# Patient Record
Sex: Female | Born: 1975 | Race: Black or African American | Hispanic: No | Marital: Single | Smoking: Former smoker
Health system: Southern US, Community
[De-identification: ages and names within clinical notes are randomized; demographics above are authoritative.]

## PROBLEM LIST (undated history)

## (undated) DIAGNOSIS — F319 Bipolar disorder, unspecified: Secondary | ICD-10-CM

## (undated) DIAGNOSIS — F419 Anxiety disorder, unspecified: Secondary | ICD-10-CM

## (undated) DIAGNOSIS — Z5189 Encounter for other specified aftercare: Secondary | ICD-10-CM

## (undated) DIAGNOSIS — IMO0001 Reserved for inherently not codable concepts without codable children: Secondary | ICD-10-CM

## (undated) DIAGNOSIS — Z8709 Personal history of other diseases of the respiratory system: Secondary | ICD-10-CM

## (undated) DIAGNOSIS — R7303 Prediabetes: Secondary | ICD-10-CM

## (undated) DIAGNOSIS — G43909 Migraine, unspecified, not intractable, without status migrainosus: Secondary | ICD-10-CM

## (undated) DIAGNOSIS — I10 Essential (primary) hypertension: Secondary | ICD-10-CM

## (undated) DIAGNOSIS — F32A Depression, unspecified: Secondary | ICD-10-CM

## (undated) DIAGNOSIS — D649 Anemia, unspecified: Secondary | ICD-10-CM

## (undated) DIAGNOSIS — J45909 Unspecified asthma, uncomplicated: Secondary | ICD-10-CM

## (undated) DIAGNOSIS — K219 Gastro-esophageal reflux disease without esophagitis: Secondary | ICD-10-CM

## (undated) DIAGNOSIS — R600 Localized edema: Secondary | ICD-10-CM

## (undated) DIAGNOSIS — N200 Calculus of kidney: Secondary | ICD-10-CM

## (undated) DIAGNOSIS — F329 Major depressive disorder, single episode, unspecified: Secondary | ICD-10-CM

## (undated) DIAGNOSIS — R079 Chest pain, unspecified: Secondary | ICD-10-CM

## (undated) DIAGNOSIS — F209 Schizophrenia, unspecified: Secondary | ICD-10-CM

## (undated) DIAGNOSIS — R0602 Shortness of breath: Secondary | ICD-10-CM

## (undated) HISTORY — DX: Prediabetes: R73.03

## (undated) HISTORY — PX: ENDOMETRIAL ABLATION: SHX621

## (undated) HISTORY — DX: Shortness of breath: R06.02

## (undated) HISTORY — DX: Unspecified asthma, uncomplicated: J45.909

## (undated) HISTORY — PX: FRACTURE SURGERY: SHX138

## (undated) HISTORY — DX: Anemia, unspecified: D64.9

## (undated) HISTORY — DX: Localized edema: R60.0

## (undated) HISTORY — PX: OTHER SURGICAL HISTORY: SHX169

## (undated) HISTORY — DX: Schizophrenia, unspecified: F20.9

## (undated) HISTORY — DX: Chest pain, unspecified: R07.9

---
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
## 1997-07-21 ENCOUNTER — Ambulatory Visit (HOSPITAL_COMMUNITY): Admission: RE | Admit: 1997-07-21 | Discharge: 1997-07-21 | Payer: Self-pay | Admitting: Obstetrics

## 1997-10-06 ENCOUNTER — Ambulatory Visit (HOSPITAL_COMMUNITY): Admission: RE | Admit: 1997-10-06 | Discharge: 1997-10-06 | Payer: Self-pay | Admitting: Obstetrics

## 1997-11-25 ENCOUNTER — Inpatient Hospital Stay (HOSPITAL_COMMUNITY): Admission: AD | Admit: 1997-11-25 | Discharge: 1997-11-25 | Payer: Self-pay | Admitting: Obstetrics

## 1997-12-16 ENCOUNTER — Inpatient Hospital Stay (HOSPITAL_COMMUNITY): Admission: AD | Admit: 1997-12-16 | Discharge: 1997-12-16 | Payer: Self-pay | Admitting: Obstetrics

## 1997-12-28 ENCOUNTER — Ambulatory Visit (HOSPITAL_COMMUNITY): Admission: RE | Admit: 1997-12-28 | Discharge: 1997-12-28 | Payer: Self-pay | Admitting: Obstetrics

## 1998-01-11 ENCOUNTER — Inpatient Hospital Stay (HOSPITAL_COMMUNITY): Admission: AD | Admit: 1998-01-11 | Discharge: 1998-01-11 | Payer: Self-pay | Admitting: Obstetrics

## 1998-02-10 ENCOUNTER — Inpatient Hospital Stay (HOSPITAL_COMMUNITY): Admission: AD | Admit: 1998-02-10 | Discharge: 1998-02-10 | Payer: Self-pay | Admitting: Obstetrics

## 1998-03-10 ENCOUNTER — Inpatient Hospital Stay (HOSPITAL_COMMUNITY): Admission: AD | Admit: 1998-03-10 | Discharge: 1998-03-10 | Payer: Self-pay | Admitting: Obstetrics

## 1998-03-18 ENCOUNTER — Inpatient Hospital Stay (HOSPITAL_COMMUNITY): Admission: AD | Admit: 1998-03-18 | Discharge: 1998-03-18 | Payer: Self-pay | Admitting: Obstetrics

## 1998-03-20 ENCOUNTER — Inpatient Hospital Stay (HOSPITAL_COMMUNITY): Admission: AD | Admit: 1998-03-20 | Discharge: 1998-03-22 | Payer: Self-pay | Admitting: Obstetrics

## 1998-09-01 ENCOUNTER — Inpatient Hospital Stay (HOSPITAL_COMMUNITY): Admission: AD | Admit: 1998-09-01 | Discharge: 1998-09-01 | Payer: Self-pay | Admitting: Obstetrics

## 1998-09-02 ENCOUNTER — Inpatient Hospital Stay (HOSPITAL_COMMUNITY): Admission: AD | Admit: 1998-09-02 | Discharge: 1998-09-02 | Payer: Self-pay | Admitting: Obstetrics

## 1998-11-12 ENCOUNTER — Inpatient Hospital Stay (HOSPITAL_COMMUNITY): Admission: EM | Admit: 1998-11-12 | Discharge: 1998-11-12 | Payer: Self-pay | Admitting: Obstetrics

## 1998-11-27 ENCOUNTER — Emergency Department (HOSPITAL_COMMUNITY): Admission: EM | Admit: 1998-11-27 | Discharge: 1998-11-27 | Payer: Self-pay | Admitting: Emergency Medicine

## 1998-12-06 ENCOUNTER — Inpatient Hospital Stay (HOSPITAL_COMMUNITY): Admission: AD | Admit: 1998-12-06 | Discharge: 1998-12-06 | Payer: Self-pay | Admitting: Obstetrics

## 1999-02-03 ENCOUNTER — Encounter: Payer: Self-pay | Admitting: Emergency Medicine

## 1999-02-03 ENCOUNTER — Emergency Department (HOSPITAL_COMMUNITY): Admission: EM | Admit: 1999-02-03 | Discharge: 1999-02-03 | Payer: Self-pay | Admitting: Emergency Medicine

## 1999-03-12 ENCOUNTER — Inpatient Hospital Stay (HOSPITAL_COMMUNITY): Admission: AD | Admit: 1999-03-12 | Discharge: 1999-03-12 | Payer: Self-pay | Admitting: *Deleted

## 2000-10-09 ENCOUNTER — Inpatient Hospital Stay (HOSPITAL_COMMUNITY): Admission: AD | Admit: 2000-10-09 | Discharge: 2000-10-09 | Payer: Self-pay | Admitting: *Deleted

## 2000-10-25 ENCOUNTER — Emergency Department (HOSPITAL_COMMUNITY): Admission: EM | Admit: 2000-10-25 | Discharge: 2000-10-25 | Payer: Self-pay | Admitting: Emergency Medicine

## 2001-01-12 ENCOUNTER — Emergency Department (HOSPITAL_COMMUNITY): Admission: EM | Admit: 2001-01-12 | Discharge: 2001-01-12 | Payer: Self-pay | Admitting: Emergency Medicine

## 2001-04-04 ENCOUNTER — Other Ambulatory Visit: Admission: RE | Admit: 2001-04-04 | Discharge: 2001-04-04 | Payer: Self-pay | Admitting: Obstetrics

## 2001-04-04 ENCOUNTER — Encounter: Admission: RE | Admit: 2001-04-04 | Discharge: 2001-04-04 | Payer: Self-pay | Admitting: Obstetrics

## 2001-04-11 ENCOUNTER — Ambulatory Visit (HOSPITAL_COMMUNITY): Admission: RE | Admit: 2001-04-11 | Discharge: 2001-04-11 | Payer: Self-pay

## 2001-05-07 ENCOUNTER — Emergency Department (HOSPITAL_COMMUNITY): Admission: EM | Admit: 2001-05-07 | Discharge: 2001-05-07 | Payer: Self-pay | Admitting: Emergency Medicine

## 2001-05-07 ENCOUNTER — Encounter: Payer: Self-pay | Admitting: Emergency Medicine

## 2001-06-20 ENCOUNTER — Emergency Department (HOSPITAL_COMMUNITY): Admission: EM | Admit: 2001-06-20 | Discharge: 2001-06-21 | Payer: Self-pay | Admitting: Emergency Medicine

## 2001-06-21 ENCOUNTER — Encounter: Payer: Self-pay | Admitting: Emergency Medicine

## 2001-07-12 ENCOUNTER — Encounter: Admission: RE | Admit: 2001-07-12 | Discharge: 2001-07-12 | Payer: Self-pay | Admitting: Family Medicine

## 2001-07-22 ENCOUNTER — Encounter: Payer: Self-pay | Admitting: Emergency Medicine

## 2001-07-22 ENCOUNTER — Emergency Department (HOSPITAL_COMMUNITY): Admission: EM | Admit: 2001-07-22 | Discharge: 2001-07-22 | Payer: Self-pay | Admitting: Emergency Medicine

## 2001-08-07 ENCOUNTER — Encounter: Admission: RE | Admit: 2001-08-07 | Discharge: 2001-08-07 | Payer: Self-pay | Admitting: Family Medicine

## 2001-08-27 ENCOUNTER — Encounter: Admission: RE | Admit: 2001-08-27 | Discharge: 2001-09-30 | Payer: Self-pay | Admitting: Orthopedic Surgery

## 2001-10-08 ENCOUNTER — Inpatient Hospital Stay (HOSPITAL_COMMUNITY): Admission: AD | Admit: 2001-10-08 | Discharge: 2001-10-08 | Payer: Self-pay | Admitting: *Deleted

## 2001-10-24 ENCOUNTER — Emergency Department (HOSPITAL_COMMUNITY): Admission: EM | Admit: 2001-10-24 | Discharge: 2001-10-24 | Payer: Self-pay | Admitting: Emergency Medicine

## 2001-10-24 ENCOUNTER — Encounter: Admission: RE | Admit: 2001-10-24 | Discharge: 2001-10-24 | Payer: Self-pay | Admitting: Family Medicine

## 2001-10-31 ENCOUNTER — Encounter: Admission: RE | Admit: 2001-10-31 | Discharge: 2001-10-31 | Payer: Self-pay | Admitting: Family Medicine

## 2001-10-31 ENCOUNTER — Other Ambulatory Visit: Admission: RE | Admit: 2001-10-31 | Discharge: 2001-10-31 | Payer: Self-pay | Admitting: Family Medicine

## 2001-11-01 ENCOUNTER — Inpatient Hospital Stay: Admission: AD | Admit: 2001-11-01 | Discharge: 2001-11-01 | Payer: Self-pay | Admitting: *Deleted

## 2001-11-05 ENCOUNTER — Ambulatory Visit (HOSPITAL_COMMUNITY): Admission: RE | Admit: 2001-11-05 | Discharge: 2001-11-05 | Payer: Self-pay | Admitting: Internal Medicine

## 2001-11-20 ENCOUNTER — Encounter: Admission: RE | Admit: 2001-11-20 | Discharge: 2001-11-20 | Payer: Self-pay | Admitting: Family Medicine

## 2001-11-24 ENCOUNTER — Inpatient Hospital Stay (HOSPITAL_COMMUNITY): Admission: AD | Admit: 2001-11-24 | Discharge: 2001-11-24 | Payer: Self-pay | Admitting: Obstetrics and Gynecology

## 2001-12-02 ENCOUNTER — Inpatient Hospital Stay (HOSPITAL_COMMUNITY): Admission: AD | Admit: 2001-12-02 | Discharge: 2001-12-02 | Payer: Self-pay | Admitting: *Deleted

## 2001-12-04 ENCOUNTER — Encounter: Admission: RE | Admit: 2001-12-04 | Discharge: 2001-12-04 | Payer: Self-pay | Admitting: Family Medicine

## 2001-12-24 ENCOUNTER — Inpatient Hospital Stay (HOSPITAL_COMMUNITY): Admission: AD | Admit: 2001-12-24 | Discharge: 2001-12-24 | Payer: Self-pay | Admitting: *Deleted

## 2002-01-09 ENCOUNTER — Encounter: Admission: RE | Admit: 2002-01-09 | Discharge: 2002-01-09 | Payer: Self-pay | Admitting: Family Medicine

## 2002-01-09 ENCOUNTER — Ambulatory Visit (HOSPITAL_COMMUNITY): Admission: RE | Admit: 2002-01-09 | Discharge: 2002-01-09 | Payer: Self-pay | Admitting: Family Medicine

## 2002-02-11 ENCOUNTER — Encounter: Admission: RE | Admit: 2002-02-11 | Discharge: 2002-02-11 | Payer: Self-pay | Admitting: Family Medicine

## 2002-03-09 ENCOUNTER — Inpatient Hospital Stay (HOSPITAL_COMMUNITY): Admission: AD | Admit: 2002-03-09 | Discharge: 2002-03-09 | Payer: Self-pay | Admitting: Obstetrics and Gynecology

## 2002-03-18 ENCOUNTER — Encounter: Admission: RE | Admit: 2002-03-18 | Discharge: 2002-03-18 | Payer: Self-pay | Admitting: Sports Medicine

## 2002-04-04 ENCOUNTER — Encounter: Admission: RE | Admit: 2002-04-04 | Discharge: 2002-04-04 | Payer: Self-pay | Admitting: Family Medicine

## 2002-04-08 ENCOUNTER — Inpatient Hospital Stay (HOSPITAL_COMMUNITY): Admission: AD | Admit: 2002-04-08 | Discharge: 2002-04-08 | Payer: Self-pay | Admitting: *Deleted

## 2002-04-08 ENCOUNTER — Ambulatory Visit (HOSPITAL_COMMUNITY): Admission: RE | Admit: 2002-04-08 | Discharge: 2002-04-08 | Payer: Self-pay | Admitting: Emergency Medicine

## 2002-04-16 ENCOUNTER — Encounter: Admission: RE | Admit: 2002-04-16 | Discharge: 2002-04-16 | Payer: Self-pay | Admitting: Family Medicine

## 2002-04-28 ENCOUNTER — Encounter: Admission: RE | Admit: 2002-04-28 | Discharge: 2002-04-28 | Payer: Self-pay | Admitting: Family Medicine

## 2002-05-13 ENCOUNTER — Encounter: Admission: RE | Admit: 2002-05-13 | Discharge: 2002-05-13 | Payer: Self-pay | Admitting: Family Medicine

## 2002-05-13 ENCOUNTER — Encounter: Payer: Self-pay | Admitting: *Deleted

## 2002-05-13 ENCOUNTER — Inpatient Hospital Stay (HOSPITAL_COMMUNITY): Admission: AD | Admit: 2002-05-13 | Discharge: 2002-05-17 | Payer: Self-pay | Admitting: *Deleted

## 2002-05-23 ENCOUNTER — Encounter: Admission: RE | Admit: 2002-05-23 | Discharge: 2002-05-23 | Payer: Self-pay | Admitting: Family Medicine

## 2002-05-30 ENCOUNTER — Encounter: Admission: RE | Admit: 2002-05-30 | Discharge: 2002-05-30 | Payer: Self-pay | Admitting: Family Medicine

## 2002-06-03 ENCOUNTER — Encounter: Admission: RE | Admit: 2002-06-03 | Discharge: 2002-06-03 | Payer: Self-pay | Admitting: Sports Medicine

## 2002-06-07 ENCOUNTER — Inpatient Hospital Stay (HOSPITAL_COMMUNITY): Admission: AD | Admit: 2002-06-07 | Discharge: 2002-06-07 | Payer: Self-pay | Admitting: Family Medicine

## 2002-06-09 ENCOUNTER — Inpatient Hospital Stay (HOSPITAL_COMMUNITY): Admission: AD | Admit: 2002-06-09 | Discharge: 2002-06-09 | Payer: Self-pay | Admitting: *Deleted

## 2002-06-11 ENCOUNTER — Inpatient Hospital Stay (HOSPITAL_COMMUNITY): Admission: AD | Admit: 2002-06-11 | Discharge: 2002-06-11 | Payer: Self-pay | Admitting: Obstetrics and Gynecology

## 2002-06-11 ENCOUNTER — Encounter: Payer: Self-pay | Admitting: Obstetrics and Gynecology

## 2002-06-12 ENCOUNTER — Inpatient Hospital Stay (HOSPITAL_COMMUNITY): Admission: AD | Admit: 2002-06-12 | Discharge: 2002-06-14 | Payer: Self-pay | Admitting: Obstetrics and Gynecology

## 2002-07-21 ENCOUNTER — Encounter: Admission: RE | Admit: 2002-07-21 | Discharge: 2002-07-21 | Payer: Self-pay | Admitting: Family Medicine

## 2002-08-04 ENCOUNTER — Encounter: Admission: RE | Admit: 2002-08-04 | Discharge: 2002-08-04 | Payer: Self-pay | Admitting: *Deleted

## 2002-08-07 ENCOUNTER — Ambulatory Visit (HOSPITAL_COMMUNITY): Admission: RE | Admit: 2002-08-07 | Discharge: 2002-08-07 | Payer: Self-pay | Admitting: Sports Medicine

## 2002-08-07 ENCOUNTER — Encounter: Payer: Self-pay | Admitting: Sports Medicine

## 2002-08-11 ENCOUNTER — Encounter: Admission: RE | Admit: 2002-08-11 | Discharge: 2002-08-11 | Payer: Self-pay | Admitting: Family Medicine

## 2002-09-05 ENCOUNTER — Encounter: Admission: RE | Admit: 2002-09-05 | Discharge: 2002-09-05 | Payer: Self-pay | Admitting: Family Medicine

## 2002-09-17 ENCOUNTER — Emergency Department (HOSPITAL_COMMUNITY): Admission: AD | Admit: 2002-09-17 | Discharge: 2002-09-17 | Payer: Self-pay | Admitting: Emergency Medicine

## 2002-12-02 ENCOUNTER — Encounter: Admission: RE | Admit: 2002-12-02 | Discharge: 2002-12-02 | Payer: Self-pay | Admitting: Family Medicine

## 2002-12-05 ENCOUNTER — Ambulatory Visit (HOSPITAL_COMMUNITY): Admission: RE | Admit: 2002-12-05 | Discharge: 2002-12-05 | Payer: Self-pay | Admitting: *Deleted

## 2002-12-05 ENCOUNTER — Encounter: Payer: Self-pay | Admitting: *Deleted

## 2002-12-17 ENCOUNTER — Encounter: Admission: RE | Admit: 2002-12-17 | Discharge: 2002-12-17 | Payer: Self-pay | Admitting: Family Medicine

## 2002-12-25 ENCOUNTER — Encounter: Payer: Self-pay | Admitting: Internal Medicine

## 2002-12-25 ENCOUNTER — Ambulatory Visit (HOSPITAL_COMMUNITY): Admission: RE | Admit: 2002-12-25 | Discharge: 2002-12-25 | Payer: Self-pay | Admitting: Internal Medicine

## 2002-12-30 ENCOUNTER — Encounter: Admission: RE | Admit: 2002-12-30 | Discharge: 2002-12-30 | Payer: Self-pay | Admitting: Family Medicine

## 2002-12-31 ENCOUNTER — Encounter: Admission: RE | Admit: 2002-12-31 | Discharge: 2002-12-31 | Payer: Self-pay | Admitting: Family Medicine

## 2003-02-01 ENCOUNTER — Emergency Department (HOSPITAL_COMMUNITY): Admission: EM | Admit: 2003-02-01 | Discharge: 2003-02-01 | Payer: Self-pay | Admitting: Emergency Medicine

## 2003-06-17 ENCOUNTER — Emergency Department (HOSPITAL_COMMUNITY): Admission: AD | Admit: 2003-06-17 | Discharge: 2003-06-17 | Payer: Self-pay | Admitting: Family Medicine

## 2003-09-06 ENCOUNTER — Emergency Department (HOSPITAL_COMMUNITY): Admission: EM | Admit: 2003-09-06 | Discharge: 2003-09-06 | Payer: Self-pay | Admitting: Emergency Medicine

## 2003-09-07 ENCOUNTER — Ambulatory Visit (HOSPITAL_COMMUNITY): Admission: RE | Admit: 2003-09-07 | Discharge: 2003-09-07 | Payer: Self-pay | Admitting: Otolaryngology

## 2003-10-16 ENCOUNTER — Other Ambulatory Visit: Admission: RE | Admit: 2003-10-16 | Discharge: 2003-10-16 | Payer: Self-pay | Admitting: Family Medicine

## 2003-10-16 ENCOUNTER — Encounter: Admission: RE | Admit: 2003-10-16 | Discharge: 2003-10-16 | Payer: Self-pay | Admitting: Family Medicine

## 2003-11-16 ENCOUNTER — Encounter: Admission: RE | Admit: 2003-11-16 | Discharge: 2003-11-16 | Payer: Self-pay | Admitting: Family Medicine

## 2003-12-22 ENCOUNTER — Encounter: Admission: RE | Admit: 2003-12-22 | Discharge: 2003-12-22 | Payer: Self-pay | Admitting: Family Medicine

## 2003-12-24 ENCOUNTER — Encounter: Admission: RE | Admit: 2003-12-24 | Discharge: 2003-12-24 | Payer: Self-pay | Admitting: Family Medicine

## 2004-01-13 ENCOUNTER — Encounter: Admission: RE | Admit: 2004-01-13 | Discharge: 2004-01-13 | Payer: Self-pay | Admitting: Family Medicine

## 2004-04-02 ENCOUNTER — Emergency Department (HOSPITAL_COMMUNITY): Admission: EM | Admit: 2004-04-02 | Discharge: 2004-04-02 | Payer: Self-pay | Admitting: Emergency Medicine

## 2004-04-04 ENCOUNTER — Emergency Department (HOSPITAL_COMMUNITY): Admission: EM | Admit: 2004-04-04 | Discharge: 2004-04-05 | Payer: Self-pay | Admitting: Emergency Medicine

## 2004-05-13 ENCOUNTER — Ambulatory Visit: Payer: Self-pay | Admitting: Family Medicine

## 2004-05-15 ENCOUNTER — Emergency Department (HOSPITAL_COMMUNITY): Admission: EM | Admit: 2004-05-15 | Discharge: 2004-05-15 | Payer: Self-pay | Admitting: Emergency Medicine

## 2004-05-24 ENCOUNTER — Inpatient Hospital Stay (HOSPITAL_COMMUNITY): Admission: AD | Admit: 2004-05-24 | Discharge: 2004-05-24 | Payer: Self-pay | Admitting: *Deleted

## 2004-06-07 ENCOUNTER — Emergency Department (HOSPITAL_COMMUNITY): Admission: EM | Admit: 2004-06-07 | Discharge: 2004-06-07 | Payer: Self-pay | Admitting: Emergency Medicine

## 2004-06-08 ENCOUNTER — Ambulatory Visit: Payer: Self-pay | Admitting: Sports Medicine

## 2004-06-12 HISTORY — PX: TUBAL LIGATION: SHX77

## 2004-06-26 ENCOUNTER — Emergency Department (HOSPITAL_COMMUNITY): Admission: EM | Admit: 2004-06-26 | Discharge: 2004-06-26 | Payer: Self-pay | Admitting: Family Medicine

## 2004-06-29 ENCOUNTER — Ambulatory Visit: Payer: Self-pay | Admitting: Family Medicine

## 2004-07-14 ENCOUNTER — Ambulatory Visit: Payer: Self-pay | Admitting: Family Medicine

## 2004-07-26 ENCOUNTER — Ambulatory Visit: Payer: Self-pay | Admitting: Sports Medicine

## 2004-07-27 ENCOUNTER — Ambulatory Visit (HOSPITAL_COMMUNITY): Admission: RE | Admit: 2004-07-27 | Discharge: 2004-07-27 | Payer: Self-pay | Admitting: Family Medicine

## 2004-08-09 ENCOUNTER — Ambulatory Visit: Payer: Self-pay | Admitting: Family Medicine

## 2004-08-15 ENCOUNTER — Encounter: Admission: RE | Admit: 2004-08-15 | Discharge: 2004-11-13 | Payer: Self-pay | Admitting: Unknown Physician Specialty

## 2004-09-14 ENCOUNTER — Ambulatory Visit: Payer: Self-pay | Admitting: Specialist

## 2004-09-14 ENCOUNTER — Inpatient Hospital Stay (HOSPITAL_COMMUNITY): Admission: RE | Admit: 2004-09-14 | Discharge: 2004-09-20 | Payer: Self-pay | Admitting: Psychiatry

## 2004-09-14 ENCOUNTER — Ambulatory Visit: Payer: Self-pay | Admitting: Psychiatry

## 2004-09-17 ENCOUNTER — Encounter: Payer: Self-pay | Admitting: *Deleted

## 2004-10-06 ENCOUNTER — Ambulatory Visit: Payer: Self-pay | Admitting: Family Medicine

## 2004-10-13 ENCOUNTER — Ambulatory Visit: Payer: Self-pay | Admitting: Family Medicine

## 2004-10-17 ENCOUNTER — Ambulatory Visit (HOSPITAL_COMMUNITY): Admission: RE | Admit: 2004-10-17 | Discharge: 2004-10-17 | Payer: Self-pay | Admitting: *Deleted

## 2004-10-20 ENCOUNTER — Emergency Department (HOSPITAL_COMMUNITY): Admission: EM | Admit: 2004-10-20 | Discharge: 2004-10-20 | Payer: Self-pay | Admitting: Family Medicine

## 2004-10-27 ENCOUNTER — Inpatient Hospital Stay (HOSPITAL_COMMUNITY): Admission: AD | Admit: 2004-10-27 | Discharge: 2004-10-27 | Payer: Self-pay | Admitting: Obstetrics & Gynecology

## 2004-11-03 ENCOUNTER — Ambulatory Visit: Payer: Self-pay | Admitting: *Deleted

## 2004-11-23 ENCOUNTER — Ambulatory Visit: Payer: Self-pay | Admitting: *Deleted

## 2004-12-21 ENCOUNTER — Ambulatory Visit: Payer: Self-pay | Admitting: *Deleted

## 2004-12-22 ENCOUNTER — Ambulatory Visit (HOSPITAL_COMMUNITY): Admission: RE | Admit: 2004-12-22 | Discharge: 2004-12-22 | Payer: Self-pay | Admitting: *Deleted

## 2004-12-28 ENCOUNTER — Ambulatory Visit: Payer: Self-pay | Admitting: Obstetrics & Gynecology

## 2005-01-04 ENCOUNTER — Ambulatory Visit (HOSPITAL_COMMUNITY): Admission: RE | Admit: 2005-01-04 | Discharge: 2005-01-04 | Payer: Self-pay | Admitting: Obstetrics & Gynecology

## 2005-01-08 ENCOUNTER — Ambulatory Visit: Payer: Self-pay | Admitting: Certified Nurse Midwife

## 2005-01-08 ENCOUNTER — Ambulatory Visit: Payer: Self-pay | Admitting: *Deleted

## 2005-01-08 ENCOUNTER — Inpatient Hospital Stay (HOSPITAL_COMMUNITY): Admission: AD | Admit: 2005-01-08 | Discharge: 2005-01-08 | Payer: Self-pay | Admitting: Obstetrics and Gynecology

## 2005-01-18 ENCOUNTER — Ambulatory Visit: Payer: Self-pay | Admitting: Obstetrics & Gynecology

## 2005-01-25 ENCOUNTER — Ambulatory Visit: Payer: Self-pay | Admitting: Obstetrics & Gynecology

## 2005-01-31 ENCOUNTER — Ambulatory Visit (HOSPITAL_COMMUNITY): Admission: RE | Admit: 2005-01-31 | Discharge: 2005-01-31 | Payer: Self-pay | Admitting: Obstetrics and Gynecology

## 2005-02-01 ENCOUNTER — Ambulatory Visit: Payer: Self-pay | Admitting: Obstetrics & Gynecology

## 2005-02-08 ENCOUNTER — Ambulatory Visit: Payer: Self-pay | Admitting: *Deleted

## 2005-02-15 ENCOUNTER — Ambulatory Visit: Payer: Self-pay | Admitting: *Deleted

## 2005-02-22 ENCOUNTER — Ambulatory Visit: Payer: Self-pay | Admitting: Obstetrics & Gynecology

## 2005-03-01 ENCOUNTER — Ambulatory Visit (HOSPITAL_COMMUNITY): Admission: RE | Admit: 2005-03-01 | Discharge: 2005-03-01 | Payer: Self-pay | Admitting: Obstetrics and Gynecology

## 2005-03-01 ENCOUNTER — Ambulatory Visit: Payer: Self-pay | Admitting: Obstetrics & Gynecology

## 2005-03-08 ENCOUNTER — Ambulatory Visit: Payer: Self-pay | Admitting: *Deleted

## 2005-03-10 ENCOUNTER — Inpatient Hospital Stay (HOSPITAL_COMMUNITY): Admission: AD | Admit: 2005-03-10 | Discharge: 2005-03-13 | Payer: Self-pay | Admitting: *Deleted

## 2005-03-10 ENCOUNTER — Ambulatory Visit: Payer: Self-pay | Admitting: Certified Nurse Midwife

## 2005-04-03 ENCOUNTER — Emergency Department (HOSPITAL_COMMUNITY): Admission: EM | Admit: 2005-04-03 | Discharge: 2005-04-03 | Payer: Self-pay | Admitting: Family Medicine

## 2005-04-13 ENCOUNTER — Ambulatory Visit: Payer: Self-pay | Admitting: Family Medicine

## 2005-04-20 ENCOUNTER — Emergency Department (HOSPITAL_COMMUNITY): Admission: EM | Admit: 2005-04-20 | Discharge: 2005-04-20 | Payer: Self-pay | Admitting: Emergency Medicine

## 2005-05-22 ENCOUNTER — Ambulatory Visit: Payer: Self-pay | Admitting: Sports Medicine

## 2005-06-01 ENCOUNTER — Other Ambulatory Visit: Admission: RE | Admit: 2005-06-01 | Discharge: 2005-06-01 | Payer: Self-pay | Admitting: Family Medicine

## 2005-06-01 ENCOUNTER — Ambulatory Visit: Payer: Self-pay | Admitting: Family Medicine

## 2005-06-18 ENCOUNTER — Encounter (INDEPENDENT_AMBULATORY_CARE_PROVIDER_SITE_OTHER): Payer: Self-pay | Admitting: *Deleted

## 2005-06-18 LAB — CONVERTED CEMR LAB

## 2005-06-21 ENCOUNTER — Ambulatory Visit: Payer: Self-pay | Admitting: Family Medicine

## 2005-06-27 ENCOUNTER — Emergency Department (HOSPITAL_COMMUNITY): Admission: EM | Admit: 2005-06-27 | Discharge: 2005-06-28 | Payer: Self-pay | Admitting: Emergency Medicine

## 2005-09-21 ENCOUNTER — Ambulatory Visit: Payer: Self-pay | Admitting: Family Medicine

## 2005-09-25 ENCOUNTER — Ambulatory Visit: Payer: Self-pay | Admitting: Sports Medicine

## 2005-09-26 ENCOUNTER — Inpatient Hospital Stay (HOSPITAL_COMMUNITY): Admission: RE | Admit: 2005-09-26 | Discharge: 2005-10-01 | Payer: Self-pay | Admitting: *Deleted

## 2005-09-26 ENCOUNTER — Ambulatory Visit: Payer: Self-pay | Admitting: *Deleted

## 2005-10-20 ENCOUNTER — Ambulatory Visit: Payer: Self-pay | Admitting: Family Medicine

## 2005-12-25 ENCOUNTER — Ambulatory Visit: Payer: Self-pay | Admitting: Family Medicine

## 2005-12-29 ENCOUNTER — Ambulatory Visit (HOSPITAL_COMMUNITY): Admission: RE | Admit: 2005-12-29 | Discharge: 2005-12-29 | Payer: Self-pay | Admitting: Family Medicine

## 2005-12-29 ENCOUNTER — Ambulatory Visit: Payer: Self-pay | Admitting: Family Medicine

## 2006-02-20 ENCOUNTER — Ambulatory Visit: Payer: Self-pay | Admitting: Sports Medicine

## 2006-04-10 ENCOUNTER — Emergency Department (HOSPITAL_COMMUNITY): Admission: EM | Admit: 2006-04-10 | Discharge: 2006-04-10 | Payer: Self-pay | Admitting: Emergency Medicine

## 2006-04-15 ENCOUNTER — Emergency Department (HOSPITAL_COMMUNITY): Admission: EM | Admit: 2006-04-15 | Discharge: 2006-04-15 | Payer: Self-pay | Admitting: Emergency Medicine

## 2006-07-11 ENCOUNTER — Ambulatory Visit: Payer: Self-pay | Admitting: Family Medicine

## 2006-08-09 DIAGNOSIS — Z6841 Body Mass Index (BMI) 40.0 and over, adult: Secondary | ICD-10-CM

## 2006-08-09 DIAGNOSIS — R51 Headache: Secondary | ICD-10-CM | POA: Insufficient documentation

## 2006-08-09 DIAGNOSIS — F339 Major depressive disorder, recurrent, unspecified: Secondary | ICD-10-CM | POA: Insufficient documentation

## 2006-08-09 DIAGNOSIS — R519 Headache, unspecified: Secondary | ICD-10-CM | POA: Insufficient documentation

## 2006-08-09 HISTORY — DX: Morbid (severe) obesity due to excess calories: E66.01

## 2006-08-10 ENCOUNTER — Encounter (INDEPENDENT_AMBULATORY_CARE_PROVIDER_SITE_OTHER): Payer: Self-pay | Admitting: *Deleted

## 2006-08-11 ENCOUNTER — Emergency Department (HOSPITAL_COMMUNITY): Admission: EM | Admit: 2006-08-11 | Discharge: 2006-08-12 | Payer: Self-pay | Admitting: Emergency Medicine

## 2006-08-17 ENCOUNTER — Ambulatory Visit: Payer: Self-pay | Admitting: Sports Medicine

## 2006-08-17 DIAGNOSIS — J329 Chronic sinusitis, unspecified: Secondary | ICD-10-CM | POA: Insufficient documentation

## 2006-08-17 DIAGNOSIS — E1159 Type 2 diabetes mellitus with other circulatory complications: Secondary | ICD-10-CM | POA: Insufficient documentation

## 2006-08-17 DIAGNOSIS — I10 Essential (primary) hypertension: Secondary | ICD-10-CM | POA: Insufficient documentation

## 2006-09-20 ENCOUNTER — Emergency Department (HOSPITAL_COMMUNITY): Admission: EM | Admit: 2006-09-20 | Discharge: 2006-09-20 | Payer: Self-pay | Admitting: Emergency Medicine

## 2006-10-03 ENCOUNTER — Telehealth: Payer: Self-pay | Admitting: *Deleted

## 2006-10-08 ENCOUNTER — Ambulatory Visit: Payer: Self-pay | Admitting: Family Medicine

## 2006-10-08 DIAGNOSIS — L219 Seborrheic dermatitis, unspecified: Secondary | ICD-10-CM | POA: Insufficient documentation

## 2006-11-12 ENCOUNTER — Telehealth: Payer: Self-pay | Admitting: *Deleted

## 2006-11-15 ENCOUNTER — Encounter (INDEPENDENT_AMBULATORY_CARE_PROVIDER_SITE_OTHER): Payer: Self-pay | Admitting: Family Medicine

## 2006-11-15 ENCOUNTER — Other Ambulatory Visit: Admission: RE | Admit: 2006-11-15 | Discharge: 2006-11-15 | Payer: Self-pay | Admitting: Family Medicine

## 2006-11-15 ENCOUNTER — Ambulatory Visit: Payer: Self-pay | Admitting: Family Medicine

## 2006-11-15 LAB — CONVERTED CEMR LAB
Bilirubin Urine: NEGATIVE
Blood in Urine, dipstick: NEGATIVE
Chlamydia, DNA Probe: NEGATIVE
Glucose, Urine, Semiquant: NEGATIVE
KOH Prep: NEGATIVE
Nitrite: NEGATIVE
Protein, U semiquant: NEGATIVE
Specific Gravity, Urine: >=1.03
Urobilinogen, UA: 0.2
WBC Urine, dipstick: NEGATIVE
Whiff Test: NEGATIVE
pH: 6

## 2006-11-19 ENCOUNTER — Telehealth (INDEPENDENT_AMBULATORY_CARE_PROVIDER_SITE_OTHER): Payer: Self-pay | Admitting: Family Medicine

## 2006-11-22 ENCOUNTER — Encounter (INDEPENDENT_AMBULATORY_CARE_PROVIDER_SITE_OTHER): Payer: Self-pay | Admitting: Family Medicine

## 2007-01-01 ENCOUNTER — Encounter (INDEPENDENT_AMBULATORY_CARE_PROVIDER_SITE_OTHER): Payer: Self-pay | Admitting: Family Medicine

## 2007-01-21 ENCOUNTER — Telehealth (INDEPENDENT_AMBULATORY_CARE_PROVIDER_SITE_OTHER): Payer: Self-pay | Admitting: *Deleted

## 2007-02-01 ENCOUNTER — Telehealth: Payer: Self-pay | Admitting: *Deleted

## 2007-02-01 ENCOUNTER — Ambulatory Visit: Payer: Self-pay | Admitting: Family Medicine

## 2007-02-01 ENCOUNTER — Encounter: Payer: Self-pay | Admitting: *Deleted

## 2007-04-16 ENCOUNTER — Ambulatory Visit: Payer: Self-pay | Admitting: Family Medicine

## 2007-05-18 ENCOUNTER — Telehealth (INDEPENDENT_AMBULATORY_CARE_PROVIDER_SITE_OTHER): Payer: Self-pay | Admitting: Family Medicine

## 2007-05-24 ENCOUNTER — Ambulatory Visit: Payer: Self-pay | Admitting: Family Medicine

## 2007-05-27 ENCOUNTER — Telehealth (INDEPENDENT_AMBULATORY_CARE_PROVIDER_SITE_OTHER): Payer: Self-pay | Admitting: Family Medicine

## 2007-06-10 ENCOUNTER — Telehealth (INDEPENDENT_AMBULATORY_CARE_PROVIDER_SITE_OTHER): Payer: Self-pay | Admitting: Family Medicine

## 2007-07-16 ENCOUNTER — Telehealth (INDEPENDENT_AMBULATORY_CARE_PROVIDER_SITE_OTHER): Payer: Self-pay | Admitting: Family Medicine

## 2007-09-27 ENCOUNTER — Encounter (INDEPENDENT_AMBULATORY_CARE_PROVIDER_SITE_OTHER): Payer: Self-pay | Admitting: Family Medicine

## 2007-09-27 ENCOUNTER — Ambulatory Visit: Payer: Self-pay | Admitting: Family Medicine

## 2007-09-27 LAB — CONVERTED CEMR LAB
BUN: 10 mg/dL (ref 6–23)
Creatinine, Ser: 0.91 mg/dL (ref 0.40–1.20)
Glucose, Bld: 138 mg/dL — ABNORMAL HIGH (ref 70–99)
Potassium: 4.3 meq/L (ref 3.5–5.3)
TSH: 1.06 microintl units/mL (ref 0.350–5.50)

## 2007-09-29 ENCOUNTER — Telehealth (INDEPENDENT_AMBULATORY_CARE_PROVIDER_SITE_OTHER): Payer: Self-pay | Admitting: Family Medicine

## 2007-10-01 ENCOUNTER — Encounter: Payer: Self-pay | Admitting: *Deleted

## 2007-10-02 ENCOUNTER — Encounter: Admission: RE | Admit: 2007-10-02 | Discharge: 2007-10-02 | Payer: Self-pay | Admitting: Family Medicine

## 2007-10-04 ENCOUNTER — Telehealth (INDEPENDENT_AMBULATORY_CARE_PROVIDER_SITE_OTHER): Payer: Self-pay | Admitting: Family Medicine

## 2007-10-11 ENCOUNTER — Telehealth: Payer: Self-pay | Admitting: *Deleted

## 2007-10-11 ENCOUNTER — Ambulatory Visit: Payer: Self-pay | Admitting: Family Medicine

## 2007-11-14 ENCOUNTER — Telehealth: Payer: Self-pay | Admitting: *Deleted

## 2007-11-21 ENCOUNTER — Ambulatory Visit: Payer: Self-pay | Admitting: Family Medicine

## 2007-11-21 ENCOUNTER — Encounter (INDEPENDENT_AMBULATORY_CARE_PROVIDER_SITE_OTHER): Payer: Self-pay | Admitting: *Deleted

## 2007-11-21 ENCOUNTER — Other Ambulatory Visit: Admission: RE | Admit: 2007-11-21 | Discharge: 2007-11-21 | Payer: Self-pay | Admitting: Family Medicine

## 2007-11-21 LAB — CONVERTED CEMR LAB
GC Probe Amp, Genital: NEGATIVE
HCT: 36 % (ref 36.0–46.0)
Hemoglobin: 10.9 g/dL — ABNORMAL LOW (ref 12.0–15.0)
MCV: 81.3 fL (ref 78.0–100.0)
RDW: 13.9 % (ref 11.5–15.5)
WBC: 11.1 10*3/uL — ABNORMAL HIGH (ref 4.0–10.5)

## 2007-11-25 ENCOUNTER — Ambulatory Visit (HOSPITAL_COMMUNITY): Admission: RE | Admit: 2007-11-25 | Discharge: 2007-11-25 | Payer: Self-pay | Admitting: *Deleted

## 2007-11-25 ENCOUNTER — Encounter (INDEPENDENT_AMBULATORY_CARE_PROVIDER_SITE_OTHER): Payer: Self-pay | Admitting: *Deleted

## 2007-11-28 ENCOUNTER — Telehealth (INDEPENDENT_AMBULATORY_CARE_PROVIDER_SITE_OTHER): Payer: Self-pay | Admitting: *Deleted

## 2007-12-04 ENCOUNTER — Telehealth (INDEPENDENT_AMBULATORY_CARE_PROVIDER_SITE_OTHER): Payer: Self-pay | Admitting: *Deleted

## 2007-12-05 ENCOUNTER — Telehealth (INDEPENDENT_AMBULATORY_CARE_PROVIDER_SITE_OTHER): Payer: Self-pay | Admitting: Family Medicine

## 2007-12-16 ENCOUNTER — Telehealth: Payer: Self-pay | Admitting: *Deleted

## 2007-12-16 ENCOUNTER — Ambulatory Visit: Payer: Self-pay | Admitting: Family Medicine

## 2008-01-05 ENCOUNTER — Emergency Department (HOSPITAL_COMMUNITY): Admission: EM | Admit: 2008-01-05 | Discharge: 2008-01-05 | Payer: Self-pay | Admitting: Emergency Medicine

## 2008-01-24 ENCOUNTER — Telehealth: Payer: Self-pay | Admitting: *Deleted

## 2008-01-24 ENCOUNTER — Encounter: Payer: Self-pay | Admitting: Family Medicine

## 2008-01-24 ENCOUNTER — Ambulatory Visit: Payer: Self-pay | Admitting: Family Medicine

## 2008-01-24 DIAGNOSIS — K219 Gastro-esophageal reflux disease without esophagitis: Secondary | ICD-10-CM | POA: Insufficient documentation

## 2008-01-24 LAB — CONVERTED CEMR LAB: Whiff Test: NEGATIVE

## 2008-01-26 ENCOUNTER — Encounter (INDEPENDENT_AMBULATORY_CARE_PROVIDER_SITE_OTHER): Payer: Self-pay | Admitting: *Deleted

## 2008-01-26 LAB — CONVERTED CEMR LAB: GC Probe Amp, Genital: NEGATIVE

## 2008-02-12 ENCOUNTER — Encounter (INDEPENDENT_AMBULATORY_CARE_PROVIDER_SITE_OTHER): Payer: Self-pay | Admitting: Family Medicine

## 2008-02-12 ENCOUNTER — Telehealth: Payer: Self-pay | Admitting: *Deleted

## 2008-02-12 ENCOUNTER — Ambulatory Visit: Payer: Self-pay | Admitting: Family Medicine

## 2008-02-13 LAB — CONVERTED CEMR LAB
ALT: 11 units/L (ref 0–35)
Alkaline Phosphatase: 49 units/L (ref 39–117)
Sodium: 143 meq/L (ref 135–145)
Total Bilirubin: 0.5 mg/dL (ref 0.3–1.2)
Total Protein: 7.3 g/dL (ref 6.0–8.3)

## 2008-04-02 ENCOUNTER — Telehealth: Payer: Self-pay | Admitting: *Deleted

## 2008-04-15 ENCOUNTER — Encounter (INDEPENDENT_AMBULATORY_CARE_PROVIDER_SITE_OTHER): Payer: Self-pay | Admitting: Family Medicine

## 2008-04-15 ENCOUNTER — Ambulatory Visit: Payer: Self-pay | Admitting: Family Medicine

## 2008-04-20 ENCOUNTER — Telehealth: Payer: Self-pay | Admitting: *Deleted

## 2008-04-20 ENCOUNTER — Telehealth (INDEPENDENT_AMBULATORY_CARE_PROVIDER_SITE_OTHER): Payer: Self-pay | Admitting: Family Medicine

## 2008-04-20 LAB — CONVERTED CEMR LAB
BUN: 9 mg/dL (ref 6–23)
CO2: 27 meq/L (ref 19–32)
Chloride: 101 meq/L (ref 96–112)
Glucose, Bld: 118 mg/dL — ABNORMAL HIGH (ref 70–99)
LDL Cholesterol: 98 mg/dL (ref 0–99)
Potassium: 4.2 meq/L (ref 3.5–5.3)
VLDL: 21 mg/dL (ref 0–40)

## 2008-04-24 ENCOUNTER — Encounter (INDEPENDENT_AMBULATORY_CARE_PROVIDER_SITE_OTHER): Payer: Self-pay | Admitting: Family Medicine

## 2008-04-24 ENCOUNTER — Ambulatory Visit: Payer: Self-pay | Admitting: Family Medicine

## 2008-04-25 ENCOUNTER — Telehealth (INDEPENDENT_AMBULATORY_CARE_PROVIDER_SITE_OTHER): Payer: Self-pay | Admitting: Family Medicine

## 2008-05-11 ENCOUNTER — Encounter (INDEPENDENT_AMBULATORY_CARE_PROVIDER_SITE_OTHER): Payer: Self-pay | Admitting: Family Medicine

## 2008-06-09 ENCOUNTER — Telehealth: Payer: Self-pay | Admitting: Family Medicine

## 2008-06-29 ENCOUNTER — Telehealth: Payer: Self-pay | Admitting: *Deleted

## 2008-07-19 ENCOUNTER — Telehealth (INDEPENDENT_AMBULATORY_CARE_PROVIDER_SITE_OTHER): Payer: Self-pay | Admitting: Family Medicine

## 2008-08-07 ENCOUNTER — Encounter (INDEPENDENT_AMBULATORY_CARE_PROVIDER_SITE_OTHER): Payer: Self-pay | Admitting: Family Medicine

## 2008-08-07 ENCOUNTER — Ambulatory Visit: Payer: Self-pay | Admitting: Family Medicine

## 2008-08-07 ENCOUNTER — Telehealth (INDEPENDENT_AMBULATORY_CARE_PROVIDER_SITE_OTHER): Payer: Self-pay | Admitting: *Deleted

## 2008-08-07 LAB — CONVERTED CEMR LAB
ALT: 12 units/L (ref 0–35)
AST: 16 units/L (ref 0–37)
Alkaline Phosphatase: 53 units/L (ref 39–117)
Beta hcg, urine, semiquantitative: NEGATIVE
Chloride: 104 meq/L (ref 96–112)
Creatinine, Ser: 0.73 mg/dL (ref 0.40–1.20)
HCT: 29.1 % — ABNORMAL LOW (ref 36.0–46.0)
MCHC: 26.8 g/dL — ABNORMAL LOW (ref 30.0–36.0)
MCV: 74.6 fL — ABNORMAL LOW (ref 78.0–100.0)
Platelets: 486 10*3/uL — ABNORMAL HIGH (ref 150–400)
RDW: 16.5 % — ABNORMAL HIGH (ref 11.5–15.5)
Total Bilirubin: 0.4 mg/dL (ref 0.3–1.2)

## 2008-08-08 ENCOUNTER — Ambulatory Visit: Payer: Self-pay | Admitting: Family Medicine

## 2008-08-08 ENCOUNTER — Inpatient Hospital Stay (HOSPITAL_COMMUNITY): Admission: EM | Admit: 2008-08-08 | Discharge: 2008-08-10 | Payer: Self-pay | Admitting: Pediatrics

## 2008-08-08 ENCOUNTER — Telehealth (INDEPENDENT_AMBULATORY_CARE_PROVIDER_SITE_OTHER): Payer: Self-pay | Admitting: Family Medicine

## 2008-08-08 ENCOUNTER — Encounter: Payer: Self-pay | Admitting: Family Medicine

## 2008-08-10 ENCOUNTER — Encounter: Payer: Self-pay | Admitting: Family Medicine

## 2008-08-10 ENCOUNTER — Telehealth: Payer: Self-pay | Admitting: Family Medicine

## 2008-08-11 ENCOUNTER — Encounter (INDEPENDENT_AMBULATORY_CARE_PROVIDER_SITE_OTHER): Payer: Self-pay | Admitting: Family Medicine

## 2008-08-13 ENCOUNTER — Telehealth (INDEPENDENT_AMBULATORY_CARE_PROVIDER_SITE_OTHER): Payer: Self-pay | Admitting: Family Medicine

## 2008-08-14 ENCOUNTER — Ambulatory Visit: Payer: Self-pay | Admitting: Family Medicine

## 2008-08-14 ENCOUNTER — Encounter (INDEPENDENT_AMBULATORY_CARE_PROVIDER_SITE_OTHER): Payer: Self-pay | Admitting: Family Medicine

## 2008-08-14 ENCOUNTER — Ambulatory Visit (HOSPITAL_COMMUNITY): Admission: RE | Admit: 2008-08-14 | Discharge: 2008-08-14 | Payer: Self-pay | Admitting: Family Medicine

## 2008-08-14 DIAGNOSIS — D649 Anemia, unspecified: Secondary | ICD-10-CM | POA: Insufficient documentation

## 2008-08-14 DIAGNOSIS — D509 Iron deficiency anemia, unspecified: Secondary | ICD-10-CM | POA: Insufficient documentation

## 2008-08-14 HISTORY — DX: Anemia, unspecified: D64.9

## 2008-08-17 LAB — CONVERTED CEMR LAB
HCT: 40.2 % (ref 36.0–46.0)
Hemoglobin: 11.2 g/dL — ABNORMAL LOW (ref 12.0–15.0)
MCHC: 27.9 g/dL — ABNORMAL LOW (ref 30.0–36.0)
RDW: 18.6 % — ABNORMAL HIGH (ref 11.5–15.5)

## 2008-08-20 ENCOUNTER — Encounter (INDEPENDENT_AMBULATORY_CARE_PROVIDER_SITE_OTHER): Payer: Self-pay | Admitting: Family Medicine

## 2008-08-24 ENCOUNTER — Telehealth (INDEPENDENT_AMBULATORY_CARE_PROVIDER_SITE_OTHER): Payer: Self-pay | Admitting: Family Medicine

## 2008-08-30 ENCOUNTER — Telehealth (INDEPENDENT_AMBULATORY_CARE_PROVIDER_SITE_OTHER): Payer: Self-pay | Admitting: Family Medicine

## 2008-08-31 ENCOUNTER — Ambulatory Visit: Payer: Self-pay | Admitting: Family Medicine

## 2008-08-31 ENCOUNTER — Encounter (INDEPENDENT_AMBULATORY_CARE_PROVIDER_SITE_OTHER): Payer: Self-pay | Admitting: Family Medicine

## 2008-08-31 DIAGNOSIS — J309 Allergic rhinitis, unspecified: Secondary | ICD-10-CM | POA: Insufficient documentation

## 2008-08-31 DIAGNOSIS — N939 Abnormal uterine and vaginal bleeding, unspecified: Secondary | ICD-10-CM

## 2008-08-31 DIAGNOSIS — N926 Irregular menstruation, unspecified: Secondary | ICD-10-CM | POA: Insufficient documentation

## 2008-08-31 LAB — CONVERTED CEMR LAB
MCHC: 30.3 g/dL (ref 30.0–36.0)
MCV: 80.1 fL (ref 78.0–100.0)
Platelets: 395 10*3/uL (ref 150–400)

## 2008-09-02 ENCOUNTER — Encounter: Payer: Self-pay | Admitting: Family Medicine

## 2008-09-02 ENCOUNTER — Encounter: Payer: Self-pay | Admitting: Obstetrics and Gynecology

## 2008-09-02 ENCOUNTER — Ambulatory Visit: Payer: Self-pay | Admitting: Obstetrics and Gynecology

## 2008-09-02 ENCOUNTER — Other Ambulatory Visit: Admission: RE | Admit: 2008-09-02 | Discharge: 2008-09-02 | Payer: Self-pay | Admitting: Obstetrics and Gynecology

## 2008-09-02 LAB — CONVERTED CEMR LAB
MCV: 80.6 fL (ref 78.0–100.0)
Platelets: 467 10*3/uL — ABNORMAL HIGH (ref 150–400)
RDW: 20.9 % — ABNORMAL HIGH (ref 11.5–15.5)
WBC: 9.1 10*3/uL (ref 4.0–10.5)

## 2008-09-03 ENCOUNTER — Telehealth: Payer: Self-pay | Admitting: Family Medicine

## 2008-10-01 ENCOUNTER — Telehealth (INDEPENDENT_AMBULATORY_CARE_PROVIDER_SITE_OTHER): Payer: Self-pay | Admitting: Family Medicine

## 2008-10-07 ENCOUNTER — Telehealth (INDEPENDENT_AMBULATORY_CARE_PROVIDER_SITE_OTHER): Payer: Self-pay | Admitting: Family Medicine

## 2008-10-19 ENCOUNTER — Telehealth (INDEPENDENT_AMBULATORY_CARE_PROVIDER_SITE_OTHER): Payer: Self-pay | Admitting: Family Medicine

## 2008-10-20 ENCOUNTER — Ambulatory Visit: Payer: Self-pay | Admitting: Obstetrics and Gynecology

## 2008-10-20 ENCOUNTER — Encounter: Payer: Self-pay | Admitting: Family Medicine

## 2008-10-20 LAB — CONVERTED CEMR LAB
HCT: 34.9 % — ABNORMAL LOW (ref 36.0–46.0)
Hemoglobin: 10.8 g/dL — ABNORMAL LOW (ref 12.0–15.0)
Platelets: 414 10*3/uL — ABNORMAL HIGH (ref 150–400)
WBC: 6.4 10*3/uL (ref 4.0–10.5)

## 2008-11-02 ENCOUNTER — Encounter (INDEPENDENT_AMBULATORY_CARE_PROVIDER_SITE_OTHER): Payer: Self-pay | Admitting: Family Medicine

## 2008-11-25 ENCOUNTER — Ambulatory Visit: Payer: Self-pay | Admitting: Obstetrics & Gynecology

## 2008-12-07 ENCOUNTER — Ambulatory Visit (HOSPITAL_COMMUNITY): Admission: RE | Admit: 2008-12-07 | Discharge: 2008-12-07 | Payer: Self-pay | Admitting: Family Medicine

## 2008-12-16 ENCOUNTER — Ambulatory Visit: Payer: Self-pay | Admitting: Obstetrics & Gynecology

## 2009-01-09 ENCOUNTER — Telehealth: Payer: Self-pay | Admitting: Family Medicine

## 2009-01-28 ENCOUNTER — Ambulatory Visit: Payer: Self-pay | Admitting: Obstetrics & Gynecology

## 2009-01-28 ENCOUNTER — Ambulatory Visit (HOSPITAL_COMMUNITY): Admission: RE | Admit: 2009-01-28 | Discharge: 2009-01-28 | Payer: Self-pay | Admitting: Obstetrics & Gynecology

## 2009-01-28 ENCOUNTER — Telehealth: Payer: Self-pay | Admitting: Family Medicine

## 2009-02-02 ENCOUNTER — Ambulatory Visit: Payer: Self-pay | Admitting: Family Medicine

## 2009-02-02 ENCOUNTER — Encounter: Payer: Self-pay | Admitting: Family Medicine

## 2009-02-02 DIAGNOSIS — Z9189 Other specified personal risk factors, not elsewhere classified: Secondary | ICD-10-CM | POA: Insufficient documentation

## 2009-02-02 DIAGNOSIS — R110 Nausea: Secondary | ICD-10-CM | POA: Insufficient documentation

## 2009-02-02 DIAGNOSIS — L608 Other nail disorders: Secondary | ICD-10-CM | POA: Insufficient documentation

## 2009-02-02 DIAGNOSIS — M25579 Pain in unspecified ankle and joints of unspecified foot: Secondary | ICD-10-CM | POA: Insufficient documentation

## 2009-02-03 ENCOUNTER — Inpatient Hospital Stay (HOSPITAL_COMMUNITY): Admission: AD | Admit: 2009-02-03 | Discharge: 2009-02-03 | Payer: Self-pay | Admitting: Obstetrics & Gynecology

## 2009-02-04 LAB — CONVERTED CEMR LAB
Alkaline Phosphatase: 57 units/L (ref 39–117)
Bilirubin, Direct: 0.1 mg/dL (ref 0.0–0.3)
Indirect Bilirubin: 0.6 mg/dL (ref 0.0–0.9)
Total Protein: 7.6 g/dL (ref 6.0–8.3)

## 2009-02-25 ENCOUNTER — Telehealth: Payer: Self-pay | Admitting: Family Medicine

## 2009-03-08 ENCOUNTER — Encounter: Payer: Self-pay | Admitting: Family Medicine

## 2009-05-29 ENCOUNTER — Telehealth: Payer: Self-pay | Admitting: Family Medicine

## 2009-06-08 ENCOUNTER — Emergency Department (HOSPITAL_COMMUNITY): Admission: EM | Admit: 2009-06-08 | Discharge: 2009-06-08 | Payer: Self-pay | Admitting: Emergency Medicine

## 2009-06-10 ENCOUNTER — Telehealth: Payer: Self-pay | Admitting: Family Medicine

## 2009-06-10 ENCOUNTER — Emergency Department (HOSPITAL_COMMUNITY): Admission: EM | Admit: 2009-06-10 | Discharge: 2009-06-10 | Payer: Self-pay | Admitting: Emergency Medicine

## 2009-06-14 ENCOUNTER — Ambulatory Visit (HOSPITAL_COMMUNITY): Admission: RE | Admit: 2009-06-14 | Discharge: 2009-06-14 | Payer: Self-pay | Admitting: Family Medicine

## 2009-06-14 ENCOUNTER — Telehealth (INDEPENDENT_AMBULATORY_CARE_PROVIDER_SITE_OTHER): Payer: Self-pay | Admitting: *Deleted

## 2009-06-14 ENCOUNTER — Encounter: Payer: Self-pay | Admitting: Family Medicine

## 2009-06-14 ENCOUNTER — Ambulatory Visit: Payer: Self-pay | Admitting: Family Medicine

## 2009-06-14 DIAGNOSIS — R300 Dysuria: Secondary | ICD-10-CM | POA: Insufficient documentation

## 2009-06-14 DIAGNOSIS — R0789 Other chest pain: Secondary | ICD-10-CM | POA: Insufficient documentation

## 2009-06-14 LAB — CONVERTED CEMR LAB
Bilirubin Urine: NEGATIVE
Blood in Urine, dipstick: NEGATIVE
Urobilinogen, UA: 0.2
pH: 5.5

## 2009-06-15 LAB — CONVERTED CEMR LAB
BUN: 10 mg/dL (ref 6–23)
CO2: 23 meq/L (ref 19–32)
Calcium: 9.1 mg/dL (ref 8.4–10.5)
Chloride: 100 meq/L (ref 96–112)
Creatinine, Ser: 0.79 mg/dL (ref 0.40–1.20)
Glucose, Bld: 108 mg/dL — ABNORMAL HIGH (ref 70–99)
HCT: 37.9 % (ref 36.0–46.0)
Hemoglobin: 11.8 g/dL — ABNORMAL LOW (ref 12.0–15.0)
MCV: 85.4 fL (ref 78.0–100.0)
Platelets: 365 10*3/uL (ref 150–400)
RBC: 4.44 M/uL (ref 3.87–5.11)
WBC: 7 10*3/uL (ref 4.0–10.5)

## 2009-06-24 ENCOUNTER — Telehealth: Payer: Self-pay | Admitting: Family Medicine

## 2009-07-16 ENCOUNTER — Encounter: Payer: Self-pay | Admitting: Family Medicine

## 2009-07-16 ENCOUNTER — Ambulatory Visit: Payer: Self-pay | Admitting: Family Medicine

## 2009-07-16 DIAGNOSIS — R141 Gas pain: Secondary | ICD-10-CM | POA: Insufficient documentation

## 2009-07-16 DIAGNOSIS — R142 Eructation: Secondary | ICD-10-CM

## 2009-07-16 DIAGNOSIS — R143 Flatulence: Secondary | ICD-10-CM

## 2009-07-16 LAB — CONVERTED CEMR LAB
AST: 18 units/L (ref 0–37)
Albumin: 3.8 g/dL (ref 3.5–5.2)
BUN: 12 mg/dL (ref 6–23)
CO2: 29 meq/L (ref 19–32)
Calcium: 8.8 mg/dL (ref 8.4–10.5)
Chloride: 103 meq/L (ref 96–112)
Glucose, Bld: 92 mg/dL (ref 70–99)
Potassium: 3.8 meq/L (ref 3.5–5.3)
TSH: 1.371 microintl units/mL (ref 0.350–4.500)

## 2009-07-19 ENCOUNTER — Telehealth: Payer: Self-pay | Admitting: Family Medicine

## 2009-07-23 ENCOUNTER — Telehealth: Payer: Self-pay | Admitting: Family Medicine

## 2009-08-09 ENCOUNTER — Encounter: Payer: Self-pay | Admitting: Family Medicine

## 2009-08-09 ENCOUNTER — Ambulatory Visit: Payer: Self-pay | Admitting: Family Medicine

## 2009-08-09 ENCOUNTER — Other Ambulatory Visit: Admission: RE | Admit: 2009-08-09 | Discharge: 2009-08-09 | Payer: Self-pay | Admitting: Family Medicine

## 2009-08-12 ENCOUNTER — Telehealth: Payer: Self-pay | Admitting: Family Medicine

## 2009-08-19 ENCOUNTER — Encounter: Payer: Self-pay | Admitting: Family Medicine

## 2009-10-11 ENCOUNTER — Encounter: Payer: Self-pay | Admitting: Family Medicine

## 2009-11-04 ENCOUNTER — Encounter: Payer: Self-pay | Admitting: Family Medicine

## 2009-11-04 ENCOUNTER — Ambulatory Visit: Payer: Self-pay | Admitting: Family Medicine

## 2009-11-04 DIAGNOSIS — L919 Hypertrophic disorder of the skin, unspecified: Secondary | ICD-10-CM

## 2009-11-04 DIAGNOSIS — R079 Chest pain, unspecified: Secondary | ICD-10-CM | POA: Insufficient documentation

## 2009-11-04 DIAGNOSIS — L909 Atrophic disorder of skin, unspecified: Secondary | ICD-10-CM | POA: Insufficient documentation

## 2009-11-04 DIAGNOSIS — R5383 Other fatigue: Secondary | ICD-10-CM

## 2009-11-04 DIAGNOSIS — R5381 Other malaise: Secondary | ICD-10-CM | POA: Insufficient documentation

## 2009-11-04 LAB — CONVERTED CEMR LAB
Eosinophils Absolute: 0.3 10*3/uL (ref 0.0–0.7)
Eosinophils Relative: 3 % (ref 0–5)
HCT: 39.7 % (ref 36.0–46.0)
Lymphs Abs: 3.2 10*3/uL (ref 0.7–4.0)
MCHC: 31.5 g/dL (ref 30.0–36.0)
MCV: 85.4 fL (ref 78.0–100.0)
Monocytes Relative: 6 % (ref 3–12)
Platelets: 383 10*3/uL (ref 150–400)
RBC: 4.65 M/uL (ref 3.87–5.11)
WBC: 7.8 10*3/uL (ref 4.0–10.5)

## 2009-11-09 ENCOUNTER — Encounter: Payer: Self-pay | Admitting: Family Medicine

## 2009-12-01 ENCOUNTER — Encounter: Payer: Self-pay | Admitting: Family Medicine

## 2009-12-30 ENCOUNTER — Encounter: Payer: Self-pay | Admitting: Family Medicine

## 2010-01-19 ENCOUNTER — Ambulatory Visit: Payer: Self-pay | Admitting: Family Medicine

## 2010-01-19 ENCOUNTER — Ambulatory Visit (HOSPITAL_COMMUNITY): Admission: RE | Admit: 2010-01-19 | Discharge: 2010-01-19 | Payer: Self-pay | Admitting: Family Medicine

## 2010-01-19 ENCOUNTER — Encounter: Payer: Self-pay | Admitting: Family Medicine

## 2010-01-19 DIAGNOSIS — B3731 Acute candidiasis of vulva and vagina: Secondary | ICD-10-CM | POA: Insufficient documentation

## 2010-01-19 DIAGNOSIS — B373 Candidiasis of vulva and vagina: Secondary | ICD-10-CM

## 2010-01-19 DIAGNOSIS — N760 Acute vaginitis: Secondary | ICD-10-CM | POA: Insufficient documentation

## 2010-01-19 DIAGNOSIS — B350 Tinea barbae and tinea capitis: Secondary | ICD-10-CM | POA: Insufficient documentation

## 2010-01-21 ENCOUNTER — Telehealth: Payer: Self-pay | Admitting: Family Medicine

## 2010-01-27 ENCOUNTER — Encounter: Payer: Self-pay | Admitting: Family Medicine

## 2010-02-27 ENCOUNTER — Telehealth: Payer: Self-pay | Admitting: Family Medicine

## 2010-03-06 ENCOUNTER — Encounter: Payer: Self-pay | Admitting: Family Medicine

## 2010-03-12 ENCOUNTER — Ambulatory Visit: Payer: Self-pay | Admitting: Nurse Practitioner

## 2010-03-12 ENCOUNTER — Inpatient Hospital Stay (HOSPITAL_COMMUNITY): Admission: AD | Admit: 2010-03-12 | Discharge: 2010-03-12 | Payer: Self-pay | Admitting: Obstetrics and Gynecology

## 2010-03-14 ENCOUNTER — Emergency Department (HOSPITAL_COMMUNITY): Admission: EM | Admit: 2010-03-14 | Discharge: 2010-03-14 | Payer: Self-pay | Admitting: Emergency Medicine

## 2010-03-14 ENCOUNTER — Telehealth: Payer: Self-pay | Admitting: Family Medicine

## 2010-03-14 ENCOUNTER — Encounter: Payer: Self-pay | Admitting: Family Medicine

## 2010-03-22 ENCOUNTER — Telehealth: Payer: Self-pay | Admitting: Family Medicine

## 2010-04-12 ENCOUNTER — Encounter: Payer: Self-pay | Admitting: Family Medicine

## 2010-05-03 ENCOUNTER — Ambulatory Visit: Payer: Self-pay | Admitting: Family Medicine

## 2010-06-15 ENCOUNTER — Ambulatory Visit: Admission: RE | Admit: 2010-06-15 | Discharge: 2010-06-15 | Payer: Self-pay | Source: Home / Self Care

## 2010-06-21 ENCOUNTER — Encounter: Payer: Self-pay | Admitting: Family Medicine

## 2010-06-28 ENCOUNTER — Ambulatory Visit
Admission: RE | Admit: 2010-06-28 | Discharge: 2010-06-28 | Payer: Self-pay | Source: Home / Self Care | Attending: Family Medicine | Admitting: Family Medicine

## 2010-06-28 ENCOUNTER — Ambulatory Visit: Admission: RE | Admit: 2010-06-28 | Discharge: 2010-06-28 | Payer: Self-pay | Source: Home / Self Care

## 2010-06-28 DIAGNOSIS — J069 Acute upper respiratory infection, unspecified: Secondary | ICD-10-CM | POA: Insufficient documentation

## 2010-07-03 ENCOUNTER — Encounter: Payer: Self-pay | Admitting: *Deleted

## 2010-07-06 ENCOUNTER — Ambulatory Visit: Admit: 2010-07-06 | Payer: Self-pay

## 2010-07-07 ENCOUNTER — Telehealth: Payer: Self-pay | Admitting: Family Medicine

## 2010-07-12 NOTE — Assessment & Plan Note (Signed)
Summary: med refills wp   Vital Signs:  Patient Profile:   35 Years Old Female Height:     73 inches Weight:      462.6 pounds Temp:     98.2 degrees F oral Pulse rate:   116 / minute BP sitting:   145 / 82  (left arm)  Vitals Entered By: Alphia Kava (April 15, 2008 8:38 AM)             Is Patient Diabetic? No     PCP:  Johney Maine MD  Chief Complaint:  med refill.  History of Present Illness: CC: refill, mole 1)Dandruff shampoo refill:  patient says that she needs a refill on her dandruff shampoo.  Had been prescribed this in past and it really helped.  Had improved but after she ran out it returned. Tried OTC shampoo but was not effective.  Is itchy. Hair does not fall out. Only in front of head.  No pain.  No other areas on skin that are dry.  No drainage  2)Mole:  Has mole on L thigh that she is worred about.  Says has had her whole life but has gotten bigger past year.  Does not hurt.  Has not changed color.  Does not bleed.  Same shape.  Has several other moles that she is not worried about but asks if they can be removed./     Current Allergies: MERIDIA (SIBUTRAMINE HCL MONOHYDRATE)    Risk Factors:  PAP Smear History:     Date of Last PAP Smear:  11/19/2007   PAP Smear History:     Date of Last PAP Smear:  11/19/2007    Results:  normal   PAP Smear History:     Date of Last PAP Smear:  06/18/2005   Flu Vaccine Result Date:  04/15/2008 Flu Vaccine Result:  given Flu Vaccine Next Due:  1 yr Last PAP:  Done. (06/18/2005 12:00:00 AM) PAP Result Date:  11/19/2007 PAP Result:  normal PAP Next Due:  1 yr   Review of Systems      See HPI   Physical Exam  General:     obese, NAD Head:     No alopecia Dry skin flakes at scalp near hairline frontal area.  No drainage, erythema.  No oily spots Skin:     5 mm pedunculated mole L anterior thigh w/ about 3 mm base/stalk.  Round, uniform in color, non tender  2 small skin tags on neck.       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  If no improvement consider that this could be tinea capitius although unlikely at this point.   Orders: [facility]- Est Level  3 (93818)   Problem # 2:  SKIN TAG (ICD-701.9) Assessment: New Can remove her moles.  The largest, on anterior thigh, looks benign. However given growth will send to pathy once I remove it.   This is larger mole and may need suture to close wound.  Could remove via shave bx, punch bx, or simple excision of stalk.  will remove others as well.  Orders: [facility]- Est Level  3 ([address]37)   Complete Medication List: 1)  Hydrochlorothiazide 25 Mg Tabs (Hydrochlorothiazide) .... Take 1 tablet by mouth every morning 2)  Wellbutrin 100 Mg Tabs (Bupropion hcl) .... Take 1 tablet by mouth single dose 3)  Inderal La 80 Mg Cp24 (Propranolol hcl) .Marland Kitchen.. 1 tab by mouth daily  4)  Nizoral 2 % Sham (Ketoconazole) .... Apply twice weekly for 4 weeks with at least 3 days between each shampoo 5)  Ortho Micronor 0.35 Mg Tabs (Norethindrone (contraceptive)) .... One tablet by mouth once daily at the same time each day. 6)  Zantac 150 Mg Caps (Ranitidine hcl) .Marland Kitchen.. 1 tab by mouth daily  Other Orders: Lipid-[facility] ([address]) Basic Met-[facility] ([address]1478)   Patient Instructions: 1)  f/u next available appt for mole removal (give 30-45 min slot) 2)  START BACK ON PROPRANOLOL (IMDUR) DAILY 3)  GO BACK TO USING SHAMPOO.     Prescriptions: INDERAL LA 80 MG  CP24 (PROPRANOLOL HCL) 1 tab by mouth daily  #30 x 11   Entered and Authorized by:   Johney Maine MD   Signed by:   Johney Maine MD on 04/15/2008   Method used:   [facility] to        [facility] [address] [phone]* (retail)       [address]       [place], [place]  [address]       Ph: [phone] or [phone]       Fax: [phone]   RxID:   [id number] NIZORAL 2 %   SHAM (KETOCONAZOLE) Apply twice weekly for 4 weeks with at least 3 days between each shampoo  #999 x 3   Entered and Authorized by:   Johney Maine MD   Signed by:   Johney Maine MD on 04/15/2008   Method used:   [facility]        [facility] [address] [phone]* (retail)       [address]       [place], [place]  [address]       Ph: [phone] or [phone]       Fax: [phone]   RxID:   [id number] HYDROCHLOROTHIAZIDE 25 MG TABS (HYDROCHLOROTHIAZIDE) Take 1 tablet by mouth every morning  #31 x 11   Entered and Authorized by:   Johney Maine MD   Signed by:   Johney Maine MD on 04/15/2008   Method used:   [facility]        [facility] [address] [phone]* (retail)       [address]       [place], [place]  [address]       Ph: [phone] or [phone]       Fax: [phone]   RxID:   [id number]  ]  Appended Document: Immunization Entry       Influenza Vaccine    Vaccine Type: Fluvax Non-MCR    Site: right deltoid    Mfr: GlaxoSmithKline    Dose: 0.5 ml    Route: IM    Given by: ADINA GOULD    Exp. Date: 12/09/2008    Lot #: [id number]    VIS given: 01/03/07 version given April 15, 2008.  Flu Vaccine Consent Questions    Do you have a history of severe allergic reactions to this vaccine? no    Any prior history of allergic reactions to egg and/or gelatin? no    Do you have a sensitivity to the preservative Thimersol? no    Do you have a past history of Guillan-Barre Syndrome? no    Do you currently have an acute febrile illness? no    Have you ever  had a severe reaction to latex? no    Vaccine information given and explained to patient? yes    Are you currently pregnant? no     Complete Medication List: 1)  Hydrochlorothiazide 25 Mg Tabs (Hydrochlorothiazide) .... Take 1 tablet by mouth every morning 2)  Wellbutrin 100 Mg Tabs (Bupropion hcl) .... Take 1 tablet by mouth  single dose 3)  Inderal La 80 Mg Cp24 (Propranolol hcl) .Marland Kitchen.. 1 tab by mouth daily 4)  Nizoral 2 % Sham (Ketoconazole) .... Apply twice weekly for 4 weeks with at least 3 days between each shampoo 5)  Ortho Micronor 0.35 Mg Tabs (Norethindrone (contraceptive)) .... One tablet by mouth once daily at the same time each day. 6)  Zantac 150 Mg Caps (Ranitidine hcl) .Marland Kitchen.. 1 tab by mouth daily

## 2010-07-12 NOTE — Miscellaneous (Signed)
Summary: Citalopram, Senna, Ketoconazole refills  Clinical Lists Changes  Medications: Rx of CELEXA 20 MG TABS (CITALOPRAM HYDROBROMIDE) 1 tablet by mouth daily;  #31 x 11;  Signed;  Entered by: Jamie Brookes MD;  Authorized by: Jamie Brookes MD;  Method used: Faxed to [facility], [address], [place], [place]  [address], Ph: [phone], Fax: [phone] Rx of SENOKOT 8.6 MG TABS (SENNOSIDES) 2 tabs by mouth daily for constipation;  #62 x 11;  Signed;  Entered by: Jamie Brookes MD;  Authorized by: Jamie Brookes MD;  Method used: Faxed to [facility], [address], [place], [place]  [address], Ph: [phone], Fax: [phone] Rx of NIZORAL 2 % SHAM (KETOCONAZOLE) apply to scalp 4 times a week. Please dispense 2 large bottles.;  #240 x 6;  Signed;  Entered by: Jamie Brookes MD;  Authorized by: Jamie Brookes MD;  Method used: Faxed to [facility], [address] [place], [place]  [address], Ph: [phone], Fax: [phone]    Prescriptions: NIZORAL 2 % SHAM (KETOCONAZOLE) apply to scalp 4 times a week. Please dispense 2 large bottles.  #240 x 6   Entered and Authorized by:   Jamie Brookes MD   Signed by:   Jamie Brookes MD on 10/11/2009   Method used:   Faxed to ...       [facility] (mail-order)       [address] [place], [place]  [address]       Ph: [phone]       Fax: [phone]   RxID:   [id number] SENOKOT 8.6 MG TABS (SENNOSIDES) 2 tabs by mouth daily for constipation  #62 x 11   Entered and Authorized by:   Jamie Brookes MD   Signed by:   Jamie Brookes MD on 10/11/2009   Method used:   Faxed to ...       [facility] (mail-order)       [address] [place], [place]  [address]       Ph: [phone]       Fax: [phone]   RxID:   [id number] CELEXA 20 MG TABS (CITALOPRAM HYDROBROMIDE) 1 tablet by mouth daily  #31 x 11   Entered and Authorized by:   Jamie Brookes MD   Signed by:   Jamie Brookes MD on  10/11/2009   Method used:   Faxed to ...       [facility] (mail-order)       [address] [place], [place]  [address]       Ph: [phone]       Fax: [phone]   RxID:   [id number]

## 2010-07-12 NOTE — Progress Notes (Signed)
  Phone Note Call from Patient   Summary of Call: Patient called, stated that Saturday morning she went to [facility] and diagnosed with UTI.  Given Pyridium and Macrobid.  Since then, every time she urinates she states she feels weak, complains of nausea, and back pain.  No vomiting.  Yesterday she developed a cough productive of sputum, spitting up brown mucus.  No fevers or chills, does complain of body aches.  Recommended patient be seen due to symptoms, could be developing URI.  Concern also is UTI not improving on Abx.  Patient agrees with plan and will call for appt.    Initial call taken by: Renold Don MD,  March 14, 2010 7:15 AM

## 2010-07-12 NOTE — Miscellaneous (Signed)
Summary: refilled Terbinafine, Citalopram, Promethazine  Clinical Lists Changes  Medications: Rx of CELEXA 20 MG TABS (CITALOPRAM HYDROBROMIDE) 1 tablet by mouth daily;  #31 x 0;  Signed;  Entered by: Jamie Brookes MD;  Authorized by: Jamie Brookes MD;  Method used: Historical Rx of TERBINAFINE HCL 250 MG TABS (TERBINAFINE HCL) take 1 pill daily for 3 months then see your doctor. May need treatment up to 6-9 months.;  #34 x 0;  Signed;  Entered by: Jamie Brookes MD;  Authorized by: Visual merchandiser MD;  Method used: Historical Rx of PROMETHAZINE HCL 12.5 MG TABS (PROMETHAZINE HCL) take one pill by mouth as needed for nausea;  #20 x 0;  Signed;  Entered by: Jamie Brookes MD;  Authorized by: Visual merchandiser MD;  Method used: Historical    Prescriptions: PROMETHAZINE HCL 12.5 MG TABS (PROMETHAZINE HCL) take one pill by mouth as needed for nausea  #20 x 0   Entered and Authorized by:   Jamie Brookes MD   Signed by:   Jamie Brookes MD on 08/19/2009   Method used:   Historical   RxID:   [id number] TERBINAFINE HCL 250 MG TABS (TERBINAFINE HCL) take 1 pill daily for 3 months then see your doctor. May need treatment up to 6-9 months.  #34 x 0   Entered and Authorized by:   Jamie Brookes MD   Signed by:   Jamie Brookes MD on 08/19/2009   Method used:   Historical   RxID:   [id number] CELEXA 20 MG TABS (CITALOPRAM HYDROBROMIDE) 1 tablet by mouth daily  #31 x 0   Entered and Authorized by:   Jamie Brookes MD   Signed by:   Jamie Brookes MD on 08/19/2009   Method used:   Historical   RxID:   [id number]

## 2010-07-12 NOTE — Assessment & Plan Note (Signed)
Summary: discuss, lapband, & flu shot,df   ****lapband seminar set up by pt. for May 27, 2010 @ 3pm-5pm. The seminar is going to be held at [employer], CMA  May 03, 2010 10:39 AM ***  FLU SHOT GIVEN TODAY.Jimmy Footman, CMA  May 03, 2010 9:54 AM  Vital Signs:  Patient profile:   35 year old female Height:      73 inches Weight:      494 pounds BMI:     65.41 Temp:     98.5 degrees F oral Pulse rate:   97 / minute BP sitting:   122 / 85  (left arm) Cuff size:   large  Vitals Entered By: Jimmy Footman, CMA (May 03, 2010 9:50 AM) CC: Weight loss Is Patient Diabetic? No Pain Assessment Patient in pain? no        Primary Care Provider:  Jamie Brookes MD  CC:  Weight loss.  History of Present Illness: Weight loss: Pt is here to discuss weight loss. She has tried many different things int he past to lose weight. She has done a lot of the work up to get gastric bypass surgery and then it fell through. She has tried the Meridia (which gave her hives). slim fast and alli which helped but were too expensive, she is still eating weight watchers meals but is not losing weight. She has tried a colon cleanse which made her very sick to her stomach. She admits to yo-yo dieting, losing weight and then gaining is back and then some. She says this causes her to be depressed. She is interested in getting the lap band procedure.   Habits & Providers  Alcohol-Tobacco-Diet     Tobacco Status: never  Allergies (verified): 1)  Meridia  Review of Systems       no other complaint from patient.   Physical Exam  General:  VSS, morbidly obese, otherwise normal appearing, no distress Psych:  dysphoric affect.     Impression & Recommendations:  Problem # 1:  OBESITY, NOS (ICD-278.00) Assessment Deteriorated Pt has gained 6 lbs since she was last seen. She continues to gain weight and is depressed about this fact. She would have improvement in all  her problems if she lost weight and she is concerned that she will end up like her mom (diabetes with renal complications about to go on dialysis). She would like to pursue the Lap Band procedure work-up. We contacted [facility] while she was in the office and she was able to set up an appointment for an evaluation.   Orders: [facility]- Est Level  3 ([address])  Complete Medication List: 1)  Hydrochlorothiazide 25 Mg Tabs (Hydrochlorothiazide) .... Take 1 tablet by mouth every morning 2)  Topiramate 50 Mg Tabs (Topiramate) .... Take 1 pill twice a day (morning and evening) 3)  Celexa 20 Mg Tabs (Citalopram hydrobromide) .Marland Kitchen.. 1 tablet by mouth daily 4)  Seroquel 25 Mg Tabs (Quetiapine fumarate) .Marland Kitchen.. 1 tablet by mouth qhs 5)  Clonazepam 2 Mg Tabs (Clonazepam) .... Take 1 tablet two times a day for anxiety 6)  Senokot 8.6 Mg Tabs (Sennosides) .... 2 tabs by mouth daily for constipation 7)  Cetirizine Hcl 10 Mg Tabs (Cetirizine hcl) .Marland Kitchen.. 1 tablet by mouth at bedtime 8)  [facility] Omeprazole 20 Mg Tbec (Omeprazole) .Marland Kitchen.. 1 pill daily 9)  Promethazine Hcl 12.5 Mg Tabs (Promethazine hcl) .... Take one pill by mouth as needed for nausea 10)  Terbinafine Hcl 250 Mg Tabs (  Terbinafine hcl) .... Take 1 pill daily for 3 months then see your doctor. may need treatment up to 6-9 months. 11)  Nizoral 2 % Sham (Ketoconazole) .... Apply to scalp 4 times a week. please dispense 2 large bottles. 12)  Gas Relief 80 Mg Chew (Simethicone) .Marland Kitchen.. 1 tab up to 4 times a day for gas relief 13)  Miralax Powd (Polyethylene glycol 3350) .... Take 1 capful of powder. 14)  Ultracet 37.5-325 Mg Tabs (Tramadol-acetaminophen) .... Take 2 tablets every 6 hours as needed for pain 15)  Lamisil At 1 % Crea (Terbinafine hcl) .... Apply to face areas two times a day  give 1 large tube 16)  Fluconazole 150 Mg Tabs (Fluconazole) .... Take 1 pill now, then repeat in 4 days if no improvement  Other Orders: Flu Vaccine 25yrs + ([address]) Admin 1st Vaccine  ([address]6578)  Patient Instructions: 1)  [facility] is the place to get it done so you will have your appointment and then it is 6 months of appointments with me to document your weight loss attempts. You can not miss any appointments with me once we start this process.  2)  I'm very excited that you have decided to make this change in your life. I am willing to work as hard as you are to make this happen.    Orders Added: 1)  Flu Vaccine 60yrs + [90658] 2)  Admin 1st Vaccine [90471] 3)  [facility]- Est Level  3 [46962]   Immunizations Administered:  Influenza Vaccine # 1:    Vaccine Type: Fluvax 3+    Site: left deltoid    Mfr: GlaxoSmithKline    Dose: 0.5 ml    Route: IM    Given by: Jimmy Footman, CMA    Exp. Date: 12/10/2010    Lot #: [id number]    VIS given: 01/04/10 version given May 03, 2010.   Immunizations Administered:  Influenza Vaccine # 1:    Vaccine Type: Fluvax 3+    Site: left deltoid    Mfr: GlaxoSmithKline    Dose: 0.5 ml    Route: IM    Given by: Jimmy Footman, CMA    Exp. Date: 12/10/2010    Lot #: [id number]    VIS given: 01/04/10 version given May 03, 2010.

## 2010-07-12 NOTE — Progress Notes (Signed)
Summary: triage  Phone Note Call from Patient Call back at [phone]   Caller: Patient Summary of Call: Pt checking on test results and also has question for nurse. Initial call taken by: Clydell Hakim,  August 12, 2009 2:52 PM  Follow-up for Phone Call        left message Follow-up by: Golden Circle RN,  August 12, 2009 3:11 PM  Additional Follow-up for Phone Call Additional follow up Details #1::        pt returned call Additional Follow-up by: De Nurse,  August 12, 2009 3:16 PM    Additional Follow-up for Phone Call Additional follow up Details #2::    no menses last month. has one now. has a novasure . she is not sure what that is but states it prevents pregnancy.  voided small amount & has an "overwhelming sensation" unable to describe what that was.. then had another small void. thinks she may have a bladder infection. has not drunk much fluid today. denies pain burning itching or suprapubic aches. advised drinking lot more water so she voids more. told her urine should be a pale yellow. asked her to call tomorrow am if she feels she needs to be seen or if signs of an infection start. she agreed with plan Follow-up by: Golden Circle RN,  August 12, 2009 3:18 PM  Additional Follow-up for Phone Call Additional follow up Details #3:: Details for Additional Follow-up Action Taken: agree. Will call with results.  Additional Follow-up by: Jamie Brookes MD,  August 13, 2009 11:39 AM

## 2010-07-12 NOTE — Progress Notes (Signed)
Summary: triage  Phone Note Call from Patient Call back at Home Phone [phone]   Caller: Patient Summary of Call: bleeding in bowels - wants to come in Initial call taken by: De Nurse,  July 23, 2009 10:46 AM  Follow-up for Phone Call        blood after wiping. not constipated. has happened before, mostly with constipation. denies hemmorhoids. using ALLI. gets "squirts" of stool.  bottom is sore from stooling she wants to be seen. work in at 1:30 today Follow-up by: Golden Circle RN,  July 23, 2009 10:47 AM  Additional Follow-up for Phone Call Additional follow up Details #1::        Pt can decrease Alli or decrease her oily foods. Thanks for working her in.  Additional Follow-up by: Jamie Brookes MD,  July 23, 2009 4:41 PM

## 2010-07-12 NOTE — Progress Notes (Signed)
  Phone Note Call from Patient   Caller: Patient Summary of Call: Patient complaining of back pain that started earlier today.  Describes as colicky pain that began on her left side and now radiates into her stomach and vaginal area.  Describes pain as "achy" in nature and similar to when she had previous urinary tract infections.  States she was seen in Urgent Care on 10/3 and transferred to [facility] ED for IV potassium and antibiotics before being DC'ed from ED with by mouth antibiotics.  Her mother just left the hospital and is at home sick, she doesn't want to leave her but will call around and find someone to watch her mom.  Recommended she go to Urgent Care as she has tried Tylenol and Ibuprofen 800 mg several times today without relief.  No dysuria, no vaginal bleeding, no blood in urine, no fevers or chills.  Patient states she will find someone to watch over her mom and go to Urgent Care.   Initial call taken by: Renold Don MD,  March 22, 2010 5:12 PM

## 2010-07-12 NOTE — Miscellaneous (Signed)
Summary: refills  Clinical Lists Changes  Medications: Changed medication from HYDROCHLOROTHIAZIDE 25 MG TABS (HYDROCHLOROTHIAZIDE) Take 1 tablet by mouth every morning to HYDROCHLOROTHIAZIDE 25 MG TABS (HYDROCHLOROTHIAZIDE) Take 1 tablet by mouth every morning - Signed Rx of HYDROCHLOROTHIAZIDE 25 MG TABS (HYDROCHLOROTHIAZIDE) Take 1 tablet by mouth every morning;  #31 x 11;  Signed;  Entered by: Jamie Brookes MD;  Authorized by: Jamie Brookes MD;  Method used: Print then Give to Patient Rx of CETIRIZINE HCL 10 MG TABS (CETIRIZINE HCL) 1 tablet by mouth at bedtime;  #32 x 11;  Signed;  Entered by: Jamie Brookes MD;  Authorized by: Jamie Brookes MD;  Method used: Print then Give to Patient Rx of MIRALAX  POWD (POLYETHYLENE GLYCOL 3350) take 1 capful of powder.;  #527 x 11;  Signed;  Entered by: Jamie Brookes MD;  Authorized by: Jamie Brookes MD;  Method used: Print then Give to Patient Rx of [facility] OMEPRAZOLE 20 MG TBEC (OMEPRAZOLE) 1 pill daily;  #30 x 11;  Signed;  Entered by: Jamie Brookes MD;  Authorized by: Jamie Brookes MD;  Method used: Print then Give to Patient    Prescriptions: [facility] OMEPRAZOLE 20 MG TBEC (OMEPRAZOLE) 1 pill daily  #30 x 11   Entered and Authorized by:   Jamie Brookes MD   Signed by:   Jamie Brookes MD on 01/27/2010   Method used:   Print then Give to Patient   RxID:   [id number] MIRALAX  POWD (POLYETHYLENE GLYCOL 3350) take 1 capful of powder.  #527 x 11   Entered and Authorized by:   Jamie Brookes MD   Signed by:   Jamie Brookes MD on 01/27/2010   Method used:   Print then Give to Patient   RxID:   [id number] CETIRIZINE HCL 10 MG TABS (CETIRIZINE HCL) 1 tablet by mouth at bedtime  #32 x 11   Entered and Authorized by:   Jamie Brookes MD   Signed by:   Jamie Brookes MD on 01/27/2010   Method used:   Print then Give to Patient   RxID:   [id number] HYDROCHLOROTHIAZIDE 25 MG TABS (HYDROCHLOROTHIAZIDE) Take 1 tablet by mouth every  morning  #31 x 11   Entered and Authorized by:   Jamie Brookes MD   Signed by:   Jamie Brookes MD on 01/27/2010   Method used:   Print then Give to Patient   RxID:   [id number]

## 2010-07-12 NOTE — Miscellaneous (Signed)
Summary: ketoconazole refill  Clinical Lists Changes  Medications: Rx of NIZORAL 2 % SHAM (KETOCONAZOLE) apply to scalp 4 times a week. Please dispense 2 large bottles.;  #240 x 6;  Signed;  Entered by: Jamie Brookes MD;  Authorized by: Jamie Brookes MD;  Method used: Print then Give to Patient    Prescriptions: NIZORAL 2 % SHAM (KETOCONAZOLE) apply to scalp 4 times a week. Please dispense 2 large bottles.  #240 x 6   Entered and Authorized by:   Jamie Brookes MD   Signed by:   Jamie Brookes MD on 03/06/2010   Method used:   Print then Give to Patient   RxID:   [id number]

## 2010-07-12 NOTE — Letter (Signed)
Summary: Results Follow-up Letter  [facility]  [address]   [place], [place] [address]   Phone: [phone]  Fax: [phone]    11/09/2009  [address] [place], [place]  [address]  Dear Ms. MARSICANO,   The following are the results of your recent test(s):  Test     Result     Hemoglobin         12. 5  Normal  You are not anemic. In order to feel better and to feel more energetic focus on your exercise and diet. Try to walk for about 30 minutes so that your heart is pumping fast everday. Also, focus on eating fresh fruits and vegetables. With all the fresh summer veggies coming out now you should be able to find lots of varieties.   I will see you on 11-17-09 at 4:30 to take off those skin tags.   Sincerely,  Jamie Brookes MD [facility]

## 2010-07-12 NOTE — Progress Notes (Signed)
Summary: Rx Prob  Phone Note Call from Patient   Caller: Patient Summary of Call: Pt says she was to get a rx sent in for a yeast infection to [facility] also says that her Clonzapem was to be increased to 2 2xs a day. Initial call taken by: Clydell Hakim,  January 21, 2010 4:48 PM  Follow-up for Phone Call        will forward to MD. Follow-up by: Theresia Lo RN,  January 21, 2010 5:26 PM  Additional Follow-up for Phone Call Additional follow up Details #1::        they were all sent in the day I saw her and today. Additional Follow-up by: Jamie Brookes MD,  January 21, 2010 5:29 PM    Additional Follow-up for Phone Call Additional follow up Details #2::    spoke with patient and Rx was not received by pharmacy.  Rx called in for clonazapam and fluconazole verbally. Follow-up by: Theresia Lo RN,  January 21, 2010 5:51 PM

## 2010-07-12 NOTE — Progress Notes (Signed)
  Phone Note Call from Patient   Caller: Patient Summary of Call: patient returning call. informed all labs normal Initial call taken by: Lequita Asal  MD,  July 19, 2009 6:48 PM

## 2010-07-12 NOTE — Progress Notes (Signed)
Summary: triage  Phone Note Call from Patient Call back at [phone]   Caller: Patient Summary of Call: achey/burning all over Initial call taken by: De Nurse,  June 14, 2009 10:10 AM  Follow-up for Phone Call        patient reports  headache for 5 days,  legs and arms ache, chest discomfort, feels real jittery, nauseated. advised to come to office now for a work in appointment. Follow-up by: Theresia Lo RN,  June 14, 2009 10:53 AM

## 2010-07-12 NOTE — Miscellaneous (Signed)
Summary: she went to [facility]  Clinical Lists Changes rec'd call from [facility]. pt went there this am for same issues she has Saturday when she went to [facility]. I gave them a one time only NPI use for today's visit.Golden Circle RN  March 14, 2010 9:32 AM  Pt spoke to Roma this morning and was suppose to call us for an appointment. I hope she is now getting better. Jamie Brookes MD  March 14, 2010 3:01 PM

## 2010-07-12 NOTE — Assessment & Plan Note (Signed)
Summary: chest discomfort, tinea, vaginal candidiasis   Vital Signs:  Patient profile:   35 year old female Weight:      488.1 pounds BMI:     64.63 Temp:     98.3 degrees F oral Pulse rate:   81 / minute Pulse rhythm:   regular BP sitting:   130 / 83  Vitals Entered By: Loralee Pacas CMA (January 19, 2010 1:59 PM)  CC: Anxiety, Tinea, vaginal odor   Primary Care Zayde Stroupe:  Jamie Brookes MD  CC:  Anxiety, Tinea, and vaginal odor.  History of Present Illness: Anxiety: Had episode of Rt to left sided chest pressure and pain after stressful converestion with her daugther at the doctors office. She felt nauseated, lasted 5 minutes, couldn't talk well, difficult to breath, felt like she was going to pass out but didn't. Couldn't think right. Has h/o anxiety attacks but says this was worse.   Tinea: Pt has h/o tinea infection on her face. Currently using an apricot scrub to clean her face. Discussed derm appointment but decided to try Lamisil.   Vaginal candidiasis, Pt says that she has a vaginal odor. She is concerned that she is having some recurrence of the BV that she had before. She has not burning, itching or discharge. She has not had any new sexual partners and in fact denies sex since last treatement.   Habits & Providers  Alcohol-Tobacco-Diet     Tobacco Status: never  Current Medications (verified): 1)  Hydrochlorothiazide 25 Mg Tabs (Hydrochlorothiazide) .... Take 1 Tablet By Mouth Every Morning 2)  Topiramate 50 Mg Tabs (Topiramate) .... Take 1 Pill Twice A Day (Morning and Evening) 3)  Celexa 20 Mg Tabs (Citalopram Hydrobromide) .Marland Kitchen.. 1 Tablet By Mouth Daily 4)  Seroquel 25 Mg Tabs (Quetiapine Fumarate) .Marland Kitchen.. 1 Tablet By Mouth Qhs 5)  Clonazepam 2 Mg Tabs (Clonazepam) .... Take 1 Tablet Two Times A Day For Anxiety 6)  Senokot 8.6 Mg Tabs (Sennosides) .... 2 Tabs By Mouth Daily For Constipation 7)  Cetirizine Hcl 10 Mg Tabs (Cetirizine Hcl) .Marland Kitchen.. 1 Tablet By Mouth At  Bedtime 8)  Cvs Omeprazole 20 Mg Tbec (Omeprazole) .Marland Kitchen.. 1 Pill Daily 9)  Promethazine Hcl 12.5 Mg Tabs (Promethazine Hcl) .... Take One Pill By Mouth As Needed For Nausea 10)  Terbinafine Hcl 250 Mg Tabs (Terbinafine Hcl) .... Take 1 Pill Daily For 3 Months Then See Your Doctor. May Need Treatment Up To 6-9 Months. 11)  Nizoral 2 % Sham (Ketoconazole) .... Apply To Scalp 4 Times A Week. Please Dispense 2 Large Bottles. 12)  Gas Relief 80 Mg Chew (Simethicone) .Marland Kitchen.. 1 Tab Up To 4 Times A Day For Gas Relief 13)  Miralax  Powd (Polyethylene Glycol 3350) .... Take 1 Capful of Powder. 14)  Ultracet 37.5-325 Mg Tabs (Tramadol-Acetaminophen) .... Take 2 Tablets Every 6 Hours As Needed For Pain 15)  Lamisil At 1 % Crea (Terbinafine Hcl) .... Apply To Face Areas Two Times A Day  Give 1 Large Tube  Allergies: 1)  Meridia  Review of Systems        vitals reviewed and pertinent negatives and positives seen in HPI   Physical Exam  General:  morbidly obese, alert, well-hydrated, and normal appearance.   Lungs:  Normal respiratory effort, chest expands symmetrically. Lungs are clear to auscultation, no crackles or wheezes. Heart:  Normal rate and regular rhythm. S1 and S2 normal without gallop, murmur, click, rub or other extra sounds. Genitalia:  Normal introitus  for age, no external lesions, no vaginal discharge, mucosa pink and moist, no vaginal or cervical lesions, no vaginal atrophy, no friaility or hemorrhage, normal uterus size and position, no adnexal masses or tenderness Skin:  small areas of scarring on her face that are hyperpigmented.    Impression & Recommendations:  Problem # 1:  CHEST DISCOMFORT (ICD-786.59) Assessment Deteriorated Pt appears to be having anxeity attacks. Her EKG normal. She has been taking Klonopin but says that she still feels anxious. Plan to increase the Klonopin to see if her symptoms improve.   Orders: 12 Lead EKG (12 Lead EKG) [facility]- Est  Level 4  (99214)  Problem # 2:  TINEA CAPITIS (ICD-110.0) Assessment: New Pt describes tinea on her face. She then picks and straches at it until the area scars. She has been using an exfollient every day. Suggested using the scrub 1 time a week, using the lamsil around her nose and in the folds of her face and around her eyebrows where she seems to have the flaking.   Orders: [facility]- Est  Level 4 (66440)  Problem # 3:  CANDIDIASIS, VULVOVAGINAL (ICD-112.1) Assessment: New Pt willt ake Fluconazole for the yeast and WBC's found on her wet prep. She is most concerned about the odor. Let her know that if the infection is cleared the odor should go away. Pt also may have order because of the excess sweat in that region created by the excess weight.    Her updated medication list for this problem includes:    Terbinafine Hcl 250 Mg Tabs (Terbinafine hcl) .Marland Kitchen... Take 1 pill daily for 3 months then see your doctor. may need treatment up to 6-9 months.    Nizoral 2 % Sham (Ketoconazole) .Marland Kitchen... Apply to scalp 4 times a week. please dispense 2 large bottles.    Lamisil At 1 % Crea (Terbinafine hcl) .Marland Kitchen... Apply to face areas two times a day  give 1 large tube    Fluconazole 150 Mg Tabs (Fluconazole) .Marland Kitchen... Take 1 pill now, then repeat in 4 days if no improvement  Orders: [facility]- Est  Level 4 (34742)  Complete Medication List: 1)  Hydrochlorothiazide 25 Mg Tabs (Hydrochlorothiazide) .... Take 1 tablet by mouth every morning 2)  Topiramate 50 Mg Tabs (Topiramate) .... Take 1 pill twice a day (morning and evening) 3)  Celexa 20 Mg Tabs (Citalopram hydrobromide) .Marland Kitchen.. 1 tablet by mouth daily 4)  Seroquel 25 Mg Tabs (Quetiapine fumarate) .Marland Kitchen.. 1 tablet by mouth qhs 5)  Clonazepam 2 Mg Tabs (Clonazepam) .... Take 1 tablet two times a day for anxiety 6)  Senokot 8.6 Mg Tabs (Sennosides) .... 2 tabs by mouth daily for constipation 7)  Cetirizine Hcl 10 Mg Tabs (Cetirizine hcl) .Marland Kitchen.. 1 tablet by mouth at bedtime 8)  Cvs  Omeprazole 20 Mg Tbec (Omeprazole) .Marland Kitchen.. 1 pill daily 9)  Promethazine Hcl 12.5 Mg Tabs (Promethazine hcl) .... Take one pill by mouth as needed for nausea 10)  Terbinafine Hcl 250 Mg Tabs (Terbinafine hcl) .... Take 1 pill daily for 3 months then see your doctor. may need treatment up to 6-9 months. 11)  Nizoral 2 % Sham (Ketoconazole) .... Apply to scalp 4 times a week. please dispense 2 large bottles. 12)  Gas Relief 80 Mg Chew (Simethicone) .Marland Kitchen.. 1 tab up to 4 times a day for gas relief 13)  Miralax Powd (Polyethylene glycol 3350) .... Take 1 capful of powder. 14)  Ultracet 37.5-325 Mg Tabs (Tramadol-acetaminophen) .... Take 2 tablets  every 6 hours as needed for pain 15)  Lamisil At 1 % Crea (Terbinafine hcl) .... Apply to face areas two times a day  give 1 large tube 16)  Fluconazole 150 Mg Tabs (Fluconazole) .... Take 1 pill now, then repeat in 4 days if no improvement  [id number]  Patient Instructions: 1)  We did a wet prep today to test for any reason for odor.  2)  I will call you with results.  3)  Get the Lamisil for the skin flaking. Use it twice a day for the next 2 weeks to see if it will clear up your skin. If it does, take a break from the medicine and just use as needed.  Prescriptions: FLUCONAZOLE 150 MG TABS (FLUCONAZOLE) take 1 pill now, then repeat in 4 days if no improvement  #2 x 0   Entered and Authorized by:   Jamie Brookes MD   Signed by:   Jamie Brookes MD on 01/21/2010   Method used:   Faxed to ...       [facility] [employer] (mail-order)       [address] [place], [place]  [address]       Ph: [phone]       Fax: [phone]   RxID:   [id number] CLONAZEPAM 2 MG TABS (CLONAZEPAM) take 1 tablet two times a day for anxiety  #62 x 11   Entered and Authorized by:   Jamie Brookes MD   Signed by:   Jamie Brookes MD on 01/19/2010   Method used:   Printed then faxed to ...       [facility] [employer] (mail-order)        [address] [place], [place]  [address]       Ph: [phone]       Fax: [phone]   RxID:   [id number] LAMISIL AT 1 % CREA (TERBINAFINE HCL) apply to face areas two times a day  Give 1 large tube  #1 x 11   Entered and Authorized by:   Jamie Brookes MD   Signed by:   Jamie Brookes MD on 01/19/2010   Method used:   Faxed to ...       [facility] [employer] (mail-order)       [address] [place], [place]  [address]       Ph: [phone]       Fax: [phone]   RxID:   [id number]   Laboratory Results  Date/Time Received: January 19, 2010 3:24 PM  Date/Time Reported: January 19, 2010 3:48 PM   Wet Gandy Source: vag WBC/hpf: >20 Bacteria/hpf: 2+  Rods Clue cells/hpf: none  Negative whiff Yeast/hpf: few Trichomonas/hpf: none Comments: ...............test performed by......Marland KitchenBonnie A. [place], MLS (ASCP)cm     Appended Document: chest discomfort, tinea, vaginal candidiasis rx was not received for clonazapam and fluconazole by pharmacy . called in Rx verbally . gave no refills on clonazapm as pharmacist advises cannot refill with 11 refills. MD notified.

## 2010-07-12 NOTE — Assessment & Plan Note (Signed)
Summary: cpe w/ pap   Vital Signs:  Patient profile:   35 year old female Weight:      479.5 pounds Temp:     98.5 degrees F oral Pulse rate:   79 / minute Pulse rhythm:   regular BP sitting:   131 / 91  (left arm) Cuff size:   large  Vitals Entered By: Loralee Pacas CMA (August 09, 2009 10:24 AM)  Primary Care Provider:  Jamie Brookes MD  CC:  CPe: Obesity, Rt sided ad/bowel spasms, and Pap smear.  History of Present Illness: Went over each med;  headaches:  under control with using Topmax  Morbid Obesity:  Pt has been eating more fruits and veggeis, she has been walking 30 min/day, she has been doing the slim fast shakes and has lost 12 lbs in the last 24 days. She is excited about her weight loss. Gave some more tips about it today. (See instructions)  Bowel/Rt sided spasms;  Pt think she may be having muscle spasms on the Rt abdomen. They last 1-2 minutes and are very painful "feels like contractions". They do not correlate with her food. She is not constipated (has 2 soft stools a day).   Pap smear: Pt has always had normal pap smears. She had the Novasure uterine ablation done last year for bleeding and then developed a UTI but has not had any problems with her cervix.   Habits & Providers  Alcohol-Tobacco-Diet     Alcohol drinks/day: occasional with friends.      Tobacco Status: never  Exercise-Depression-Behavior     Does Patient Exercise: yes     Exercise Counseling: to improve exercise regimen     Type of exercise: walking     Exercise (avg: min/session): <30  Current Medications (verified): 1)  Hydrochlorothiazide 25 Mg Tabs (Hydrochlorothiazide) .... Take 1 Tablet By Mouth Every Morning 2)  Topiramate 50 Mg Tabs (Topiramate) .... Take 1 Pill Twice A Day (Morning and Evening) 3)  Celexa 20 Mg Tabs (Citalopram Hydrobromide) .Marland Kitchen.. 1 Tablet By Mouth Daily 4)  Seroquel 25 Mg Tabs (Quetiapine Fumarate) .Marland Kitchen.. 1 Tablet By Mouth Qhs 5)  Klonopin 1 Mg Tabs  (Clonazepam) .Marland Kitchen.. 1 Tablet By Mouth Bid 6)  Senokot 8.6 Mg Tabs (Sennosides) .... 2 Tabs By Mouth Daily For Constipation 7)  Cetirizine Hcl 10 Mg Tabs (Cetirizine Hcl) .Marland Kitchen.. 1 Tablet By Mouth At Bedtime 8)  Cvs Omeprazole 20 Mg Tbec (Omeprazole) .Marland Kitchen.. 1 Pill Daily 9)  Promethazine Hcl 12.5 Mg Tabs (Promethazine Hcl) .... Take One Pill By Mouth As Needed For Nausea 10)  Ibuprofen 800 Mg Tabs (Ibuprofen) .... Take One Pill Up To 3 Times A Day For Ankle Pain. Do No Exceed 3 Pills in One Day. 11)  Terbinafine Hcl 250 Mg Tabs (Terbinafine Hcl) .... Take 1 Pill Daily For 3 Months Then See Your Doctor. May Need Treatment Up To 6-9 Months. 12)  Nizoral 2 % Sham (Ketoconazole) .... Apply To Scalp 4 Times A Week. Please Dispense 2 Large Bottles. 13)  Gas Relief 80 Mg Chew (Simethicone) .Marland Kitchen.. 1 Tab Up To 4 Times A Day For Gas Relief 14)  Miralax  Powd (Polyethylene Glycol 3350) .... Take 1 Capful of Powder.  Allergies (verified): 1)  Meridia  Past History:  Past Surgical History: HIDA Scan with CCK - WNL - 12/11/2002, RUQ U/S - mild fatty liver, no GS - 12/10/2002 Bilteral Tubal Ligation in 2006 Novasure 2010 for uterine bleeding  Social History: Smoking Status:  never  Review of Systems        vitals reviewed and pertinent negatives and positives seen in HPI   Physical Exam  General:  Well-developed,well-nourished,in no acute distress; alert,appropriate and cooperative throughout examination Lungs:  difficult to hear due to large body habitus but no crackles and no wheezes heard.  Lungs Clear to auscultation bilaterally as far as I can hear.  Heart:  mid-systolic murur heard best at [facility] but her heart is very difficult to hear due to large body habitus.  Abdomen:  morbidly obese, no tenderness to palpation, no masses, + BS Extremities:  no edema Skin:  Intact without suspicious lesions or rashes Psych:  Cognition and judgment appear intact. Alert and cooperative with normal attention span and  concentration. No apparent delusions, illusions, hallucinations   Impression & Recommendations:  Problem # 1:  HEALTH MAINTENANCE EXAM (ICD-V70.0) Assessment Unchanged Pt is doing well. Discussed many health issues with her. She is doing well on her meds and understands how she uses them. she is ahving some right sided abd pain that sounds like bowel spasm. Advised to keep bowels soft and moving regularily as she is doing it.   Est  18-39 [phone])  Problem # 2:  SCREENING FOR MALIGNANT NEOPLASM OF THE CERVIX (ICD-V76.2) Assessment: Unchanged Pt had a pap smear today. Will call her with results.   Est 18-39 [phone])  Problem # 3:  OBESITY, NOS (ICD-278.00) Assessment: Comment Only Pt has lost 12 lbs in the last 24 days. See HPi.   Orders: [facility] - Est  18-39 yrs (09811)  Problem # 4:  HEADACHE, UNSPECIFIED (ICD-784.0) Assessment: Comment Only Pt is improved on the Topamax.   Her updated medication list for this problem includes:    Ibuprofen 800 Mg Tabs (Ibuprofen) .Marland Kitchen... Take one pill up to 3 times a day for ankle pain. do no exceed 3 pills in one day.  Orders: [facility] - Est  18-39 yrs ([id number])  Complete Medication List: 1)  Hydrochlorothiazide 25 Mg Tabs (Hydrochlorothiazide) .... Take 1 tablet by mouth every morning 2)  Topiramate 50 Mg Tabs (Topiramate) .... Take 1 pill twice a day (morning and evening) 3)  Celexa 20 Mg Tabs (Citalopram hydrobromide) .Marland Kitchen.. 1 tablet by mouth daily 4)  Seroquel 25 Mg Tabs (Quetiapine fumarate) .Marland Kitchen.. 1 tablet by mouth qhs 5)  Klonopin 1 Mg Tabs (Clonazepam) .Marland Kitchen.. 1 tablet by mouth bid 6)  Senokot 8.6 Mg Tabs (Sennosides) .... 2 tabs by mouth daily for constipation 7)  Cetirizine Hcl 10 Mg Tabs (Cetirizine hcl) .Marland Kitchen.. 1 tablet by mouth at bedtime 8)  Cvs Omeprazole 20 Mg Tbec (Omeprazole) .Marland Kitchen.. 1 pill daily 9)  Promethazine Hcl 12.5 Mg Tabs (Promethazine hcl) .... Take one pill by mouth as needed for nausea 10)  Ibuprofen 800 Mg Tabs (Ibuprofen) .... Take  one pill up to 3 times a day for ankle pain. do no exceed 3 pills in one day. 11)  Terbinafine Hcl 250 Mg Tabs (Terbinafine hcl) .... Take 1 pill daily for 3 months then see your doctor. may need treatment up to 6-9 months. 12)  Nizoral 2 % Sham (Ketoconazole) .... Apply to scalp 4 times a week. please dispense 2 large bottles. 13)  Gas Relief 80 Mg Chew (Simethicone) .Marland Kitchen.. 1 tab up to 4 times a day for gas relief 14)  Miralax Powd (Polyethylene glycol 3350) .... Take 1 capful of powder.  Other Orders: GC/Chlamydia-[facility] ([id number])  Patient Instructions: 1)  Great Job on the weight  loss. You have lost 12 lbs in the last 24 days. Keep up the good work. Your weight today was 479.5, last times was 491.  2)  Weight loss tips: writing down what you eat everyday and what you are doing while you're eating. There are internet sites that will track this for you for free. [employer]: calorie counting 3)  Exercise recommendations for weight loss: 60-90 min/day 4)  if using pedometer get 10,000 steps a day.  5)  Our office will call you with lab results.

## 2010-07-12 NOTE — Miscellaneous (Signed)
Summary: Clonazepam refill  Clinical Lists Changes  Medications: Changed medication from KLONOPIN 1 MG TABS (CLONAZEPAM) 1 tablet by mouth BID to KLONOPIN 1 MG TABS (CLONAZEPAM) 1 tablet by mouth BID - Signed Rx of KLONOPIN 1 MG TABS (CLONAZEPAM) 1 tablet by mouth BID;  #62 x 0;  Signed;  Entered by: Jamie Brookes MD;  Authorized by: Jamie Brookes MD;  Method used: Handwritten    Prescriptions: KLONOPIN 1 MG TABS (CLONAZEPAM) 1 tablet by mouth BID  #62 x 0   Entered and Authorized by:   Jamie Brookes MD   Signed by:   Jamie Brookes MD on 12/03/2009   Method used:   Handwritten   RxID:   [id number]

## 2010-07-12 NOTE — Assessment & Plan Note (Signed)
Summary: rib pain, skin tags, fatigue   Vital Signs:  Patient profile:   35 year old female Weight:      487.6 pounds BMI:     64.56 Temp:     98.3 degrees F oral Pulse rate:   80 / minute Pulse rhythm:   regular BP sitting:   134 / 85  (left arm) Cuff size:   large  Vitals Entered By: Loralee Pacas CMA (Nov 04, 2009 1:34 PM)  CC: pain under breast   Primary Care Provider:  Jamie Brookes MD  CC:  pain under breast.  History of Present Illness: Pain under Left breast: Pt has been noticing a tenderness under her left breast that is directly over her left ribs. She noticed it while laying down. At first she thought she had a lump and that it was tender. But when felt it was her ribs. She has been having this rib pain for about 1 week.  It does not shoot anywhere. It is only tender directly over the ribs. She does not remember having a fall.  Skin Tags: Pt has multiple skin tags under her breast. Some are larger than others and the larger ones bother her. She would like to get them removed today. Discussed with patient that we do not have time to remove them today but I would be happy to remove them at a later visit.   Fatigue: Pt not feeling well. She is tired all the time. She has some young children and is having a hard time keeping up with them. She has a h/o anemia at one point. She would like to make sure she is not anemic again. She is not having any abnormal bleeding anymore.   Current Medications (verified): 1)  Hydrochlorothiazide 25 Mg Tabs (Hydrochlorothiazide) .... Take 1 Tablet By Mouth Every Morning 2)  Topiramate 50 Mg Tabs (Topiramate) .... Take 1 Pill Twice A Day (Morning and Evening) 3)  Celexa 20 Mg Tabs (Citalopram Hydrobromide) .Marland Kitchen.. 1 Tablet By Mouth Daily 4)  Seroquel 25 Mg Tabs (Quetiapine Fumarate) .Marland Kitchen.. 1 Tablet By Mouth Qhs 5)  Klonopin 1 Mg Tabs (Clonazepam) .Marland Kitchen.. 1 Tablet By Mouth Bid 6)  Senokot 8.6 Mg Tabs (Sennosides) .... 2 Tabs By Mouth Daily For  Constipation 7)  Cetirizine Hcl 10 Mg Tabs (Cetirizine Hcl) .Marland Kitchen.. 1 Tablet By Mouth At Bedtime 8)  Cvs Omeprazole 20 Mg Tbec (Omeprazole) .Marland Kitchen.. 1 Pill Daily 9)  Promethazine Hcl 12.5 Mg Tabs (Promethazine Hcl) .... Take One Pill By Mouth As Needed For Nausea 10)  Terbinafine Hcl 250 Mg Tabs (Terbinafine Hcl) .... Take 1 Pill Daily For 3 Months Then See Your Doctor. May Need Treatment Up To 6-9 Months. 11)  Nizoral 2 % Sham (Ketoconazole) .... Apply To Scalp 4 Times A Week. Please Dispense 2 Large Bottles. 12)  Gas Relief 80 Mg Chew (Simethicone) .Marland Kitchen.. 1 Tab Up To 4 Times A Day For Gas Relief 13)  Miralax  Powd (Polyethylene Glycol 3350) .... Take 1 Capful of Powder. 14)  Ultracet 37.5-325 Mg Tabs (Tramadol-Acetaminophen) .... Take 2 Tablets Every 6 Hours As Needed For Pain  Allergies (verified): 1)  Meridia  Review of Systems        vitals reviewed and pertinent negatives and positives seen in HPI   Physical Exam  General:  Well-developed,well-nourished,in no acute distress; alert,appropriate and cooperative throughout examination Chest Wall:  tenderness over the left ribs/cartilage near sternum. Pt reproducible upon palaption on left but not on right.  Breasts:  very large pendulous breast, multiple skin tags of different sizes under breast.  Skin:  mutliple skin tags of different shapes and sizes. Some are much larger than others ranging from .5 cm to 1 mm in diameter at the broadest part.  Psych:  pt appears depressed   Impression & Recommendations:  Problem # 1:  RIB PAIN, LEFT SIDED (ICD-786.50) Assessment New Pt has new left sided rib pain under her left breast. She has tenderness directly over ribs on left but not on the right. It is reproducible. This is likely a muscle strain over her rib or some early signs of arthritis (less likely). Will treat with NSAID's. discussed getting an x-ray but in the absence of injury would be low yeild.   Orders: [facility]- Est  Level 4  (04540)  Problem # 2:  SKIN TAG (ICD-701.9) Assessment: New Pt comes in c/o skin tags that are all different sizes under her breast. She has a couple of larger ones that are about 1/2 cm in diameter. We will plan to remove these at a later date.   Orders: [facility]- Est  Level 4 (98119)  Problem # 3:  FATIGUE (ICD-780.79) Assessment: Deteriorated Pt has been feeling poorly. I suspect that this is due to a combination of poor eating, poor sleeping and no exercise coupled with daily stress of raising children. She has a h/o anemia so we will check it today to see if she is anemic again.   Orders: CBC w/Diff-[facility] (14782) [facility]- Est  Level 4 (95621)  Complete Medication List: 1)  Hydrochlorothiazide 25 Mg Tabs (Hydrochlorothiazide) .... Take 1 tablet by mouth every morning 2)  Topiramate 50 Mg Tabs (Topiramate) .... Take 1 pill twice a day (morning and evening) 3)  Celexa 20 Mg Tabs (Citalopram hydrobromide) .Marland Kitchen.. 1 tablet by mouth daily 4)  Seroquel 25 Mg Tabs (Quetiapine fumarate) .Marland Kitchen.. 1 tablet by mouth qhs 5)  Klonopin 1 Mg Tabs (Clonazepam) .Marland Kitchen.. 1 tablet by mouth bid 6)  Senokot 8.6 Mg Tabs (Sennosides) .... 2 tabs by mouth daily for constipation 7)  Cetirizine Hcl 10 Mg Tabs (Cetirizine hcl) .Marland Kitchen.. 1 tablet by mouth at bedtime 8)  Cvs Omeprazole 20 Mg Tbec (Omeprazole) .Marland Kitchen.. 1 pill daily 9)  Promethazine Hcl 12.5 Mg Tabs (Promethazine hcl) .... Take one pill by mouth as needed for nausea 10)  Terbinafine Hcl 250 Mg Tabs (Terbinafine hcl) .... Take 1 pill daily for 3 months then see your doctor. may need treatment up to 6-9 months. 11)  Nizoral 2 % Sham (Ketoconazole) .... Apply to scalp 4 times a week. please dispense 2 large bottles. 12)  Gas Relief 80 Mg Chew (Simethicone) .Marland Kitchen.. 1 tab up to 4 times a day for gas relief 13)  Miralax Powd (Polyethylene glycol 3350) .... Take 1 capful of powder. 14)  Ultracet 37.5-325 Mg Tabs (Tramadol-acetaminophen) .... Take 2 tablets every 6 hours as needed for  pain  Patient Instructions: 1)  Tell the front desk staff it is ok to double book June 8th 4:30 slot for skin tag removal.  2)  I will let you know if your labs are improved or abnormal.  Prescriptions: ULTRACET 37.5-325 MG TABS (TRAMADOL-ACETAMINOPHEN) take 2 tablets every 6 hours as needed for pain  #120 x 1   Entered and Authorized by:   Jamie Brookes MD   Signed by:   Jamie Brookes MD on 11/04/2009   Method used:   Faxed to .Marland KitchenMarland Kitchen  [facility] [employer] (mail-order)       [address] [place], [place]  [address]       Ph: [phone]       Fax: [phone]   RxID:   [id number]

## 2010-07-12 NOTE — Assessment & Plan Note (Signed)
Summary: atypical cp   Vital Signs:  Patient profile:   35 year old female Height:      73 inches Weight:      488.0 pounds BMI:     64.62 O2 Sat:      96 % on Room air Temp:     97.7 degrees F oral Pulse rate:   109 / minute BP sitting:   133 / 87  (left arm) Cuff size:   large  Vitals Entered By: Garen Grams LPN (June 14, 2009 11:53 AM)  O2 Flow:  Room air CC: chest discomfort Is Patient Diabetic? No Pain Assessment Patient in pain? yes     Location: arma/chest Intensity: 5   Primary Care Provider:  Jamie Brookes MD  CC:  chest discomfort.  History of Present Illness: 35yo F c/o chest discomfort  Chest discomfort: Localized to upper chest.  Achy pain.  5/10.  Nonradiating.  Lasts for several minutes not associated with exertion.  Has some nausea and vomiting.  Started 5 days ago and will spontaneously resolve and return.  Denies any acute SOB, numbness, paresthesia, or diaphoresis.  Pt has a history of anxiey and GERD.  States it does not feel like GERD symptoms.  Risk factors include HTN.  She is morbidly obese.    Habits & Providers  Alcohol-Tobacco-Diet     Tobacco Status: never  Current Medications (verified): 1)  Hydrochlorothiazide 25 Mg Tabs (Hydrochlorothiazide) .... Take 1 Tablet By Mouth Every Morning 2)  Inderal La 80 Mg  Cp24 (Propranolol Hcl) .Marland Kitchen.. 1 Tab By Mouth Daily 3)  Celexa 20 Mg Tabs (Citalopram Hydrobromide) .Marland Kitchen.. 1 Tablet By Mouth Daily 4)  Seroquel 25 Mg Tabs (Quetiapine Fumarate) .Marland Kitchen.. 1 Tablet By Mouth Qhs 5)  Klonopin 1 Mg Tabs (Clonazepam) .Marland Kitchen.. 1 Tablet By Mouth Bid 6)  Senokot 8.6 Mg Tabs (Sennosides) .... 2 Tabs By Mouth Daily For Constipation 7)  Cetirizine Hcl 10 Mg Tabs (Cetirizine Hcl) .Marland Kitchen.. 1 Tablet By Mouth At Bedtime 8)  Cvs Omeprazole 20 Mg Tbec (Omeprazole) .Marland Kitchen.. 1 Pill Daily 9)  Promethazine Hcl 12.5 Mg Tabs (Promethazine Hcl) .... Take One Pill By Mouth As Needed For Nausea 10)  Ibuprofen 800 Mg Tabs (Ibuprofen) .... Take  One Pill Up To 3 Times A Day For Ankle Pain. Do No Exceed 3 Pills in One Day. 11)  Terbinafine Hcl 250 Mg Tabs (Terbinafine Hcl) .... Take 1 Pill Daily For 3 Months Then See Your Doctor. May Need Treatment Up To 6-9 Months. 12)  Nizoral 2 % Sham (Ketoconazole) .... Apply To Scalp 4 Times A Week. Please Dispense 2 Large Bottles.  Allergies (verified): 1)  Meridia (Sibutramine Hcl Monohydrate)  Review of Systems      See HPI  Physical Exam  General:  VS reviewed.  Morbidly obese, NAD Eyes:  no injected conjunctiva Neck:  no distended veins Chest Wall:  nontender to palpation Lungs:  Normal respiratory effort, chest expands symmetrically. Lungs are clear to auscultation, no crackles or wheezes. Heart:  Normal rate and regular rhythm. S1 and S2 normal without gallop, murmur, click, rub or other extra sounds. Skin:  normal color Additional Exam:  12 lead EKG- NSR rate 104; essentially unchanged from 08/2008.  No ST or T wave changes.   Impression & Recommendations:  Problem # 1:  CHEST DISCOMFORT (ZOX-096.04) Assessment New Atypical.  Likely non cardiac.  Nl 12 lead EKG.  Pulse ox was wnls.  I suspect some underlying anxiety b/c as we  discussed her symptoms further, she kept adding on more symptoms.  For reassurance, plan to check BMET and CBC and have her f/u in 1-2 weeks with Dr. Clotilde Dieter to reassess.     Orders: 12 Lead EKG (12 Lead EKG) Pulse Oximetry- [facility] (94760) Basic Met-[facility] ([address]-60[address]) CBC-[facility] (09811) [facility]- Est  Level 4 (91478)  Complete Medication List: 1)  Hydrochlorothiazide 25 Mg Tabs (Hydrochlorothiazide) .... Take 1 tablet by mouth every morning 2)  Inderal La 80 Mg Cp24 (Propranolol hcl) .Marland Kitchen.. 1 tab by mouth daily 3)  Celexa 20 Mg Tabs (Citalopram hydrobromide) .Marland Kitchen.. 1 tablet by mouth daily 4)  Seroquel 25 Mg Tabs (Quetiapine fumarate) .Marland Kitchen.. 1 tablet by mouth qhs 5)  Klonopin 1 Mg Tabs (Clonazepam) .Marland Kitchen.. 1 tablet by mouth bid 6)  Senokot 8.6 Mg Tabs (Sennosides) .... 2  tabs by mouth daily for constipation 7)  Cetirizine Hcl 10 Mg Tabs (Cetirizine hcl) .Marland Kitchen.. 1 tablet by mouth at bedtime 8)  Cvs Omeprazole 20 Mg Tbec (Omeprazole) .Marland Kitchen.. 1 pill daily 9)  Promethazine Hcl 12.5 Mg Tabs (Promethazine hcl) .... Take one pill by mouth as needed for nausea 10)  Ibuprofen 800 Mg Tabs (Ibuprofen) .... Take one pill up to 3 times a day for ankle pain. do no exceed 3 pills in one day. 11)  Terbinafine Hcl 250 Mg Tabs (Terbinafine hcl) .... Take 1 pill daily for 3 months then see your doctor. may need treatment up to 6-9 months. 12)  Nizoral 2 % Sham (Ketoconazole) .... Apply to scalp 4 times a week. please dispense 2 large bottles.  Other Orders: Urinalysis-[facility] (00000)  Patient Instructions: 1)  Schedule a follow up with Dr. Clotilde Dieter in 1-2 weeks to go over lab results and symptoms.  If your chest discomfort worsens, call us or go to the emergency room. 2)  Start tylenol 500mg  (2 tabs) every 8 hours.    Laboratory Results   Urine Tests  Date/Time Received: June 14, 2009 12:08 PM  Date/Time Reported: June 14, 2009 12:46 PM   Routine Urinalysis   Color: yellow Appearance: Cloudy Glucose: negative   (Normal Range: Negative) Bilirubin: small;  reflex ictotest = negative   (Normal Range: Negative) Ketone: negative   (Normal Range: Negative) Spec. Gravity: >=1.030   (Normal Range: 1.003-1.035) Blood: negative   (Normal Range: Negative) pH: 5.5   (Normal Range: 5.0-8.0) Protein: trace   (Normal Range: Negative) Urobilinogen: 0.2   (Normal Range: 0-1) Nitrite: negative   (Normal Range: Negative) Leukocyte Esterace: negative   (Normal Range: Negative)    Comments: ...............test performed by......Marland KitchenBonnie A. [place], MLS (ASCP)cm

## 2010-07-12 NOTE — Progress Notes (Signed)
Summary: Rx Req: Klonopin refilled  Phone Note Refill Request Call back at [phone] Message from:  Patient  Refills Requested: Medication #1:  KLONOPIN 1 MG TABS 1 tablet by mouth BID PT USES [facility].  HAS AN APPT FEB 4TH.  Initial call taken by: Clydell Hakim,  June 24, 2009 3:15 PM  Follow-up for Phone Call        will forward to MD. Follow-up by: Theresia Lo RN,  June 24, 2009 4:41 PM  Additional Follow-up for Phone Call Additional follow up Details #1::        printed Rx and signed, will fax to [facility]. I will see her on the 4th.  Additional Follow-up by: Jamie Brookes MD,  June 28, 2009 1:29 PM    Prescriptions: KLONOPIN 1 MG TABS (CLONAZEPAM) 1 tablet by mouth BID  #62 x 6   Entered and Authorized by:   Jamie Brookes MD   Signed by:   Jamie Brookes MD on 06/28/2009   Method used:   Printed then faxed to ...       [facility] [employer] (mail-order)       [address] [place], [place]  [address]       Ph: [phone]       Fax: [phone]   RxID:   [id number]

## 2010-07-12 NOTE — Miscellaneous (Signed)
Summary: Seroquel and Ketoconazole refill  Clinical Lists Changes  Medications: Rx of SEROQUEL 25 MG TABS (QUETIAPINE FUMARATE) 1 tablet by mouth qhs;  #31 x 5;  Signed;  Entered by: Jamie Brookes MD;  Authorized by: Jamie Brookes MD;  Method used: Handwritten Rx of NIZORAL 2 % SHAM (KETOCONAZOLE) apply to scalp 4 times a week. Please dispense 2 large bottles.;  #240 x 6;  Signed;  Entered by: Jamie Brookes MD;  Authorized by: Jamie Brookes MD;  Method used: Handwritten    Prescriptions: NIZORAL 2 % SHAM (KETOCONAZOLE) apply to scalp 4 times a week. Please dispense 2 large bottles.  #240 x 6   Entered and Authorized by:   Jamie Brookes MD   Signed by:   Jamie Brookes MD on 04/12/2010   Method used:   Handwritten   RxID:   [id number] SEROQUEL 25 MG TABS (QUETIAPINE FUMARATE) 1 tablet by mouth qhs  #31 x 5   Entered and Authorized by:   Jamie Brookes MD   Signed by:   Jamie Brookes MD on 04/12/2010   Method used:   Handwritten   RxID:   [id number]  Filled out [facility] paper and will fax back. Jamie Brookes MD  April 12, 2010 7:54 AM

## 2010-07-12 NOTE — Assessment & Plan Note (Signed)
Summary: constipation, gas, reflux, STI testing, TSH and CMP test.    Vital Signs:  Patient profile:   35 year old female Height:      73 inches Weight:      491 pounds BMI:     65.01 Pulse rate:   88 / minute BP sitting:   136 / 86  (right arm)  Vitals Entered By: Arlyss Repress CMA, (July 16, 2009 11:46 AM) CC: f/up labs. refill meds. Pain Assessment Patient in pain? no        Primary Care Provider:  Jamie Brookes MD  CC:  f/up labs. refill meds..  History of Present Illness: Weight loss: Pt is having a hard time losing weight. Discussed what her meals should consist of and what proportions she should eat. Pt seems to know the healtiest way to live but does not seem extremely motivated to do them. Will continue to monitor her weight. Sister had thryoid problems. Wants to be checked for this.   Flatulance: Pt says that she is having more frequent and painful flatulance. She is not taking anything for it. She has not changed her diet. She notes that this all started after her endometrial ablation in July 2010.   Constipation: Pt has chronic constipation for which she takes Senokot and has recently started using Miralax and prune juice.   Labs: Pt concerned about STI's. Wants to be tested today.  Pt wanted to go over her labs from her last visit. Pt has mildy elevated fasting glucose and I discussed "preDM" with her. Her mother has diabetes and she is familiar with the disease. Discussed weight loss.    Habits & Providers  Alcohol-Tobacco-Diet     Tobacco Status: quit > 6 months     Tobacco Counseling: to quit use of tobacco products  Current Medications (verified): 1)  Hydrochlorothiazide 25 Mg Tabs (Hydrochlorothiazide) .... Take 1 Tablet By Mouth Every Morning 2)  Topiramate 50 Mg Tabs (Topiramate) .... Take 1 Pill Twice A Day (Morning and Evening) 3)  Celexa 20 Mg Tabs (Citalopram Hydrobromide) .Marland Kitchen.. 1 Tablet By Mouth Daily 4)  Seroquel 25 Mg Tabs (Quetiapine  Fumarate) .Marland Kitchen.. 1 Tablet By Mouth Qhs 5)  Klonopin 1 Mg Tabs (Clonazepam) .Marland Kitchen.. 1 Tablet By Mouth Bid 6)  Senokot 8.6 Mg Tabs (Sennosides) .... 2 Tabs By Mouth Daily For Constipation 7)  Cetirizine Hcl 10 Mg Tabs (Cetirizine Hcl) .Marland Kitchen.. 1 Tablet By Mouth At Bedtime 8)  Cvs Omeprazole 20 Mg Tbec (Omeprazole) .Marland Kitchen.. 1 Pill Daily 9)  Promethazine Hcl 12.5 Mg Tabs (Promethazine Hcl) .... Take One Pill By Mouth As Needed For Nausea 10)  Ibuprofen 800 Mg Tabs (Ibuprofen) .... Take One Pill Up To 3 Times A Day For Ankle Pain. Do No Exceed 3 Pills in One Day. 11)  Terbinafine Hcl 250 Mg Tabs (Terbinafine Hcl) .... Take 1 Pill Daily For 3 Months Then See Your Doctor. May Need Treatment Up To 6-9 Months. 12)  Nizoral 2 % Sham (Ketoconazole) .... Apply To Scalp 4 Times A Week. Please Dispense 2 Large Bottles. 13)  Gas Relief 80 Mg Chew (Simethicone) .Marland Kitchen.. 1 Tab Up To 4 Times A Day For Gas Relief 14)  Miralax  Powd (Polyethylene Glycol 3350) .... Take 1 Capful of Powder.  Allergies (verified): 1)  Meridia  Social History: Smoking Status:  quit > 6 months  Review of Systems        vitals reviewed and pertinent negatives and positives seen in HPI  Physical Exam  General:  Well-developed,well-nourished,in no acute distress; alert,appropriate and cooperative throughout examination Lungs:  Normal respiratory effort, chest expands symmetrically. Lungs are clear to auscultation, no crackles or wheezes. Heart:  Normal rate and regular rhythm. S1 and S2 normal without gallop, murmur, click, rub or other extra sounds.   Impression & Recommendations:  Problem # 1:  OBESITY, NOS (ICD-278.00) Assessment Deteriorated PT has gained 50 lbs in the last 1 year. She has a relative who has just been diagnosed with thyroid problems. She would like to be tested. This is reasonable as we have not tested her in a while and she has gained so much weight this year. She is not having cold intolerance or dry hair.  Pt needs to  be tested for liver funtion as well since she has been on terbinafine. Will also monitor blood glucose.   Orders: TSH-[facility] ([address]8119-14782) Comp Met-[facility] (95621-30865) [facility]- Est  Level 4 (78469)  Problem # 2:  INTESTINAL GAS (ICD-787.3) Assessment: New Pt says she has painful more frequent gas. She has not tried anything for it. I prescribed Simethicone for her gas pain.   Orders: [facility]- Est  Level 4 (99214)  Problem # 3:  ESOPHAGEAL REFLUX (ICD-530.81) Assessment: Unchanged Pt cont to c/o reflux. Unclear if she is taking it as prescribed but patient can increase it tw twice daily to see if it helps. Pt is obese and reflux would improve with weight loss.   Her updated medication list for this problem includes:    Cvs Omeprazole 20 Mg Tbec (Omeprazole) .Marland Kitchen... 1 pill daily  Orders: [facility]- Est  Level 4 (99214)  Problem # 4:  SEXUALLY TRANSMITTED DISEASE, EXPOSURE TO (ICD-V01.6) Assessment: Comment Only Pt concerned about STI exposure and wants to be tested. Will test for GC/Chlam at next visit.   Orders: RPR-[facility] (62952-84132) [facility]- Est  Level 4 (44010)  Problem # 5:  HIV EXPOSURE, POSSIBLE (ICD-V15.89) Assessment: Comment Only PT concerned about exposure and wants to get checked.   Orders: HIV-[facility] ([address]-66440) [facility]- Est  Level 4 (34742)  Complete Medication List: 1)  Hydrochlorothiazide 25 Mg Tabs (Hydrochlorothiazide) .... Take 1 tablet by mouth every morning 2)  Topiramate 50 Mg Tabs (Topiramate) .... Take 1 pill twice a day (morning and evening) 3)  Celexa 20 Mg Tabs (Citalopram hydrobromide) .Marland Kitchen.. 1 tablet by mouth daily 4)  Seroquel 25 Mg Tabs (Quetiapine fumarate) .Marland Kitchen.. 1 tablet by mouth qhs 5)  Klonopin 1 Mg Tabs (Clonazepam) .Marland Kitchen.. 1 tablet by mouth bid 6)  Senokot 8.6 Mg Tabs (Sennosides) .... 2 tabs by mouth daily for constipation 7)  Cetirizine Hcl 10 Mg Tabs (Cetirizine hcl) .Marland Kitchen.. 1 tablet by mouth at bedtime 8)  Cvs Omeprazole 20 Mg Tbec (Omeprazole) .Marland Kitchen.. 1 pill daily 9)   Promethazine Hcl 12.5 Mg Tabs (Promethazine hcl) .... Take one pill by mouth as needed for nausea 10)  Ibuprofen 800 Mg Tabs (Ibuprofen) .... Take one pill up to 3 times a day for ankle pain. do no exceed 3 pills in one day. 11)  Terbinafine Hcl 250 Mg Tabs (Terbinafine hcl) .... Take 1 pill daily for 3 months then see your doctor. may need treatment up to 6-9 months. 12)  Nizoral 2 % Sham (Ketoconazole) .... Apply to scalp 4 times a week. please dispense 2 large bottles. 13)  Gas Relief 80 Mg Chew (Simethicone) .Marland Kitchen.. 1 tab up to 4 times a day for gas relief 14)  Miralax Powd (Polyethylene glycol 3350) .... Take 1 capful  of powder.  Patient Instructions: 1)  come back in 1 month for a pelvic exam. 2)  We will check your thyroid and liver funtion today.  3)  We can discuss them next time.  Prescriptions: MIRALAX  POWD (POLYETHYLENE GLYCOL 3350) take 1 capful of powder.  #999 x 3   Entered and Authorized by:   Jamie Brookes MD   Signed by:   Jamie Brookes MD on 07/16/2009   Method used:   Faxed to ...       [facility] [employer] (mail-order)       [address] [place], [place]  [address]       Ph: [phone]       Fax: [phone]   RxID:   [id number] GAS RELIEF 80 MG CHEW (SIMETHICONE) 1 tab up to 4 times a day for gas relief  #120 x 4   Entered and Authorized by:   Jamie Brookes MD   Signed by:   Jamie Brookes MD on 07/16/2009   Method used:   Faxed to ...       [facility] [employer] [facility] (mail-order)       [address] [place], [place]  [address]       Ph: [phone]       Fax: [phone]   RxID:   [id number] TOPIRAMATE 50 MG TABS (TOPIRAMATE) take 1 pill twice a day (morning and evening)  #64 x 5   Entered and Authorized by:   Jamie Brookes MD   Signed by:   Jamie Brookes MD on 07/16/2009   Method used:   Faxed to ...       [facility] [employer] [facility] (mail-order)       [address] [place], [place]  [address]       Ph: [phone]       Fax: [phone]    RxID:   [id number]

## 2010-07-12 NOTE — Miscellaneous (Signed)
Summary: Tpoiramate and Tramadol/APAP refill  Clinical Lists Changes  Medications: Changed medication from ULTRACET 37.5-325 MG TABS (TRAMADOL-ACETAMINOPHEN) take 2 tablets every 6 hours as needed for pain to ULTRACET 37.5-325 MG TABS (TRAMADOL-ACETAMINOPHEN) take 2 tablets every 6 hours as needed for pain - Signed Changed medication from TOPIRAMATE 50 MG TABS (TOPIRAMATE) take 1 pill twice a day (morning and evening) to TOPIRAMATE 50 MG TABS (TOPIRAMATE) take 1 pill twice a day (morning and evening) - Signed Rx of ULTRACET 37.5-325 MG TABS (TRAMADOL-ACETAMINOPHEN) take 2 tablets every 6 hours as needed for pain;  #120 x 6;  Signed;  Entered by: Jamie Brookes MD;  Authorized by: Jamie Brookes MD;  Method used: Historical Rx of TOPIRAMATE 50 MG TABS (TOPIRAMATE) take 1 pill twice a day (morning and evening);  #64 x 6;  Signed;  Entered by: Jamie Brookes MD;  Authorized by: Jamie Brookes MD;  Method used: Historical    Prescriptions: TOPIRAMATE 50 MG TABS (TOPIRAMATE) take 1 pill twice a day (morning and evening)  #64 x 6   Entered and Authorized by:   Jamie Brookes MD   Signed by:   Jamie Brookes MD on 12/30/2009   Method used:   Historical   RxID:   [id number] ULTRACET 37.5-325 MG TABS (TRAMADOL-ACETAMINOPHEN) take 2 tablets every 6 hours as needed for pain  #120 x 6   Entered and Authorized by:   Jamie Brookes MD   Signed by:   Jamie Brookes MD on 12/30/2009   Method used:   Historical   RxID:   [id number]

## 2010-07-12 NOTE — Progress Notes (Signed)
  Phone Note Call from Patient   Summary of Call: Day before yesterday patient began experiencing coughing and increased nasal congestion and drainage.  Consistently worsening.  Has history of high blood pressure, did not know what to take.  Recommended nasal saline sprays, Robitussin for cough.  Continue Cetirizine for nasal congestion.  Recommended call to be seen if no improvement in 1-2 days.  Patient expresses understanding and appreciative.   Initial call taken by: Renold Don MD,  February 27, 2010 12:45 PM

## 2010-07-14 NOTE — Assessment & Plan Note (Signed)
Summary: flu symptoms/Depression/Anxiety   Vital Signs:  Patient profile:   35 year old female Height:      73 inches Weight:      495 pounds BMI:     65.54 Temp:     98.5 degrees F oral Pulse rate:   86 / minute BP sitting:   128 / 79  (left arm)  Vitals Entered By: Tessie Fass CMA (June 28, 2010 10:36 AM) CC: flu symptoms x 10 days, anxiety/stress Pain Assessment Patient in pain? no        Primary Care Provider:  Jamie Brookes MD  CC:  flu symptoms x 10 days and anxiety/stress.  History of Present Illness:  Flu symptoms- body aches, chills and subjective fever for 10 days, + cough mild production, keeps her up at night, pain with cough in sides, no chest pain, no SOB, no change in stools, +sore throat, no known sick contacts   Anxiety/depression- wants meds increased, states the dosing is not helping, reviewed meds, pt no longer follows with mental health her PCP manages her meds. States she is stressed and has a lot of things running in her head, kids are giving her trouble no specifics, she is waiting to move to another home but housing authority is still pending, her home is in disarray and finances are difficult. Currently not in a relationship. No SI, not sleeping well, +crying spells , +anxiety, shakes and palpitations when she gets overwhelmed, would not give specific examples states everything is stressful Pt on disability  Current Medications (verified): 1)  Hydrochlorothiazide 25 Mg Tabs (Hydrochlorothiazide) .... Take 1 Tablet By Mouth Every Morning 2)  Topiramate 50 Mg Tabs (Topiramate) .... Take 1 Pill Twice A Day (Morning and Evening) 3)  Celexa 20 Mg Tabs (Citalopram Hydrobromide) .Marland Kitchen.. 1 Tablet By Mouth Daily 4)  Seroquel 25 Mg Tabs (Quetiapine Fumarate) .Marland Kitchen.. 1 Tablet By Mouth Qhs 5)  Clonazepam 2 Mg Tabs (Clonazepam) .... Take 1 Tablet Two Times A Day For Anxiety 6)  Senokot 8.6 Mg Tabs (Sennosides) .... 2 Tabs By Mouth Daily For Constipation 7)   Cetirizine Hcl 10 Mg Tabs (Cetirizine Hcl) .Marland Kitchen.. 1 Tablet By Mouth At Bedtime 8)  Cvs Omeprazole 20 Mg Tbec (Omeprazole) .Marland Kitchen.. 1 Pill Daily 9)  Promethazine Hcl 12.5 Mg Tabs (Promethazine Hcl) .... Take One Pill By Mouth As Needed For Nausea 10)  Terbinafine Hcl 250 Mg Tabs (Terbinafine Hcl) .... Take 1 Pill Daily For 3 Months Then See Your Doctor. May Need Treatment Up To 6-9 Months. 11)  Nizoral 2 % Sham (Ketoconazole) .... Apply To Scalp 4 Times A Week. Please Dispense 2 Large Bottles. 12)  Gas Relief 80 Mg Chew (Simethicone) .Marland Kitchen.. 1 Tab Up To 4 Times A Day For Gas Relief 13)  Miralax  Powd (Polyethylene Glycol 3350) .... Take 1 Capful of Powder. 14)  Lamisil At 1 % Crea (Terbinafine Hcl) .... Apply To Face Areas Two Times A Day  Give 1 Large Tube 15)  Guiatuss Ac 100-10 Mg/67ml Syrp (Guaifenesin-Codeine) .... Take  2 Teaspoons By Mouth Q 4 Hours As Needed Cough Qs 30 Days  Allergies (verified): 1)  Meridia        Past History:  Past Medical History: Last updated: 11/21/2007 H. pylori neg 7/04,  SVD - 4/98, 10/99, 1/04, 2006 Current Problems:  ALOPECIA (ICD-704.00) HYPERTENSION, BENIGN ESSENTIAL (ICD-401.1) SINUSITIS-CHRONIC (ICD-473.9) OBESITY, NOS (ICD-278.00) HEADACHE, UNSPECIFIED (ICD-784.0) DEPRESSION, MAJOR, RECURRENT (ICD-296.30)     Review of Systems  Per HPI  Physical Exam  General:  , morbidly obese Vital signs noted Crying throughout exam  Eyes:  clear discharge from right eye PERRL , EOMI Conjunctiva clear Nose:  nares clear Mouth:  oropharynx clear, MMM Neck:  supple Lungs:  Normal respiratory effort, chest expands symmetrically. Lungs are clear to auscultation, no crackles or wheezes. Heart:  RRR Psych:  depressed appearing very flat affect  cyring on exam, no hallunciations noted oriented x 3 memory in tact    Impression & Recommendations:  Problem # 1:  UPPER RESPIRATORY INFECTION, VIRAL (ICD-465.9) Assessment New  Likley viral illness,  possibly flu like pt did recieve vaccine, out of window for Tamiflu, treat with supportive care Her updated medication list for this problem includes:    Cetirizine Hcl 10 Mg Tabs (Cetirizine hcl) .Marland Kitchen... 1 tablet by mouth at bedtime    Promethazine Hcl 12.5 Mg Tabs (Promethazine hcl) .Marland Kitchen... Take one pill by mouth as needed for nausea    Guiatuss Ac 100-10 Mg/70ml Syrp (Guaifenesin-codeine) .Marland Kitchen... Take  2 teaspoons by mouth q 4 hours as needed cough qs 30 days  Orders: [facility]- Est  Level 4 (16109)  Problem # 2:  DEPRESSION, MAJOR, RECURRENT (ICD-296.30) Assessment: Deteriorated  Pt with history of Bipolar per report, Appears very much so Depressed today, appears to be on a regimine for Bipolar with seroquel,  she was not very forthcoming regarding the change in her symptoms and her stressors at home, this may be because I am not her PCP and this was our first visit as a work in. I am concerned enough with her sleep and symptoms of anxiety, to increase her Seroquel to 50mg  this should also help her sleep  and her Clonazepam for a short course to three times a day dosing during her adjustment with housing. See instructions Pt to f/u with PCP next week  contracted for saftey  Orders: [facility]- Est  Level 4 (60454)  Complete Medication List: 1)  Hydrochlorothiazide 25 Mg Tabs (Hydrochlorothiazide) .... Take 1 tablet by mouth every morning 2)  Topiramate 50 Mg Tabs (Topiramate) .... Take 1 pill twice a day (morning and evening) 3)  Celexa 20 Mg Tabs (Citalopram hydrobromide) .Marland Kitchen.. 1 tablet by mouth daily 4)  Seroquel 25 Mg Tabs (Quetiapine fumarate) .Marland Kitchen.. 1 tablet by mouth qhs 5)  Clonazepam 2 Mg Tabs (Clonazepam) .... Take 1 tablet two times a day for anxiety 6)  Senokot 8.6 Mg Tabs (Sennosides) .... 2 tabs by mouth daily for constipation 7)  Cetirizine Hcl 10 Mg Tabs (Cetirizine hcl) .Marland Kitchen.. 1 tablet by mouth at bedtime 8)  Cvs Omeprazole 20 Mg Tbec (Omeprazole) .Marland Kitchen.. 1 pill daily 9)  Promethazine Hcl 12.5 Mg  Tabs (Promethazine hcl) .... Take one pill by mouth as needed for nausea 10)  Terbinafine Hcl 250 Mg Tabs (Terbinafine hcl) .... Take 1 pill daily for 3 months then see your doctor. may need treatment up to 6-9 months. 11)  Nizoral 2 % Sham (Ketoconazole) .... Apply to scalp 4 times a week. please dispense 2 large bottles. 12)  Gas Relief 80 Mg Chew (Simethicone) .Marland Kitchen.. 1 tab up to 4 times a day for gas relief 13)  Miralax Powd (Polyethylene glycol 3350) .... Take 1 capful of powder. 14)  Lamisil At 1 % Crea (Terbinafine hcl) .... Apply to face areas two times a day  give 1 large tube 15)  Guiatuss Ac 100-10 Mg/50ml Syrp (Guaifenesin-codeine) .... Take  2 teaspoons by mouth q 4 hours as  needed cough qs 30 days  Patient Instructions: 1)  Drink plenty of fluids, small meals also help 2)  For your cold you can try the cough medication 3)  Increase your Clonazepam to 2mg  three times a day. Start this tomorrow. Try to make sure they are at least 7-8 hours apart, this only as needed  4)  Increase the seroquel Friday to 50mg  at night, take 2 tablets 5)  If you have high fever, have difficulty breathing or chest pain, come back in to be seen before that or go to ER. 6)  If you get to sleepy, decrease your Clonazempam back to twice a day 7)  Make a follow-up appt with Dr. Clotilde Dieter on Wed  Jan 25th to follow up your mood and medication changes  Prescriptions: GUIATUSS AC 100-10 MG/5ML SYRP (GUAIFENESIN-CODEINE) Take  2 teaspoons by mouth q 4 hours as needed cough QS 30 days  #1 x 0   Entered and Authorized by:   Milinda Antis MD   Signed by:   Milinda Antis MD on 06/28/2010   Method used:   Telephoned to ...       [facility]  [address] [phone]* (retail)       [address]       [place], [place]  [id number]       Ph: [phone] or [phone]       Fax: [phone]   RxID:   [id number]    Orders Added: 1)  [facility]- Est  Level 4 [40347]

## 2010-07-14 NOTE — Assessment & Plan Note (Signed)
Summary: NP FOR Karen Brennan AT 9:45AM/KH   Vital Signs:  Patient profile:   35 year old female Height:      73 inches Weight:      496.2 pounds BMI:     65.70  Vitals Entered By: Wyona Almas PHD (June 28, 2010 9:39 AM)  Primary Care Provider:  Jamie Brookes MD   History of Present Illness: Assessment:  Spent 45 min w/ pt.  Appt shortened b/c Karen Brennan is not feeling well, and is awaiting a work-in physician visit.  Eating pattern is erratic.  Everyday foods/beverages include rice, veg's, water.  Changes [name] has made since  ~1 yr ago include decreasing soda and juice, increasing flavored water; no snack and sugary foods kept at home.  She also is now baking instead of frying.  Exercise (walking) has been intermittent.   She started walking  ~30 min a few days ago, but has stopped due to recent sickness.  No 24-hr recall b/c intake was abnormal yesterday due to sickness.  Typical eating:  Special K cereal w/ sk milk, water; 4 oz Activia for snack; baked chx wings, veg's, rice; 4 oz Activia for snack; ditto dinner.  No snack after dinner usually b/c of reflux.  Despite pt's efforts at behavior changes, wt is actually up  ~8 lb from Jan 2011.    Nutrition Diagnosis:  Physical inactivity (NB-2.1) related to low motivation as well as orthopedic problems as evidenced by pt report of little/no exercise.  Cannot evaluate other nutr diagnoses at this time, as history was abbreviated; pt completion of food record will provide useful info for next visit.    Intervention: See Patient Instructions.    Monitoring/Eval:  Dietary intake, body weight, and exercise at 4-wk F/U.    Allergies: 1)  Meridia   Complete Medication List: 1)  Hydrochlorothiazide 25 Mg Tabs (Hydrochlorothiazide) .... Take 1 tablet by mouth every morning 2)  Topiramate 50 Mg Tabs (Topiramate) .... Take 1 pill twice a day (morning and evening) 3)  Celexa 20 Mg Tabs (Citalopram hydrobromide) .Marland Kitchen.. 1 tablet by mouth daily 4)   Seroquel 25 Mg Tabs (Quetiapine fumarate) .Marland Kitchen.. 1 tablet by mouth qhs 5)  Clonazepam 2 Mg Tabs (Clonazepam) .... Take 1 tablet two times a day for anxiety 6)  Senokot 8.6 Mg Tabs (Sennosides) .... 2 tabs by mouth daily for constipation 7)  Cetirizine Hcl 10 Mg Tabs (Cetirizine hcl) .Marland Kitchen.. 1 tablet by mouth at bedtime 8)  Cvs Omeprazole 20 Mg Tbec (Omeprazole) .Marland Kitchen.. 1 pill daily 9)  Promethazine Hcl 12.5 Mg Tabs (Promethazine hcl) .... Take one pill by mouth as needed for nausea 10)  Terbinafine Hcl 250 Mg Tabs (Terbinafine hcl) .... Take 1 pill daily for 3 months then see your doctor. may need treatment up to 6-9 months. 11)  Nizoral 2 % Sham (Ketoconazole) .... Apply to scalp 4 times a week. please dispense 2 large bottles. 12)  Gas Relief 80 Mg Chew (Simethicone) .Marland Kitchen.. 1 tab up to 4 times a day for gas relief 13)  Miralax Powd (Polyethylene glycol 3350) .... Take 1 capful of powder. 14)  Lamisil At 1 % Crea (Terbinafine hcl) .... Apply to face areas two times a day  give 1 large tube  Other Orders: Inital Assessment Each - [facility] (04540)  Patient Instructions: 1)  Eat at least 3 meals and 1-2 snacks per day.  No more than 5 hours between eating. 2)  Obtain twice as many veg's as protein or  carbohydrate foods for both lunch and dinner.  3)  Talk to Dr. Clotilde Dieter about sleep problems.   4)  When you feel better, get back to walking.  Consider exploring water exercise:  Call [facility] [phone], [facility] ([place]) [employer] (ask about scholarships) [phone], and [employer], community [employer] pool).   5)  Track your food intake in a written food diary provided today.   6)  Please schedule an appt with Dr. Clotilde Dieter and a follow-up nutrition appt for about 1 month from now.   7)  Dr. Gerilyn Pilgrim:  [phone].     Orders Added: 1)  Inital Assessment Each - [facility] [47425]

## 2010-07-14 NOTE — Assessment & Plan Note (Signed)
Summary: discuss lap band surgery/bmc   Vital Signs:  Patient profile:   35 year old female Height:      73 inches Weight:      494.5 pounds BMI:     65.48 Temp:     97.9 degrees F oral Pulse rate:   106 / minute BP sitting:   131 / 82  (left arm) Cuff size:   large  Vitals Entered By: Jimmy Footman, CMA (June 15, 2010 9:53 AM)  Nutrition Counseling: Patient's BMI is greater than 25 and therefore counseled on weight management options. CC: lapband discussion Is Patient Diabetic? No Pain Assessment Patient in pain? no        Primary Care Provider:  Jamie Brookes MD  CC:  lapband discussion.  History of Present Illness: Pt is here to complete forms and discuss her lapband procedure. She is required to have 6 documented appointments with me before her surgery. She is to get an exercise plan, diet plans and weight reassessments amoung other things in the next 6 months. She is very excited to be starting down this road. She was sure she would gain weight over the holidays but is almost the exact same weight. (up 0.5 lbs).  Today she was given a list of food high in fiber and explained why there are good for her. She was also given an Exercise Rx(: walking 5 min 5 times a week, increasing by 5 min weekly to be 30 min of walking 5 times a week in 6 weeks.) and  1800 calorie Diabetic Diet sheet given to help her understand portion sizes [employer] from online).  She says she  has ben drinking water like crazy and thinks this has helped her not gain more weight.    Habits & Providers  Alcohol-Tobacco-Diet     Tobacco Status: never  Current Medications (verified): 1)  Hydrochlorothiazide 25 Mg Tabs (Hydrochlorothiazide) .... Take 1 Tablet By Mouth Every Morning 2)  Topiramate 50 Mg Tabs (Topiramate) .... Take 1 Pill Twice A Day (Morning and Evening) 3)  Celexa 20 Mg Tabs (Citalopram Hydrobromide) .Marland Kitchen.. 1 Tablet By Mouth Daily 4)  Seroquel 25 Mg Tabs (Quetiapine Fumarate) .Marland Kitchen..  1 Tablet By Mouth Qhs 5)  Clonazepam 2 Mg Tabs (Clonazepam) .... Take 1 Tablet Two Times A Day For Anxiety 6)  Senokot 8.6 Mg Tabs (Sennosides) .... 2 Tabs By Mouth Daily For Constipation 7)  Cetirizine Hcl 10 Mg Tabs (Cetirizine Hcl) .Marland Kitchen.. 1 Tablet By Mouth At Bedtime 8)  Cvs Omeprazole 20 Mg Tbec (Omeprazole) .Marland Kitchen.. 1 Pill Daily 9)  Promethazine Hcl 12.5 Mg Tabs (Promethazine Hcl) .... Take One Pill By Mouth As Needed For Nausea 10)  Terbinafine Hcl 250 Mg Tabs (Terbinafine Hcl) .... Take 1 Pill Daily For 3 Months Then See Your Doctor. May Need Treatment Up To 6-9 Months. 11)  Nizoral 2 % Sham (Ketoconazole) .... Apply To Scalp 4 Times A Week. Please Dispense 2 Large Bottles. 12)  Gas Relief 80 Mg Chew (Simethicone) .Marland Kitchen.. 1 Tab Up To 4 Times A Day For Gas Relief 13)  Miralax  Powd (Polyethylene Glycol 3350) .... Take 1 Capful of Powder. 14)  Lamisil At 1 % Crea (Terbinafine Hcl) .... Apply To Face Areas Two Times A Day  Give 1 Large Tube  Allergies (verified): 1)  Meridia  Physical Exam  General:  Well-developed,well-nourished,in no acute distress; alert,appropriate and cooperative throughout examination, morbidly obese Psych:  Cognition and judgment appear intact. Alert and cooperative with  normal attention span and concentration. No apparent delusions, illusions, hallucinations   Impression & Recommendations:  Problem # 1:  OBESITY, NOS (ICD-278.00) Assessment Unchanged Pt's weight is almost the same as her last visit. (0.5 lb up) and she just went through the holidays so I am pleased with her. She went to the lap band appointment Dec 16th and has started the process to get it. Over the next 6 months we will assist her in getting her results.  Given: Fiber foods sheet 1800 Cal diet ideas/portion sizes sheet Exercise prescription for working up to 30 min of walking a day.   Orders: [facility]- Est Level  3 (16109)  Complete Medication List: 1)  Hydrochlorothiazide 25 Mg Tabs  (Hydrochlorothiazide) .... Take 1 tablet by mouth every morning 2)  Topiramate 50 Mg Tabs (Topiramate) .... Take 1 pill twice a day (morning and evening) 3)  Celexa 20 Mg Tabs (Citalopram hydrobromide) .Marland Kitchen.. 1 tablet by mouth daily 4)  Seroquel 25 Mg Tabs (Quetiapine fumarate) .Marland Kitchen.. 1 tablet by mouth qhs 5)  Clonazepam 2 Mg Tabs (Clonazepam) .... Take 1 tablet two times a day for anxiety 6)  Senokot 8.6 Mg Tabs (Sennosides) .... 2 tabs by mouth daily for constipation 7)  Cetirizine Hcl 10 Mg Tabs (Cetirizine hcl) .Marland Kitchen.. 1 tablet by mouth at bedtime 8)  Cvs Omeprazole 20 Mg Tbec (Omeprazole) .Marland Kitchen.. 1 pill daily 9)  Promethazine Hcl 12.5 Mg Tabs (Promethazine hcl) .... Take one pill by mouth as needed for nausea 10)  Terbinafine Hcl 250 Mg Tabs (Terbinafine hcl) .... Take 1 pill daily for 3 months then see your doctor. may need treatment up to 6-9 months. 11)  Nizoral 2 % Sham (Ketoconazole) .... Apply to scalp 4 times a week. please dispense 2 large bottles. 12)  Gas Relief 80 Mg Chew (Simethicone) .Marland Kitchen.. 1 tab up to 4 times a day for gas relief 13)  Miralax Powd (Polyethylene glycol 3350) .... Take 1 capful of powder. 14)  Lamisil At 1 % Crea (Terbinafine hcl) .... Apply to face areas two times a day  give 1 large tube  Patient Instructions: 1)  I am giving you a 1800 calorie Diabetic Diet sheet to help you understand portion sizes.  2)  Also you got a sheet on Fiber today. Try to get 35 g of fiber a day.  3)  Keep drinking lots of water.  4)  Start the exercise program as outlined in the exercise Rx.  5)  I will see you in 3-4 weeks for another weight check-up.  6)  Make an appointment with Dr. Gerilyn Pilgrim (here in our office) at the front desk when you leave for more nutrion counseling.    Orders Added: 1)  [facility]- Est Level  3 [60454]

## 2010-07-14 NOTE — Consult Note (Signed)
Summary: [facility]  requirements for each visit  [facility]   Imported By: Bradly Bienenstock 06/21/2010 10:54:13  _____________________________________________________________________  External Attachment:    Type:   Image     Comment:   External Document

## 2010-07-14 NOTE — Progress Notes (Signed)
  Phone Note Call from Patient   Summary of Call: Accidently took two HCTZ this evening.  Just now realised that she accidently took the extra medication. She feels well and denies any dizzyness of weakness. She lives with her teenage children.  Red flags reviewed. Will call back if feeling symptoms.  Initial call taken by: Clementeen Graham MD,  July 07, 2010 3:39 AM

## 2010-07-18 ENCOUNTER — Ambulatory Visit (INDEPENDENT_AMBULATORY_CARE_PROVIDER_SITE_OTHER): Payer: Medicaid Other | Admitting: Family Medicine

## 2010-07-18 ENCOUNTER — Other Ambulatory Visit: Payer: Self-pay | Admitting: Family Medicine

## 2010-07-18 ENCOUNTER — Encounter: Payer: Self-pay | Admitting: Family Medicine

## 2010-07-18 DIAGNOSIS — M25579 Pain in unspecified ankle and joints of unspecified foot: Secondary | ICD-10-CM

## 2010-07-18 DIAGNOSIS — E669 Obesity, unspecified: Secondary | ICD-10-CM

## 2010-07-19 ENCOUNTER — Encounter: Payer: Self-pay | Admitting: Family Medicine

## 2010-07-19 LAB — HIV ANTIBODY (ROUTINE TESTING W REFLEX): HIV: NONREACTIVE

## 2010-07-26 ENCOUNTER — Encounter: Payer: Self-pay | Admitting: Family Medicine

## 2010-07-28 NOTE — Assessment & Plan Note (Signed)
Summary: knee/ankle pain, obesity   Vital Signs:  Patient profile:   35 year old female Height:      73 inches Weight:      479 pounds Temp:     97.9 degrees F oral Pulse rate:   84 / minute BP sitting:   131 / 91  (left arm) Cuff size:   large  Vitals Entered By: Loralee Pacas CMA (July 18, 2010 9:02 AM) CC: weight loss Is Patient Diabetic? No Comments pt has been exp headaches and Left leg swelling   Primary Care Provider:  Jamie Brookes MD  CC:  weight loss.  History of Present Illness:  Left ankle and Knee pain: Pt had surgery on her left ankle at age 42 and has pain in the ankle. The pain has worsened in the last 10 days since she moved to a new place to live. She also has some knee pain that radiates from the ankle up. She thinks she felt a pop in the knee but says it pops a lot. She thinks there is some swelling in the knee and knows there is some in the ankle.   Obesity: Pt has been going through the [facility] lap band surgery protocol. She is losing weight and doing very well with it. She has started doing some session with Nutritionist Dr. Vickki Muff. She has lost 16 lbs since her last visit. She has recently been told that they are no longer offering the program to people outside of [facility]. She is very sad and depressed about it but is still hopeful to somehow get in the program. I have encouraged her to call and see what she can work out.    Review of Systems  Constitutional: Positive for weight loss and malaise/fatigue. Negative for fever.  Musculoskeletal: Positive for back pain and joint pain. Negative for falls.  Psychiatric/Behavioral: Positive for depression.     Allergies: 1)  Meridia  Physical Exam  General:  Well-developed,well-nourished,in no acute distress; alert,appropriate and cooperative throughout examination, pt has lost some weight.  Msk:  Left and Right knee evaluated. No swelling noted, no grinding, popping or clicking in the knee  joints, decreased ROM on the left knee. Pt notes back pain with knee flexion. Left ankle lateral scar noted. moderate swelling noted over area of scar. Minimal tenderness over ankle joint but limp with walking.  Psych:  depressed affect.     Impression & Recommendations:  Problem # 1:  ANKLE PAIN, LEFT (ICD-719.47) Assessment Deteriorated Pt has worsening ankle pain. She is trying not to use Motrin for the pain since she was told by the [facility] people not to use Motrin before the surgery. She has taken 2 Tylenol #3 she had left and they helped her pain. She also wanted to know if we had a brace. Rx for ASO brace given and ankle was ACE bandage wrapped in the office today. Pt should elevate the ankle as much as possible and can use heating pad on the joints that hurt as needed. The ankle and knee pain appear to be a chronich problem with a new flare of pain ignited by her recent move.   Problem # 2:  OBESITY, NOS (ICD-278.00) Assessment: Improved  pt has lost 16 lbs. She is doing well. She feels a little discouraged because she is not sure she will be able to get the surgery now, but is going to keep trying.   Complete Medication List: 1)  Hydrochlorothiazide 25 Mg Tabs (  Hydrochlorothiazide) .... Take 1 tablet by mouth every morning 2)  Topiramate 50 Mg Tabs (Topiramate) .... Take 1 pill twice a day (morning and evening) 3)  Celexa 20 Mg Tabs (Citalopram hydrobromide) .Marland Kitchen.. 1 tablet by mouth daily 4)  Seroquel 25 Mg Tabs (Quetiapine fumarate) .Marland Kitchen.. 1 tablet by mouth qhs 5)  Clonazepam 2 Mg Tabs (Clonazepam) .... Take 1 tablet two times a day for anxiety 6)  Senokot 8.6 Mg Tabs (Sennosides) .... 2 tabs by mouth daily for constipation 7)  Cetirizine Hcl 10 Mg Tabs (Cetirizine hcl) .Marland Kitchen.. 1 tablet by mouth at bedtime 8)  Cvs Omeprazole 20 Mg Tbec (Omeprazole) .Marland Kitchen.. 1 pill daily 9)  Promethazine Hcl 12.5 Mg Tabs (Promethazine hcl) .... Take one pill by mouth as needed for nausea 10)  Terbinafine Hcl 250  Mg Tabs (Terbinafine hcl) .... Take 1 pill daily for 3 months then see your doctor. may need treatment up to 6-9 months. 11)  Nizoral 2 % Sham (Ketoconazole) .... Apply to scalp 4 times a week. please dispense 2 large bottles. 12)  Gas Relief 80 Mg Chew (Simethicone) .Marland Kitchen.. 1 tab up to 4 times a day for gas relief 13)  Miralax Powd (Polyethylene glycol 3350) .... Take 1 capful of powder. 14)  Lamisil At 1 % Crea (Terbinafine hcl) .... Apply to face areas two times a day  give 1 large tube 15)  Tylenol With Codeine #3 300-30 Mg Tabs (Acetaminophen-codeine) .... Take 1 pill every 6 hours as needed for pain  Other Orders: [facility] ([id number])  Patient Instructions: 1)  I am going to pray that you are able to get this surgery.  2)  Keep calling them at [phone] 3)  Use elevation and a heating pad if you leg is hurting a lot.  4)  Use the Tylenol with codien only as needed.  5)  Try to go to [facility] supply to get the ankle brace or use the ace bandage wrapped for support. 6)  Come back for an appointment for a breast exam.  Prescriptions: TYLENOL WITH CODEINE #3 300-30 MG TABS (ACETAMINOPHEN-CODEINE) take 1 pill every 6 hours as needed for pain  #30 x 0   Entered and Authorized by:   Jamie Brookes MD   Signed by:   Jamie Brookes MD on 07/18/2010   Method used:   Printed then faxed to ...       [facility] (mail-order)       [address] [place], [place]  [address]       Ph: [phone]       Fax: [phone]   RxID:   [id number]    Orders Added: 1)  [facility] [[id number]]  Appended Document: knee/ankle pain, obesity    Clinical Lists Changes  Orders: Added new Test order of [facility] Level  3 ([id number]) - Signed

## 2010-07-28 NOTE — Miscellaneous (Signed)
Summary: Klonopin refill  Clinical Lists Changes  Refaxed to her pharmacy. Jamie Brookes MD  July 19, 2010 5:04 PM   Medications: Changed medication from CLONAZEPAM 2 MG TABS (CLONAZEPAM) take 1 tablet two times a day for anxiety to CLONAZEPAM 2 MG TABS (CLONAZEPAM) take 1 tablet two times a day for anxiety - Signed Rx of CLONAZEPAM 2 MG TABS (CLONAZEPAM) take 1 tablet two times a day for anxiety;  #62 x 0;  Signed;  Entered by: Jamie Brookes MD;  Authorized by: Jamie Brookes MD;  Method used: Historical    Prescriptions: CLONAZEPAM 2 MG TABS (CLONAZEPAM) take 1 tablet two times a day for anxiety  #62 x 0   Entered and Authorized by:   Jamie Brookes MD   Signed by:   Jamie Brookes MD on 07/19/2010   Method used:   Historical   RxID:   [id number]

## 2010-08-01 ENCOUNTER — Encounter: Payer: Self-pay | Admitting: Family Medicine

## 2010-08-01 ENCOUNTER — Ambulatory Visit (INDEPENDENT_AMBULATORY_CARE_PROVIDER_SITE_OTHER): Payer: Medicaid Other | Admitting: Family Medicine

## 2010-08-01 ENCOUNTER — Other Ambulatory Visit: Payer: Self-pay | Admitting: Family Medicine

## 2010-08-01 VITALS — BP 133/76 | HR 98 | Temp 98.2°F | Ht 73.0 in | Wt >= 6400 oz

## 2010-08-01 DIAGNOSIS — E669 Obesity, unspecified: Secondary | ICD-10-CM

## 2010-08-01 DIAGNOSIS — M25561 Pain in right knee: Secondary | ICD-10-CM | POA: Insufficient documentation

## 2010-08-01 DIAGNOSIS — M25569 Pain in unspecified knee: Secondary | ICD-10-CM

## 2010-08-01 NOTE — Assessment & Plan Note (Signed)
Pain started after she moved. She has had pain in the knee ever since. She has use some Tylenol # 3 with success and some Advil (although she was asked not to take Advil by [facility] when she was being worked up there for the bariatric surgery so she is reluctant to take it very often.) Wrote for knee brace to stabilize the knee and give her confidence to move on it. Also discussed that with weight loss her knees will feel even better.

## 2010-08-01 NOTE — Assessment & Plan Note (Signed)
Pt has switched over to the [facility] system to start the work up for bariatric surgery. She has not done the info session yet but did get some material in the mail. She is continuing to see me for weight loss. She was doing very well before she hurt her Rt knee and left ankle while moving. She has gained all her weight back and more. She is heavier than she has ever been. She has been working with our in house nutritionist to come up with a plan. She has an appointment with her this week on Thursday. I have asked her to work with Dr. Gerilyn Pilgrim to get a recommended number of calories per day. Also, she was given an exercise prescription 2 visits ago that outlined getting 5 more minutes of exercise a week. She was doing 30 min of exercise a day before she hurt her ankle. Encouraged her to get back into exercising and possibly join the [facility].

## 2010-08-01 NOTE — Patient Instructions (Signed)
It is good to see you today.  I think that going to [facility] is going to be a great experience for you.  Talk to Dr. Gerilyn Pilgrim about how many calories you should get a day.  Continue to try to walk even if it is only 5-10 min a day.  Consider looking into joining the [facility] so you could do the stationary bike which has less pressure on your knees.  We will send a copy of your labs to the [facility] along with your last few visits including your last physical exam.

## 2010-08-01 NOTE — Progress Notes (Signed)
  Subjective:    Patient ID: Karen Brennan, female    DOB: 25-Jun-1975, 35 y.o.   MRN: [id number]  HPI Rt knee pain: Pt is still having some Rt knee pain. She developed pain in her left ankle, Rt knee and legs after moving and lifting heavy furniture. She has not been able to exercise since the move because of the pain. She says that she needs an Rx to get a brace for the Rt knee.   Obesity: Pt has gained back the weight she lost before the move. She had been very excited and motivated about the [facility] bariatric surgery opportunity and then when they told her she could not have it done she felt depressed. She says she eats when she is depressed. She was then given the phone number for [facility]'s bariatric surgery center. She is currently starting the process with them to get the surgery done. She has brought a Physicist, medical for me from [place] outlining some of the requirements. Her last weight was 479 on 07/18/10 and now it is 502 lbs today. She is very depressed about her weight. She is going to meet with Dr. Gerilyn Pilgrim and address the question of "how many calories should she have a day". Pt is not currently on meds for weight loss.     Review of Systems Neg except as noted in the HPI.      Objective:   Physical Exam  Musculoskeletal:       Legs: Psychiatric: Judgment and thought content normal. Her speech is delayed. She is slowed and withdrawn. Cognition and memory are normal. She exhibits a depressed mood.          Assessment & Plan:

## 2010-08-02 ENCOUNTER — Telehealth: Payer: Self-pay | Admitting: Family Medicine

## 2010-08-02 LAB — CONVERTED CEMR LAB
Chloride: 101 meq/L (ref 96–112)
Cholesterol: 155 mg/dL (ref 0–200)
Glucose, Bld: 105 mg/dL — ABNORMAL HIGH (ref 70–99)
LDL Cholesterol: 87 mg/dL (ref 0–99)
Potassium: 3.9 meq/L (ref 3.5–5.3)
Sodium: 141 meq/L (ref 135–145)
Total CHOL/HDL Ratio: 3.4
Triglycerides: 114 mg/dL (ref <150)
VLDL: 23 mg/dL (ref 0–40)

## 2010-08-02 LAB — BASIC METABOLIC PANEL
BUN: 14 mg/dL (ref 6–23)
Calcium: 9.4 mg/dL (ref 8.4–10.5)
Creat: 0.87 mg/dL (ref 0.40–1.20)
Glucose, Bld: 105 mg/dL — ABNORMAL HIGH (ref 70–99)
Potassium: 3.9 mEq/L (ref 3.5–5.3)

## 2010-08-02 LAB — LIPID PANEL
Cholesterol: 155 mg/dL (ref 0–200)
VLDL: 23 mg/dL (ref 0–40)

## 2010-08-02 NOTE — Telephone Encounter (Signed)
Please see phone note concerning pt's labs that she had done.

## 2010-08-03 ENCOUNTER — Telehealth: Payer: Self-pay | Admitting: Family Medicine

## 2010-08-03 NOTE — Telephone Encounter (Signed)
Spoke with pt and informed her that her lab results and ov has been faxed to [facility]. Pt would like to know if she should repeat labs because she drank a small amount of soda when she took her meds that morning and realized it after she had her blood drawn. Pt has an appt  Tomorrow to see Dr. Gerilyn Pilgrim at 930 and wanted to know should these labs be repeated? Please advise ASAP thank you.Karen Brennan [place]

## 2010-08-03 NOTE — Telephone Encounter (Signed)
This encounter was created in error - please disregard.

## 2010-08-03 NOTE — Telephone Encounter (Signed)
Was here on 2/20 and Dr Clotilde Dieter told her that we would send records of last visits and labs.  Wants to know if it has been sent.

## 2010-08-03 NOTE — Telephone Encounter (Signed)
Please fax the most recent labs collected on this patient on 07-01-10 to the [facility] at [phone]

## 2010-08-04 ENCOUNTER — Other Ambulatory Visit: Payer: Self-pay | Admitting: Family Medicine

## 2010-08-04 ENCOUNTER — Ambulatory Visit (INDEPENDENT_AMBULATORY_CARE_PROVIDER_SITE_OTHER): Payer: Medicaid Other | Admitting: Family Medicine

## 2010-08-04 DIAGNOSIS — E669 Obesity, unspecified: Secondary | ICD-10-CM

## 2010-08-04 NOTE — Progress Notes (Signed)
Medical Nutrition Therapy:  Appt start time: 0930 end time:  1030.  Assessment:  Primary concerns today: Weight management.  Karen Brennan was seen for MNT in January, in preparation for bariatric surgery, but is ineligible for that procedure at [facility], so fell off her efforts.  She is now seeing Dr. Delrae Alfred @ [facility] for bariatric surgery.  Pre-surgery  requirements for this procedure include documentation on the pt's AVS form, (among other things): ht, wt, and BMI; specified diet plan (w/ kcal Rx); exercise plan; and behavior mod interventions. Eating pattern has been erratic.  She has cut way back since Monday, seeing her wt over 500 lb.  24-hr recall suggests intake of <600 kcal: (up ~5 AM); B (10 AM)- 1 c applesauce, water; L (12 PM)- 8 oz Activia, sip of ginger ale; (napped from ~12:15 to 1:45 PM); D (6 PM)- 1 c applesauce, 12 oz orange juice; (to bed ~10 PM).  Karen Brennan has been depressed, and has been eating in response.  She is not keeping junk foods in the house, but has been overeating and eating frequently.  Usual physical activity includes none due to ankle and knee pain.    Progress Towards Goal(s):  No progress.   Nutritional Diagnosis:  NB-2.1 Physical inactivity As related to knee & ankle pain.  As evidenced by no exercise the past several weeks. . NI-1.5 Excessive energy intake As related to expenditure.  As evidenced by BMI of 65.    Intervention:  Nutrition Counseling.   Monitoring/Evaluation:  Dietary intake, weight, and physical activity in 1 month.

## 2010-08-04 NOTE — Patient Instructions (Addendum)
-   Eat at least 3 meals and 1-2 snacks per day.  Aim for no more than 5 hours between eating.  - Each meal should LOOK like, TASTE like, FEEL like a REAL meal.  This means lunch and dinner both have protein, starch, and veg's.  Breakfast will include protein, starch, and fruit.   - Recommended calorie level:  1200.  Follow food plan provided today.   - Example:  B- 1 cup High-fiber cereal (at least 5 grams of fiber per serving) with 1 cup milk & 1 piece; L- [place] sandwich on wheat with SMALL amount low-fat mayo, 4 oz yogurt, salad w/ low-fat [place] (5 g fat/svng); Snk- fruit; D- 4 oz chicken, 1/2 cup of potato, 1 1/2 cooked veg's, total of 1 tsp butter, 4 oz yogurt, 1 fruit.   - Physical activity:  Chair exercises with dumbells or soup cans:  Write down your routine, and follow it consistently.  Goal is at least 20 minutes per day (can be straight or split up) AND when sitting for long periods, GET UP at least every 15 minutes and move for at least a minute.   - Record # minutes of exercise per day and how often you did your 1-min exercising.  - Food record daily; bring to your follow-up nutrition appt.   - Today's height: 73", 494.8#, BMI 65.4.

## 2010-08-04 NOTE — Telephone Encounter (Signed)
She does not need to repeat it. Her blood sugar was a little better than the one we did 1 year ago and was about the same as it has been over the last few years(low 100's  Pre-diabetes range). Please let her know.

## 2010-08-09 ENCOUNTER — Encounter: Payer: Self-pay | Admitting: Family Medicine

## 2010-08-09 ENCOUNTER — Ambulatory Visit (INDEPENDENT_AMBULATORY_CARE_PROVIDER_SITE_OTHER): Payer: Medicaid Other | Admitting: Family Medicine

## 2010-08-09 DIAGNOSIS — E669 Obesity, unspecified: Secondary | ICD-10-CM

## 2010-08-09 NOTE — Patient Instructions (Signed)
Stay on the 1200 calorie diet but add lean protein.  Aim for a non-fat veggie and lean protein ever 4-6 hours for fat loss.  Try getting the book we discussed.  Make an appointment as directed by [facility].

## 2010-08-09 NOTE — Progress Notes (Signed)
  Subjective:    Patient ID: Karen Brennan, female    DOB: 02-Dec-1975, 35 y.o.   MRN: [id number]  HPI  Obesity: Pt comes in for an obesity appointment. She has been accepted with [facility] for the lap band procedure. She has to go to the class about it on March 7th. She has recently met with Dr. Gerilyn Pilgrim and they were able to outline a calorie and exercise plan for her. She has not started the exercise plan yet but has been restricting her calories. She is eating applesauce and yogurt and a cracker on some days and them other days she is eating normal meals. She says her eating is all based on how she feels and she has been depressed lately. She is still not walking well with her knee and wondered about the knee brace that was requested. It was sent in but the company has never received the paperwork so I will get them to send it again and I will fill it out again.    Review of Systems Neg except for weight loss since my last visit with her (but 3 lb gain since her last visit with Dr. Gerilyn Pilgrim), and some depression.     Objective:   Physical Exam  Constitutional:       Morbidly obese, no distress  Musculoskeletal:       Knee exam shows no grinding, no tenderness to palpation, no tenderness with internal and external rotation of hip, the rest of the exam is limited by body habitus.           Assessment & Plan:

## 2010-08-09 NOTE — Assessment & Plan Note (Signed)
Discussed labs. Pt remembered that she drank a soda the morning of her labs and yet her blood glucose was still less than 126. We will plan to repeat the labs in about 3 months.  Discussed lean protein and non-fat veggies (suggested frozen veggies) as a way to get enough carbs and protein to keep in a fat burning zone. Currently she is just eating a bunch of carbs.  Discussed exercise. She says her knee feels somewhat better and she thinks when she gets the brace she will be able to walk more. I will resend the paperwork after it is received again.  Pt to follow up PRN.

## 2010-08-16 ENCOUNTER — Other Ambulatory Visit (HOSPITAL_COMMUNITY)
Admission: RE | Admit: 2010-08-16 | Discharge: 2010-08-16 | Disposition: A | Discharge status: Home / Self Care | Payer: Medicaid Other | Source: Ambulatory Visit | Attending: Family Medicine | Admitting: Family Medicine

## 2010-08-16 ENCOUNTER — Encounter: Payer: Self-pay | Admitting: Family Medicine

## 2010-08-16 ENCOUNTER — Ambulatory Visit (INDEPENDENT_AMBULATORY_CARE_PROVIDER_SITE_OTHER): Payer: Medicaid Other | Admitting: Family Medicine

## 2010-08-16 VITALS — BP 122/80 | HR 76 | Temp 97.8°F | Ht 73.5 in | Wt >= 6400 oz

## 2010-08-16 DIAGNOSIS — L219 Seborrheic dermatitis, unspecified: Secondary | ICD-10-CM

## 2010-08-16 DIAGNOSIS — Z01419 Encounter for gynecological examination (general) (routine) without abnormal findings: Secondary | ICD-10-CM | POA: Insufficient documentation

## 2010-08-16 DIAGNOSIS — Z1159 Encounter for screening for other viral diseases: Secondary | ICD-10-CM | POA: Insufficient documentation

## 2010-08-16 LAB — POCT SKIN KOH: Skin KOH, POC: NEGATIVE

## 2010-08-16 MED ORDER — KETOCONAZOLE 2 % EX CREA: TOPICAL_CREAM | Freq: Two times a day (BID) | CUTANEOUS | Status: DC

## 2010-08-16 NOTE — Patient Instructions (Signed)
You have had recent labs in the last month.  The ketoconazole shampoo is the strongest shampoo to use against seborrheic dermatitis.  You can use the shampoo to wash your face as well to help with the seborrheic dermatitis of the nostrils.  I will call you with results from the skin scraping and Pap smear results. Make sure to wear very well supportive bras. I think this will decrease your breast pain.  They are normal today.

## 2010-08-16 NOTE — Progress Notes (Signed)
  Subjective:    Patient ID: Karen Brennan, female    DOB: 1975/09/06, 35 y.o.   MRN: [id number]  HPI Well Woman Exam: Pt is here today for a well woman exam. She is doing well other than some breast pain that she has had for the last 2 months. She does not have periods since she had the novosure procedure. She says the breast pain comes and goes but does not seem to correlate with anything that she can tell. She is working toward getting the lap band procedure done at [facility].    Review of Systems  Constitutional: Negative for activity change, appetite change and unexpected weight change.  HENT: Negative.   Eyes: Negative.   Respiratory: Negative.   Cardiovascular: Negative.   Gastrointestinal: Negative.   Genitourinary: Negative.   Musculoskeletal: Positive for joint swelling.       Knee pain and ankle pain but both improving.   Neurological: Negative.   Hematological: Negative.   Psychiatric/Behavioral: Positive for dysphoric mood.       Objective:   Physical Exam  Constitutional: She is oriented to person, place, and time. She appears well-developed and well-nourished.  HENT:  Head: Normocephalic and atraumatic.  Right Ear: External ear normal.  Left Ear: External ear normal.  Nose: Nose normal.  Mouth/Throat: Oropharynx is clear and moist. No oropharyngeal exudate.  Eyes: Conjunctivae and EOM are normal. Pupils are equal, round, and reactive to light. Right eye exhibits no discharge. Left eye exhibits no discharge. No scleral icterus.  Neck: Normal range of motion. Neck supple.  Cardiovascular: Normal rate, regular rhythm, normal heart sounds and intact distal pulses.  Exam reveals no gallop and no friction rub.   No murmur heard. Pulmonary/Chest: Effort normal and breath sounds normal. Stridor present. No respiratory distress. She has no wheezes. She has no rales. She exhibits no tenderness.  Abdominal: Soft. Bowel sounds are normal. She exhibits no distension and  no mass. There is no tenderness. There is no rebound.  Genitourinary: Vagina normal. No vaginal discharge found.       Uterus not examined due to body habitus, no cervical lesions  Musculoskeletal: Normal range of motion. She exhibits no edema.  Lymphadenopathy:    She has no cervical adenopathy.  Neurological: She is alert and oriented to person, place, and time.  Skin: Skin is warm and dry. No rash noted. No erythema. No pallor.  Psychiatric: Her behavior is normal. Judgment and thought content normal.       Pt has depressed mood          Assessment & Plan:

## 2010-08-17 LAB — RPR

## 2010-08-18 ENCOUNTER — Telehealth: Payer: Self-pay | Admitting: Family Medicine

## 2010-08-18 NOTE — Telephone Encounter (Signed)
Pt checking status of rx from 3/6, pharmacy never received it. Also asking for test results.

## 2010-08-19 ENCOUNTER — Ambulatory Visit (INDEPENDENT_AMBULATORY_CARE_PROVIDER_SITE_OTHER): Payer: Medicaid Other | Admitting: Family Medicine

## 2010-08-19 VITALS — BP 120/88 | HR 100 | Temp 98.2°F | Ht 72.75 in | Wt >= 6400 oz

## 2010-08-19 DIAGNOSIS — R109 Unspecified abdominal pain: Secondary | ICD-10-CM | POA: Insufficient documentation

## 2010-08-19 LAB — POCT URINALYSIS DIPSTICK
Blood, UA: NEGATIVE
Leukocytes, UA: NEGATIVE
Protein, UA: NEGATIVE
Urobilinogen, UA: 0.2
pH, UA: 5.5

## 2010-08-19 MED ORDER — ACETAMINOPHEN-CODEINE 300-60 MG PO TABS: 1.0000 | ORAL_TABLET | ORAL | Status: DC | PRN

## 2010-08-19 MED ORDER — CEPHALEXIN 500 MG PO CAPS: 500.0000 mg | ORAL_CAPSULE | Freq: Two times a day (BID) | ORAL | Status: AC

## 2010-08-19 NOTE — Progress Notes (Signed)
  Subjective:    Patient ID: Karen Brennan, female    DOB: 1975/07/25, 35 y.o.   MRN: [id number]  HPI Pt presents with one day history of L sided flank and back pain.  Since yesterday pain has eased some.  Has had some associated nausea without vomiting with this pain.  Denies dysuria but endorses increased frequency and urgency along with tingling feeling in her genital region after she urinates.  Urine has been darker than usual, but denies gross blood or pink urine.   Denies fever or chills.  Denies vaginal discharge. States this feels like the last UTI she had.     Review of Systems See HPI     Objective:   Physical Exam  Constitutional:       Obese, NAD   HENT:  Head: Normocephalic and atraumatic.  Cardiovascular: Normal rate and regular rhythm.   Pulmonary/Chest: Effort normal and breath sounds normal.  Abdominal: Soft. Bowel sounds are normal. There is tenderness.       Suprapubic tenderness, no CVA tenderness or other abdominal pain  Musculoskeletal: She exhibits no edema.

## 2010-08-19 NOTE — Assessment & Plan Note (Signed)
Pt has lost a little weight, she is still planning to get the lap band procedure at [facility]. She is working with our nutritionist to lose weight and have a plan for the weight loss. Her right knee is feeling better despite not getting the brace yet. Pap done today. Will await results.

## 2010-08-19 NOTE — Patient Instructions (Signed)
It was nice seeing you today.  Try to drink plenty of water, at least 64 oz (8cups) per day.  If you develop fever or increased give [place] a call to come back and see [place].  For pain you may use your tylenol with codeine that is already prescribed to you.

## 2010-08-22 NOTE — Assessment & Plan Note (Addendum)
Urine dip obtained which was negative for LE, blood or nitrite.  History very convincing for UTI however.  Will send urine for culture and go ahead and treat empirically with keflex and have her return for worsening or non-resolving symptoms.

## 2010-08-24 LAB — COMPREHENSIVE METABOLIC PANEL
Albumin: 3.5 g/dL (ref 3.5–5.2)
BUN: 7 mg/dL (ref 6–23)
Calcium: 8.8 mg/dL (ref 8.4–10.5)
Creatinine, Ser: 0.9 mg/dL (ref 0.4–1.2)
Total Bilirubin: 0.8 mg/dL (ref 0.3–1.2)
Total Protein: 7.2 g/dL (ref 6.0–8.3)

## 2010-08-24 LAB — URINE CULTURE
Colony Count: >=100000
Culture  Setup Time: 201110011106
Culture  Setup Time: 201110032125

## 2010-08-24 LAB — GC/CHLAMYDIA PROBE AMP, GENITAL: GC Probe Amp, Genital: NEGATIVE

## 2010-08-24 LAB — URINALYSIS, ROUTINE W REFLEX MICROSCOPIC
Glucose, UA: NEGATIVE mg/dL
Hgb urine dipstick: NEGATIVE
Protein, ur: NEGATIVE mg/dL
Specific Gravity, Urine: 1.025 (ref 1.005–1.030)
Specific Gravity, Urine: <1.005 — ABNORMAL LOW (ref 1.005–1.030)
Urobilinogen, UA: 1 mg/dL (ref 0.0–1.0)
pH: 6.5 (ref 5.0–8.0)

## 2010-08-24 LAB — CBC
MCH: 28.4 pg (ref 26.0–34.0)
MCV: 87.9 fL (ref 78.0–100.0)
Platelets: 312 10*3/uL (ref 150–400)
RDW: 13.2 % (ref 11.5–15.5)

## 2010-08-24 LAB — POCT URINALYSIS DIPSTICK
Hgb urine dipstick: NEGATIVE
Nitrite: POSITIVE — AB
Protein, ur: 30 mg/dL — AB
Specific Gravity, Urine: 1.01 (ref 1.005–1.030)
Urobilinogen, UA: 2 mg/dL — ABNORMAL HIGH (ref 0.0–1.0)

## 2010-08-24 LAB — POCT PREGNANCY, URINE: Preg Test, Ur: NEGATIVE

## 2010-08-24 LAB — URINE MICROSCOPIC-ADD ON

## 2010-08-24 LAB — POCT I-STAT, CHEM 8
Calcium, Ion: 1.09 mmol/L — ABNORMAL LOW (ref 1.12–1.32)
HCT: 43 % (ref 36.0–46.0)
Hemoglobin: 14.6 g/dL (ref 12.0–15.0)
Sodium: 141 mEq/L (ref 135–145)
TCO2: 30 mmol/L (ref 0–100)

## 2010-08-24 LAB — WET PREP, GENITAL
Clue Cells Wet Prep HPF POC: NONE SEEN
Trich, Wet Prep: NONE SEEN
Yeast Wet Prep HPF POC: NONE SEEN

## 2010-08-29 NOTE — Telephone Encounter (Signed)
Will route to [facility] team to make sure this encounter can be closed.

## 2010-08-29 NOTE — Telephone Encounter (Signed)
Amber - please close this phone encounter.  Thanks!

## 2010-09-01 ENCOUNTER — Other Ambulatory Visit: Payer: Self-pay | Admitting: Family Medicine

## 2010-09-01 NOTE — Telephone Encounter (Signed)
Refill request

## 2010-09-07 ENCOUNTER — Ambulatory Visit (INDEPENDENT_AMBULATORY_CARE_PROVIDER_SITE_OTHER): Payer: Medicaid Other | Admitting: Family Medicine

## 2010-09-07 ENCOUNTER — Encounter: Payer: Self-pay | Admitting: Family Medicine

## 2010-09-07 VITALS — BP 125/80 | HR 124 | Temp 97.0°F | Ht 73.0 in | Wt >= 6400 oz

## 2010-09-07 DIAGNOSIS — IMO0001 Reserved for inherently not codable concepts without codable children: Secondary | ICD-10-CM | POA: Insufficient documentation

## 2010-09-07 DIAGNOSIS — Z713 Dietary counseling and surveillance: Secondary | ICD-10-CM

## 2010-09-07 NOTE — Assessment & Plan Note (Addendum)
Weight loss of 13 lbs from highest weight. Pt is doing hand weights, walking, is planning to start doing the hip hop video.  Pt has an appointment with her surgeon (Dr. Lily Peer) on May 3rd, and then has psychological testing and then nutritional counseling and then will get a surgery date. Pt continues to lose weight, mood is good. Being done at [facility]. Pt has been told to stay off NSAIDs until her surgery. Will continue seeing hte patient if Dr. Lily Peer thinks she needs to or wants her to. Will be happy to continue seeing her. I'm excited for her new oppotrunity.

## 2010-09-07 NOTE — Patient Instructions (Signed)
Ask the surgeon if he needs you to keep seeing me monthly and wants reports, or not? Keep up the good work. Try to find out about the brace so you can walk with stability.  Call back to make an appointment for next month if you need one.  Congrats. You are 13 lbs down for your highest weight here!

## 2010-09-11 NOTE — Progress Notes (Signed)
  Subjective:    Patient ID: Karen Brennan, female    DOB: May 31, 1976, 35 y.o.   MRN: [id number]  HPI Pt is doing well. She is happy to be losing some weight. She is looking forward to the next phase of weight loss. She has a Pre-op visit with Dr. Arnetha Courser (surgeon) and then will start evaluations with the psychologist and nutritionist. She is exercising 1 day a week for about 30 min. She is getting her heart rate up and sweating. She lifts the 5 lbs weights daily. She is planning to walk more but still has not gotten the knee brace (they ordered the wrong one).    Review of Systems Neg except as noted in HPI.     Objective:   Physical Exam GEN: mobidly obese, NAD, sitting comfortably on table.  HEENT: [place]/AT, EOMI, no deformities Ext: braces on both ankles for stability.        Assessment & Plan:

## 2010-09-17 LAB — URINALYSIS, ROUTINE W REFLEX MICROSCOPIC
Glucose, UA: NEGATIVE mg/dL
Leukocytes, UA: NEGATIVE
pH: 5.5 (ref 5.0–8.0)

## 2010-09-17 LAB — GC/CHLAMYDIA PROBE AMP, GENITAL
Chlamydia, DNA Probe: NEGATIVE
GC Probe Amp, Genital: NEGATIVE

## 2010-09-17 LAB — CBC
HCT: 34.1 % — ABNORMAL LOW (ref 36.0–46.0)
Hemoglobin: 11.2 g/dL — ABNORMAL LOW (ref 12.0–15.0)
MCHC: 32.8 g/dL (ref 30.0–36.0)
RDW: 15 % (ref 11.5–15.5)

## 2010-09-17 LAB — URINE MICROSCOPIC-ADD ON

## 2010-09-17 LAB — COMPREHENSIVE METABOLIC PANEL
BUN: 10 mg/dL (ref 6–23)
Calcium: 9 mg/dL (ref 8.4–10.5)
Creatinine, Ser: 0.8 mg/dL (ref 0.4–1.2)
Glucose, Bld: 113 mg/dL — ABNORMAL HIGH (ref 70–99)
Total Protein: 6.7 g/dL (ref 6.0–8.3)

## 2010-09-17 LAB — WET PREP, GENITAL: Trich, Wet Prep: NONE SEEN

## 2010-09-17 LAB — URINE CULTURE

## 2010-09-22 LAB — CBC
HCT: 31.7 % — ABNORMAL LOW (ref 36.0–46.0)
Platelets: 413 10*3/uL — ABNORMAL HIGH (ref 150–400)
RBC: 4.3 MIL/uL (ref 3.87–5.11)
WBC: 8.7 10*3/uL (ref 4.0–10.5)

## 2010-09-27 LAB — CROSSMATCH

## 2010-09-27 LAB — COMPREHENSIVE METABOLIC PANEL
Alkaline Phosphatase: 55 U/L (ref 39–117)
BUN: 11 mg/dL (ref 6–23)
Creatinine, Ser: 0.9 mg/dL (ref 0.4–1.2)
Glucose, Bld: 82 mg/dL (ref 70–99)
Potassium: 3.9 mEq/L (ref 3.5–5.1)
Total Bilirubin: 0.3 mg/dL (ref 0.3–1.2)
Total Protein: 7.3 g/dL (ref 6.0–8.3)

## 2010-09-27 LAB — APTT: aPTT: 31 seconds (ref 24–37)

## 2010-09-27 LAB — CBC
HCT: 25.9 % — ABNORMAL LOW (ref 36.0–46.0)
HCT: 27.7 % — ABNORMAL LOW (ref 36.0–46.0)
HCT: 30.7 % — ABNORMAL LOW (ref 36.0–46.0)
Hemoglobin: 8 g/dL — ABNORMAL LOW (ref 12.0–15.0)
Hemoglobin: 8.9 g/dL — ABNORMAL LOW (ref 12.0–15.0)
Hemoglobin: 9.9 g/dL — ABNORMAL LOW (ref 12.0–15.0)
MCHC: 32.2 g/dL (ref 30.0–36.0)
MCHC: 32.2 g/dL (ref 30.0–36.0)
MCV: 68.1 fL — ABNORMAL LOW (ref 78.0–100.0)
MCV: 70.1 fL — ABNORMAL LOW (ref 78.0–100.0)
MCV: 73.4 fL — ABNORMAL LOW (ref 78.0–100.0)
Platelets: 418 10*3/uL — ABNORMAL HIGH (ref 150–400)
Platelets: 426 10*3/uL — ABNORMAL HIGH (ref 150–400)
RBC: 3.95 MIL/uL (ref 3.87–5.11)
RBC: 4.18 MIL/uL (ref 3.87–5.11)
RDW: 17 % — ABNORMAL HIGH (ref 11.5–15.5)
RDW: 18.5 % — ABNORMAL HIGH (ref 11.5–15.5)
RDW: 20.6 % — ABNORMAL HIGH (ref 11.5–15.5)
WBC: 11.5 10*3/uL — ABNORMAL HIGH (ref 4.0–10.5)
WBC: 9.4 10*3/uL (ref 4.0–10.5)
WBC: 9.8 10*3/uL (ref 4.0–10.5)

## 2010-09-27 LAB — DIFFERENTIAL
Eosinophils Absolute: 0.2 10*3/uL (ref 0.0–0.7)
Eosinophils Relative: 2 % (ref 0–5)
Lymphocytes Relative: 35 % (ref 12–46)
Lymphs Abs: 3.3 10*3/uL (ref 0.7–4.0)
Monocytes Absolute: 0.6 10*3/uL (ref 0.1–1.0)

## 2010-09-27 LAB — TSH: TSH: 1.116 u[IU]/mL (ref 0.350–4.500)

## 2010-09-27 LAB — PROTIME-INR: Prothrombin Time: 12.7 seconds (ref 11.6–15.2)

## 2010-09-27 LAB — BASIC METABOLIC PANEL
BUN: 11 mg/dL (ref 6–23)
Chloride: 102 mEq/L (ref 96–112)
Glucose, Bld: 87 mg/dL (ref 70–99)
Potassium: 3.5 mEq/L (ref 3.5–5.1)

## 2010-09-27 LAB — PREPARE RBC (CROSSMATCH)

## 2010-09-27 LAB — FERRITIN: Ferritin: 5 ng/mL — ABNORMAL LOW (ref 10–291)

## 2010-10-21 ENCOUNTER — Encounter: Payer: Self-pay | Admitting: Family Medicine

## 2010-10-21 ENCOUNTER — Ambulatory Visit (INDEPENDENT_AMBULATORY_CARE_PROVIDER_SITE_OTHER): Payer: Medicaid Other | Admitting: Family Medicine

## 2010-10-21 VITALS — BP 135/90 | HR 78 | Temp 97.9°F | Ht 73.0 in | Wt >= 6400 oz

## 2010-10-21 DIAGNOSIS — F339 Major depressive disorder, recurrent, unspecified: Secondary | ICD-10-CM

## 2010-10-21 DIAGNOSIS — J309 Allergic rhinitis, unspecified: Secondary | ICD-10-CM

## 2010-10-21 DIAGNOSIS — E669 Obesity, unspecified: Secondary | ICD-10-CM

## 2010-10-21 DIAGNOSIS — G4700 Insomnia, unspecified: Secondary | ICD-10-CM | POA: Insufficient documentation

## 2010-10-21 DIAGNOSIS — M25579 Pain in unspecified ankle and joints of unspecified foot: Secondary | ICD-10-CM

## 2010-10-21 MED ORDER — QUETIAPINE FUMARATE 25 MG PO TABS: 50.0000 mg | ORAL_TABLET | Freq: Every day | ORAL | Status: DC

## 2010-10-21 MED ORDER — CITALOPRAM HYDROBROMIDE 20 MG PO TABS: 20.0000 mg | ORAL_TABLET | Freq: Every day | ORAL | Status: DC

## 2010-10-21 MED ORDER — CLONAZEPAM 2 MG PO TABS: 2.0000 mg | ORAL_TABLET | Freq: Three times a day (TID) | ORAL | Status: DC | PRN

## 2010-10-21 MED ORDER — HYDROCHLOROTHIAZIDE 25 MG PO TABS: 25.0000 mg | ORAL_TABLET | ORAL | Status: DC

## 2010-10-21 MED ORDER — ACETAMINOPHEN-CODEINE 300-60 MG PO TABS: 1.0000 | ORAL_TABLET | ORAL | Status: DC | PRN

## 2010-10-21 MED ORDER — CETIRIZINE HCL 10 MG PO CAPS: 10.0000 mg | ORAL_CAPSULE | Freq: Every day | ORAL | Status: DC

## 2010-10-21 NOTE — Assessment & Plan Note (Signed)
Pt has lost about 20 lbs since her personal high of over 500 lbs. She is working to get off a total of 45 lbs so that she will qualify for the lap band procedure. [place] is [place] [employer] and talking to Academic librarian and is very happy with her weight loss.

## 2010-10-21 NOTE — Assessment & Plan Note (Signed)
Pt takes seroquel for her mood and for help with insomnia. She was increased to 2 tabs a night and has been running out early. Rx sent in today to refect her new dosing.

## 2010-10-21 NOTE — Assessment & Plan Note (Signed)
Pt still having pain with ankle, wears her brace during exercising at the [facility] but still has some pain.  Uses Tylenol #4 for the pain and Motrin. Refilled Tylenol #4.

## 2010-10-21 NOTE — Assessment & Plan Note (Signed)
celexa refilled

## 2010-10-21 NOTE — Progress Notes (Signed)
Obesity/Weight loss: Pt is doing well. She has lost another 5 lbs since I saw her on 09-07-10. She is trying to lose a total of 45 lbs and has already lost 10. She will then qualify for the lap band procedure. She is working out at [employer] and has met with a Systems analyst. She does not want to do gastic bypass so she is working hard so that she can get lap band. She is taking vitamins.   Insomnia: PT still struggles with insomnia but says it is better since she started taking Seroquel at night.   Pain: Foot and low back pain continue. She takes Tylenol #4 for them when needed and motrin on a more consistant basis. She says she wears her brace when she exercises.   Depression: Pt is doing well on the Celexa. Needs refill. No side effects noted. Mood brighter since she has been losing weight and exercising.   Allergies: Pt has been taking cetirizine for her allergies and needs a refill.   ROs: loss of weight is intentional, feeling better about herself.   PE:  Gen: happy, smaller than last time but still morbidly obese. CV: RRR, no murmur Pulm: CTAB, no wheezing Psych: pt has a brighter affect than normal. Normal eye contact.

## 2010-10-21 NOTE — Assessment & Plan Note (Signed)
Zyrtec refilled today.  

## 2010-10-21 NOTE — Patient Instructions (Signed)
We refilled several meds today.  I'm so proud of you for the efforts you are making to be healthy.  Keep up the good work.

## 2010-10-24 ENCOUNTER — Encounter: Payer: Self-pay | Admitting: Family Medicine

## 2010-10-25 NOTE — Group Therapy Note (Signed)
NAME:  Karen Brennan, Karen Brennan NO.:  [id number]   MEDICAL RECORD NO.:  [id number]          PATIENT TYPE:  WOC   LOCATION:  [facility]                   FACILITY:  [facility]   PHYSICIAN:  Argentina Donovan, MD        DATE OF BIRTH:  1976-01-03   DATE OF SERVICE:                                  CLINIC NOTE   The patient is a 35 year old gravida 4, para 4-0-0-4 who had a tubal  ligation postpartum 3 years ago, weighs 445 pounds and is 6 feet 1 inch  tall.  Was admitted recently to the hospital because of severe  dysfunctional uterine bleeding in which she had a 4-unit blood  transfusion that brought her up to a hemoglobin of about 11.  She was  sent home on  some Provera.  She stopped the Provera and the bleeding  restarted.  She went into the emergency room and was given Sprintec,  which she started a few days ago, and she was referred in here for  followup.  The patient notably 1 year ago had an ultrasound which  revealed a thickened endometrium which seems to have never been followed  up, so we plan on doing an endometrial biopsy today.  At the present  time, she is not bleeding that heavily.  She said she had a hemoglobin  of 10 two days ago, when she went in bleeding to the emergency room.  So  decided to do an endometrial biopsy followed by cyclic monthly Provera  10 mg a day for the first 10 days of each month.  If this does not hold  her, we may consider endometrial ablation.  The endometrial biopsy was  done by placing a Graves speculum into the vagina.  The cervix was  placed in the center of the viewing area and grasped with single-tooth  tenaculum.  The  uterine cavity was sounded to a depth of 9 cm.  The  endometrial biopsy was then accomplished without incident.  Tenaculum  removed from the vagina.  The patient will get a CBC before discharge,  return in 2 weeks for results.  We gave her prescription for her Provera  10 mg a day for 10 days each month and encouraged to  continue with her  iron.   IMPRESSION:  1. Dysfunctional uterine bleeding.  2. Morbid obesity.           ______________________________  Argentina Donovan, MD     PR/MEDQ  D:  09/02/2008  T:  09/02/2008  Job:  [id number]

## 2010-10-25 NOTE — Op Note (Signed)
NAME:  Karen Brennan, Karen Brennan                ACCOUNT NO.:  [id number]   MEDICAL RECORD NO.:  [id number]          PATIENT TYPE:  AMB   LOCATION:  [facility]                           [facility]   PHYSICIAN:  Allie Bossier, MD        DATE OF BIRTH:  1975-09-05   DATE OF PROCEDURE:  01/28/2009  DATE OF DISCHARGE:                               OPERATIVE REPORT   PREOPERATIVE DIAGNOSES:  Dysfunctional uterine bleeding, anemia, morbid  obesity.   POSTOPERATIVE DIAGNOSES:  Dysfunctional uterine bleeding, anemia, morbid  obesity.   PROCEDURE:  ThermaChoice endometrial ablation.   SURGEON:  Myra C. Marice Potter, MD   ANESTHESIA:  LMA, Belva Agee, MD   COMPLICATIONS:  None.   ESTIMATED BLOOD LOSS:  Minimal.   SPECIMENS:  None.   FINDINGS:  Uterus anteverted and sounding to 12 cm.   DETAILS OF PROCEDURE AND FINDINGS:  The risks, benefits, alternatives,  and satisfaction rate were discussed with the patient and consents were  signed.  All questions were answered.  A pregnancy test was not done and  she has had a tubal ligation.  She was taken to the operating room and  placed in dorsal lithotomy position.  General anesthesia was applied  without complication.  Her vagina was prepped and draped in the usual  sterile fashion.  Her bladder was emptied with a Robinson catheter.  Bimanual exam revealed a normal size and shape anteverted uterus and  nonenlarged adnexa.  A speculum was placed.  Her bladder had been  drained with a Robinson catheter.  A speculum was placed and a single-  tooth tenaculum placed on the anterior lip of cervix.  The cervix was  first sounded and found it 12 cm, it was then gently  dilated with Hegar  dilators to accommodate the ThermaChoice instrument.  The ThermaChoice  wand and balloon were prepped in the usual fashion and a consistent  pressure of 174 mmHg was obtained.  The instrument was introduced.  The  balloon was inflated until a consistent pressure of 167 was obtained  and  the cycle was started, yet he did to its preset 87 degrees and ran for  its preset 8 minutes.  After the cool-down time, the instrument was  removed.  She was extubated and taken to recovery room in stable  condition.  The instruments, sponge, and needs counts were correct.  She  tolerated the procedure well.      Allie Bossier, MD  Electronically Signed    MCD/MEDQ  D:  01/28/2009  T:  01/28/2009  Job:  [id number]
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
## 2010-10-25 NOTE — Discharge Summary (Signed)
NAME:  Karen Brennan, Karen Brennan NO.:  [id number]   MEDICAL RECORD NO.:  [id number]          PATIENT TYPE:  INP   LOCATION:  4731                         FACILITY:  [facility]   PHYSICIAN:  Paula Compton, MD        DATE OF BIRTH:  Mar 15, 1976   DATE OF ADMISSION:  08/08/2008  DATE OF DISCHARGE:  08/10/2008                               DISCHARGE SUMMARY   PRIMARY CARE Seabron Iannello:  Johney Maine, MD, [facility].   DISCHARGE DIAGNOSES:  1. Dysfunctional uterine bleeding/menorrhagia.  2. Morbid obesity.  3. Depression.  4. Hypertension.  5. Gastroesophageal reflux disease.   DISCHARGE MEDICATIONS:  1. Provera 10 mg p.o. twice daily x7 days.  2. Hydrochlorothiazide 25 mg p.o. daily.  3. Inderal 80 mg p.o. daily.  4. Celexa 20 mg p.o. daily.  5. Bupropion 522 mg p.o. daily.  6. Seroquel 25 mg p.o. daily.  7. Klonopin 1 mg p.o. b.i.d.  8. Ferrous sulfate 325 mg p.o. b.i.d.  9. Colace 100 mg p.o. b.i.d.  10.Prilosec 20 mg p.o. daily.   DISCONTINUED MEDICATIONS:  Zantac 150 mg p.o. daily.   CONSULTS:  None.   PROCEDURES:  None.   ADMISSION LABORATORY DATA:  Admission hemoglobin 8.0, hematocrit 25.9,  and platelets 524.  CMET; potassium 3.9, BUN 11, creatinine 0.90.   DISCHARGE LABORATORY DATA:  TSH 1.116 within normal limits.  Ferritin 5.  Hemoglobin 10.0, hematocrit 31.7, and platelets 413.  BMET; potassium  3.5, BUN 11, and creatinine 1.02.   BRIEF HOSPITAL COURSE:  A 35 year old female with morbid obesity,  history of menorrhagia, and known dysfunctional uterine bleeding  admitted with symptomatic anemia.   1.Dysfunctional uterine bleeding:  The patient with history of  menorrhagia with known hypertrophied endometrial stripe of 19 mm per  ultrasound on June 2009.  The patient was, however, lost to follow up.  Upon admission, the patient was found to be anemic with hemoglobin of  8.0 with prior hemoglobin of 7.8 at primary care Zykiria Bruening's office  the  day prior to admission.  The patient was transfused 4 units over  hospital course with suboptimal response.  However, of note, the  patient's baseline hemoglobin was 10.9 prior to admission.  The  patient's hemoglobin status post 4 units was 10.0 and hematocrit 31.7  and although this was not adequate response to 4 units, hemoglobin  remained stable status post transfusion.  The patient was started on  iron supplementation with symptomatic anemia and dysfunctional uterine  bleeding.  The patient was also started on a 10-day course of Provera  p.o. to aid in bleeding.  Repeat ultrasound was not done during  admission as the patient with history of hypertrophied endometrial  stripe on previous ultrasound and will need further evaluation by  Gynecology which may include sonohysterogram and endometrial biopsy.  The patient was discharged home with proper follow up with Gynecology in  3 weeks for further evaluation of dysfunctional uterine bleeding.  The  patient is to follow up with primary care Nikolos Billig in approximately 2  weeks for repeat CBC to  evaluate hemoglobin status.   2.Morbid obesity:  Unchanged, will defer to PCP regarding weight loss.   3.Depression:  The patient's depression remained stable throughout  admission, was continued on all home medications.   4.Hypertension:  The patient's blood pressure remained in stable,  although slightly hypotensive.  The patient's systolic ranged from upper  90s to 130 over 50-60 diastolic.  Blood pressure medications were not  held as the patient's BP remained stable and clinically the patient  improved regarding symptomatic anemia symptoms.  The patient will follow  up with PCP regarding hypertension as scheduled.   5.GERD:  The patient with history of GERD, was previously on Zantac  which was substituted with Pepcid per hospital formulary.  The patient  had increasing GERD symptoms with use of Provera.  She was given one  dose of  Protonix prior to discharge which showed improvement in her  symptoms.  Therefore, the patient Zantac was discontinued and the  patient was started on Prilosec 20 mg p.o. daily.  Anticipate reduction  in GERD symptoms after completing course of Provera.   FOLLOWUP APPOINTMENTS:  1. Dr. Johney Maine, [facility], March 16, at      9:15 a.m.  2. [facility], March 24, at 1:30 p.m.   ISSUES FOR FOLLOWUP:  Repeat CBC to evaluate hemoglobin, GYN evaluation  for dysfunctional uterine bleeding, hypertension to assess need or  change in antihypertensive medications.   DISCHARGE LOCATION:  Home.   DISCHARGE CONDITION:  Stable.      Milinda Antis, MD  Electronically Signed      Paula Compton, MD  Electronically Signed    KD/MEDQ  D:  08/10/2008  T:  08/11/2008  Job:  [id number]   cc:   Johney Maine, M.D.  Shelbie Proctor. Shawnie Pons, M.D.

## 2010-10-25 NOTE — Group Therapy Note (Signed)
NAME:  Karen Brennan, Karen Brennan NO.:  [id number]   MEDICAL RECORD NO.:  [id number]          PATIENT TYPE:  WOC   LOCATION:  [facility]                   FACILITY:  [facility]   PHYSICIAN:  Allie Bossier, MD        DATE OF BIRTH:  1976/04/09   DATE OF SERVICE:  12/16/2008                                  CLINIC NOTE   Ms. Kirks is a 35 year old single black gravida 5, para 4, abortus one  who has had a long history of menorrhagia.  This has resulted in anemia  severe enough in March of this year to require admission to the hospital  and transfusion of packed red blood cells.  More recently her hemoglobin  was done Oct 20, 2008 and was noted to be 10.8.  Please note that  cervical cultures have been done and were negative.   PAST MEDICAL HISTORY:  As above plus:  1. Morbid obesity.  2. Hypertension.  3. She is bipolar.  4. Certainly anemic.   REVIEW OF SYSTEMS:  She said that at times she can bleed up to 26 days a  month.  She has tried oral contraceptive pills and they have not helped.  She has tried Provera and that was no help either.  Her Pap smear was  normal in March of this year.  Endometrial biopsy was normal March of  this year and her ultrasound was completely normal.  Double uterine  thickness was measured at 4 mm.   PAST SURGICAL HISTORY:  1. D and C.  2. Left ankle surgery.  3. Tubal ligation.   No latex allergies.   MEDICINE ALLERGIES:  MERIDIA.   PHYSICAL EXAM:  Height 6 feet 1 inch, weight 453 pounds, blood pressure  131/76, pulse 85.  HEENT:  Normal.  HEART:  Regular rate, rhythm.  LUNGS:  Clear to auscultation bilaterally.  BREAST EXAM:  Normal.  ABDOMEN:  Obese, benign.  EXTERNAL GENITALIA:  Normal.  Uterus normal size and shape, anteverted.  Adnexa nonpalpable.   ASSESSMENT/PLAN:  Menorrhagia resulting in significant anemia.  We will  proceed with an endometrial ablation (ThermaChoice).  I have explained  that 9/10 women are satisfied with this  procedure and I have answered  all of her questions about surgery.      Allie Bossier, MD     MCD/MEDQ  D:  12/16/2008  T:  12/16/2008  Job:  [id number]

## 2010-10-28 NOTE — Discharge Summary (Signed)
NAME:  Karen Brennan, Karen Brennan                          ACCOUNT NO.:  [id number]   MEDICAL RECORD NO.:  [id number]                   PATIENT TYPE:  INP   LOCATION:  9154                                 [facility]ILITY:  [facility]   PHYSICIAN:  Bradly Bienenstock, M.D.                   DATE OF BIRTH:  1975/10/15   DATE OF ADMISSION:  05/13/2002  DATE OF DISCHARGE:  05/17/2002                                 DISCHARGE SUMMARY   DISCHARGE DIAGNOSES:  1. Intrauterine pregnancy at [redacted] weeks gestation.  2. Dehydration.  3. Viral respiratory infection.   DISCHARGE MEDICATIONS:  1. Azithromycin 250 mg one tab by mouth each day times three days.  2. Prenatal vitamins.   FOLLOW UP:  The patient is to follow-up with the [facility] with  Dr. Kevin Fenton on Friday, May 24, 2002.   DIAGNOSTIC IMPRESSION:  No studies while in hospital.   HISTORY OF PRESENT ILLNESS:  This is a 35 year old Gravida IV, Para II,  0/1/2 who presented at 36 weeks and one day gestation by last menstrual  period plus nine week ultrasound, who presented with a five day history of  sore throat, cough, and vomiting. Occasional contractions. No vaginal  bleeding. Membranes intact. The patient states that she had had fever and  chills, sore throat and hoarseness and substernal dull chest pain with  vomiting and decreased by mouth intact over the last five days. This was bad  enough to present to Maternity admissions.   PREGNANCY COMPLICATIONS:  The patient had had morbid obesity and hyperemesis  early in the pregnancy. She has one child with Down's Syndrome. Has a  history of postpartum depression and asymptomatic bactiurea at the beginning  of her pregnancy.   MEDICATIONS:  The patient was taking one Zoloft, Protonix, and prenatal  vitamins.   ALLERGIES:  MERIDIA (gave patient hives).   OBSTETRICAL HISTORY:  In April of 1998 she had a spontaneous vaginal  delivery of a term female with Down's syndrome. Meconium  aspiration at  birth. In October of 1999, the patient had a spontaneous vaginal delivery of  a term female. In August of 1996, had a SAB at 12 weeks.   GYNECOLOGIC HISTORY:  The patient has history of Chlamydia treated in 1999.   PAST MEDICAL HISTORY:  Morbid obesity and childhood asthma.   PAST SURGICAL HISTORY:  The patient had ankle surgery in 1995.   SOCIAL HISTORY:  No tobacco, alcohol, or drugs.   LABORATORY DATA:  The patient has O+ blood type. Negative antibody screens.  Hemoglobin 13. Platelets 368,000. Hepatitis B surface antigen is negative.  Syphilis negative. HIV negative. GC and Chlamydia negative. UA revealed  specific gravity was greater than 1.030. Moderate bilirubin. Greater than 80  ketones. Total protein 100. Greater than 8 urobilinogen. Nitrate was  positive.   PHYSICAL EXAMINATION:  VITAL SIGNS: The patient was afebrile at 97.3.  Pulse  93, respiratory rate 20, blood pressure 130/76. O2 sat on room air is 98%.  Fetal heart tones baseline 150 to 160 with good variability, reactive.  GENERAL: This is an ill appearing female with raspy speech.  HEART: Regular rate and rhythm. No murmur, rub, or gallop.  LUNGS: Clear to auscultation and percussion. No auscultatory accessory  muscle use.  ABDOMEN: Gravid and nontender. No cardiovascular tenderness.   ASSESSMENT:  This was an intrauterine pregnancy at 36 weeks and one day in  patient with upper respiratory infection with severe dehydration. The  patient was admitted for therapy of her dehydration.   HOSPITAL COURSE:  The patient was admitted and begun on Zithromax 500 mg  each day and Tussionex. The patient was begun on IV fluid repletion. The  patient symptomatically improved over the course of her hospitalization and  ended up having a 12 liter fluid deficit, which was repleated over the  course of her hospitalization. By the day of discharge, the patient was  feeling better, although she was continuing to have  some cough and malaise.  Overall, she was feeling better and felt like she could remain adequately  hydrated at home. Respiratory culture grew out a staph aureus, sensitive to  Azithromycin.   DISCHARGE LABORATORY:  The patient had a C-diff culture which was negative.  Discharge B-met revealed sodium of 141, potassium 3.6, chloride 112, bicarb  26, glucose 74, BUN 5, creatinine 0.7, and calcium 7.6. White count 5.4.  Hemoglobin 9.6. Platelets 289,000.                                               Bradly Bienenstock, M.D.    Arliss Journey  D:  05/17/2002  T:  05/18/2002  Job:  [id number]   cc:   Conni Elliot, M.D.  [address]  [place]  [place] [address]  Fax: [phone]   Kevin Fenton, M.D.  [facility]  [place]  [place] [address]  Fax: [phone]

## 2010-10-28 NOTE — Discharge Summary (Signed)
NAME:  Karen Brennan, Karen Brennan                ACCOUNT NO.:  [id number]   MEDICAL RECORD NO.:  [id number]          PATIENT TYPE:  IPS   LOCATION:  0403                          [facility]   PHYSICIAN:  Jasmine Pang, M.D. DATE OF BIRTH:  Jun 09, 1976   DATE OF ADMISSION:  09/26/2005  DATE OF DISCHARGE:  10/01/2005                                 DISCHARGE SUMMARY   IDENTIFYING INFORMATION:  This is a 35 year old single African-American  female who was admitted on a voluntary basis on September 26, 2005.   HISTORY OF PRESENT ILLNESS:  The patient reports a history of confusion,  including not recognizing her children.  She reports feeling depressed and  hopeless so she denied suicidal or homicidal ideation.  She states she  forgets her meds periodically.  She feels people are looking into her home.  She states her therapist wanted her to be assessed.   PAST PSYCHIATRIC HISTORY:  History of bipolar disorder.  No prior suicide  attempts.   FAMILY HISTORY:  Brother has a history of either bipolar or schizophrenia.   SUBSTANCE ABUSE:  Nonsmoker.  No alcohol or drug use.   PAST MEDICAL HISTORY:  Hypertension.   MEDICATIONS:  None.   DRUG ALLERGIES:  PYRIDIUM.   PHYSICAL EXAMINATION:  The patient was noted to be morbidly obese with no  acute medical problems.   LABORATORY DATA:  Hepatic profile was within normal limits.  TSH was within  normal limits.  Other labs were done in the ED.   HOSPITAL COURSE:  Upon admission, the patient was given Ativan 2 mg  immediately, then Ativan 2 mg IM or p.o. q.6h. p.r.n. agitation.  A  bariatric bed was ordered.  She was also placed on Haldol 5 mg p.o. at  admission and Haldol 5 mg p.o. q.h.s. from then on.  She was ordered Haldol  5 mg p.o. or IM q.6h. p.r.n. agitation and Cogentin 2 mg p.o. or IM q.8h.  p.r.n. EPS.  She was placed on hydrochlorothiazide 25 mg p.o. q.d.  On September 27, 2005, she was placed on Ambien 10 mg p.o. q.h.s. p.r.n. insomnia  and  Lexapro 10 mg p.o. q.d.  On September 30, 2005, she was placed on ibuprofen 800  mg p.o. q.6h. p.r.n. pain.   In initially talking with me, the patient described her depression and also  was complaining of some auditory and visual hallucinations.  She states she  lives at home with her four children (they were with her mother while she is  in the hospital).  She reports forgetting things and at times not  recognizing her children.  She believed people were trying to look into her  house.  She states to me the depression came back.  Both her PCP and  therapist told her she needed to come into the hospital.  She was having  trouble sleeping with middle of the night awakening and a decreased  appetite.  On September 28, 2005, the patient discussed her depression prior to  admission.  She had begun to feel jittery, jumpy and nervous and  thought she  was having auditory and visual hallucinations.  She denies auditory and  visual hallucinations now.  She discussed her four children, ages 108 months,  55 years old, 34 years old and 34 years old.  She said she got no help from  their father's.  On September 29, 2005, the patient was requesting to go home  but agreeable with the fact that she needed to stay longer.  She was advised  that the medications will need to take longer to get into her system.  Her  sleep had improved.  Her appetite was good.  Side effects were some  drowsiness and sedation.  Through the weekend, the patient did well.  On  Sunday, October 01, 2005, her mental status had improved.  She was less  depressed.  She was less anxious.  There was no suicidal or homicidal  ideation.  There were no auditory or visual hallucinations.  No paranoia or  delusions.  Thought processes were logical and goal-directed.  Thought  content no predominant theme.  Cognitive exam was grossly within normal  limits.   DISCHARGE DIAGNOSES:  AXIS I:  Bipolar disorder, depressed severe with  psychosis.  AXIS II:   None.  AXIS III:  Hypertension, morbid obesity.  AXIS IV:  Moderate (problems with getting financial support to raise her  children).  AXIS V:  GAF upon admission 30; GAF highest past year 65; GAF upon discharge  45.   ACTIVITY/DIET:  There were no specific activity level or dietary  restrictions.   DISCHARGE MEDICATIONS:  1.  Hydrochlorothiazide 25 mg daily.  2.  Haldol 5 mg at bedtime.  3.  Claritin 10 mg, 1/2 tablet daily.  4.  Lexapro 10 mg daily.  5.  Cogentin 2 mg q.8h. p.r.n. EPS.   POST-HOSPITAL CARE PLANS:  The patient will be seen at the [facility]  by Dr. Lang Snow, her psychiatrist and first appointment October 05, 2005 at 2  p.m.      Jasmine Pang, M.D.  Electronically Signed     BHS/MEDQ  D:  10/13/2005  T:  10/14/2005  Job:  [id number]

## 2010-10-28 NOTE — Discharge Summary (Signed)
NAME:  Karen Brennan, Karen Brennan                ACCOUNT NO.:  [id number]   MEDICAL RECORD NO.:  [id number]          PATIENT TYPE:  IPS   LOCATION:  0405                          [facility]   PHYSICIAN:  Jeanice Lim, M.D. DATE OF BIRTH:  [birth year]   DATE OF ADMISSION:  09/14/2004  DATE OF DISCHARGE:  09/20/2004                                 DISCHARGE SUMMARY   IDENTIFYING INFORMATION:  This is a 35 year old single African-American  female, an involuntary admission.  The patient was referred by Dr. Tedd Sias  for her depressive symptoms.  The patient is feeling very sad, crying daily  with decreased sleep, isolating and anhedonia.  The patient also has been  experiencing some problems with anxiety, shortness of breath with suicidal  thoughts with no specific plan.  The patient has had no prior inpatient  hospitalizations.   MEDICAL HISTORY:  Current medical problems are hypertension, environmental  allergies.  The patient is also [redacted] weeks pregnant.   CURRENT MEDICATIONS:  Zoloft 100 mg daily, which she reports is ineffective.   ALLERGIES:  She is allergic to [facility].   PHYSICAL EXAMINATION:  This is an obese female at 400 pounds.  She is well-  developed.  Her neurologic findings were grossly intact.   LABORATORY DATA:  Hemoglobin 11.2, hematocrit 31.3, potassium mildly low at  3.2 as well as her albumin at 2.7.  Her TSH was within normal limits.  Her  urine drug screen was negative.  Her urinalysis showed calcium oxalate  crystals.   HOSPITAL COURSE:  The patient presented with paranoid ideation with positive  auditory hallucinations and depressive symptoms and suicidal ideation.  Contact was made with patient's OB/GYN doctor for recommendations and  recommendations for her anti-psychotic.  We also had a nutritional consult  for patient's weight and low albumin.  The patient was started on Haldol  which was optimized for her psychosis.  The patient improved slowly although  still  experiencing positive auditory hallucinations, hearing a hissing  sound.  She was denying any suicidal thoughts.  The patient's antidepressant  was optimized.  Family session with mother was done and mother had no  significant worries with patient returning home.   CONDITION ON DISCHARGE:  The patient was provided information on her  medications.  She was denying any suicidal or homicidal thoughts.  Her mood,  affect and behavior were appropriate and consistent for this patient.  Family meeting was accomplished.  Medication compliance was reinforced.   DISCHARGE MEDICATIONS:  1.  Hydrochlorothiazide 25 mg daily.  2.  Prenatal vitamins 1 daily.  3.  Haldol 2 mg at bedtime.  4.  Zoloft 100 mg, taking 1-1/2 daily.  5.  The patient was also to take Ensure with fiber twice a day for      nutritional supplements.   FOLLOW UP:  The patient was to follow up with Dr. Melvyn Novas at [facility] on April 27th.  Time was provided.  The patient was also to follow  up with [facility] on Thursday, September 22, 2004 with  address  and phone number provided to patient.   DISCHARGE DIAGNOSES:   AXIS I:  Major depressive disorder with suicidal ideation.   AXIS II:  Deferred.   AXIS III:  1.  Obesity.  2.  Pregnancy.  3.  Hypertension.   AXIS IV:  Problems with primary support group, occupation, economics,  psychosocial problems, single mother of three children, medical problems.   AXIS V:  Current 40; estimated this past year 60.      JO/MEDQ  D:  10/20/2004  T:  10/20/2004  Job:  [id number]

## 2010-10-28 NOTE — Op Note (Signed)
NAME:  Karen Brennan, Karen Brennan                ACCOUNT NO.:  [id number]   MEDICAL RECORD NO.:  [id number]          PATIENT TYPE:  INP   LOCATION:  106                           [facility]ILITY[facility]   PHYSICIAN:  Tanya S. Shawnie Pons, M.D.   DATE OF BIRTH:  1976/05/28   DATE OF PROCEDURE:  03/12/2005  DATE OF DISCHARGE:                                 OPERATIVE REPORT   PREOPERATIVE DIAGNOSIS:  Multiparity, undesired fertility.   POSTOPERATIVE DIAGNOSIS:  Multiparity, undesired fertility.   OPERATION/PROCEDURE:  Postpartum bilateral tubal ligation with Filshie  clips.   SURGEON:  Shelbie Proctor. Shawnie Pons, M.D.   ANESTHESIA:  Local and epidural, Raul Del, M.D.   FINDINGS:  Normal tubes.   ESTIMATED BLOOD LOSS:  25 mL.   COMPLICATIONS:  None.   REASON FOR PROCEDURE:  The patient is a 35 year old gravida 5, para 4-0-1-4,  who desired permanent sterilization.  She was __________ day #1 from a  vaginal delivery who desired permanent sterilization.  The patient was  counseled regarding risks and benefits of this procedure including  permanence of the procedure, risks of failure was 1 in 200 and increased  risk of ectopic.  The patient understood this and was willing to proceed.   DESCRIPTION OF PROCEDURE:  The patient was taken to the operating room where  she was placed in the supine position.  Epidural analgesia was dosed up and  she was prepped and draped in the usual sterile fashion.  Approximately 26  mL of 0.25% Marcaine were used to infiltrate the infraumbilical site where  the incision was found to adequate.  Two Allis clamps were placed across  from each other.  An approximately 3 cm incision was made in the skin and  carried down to the underlying fascia.  The fascia was entered sharply.  The  peritoneal cavity was found and entered sharply with Metzenbaum scissors.  At this point, small Deavers were used for retraction and the uterus was  clearly __________.  The patient's left tube was found,  grasped with a  Babcock clamp and followed to the fimbriated end.  Approximately 2 cm from  the cornua, a Filshie clip was placed across the tube.  This tube was  returned to the abdomen.  The patient's right tube was similarly found,  grasped with a Babcock clamp and carried to the fimbriated end.  Approximately 2 cm from the cornua, a Filshie clip was placed across this  tube as well.  The tubes were returned to the abdomen.  The fascia was then  grasped with two Allis clamps and closed with  a 0 Vicryl suture in a running fashion.  The skin was closed with 4-0 Vicryl  in subcuticular fashion.  The patient tolerated the procedure well.  All  instrument and lab counts were correct x2.  The patient was awakened and  taken to the recovery room in stable condition.           ______________________________  Shelbie Proctor Shawnie Pons, M.D.     TSP/MEDQ  D:  03/12/2005  T:  03/12/2005  Job:  [id number] 

## 2010-10-28 NOTE — Op Note (Signed)
NAME:  Karen Brennan, Karen Brennan                          ACCOUNT NO.:  [id number]   MEDICAL RECORD NO.:  [id number]                   PATIENT TYPE:  OIB   LOCATION:  2866                                 FACILITY:  [facility]   PHYSICIAN:  Zola Button T. Lazarus Salines, M.D.              DATE OF BIRTH:  1975-12-26   DATE OF PROCEDURE:  09/07/2003  DATE OF DISCHARGE:  09/07/2003                                 OPERATIVE REPORT   PREOPERATIVE DIAGNOSIS:  Pharyngeal foreign body.   POSTOPERATIVE DIAGNOSIS:  No pharyngeal foreign body, possible reflux.   OPERATION PERFORMED:  Direct laryngoscopy, esophagoscopy.   SURGEON:  Gloris Manchester. Lazarus Salines, M.D.   ANESTHESIA:  General mask converted to general orotracheal anesthesia.   ESTIMATED BLOOD LOSS:  Minimal.   COMPLICATIONS:  None.   OPERATIVE FINDINGS:  Redundant pharyngeal tissues with 1 to 2+ embedded  tonsils and some cryptic debris, right greater than left.  No pharyngeal  foreign body identified including cervical esophagus.  Normal larynx.  Neck  clear.   DESCRIPTION OF PROCEDURE:  With the patient in a comfortable supine  position, general mask anesthesia was administered.  At an appropriate  level, a rubber gum guard was applied.  The Jackson sliding laryngoscope was  introduced taking care to protect lips and teeth.  Inspection of the  valleculae, piriforms, endolarynx, and postcricoid larynx down to the level  of the cricopharyngeus and upper cervical esophagus readily performed  with  no foreign body identified.  The slide was removed and the patient was  intubated without difficulty. Anesthesia was assumed per orotracheal tube.   The entire oral cavity and pharynx was palpated with no significant  findings.  The cervical esophagoscope was lubricated and easily inserted and  passed to its full length.  There was some frothy phlegm in the cervical  esophagus but no evidence of erythema and no foreign body identified.  The  esophagoscope was removed.   The dedo laryngoscope was introduced and once  again, full inspection of base of tongue, vallecula, piriform sinus on each  side, postcricoid larynx, and endolarynx was performed several times over  with no foreign body identified.  There was cryptic debris easily expressed  from the inferior pole of the right tonsil.  No excoriation, bleeding,  erythema or signs of infection.  At this point the procedure was completed.  The laryngoscope was removed.  The tooth guard was removed.  The dental  status was intact.  The patient was returned to anesthesia, awakened,  extubated and transferred to recovery in stable condition.   COMMENT:  A 35 year old black female ingested a presumptive chicken bone 24  hours ago with persistent pain in her throat precluding swallowing without  significant discomfort was the indication for today's procedure.  No  pharyngeal foreign body identified.  I suspect she has either had an  excoriation, reflux or maybe sensing the  tonsillolith debris as a  foreign body sensation.  She will require no  prescriptions and can resume diet and activity promptly.  No return visit  necessary.  Given low anticipated risks of post anesthetic or post surgical  complications,  I feel an outpatient venue is appropriate.                                               Gloris Manchester. Lazarus Salines, M.D.    KTW/MEDQ  D:  09/07/2003  T:  09/08/2003  Job:  [id number]

## 2010-10-28 NOTE — Discharge Summary (Signed)
NAME:  Karen Brennan, Karen Brennan                ACCOUNT NO.:  [id number]   MEDICAL RECORD NO.:  [id number]          PATIENT TYPE:  INP   LOCATION:  9106                          [facility]   PHYSICIAN:  Conni Elliot, M.D.DATE OF BIRTH:  Aug 27, 1975   DATE OF ADMISSION:  03/10/2005  DATE OF DISCHARGE:  03/13/2005                                 DISCHARGE SUMMARY   The patient was admitted on March 10, 2005, as a full-term in labor.  She was a 35 year old G5, P4-0-1-4, at discharge, after a normal spontaneous  vaginal delivery after one push with estimated blood loss less than 300 mL,  RPR nonreactive, GBS negative.  Apgars 9 and 9.  The pregnancy was  complicated by the patient having some psychiatric issues and being on  Haldol/Zoloft/Ambien.  On March 12, 2005, she also had a bilateral tubal  ligation.  This went without complication.  She was stable at discharge  postop day #1 postpartum day #2.  No lacerations.  She did develop a right-  sided eye hordeolum that was treated in the hospital with warm compresses,  and she was discharged home with erythromycin.   DISCHARGE DIAGNOSES:  1.  Normal spontaneous vaginal delivery.  2.  Bilateral tubal ligation.  3.  Obesity.  4.  Right eye hordeolum.  5.  Depression.   MEDICATIONS AT DISCHARGE:  1.  Erythromycin.  2.  Percocet.  3.  Motrin.   The patient is going to bottle-feed.   DISCHARGE INSTRUCTIONS:  Pelvic rest for six weeks.  Follow up in six weeks  with Penni Bombard, M.D.   Hemoglobin at discharge 12, 37.5.      Karen Brennan, M.D.    ______________________________  Conni Elliot, M.D.    AL/MEDQ  D:  03/13/2005  T:  03/13/2005  Job:  [id number]

## 2010-11-01 ENCOUNTER — Other Ambulatory Visit: Payer: Self-pay | Admitting: Family Medicine

## 2010-11-01 NOTE — Telephone Encounter (Signed)
Refill request

## 2010-11-15 ENCOUNTER — Encounter: Payer: Self-pay | Admitting: Family Medicine

## 2010-11-15 ENCOUNTER — Ambulatory Visit (INDEPENDENT_AMBULATORY_CARE_PROVIDER_SITE_OTHER): Payer: Medicaid Other | Admitting: Family Medicine

## 2010-11-15 VITALS — BP 132/82 | Wt >= 6400 oz

## 2010-11-15 DIAGNOSIS — I10 Essential (primary) hypertension: Secondary | ICD-10-CM

## 2010-11-15 MED ORDER — LISINOPRIL 5 MG PO TABS: 5.0000 mg | ORAL_TABLET | Freq: Every day | ORAL | Status: DC

## 2010-11-15 NOTE — Progress Notes (Signed)
HTN: Pt has continued to have some elevated BP's. She has been working out at [employer] and they have suggested that she continue to be checked out for the hypertension. She is doing well with the exercise and her BP has actually come down a little from her last appointment but not down into the 120/80's. She is still planning to have surgery but has decided on the gastric bypass surgery. She has gotten a Systems analyst and is trying to lose as much weight on her own before the surgery. We discussed how her BP may drop with weight loss and that she needs to have it checked.   ROS: neg except for a headache currently.   PE:  Gen: seated on table comfortably.  CV: RRR, no murmur Pulm: CTAB, no wheezing

## 2010-11-15 NOTE — Patient Instructions (Signed)
Come back in 2 weeks after starting your new BP medicine to have your BP rechecked and make sure it's not going to low.

## 2010-11-15 NOTE — Assessment & Plan Note (Signed)
Pt has elevated blood pressure and has been taking HCTZ but has not been able to get her BP into the 120's/80's consistantly.  Plan to add Lisinopril to her regimem and have her return in 2 weeks for a BP check.

## 2010-12-01 ENCOUNTER — Encounter: Payer: Self-pay | Admitting: Family Medicine

## 2010-12-01 ENCOUNTER — Ambulatory Visit (INDEPENDENT_AMBULATORY_CARE_PROVIDER_SITE_OTHER): Payer: Medicaid Other | Admitting: Family Medicine

## 2010-12-01 VITALS — BP 130/84 | HR 86 | Ht 73.0 in | Wt >= 6400 oz

## 2010-12-01 DIAGNOSIS — K0889 Other specified disorders of teeth and supporting structures: Secondary | ICD-10-CM | POA: Insufficient documentation

## 2010-12-01 DIAGNOSIS — K089 Disorder of teeth and supporting structures, unspecified: Secondary | ICD-10-CM

## 2010-12-01 DIAGNOSIS — I10 Essential (primary) hypertension: Secondary | ICD-10-CM

## 2010-12-01 MED ORDER — OXYCODONE-ACETAMINOPHEN 7.5-500 MG PO TABS: 1.0000 | ORAL_TABLET | Freq: Four times a day (QID) | ORAL | Status: DC | PRN

## 2010-12-01 NOTE — Patient Instructions (Signed)
Take Ibuprofen every 6 hours (up to 600 mg) and stagger with the Percocet every 6 hours as needed so that you have something on board every 3 hours for the next few days.

## 2010-12-01 NOTE — Progress Notes (Signed)
Dental pain:  Pt had all 4 wisdom teeth pulled 9 days ago. She had dry socket and had to have it packet. She has been doing cold compresses to her jaw, swishing with salt water and taking her pain meds (PErcocet) as prescribed. She is still having a lot of pain. Advised the patient to discuss with her oral surgeon if she continues to have pain into next week.   HTN: Pt had developed some hypertension and needs to have it under control before she is eligible for her gastric bypass surgery. She has not been taking it the last few days because it hurts to open her mouth. Her BP is ok today but not perfect.  Advised to come back when she has been taking her meds and we will recheck her BP.   ROS: neg except as noted in HPI.   PE:  Mouth: There is no erythema or pus draining in the mouth. The wounds appear to be healing nicely but the patient has a lot of tenderness around the jaw.

## 2010-12-01 NOTE — Assessment & Plan Note (Signed)
PT had all 4 wisdom teeth pulled. Had some complications, has been taking percocet but is now out and is not doing well with the pain.  Refilled today for 20 more pills.

## 2010-12-01 NOTE — Assessment & Plan Note (Signed)
Pt suppose to be evaluated on the new lisinopril medication today but she has not been taking it because she can't open her mouth much due to dental pain.  She is also in pain causing her BP to be higher.  Plan to have her come back in about 2 weeks for a BP recheck.

## 2010-12-16 ENCOUNTER — Other Ambulatory Visit: Payer: Self-pay | Admitting: Family Medicine

## 2010-12-16 NOTE — Telephone Encounter (Signed)
Refill request

## 2010-12-19 NOTE — Telephone Encounter (Signed)
Refill request

## 2011-01-02 ENCOUNTER — Encounter: Payer: Self-pay | Admitting: Family Medicine

## 2011-01-20 ENCOUNTER — Other Ambulatory Visit: Payer: Self-pay | Admitting: Family Medicine

## 2011-01-20 NOTE — Telephone Encounter (Signed)
Refill request

## 2011-02-03 ENCOUNTER — Ambulatory Visit (INDEPENDENT_AMBULATORY_CARE_PROVIDER_SITE_OTHER): Payer: Medicaid Other | Admitting: Family Medicine

## 2011-02-03 ENCOUNTER — Encounter: Payer: Self-pay | Admitting: Family Medicine

## 2011-02-03 DIAGNOSIS — M25571 Pain in right ankle and joints of right foot: Secondary | ICD-10-CM | POA: Insufficient documentation

## 2011-02-03 DIAGNOSIS — M25579 Pain in unspecified ankle and joints of unspecified foot: Secondary | ICD-10-CM

## 2011-02-03 MED ORDER — CYCLOBENZAPRINE HCL 5 MG PO TABS: 5.0000 mg | ORAL_TABLET | Freq: Every evening | ORAL | Status: DC | PRN

## 2011-02-03 NOTE — Patient Instructions (Signed)
I will send flexiril to your pharmacy THis may make you sleepy, please do not drive with it I think you have a mild strain, but nothing is broken Ice will help a little and rest You can you an ACE wrap if it would feel betterAnkle Sprain An ankle sprain happens when the ligaments that hold the ankle joint together stretch or tear. Sometimes it is called a "twisted" ankle. HOME CARE   Walk as little as possible for the first 2 days.   Use crutches if your doctor prescribes them.   Use crutches until you or your child can walk without pain.   Put ice on the ankle for 15 minutes, three times a day.   Put ice in a plastic bag.   Place a towel between the skin and the bag.   You may stop icing when the puffiness (swelling) goes down.   Keep the leg up (elevated) when sitting.   Use an elastic wrap, tape, or splint for support.   Take the wrap, tape, or splint off if the toes turn cold or bluish.   Only take medicine as told by your doctor.   It may take weeks before you or your child can walk without pain.  GET HELP RIGHT AWAY IF:    The toes are numb or blue.   There is more pain after 3 days.   The lower leg is puffy, tender, or red.  MAKE SURE YOU:    Understand these instructions.   Will watch this condition.   Will get help right away if you or your child is not doing well or gets worse.  Document Released: 11/15/2007 Document Re-Released: 08/23/2009 [facility] Patient Information 2011 [place], [place].

## 2011-02-03 NOTE — Progress Notes (Signed)
  Subjective:    Patient ID: Karen Brennan, female    DOB: [birth year], 35 y.o.   MRN: [id number]  HPI Yesterday pt stepping into a puddle and slipped off her flip flop.  She could walk and did not fall.  She limped a bit at first but then thought she was ok.  That night her thigh and knee started hurting and she had cramping.  Today she feels sore in her quads and her hamstrings as well as her ankle   Review of Systems Denies CP, SOB, HA, N/V/D, fever     Objective:   Physical Exam Vital signs reviewed General appearance - alert, well appearing, and in no distress and oriented to person, place, and time Right knee-  Good endpoints on ligaments, no edema no heat no crepitus Right ankle- trace edema nonpitting, no point tnderness, negative homan's sign no bruising Able to walk with minimal limp       Assessment & Plan:  Right ankle pain No evidence of fracture with [place] ankle rules.  Mild swelling.  Advised RICE until feeling better

## 2011-02-03 NOTE — Assessment & Plan Note (Signed)
No evidence of fracture with [place] ankle rules.  Mild swelling.  Advised RICE until feeling better

## 2011-03-21 ENCOUNTER — Ambulatory Visit (INDEPENDENT_AMBULATORY_CARE_PROVIDER_SITE_OTHER): Payer: Medicaid Other | Admitting: Family Medicine

## 2011-03-21 DIAGNOSIS — R51 Headache: Secondary | ICD-10-CM

## 2011-03-21 NOTE — Patient Instructions (Addendum)
Take topiramate 2 x day as directed. Work on sleep hygiene (see handout) Avoid using motrin as much as possible- only use for headaches that are severe- 8/10.  Return to see Dr. Rivka Safer in 1 month.

## 2011-03-21 NOTE — Progress Notes (Signed)
  Subjective:    Patient ID: Karen Brennan, female    DOB: Mar 23, 1976, 35 y.o.   MRN: [id number]  HPI Headaches/migraines: Patient states that she has had daily migraine headaches x2 months. This is worse compared to the past. She states that she has had increased stress at home, and this may be contributing. Patient is also planning for gastric bypass and has been on a strict diet. Patient states that the headaches are severe, positive light photophobia, positive noise sensitivity, positive nausea, no vomiting, headaches are better with Motrin she takes it 2 times per day, she sleeps from 10 PM to 5 AM, she does have a hard time falling asleep, and does have to nighttime awakenings. Patient is taking Topamax 50 mg once daily-she states she did not know that she should be taking this twice a day as it is on her medicine list.   Review of Systems As per above. No fever. No shortness of breath. No weakness.    Objective:   Physical Exam  Constitutional: She is oriented to person, place, and time. She appears well-developed and well-nourished.       Obese   HENT:  Head: Normocephalic and atraumatic.  Cardiovascular: Normal rate, regular rhythm and normal heart sounds.   No murmur heard. Pulmonary/Chest: Effort normal. No respiratory distress.  Musculoskeletal: Normal range of motion. She exhibits edema (trace edema).  Neurological: She is alert and oriented to person, place, and time. She has normal reflexes. She displays normal reflexes. No cranial nerve deficit. She exhibits normal muscle tone. Coordination normal.       Strength 5/5 bilateral in lower ext, sensation intact bilateral in lower ext, and reflexes equal bilateral in lower ext.   Skin: No rash noted.  Psychiatric: She has a normal mood and affect. Her behavior is normal.          Assessment & Plan:

## 2011-03-26 DIAGNOSIS — R519 Headache, unspecified: Secondary | ICD-10-CM | POA: Insufficient documentation

## 2011-03-26 DIAGNOSIS — R51 Headache: Secondary | ICD-10-CM | POA: Insufficient documentation

## 2011-03-26 NOTE — Assessment & Plan Note (Signed)
Take topiramate 2 x day as directed- had only been taking 1 x per day.  Work on sleep hygiene (see handout) Avoid using motrin as much as possible- only use for headaches that are severe- 8/10. - to avoid rebound headache Return to see Dr. Rivka Safer in 1 month for follow up to ensure that headaches are improving.

## 2011-04-19 ENCOUNTER — Ambulatory Visit (INDEPENDENT_AMBULATORY_CARE_PROVIDER_SITE_OTHER): Payer: Medicaid Other | Admitting: Family Medicine

## 2011-04-19 DIAGNOSIS — R51 Headache: Secondary | ICD-10-CM

## 2011-04-19 DIAGNOSIS — M549 Dorsalgia, unspecified: Secondary | ICD-10-CM | POA: Insufficient documentation

## 2011-04-19 DIAGNOSIS — Z23 Encounter for immunization: Secondary | ICD-10-CM

## 2011-04-19 DIAGNOSIS — E669 Obesity, unspecified: Secondary | ICD-10-CM

## 2011-04-19 MED ORDER — CYCLOBENZAPRINE HCL 5 MG PO TABS: 5.0000 mg | ORAL_TABLET | Freq: Three times a day (TID) | ORAL | Status: DC | PRN

## 2011-04-19 MED ORDER — GABAPENTIN 100 MG PO CAPS: 300.0000 mg | ORAL_CAPSULE | Freq: Every day | ORAL | Status: DC

## 2011-04-19 NOTE — Assessment & Plan Note (Signed)
Patient's back pain no signs of paraspinal tenderness at this time patient has full range of motion likely secondary to mechanical back pain due to her weight. Encourage patient to continue exercises at this time. Did refill her Flexeril to help with any type of tightness encourage patient to do stretches on a regular routine and given a handout on proper stretching techniques. Patient knows of red flags to look out for which we radiation of pain or bowel or bladder problems and would come back in seek medical attention. The most relevant thing patient can do for her self would be to lose weight

## 2011-04-19 NOTE — Assessment & Plan Note (Signed)
Wt Readings from Last 3 Encounters:  04/19/11 454 lb (205.933 kg)  03/21/11 464 lb (210.469 kg)  02/03/11 482 lb (218.634 kg)    Patient is down 30 pounds last 3 months. Encourage patient to continue the same regimen she is doing. Patient is seen some before gastric bypass surgery which in this individual would be a good thing.

## 2011-04-19 NOTE — Patient Instructions (Addendum)
Next to meet you. I when she to try a new medicine called gabapentin. I when she to take one pill at night for the next 3 days then 2 pills at night for 3 days then 3 pills at night thereafter I am refilling your Flexeril I when she to come back and see your primary care provider in 3-4 weeks to see if you're improving For your back pain please do the following exercises Do each of these exercises for 15 reps 2 sets a day  Low Back Sprain with Rehab  A sprain is an injury in which a ligament is torn. The ligaments of the lower back are vulnerable to sprains. However, they are strong and require great force to be injured. These ligaments are important for stabilizing the spinal column. Sprains are classified into three categories. Grade 1 sprains cause pain, but the tendon is not lengthened. Grade 2 sprains include a lengthened ligament, due to the ligament being stretched or partially ruptured. With grade 2 sprains there is still function, although the function may be decreased. Grade 3 sprains involve a complete tear of the tendon or muscle, and function is usually impaired. SYMPTOMS   Severe pain in the lower back.   Sometimes, a feeling of a "pop," "snap," or tear, at the time of injury.   Tenderness and sometimes swelling at the injury site.   Uncommonly, bruising (contusion) within 48 hours of injury.   Muscle spasms in the back.  CAUSES  Low back sprains occur when a force is placed on the ligaments that is greater than they can handle. Common causes of injury include:  Performing a stressful act while off-balance.   Repetitive stressful activities that involve movement of the lower back.   Direct hit (trauma) to the lower back.  RISK INCREASES WITH:  Contact sports (football, wrestling).   Collisions (major skiing accidents).   Sports that require throwing or lifting (baseball, weightlifting).   Sports involving twisting of the spine (gymnastics, diving, tennis, golf).    Poor strength and flexibility.   Inadequate protection.   Previous back injury or surgery (especially fusion).  PREVENTION  Wear properly fitted and padded protective equipment.   Warm up and stretch properly before activity.   Allow for adequate recovery between workouts.   Maintain physical fitness:   Strength, flexibility, and endurance.   Cardiovascular fitness.   Maintain a healthy body weight.  PROGNOSIS  If treated properly, low back sprains usually heal with non-surgical treatment. The length of time for healing depends on the severity of the injury.  RELATED COMPLICATIONS   Recurring symptoms, resulting in a chronic problem.   Chronic inflammation and pain in the low back.   Delayed healing or resolution of symptoms, especially if activity is resumed too soon.   Prolonged impairment.   Unstable or arthritic joints of the low back.  TREATMENT  Treatment first involves the use of ice and medicine, to reduce pain and inflammation. The use of strengthening and stretching exercises may help reduce pain with activity. These exercises may be performed at home or with a therapist. Severe injuries may require referral to a therapist for further evaluation and treatment, such as ultrasound. Your caregiver may advise that you wear a back brace or corset, to help reduce pain and discomfort. Often, prolonged bed rest results in greater harm then benefit. Corticosteroid injections may be recommended. However, these should be reserved for the most serious cases. It is important to avoid using your back when  lifting objects. At night, sleep on your back on a firm mattress, with a pillow placed under your knees. If non-surgical treatment is unsuccessful, surgery may be needed.  MEDICATION   If pain medicine is needed, nonsteroidal anti-inflammatory medicines (aspirin and ibuprofen), or other minor pain relievers (acetaminophen), are often advised.   Do not take pain medicine for 7  days before surgery.   Prescription pain relievers may be given, if your caregiver thinks they are needed. Use only as directed and only as much as you need.   Ointments applied to the skin may be helpful.   Corticosteroid injections may be given by your caregiver. These injections should be reserved for the most serious cases, because they may only be given a certain number of times.  HEAT AND COLD  Cold treatment (icing) should be applied for 10 to 15 minutes every 2 to 3 hours for inflammation and pain, and immediately after activity that aggravates your symptoms. Use ice packs or an ice massage.   Heat treatment may be used before performing stretching and strengthening activities prescribed by your caregiver, physical therapist, or athletic trainer. Use a heat pack or a warm water soak.  SEEK MEDICAL CARE IF:   Symptoms get worse or do not improve in 2 to 4 weeks, despite treatment.   You develop numbness or weakness in either leg.   You lose bowel or bladder function.   Any of the following occur after surgery: fever, increased pain, swelling, redness, drainage of fluids, or bleeding in the affected area.   New, unexplained symptoms develop. (Drugs used in treatment may produce side effects.)  EXERCISES  RANGE OF MOTION (ROM) AND STRETCHING EXERCISES - Low Back Sprain Most people with lower back pain will find that their symptoms get worse with excessive bending forward (flexion) or arching at the lower back (extension). The exercises that will help resolve your symptoms will focus on the opposite motion.  Your physician, physical therapist or athletic trainer will help you determine which exercises will be most helpful to resolve your lower back pain. Do not complete any exercises without first consulting with your caregiver. Discontinue any exercises which make your symptoms worse, until you speak to your caregiver. If you have pain, numbness or tingling which travels down into  your buttocks, leg or foot, the goal of the therapy is for these symptoms to move closer to your back and eventually resolve. Sometimes, these leg symptoms will get better, but your lower back pain may worsen. This is often an indication of progress in your rehabilitation. Be very alert to any changes in your symptoms and the activities in which you participated in the 24 hours prior to the change. Sharing this information with your caregiver will allow him or her to most efficiently treat your condition. These exercises may help you when beginning to rehabilitate your injury. Your symptoms may resolve with or without further involvement from your physician, physical therapist or athletic trainer. While completing these exercises, remember:   Restoring tissue flexibility helps normal motion to return to the joints. This allows healthier, less painful movement and activity.   An effective stretch should be held for at least 30 seconds.   A stretch should never be painful. You should only feel a gentle lengthening or release in the stretched tissue.  FLEXION RANGE OF MOTION AND STRETCHING EXERCISES: STRETCH - Flexion, Single Knee to Chest   Lie on a firm bed or floor with both legs extended in front of  you.   Keeping one leg in contact with the floor, bring your opposite knee to your chest. Hold your leg in place by either grabbing behind your thigh or at your knee.   Pull until you feel a gentle stretch in your low back. Hold __________ seconds.   Slowly release your grasp and repeat the exercise with the opposite side.  Repeat __________ times. Complete this exercise __________ times per day.  STRETCH - Flexion, Double Knee to Chest  Lie on a firm bed or floor with both legs extended in front of you.   Keeping one leg in contact with the floor, bring your opposite knee to your chest.   Tense your stomach muscles to support your back and then lift your other knee to your chest. Hold your legs  in place by either grabbing behind your thighs or at your knees.   Pull both knees toward your chest until you feel a gentle stretch in your low back. Hold __________ seconds.   Tense your stomach muscles and slowly return one leg at a time to the floor.  Repeat __________ times. Complete this exercise __________ times per day.  STRETCH - Low Trunk Rotation  Lie on a firm bed or floor. Keeping your legs in front of you, bend your knees so they are both pointed toward the ceiling and your feet are flat on the floor.   Extend your arms out to the side. This will stabilize your upper body by keeping your shoulders in contact with the floor.   Gently and slowly drop both knees together to one side until you feel a gentle stretch in your low back. Hold for __________ seconds.   Tense your stomach muscles to support your lower back as you bring your knees back to the starting position. Repeat the exercise to the other side.  Repeat __________ times. Complete this exercise __________ times per day  EXTENSION RANGE OF MOTION AND FLEXIBILITY EXERCISES: STRETCH - Extension, Prone on Elbows   Lie on your stomach on the floor, a bed will be too soft. Place your palms about shoulder width apart and at the height of your head.   Place your elbows under your shoulders. If this is too painful, stack pillows under your chest.   Allow your body to relax so that your hips drop lower and make contact more completely with the floor.   Hold this position for __________ seconds.   Slowly return to lying flat on the floor.  Repeat __________ times. Complete this exercise __________ times per day.  RANGE OF MOTION - Extension, Prone Press Ups  Lie on your stomach on the floor, a bed will be too soft. Place your palms about shoulder width apart and at the height of your head.   Keeping your back as relaxed as possible, slowly straighten your elbows while keeping your hips on the floor. You may adjust the  placement of your hands to maximize your comfort. As you gain motion, your hands will come more underneath your shoulders.   Hold this position __________ seconds.   Slowly return to lying flat on the floor.  Repeat __________ times. Complete this exercise __________ times per day.  RANGE OF MOTION- Quadruped, Neutral Spine   Assume a hands and knees position on a firm surface. Keep your hands under your shoulders and your knees under your hips. You may place padding under your knees for comfort.   Drop your head and point your tailbone toward the  ground below you. This will round out your lower back like an angry cat. Hold this position for __________ seconds.   Slowly lift your head and release your tail bone so that your back sags into a large arch, like an old horse.   Hold this position for __________ seconds.   Repeat this until you feel limber in your low back.   Now, find your "sweet spot." This will be the most comfortable position somewhere between the two previous positions. This is your neutral spine. Once you have found this position, tense your stomach muscles to support your low back.   Hold this position for __________ seconds.  Repeat __________ times. Complete this exercise __________ times per day.  STRENGTHENING EXERCISES - Low Back Sprain These exercises may help you when beginning to rehabilitate your injury. These exercises should be done near your "sweet spot." This is the neutral, low-back arch, somewhere between fully rounded and fully arched, that is your least painful position. When performed in this safe range of motion, these exercises can be used for people who have either a flexion or extension based injury. These exercises may resolve your symptoms with or without further involvement from your physician, physical therapist or athletic trainer. While completing these exercises, remember:   Muscles can gain both the endurance and the strength needed for  everyday activities through controlled exercises.   Complete these exercises as instructed by your physician, physical therapist or athletic trainer. Increase the resistance and repetitions only as guided.   You may experience muscle soreness or fatigue, but the pain or discomfort you are trying to eliminate should never worsen during these exercises. If this pain does worsen, stop and make certain you are following the directions exactly. If the pain is still present after adjustments, discontinue the exercise until you can discuss the trouble with your caregiver.  STRENGTHENING - Deep Abdominals, Pelvic Tilt   Lie on a firm bed or floor. Keeping your legs in front of you, bend your knees so they are both pointed toward the ceiling and your feet are flat on the floor.   Tense your lower abdominal muscles to press your low back into the floor. This motion will rotate your pelvis so that your tail bone is scooping upwards rather than pointing at your feet or into the floor.  With a gentle tension and even breathing, hold this position for __________ seconds. Repeat __________ times. Complete this exercise __________ times per day.  STRENGTHENING - Abdominals, Crunches   Lie on a firm bed or floor. Keeping your legs in front of you, bend your knees so they are both pointed toward the ceiling and your feet are flat on the floor. Cross your arms over your chest.   Slightly tip your chin down without bending your neck.   Tense your abdominals and slowly lift your trunk high enough to just clear your shoulder blades. Lifting higher can put excessive stress on the lower back and does not further strengthen your abdominal muscles.   Control your return to the starting position.  Repeat __________ times. Complete this exercise __________ times per day.  STRENGTHENING - Quadruped, Opposite UE/LE Lift   Assume a hands and knees position on a firm surface. Keep your hands under your shoulders and your  knees under your hips. You may place padding under your knees for comfort.   Find your neutral spine and gently tense your abdominal muscles so that you can maintain this position. Your shoulders and  hips should form a rectangle that is parallel with the floor and is not twisted.   Keeping your trunk steady, lift your right hand no higher than your shoulder and then your left leg no higher than your hip. Make sure you are not holding your breath. Hold this position for __________ seconds.   Continuing to keep your abdominal muscles tense and your back steady, slowly return to your starting position. Repeat with the opposite arm and leg.  Repeat __________ times. Complete this exercise __________ times per day.  STRENGTHENING - Abdominals and Quadriceps, Straight Leg Raise   Lie on a firm bed or floor with both legs extended in front of you.   Keeping one leg in contact with the floor, bend the other knee so that your foot can rest flat on the floor.   Find your neutral spine, and tense your abdominal muscles to maintain your spinal position throughout the exercise.   Slowly lift your straight leg off the floor about 6 inches for a count of 15, making sure to not hold your breath.   Still keeping your neutral spine, slowly lower your leg all the way to the floor.  Repeat this exercise with each leg __________ times. Complete this exercise __________ times per day. POSTURE AND BODY MECHANICS CONSIDERATIONS - Low Back Sprain Keeping correct posture when sitting, standing or completing your activities will reduce the stress put on different body tissues, allowing injured tissues a chance to heal and limiting painful experiences. The following are general guidelines for improved posture. Your physician or physical therapist will provide you with any instructions specific to your needs. While reading these guidelines, remember:  The exercises prescribed by your provider will help you have the  flexibility and strength to maintain correct postures.   The correct posture provides the best environment for your joints to work. All of your joints have less wear and tear when properly supported by a spine with good posture. This means you will experience a healthier, less painful body.   Correct posture must be practiced with all of your activities, especially prolonged sitting and standing. Correct posture is as important when doing repetitive low-stress activities (typing) as it is when doing a single heavy-load activity (lifting).  RESTING POSITIONS Consider which positions are most painful for you when choosing a resting position. If you have pain with flexion-based activities (sitting, bending, stooping, squatting), choose a position that allows you to rest in a less flexed posture. You would want to avoid curling into a fetal position on your side. If your pain worsens with extension-based activities (prolonged standing, working overhead), avoid resting in an extended position such as sleeping on your stomach. Most people will find more comfort when they rest with their spine in a more neutral position, neither too rounded nor too arched. Lying on a non-sagging bed on your side with a pillow between your knees, or on your back with a pillow under your knees will often provide some relief. Keep in mind, being in any one position for a prolonged period of time, no matter how correct your posture, can still lead to stiffness. PROPER SITTING POSTURE In order to minimize stress and discomfort on your spine, you must sit with correct posture. Sitting with good posture should be effortless for a healthy body. Returning to good posture is a gradual process. Many people can work toward this most comfortably by using various supports until they have the flexibility and strength to maintain this posture on  their own. When sitting with proper posture, your ears will fall over your shoulders and your  shoulders will fall over your hips. You should use the back of the chair to support your upper back. Your lower back will be in a neutral position, just slightly arched. You may place a small pillow or folded towel at the base of your lower back for  support.  When working at a desk, create an environment that supports good, upright posture. Without extra support, muscles tire, which leads to excessive strain on joints and other tissues. Keep these recommendations in mind: CHAIR:  A chair should be able to slide under your desk when your back makes contact with the back of the chair. This allows you to work closely.   The chair's height should allow your eyes to be level with the upper part of your monitor and your hands to be slightly lower than your elbows.  BODY POSITION  Your feet should make contact with the floor. If this is not possible, use a foot rest.   Keep your ears over your shoulders. This will reduce stress on your neck and low back.  INCORRECT SITTING POSTURES  If you are feeling tired and unable to assume a healthy sitting posture, do not slouch or slump. This puts excessive strain on your back tissues, causing more damage and pain. Healthier options include:  Using more support, like a lumbar pillow.   Switching tasks to something that requires you to be upright or walking.   Talking a brief walk.   Lying down to rest in a neutral-spine position.  PROLONGED STANDING WHILE SLIGHTLY LEANING FORWARD  When completing a task that requires you to lean forward while standing in one place for a long time, place either foot up on a stationary 2-4 inch high object to help maintain the best posture. When both feet are on the ground, the lower back tends to lose its slight inward curve. If this curve flattens (or becomes too large), then the back and your other joints will experience too much stress, tire more quickly, and can cause pain. CORRECT STANDING POSTURES Proper standing  posture should be assumed with all daily activities, even if they only take a few moments, like when brushing your teeth. As in sitting, your ears should fall over your shoulders and your shoulders should fall over your hips. You should keep a slight tension in your abdominal muscles to brace your spine. Your tailbone should point down to the ground, not behind your body, resulting in an over-extended swayback posture.  INCORRECT STANDING POSTURES  Common incorrect standing postures include a forward head, locked knees and/or an excessive swayback. WALKING Walk with an upright posture. Your ears, shoulders and hips should all line-up. PROLONGED ACTIVITY IN A FLEXED POSITION When completing a task that requires you to bend forward at your waist or lean over a low surface, try to find a way to stabilize 3 out of 4 of your limbs. You can place a hand or elbow on your thigh or rest a knee on the surface you are reaching across. This will provide you more stability, so that your muscles do not tire as quickly. By keeping your knees relaxed, or slightly bent, you will also reduce stress across your lower back. CORRECT LIFTING TECHNIQUES DO :  Assume a wide stance. This will provide you more stability and the opportunity to get as close as possible to the object which you are lifting.   Tense  your abdominals to brace your spine. Bend at the knees and hips. Keeping your back locked in a neutral-spine position, lift using your leg muscles. Lift with your legs, keeping your back straight.   Test the weight of unknown objects before attempting to lift them.   Try to keep your elbows locked down at your sides in order get the best strength from your shoulders when carrying an object.   Always ask for help when lifting heavy or awkward objects.  INCORRECT LIFTING TECHNIQUES DO NOT:   Lock your knees when lifting, even if it is a small object.   Bend and twist. Pivot at your feet or move your feet when  needing to change directions.   Assume that you can safely pick up even a paperclip without proper posture.  Document Released: 05/29/2005 Document Revised: 02/08/2011 Document Reviewed: 09/10/2008 [facility] Patient Information 2012 [employer], [place].

## 2011-04-19 NOTE — Progress Notes (Signed)
  Subjective:    Patient ID: Karen Brennan, female    DOB: 1976-05-27, 35 y.o.   MRN: [id number]  HPI 35 year old female coming in with continued headache. Patient has been seen for headache previously by Dr. Edmonia James. Patient was started on Topamax any did increase her dose to the maximum 100 mg a day. Patient states that this time she has not noticed much improvement and may have even had worsening. Patient states that most of her headaches occur in the morning but some also occur during the day. Patient states that most of whom are in the frontal area and bandlike in nature with positive photophobia and maybe some nausea. Patient knows she should not be taking ibuprofen but hasn't taken her from time to time because it does seem to help. Patient stated that Flexeril did help some but she did run out of this medication long ago. Patient denies any visual changes or denies any numbness or weakness in the extremities  Patient is also complaining of low back pain. Patient has been doing an exercise program attempt to lose weight so she can have gastric bypass surgery. Patient has lost about 52 pounds over the course of the last 6 months but since she's increase her activity as started having this low back pain mostly right-sided no radiation no bowel or bladder problems and no numbness or weakness in the extremities   Review of Systems As stated in history of present illness    Objective:   Physical Exam Constitutional: She is oriented to person, place, and time. Morbidly Obese   HENT: Tympanic membranes are nonbulging non-erythemic bilaterally moist mucous membranes pupils equal round reactive to light Cardiovascular: Normal rate, regular rhythm and normal heart sounds.  No murmur heard. Pulmonary/Chest: Effort normal. No respiratory distress.  Musculoskeletal: Normal range of motion. Full range of motion of the back negative straight leg test  Neurological: She is alert and oriented to person,  place, and time. She has normal reflexes. She displays normal reflexes. No cranial nerve deficit. She exhibits normal muscle tone. Coordination normal.       Strength 5/5 bilateral in lower ext, sensation intact bilateral in lower ext, and reflexes equal bilateral in lower ext.   Skin: No rash noted.  Psychiatric: She has a normal mood and affect. Her behavior is normal.      Assessment & Plan:

## 2011-04-19 NOTE — Assessment & Plan Note (Signed)
Headache has some signs of migraines but also has some signs of tension headache. Patient is on multiple different type of medications which include SSRIs as well as Seroquel and Klonopin. Patient does have significant hypertension so do not feel comfortable giving sumatriptan so instead we will start another prophylactic medicine and gabapentin. This medicine may also help her back pain and we'll see how patient tolerates the medication discussed a slow taper and see back again by myself or her primary care provider in 3-4 weeks

## 2011-07-19 ENCOUNTER — Encounter: Payer: Self-pay | Admitting: Family Medicine

## 2011-07-19 ENCOUNTER — Ambulatory Visit: Payer: Medicaid Other | Admitting: Family Medicine

## 2011-07-19 ENCOUNTER — Ambulatory Visit (INDEPENDENT_AMBULATORY_CARE_PROVIDER_SITE_OTHER): Payer: Medicaid Other | Admitting: Family Medicine

## 2011-07-19 VITALS — BP 120/86 | HR 104 | Temp 98.0°F | Ht 73.0 in | Wt >= 6400 oz

## 2011-07-19 DIAGNOSIS — F339 Major depressive disorder, recurrent, unspecified: Secondary | ICD-10-CM

## 2011-07-19 DIAGNOSIS — F4321 Adjustment disorder with depressed mood: Secondary | ICD-10-CM

## 2011-07-19 MED ORDER — LORAZEPAM 1 MG PO TABS: 2.0000 mg | ORAL_TABLET | Freq: Three times a day (TID) | ORAL | Status: AC | PRN

## 2011-07-19 NOTE — Progress Notes (Signed)
  Subjective:    Patient ID: Karen Brennan, female    DOB: 1976/05/01, 36 y.o.   MRN: [id number]  HPI 1. Grief state Patient's 71 y/o father is on life-support in [place] s/p massive stroke. He is most likely brain dead and they are planning on stopping life-support. Patient is grieving his condition and is very anxious and stressed. She is requesting medication to help with her emotional state at this time.   2. Bipolar disorder Patient has bipolar disorder and is on a number of psychiatric medications, reviewed. See med. List. Patient denies mania or extreme depression. She is going through grief at this time.   Review of Systems Pertinent items are noted in HPI.      Objective:   Physical Exam Filed Vitals:   07/19/11 1336  BP: 120/86  Pulse: 104  Temp: 98 F (36.7 C)  TempSrc: Oral  Height: 6\' 1"  (1.854 m)  Weight: 462 lb 11.2 oz (209.879 kg)  Mood: sad, crying Thought: normal thoughts, no suicidal/homicidal. Has safety net. Hallucinations: none.     Assessment & Plan:

## 2011-07-19 NOTE — Assessment & Plan Note (Signed)
Will give short course of ativan for her grief state. Advised her that this will take time and talked about stages of grief. No suicidal thoughts, good support network.

## 2011-07-19 NOTE — Patient Instructions (Signed)
It was great to see you today!  Schedule an appointment to see me as needed.  Im very sorry to hear about your fathers health.  I suggest you take some time off and focus on grieving.

## 2011-07-19 NOTE — Assessment & Plan Note (Signed)
Patient states she is bipolar and used to go to [facility]. This has closed. She will be referred to psychiatry to manage her medications.

## 2011-07-20 ENCOUNTER — Telehealth: Payer: Self-pay | Admitting: *Deleted

## 2011-07-20 NOTE — Telephone Encounter (Signed)
MD requested referrall to psychiatry. I contacted Randon Goldsmith and spoke with [employer]. He advises that they have a walk in policy Monday - Friday 8:00 AM to 3:00 PM. Patient  will be set up with a psychiatrist at that time. Message left on voicemail for patient to call back for these details.

## 2011-07-21 NOTE — Telephone Encounter (Signed)
Patient notified and given details.  She voices understanding.

## 2011-07-25 ENCOUNTER — Other Ambulatory Visit: Payer: Self-pay | Admitting: Family Medicine

## 2011-07-25 NOTE — Telephone Encounter (Signed)
Refill request

## 2011-08-06 ENCOUNTER — Encounter (HOSPITAL_COMMUNITY): Payer: Self-pay | Admitting: Emergency Medicine

## 2011-08-06 ENCOUNTER — Emergency Department (HOSPITAL_COMMUNITY)
Admission: EM | Admit: 2011-08-06 | Discharge: 2011-08-06 | Disposition: A | Discharge status: Home / Self Care | Payer: Medicaid Other | Attending: Emergency Medicine | Admitting: Emergency Medicine

## 2011-08-06 ENCOUNTER — Telehealth: Payer: Self-pay | Admitting: Family Medicine

## 2011-08-06 DIAGNOSIS — J329 Chronic sinusitis, unspecified: Secondary | ICD-10-CM | POA: Insufficient documentation

## 2011-08-06 DIAGNOSIS — Z79899 Other long term (current) drug therapy: Secondary | ICD-10-CM | POA: Insufficient documentation

## 2011-08-06 DIAGNOSIS — R51 Headache: Secondary | ICD-10-CM | POA: Insufficient documentation

## 2011-08-06 DIAGNOSIS — J069 Acute upper respiratory infection, unspecified: Secondary | ICD-10-CM

## 2011-08-06 DIAGNOSIS — I10 Essential (primary) hypertension: Secondary | ICD-10-CM | POA: Insufficient documentation

## 2011-08-06 HISTORY — DX: Essential (primary) hypertension: I10

## 2011-08-06 MED ORDER — NAPROXEN 500 MG PO TABS: 500.0000 mg | ORAL_TABLET | Freq: Two times a day (BID) | ORAL | Status: DC

## 2011-08-06 MED ORDER — BENZONATATE 200 MG PO CAPS: 200.0000 mg | ORAL_CAPSULE | Freq: Three times a day (TID) | ORAL | Status: DC | PRN

## 2011-08-06 MED ORDER — AZITHROMYCIN 250 MG PO TABS: 250.0000 mg | ORAL_TABLET | Freq: Every day | ORAL | Status: DC

## 2011-08-06 NOTE — ED Provider Notes (Signed)
History     CSN: [id number]  Arrival date & time 08/06/11  6213   First MD Initiated Contact with Patient 08/06/11 0535      Chief Complaint  Patient presents with  . Cough  . Generalized Body Aches    (Consider location/radiation/quality/duration/timing/severity/associated sxs/prior treatment) HPI Comments: 36 year old female with a history of morbid obesity, hypertension who presents with approximately one week of upper respiratory symptoms including cough, cold, sinus drainage, sore throat. Symptoms are persistent, gradually getting worse, nothing makes it better or worse and it is not associated with fevers chills nausea or vomiting.  Patient is a 36 y.o. female presenting with cough. The history is provided by the patient.  Cough Associated symptoms include headaches, rhinorrhea and sore throat. Pertinent negatives include no chills, no ear pain and no eye redness.    Past Medical History  Diagnosis Date  . Hypertension     History reviewed. No pertinent past surgical history.  History reviewed. No pertinent family history.  History  Substance Use Topics  . Smoking status: Never Smoker   . Smokeless tobacco: Former Neurosurgeon    Quit date: 05/17/2004  . Alcohol Use: No    OB History    Grav Para Term Preterm Abortions TAB SAB Ect Mult Living                  Review of Systems  Constitutional: Negative for fever and chills.  HENT: Positive for congestion, sore throat, rhinorrhea, sneezing and sinus pressure. Negative for ear pain.   Eyes: Negative for discharge and redness.  Respiratory: Positive for cough.   Gastrointestinal: Negative for nausea, vomiting and diarrhea.  Genitourinary: Negative for dysuria.  Musculoskeletal: Negative for back pain and joint swelling.  Skin: Negative for rash.  Neurological: Positive for headaches.  Hematological: Negative for adenopathy.    Allergies  Sibutramine  Home Medications   Current Outpatient Rx  Name Route Sig  Dispense Refill  . ARIPIPRAZOLE 5 MG PO TABS Oral Take 5 mg by mouth daily.    Marland Kitchen CETIRIZINE HCL 10 MG PO CAPS Oral Take 1 capsule (10 mg total) by mouth at bedtime. 30 capsule 11  . CITALOPRAM HYDROBROMIDE 20 MG PO TABS Oral Take 1 tablet (20 mg total) by mouth daily. 30 tablet 5  . CLONAZEPAM 2 MG PO TABS Oral Take 1 tablet (2 mg total) by mouth 3 (three) times daily as needed for anxiety. For anxiety 90 tablet 2  . CYCLOBENZAPRINE HCL 5 MG PO TABS Oral Take 1 tablet (5 mg total) by mouth 3 (three) times daily as needed for muscle spasms. 90 tablet 0  . GABAPENTIN 100 MG PO CAPS  Take 3 capsules (300 mg total) by mouth at bedtime. 90 capsule 6  . HYDROCHLOROTHIAZIDE 25 MG PO TABS  TAKE 1 TABLET BY MOUTH EVERY MORNING 31 tablet 11  . KETOCONAZOLE 2 % EX CREA  APPLY TOPICALLY 2 TIMES DAILY. APPLY TO NOSTRILS. 60 g 1  . KETOCONAZOLE 2 % EX SHAM  APPLY TO SCALP 4 TIMES A WEEK 120 mL 3  . LISINOPRIL 5 MG PO TABS  TAKE ONE TABLET BY MOUTH DAILY 90 tablet 0  . OMEPRAZOLE 20 MG PO TBEC Oral Take 20 mg by mouth daily.      Marland Kitchen OMEPRAZOLE 20 MG PO CPDR  TAKE 1 CAPSULE BY MOUTH DAILY 30 capsule 11  . POLYETHYLENE GLYCOL 3350 PO POWD  MIX 1 CAPFUL (17 GM) IN 4-8 OUNCES OF LIQUID AS DIRECTED 527  g 1  . PROMETHAZINE HCL 12.5 MG PO TABS Oral Take 12.5 mg by mouth as needed. nausea    . QUETIAPINE FUMARATE 25 MG PO TABS Oral Take 2 tablets (50 mg total) by mouth at bedtime. 60 tablet 3  . SENNOSIDES 8.6 MG PO TABS Oral Take 2 tablets by mouth daily.      Marland Kitchen SIMETHICONE 80 MG PO CHEW Oral Chew 80 mg by mouth daily. 1 tab up to 4 times a day for gas relief     . TOPIRAMATE 50 MG PO TABS  TAKE ONE TABLET BY MOUTH TWICE DAILY 64 tablet 5  . AZITHROMYCIN 250 MG PO TABS Oral Take 1 tablet (250 mg total) by mouth daily. 500mg  PO day 1, then 250mg  PO days 205 6 tablet 0  . BENZONATATE 200 MG PO CAPS Oral Take 1 capsule (200 mg total) by mouth 3 (three) times daily as needed for cough. 20 capsule 0  . NAPROXEN 500 MG PO  TABS Oral Take 1 tablet (500 mg total) by mouth 2 (two) times daily with a meal. 30 tablet 0    BP 140/83  Pulse 96  Temp(Src) 97.5 F (36.4 C) (Oral)  Resp 18  SpO2 98%  Physical Exam  Nursing note and vitals reviewed. Constitutional: She appears well-developed and well-nourished. No distress.  HENT:  Head: Normocephalic and atraumatic.  Mouth/Throat: Oropharynx is clear and moist. No oropharyngeal exudate.       Normal appearing tympanic membranes bilaterally, oropharynx is clear, mucous memory is moist, turbinates are swollen bilaterally with mild discharge  Eyes: Conjunctivae are normal. No scleral icterus.  Neck: Normal range of motion. Neck supple. No thyromegaly present.  Cardiovascular: Normal rate and regular rhythm.   Pulmonary/Chest: Effort normal and breath sounds normal.  Lymphadenopathy:    She has no cervical adenopathy.  Neurological: She is alert.  Skin: Skin is warm and dry. No rash noted. She is not diaphoretic.    ED Course  Procedures (including critical care time)  Labs Reviewed - No data to display No results found.   1. URI (upper respiratory infection)   2. Sinusitis       MDM  Well-appearing, vital signs reveal oxygen 98%, no respiratory distress and clear lung sounds. We'll treat for sinusitis, likely upper respiratory tract infection  Discharge Prescriptions include:  #1 Zithromax #2 Naprosyn #3 Tessalon        Vida Roller, MD 08/06/11 [phone]

## 2011-08-06 NOTE — Telephone Encounter (Signed)
Sick for 3-4 days with cold symptoms. Sneezing, runny, nose. When she sneezes she hurts in her chest and back. Wants to know if this is normal. Taking tylenol around the clock. Denies fever.  Pain is very bad. Comes only with sneezing.  Recommendations: go to ED if pain is severe and she feels she needs to be seen right away, pain worsening, or pain associated with SOB. Wait and go to urgent care if she does not feel that pain is emergent.  Tylenol, only 1 gm in 4 hrs or 4 gm in 24 hrs. May alternate with motrin if able to take motrin.

## 2011-08-06 NOTE — Discharge Instructions (Signed)
Take the medication exactly as prescribed and return to see your doctor as needed, return to the emergency department for severe worsening pain, fever, severe difficulty breathing.

## 2011-08-06 NOTE — ED Notes (Addendum)
Patient has multiple cold/flu complaints that started a week ago (productive cough, chest congestion, body aches, chills, sneezing, nausea, stuffy nose, headache, and sore throat).  Patient states that her rib cages "aches" when she coughs; patient actively blowing nose.  Patient had two of her children seen in the Pediatric department tonight for similar symptoms (one was diagnosed with strep throat; the other with an upper respiratory tract infection).  Patient has been taking Tylenol and Benadryl.

## 2011-08-10 ENCOUNTER — Telehealth: Payer: Self-pay | Admitting: *Deleted

## 2011-08-10 ENCOUNTER — Encounter: Payer: Self-pay | Admitting: *Deleted

## 2011-08-10 NOTE — Telephone Encounter (Signed)
This encounter was created in error - please disregard.

## 2011-08-10 NOTE — Telephone Encounter (Deleted)
Pharmacy calling to request refills of citalopram 20mg , Seroquel, 25mg , Abilify 5mg , cyclobenzaprine 5mg , clonazepam 2mg , ketoconazole shampoo, and polyethe

## 2011-08-10 NOTE — Telephone Encounter (Signed)
Patient called yesterday late asking for refills of 7 meds be sent to [facility].  Called [facility]  and they faxed over the meds she needs refilled. Patient notified that Dr. Rivka Safer will be in clinic tomorrow AM and will address. Patient voices understanding and is agreeable to this plan. Meds needed:   Klonopin   Citalapram   Quetiapine fumarate   Abilify   Flexeril   Polyethylene glycol powder   Ketoconazole shampoo  requests placed in MD box to fax back.

## 2011-08-11 ENCOUNTER — Other Ambulatory Visit: Payer: Self-pay | Admitting: Family Medicine

## 2011-08-11 MED ORDER — CITALOPRAM HYDROBROMIDE 20 MG PO TABS: 20.0000 mg | ORAL_TABLET | Freq: Every day | ORAL | Status: DC

## 2011-08-11 MED ORDER — OMEPRAZOLE 20 MG PO CPDR: 20.0000 mg | DELAYED_RELEASE_CAPSULE | Freq: Every day | ORAL | Status: DC

## 2011-08-11 MED ORDER — KETOCONAZOLE 2 % EX SHAM: MEDICATED_SHAMPOO | CUTANEOUS | Status: DC

## 2011-08-11 MED ORDER — ARIPIPRAZOLE 5 MG PO TABS: 10.0000 mg | ORAL_TABLET | Freq: Every day | ORAL | Status: DC

## 2011-08-11 MED ORDER — POLYETHYLENE GLYCOL 3350 17 GM/SCOOP PO POWD: 17.0000 g | Freq: Every day | ORAL | Status: DC

## 2011-08-11 NOTE — Telephone Encounter (Signed)
Appointment scheduled 03/13.

## 2011-08-11 NOTE — Telephone Encounter (Signed)
Refilled medications requested. however I did not refill the clonazepam or the flexeril. She should schedule an appointment with me if she would like to discuss this. She should also see her psychiatrist for her psychiatric medications, that I will fill in the mean time.

## 2011-08-23 ENCOUNTER — Ambulatory Visit: Payer: Medicaid Other | Admitting: Family Medicine

## 2011-09-15 ENCOUNTER — Encounter: Payer: Self-pay | Admitting: Family Medicine

## 2011-09-15 ENCOUNTER — Ambulatory Visit (INDEPENDENT_AMBULATORY_CARE_PROVIDER_SITE_OTHER): Payer: Medicaid Other | Admitting: Family Medicine

## 2011-09-15 VITALS — BP 128/88 | HR 100 | Temp 98.7°F | Ht 73.0 in | Wt >= 6400 oz

## 2011-09-15 DIAGNOSIS — Z131 Encounter for screening for diabetes mellitus: Secondary | ICD-10-CM

## 2011-09-15 DIAGNOSIS — E669 Obesity, unspecified: Secondary | ICD-10-CM

## 2011-09-15 DIAGNOSIS — Z0000 Encounter for general adult medical examination without abnormal findings: Secondary | ICD-10-CM

## 2011-09-15 DIAGNOSIS — F3160 Bipolar disorder, current episode mixed, unspecified: Secondary | ICD-10-CM | POA: Insufficient documentation

## 2011-09-15 LAB — LIPID PANEL
Cholesterol: 185 mg/dL (ref 0–200)
HDL: 48 mg/dL (ref >39)
Total CHOL/HDL Ratio: 3.9 Ratio

## 2011-09-15 LAB — POCT GLYCOSYLATED HEMOGLOBIN (HGB A1C): Hemoglobin A1C: 5.7

## 2011-09-15 MED ORDER — LORATADINE 10 MG PO TABS: 10.0000 mg | ORAL_TABLET | Freq: Every day | ORAL | Status: DC

## 2011-09-15 MED ORDER — CLONAZEPAM 0.5 MG PO TABS: 0.5000 mg | ORAL_TABLET | Freq: Two times a day (BID) | ORAL | Status: DC | PRN

## 2011-09-15 MED ORDER — FLUTICASONE PROPIONATE 50 MCG/ACT NA SUSP: 2.0000 | Freq: Every day | NASAL | Status: DC

## 2011-09-15 NOTE — Progress Notes (Deleted)
Subjective:     Karen Brennan is a 36 y.o. female and is here for a comprehensive physical exam. The patient reports {problems:16946}.  History   Social History  . Marital Status: Single    Spouse Name: N/A    Number of Children: N/A  . Years of Education: N/A   Occupational History  . Not on file.   Social History Main Topics  . Smoking status: Never Smoker   . Smokeless tobacco: Former Neurosurgeon    Quit date: 05/17/2004  . Alcohol Use: No  . Drug Use: No  . Sexually Active: No   Other Topics Concern  . Not on file   Social History Narrative  . No narrative on file   Health Maintenance  Topic Date Due  . Influenza Vaccine  03/12/2012  . Pap Smear  08/15/2013  . Tetanus/tdap  10/10/2013    {Common ambulatory SmartLinks:19316}  Review of Systems {ros; complete:30496}   Objective:    {Exam, [phone]}    Assessment:    Healthy female exam. ***     Plan:     See After Visit Summary for Counseling Recommendations   Subjective:     Karen Brennan is a 36 y.o. female and is here for a comprehensive physical exam. The patient reports problems - headache, anxiety, obesity.   Headaches: random. No caffeine/nsaids. No aura, no photophobia, no n/v. She has irregular sleeping patterns. She has a lot of stress in her life. Was on topamax without help. She suffers from chronic sinusitis and seasonal allergies.   History   Social History  . Marital Status: Single    Spouse Name: N/A    Number of Children: N/A  . Years of Education: N/A   Occupational History  . Not on file.   Social History Main Topics  . Smoking status: Never Smoker   . Smokeless tobacco: Former Neurosurgeon    Quit date: 05/17/2004  . Alcohol Use: No  . Drug Use: No  . Sexually Active: No   Other Topics Concern  . Not on file   Social History Narrative  . No narrative on file   Health Maintenance  Topic Date Due  . Influenza Vaccine  03/12/2012  . Pap Smear  08/15/2013  .  Tetanus/tdap  10/10/2013    The following portions of the patient's history were reviewed and updated as appropriate: allergies, current medications, past family history, past medical history, past social history, past surgical history and problem list.  Review of Systems Pertinent items are noted in HPI.   Objective:   Filed Vitals:   09/15/11 1057  BP: 128/88  Pulse: 100  Temp: 98.7 F (37.1 C)  TempSrc: Oral  Height: 6\' 1"  (1.854 m)  Weight: 470 lb (213.191 kg)  General: AAF, morbidly obese, pleasant.  Lungs:  Normal respiratory effort, chest expands symmetrically. Lungs are clear to auscultation, no crackles or wheezes. Heart - Regular rate and rhythm.  No murmurs, gallops or rubs.    Abdomen: soft and non-tender without masses, organomegaly or hernias noted.  No guarding or rebound Skin:  Intact without suspicious lesions or rashes Neck:  No deformities, thyromegaly, masses, or tenderness noted.   Supple with full range of motion without pain. Extremities:   Non-tender, No cyanosis, edema, or deformity noted.  Back - Normal skin, Spine with normal alignment and no deformity.  No tenderness to vertebral process palpation.  Paraspinous muscles are not tender and without spasm.   Range of motion  is full at neck and lumbar sacral regions  Assessment:   Morbidly obese 36 y/o female with bipolar disorder c/o headaches.   I believe her headaches are sinus related. I have prescribed her medicine for allergies and nasal congestion.    Plan:     See After Visit Summary for Counseling Recommendations

## 2011-09-15 NOTE — Progress Notes (Signed)
Pt stated that she has been having a headache for the past week. She is taking topamax and tylenol with no changes. Pt states that it starts at the front of her head and radiates back down into her neck. She has been a little stressed (pt's father passed away in 2022/08/19. 11-18-2011)

## 2011-09-15 NOTE — Progress Notes (Signed)
Addended by: Edd Arbour on: 09/15/2011 11:48 AM   Modules accepted: Kipp Brood

## 2011-09-15 NOTE — Progress Notes (Signed)
  Subjective:     Karen Brennan is a 36 y.o. female and is here for a comprehensive physical exam. The patient reports no problems.  History   Social History  . Marital Status: Single    Spouse Name: N/A    Number of Children: N/A  . Years of Education: N/A   Occupational History  . Not on file.   Social History Main Topics  . Smoking status: Never Smoker   . Smokeless tobacco: Former Neurosurgeon    Quit date: 05/17/2004  . Alcohol Use: No  . Drug Use: No  . Sexually Active: No   Other Topics Concern  . Not on file   Social History Narrative  . No narrative on file   Health Maintenance  Topic Date Due  . Influenza Vaccine  03/12/2012  . Pap Smear  08/15/2013  . Tetanus/tdap  10/10/2013    The following portions of the patient's history were reviewed and updated as appropriate: allergies, current medications, past family history, past medical history, past social history, past surgical history and problem list.  Review of Systems Pertinent items are noted in HPI.   Objective:  Lungs:  Normal respiratory effort, chest expands symmetrically. Lungs are clear to auscultation, no crackles or wheezes. Heart - Regular rate and rhythm.  No murmurs, gallops or rubs.    Abdomen: soft and non-tender without masses, organomegaly or hernias noted.  No guarding or rebound Skin:  Intact without suspicious lesions or rashes Extremities:   Non-tender, No cyanosis, edema, or deformity noted.  Back - Normal skin, Spine with normal alignment and no deformity.  No tenderness to vertebral process palpation.  Paraspinous muscles are not tender and without spasm.   Range of motion is full at neck and lumbar sacral regions Throat: normal mucosa, no exudate, uvula midline, no redness Nose:  External nasal examination shows no deformity or inflammation. Nasal mucosa are pink and moist without lesions or exudates. No septal dislocation or dislocation.No obstruction to airflow. Neck:  No deformities,  thyromegaly, masses, or tenderness noted.   Supple with full range of motion without pain.   Assessment:    Morbidly obese AAF.   Plan:     See After Visit Summary for Counseling Recommendations

## 2011-09-15 NOTE — Patient Instructions (Addendum)
It was great to see you today!  Schedule an appointment to see me as needed.  

## 2011-09-18 ENCOUNTER — Encounter: Payer: Self-pay | Admitting: Family Medicine

## 2011-10-05 ENCOUNTER — Encounter (HOSPITAL_COMMUNITY): Payer: Self-pay | Admitting: Emergency Medicine

## 2011-10-05 ENCOUNTER — Emergency Department (HOSPITAL_COMMUNITY)
Admission: EM | Admit: 2011-10-05 | Discharge: 2011-10-05 | Disposition: A | Discharge status: Home / Self Care | Payer: Medicaid Other | Attending: Emergency Medicine | Admitting: Emergency Medicine

## 2011-10-05 DIAGNOSIS — N390 Urinary tract infection, site not specified: Secondary | ICD-10-CM | POA: Insufficient documentation

## 2011-10-05 DIAGNOSIS — Z79899 Other long term (current) drug therapy: Secondary | ICD-10-CM | POA: Insufficient documentation

## 2011-10-05 DIAGNOSIS — D72829 Elevated white blood cell count, unspecified: Secondary | ICD-10-CM | POA: Insufficient documentation

## 2011-10-05 DIAGNOSIS — R110 Nausea: Secondary | ICD-10-CM | POA: Insufficient documentation

## 2011-10-05 DIAGNOSIS — I10 Essential (primary) hypertension: Secondary | ICD-10-CM | POA: Insufficient documentation

## 2011-10-05 DIAGNOSIS — F319 Bipolar disorder, unspecified: Secondary | ICD-10-CM | POA: Insufficient documentation

## 2011-10-05 HISTORY — DX: Bipolar disorder, unspecified: F31.9

## 2011-10-05 LAB — DIFFERENTIAL
Basophils Relative: 0 % (ref 0–1)
Eosinophils Absolute: 0.2 10*3/uL (ref 0.0–0.7)
Monocytes Relative: 5 % (ref 3–12)
Neutrophils Relative %: 44 % (ref 43–77)

## 2011-10-05 LAB — URINALYSIS, ROUTINE W REFLEX MICROSCOPIC
Bilirubin Urine: NEGATIVE
Glucose, UA: NEGATIVE mg/dL
Ketones, ur: NEGATIVE mg/dL
Protein, ur: NEGATIVE mg/dL

## 2011-10-05 LAB — URINE MICROSCOPIC-ADD ON

## 2011-10-05 LAB — CBC
Hemoglobin: 14.6 g/dL (ref 12.0–15.0)
MCH: 29.6 pg (ref 26.0–34.0)
MCHC: 33.8 g/dL (ref 30.0–36.0)
Platelets: 336 10*3/uL (ref 150–400)

## 2011-10-05 MED ORDER — NITROFURANTOIN MONOHYD MACRO 100 MG PO CAPS: 100.0000 mg | ORAL_CAPSULE | Freq: Two times a day (BID) | ORAL | Status: DC

## 2011-10-05 MED ORDER — CEFTRIAXONE SODIUM 1 G IJ SOLR
INTRAMUSCULAR | Status: AC
  Filled 2011-10-05: qty 10

## 2011-10-05 MED ORDER — ONDANSETRON 4 MG PO TBDP
4.0000 mg | ORAL_TABLET | Freq: Three times a day (TID) | ORAL | Status: DC | PRN
Start: 2011-10-05 — End: 2011-10-09

## 2011-10-05 MED ORDER — LIDOCAINE HCL (PF) 1 % IJ SOLN
INTRAMUSCULAR | Status: AC
  Administered 2011-10-05: 2.1 mL
  Filled 2011-10-05: qty 5

## 2011-10-05 MED ORDER — SODIUM CHLORIDE 0.9 % IV BOLUS (SEPSIS)
1000.0000 mL | Freq: Once | INTRAVENOUS | Status: AC
  Administered 2011-10-05: 1000 mL via INTRAVENOUS

## 2011-10-05 MED ORDER — DEXTROSE 5 % IV SOLN: 1.0000 g | Freq: Once | INTRAVENOUS | Status: DC

## 2011-10-05 MED ORDER — CEFTRIAXONE SODIUM 1 G IJ SOLR
1.0000 g | Freq: Once | INTRAMUSCULAR | Status: AC
  Administered 2011-10-05: 1 g via INTRAMUSCULAR

## 2011-10-05 MED ORDER — ONDANSETRON HCL 4 MG/2ML IJ SOLN
4.0000 mg | Freq: Once | INTRAMUSCULAR | Status: AC
  Administered 2011-10-05: 4 mg via INTRAVENOUS
  Filled 2011-10-05: qty 2

## 2011-10-05 NOTE — ED Notes (Signed)
Pt reports nausea x 2 days ago. No vomiting. Also reports dizziness when nausea goes away. Pt feels like she is going to pass out. Reports appetite is lessened

## 2011-10-05 NOTE — Discharge Instructions (Signed)
Nausea, Adult Nausea is the feeling that you have an upset stomach or have to vomit. Nausea by itself is not likely a serious concern, but it may be an early sign of more serious medical problems. As nausea gets worse, it can lead to vomiting. If vomiting develops, there is the risk of dehydration.  CAUSES   Viral infections.   Food poisoning.   Medicines.   Pregnancy.   Motion sickness.   Migraine headaches.   Emotional distress.   Severe pain from any source.   Alcohol intoxication.  HOME CARE INSTRUCTIONS  Get plenty of rest.   Ask your caregiver about specific rehydration instructions.   Eat small amounts of food and sip liquids more often.   Take all medicines as told by your caregiver.  SEEK MEDICAL CARE IF:  You have not improved after 2 days, or you get worse.   You have a headache.  SEEK IMMEDIATE MEDICAL CARE IF:   You have a fever.   You faint.   You keep vomiting or have blood in your vomit.   You are extremely weak or dehydrated.   You have dark or bloody stools.   You have severe chest or abdominal pain.  MAKE SURE YOU:  Understand these instructions.   Will watch your condition.   Will get help right away if you are not doing well or get worse.  Document Released: 07/06/2004 Document Revised: 05/18/2011 Document Reviewed: 02/08/2011 Exit[employer] Patient Information 2012 [employer].Urinary Tract Infection Infections of the urinary tract can start in several places. A bladder infection (cystitis), a kidney infection (pyelonephritis), and a prostate infection (prostatitis) are different types of urinary tract infections (UTIs). They usually get better if treated with medicines (antibiotics) that kill germs. Take all the medicine until it is gone. You or your child may feel better in a few days, but TAKE ALL MEDICINE or the infection may not respond and may become more difficult to treat. HOME CARE INSTRUCTIONS   Drink enough water and fluids to  keep the urine clear or pale yellow. Cranberry juice is especially recommended, in addition to large amounts of water.   Avoid caffeine, tea, and carbonated beverages. They tend to irritate the bladder.   Alcohol may irritate the prostate.   Only take over-the-counter or prescription medicines for pain, discomfort, or fever as directed by your caregiver.  To prevent further infections:  Empty the bladder often. Avoid holding urine for long periods of time.   After a bowel movement, women should cleanse from front to back. Use each tissue only once.   Empty the bladder before and after sexual intercourse.  FINDING OUT THE RESULTS OF YOUR TEST Not all test results are available during your visit. If your or your child's test results are not back during the visit, make an appointment with your caregiver to find out the results. Do not assume everything is normal if you have not heard from your caregiver or the medical facility. It is important for you to follow up on all test results. SEEK MEDICAL CARE IF:   There is back pain.   Your baby is older than 3 months with a rectal temperature of 100.5 F (38.1 C) or higher for more than 1 day.   Your or your child's problems (symptoms) are no better in 3 days. Return sooner if you or your child is getting worse.  SEEK IMMEDIATE MEDICAL CARE IF:   There is severe back pain or lower abdominal pain.     You or your child develops chills.   You have a fever.   Your baby is older than 3 months with a rectal temperature of 102 F (38.9 C) or higher.   Your baby is 3 months old or younger with a rectal temperature of 100.4 F (38 C) or higher.   There is nausea or vomiting.   There is continued burning or discomfort with urination.  MAKE SURE YOU:   Understand these instructions.   Will watch your condition.   Will get help right away if you are not doing well or get worse.  Document Released: 03/08/2005 Document Revised: 05/18/2011  Document Reviewed: 10/11/2006 Exit[employer] Patient Information 2012 [employer]. 

## 2011-10-05 NOTE — ED Notes (Signed)
Pt informed urine needed

## 2011-10-05 NOTE — ED Provider Notes (Addendum)
History     CSN: [id number]  Arrival date & time 10/05/11  2017   First MD Initiated Contact with Patient 10/05/11 2100      Chief Complaint  Patient presents with  . Nausea     HPI Pt reports nausea x 2 days ago. No vomiting. Also reports dizziness when nausea goes away. Pt feels like she is going to pass out. Reports appetite is lessened  Past Medical History  Diagnosis Date  . Hypertension   . ANEMIA 08/14/2008    Qualifier: Diagnosis of  By: Karn Pickler MD, Shanda Bumps    . Bipolar 1 disorder     No past surgical history on file.  No family history on file.  History  Substance Use Topics  . Smoking status: Never Smoker   . Smokeless tobacco: Former Neurosurgeon    Quit date: 05/17/2004  . Alcohol Use: No    OB History    Grav Para Term Preterm Abortions TAB SAB Ect Mult Living                  Review of Systems All other systems are negative Allergies  Meridia and Sibutramine  Home Medications   Current Outpatient Rx  Name Route Sig Dispense Refill  . ARIPIPRAZOLE 5 MG PO TABS Oral Take 2 tablets (10 mg total) by mouth daily. 30 tablet 6  . CITALOPRAM HYDROBROMIDE 20 MG PO TABS Oral Take 1 tablet (20 mg total) by mouth daily. 30 tablet 6  . CLONAZEPAM 0.5 MG PO TABS Oral Take 1 tablet (0.5 mg total) by mouth 2 (two) times daily as needed for anxiety. 30 tablet 0  . FLUTICASONE PROPIONATE 50 MCG/ACT NA SUSP Nasal Place 2 sprays into the nose daily. 16 g 6  . GABAPENTIN 100 MG PO CAPS  Take 3 capsules (300 mg total) by mouth at bedtime. 90 capsule 6  . HYDROCHLOROTHIAZIDE 25 MG PO TABS  TAKE 1 TABLET BY MOUTH EVERY MORNING 31 tablet 11  . LISINOPRIL 5 MG PO TABS  TAKE ONE TABLET BY MOUTH DAILY 90 tablet 0  . LORATADINE 10 MG PO TABS Oral Take 1 tablet (10 mg total) by mouth daily. 30 tablet 5  . OMEPRAZOLE 20 MG PO CPDR Oral Take 1 capsule (20 mg total) by mouth daily. 30 capsule 11  . POLYETHYLENE GLYCOL 3350 PO POWD Oral Take 17 g by mouth daily. 527 g 6  . QUETIAPINE  FUMARATE 25 MG PO TABS Oral Take 2 tablets (50 mg total) by mouth at bedtime. 60 tablet 3  . NITROFURANTOIN MONOHYD MACRO 100 MG PO CAPS Oral Take 1 capsule (100 mg total) by mouth 2 (two) times daily. 10 capsule 0  . ONDANSETRON 4 MG PO TBDP Oral Take 1 tablet (4 mg total) by mouth every 8 (eight) hours as needed for nausea. 20 tablet 0    BP 126/95  Pulse 116  Temp(Src) 98.2 F (36.8 C) (Oral)  Resp 22  SpO2 99%  Physical Exam  Nursing note and vitals reviewed. Constitutional: She is oriented to person, place, and time. She appears well-developed and well-nourished. No distress.       Morbidly obese  HENT:  Head: Normocephalic and atraumatic.  Eyes: Pupils are equal, round, and reactive to light.  Neck: Normal range of motion.  Cardiovascular: Normal rate and intact distal pulses.   Pulmonary/Chest: No respiratory distress.  Abdominal: Normal appearance. She exhibits no distension.  Musculoskeletal: Normal range of motion.  Neurological: She is alert and  oriented to person, place, and time. No cranial nerve deficit.  Skin: Skin is warm and dry. No rash noted. She is not diaphoretic.  Psychiatric: She has a normal mood and affect. Her behavior is normal.    ED Course  Procedures (including critical care time) Scheduled Meds:    . cefTRIAXone (ROCEPHIN)  IV  1 g Intravenous Once  . ondansetron  4 mg Intravenous Once  . sodium chloride  1,000 mL Intravenous Once   Continuous Infusions:  PRN Meds:.  Labs Reviewed  GLUCOSE, CAPILLARY - Abnormal; Notable for the following:    Glucose-Capillary 109 (*)    All other components within normal limits  CBC - Abnormal; Notable for the following:    WBC 11.7 (*)    All other components within normal limits  DIFFERENTIAL - Abnormal; Notable for the following:    Lymphocytes Relative 49 (*)    Lymphs Abs 5.8 (*)    All other components within normal limits  URINALYSIS, ROUTINE W REFLEX MICROSCOPIC - Abnormal; Notable for the  following:    APPearance CLOUDY (*)    Leukocytes, UA SMALL (*)    All other components within normal limits  URINE MICROSCOPIC-ADD ON - Abnormal; Notable for the following:    Squamous Epithelial / LPF MANY (*)    Bacteria, UA FEW (*)    All other components within normal limits  LIPASE, BLOOD   No results found.   1. UTI (lower urinary tract infection)   2. Nausea   3. Leukocytosis       MDM   Patient states she is feeling much better after treatment.      Nelia Shi, MD 10/05/11 9147  Nelia Shi, MD 11/29/11 [phone]

## 2011-10-05 NOTE — ED Notes (Signed)
Pt reports feeling better with decrease in nausea at this time. Pt has had no observed or reported v/d at this time. Family at bedside, plan of care is updated with verbal understanding. MD will be updated on pt status with MD to follow up for disposition. Will continue to monitor pt.

## 2011-10-06 ENCOUNTER — Emergency Department (INDEPENDENT_AMBULATORY_CARE_PROVIDER_SITE_OTHER): Payer: Medicaid Other

## 2011-10-06 ENCOUNTER — Emergency Department (INDEPENDENT_AMBULATORY_CARE_PROVIDER_SITE_OTHER)
Admission: EM | Admit: 2011-10-06 | Discharge: 2011-10-06 | Disposition: A | Discharge status: Home / Self Care | Payer: Medicaid Other | Source: Home / Self Care | Attending: Emergency Medicine | Admitting: Emergency Medicine

## 2011-10-06 ENCOUNTER — Ambulatory Visit: Payer: Medicaid Other | Admitting: Family Medicine

## 2011-10-06 ENCOUNTER — Encounter (HOSPITAL_COMMUNITY): Payer: Self-pay | Admitting: *Deleted

## 2011-10-06 DIAGNOSIS — J329 Chronic sinusitis, unspecified: Secondary | ICD-10-CM

## 2011-10-06 DIAGNOSIS — R110 Nausea: Secondary | ICD-10-CM

## 2011-10-06 DIAGNOSIS — N200 Calculus of kidney: Secondary | ICD-10-CM

## 2011-10-06 DIAGNOSIS — N390 Urinary tract infection, site not specified: Secondary | ICD-10-CM

## 2011-10-06 LAB — COMPREHENSIVE METABOLIC PANEL
ALT: 37 U/L — ABNORMAL HIGH (ref 0–35)
Alkaline Phosphatase: 57 U/L (ref 39–117)
BUN: 20 mg/dL (ref 6–23)
CO2: 28 mEq/L (ref 19–32)
Chloride: 99 mEq/L (ref 96–112)
GFR calc Af Amer: 74 mL/min — ABNORMAL LOW (ref >90)
Glucose, Bld: 99 mg/dL (ref 70–99)
Potassium: 3.5 mEq/L (ref 3.5–5.1)
Total Bilirubin: 0.4 mg/dL (ref 0.3–1.2)

## 2011-10-06 LAB — DIFFERENTIAL
Lymphocytes Relative: 48 % — ABNORMAL HIGH (ref 12–46)
Lymphs Abs: 4.8 10*3/uL — ABNORMAL HIGH (ref 0.7–4.0)
Monocytes Absolute: 0.6 10*3/uL (ref 0.1–1.0)
Monocytes Relative: 7 % (ref 3–12)
Neutro Abs: 4.3 10*3/uL (ref 1.7–7.7)

## 2011-10-06 LAB — CBC
HCT: 42.1 % (ref 36.0–46.0)
Hemoglobin: 13.7 g/dL (ref 12.0–15.0)
MCHC: 32.5 g/dL (ref 30.0–36.0)
RBC: 4.7 MIL/uL (ref 3.87–5.11)
WBC: 9.9 10*3/uL (ref 4.0–10.5)

## 2011-10-06 LAB — POCT URINALYSIS DIP (DEVICE)
Glucose, UA: NEGATIVE mg/dL
Nitrite: NEGATIVE
Specific Gravity, Urine: 1.025 (ref 1.005–1.030)

## 2011-10-06 LAB — POCT PREGNANCY, URINE: Preg Test, Ur: NEGATIVE

## 2011-10-06 MED ORDER — PROMETHAZINE HCL 25 MG PO TABS: 25.0000 mg | ORAL_TABLET | Freq: Four times a day (QID) | ORAL | Status: DC | PRN

## 2011-10-06 MED ORDER — PROMETHAZINE HCL 25 MG PO TABS: 25.0000 mg | ORAL_TABLET | Freq: Four times a day (QID) | ORAL | Status: AC | PRN

## 2011-10-06 MED ORDER — CEPHALEXIN 500 MG PO CAPS: 500.0000 mg | ORAL_CAPSULE | Freq: Three times a day (TID) | ORAL | Status: DC

## 2011-10-06 MED ORDER — ONDANSETRON HCL 4 MG/2ML IJ SOLN
INTRAMUSCULAR | Status: AC
  Filled 2011-10-06: qty 2

## 2011-10-06 MED ORDER — TAMSULOSIN HCL 0.4 MG PO CAPS: 0.4000 mg | ORAL_CAPSULE | Freq: Every day | ORAL | Status: DC

## 2011-10-06 MED ORDER — ONDANSETRON HCL 4 MG/2ML IJ SOLN
4.0000 mg | Freq: Once | INTRAMUSCULAR | Status: AC
  Administered 2011-10-06: 4 mg via INTRAMUSCULAR

## 2011-10-06 NOTE — ED Notes (Signed)
Reports being treated in ED last night - went in for severe nausea and sinus pressure - was told she has UTI; was given IV fluids, ceftriaxone injection, and started Rxs for Macrobid and Zofran - states sinus problem was not addressed.  C/O continued severe nausea despite using Zofran Rx - last dose @ 1600.  States has not vomited today, but nausea has not improved at all.  C/O continued low back pain.  Denies fevers.  C/O a lot of facial pressure, HA, and nasal congestion.

## 2011-10-06 NOTE — ED Notes (Signed)
Patient in XR dept. 

## 2011-10-06 NOTE — ED Notes (Signed)
Unable to obtain E-signature due to computer problems.

## 2011-10-06 NOTE — ED Notes (Signed)
States nausea has improved some.

## 2011-10-06 NOTE — ED Provider Notes (Signed)
Chief Complaint  Patient presents with  . Nausea  . Sinusitis  . Urinary Tract Infection    History of Present Illness:   The patient is a 36 year old female who has had a two-day history of nasal congestion with yellow green, bloody drainage, sinus pressure, headache, sore throat, chills, and cough. She has a history of allergies in the past. She has used loratadine for that.  She also has had a two-day history of nausea without any vomiting, although she did vomit here in the radiology suite. She denies any abdominal pain, fever, or diarrhea. She went to the emergency room last night and was diagnosed as having a urinary tract infection. She notes some frequency, but denies any dysuria or blood in the urine. She does have some left flank pain. She was given Rocephin and Macrobid. She doesn't feel any better today and still feels extremely nauseated. Her last menstrual period was 5 months ago. 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 She denies any history of ulcer disease or GI problems.  Review of Systems:  Other than noted above, the patient denies any of the following symptoms. Systemic:  No fever, chills, sweats, fatigue, myalgias, headache, or anorexia. Eye:  No redness, pain or drainage. ENT:  No earache, nasal congestion, rhinorrhea, sinus pressure, or sore throat. Lungs:  No cough, sputum production, wheezing, shortness of breath.  Cardiovascular:  No chest pain, palpitations, or syncope. GI:  No nausea, vomiting, abdominal pain or diarrhea. GU:  No dysuria, frequency, or hematuria. Skin:  No rash or pruritis.  PMFSH:  Past medical history, family history, social history, meds, and allergies were reviewed.  Physical Exam:   Vital signs:  BP 123/84  Pulse 102  Temp(Src) 98.4 F (36.9 C) (Oral)  Resp 18  SpO2 100% General:  Alert, she is morbidly obese, she appears in moderate distress due to nausea. Eye:  PERRL, full EOMs.  Lids and conjunctivas were normal. ENT:  TMs and canals  were normal, without erythema or inflammation.  Nasal mucosa was clear and uncongested, without drainage.  Mucous membranes were moist.  Pharynx was clear, without exudate or drainage.  There were no oral ulcerations or lesions. Neck:  Supple, no adenopathy, tenderness or mass. Thyroid was normal. Lungs:  No respiratory distress.  Lungs were clear to auscultation, without wheezes, rales or rhonchi.  Breath sounds were clear and equal bilaterally. Heart:  Regular rhythm, without gallops, murmers or rubs. Abdomen:  Soft, flat, and non-tender to palpation.  No hepatosplenomagaly or mass. Skin:  Clear, warm, and dry, without rash or lesions.  Labs:   Results for orders placed during the hospital encounter of 10/06/11  CBC      Component Value Range   WBC 9.9  4.0 - 10.5 (K/uL)   RBC 4.70  3.87 - 5.11 (MIL/uL)   Hemoglobin 13.7  12.0 - 15.0 (g/dL)   HCT 16.1  09.6 - 04.5 (%)   MCV 89.6  78.0 - 100.0 (fL)   MCH 29.1  26.0 - 34.0 (pg)   MCHC 32.5  30.0 - 36.0 (g/dL)   RDW 40.9  81.1 - 91.4 (%)   Platelets 325  150 - 400 (K/uL)  DIFFERENTIAL      Component Value Range   Neutrophils Relative 43  43 - 77 (%)   Neutro Abs 4.3  1.7 - 7.7 (K/uL)   Lymphocytes Relative 48 (*) 12 - 46 (%)   Lymphs Abs 4.8 (*) 0.7 - 4.0 (K/uL)   Monocytes  Relative 7  3 - 12 (%)   Monocytes Absolute 0.6  0.1 - 1.0 (K/uL)   Eosinophils Relative 2  0 - 5 (%)   Eosinophils Absolute 0.2  0.0 - 0.7 (K/uL)   Basophils Relative 0  0 - 1 (%)   Basophils Absolute 0.0  0.0 - 0.1 (K/uL)  LIPASE, BLOOD      Component Value Range   Lipase 24  11 - 59 (U/L)  COMPREHENSIVE METABOLIC PANEL      Component Value Range   Sodium 138  135 - 145 (mEq/L)   Potassium 3.5  3.5 - 5.1 (mEq/L)   Chloride 99  96 - 112 (mEq/L)   CO2 28  19 - 32 (mEq/L)   Glucose, Bld 99  70 - 99 (mg/dL)   BUN 20  6 - 23 (mg/dL)   Creatinine, Ser 1.19 (*) 0.50 - 1.10 (mg/dL)   Calcium 9.6  8.4 - 14.7 (mg/dL)   Total Protein 8.2  6.0 - 8.3 (g/dL)    Albumin 4.2  3.5 - 5.2 (g/dL)   AST 33  0 - 37 (U/L)   ALT 37 (*) 0 - 35 (U/L)   Alkaline Phosphatase 57  39 - 117 (U/L)   Total Bilirubin 0.4  0.3 - 1.2 (mg/dL)   GFR calc non Af Amer 64 (*) >90 (mL/min)   GFR calc Af Amer 74 (*) >90 (mL/min)  POCT URINALYSIS DIP (DEVICE)      Component Value Range   Glucose, UA NEGATIVE  NEGATIVE (mg/dL)   Bilirubin Urine SMALL (*) NEGATIVE    Ketones, ur TRACE (*) NEGATIVE (mg/dL)   Specific Gravity, Urine 1.025  1.005 - 1.030    Hgb urine dipstick MODERATE (*) NEGATIVE    pH 6.5  5.0 - 8.0    Protein, ur 30 (*) NEGATIVE (mg/dL)   Urobilinogen, UA 1.0  0.0 - 1.0 (mg/dL)   Nitrite NEGATIVE  NEGATIVE    Leukocytes, UA NEGATIVE  NEGATIVE   POCT PREGNANCY, URINE      Component Value Range   Preg Test, Ur NEGATIVE  NEGATIVE      Radiology:  Dg Abd Acute W/chest  10/06/2011  *RADIOLOGY REPORT*  Clinical Data: Nausea for 3 days  ACUTE ABDOMEN SERIES (ABDOMEN 2 VIEW & CHEST 1 VIEW)  Comparison: 03/14/2010  Findings: Normal heart size.  Clear lungs.  No free intraperitoneal gas.  No disproportionate dilatation of bowel.  Unremarkable soft tissues.  Phleboliths project over the pelvis.  Postoperative changes.  Tiny calcification checks over the left renal shadow.  IMPRESSION: Nonobstructive bowel gas pattern.  Possible left nephrolithiasis.  Original Report Authenticated By: Donavan Burnet, M.D.    Assessment:  The primary encounter diagnosis was Kidney stone. Diagnoses of UTI (lower urinary tract infection), Nausea, and Sinusitis were also pertinent to this visit.  Her nausea may be coming from a urinary tract infection or from kidney stone. Today her UA only showed red blood cells and no bacteria or white cells. In addition her white blood count was better. Her blood chemistries all look fairly good. I gave her the option of either going to the emergency room or trying some oral treatments and she felt that she wanted to go home on oral meds. I suggested she  go to the emergency room if she got worse or wasn't better tomorrow.  Plan:   1.  The following meds were prescribed:   New Prescriptions   CEPHALEXIN (KEFLEX) 500 MG CAPSULE  Take 1 capsule (500 mg total) by mouth 3 (three) times daily.   PROMETHAZINE (PHENERGAN) 25 MG TABLET    Take 1 tablet (25 mg total) by mouth every 6 (six) hours as needed for nausea.   TAMSULOSIN HCL (FLOMAX) 0.4 MG CAPS    Take 1 capsule (0.4 mg total) by mouth daily.   2.  The patient was instructed in symptomatic care and handouts were given. 3.  The patient was told to return if becoming worse in any way, if no better in 3 or 4 days, and given some red flag symptoms that would indicate earlier return. She was advised to followup with her primary care physician on Monday. She should force fluids, and strain all urine.    Reuben Likes, MD 10/06/11 2126

## 2011-10-06 NOTE — Discharge Instructions (Signed)
B.R.A.T. Diet Your doctor has recommended the B.R.A.T. diet for you or your child until the condition improves. This is often used to help control diarrhea and vomiting symptoms. If you or your child can tolerate clear liquids, you may have:  Bananas.   Rice.   Applesauce.   Toast (and other simple starches such as crackers, potatoes, noodles).  Be sure to avoid dairy products, meats, and fatty foods until symptoms are better. Fruit juices such as apple, grape, and prune juice can make diarrhea worse. Avoid these. Continue this diet for 2 days or as instructed by your caregiver. Document Released: 05/29/2005 Document Revised: 05/18/2011 Document Reviewed: 11/15/2006 [facility] Patient Information 2012 [place].Clear Liquid Diet The clear liquid dietconsists of foods that are liquid or will become liquid at room temperature.You should be able to see through the liquid and beverages. Examples of foods allowed on a clear liquid diet include fruit juice, broth or bouillon, gelatin, or frozen ice pops. The purpose of this diet is to provide necessary fluid, electrolytes such as sodium and potassium, and energy to keep the body functioning during times when you are not able to consume a regular diet.A clear liquid diet should not be continued for long periods of time as it is not nutritionally adequate.  REASONS FOR USING A CLEAR LIQUID DIET  In sudden onset (acute) conditions for a patient before or after surgery.   As the first step in oral feeding.   For fluid and electrolyte replacement in diarrheal diseases.   As a diet before certain medical tests are performed.  ADEQUACY The clear liquid diet is adequate only in ascorbic acid, according to the Recommended Dietary Allowances of the [employer]. CHOOSING FOODS Breads and Starches  Allowed:  None are allowed.   Avoid: All are avoided.  Vegetables  Allowed:  Strained tomato or vegetable juice.   Avoid: Any  others.  Fruit  Allowed:  Strained fruit juices and fruit drinks. Include 1 serving of citrus or vitamin C-enriched fruit juice daily.   Avoid: Any others.  Meat and Meat Substitutes  Allowed:  None are allowed.   Avoid: All are avoided.  Milk  Allowed:  None are allowed.   Avoid: All are avoided.  Soups and Combination Foods  Allowed:  Clear bouillon, broth, or strained broth-based soups.   Avoid: Any others.  Desserts and Sweets  Allowed:  Sugar, honey. High protein gelatin. Flavored gelatin, ices, or frozen ice pops that do not contain milk.   Avoid: Any others.  Fats and Oils  Allowed:  None are allowed.   Avoid: All are avoided.  Beverages  Allowed: Cereal beverages, coffee (regular or decaffeinated), tea, or soda at the discretion of your caregiver.   Avoid: Any others.  Condiments  Allowed:  Iodized salt.   Avoid: Any others, including pepper.  Supplements  Allowed:  Liquid nutrition beverages.   Avoid: Any others that contain lactose or fiber.  SAMPLE MEAL PLAN Breakfast  4 oz (120 mL) strained orange juice.    to 1 cup (125 to 250 mL) gelatin (plain or fortified).   1 cup (250 mL) beverage (coffee or tea).   Sugar, if desired.  Midmorning Snack   cup (125 mL) gelatin (plain or fortified).  Lunch  1 cup (250 mL) broth or consomm.   4 oz (120 mL) strained grapefruit juice.    cup (125 mL) gelatin (plain or fortified).   1 cup (250 mL) beverage (coffee or tea).  Sugar, if desired.  Midafternoon Snack   cup (125 mL) fruit ice.    cup (125 mL) strained fruit juice.  Dinner  1 cup (250 mL) broth or consomm.    cup (125 mL) cranberry juice.    cup (125 mL) flavored gelatin (plain or fortified).   1 cup (250 mL) beverage (coffee or tea).   Sugar, if desired.  Evening Snack  4 oz (120 mL) strained apple juice (vitamin C-fortified).    cup (125 mL) flavored gelatin (plain or fortified).  Document Released: 05/29/2005  Document Revised: 05/18/2011 Document Reviewed: 08/26/2010 [facility] Patient Information 2012 [place], [place].  Force fluids and liquids.  Strain urine.  Follow up with primary care doctor on Monday.

## 2011-10-06 NOTE — ED Notes (Signed)
Zofran injection administered in XR dept after pt had emesis.

## 2011-10-07 ENCOUNTER — Telehealth: Payer: Self-pay | Admitting: Family Medicine

## 2011-10-07 NOTE — Telephone Encounter (Signed)
Patient calls regarding her kidney stone. She says she went to the ER two days ago and was told she hat a UTI, then went to the urgent care yesterday and told she had a kidney stone.  She says that today she has had more blood in her urine and it is bright red, but no clots.  She says the pain is worse and her nausea is also worse.  She wanted to know if this was normal with a kidney stone.   She is taking the flomax, phenergan, and keflex prescribed for this. She says they did not give her a strainer to catch the stone.   I advised that blood with passing kidney stone and pain and nausea all common.  Advised to keep taking the medications she has, but if pain is too severe, she has fevers, or cannot keep fluids down, she should come in to the ED tonight.   Patient voices understanding.   Elsie Sakuma 10/07/2011 5:02 PM

## 2011-10-08 ENCOUNTER — Telehealth: Payer: Self-pay | Admitting: Family Medicine

## 2011-10-08 LAB — URINE CULTURE

## 2011-10-08 NOTE — Telephone Encounter (Signed)
Patient also today with 2 complaints. First, she reports that she is trying to pass a kidney stone. She reports that she is noting a small amount of blood in her urine and is in considerable pain. She was seen at urgent care 2 days ago and was advised to be seen at the ER within one to 2 days if she was not getting better. Second, she reports that she has not had a bowel movement in 5 days. She has not been eating much but is feeling somewhat bloated at this time. She denies fevers, chills, but does espouse some nausea secondary to pain.  I advised the patient that her pain was today at the managed with over-the-counter pain medications that she needs to be seen either at an urgent care ward at the emergency department. I recommended over-the-counter stool softeners, laxatives, or enema for her feeling of abdominal bloating.

## 2011-10-09 ENCOUNTER — Telehealth (HOSPITAL_COMMUNITY): Payer: Self-pay | Admitting: *Deleted

## 2011-10-09 ENCOUNTER — Inpatient Hospital Stay (HOSPITAL_COMMUNITY): Payer: Medicaid Other

## 2011-10-09 ENCOUNTER — Inpatient Hospital Stay (HOSPITAL_COMMUNITY)
Admission: AD | Admit: 2011-10-09 | Discharge: 2011-10-12 | DRG: 690 | Disposition: A | Discharge status: Home / Self Care | Payer: Medicaid Other | Source: Ambulatory Visit | Attending: Family Medicine | Admitting: Family Medicine

## 2011-10-09 ENCOUNTER — Ambulatory Visit (INDEPENDENT_AMBULATORY_CARE_PROVIDER_SITE_OTHER): Payer: Medicaid Other | Admitting: Family Medicine

## 2011-10-09 ENCOUNTER — Encounter (HOSPITAL_COMMUNITY): Payer: Self-pay | Admitting: General Practice

## 2011-10-09 ENCOUNTER — Encounter: Payer: Self-pay | Admitting: Family Medicine

## 2011-10-09 DIAGNOSIS — I10 Essential (primary) hypertension: Secondary | ICD-10-CM

## 2011-10-09 DIAGNOSIS — N2889 Other specified disorders of kidney and ureter: Secondary | ICD-10-CM | POA: Diagnosis present

## 2011-10-09 DIAGNOSIS — R112 Nausea with vomiting, unspecified: Secondary | ICD-10-CM | POA: Diagnosis present

## 2011-10-09 DIAGNOSIS — Z6841 Body Mass Index (BMI) 40.0 and over, adult: Secondary | ICD-10-CM

## 2011-10-09 DIAGNOSIS — N390 Urinary tract infection, site not specified: Principal | ICD-10-CM | POA: Diagnosis present

## 2011-10-09 DIAGNOSIS — E876 Hypokalemia: Secondary | ICD-10-CM | POA: Diagnosis not present

## 2011-10-09 DIAGNOSIS — F319 Bipolar disorder, unspecified: Secondary | ICD-10-CM | POA: Diagnosis present

## 2011-10-09 DIAGNOSIS — R109 Unspecified abdominal pain: Secondary | ICD-10-CM | POA: Insufficient documentation

## 2011-10-09 DIAGNOSIS — N200 Calculus of kidney: Secondary | ICD-10-CM

## 2011-10-09 DIAGNOSIS — R1110 Vomiting, unspecified: Secondary | ICD-10-CM

## 2011-10-09 DIAGNOSIS — B952 Enterococcus as the cause of diseases classified elsewhere: Secondary | ICD-10-CM | POA: Diagnosis present

## 2011-10-09 DIAGNOSIS — R1115 Cyclical vomiting syndrome unrelated to migraine: Secondary | ICD-10-CM | POA: Diagnosis present

## 2011-10-09 DIAGNOSIS — G43909 Migraine, unspecified, not intractable, without status migrainosus: Secondary | ICD-10-CM

## 2011-10-09 DIAGNOSIS — K5900 Constipation, unspecified: Secondary | ICD-10-CM | POA: Diagnosis present

## 2011-10-09 HISTORY — DX: Gastro-esophageal reflux disease without esophagitis: K21.9

## 2011-10-09 HISTORY — DX: Calculus of kidney: N20.0

## 2011-10-09 HISTORY — DX: Anxiety disorder, unspecified: F41.9

## 2011-10-09 HISTORY — DX: Depression, unspecified: F32.A

## 2011-10-09 HISTORY — DX: Personal history of other diseases of the respiratory system: Z87.09

## 2011-10-09 HISTORY — DX: Migraine, unspecified, not intractable, without status migrainosus: G43.909

## 2011-10-09 HISTORY — DX: Encounter for other specified aftercare: Z51.89

## 2011-10-09 HISTORY — DX: Reserved for inherently not codable concepts without codable children: IMO0001

## 2011-10-09 HISTORY — DX: Major depressive disorder, single episode, unspecified: F32.9

## 2011-10-09 LAB — COMPREHENSIVE METABOLIC PANEL
ALT: 58 U/L — ABNORMAL HIGH (ref 0–35)
AST: 35 U/L (ref 0–37)
Albumin: 4.1 g/dL (ref 3.5–5.2)
CO2: 27 mEq/L (ref 19–32)
Calcium: 9.5 mg/dL (ref 8.4–10.5)
Creatinine, Ser: 1.02 mg/dL (ref 0.50–1.10)
GFR calc non Af Amer: 71 mL/min — ABNORMAL LOW (ref >90)
Sodium: 139 mEq/L (ref 135–145)
Total Protein: 8 g/dL (ref 6.0–8.3)

## 2011-10-09 LAB — CBC
MCH: 29.3 pg (ref 26.0–34.0)
MCV: 89.2 fL (ref 78.0–100.0)
Platelets: 334 10*3/uL (ref 150–400)
RBC: 4.74 MIL/uL (ref 3.87–5.11)
RDW: 12.7 % (ref 11.5–15.5)

## 2011-10-09 LAB — DIFFERENTIAL
Basophils Absolute: 0 10*3/uL (ref 0.0–0.1)
Eosinophils Relative: 1 % (ref 0–5)
Lymphocytes Relative: 45 % (ref 12–46)
Lymphs Abs: 3.2 10*3/uL (ref 0.7–4.0)
Monocytes Absolute: 0.4 10*3/uL (ref 0.1–1.0)
Monocytes Relative: 5 % (ref 3–12)
Neutro Abs: 3.4 10*3/uL (ref 1.7–7.7)

## 2011-10-09 LAB — POCT URINALYSIS DIPSTICK
Nitrite, UA: NEGATIVE
Protein, UA: 100
pH, UA: 5.5

## 2011-10-09 LAB — POCT URINE PREGNANCY: Preg Test, Ur: NEGATIVE

## 2011-10-09 LAB — POCT UA - MICROSCOPIC ONLY

## 2011-10-09 MED ORDER — ARIPIPRAZOLE 10 MG PO TABS
10.0000 mg | ORAL_TABLET | Freq: Every day | ORAL | Status: DC
  Administered 2011-10-09 – 2011-10-12 (×4): 10 mg via ORAL
  Filled 2011-10-09 (×4): qty 1

## 2011-10-09 MED ORDER — QUETIAPINE FUMARATE 50 MG PO TABS
50.0000 mg | ORAL_TABLET | Freq: Every day | ORAL | Status: DC
  Administered 2011-10-11: 50 mg via ORAL
  Filled 2011-10-09 (×4): qty 1

## 2011-10-09 MED ORDER — HEPARIN SODIUM (PORCINE) 5000 UNIT/ML IJ SOLN
5000.0000 [IU] | Freq: Three times a day (TID) | INTRAMUSCULAR | Status: DC
  Administered 2011-10-09 – 2011-10-12 (×9): 5000 [IU] via SUBCUTANEOUS
  Filled 2011-10-09 (×12): qty 1

## 2011-10-09 MED ORDER — LORATADINE 10 MG PO TABS
10.0000 mg | ORAL_TABLET | Freq: Every day | ORAL | Status: DC
  Administered 2011-10-09 – 2011-10-12 (×4): 10 mg via ORAL
  Filled 2011-10-09 (×4): qty 1

## 2011-10-09 MED ORDER — ACETAMINOPHEN 650 MG RE SUPP: 650.0000 mg | Freq: Four times a day (QID) | RECTAL | Status: DC | PRN

## 2011-10-09 MED ORDER — CEPHALEXIN 500 MG PO CAPS
500.0000 mg | ORAL_CAPSULE | Freq: Three times a day (TID) | ORAL | Status: DC
  Filled 2011-10-09 (×2): qty 1

## 2011-10-09 MED ORDER — BISACODYL 10 MG RE SUPP
10.0000 mg | Freq: Once | RECTAL | Status: AC
  Administered 2011-10-09: 10 mg via RECTAL
  Filled 2011-10-09: qty 1

## 2011-10-09 MED ORDER — POLYETHYLENE GLYCOL 3350 17 G PO PACK
17.0000 g | PACK | Freq: Two times a day (BID) | ORAL | Status: DC
  Administered 2011-10-09 – 2011-10-12 (×5): 17 g via ORAL
  Filled 2011-10-09 (×7): qty 1

## 2011-10-09 MED ORDER — HYDROCHLOROTHIAZIDE 25 MG PO TABS
25.0000 mg | ORAL_TABLET | Freq: Every day | ORAL | Status: DC
  Administered 2011-10-09 – 2011-10-12 (×4): 25 mg via ORAL
  Filled 2011-10-09 (×4): qty 1

## 2011-10-09 MED ORDER — LISINOPRIL 5 MG PO TABS
5.0000 mg | ORAL_TABLET | Freq: Every day | ORAL | Status: DC
  Administered 2011-10-09 – 2011-10-12 (×4): 5 mg via ORAL
  Filled 2011-10-09 (×5): qty 1

## 2011-10-09 MED ORDER — TAMSULOSIN HCL 0.4 MG PO CAPS
0.4000 mg | ORAL_CAPSULE | Freq: Every day | ORAL | Status: DC
  Administered 2011-10-09 – 2011-10-10 (×2): 0.4 mg via ORAL
  Filled 2011-10-09 (×2): qty 1

## 2011-10-09 MED ORDER — CITALOPRAM HYDROBROMIDE 20 MG PO TABS
20.0000 mg | ORAL_TABLET | Freq: Every day | ORAL | Status: DC
  Administered 2011-10-09 – 2011-10-12 (×4): 20 mg via ORAL
  Filled 2011-10-09 (×4): qty 1

## 2011-10-09 MED ORDER — SODIUM CHLORIDE 0.9 % IV SOLN
INTRAVENOUS | Status: DC
  Administered 2011-10-09 – 2011-10-11 (×5): via INTRAVENOUS

## 2011-10-09 MED ORDER — NITROFURANTOIN MONOHYD MACRO 100 MG PO CAPS
100.0000 mg | ORAL_CAPSULE | Freq: Two times a day (BID) | ORAL | Status: DC
  Administered 2011-10-09: 100 mg via ORAL
  Filled 2011-10-09 (×2): qty 1

## 2011-10-09 MED ORDER — DEXTROSE 5 % IV SOLN
120.0000 mg | Freq: Three times a day (TID) | INTRAVENOUS | Status: DC
  Administered 2011-10-09 – 2011-10-10 (×3): 120 mg via INTRAVENOUS
  Filled 2011-10-09 (×7): qty 3

## 2011-10-09 MED ORDER — PROMETHAZINE HCL 25 MG PO TABS
25.0000 mg | ORAL_TABLET | Freq: Four times a day (QID) | ORAL | Status: DC | PRN
  Administered 2011-10-09 – 2011-10-10 (×4): 25 mg via ORAL
  Filled 2011-10-09 (×4): qty 1

## 2011-10-09 MED ORDER — CLONAZEPAM 0.5 MG PO TABS
0.5000 mg | ORAL_TABLET | Freq: Two times a day (BID) | ORAL | Status: DC | PRN
  Administered 2011-10-09 – 2011-10-12 (×5): 0.5 mg via ORAL
  Filled 2011-10-09 (×5): qty 1

## 2011-10-09 MED ORDER — PANTOPRAZOLE SODIUM 40 MG PO TBEC
40.0000 mg | DELAYED_RELEASE_TABLET | Freq: Every day | ORAL | Status: DC
  Administered 2011-10-10 – 2011-10-12 (×3): 40 mg via ORAL
  Filled 2011-10-09 (×4): qty 1

## 2011-10-09 MED ORDER — MORPHINE SULFATE 2 MG/ML IJ SOLN
2.0000 mg | INTRAMUSCULAR | Status: DC | PRN
  Administered 2011-10-09 – 2011-10-10 (×5): 2 mg via INTRAVENOUS
  Filled 2011-10-09 (×5): qty 1

## 2011-10-09 MED ORDER — GABAPENTIN 300 MG PO CAPS
300.0000 mg | ORAL_CAPSULE | Freq: Every day | ORAL | Status: DC
  Administered 2011-10-11: 300 mg via ORAL
  Filled 2011-10-09 (×4): qty 1

## 2011-10-09 MED ORDER — ACETAMINOPHEN 325 MG PO TABS: 650.0000 mg | ORAL_TABLET | Freq: Four times a day (QID) | ORAL | Status: DC | PRN

## 2011-10-09 MED ORDER — SENNOSIDES-DOCUSATE SODIUM 8.6-50 MG PO TABS: 1.0000 | ORAL_TABLET | Freq: Every evening | ORAL | Status: DC | PRN

## 2011-10-09 MED ORDER — SODIUM CHLORIDE 0.9 % IV SOLN
2.0000 g | Freq: Four times a day (QID) | INTRAVENOUS | Status: DC
  Administered 2011-10-09 – 2011-10-10 (×4): 2 g via INTRAVENOUS
  Filled 2011-10-09 (×7): qty 2000

## 2011-10-09 NOTE — Progress Notes (Signed)
ANTIBIOTIC CONSULT NOTE - INITIAL  Pharmacy Consult for gentamicin Indication: UTI, synergy with ampicillin, r/o kidney stone  Allergies  Allergen Reactions  . Sibutramine     REACTION: urticaria (hives)  . Sibutramine Hcl Monohydrate     Patient Measurements:   Adjusted Body Weight: ~130kg  Vital Signs: Temp: 97.7 F (36.5 C) (04/29 1143) Temp src: Axillary (04/29 1143) BP: 131/91 mmHg (04/29 1143) Pulse Rate: 103  (04/29 1143) Intake/Output from previous day:   Intake/Output from this shift:    Labs:  [facility] 10/09/11 1242 10/06/11 1853  WBC 7.1 9.9  HGB 13.9 13.7  PLT 334 325  LABCREA -- --  CREATININE 1.02 1.11*   The CrCl is unknown because both a height and weight (above a minimum accepted value) are required for this calculation. No results found for this basename: VANCOTROUGH:2,VANCOPEAK:2,VANCORANDOM:2,GENTTROUGH:2,GENTPEAK:2,GENTRANDOM:2,TOBRATROUGH:2,TOBRAPEAK:2,TOBRARND:2,AMIKACINPEAK:2,AMIKACINTROU:2,AMIKACIN:2, in the last 72 hours   Microbiology: Recent Results (from the past 720 hour(s))  URINE CULTURE     Status: Normal   Collection Time   10/06/11  7:54 PM      Component Value Range Status Comment   Specimen Description URINE, CLEAN CATCH   Final    Special Requests Normal   Final    Culture  Setup Time [id number]   Final    Colony Count 65,000 COLONIES/ML   Final    Culture ENTEROCOCCUS SPECIES   Final    Report Status 10/08/2011 FINAL   Final    Organism ID, Bacteria ENTEROCOCCUS SPECIES   Final     Medical History: Past Medical History  Diagnosis Date  . Hypertension   . ANEMIA 08/14/2008    Qualifier: Diagnosis of  By: Karn Pickler MD, Shanda Bumps    . Bipolar 1 disorder     Medications:  Prescriptions prior to admission  Medication Sig Dispense Refill  . ARIPiprazole (ABILIFY) 5 MG tablet Take 2 tablets (10 mg total) by mouth daily.  30 tablet  6  . cephALEXin (KEFLEX) 500 MG capsule Take 1 capsule (500 mg total) by mouth 3 (three) times  daily.  30 capsule  0  . citalopram (CELEXA) 40 MG tablet Take 40 mg by mouth daily.      . fluticasone (FLONASE) 50 MCG/ACT nasal spray Place 2 sprays into the nose daily.  16 g  6  . gabapentin (NEURONTIN) 100 MG capsule Take 3 capsules (300 mg total) by mouth at bedtime.  90 capsule  6  . hydrochlorothiazide 25 MG tablet TAKE 1 TABLET BY MOUTH EVERY MORNING  31 tablet  11  . hydrOXYzine (ATARAX/VISTARIL) 25 MG tablet Take 25 mg by mouth 3 (three) times daily as needed. 1 tablet in the morning and a 2 tablets at bedtime      . lisinopril (PRINIVIL,ZESTRIL) 5 MG tablet TAKE ONE TABLET BY MOUTH DAILY  90 tablet  0  . loratadine (CLARITIN) 10 MG tablet Take 1 tablet (10 mg total) by mouth daily.  30 tablet  5  . omeprazole (PRILOSEC) 20 MG capsule Take 1 capsule (20 mg total) by mouth daily.  30 capsule  11  . polyethylene glycol powder (GLYCOLAX/MIRALAX) powder Take 17 g by mouth daily.  527 g  6  . promethazine (PHENERGAN) 25 MG tablet Take 1 tablet (25 mg total) by mouth every 6 (six) hours as needed for nausea.  30 tablet  0  . Tamsulosin HCl (FLOMAX) 0.4 MG CAPS Take 1 capsule (0.4 mg total) by mouth daily.  7 capsule  0  . topiramate (TOPAMAX)  50 MG tablet Take 50 mg by mouth 2 (two) times daily.      . nitrofurantoin, macrocrystal-monohydrate, (MACROBID) 100 MG capsule Take 1 capsule (100 mg total) by mouth 2 (two) times daily.  10 capsule  0   Assessment: 36 yo obese lady seen in ED 4/26 for possible sinus infection and UTI, treated with Keflex and Macrobid.  She has had vomiting and severe abdominal pain and has not had a BM in 5 days.  She notes urinary frequency and blood in her urine.  Xray on 4/26 showed possible kidney stone.  She is admitted today and is to receive unasyn and gentamicin for synergy.  UC 4/26>>enterococcus  Goal of Therapy:  Gentamicin peak ~3-4 and trough <1.5  Plan:  Gentamicin 120 mg IV q8 hours. F/u clinical course, cultures and renal function.  Talbert Cage  [place] 10/09/2011,2:28 PM

## 2011-10-09 NOTE — H&P (Signed)
Pt seen and examined by me with Dr. Denyse Amass.  I agree with his plan of care and note.  Please refer to his note for details.  In brief, this is a 36 yo morbidly obese female with several days of nausea and vomiting, unrelieved by po meds.  Etiology includes possible pyelo vs. Kidney stone vs. Other etiology.  Will eval with abd CT scan, admit for IV antiemetics and fluids.

## 2011-10-09 NOTE — Progress Notes (Signed)
Brief Progress Note:   Patient arrived to floor, no IV started, no medications administered yet.  Patient appears uncomfortable and complaining of back pain.  Otherwise she looks stable.  Nursing to start IV and administer morphine and phenergan shortly. CT scan ordered to evaluate kidney stone.  Will follow closely.   Rushie Brazel 10/09/2011 12:11 PM

## 2011-10-09 NOTE — ED Notes (Signed)
Urine culture positive for enterococcus, Rx received from Dr. Chaney Malling for [facility] and called to [facility] on [address]  Geradine Girt pt to make aware and informed by patient that she had gone to the ED prior to coming to [facility] and was given an Rx for Macrobid at that time and is currently taking it.

## 2011-10-09 NOTE — Progress Notes (Signed)
Karen Brennan is an 36 y.o. female.   Chief Complaint: vomiting and abdominal pain  HPI: [name] was recently seen in the emergency room on April 26 for possible sinus infection. At that point she was found additionally have abdominal pain and a abdominal x-ray tentatively diagnosed a urinary tract infection.  She started vomiting and having severe pain. She was additionally seen in urgent care and given Phenergan for nausea.  In the interim she has noted continued bilateral pain and intractable vomiting. She has vomited multiple times and has difficulty keeping her fluids down..  Additionally she has not had a bowel movement in 5 days.  She is passing gas however.  When she urinates she notes urinary frequency and blood in her urine.  She is currently taking Keflex and Macrobid for the sinus infection and a possible urinary tract infection.  She denies any fevers or chills difficulty breathing or chest pain.  Past Medical History  Diagnosis Date  . Hypertension   . ANEMIA 08/14/2008    Qualifier: Diagnosis of  By: Karn Pickler MD, Shanda Bumps    . Bipolar 1 disorder     Past Surgical History  Procedure Date  . Tubal ligation   . Ankle surgery   . Endometrial ablation     No family history on file. Social History:  reports that she has never smoked. She quit smokeless tobacco use about 7 years ago. She reports that she does not drink alcohol or use illicit drugs.  Allergies:  Allergies  Allergen Reactions  . Meridia   . Sibutramine     REACTION: urticaria (hives)     ROS reviewed please see HPI for further details.   Exam:  Blood pressure 127/87, pulse 102, temperature 97 F (36.1 C), temperature source Oral, height 6\' 1"  (1.854 m), weight 459 lb 3.2 oz (208.292 kg).Body mass index is 60.58 kg/(m^2). Gen: Well NAD, morbidly obese HEENT: EOMI,  MMM Lungs: CTABL Nl WOB Heart: RRR no MRG Abd: NABS, NT, ND no costovertebral angle tenderness rebound or guarding Exts: Non edematous BL  LE,  warm and well perfused.   Urine pregnancy is negative. Urinalysis significant for ketones, blood, leukocyte esterase Dg Abd Acute W/chest  10/06/2011  *RADIOLOGY REPORT*  Clinical Data: Nausea for 3 days  ACUTE ABDOMEN SERIES (ABDOMEN 2 VIEW & CHEST 1 VIEW)  Comparison: 03/14/2010  Findings: Normal heart size.  Clear lungs.  No free intraperitoneal gas.  No disproportionate dilatation of bowel.  Unremarkable soft tissues.  Phleboliths project over the pelvis.  Postoperative changes.  Tiny calcification checks over the left renal shadow.  IMPRESSION: Nonobstructive bowel gas pattern.  Possible left nephrolithiasis.  Original Report Authenticated By: Donavan Burnet, M.D.    Assessment/Plan 36 year old woman with possible kidney stone. 1) possible kidney stone: Patient has abdominal pain hematuria dysuria and a calcification in a location that would indicate a kidney stone noted on an x-ray April 26.  She continues to experience abdominal pain hematuria and dysuria. She additionally has not had a bowel movement in several days. The differential includes obstruction versus other serious abdominal process Plan to complet evaluation with urine culture, and abdominal pelvic CT scan without contrast.  She may have hydronephrosis which would be diagnosed with CT scan.  We'll followup evaluation and treated with morphine.  2) vomiting: Patient has essentially intractable vomiting resistant to oral Phenergan.  She is mildly tachycardic and I suspect she is mildly dehydrated as well. Plan to admit provide IV fluids and  IV nausea medications.  Normal diet.    3) hypertension: Plan to continue normal home medications.  4) bipolar: Plan to continue Abilify and Seroquel.  5) morbid obesity: Continue normal diet. We'll continue to address this at discharge. I discussed the patient's weight with the CT technician who does think she'll be able to fit inside the CT scanner.   6) FEN/GI: Normal saline at 100 mL per  hour, IV Phenergan and Zofran  7) Dispo: Admit to inpatient pending evaluation.  Karen Brennan 10/09/2011, 11:15 AM

## 2011-10-09 NOTE — Assessment & Plan Note (Signed)
Admitting to inpatient please see above

## 2011-10-10 LAB — CBC
HCT: 37.8 % (ref 36.0–46.0)
MCHC: 32.8 g/dL (ref 30.0–36.0)
Platelets: 288 10*3/uL (ref 150–400)
RDW: 12.9 % (ref 11.5–15.5)
WBC: 7.6 10*3/uL (ref 4.0–10.5)

## 2011-10-10 LAB — COMPREHENSIVE METABOLIC PANEL
AST: 30 U/L (ref 0–37)
Albumin: 3.4 g/dL — ABNORMAL LOW (ref 3.5–5.2)
Alkaline Phosphatase: 53 U/L (ref 39–117)
BUN: 13 mg/dL (ref 6–23)
Chloride: 99 mEq/L (ref 96–112)
Creatinine, Ser: 1.05 mg/dL (ref 0.50–1.10)
Potassium: 3.1 mEq/L — ABNORMAL LOW (ref 3.5–5.1)
Total Protein: 6.8 g/dL (ref 6.0–8.3)

## 2011-10-10 MED ORDER — LEVOFLOXACIN 750 MG PO TABS
750.0000 mg | ORAL_TABLET | Freq: Every day | ORAL | Status: DC
  Administered 2011-10-10 – 2011-10-12 (×3): 750 mg via ORAL
  Filled 2011-10-10 (×3): qty 1

## 2011-10-10 MED ORDER — SENNOSIDES-DOCUSATE SODIUM 8.6-50 MG PO TABS
1.0000 | ORAL_TABLET | Freq: Every day | ORAL | Status: DC
  Administered 2011-10-10 – 2011-10-11 (×2): 1 via ORAL
  Filled 2011-10-10 (×2): qty 1

## 2011-10-10 MED ORDER — HYDROCODONE-ACETAMINOPHEN 5-325 MG PO TABS
1.0000 | ORAL_TABLET | ORAL | Status: DC | PRN
  Administered 2011-10-10 – 2011-10-11 (×3): 2 via ORAL
  Filled 2011-10-10 (×3): qty 2

## 2011-10-10 MED ORDER — POTASSIUM CHLORIDE CRYS ER 20 MEQ PO TBCR
20.0000 meq | EXTENDED_RELEASE_TABLET | Freq: Three times a day (TID) | ORAL | Status: AC
  Administered 2011-10-10 (×3): 20 meq via ORAL
  Filled 2011-10-10 (×3): qty 1

## 2011-10-10 MED ORDER — ONDANSETRON 8 MG PO TBDP
8.0000 mg | ORAL_TABLET | Freq: Four times a day (QID) | ORAL | Status: DC | PRN
  Administered 2011-10-11 (×2): 8 mg via ORAL
  Filled 2011-10-10 (×3): qty 1

## 2011-10-10 MED ORDER — ONDANSETRON 4 MG PO TBDP
4.0000 mg | ORAL_TABLET | Freq: Once | ORAL | Status: DC
  Filled 2011-10-10: qty 1

## 2011-10-10 MED ORDER — ONDANSETRON 4 MG PO TBDP
4.0000 mg | ORAL_TABLET | Freq: Three times a day (TID) | ORAL | Status: DC | PRN
  Administered 2011-10-10 (×2): 4 mg via ORAL
  Filled 2011-10-10: qty 1

## 2011-10-10 NOTE — Progress Notes (Signed)
Clinical Social Work Department BRIEF PSYCHOSOCIAL ASSESSMENT 10/10/2011  Patient:  Karen Brennan, Karen Brennan     Account Number:  [id number]     Admit date:  10/09/2011  Clinical Social Worker:  Lourdes Sledge  Date/Time:  10/10/2011 03:33 PM  Referred by:  Physician  Date Referred:  10/10/2011 Referred for  Psychosocial assessment  Advanced Directives   Other Referral:   Interview type:  Patient Other interview type:    PSYCHOSOCIAL DATA Living Status:  ALONE Admitted from facility:   Level of care:   Primary support name:  Denies having Primary support relationship to patient:   Degree of support available:   Pt reports she does not have any support system besides her children.    CURRENT CONCERNS Current Concerns  Other - See comment   Other Concerns:   Pt reports her father recently dying and having a difficult time coping.    SOCIAL WORK ASSESSMENT / PLAN CSW received a referral as pt reports her father recently dying and having a difficult time coping. Pt also requested reading material on advanced directives. CSW visited pt room and explored whether pt would like to discuss her feelings about the passing of her father and whether pt would be interested in resources for counseling. Pt stated she did not want to talk however would accept counseling resources and an advanced directives packet. CSW provided pt with counseling resources, an advanced directives packet and CSW contact information. Pt received information and stated she had no further CSW needs. CSW is signing off.   Assessment/plan status:  No Further Intervention Required Other assessment/ plan:   Information/referral to community resources:   CSW provided pt with information to resources for grief counseling at [facility] [place] who provides free counseling. CSW also provided pt with resources to [facility] the [place].    PATIENT'S/FAMILY'S RESPONSE TO PLAN OF CARE: Pt laying in bed,  alert and oriented. Pt communicated very minimally with CSW and stated she did not want to talk. Pt accept counseling resources and advanced directives and denied needing additional information or emotional support. CSW signing off.        Theresia Bough, MSW, Theresia Majors [phone]

## 2011-10-10 NOTE — Discharge Summary (Signed)
Discharge Summary 10/12/2011 3:23 PM  Karen Brennan DOB: 1976/02/18 MRN: [id number]  Date of Admission: 10/09/2011 Date of Discharge: 5/2/201305/02/13  PCP: Edd Arbour, MD, MD Consultants: none  Reason for Admission: nausea and vomiting, possible kidney stone  Discharge Diagnosis 1. UTI - enterococcus 2. Flank pain 3. Nausea and vomiting 4. HTN 5. Bipolar  Hospital Course: 36 year old woman with nausea, vomiting, flank pain and UA concerning for possible kidney stone.   1. UTI: Urine culture from 4/26 grew pan-sensitive enterococcus. She was afebrile throughout this hospital stay.  She was started on ampicillin and gentamycin for synergy.  After 24 hours of IV antibiotics, she was transitioned to PO levaquin.  She was monitored on oral therapy and remained afebrile. She was discharged on 7 days of levaquin to complete a 10 day course.   2. Flank Pain and hematuria: Etiology unclear.  Flank pain likely secondary to UTI with possible early upper tract involvement vs kidney stone that she passed.  She was evaluated with a CT abd/pelvis, and this was negative for kidney stone.  She was initially given IV morphine for pain control; this was transitioned to PO Norco. On discharge, her pain was greatly improved, requiring little pain medicine. The hematuria persisted during hospitalization and was mildly improving on discharge. Given an unclear etiology for the hematuria, an outpatient referral with urology was arranged.   3. Nausea and vomiting: Started on phenergan and zofran.  Vomiting resolved and she was tolerating her diet well at discharge with minimal nausea.   4. HTN: stable during hospitalization on her home lisinopril 5mg  and hydrochlorothiazide 25mg . Patient complained of mild dizziness and orthostatics were checked which showed an increase in blood pressure with standing.   5. Bipolar: Stable this admission on home medications.   Discharge Exam: Filed Vitals:   10/12/11  0609 10/12/11 0903 10/12/11 0905 10/12/11 0907  BP: 98/47 90/51 laying 130/59 sitting 136/88 standing  Pulse: 77 96 64 75  Temp: 97.4 F (36.3 C) 97.7 F (36.5 C) 97.7 F (36.5 C) 97.7 F (36.5 C)  TempSrc: Oral Oral Oral Oral  Resp: 16 16 18 18   Height:      Weight:      SpO2: 99%      Output: 500cc plus 1x unmeasured  General: alert, cooperative, morbidly obese, in no distress, sitting in bed.  HEENT: moist mucous membranes, PERRLA  CV: RRR, no murmur  Pulm: CTAB, no wheezes  Abd: +BS, soft, no guarding, no reproducible tenderness to palpation, no CVA tenderness  Back: no tenderness to palpation  Ext: no edema, palpable DP pulses  Neuro: alert, oriented, moving all extremities  Procedures: none  Discharge Medications  Medication List  As of 10/12/2011  3:23 PM   START taking these medications         HYDROcodone-acetaminophen 5-325 MG per tablet   Commonly known as: NORCO   Take 1-2 tablets by mouth every 4 (four) hours as needed.      levofloxacin 750 MG tablet   Commonly known as: LEVAQUIN   Take 1 tablet (750 mg total) by mouth daily. Take for another 7 days      ondansetron 4 MG disintegrating tablet   Commonly known as: ZOFRAN-ODT   Take 1 tablet (4 mg total) by mouth once.         CONTINUE taking these medications         ARIPiprazole 5 MG tablet   Commonly known as: ABILIFY   Take 2 tablets (10  mg total) by mouth daily.      citalopram 40 MG tablet   Commonly known as: CELEXA      fluticasone 50 MCG/ACT nasal spray   Commonly known as: FLONASE   Place 2 sprays into the nose daily.      gabapentin 100 MG capsule   Commonly known as: NEURONTIN   Take 3 capsules (300 mg total) by mouth at bedtime.      hydrochlorothiazide 25 MG tablet   Commonly known as: HYDRODIURIL   TAKE 1 TABLET BY MOUTH EVERY MORNING      hydrOXYzine 25 MG tablet   Commonly known as: ATARAX/VISTARIL      lisinopril 5 MG tablet   Commonly known as: PRINIVIL,ZESTRIL   TAKE  ONE TABLET BY MOUTH DAILY      loratadine 10 MG tablet   Commonly known as: CLARITIN   Take 1 tablet (10 mg total) by mouth daily.      omeprazole 20 MG capsule   Commonly known as: PRILOSEC   Take 1 capsule (20 mg total) by mouth daily.      polyethylene glycol powder powder   Commonly known as: GLYCOLAX/MIRALAX   Take 17 g by mouth daily.      promethazine 25 MG tablet   Commonly known as: PHENERGAN   Take 1 tablet (25 mg total) by mouth every 6 (six) hours as needed for nausea.      Tamsulosin HCl 0.4 MG Caps   Commonly known as: FLOMAX   Take 1 capsule (0.4 mg total) by mouth daily.      topiramate 50 MG tablet   Commonly known as: TOPAMAX         STOP taking these medications         cephALEXin 500 MG capsule      nitrofurantoin (macrocrystal-monohydrate) 100 MG capsule          Where to get your medications    These are the prescriptions that you need to pick up.   You may get these medications from any pharmacy.         HYDROcodone-acetaminophen 5-325 MG per tablet   levofloxacin 750 MG tablet   ondansetron 4 MG disintegrating tablet           Pertinent Hospital Labs  Lab 10/12/11 0500 10/11/11 0638 10/10/11 0625  WBC 6.0 6.3 7.6  HGB 12.9 12.5 12.4  HCT 39.0 37.9 37.8  PLT 271 263 288    Lab 10/12/11 0500 10/11/11 0638 10/10/11 0625 10/09/11 1242 10/06/11 1853  NA 139 136 140 -- --  K 3.8 4.0 3.1* -- --  CL 100 101 99 -- --  CO2 25 29 28  -- --  BUN 10 12 13  -- --  CREATININE 1.16* 1.00 1.05 -- --  CALCIUM 9.1 8.9 8.8 -- --  PROT -- -- 6.8 8.0 8.2  BILITOT -- -- 0.5 0.5 0.4  ALKPHOS -- -- 53 60 57  ALT -- -- 52* 58* 37*  AST -- -- 30 35 33  GLUCOSE 99 103* 105* -- --   UA: spec grav 1.030, trace ketones, 100 protein, trace LE, large hgb, loaded RBCs  Urine Cx (4/26): Enterococcus  CT ABDOMEN AND PELVIS WITHOUT CONTRAST (CT UROGRAM) 10/09/11 Comparison: Acute abdomen series of 10/06/2011. CT of 03/14/2010.  Findings: Exam is limited  for evaluation of entities other than  urinary tract calculi due to lack of oral or intravenous contrast.  Clear lung bases. Normal heart size without  pericardial or  pleural effusion. Mild hepatic steatosis. Normal uninfused  appearance of the spleen, stomach, pancreas, gallbladder, biliary  tract, adrenal glands, right kidney.  No change in a calcification within the anterior aspect of the  inter/lower pole left kidney. This measures 7 mm on image 47. No  urinary tract obstruction. No gross hydroureter or ureteric stone.  Evaluation of pelvis degraded by patient body habitus.  No retroperitoneal or retrocrural adenopathy.  Colonic stool burden suggests constipation.  Normal terminal ileum and appendix. Dystrophic calcification  within the left low anterior abdomen on image 63. Normal small  bowel without abdominal ascites.  No pelvic adenopathy. Phleboliths in the pelvis. Normal urinary  bladder and uterus. No adnexal mass. No significant free fluid.  Degenerative disc disease is advanced at the lumbosacral junction.  IMPRESSION:  1. No significant change in a inter/lower pole left renal  calcification. 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 No hydronephrosis.  2. Limited evaluation of the pelvis, secondary patient body  habitus. No convincing evidence of ureteric stone or hydroureter.  3. Mild hepatic steatosis.  4. Possible constipation.   Discharge instructions: see AVS  Condition at discharge: stable  Disposition: home  Pending Tests: none  Follow up: Follow-up Information    Follow up with [facility]. (Appointment with urology Wednesday May 8th at 3:30pm.)    Contact information:   [address] [place] [address] [phone]      Follow up with Edd Arbour, MD in 8 days. (follow up appointment on Friday May 10th at 2:45pm. )    Contact information:   [address] [place] [address] [phone]         Follow up Issues:  1. Follow up flank pain, nausea, vomiting 2. Follow up hematuria and urology referral 3. Follow up blood pressure  Marena Chancy, PGY-1 [facility] Teaching Service [phone]

## 2011-10-10 NOTE — Progress Notes (Signed)
FMTS Attending Daily Note: Denny Levy MD [phone] pager office [phone] I have discussed this patient with the resident and reviewed the assessment and plan as documented above. I agree wit the resident's findings and plan. Additionally: CT scan negative for stone--there is an apparent calyceal diverticulum that is calcified--likely teh abnl we saw on x ray. Urine looks like enterococcal infection which we are covering with IV abx. She still has pain in B flanks, but dull and not worsening.  Continue abx---CT shows fairly large stool burden so we will increase her bowel program as I think this is contributing to her pain as well. Currently stable and slightly improved.

## 2011-10-10 NOTE — Progress Notes (Signed)
FMTS Daily Intern Progress Note  Subjective: Patient doing okay this morning.  Required 8mg  of morphine yesterday. Continues to have bilateral flank pain that is dull with intermittent sharp pains.  Continues to have nausea, but no vomiting.  Able to tolerate small amount of fluids/food.  I have reviewed the patient's medications.  Objective Temp:  [97 F (36.1 C)-98 F (36.7 C)] 97.5 F (36.4 C) (04/30 0605) Pulse Rate:  [72-103] 79  (04/30 0605) Resp:  [18-19] 18  (04/30 0605) BP: (92-135)/(56-91) 135/66 mmHg (04/30 0605) SpO2:  [96 %-100 %] 100 % (04/30 0605) Weight:  [459 lb 3.1 oz (208.29 kg)-459 lb 3.2 oz (208.292 kg)] 459 lb 3.1 oz (208.29 kg) (04/30 0100)   Intake/Output Summary (Last 24 hours) at 10/10/11 0924 Last data filed at 10/10/11 0600  Gross per 24 hour  Intake   1906 ml  Output    900 ml  Net   1006 ml    CBG (last 3)  No results found for this basename: GLUCAP:3 in the last 72 hours  General: alert, cooperative, morbidly obese, NAD HEENT: AT/[place], sclera white, MMM CV: RRR, no murmur Pulm: CTAB, no wheezes Abd: +BS, soft, ND, mild TTP in RUQ Back: no tenderness to palpation Ext: no edema, palpable DP pulses Neuro: alert, oriented, moving all extremities  Labs and Imaging  Lab 10/10/11 0625 10/09/11 1242 10/06/11 1853  WBC 7.6 7.1 9.9  HGB 12.4 13.9 13.7  HCT 37.8 42.3 42.1  PLT 288 334 325     Lab 10/10/11 0625 10/09/11 1242 10/06/11 1853  NA 140 139 138  K 3.1* 3.8 3.5  CL 99 100 99  CO2 28 27 28   BUN 13 14 20   CREATININE 1.05 1.02 1.11*  LABGLOM -- -- --  GLUCOSE 105* -- --  CALCIUM 8.8 9.5 9.6   UA: spec grav 1.030, trace ketones, 100 protein, trace LE, large Hgb, loaded red cells Urine Cx: pending  CT Abd/pelvis 1. No significant change in a inter/lower pole left renal  calcification. 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 No hydronephrosis.  2. Limited evaluation of the pelvis,  secondary patient body  habitus. No convincing evidence of ureteric stone or hydroureter.  3. Mild hepatic steatosis.  4. Possible constipation.  Assessment and Plan 36 year old woman with nausea, vomiting, flank pain and UA concerning for possible kidney stone.   # UTI: Enterococcus. S/p Amp/Gent x24hrs.  Afebrile. - switch to PO levaquin this afternoon  # Flank Pain: Patient has abdominal pain, hematuria, dysuria and a calcification in a location that would indicate a kidney stone noted on an x-ray April 26. CT abd/pelvis was negative for kidney stone and other serious pathology.  May have some constipation.  Urine culture from 4/26 growing pan-sensitive enterococcus. Afebrile. Patient may have passed kidney stone prior to CT scan; however, would expect urine and pain to be improving.  May also be secondary to UTI with early ascension.  - treat UTI  - switch to Vicodin for pain control  # Nausea/vomiting: Patient admitted with intractable vomiting resistant to oral Phenergan. Also appeared mildly dehydrated with tachycardia on admission.  Tachycardia improved with IVFs.  Continued nausea, but no vomiting. - PO zofran prn   # Hypertension: Well controlled. Continue normal home medications.   # Bipolar: Stable. Continue Abilify and Seroquel.   # Morbid obesity: Continue normal diet. Will continue to address this at discharge.  FEN/GI: NS at 100 mL per  hour, regular diet PPx: SQ heparin Dispo: pending clinical improvement  BOOTH, Zavior Thomason Pager: [phone] 10/10/2011, 9:24 AM

## 2011-10-11 LAB — BASIC METABOLIC PANEL
CO2: 29 mEq/L (ref 19–32)
Calcium: 8.9 mg/dL (ref 8.4–10.5)
Chloride: 101 mEq/L (ref 96–112)
GFR calc Af Amer: 84 mL/min — ABNORMAL LOW (ref >90)
GFR calc non Af Amer: 73 mL/min — ABNORMAL LOW (ref >90)
Glucose, Bld: 103 mg/dL — ABNORMAL HIGH (ref 70–99)
Potassium: 4 mEq/L (ref 3.5–5.1)

## 2011-10-11 LAB — CBC
Platelets: 263 10*3/uL (ref 150–400)
RBC: 4.24 MIL/uL (ref 3.87–5.11)
WBC: 6.3 10*3/uL (ref 4.0–10.5)

## 2011-10-11 NOTE — Progress Notes (Signed)
FMTS Daily Intern Progress Note  Subjective: Patient still having some nausea. Used 16mg  zofran in 24hrs and 25mg  phenergan. No vomiting. Has been eating a little bit. Drank one cup of water yesterday. Still reporting pain in left flank (4/10), which is better compared to when she first came in. Pain has been controled with 4 tabs of hydrocodone/acetaminophen over 24hrs. Reports blood in urine, same as when she first came to hospital. Had bowel movement yesterday.   Objective Temp:  [97.5 F (36.4 C)-97.9 F (36.6 C)] 97.5 F (36.4 C) (05/01 0520) Pulse Rate:  [66-84] 69  (05/01 0520) Resp:  [18-20] 18  (05/01 0520) BP: (99-104)/(52-54) 102/52 mmHg (04/30 2154) SpO2:  [98 %-100 %] 98 % (05/01 0520)   Intake/Output Summary (Last 24 hours) at 10/11/11 0920 Last data filed at 10/11/11 0700  Gross per 24 hour  Intake   1642 ml  Output   1575 ml  Net     67 ml    CBG (last 3)  No results found for this basename: GLUCAP:3 in the last 72 hours  General: alert, cooperative, morbidly obese, NAD HEENT: moist mucous membranes, PERRLA CV: RRR, no murmur Pulm: CTAB, no wheezes Abd: +BS, soft, no guarding, no reproducible tenderness to palpation, no CVA tenderness Back: no tenderness to palpation Ext: no edema, palpable DP pulses Neuro: alert, oriented, moving all extremities  Labs and Imaging  Lab 10/11/11 0638 10/10/11 0625 10/09/11 1242  WBC 6.3 7.6 7.1  HGB 12.5 12.4 13.9  HCT 37.9 37.8 42.3  PLT 263 288 334     Lab 10/11/11 0638 10/10/11 0625 10/09/11 1242  NA 136 140 139  K 4.0 3.1* 3.8  CL 101 99 100  CO2 29 28 27   BUN 12 13 14   CREATININE 1.00 1.05 1.02  LABGLOM -- -- --  GLUCOSE 103* -- --  CALCIUM 8.9 8.8 9.5   UA: spec grav 1.030, trace ketones, 100 protein, trace LE, large Hgb, loaded red cells Urine Cx: pending  CT Abd/pelvis 1. No significant change in a inter/lower pole left renal  calcification. When compared to the 03/14/2010 study, favored to  be  within a calyceal diverticulum. No hydronephrosis.  2. Limited evaluation of the pelvis, secondary patient body  habitus. No convincing evidence of ureteric stone or hydroureter.  3. Mild hepatic steatosis.  4. Possible constipation.  Assessment and Plan 36 year old woman with nausea, vomiting, flank pain and UA concerning for possible kidney stone and found to have enterococcus UTI.   # UTI: Enterococcus. S/p Amp/Gent x24hrs, currently on levofloxacin day 2. Afebrile. Discharge on levofloxacin.  # Flank Pain: Patient continues having abdominal pain, although it is improving and is not reproducible on exam today. She reports persistent hematuria, which could potentially be explained by calcification in a location that would indicate a kidney stone noted on xray April 26. Abdominal pain could potentially be explained by early ascension of UTI, with urine growing pan sensitive enterococcus on 4/26.  - continue treating UTI - pain controled on vicodin  # Nausea/vomiting: continues having nausea but no vomiting. On zofran.  Po intake is still poor, but encouraged taking in fluids.  - d/c iv fluids and monitor oral intake - po zofran  # Hypokalemia: resolved. Continue potassium chloride tid.  # Hypertension: Well controlled. Continue normal home medications.   # Bipolar: Stable. Continue Abilify and Seroquel.   # Morbid obesity: Continue normal diet. Will continue to address this at discharge.  FEN/GI: NS at  100 mL per hour, regular diet PPx: SQ heparin Dispo: potentially today if she is able to take po off IV fluids.   Marena Chancy Pager: [phone] 10/11/2011, 9:20 AM

## 2011-10-11 NOTE — Progress Notes (Signed)
FMTS Attending Daily Note: Denny Levy MD [phone] pager office [phone] I  have seen and examined this patient, reviewed their chart. I have discussed this patient with the resident. I agree with the resident's findings, assessment and care plan. Will need urology consult--this can likely be done oupt.

## 2011-10-12 DIAGNOSIS — R1115 Cyclical vomiting syndrome unrelated to migraine: Secondary | ICD-10-CM | POA: Diagnosis present

## 2011-10-12 DIAGNOSIS — B952 Enterococcus as the cause of diseases classified elsewhere: Secondary | ICD-10-CM | POA: Diagnosis present

## 2011-10-12 DIAGNOSIS — N390 Urinary tract infection, site not specified: Secondary | ICD-10-CM | POA: Diagnosis present

## 2011-10-12 LAB — URINE CULTURE
Colony Count: >=100000
Culture  Setup Time: 201304300328

## 2011-10-12 LAB — BASIC METABOLIC PANEL
BUN: 10 mg/dL (ref 6–23)
Chloride: 100 mEq/L (ref 96–112)
GFR calc Af Amer: 70 mL/min — ABNORMAL LOW (ref >90)
Glucose, Bld: 99 mg/dL (ref 70–99)
Potassium: 3.8 mEq/L (ref 3.5–5.1)

## 2011-10-12 LAB — CBC
HCT: 39 % (ref 36.0–46.0)
Hemoglobin: 12.9 g/dL (ref 12.0–15.0)
RDW: 12.6 % (ref 11.5–15.5)
WBC: 6 10*3/uL (ref 4.0–10.5)

## 2011-10-12 MED ORDER — LEVOFLOXACIN 750 MG PO TABS: 750.0000 mg | ORAL_TABLET | Freq: Every day | ORAL | Status: AC

## 2011-10-12 MED ORDER — ONDANSETRON 4 MG PO TBDP: 4.0000 mg | ORAL_TABLET | Freq: Once | ORAL | Status: AC

## 2011-10-12 MED ORDER — HYDROCODONE-ACETAMINOPHEN 5-325 MG PO TABS: 1.0000 | ORAL_TABLET | ORAL | Status: AC | PRN

## 2011-10-12 NOTE — Discharge Summary (Signed)
Family Medicine Teaching Service  Discharge Note : Attending Nechemia Chiappetta MD Pager [phone] Office [phone] I have seen and examined this patient, reviewed their chart and discussed discharge planning wit the resident at the time of discharge. I agree with the discharge plan as above.  

## 2011-10-12 NOTE — Discharge Instructions (Signed)
You were in the hospital for nausea and vomiting that we think may have been caused by a urinary tract infection. You were started on antibiotics and you will need to take antibiotics at home as well. We are also going to give you some pain medication and some nausea medicine.   The blood in the urine could be due to a stone that you have had for a long time in your left kidney, but it could also be due to other things. We would like for urology to work this up some more so that we can figure out what is really causing the blood in the urine.   If you have worsening abdominal pain, fever, worsening blood in the urine, worsening feeling of dizziness or feeling faint, if you are throwing up and can't keep liquids down, it is very important that you go to the Emergency Room to be evaluated.

## 2011-10-12 NOTE — Progress Notes (Signed)
Discharged home. Home discharge instruction given , no question verbalized.Alert and oriented, ambulatory, not in any distress.

## 2011-10-20 ENCOUNTER — Inpatient Hospital Stay: Payer: Medicaid Other | Admitting: Family Medicine

## 2011-10-25 ENCOUNTER — Encounter: Payer: Self-pay | Admitting: Family Medicine

## 2011-10-25 ENCOUNTER — Ambulatory Visit (INDEPENDENT_AMBULATORY_CARE_PROVIDER_SITE_OTHER): Payer: Medicaid Other | Admitting: Family Medicine

## 2011-10-25 VITALS — BP 144/89 | HR 101 | Temp 97.5°F | Ht 71.0 in | Wt >= 6400 oz

## 2011-10-25 DIAGNOSIS — Z0000 Encounter for general adult medical examination without abnormal findings: Secondary | ICD-10-CM

## 2011-10-25 DIAGNOSIS — N390 Urinary tract infection, site not specified: Secondary | ICD-10-CM

## 2011-10-25 DIAGNOSIS — R739 Hyperglycemia, unspecified: Secondary | ICD-10-CM

## 2011-10-25 DIAGNOSIS — B49 Unspecified mycosis: Secondary | ICD-10-CM

## 2011-10-25 DIAGNOSIS — R7309 Other abnormal glucose: Secondary | ICD-10-CM

## 2011-10-25 DIAGNOSIS — F419 Anxiety disorder, unspecified: Secondary | ICD-10-CM

## 2011-10-25 DIAGNOSIS — F411 Generalized anxiety disorder: Secondary | ICD-10-CM

## 2011-10-25 DIAGNOSIS — B379 Candidiasis, unspecified: Secondary | ICD-10-CM | POA: Insufficient documentation

## 2011-10-25 DIAGNOSIS — B952 Enterococcus as the cause of diseases classified elsewhere: Secondary | ICD-10-CM

## 2011-10-25 MED ORDER — FLUCONAZOLE 150 MG PO TABS: 150.0000 mg | ORAL_TABLET | Freq: Once | ORAL | Status: AC

## 2011-10-25 NOTE — Progress Notes (Signed)
  Subjective:   Patient ID: Karen Brennan, female DOB: [birth year] 36 y.o. MRN: [id number] HPI:  1. Hospital follow up for UTI/kidney infection Synopsis: patient was treated for entercoccal UTI. She finished her 7 days of extra levaquin. She was seen at [facility], they felt CVAT and decided to continue her antibiotics. Macrobid. She is afebrile today without dysuria.  Location: left kidney (has calculi there, which may be nidus for continued infection) Onset: has been gradual and not completely resolved.  Time period of: 4 week(s).  Severity is described as mild-moderate.  Aggravating: none Alleviating: antibiotics Associated sx/sn: no dysuria. Feels she may have a yeast infection - discharge and foul smell.   2. Anxiety Synopsis: patient has been treated for bipolar disorder that was mainly depressive. She was on seroquel and clonapin, which were doing a decent job controlling her anxiety. She was recently seen by psychiatry and they DC'd these medications and started hydroxizine 25 mg daily and 50 mg in the evening.   Onset: has been chronic  Time period of:  year(s).  Severity is described as moderate to severe. Alleviating: clonapin. hydroxizine not helping.  Associated sx/sn: no suicidal, no psychotic. No homicidal. She is able to function.   3. Yeast Infection Vaginal discharge. Thick and white.  Foul smelling. S/p levaquina and IV antibiotics.  No fishy smell.  Not sexually active.  No vaginal pain.   History  Substance Use Topics  . Smoking status: Former Smoker -- 0.2 packs/day for 4 years    Types: Cigarettes    Quit date: 06/12/2004  . Smokeless tobacco: Never Used  . Alcohol Use: No    Review of Systems: Pertinent items are noted in HPI. no dysuria. Feels she may have a yeast infection - discharge and foul smell.  No fever, no chills, no night sweats. No rash.  no suicidal, no psychotic. No homicidal. She is able to function.   Labs Reviewed: last U/A negative.  Last CBC wnl Reviewed Chart Review for last notes.     Objective:   Filed Vitals:   10/25/11 1008  BP: 144/89  Pulse: 101  Temp: 97.5 F (36.4 C)  TempSrc: Oral  Height: 5\' 11"  (1.803 m)  Weight: 454 lb 9.6 oz (206.205 kg)   Physical Exam: General: obese, aaf, nad, pleasant Abdomen: soft and non-tender without masses, organomegaly or hernias noted.  No guarding or rebound Extremities:   Non-tender, No cyanosis, edema, or deformity noted. Flank: no tenderness to palpation.  Assessment & Plan:

## 2011-10-25 NOTE — Assessment & Plan Note (Signed)
Resolved. Renal follow-up still has patient on macrobid despite normal U/A because of left flank pain.

## 2011-10-25 NOTE — Patient Instructions (Signed)
I will treat you for your yeast infection.  You need to follow up sooner with psychiatry if you feel that the medications they prescribed are not working.  Good luck with your surgery!

## 2011-10-25 NOTE — Assessment & Plan Note (Signed)
Diflucan 150 mg x 2 _ obese patient.  S/p antibiotics

## 2011-10-25 NOTE — Assessment & Plan Note (Signed)
Patient had her medications changed at psychiatry two weeks ago. She is no longer taking seroquel and clonapin. She feels anxious all the time now, says her hydroxizine is not working.  Will give this time. She will follow up with psychiatry about this issue.  GAD-7 done today

## 2011-11-01 ENCOUNTER — Other Ambulatory Visit: Payer: Self-pay | Admitting: Family Medicine

## 2011-11-01 ENCOUNTER — Ambulatory Visit (INDEPENDENT_AMBULATORY_CARE_PROVIDER_SITE_OTHER): Payer: Medicaid Other | Admitting: *Deleted

## 2011-11-01 VITALS — BP 116/90 | HR 108 | Wt >= 6400 oz

## 2011-11-01 DIAGNOSIS — I10 Essential (primary) hypertension: Secondary | ICD-10-CM

## 2011-11-01 NOTE — Progress Notes (Signed)
Patient in for BP check . BP checked manually using thigh cuff ,LA 116/90. Weight 453.7 lb,   pulse 108. Plans to have bariatric surgery on  11/13/2011. She will return next week for BP check again.

## 2011-11-08 ENCOUNTER — Other Ambulatory Visit: Payer: Medicaid Other

## 2011-11-08 DIAGNOSIS — Z6841 Body Mass Index (BMI) 40.0 and over, adult: Secondary | ICD-10-CM | POA: Insufficient documentation

## 2011-11-08 DIAGNOSIS — J45909 Unspecified asthma, uncomplicated: Secondary | ICD-10-CM | POA: Insufficient documentation

## 2011-11-08 DIAGNOSIS — J40 Bronchitis, not specified as acute or chronic: Secondary | ICD-10-CM | POA: Insufficient documentation

## 2011-11-13 DIAGNOSIS — Z9884 Bariatric surgery status: Secondary | ICD-10-CM | POA: Insufficient documentation

## 2011-11-14 DIAGNOSIS — D62 Acute posthemorrhagic anemia: Secondary | ICD-10-CM | POA: Insufficient documentation

## 2011-11-20 ENCOUNTER — Emergency Department (HOSPITAL_COMMUNITY)
Admission: EM | Admit: 2011-11-20 | Discharge: 2011-11-21 | Disposition: A | Discharge status: Home / Self Care | Payer: Medicaid Other | Attending: Emergency Medicine | Admitting: Emergency Medicine

## 2011-11-20 DIAGNOSIS — I10 Essential (primary) hypertension: Secondary | ICD-10-CM | POA: Insufficient documentation

## 2011-11-20 DIAGNOSIS — N390 Urinary tract infection, site not specified: Secondary | ICD-10-CM

## 2011-11-20 DIAGNOSIS — Z87891 Personal history of nicotine dependence: Secondary | ICD-10-CM | POA: Insufficient documentation

## 2011-11-20 DIAGNOSIS — Z79899 Other long term (current) drug therapy: Secondary | ICD-10-CM | POA: Insufficient documentation

## 2011-11-20 DIAGNOSIS — K219 Gastro-esophageal reflux disease without esophagitis: Secondary | ICD-10-CM | POA: Insufficient documentation

## 2011-11-20 DIAGNOSIS — F341 Dysthymic disorder: Secondary | ICD-10-CM | POA: Insufficient documentation

## 2011-11-20 DIAGNOSIS — F319 Bipolar disorder, unspecified: Secondary | ICD-10-CM | POA: Insufficient documentation

## 2011-11-20 DIAGNOSIS — E860 Dehydration: Secondary | ICD-10-CM | POA: Insufficient documentation

## 2011-11-21 ENCOUNTER — Emergency Department (HOSPITAL_COMMUNITY): Payer: Medicaid Other

## 2011-11-21 ENCOUNTER — Encounter (HOSPITAL_COMMUNITY): Payer: Self-pay | Admitting: *Deleted

## 2011-11-21 ENCOUNTER — Other Ambulatory Visit: Payer: Self-pay

## 2011-11-21 LAB — CBC
HCT: 37.9 % (ref 36.0–46.0)
Hemoglobin: 12.9 g/dL (ref 12.0–15.0)
MCHC: 34 g/dL (ref 30.0–36.0)

## 2011-11-21 LAB — URINALYSIS, ROUTINE W REFLEX MICROSCOPIC
Ketones, ur: >80 mg/dL — AB
Nitrite: NEGATIVE
Protein, ur: 100 mg/dL — AB

## 2011-11-21 LAB — COMPREHENSIVE METABOLIC PANEL
BUN: 8 mg/dL (ref 6–23)
CO2: 27 mEq/L (ref 19–32)
Chloride: 96 mEq/L (ref 96–112)
Creatinine, Ser: 0.79 mg/dL (ref 0.50–1.10)
GFR calc Af Amer: >90 mL/min (ref >90)
GFR calc non Af Amer: >90 mL/min (ref >90)
Glucose, Bld: 82 mg/dL (ref 70–99)
Total Bilirubin: 0.6 mg/dL (ref 0.3–1.2)

## 2011-11-21 LAB — DIFFERENTIAL
Basophils Relative: 1 % (ref 0–1)
Lymphocytes Relative: 32 % (ref 12–46)
Lymphs Abs: 2.6 10*3/uL (ref 0.7–4.0)
Monocytes Absolute: 0.6 10*3/uL (ref 0.1–1.0)
Monocytes Relative: 7 % (ref 3–12)
Neutro Abs: 4.7 10*3/uL (ref 1.7–7.7)

## 2011-11-21 LAB — TROPONIN I: Troponin I: <0.3 ng/mL (ref <0.30)

## 2011-11-21 MED ORDER — DEXTROSE 5 % IV SOLN
1.0000 g | Freq: Once | INTRAVENOUS | Status: AC
  Administered 2011-11-21: 1 g via INTRAVENOUS
  Filled 2011-11-21: qty 10

## 2011-11-21 MED ORDER — SODIUM CHLORIDE 0.9 % IV SOLN
Freq: Once | INTRAVENOUS | Status: AC
  Administered 2011-11-21: 04:00:00 via INTRAVENOUS

## 2011-11-21 MED ORDER — CEPHALEXIN 250 MG/5ML PO SUSR: 500.0000 mg | Freq: Four times a day (QID) | ORAL | Status: AC

## 2011-11-21 MED ORDER — LORAZEPAM 2 MG/ML IJ SOLN
1.0000 mg | Freq: Once | INTRAMUSCULAR | Status: AC
  Administered 2011-11-21: 1 mg via INTRAVENOUS
  Filled 2011-11-21: qty 1

## 2011-11-21 MED ORDER — NYSTATIN 100000 UNIT/ML MT SUSP: 500000.0000 [IU] | Freq: Four times a day (QID) | OROMUCOSAL | Status: AC

## 2011-11-21 MED ORDER — SODIUM CHLORIDE 0.9 % IV BOLUS (SEPSIS)
1000.0000 mL | Freq: Once | INTRAVENOUS | Status: AC
  Administered 2011-11-21: 1000 mL via INTRAVENOUS

## 2011-11-21 NOTE — ED Provider Notes (Signed)
History     CSN: [id number]  Arrival date & time 11/20/11  2333   First MD Initiated Contact with Patient 11/21/11 0200      Chief Complaint  Patient presents with  . multiple complaints     (Consider location/radiation/quality/duration/timing/severity/associated sxs/prior treatment) HPI Comments: 36 year old female who presents with a complaint of lightheadedness and dizziness. She states this started approximately one week ago after surgery where she had a gastric bypass surgery performed at this hospital. She states that since that time she has had increased lightheadedness, dizziness, fatigue and generalized weakness. She has been unable to eat or drink very much bye volume secondary to her surgery and feels like she is not making very much urine. In addition she feels like she has had blood in her urine and some difficulty urinating with dysuria. She denies fevers chills coughing but does have mild shortness of breath. Symptoms are persistent, gradually getting worse, worse with standing, better with sitting and laying down.  The history is provided by the patient and medical records.    Past Medical History  Diagnosis Date  . Hypertension   . ANEMIA 08/14/2008    Qualifier: Diagnosis of  By: Karn Pickler MD, Shanda Bumps    . Bipolar 1 disorder   . Kidney stones 10/09/11  . History of bronchitis   . Blood transfusion   . Migraines 10/09/11    "often"  . GERD (gastroesophageal reflux disease)   . Anxiety   . Depression     Past Surgical History  Procedure Date  . Endometrial ablation ~ 2011  . Tubal ligation 2006  . Fracture surgery   . Ankle fracture surgery  1990's    "left; put screws in"    No family history on file.  History  Substance Use Topics  . Smoking status: Former Smoker -- 0.2 packs/day for 4 years    Types: Cigarettes    Quit date: 06/12/2004  . Smokeless tobacco: Never Used  . Alcohol Use: No    OB History    Grav Para Term Preterm Abortions TAB SAB Ect  Mult Living                  Review of Systems  All other systems reviewed and are negative.    Allergies  Sibutramine hcl monohydrate and Sibutramine  Home Medications   Current Outpatient Rx  Name Route Sig Dispense Refill  . CITALOPRAM HYDROBROMIDE 40 MG PO TABS Oral Take 40 mg by mouth daily.    Marland Kitchen DOCUSATE SODIUM 100 MG PO CAPS Oral Take 100 mg by mouth 2 (two) times daily.    Marland Kitchen FLUTICASONE PROPIONATE 50 MCG/ACT NA SUSP Nasal Place 2 sprays into the nose daily. 16 g 6  . HYDROXYZINE HCL 25 MG PO TABS Oral Take 25 mg by mouth 2 (two) times daily. 1 tablet in the morning and a 2 tablets at bedtime    . LISINOPRIL 5 MG PO TABS  TAKE ONE TABLET BY MOUTH DAILY 30 tablet 11    RX SIG: TAKE ONE TABLET BY MOUTH DAILY  . LORATADINE 10 MG PO TABS Oral Take 1 tablet (10 mg total) by mouth daily. 30 tablet 5  . MINERAL OIL RE ENEM Rectal Place 1 enema rectally every 4 (four) hours as needed. As needed for constipation.    Marland Kitchen OMEPRAZOLE 20 MG PO CPDR Oral Take 1 capsule (20 mg total) by mouth daily. 30 capsule 11  . POLYETHYLENE GLYCOL 3350 PO POWD Oral Take  17 g by mouth daily. 527 g 6  . PROMETHAZINE HCL 25 MG PO TABS Oral Take 25 mg by mouth every 6 (six) hours as needed. nausea    . QUETIAPINE FUMARATE ER 150 MG PO TB24 Oral Take 300 mg by mouth at bedtime.    . TAMSULOSIN HCL 0.4 MG PO CAPS Oral Take 1 capsule (0.4 mg total) by mouth daily. 7 capsule 0  . CEPHALEXIN 250 MG/5ML PO SUSR Oral Take 10 mLs (500 mg total) by mouth 4 (four) times daily. 400 mL 0    BP 115/67  Pulse 82  Temp(Src) 98.4 F (36.9 C) (Oral)  Resp 20  SpO2 98%  Physical Exam  Nursing note and vitals reviewed. Constitutional: She appears well-developed and well-nourished. No distress.  HENT:  Head: Normocephalic and atraumatic.  Mouth/Throat: No oropharyngeal exudate.       Mucous membranes dry  Eyes: Conjunctivae and EOM are normal. Pupils are equal, round, and reactive to light. Right eye exhibits no  discharge. Left eye exhibits no discharge. No scleral icterus.  Neck: Normal range of motion. Neck supple. No JVD present. No thyromegaly present.  Cardiovascular: Normal rate, regular rhythm, normal heart sounds and intact distal pulses.  Exam reveals no gallop and no friction rub.   No murmur heard. Pulmonary/Chest: Effort normal and breath sounds normal. No respiratory distress. She has no wheezes. She has no rales.  Abdominal: Soft. Bowel sounds are normal. She exhibits no distension and no mass. There is no tenderness.       Incision sites are clean dry and intact, minimal tenderness surrounding the incision sites, no other tenderness.  Musculoskeletal: Normal range of motion. She exhibits no edema and no tenderness.  Lymphadenopathy:    She has no cervical adenopathy.  Neurological: She is alert. Coordination normal.  Skin: Skin is warm and dry. No rash noted. No erythema.  Psychiatric: She has a normal mood and affect. Her behavior is normal.    ED Course  Procedures (including critical care time)  Labs Reviewed  COMPREHENSIVE METABOLIC PANEL - Abnormal; Notable for the following:    Potassium 3.4 (*)    ALT 42 (*)    All other components within normal limits  URINALYSIS, ROUTINE W REFLEX MICROSCOPIC - Abnormal; Notable for the following:    Color, Urine RED (*) BIOCHEMICALS MAY BE AFFECTED BY COLOR   APPearance TURBID (*)    Hgb urine dipstick LARGE (*)    Bilirubin Urine MODERATE (*)    Ketones, ur >80 (*)    Protein, ur 100 (*)    Leukocytes, UA MODERATE (*)    All other components within normal limits  URINE MICROSCOPIC-ADD ON - Abnormal; Notable for the following:    Squamous Epithelial / LPF MANY (*)    Bacteria, UA MANY (*)    All other components within normal limits  CBC  DIFFERENTIAL  TROPONIN I  URINE CULTURE   Dg Chest 2 View  11/21/2011  *RADIOLOGY REPORT*  Clinical Data: Shortness of breath  CHEST - 2 VIEW  Comparison: 08/12/2006  Findings: Heart size  and mediastinal contours within normal limits. Lungs are predominately clear.  No pleural effusion or pneumothorax.  No acute osseous abnormality.  IMPRESSION: No radiographic evidence of acute cardiopulmonary process.  Original Report Authenticated By: Waneta Martins, M.D.     1. Dehydration   2. UTI (lower urinary tract infection)       MDM  Currently the patient has normal vital signs but  she does have a slight hypoxia at 95%, her lung sounds are clear and her x-ray shows no signs of pulmonary disease. Blood counts are normal, no anemia or leukocytosis. She does appear clinically dehydrated and will require IV fluids. Urinalysis at the bedside appears hyper concentrated and possibly hematuric.  ED ECG REPORT   Date: 11/21/2011   Rate: 89  Rhythm: normal sinus rhythm  QRS Axis: normal  Intervals: normal  ST/T Wave abnormalities: normal  Conduction Disutrbances:none  Narrative Interpretation:   Old EKG Reviewed: since 01/19/2010,No significant changes. QTC is 489, on prior EKG was 476.  Pt informed of results, given IV abx and fluids and is tolerating PO fluids - home with meds.  Pt understands d/c instructions.   Discharge Prescriptions include:  Keflex   Vida Roller, MD 11/21/11 [phone]

## 2011-11-21 NOTE — ED Notes (Signed)
The pt has a gastric by-pass oin June 3rd at [facility]. Today she has had numbness and tingling all over her body .  She feelslike she cannot get her breath she is dizzy  She has had blood in her urine today and she feel dehydrated

## 2011-11-21 NOTE — Discharge Instructions (Signed)
See your doctor in 24 hours for a recheck.  After surgery such as gastric bypass it is easy to get dehydrated so drink plenty of fluids and monitor your urine - keep it clear like water.  Antibiotics as prescribed and return to ER for severe pain, vomiting or fevers.

## 2011-11-21 NOTE — ED Notes (Signed)
Dr. Hyacinth Meeker aware of pt's request for anxiety medication.

## 2011-11-21 NOTE — ED Notes (Signed)
Pt states she has had numbness tingling to legs and arms. Blood in urine. Dizziness. Pt has white coating to to tongue. Pt states she is unable to eat prescribed diet. States intermediate sensation of not being able to fill her lungs.

## 2011-11-21 NOTE — ED Notes (Signed)
RN Suzy Bouchard. Informed of patient's statements, "My legs are numb!  Why would they be numb?  I am peeing on myself because my legs are numb!"

## 2011-11-22 LAB — URINE CULTURE
Colony Count: >=100000
Culture  Setup Time: 201306110418

## 2011-11-23 ENCOUNTER — Ambulatory Visit: Payer: Medicaid Other | Admitting: Family Medicine

## 2011-11-24 ENCOUNTER — Encounter (HOSPITAL_COMMUNITY): Payer: Self-pay | Admitting: Emergency Medicine

## 2011-11-24 ENCOUNTER — Emergency Department (HOSPITAL_COMMUNITY)
Admission: EM | Admit: 2011-11-24 | Discharge: 2011-11-25 | Disposition: A | Discharge status: Home / Self Care | Payer: Medicaid Other | Attending: Emergency Medicine | Admitting: Emergency Medicine

## 2011-11-24 DIAGNOSIS — R1013 Epigastric pain: Secondary | ICD-10-CM

## 2011-11-24 DIAGNOSIS — Z9884 Bariatric surgery status: Secondary | ICD-10-CM | POA: Insufficient documentation

## 2011-11-24 NOTE — ED Notes (Signed)
PT. REPORTS MID ABDOMINAL PAIN " BURNING" ONSET TODAY , DENIES NAUSEA , VOMITTING OR DIARRHEA. NO FEVER OR CHILLS. STATES GASTRIC BYPASS SURGERY LAST June 3 BY DR. Lily Peer AT [facility].

## 2011-11-25 ENCOUNTER — Emergency Department (HOSPITAL_COMMUNITY): Payer: Medicaid Other

## 2011-11-25 LAB — DIFFERENTIAL
Basophils Absolute: 0 10*3/uL (ref 0.0–0.1)
Basophils Relative: 0 % (ref 0–1)
Eosinophils Absolute: 0.2 10*3/uL (ref 0.0–0.7)
Monocytes Absolute: 0.6 10*3/uL (ref 0.1–1.0)
Monocytes Relative: 6 % (ref 3–12)
Neutro Abs: 5.9 10*3/uL (ref 1.7–7.7)

## 2011-11-25 LAB — CBC
HCT: 37.5 % (ref 36.0–46.0)
Hemoglobin: 12.5 g/dL (ref 12.0–15.0)
MCH: 29.1 pg (ref 26.0–34.0)
MCHC: 33.3 g/dL (ref 30.0–36.0)
RDW: 12.5 % (ref 11.5–15.5)

## 2011-11-25 LAB — URINALYSIS, ROUTINE W REFLEX MICROSCOPIC
Ketones, ur: 15 mg/dL — AB
Nitrite: NEGATIVE
Protein, ur: NEGATIVE mg/dL
pH: 6 (ref 5.0–8.0)

## 2011-11-25 LAB — URINE MICROSCOPIC-ADD ON

## 2011-11-25 LAB — COMPREHENSIVE METABOLIC PANEL
AST: 50 U/L — ABNORMAL HIGH (ref 0–37)
Albumin: 3.7 g/dL (ref 3.5–5.2)
BUN: 11 mg/dL (ref 6–23)
Calcium: 9.2 mg/dL (ref 8.4–10.5)
Creatinine, Ser: 1.03 mg/dL (ref 0.50–1.10)
Total Protein: 7.5 g/dL (ref 6.0–8.3)

## 2011-11-25 LAB — LIPASE, BLOOD: Lipase: 38 U/L (ref 11–59)

## 2011-11-25 MED ORDER — IOHEXOL 300 MG/ML  SOLN
20.0000 mL | INTRAMUSCULAR | Status: AC
  Administered 2011-11-25: 20 mL via ORAL

## 2011-11-25 MED ORDER — IOHEXOL 300 MG/ML  SOLN
100.0000 mL | Freq: Once | INTRAMUSCULAR | Status: AC | PRN
  Administered 2011-11-25: 100 mL via INTRAVENOUS

## 2011-11-25 MED ORDER — LORAZEPAM 1 MG PO TABS
1.0000 mg | ORAL_TABLET | Freq: Once | ORAL | Status: AC
  Administered 2011-11-25: 1 mg via ORAL
  Filled 2011-11-25: qty 1

## 2011-11-25 MED ORDER — SODIUM CHLORIDE 0.9 % IV SOLN
INTRAVENOUS | Status: DC
  Administered 2011-11-25: 06:00:00 via INTRAVENOUS

## 2011-11-25 NOTE — ED Notes (Signed)
Pt wanting to leave she has to get her children.  Waiting for c-t report

## 2011-11-25 NOTE — Discharge Instructions (Signed)

## 2011-11-25 NOTE — ED Provider Notes (Signed)
History     CSN: [id number]  Arrival date & time 11/24/11  2332   First MD Initiated Contact with Patient 11/25/11 0202      Chief Complaint  Patient presents with  . Abdominal Pain    (Consider location/radiation/quality/duration/timing/severity/associated sxs/prior treatment) HPI History provided by patient. Had gastric bypass surgery about 2 weeks ago. Tonight having epigastric burning discomfort and worried that there may be a problem with her surgery. No fevers. No vomiting. No diarrhea. No blood in stools. No history of similar symptoms. Patient called her surgeon at [facility] and was told to come to the emergency department tonight. Symptoms moderate in severity. She declines any pain medications. No heartburn or sour taste in her mouth. No belching. Past Medical History  Diagnosis Date  . Hypertension   . ANEMIA 08/14/2008    Qualifier: Diagnosis of  By: Karn Pickler MD, Shanda Bumps    . Bipolar 1 disorder   . Kidney stones 10/09/11  . History of bronchitis   . Blood transfusion   . Migraines 10/09/11    "often"  . GERD (gastroesophageal reflux disease)   . Anxiety   . Depression   . Obesity     Past Surgical History  Procedure Date  . Endometrial ablation ~ 2011  . Tubal ligation 2006  . Fracture surgery   . Ankle fracture surgery  1990's    "left; put screws in"  . Gastric by pass     No family history on file.  History  Substance Use Topics  . Smoking status: Former Smoker -- 0.2 packs/day for 4 years    Types: Cigarettes    Quit date: 06/12/2004  . Smokeless tobacco: Never Used  . Alcohol Use: No    OB History    Grav Para Term Preterm Abortions TAB SAB Ect Mult Living                  Review of Systems  Constitutional: Negative for fever and chills.  HENT: Negative for neck pain and neck stiffness.   Eyes: Negative for pain.  Respiratory: Negative for shortness of breath.   Cardiovascular: Negative for chest pain.  Gastrointestinal: Positive  for abdominal pain. Negative for vomiting and abdominal distention.  Genitourinary: Negative for dysuria.  Musculoskeletal: Negative for back pain.  Skin: Negative for rash.  Neurological: Negative for headaches.  All other systems reviewed and are negative.    Allergies  Sibutramine hcl monohydrate and Sibutramine  Home Medications   Current Outpatient Rx  Name Route Sig Dispense Refill  . CEPHALEXIN 250 MG/5ML PO SUSR Oral Take 500 mg by mouth 4 (four) times daily. For 10 days beginning 11/21/11    . CITALOPRAM HYDROBROMIDE 40 MG PO TABS Oral Take 40 mg by mouth daily.    Marland Kitchen DOCUSATE SODIUM 100 MG PO CAPS Oral Take 100 mg by mouth 2 (two) times daily.    Marland Kitchen FLUTICASONE PROPIONATE 50 MCG/ACT NA SUSP Nasal Place 2 sprays into the nose daily. 16 g 6  . HYDROXYZINE HCL 25 MG PO TABS Oral Take 25 mg by mouth 2 (two) times daily. 1 tablet in the morning and a 2 tablets at bedtime    . LORATADINE 10 MG PO TABS Oral Take 1 tablet (10 mg total) by mouth daily. 30 tablet 5  . MINERAL OIL RE ENEM Rectal Place 1 enema rectally daily as needed. As needed for constipation.    . NYSTATIN 100000 UNIT/ML MT SUSP Oral Take 5 mLs (500,000 Units  total) by mouth 4 (four) times daily. 240 mL 0    Swish and spit  . OLANZAPINE 10 MG PO TABS Oral Take 10 mg by mouth daily.    Marland Kitchen OMEPRAZOLE 20 MG PO CPDR Oral Take 1 capsule (20 mg total) by mouth daily. 30 capsule 11  . POLYETHYLENE GLYCOL 3350 PO POWD Oral Take 17 g by mouth daily. 527 g 6  . PROMETHAZINE HCL 25 MG PO TABS Oral Take 25 mg by mouth every 6 (six) hours as needed. nausea    . TAMSULOSIN HCL 0.4 MG PO CAPS Oral Take 1 capsule (0.4 mg total) by mouth daily. 7 capsule 0  . CEPHALEXIN 250 MG/5ML PO SUSR Oral Take 10 mLs (500 mg total) by mouth 4 (four) times daily. 400 mL 0    BP 120/87  Pulse 84  Temp 98.8 F (37.1 C) (Oral)  Resp 18  SpO2 99%  Physical Exam  Constitutional: She is oriented to person, place, and time. She appears  well-developed and well-nourished.  HENT:  Head: Normocephalic and atraumatic.  Eyes: Conjunctivae and EOM are normal. Pupils are equal, round, and reactive to light.  Neck: Trachea normal. Neck supple. No thyromegaly present.  Cardiovascular: Normal rate, regular rhythm, S1 normal, S2 normal and normal pulses.     No systolic murmur is present   No diastolic murmur is present  Pulses:      Radial pulses are 2+ on the right side, and 2+ on the left side.  Pulmonary/Chest: Effort normal and breath sounds normal. She has no wheezes. She has no rhonchi. She has no rales. She exhibits no tenderness.  Abdominal: Soft. Normal appearance and bowel sounds are normal. There is no CVA tenderness and negative Murphy's sign.       Abuse. Well healing surgical scars the abdomen. Mild epigastric tenderness. No right upper quadrant tenderness with negative Murphy's sign.  Musculoskeletal:       BLE:s Calves nontender, no cords or erythema, negative Homans sign  Neurological: She is alert and oriented to person, place, and time. She has normal strength. No cranial nerve deficit or sensory deficit. GCS eye subscore is 4. GCS verbal subscore is 5. GCS motor subscore is 6.  Skin: Skin is warm and dry. No rash noted. She is not diaphoretic.  Psychiatric: Her speech is normal.       Cooperative and appropriate    ED Course  Procedures (including critical care time)  Labs Reviewed  URINALYSIS, ROUTINE W REFLEX MICROSCOPIC - Abnormal; Notable for the following:    Color, Urine AMBER (*)  BIOCHEMICALS MAY BE AFFECTED BY COLOR   APPearance CLOUDY (*)     Hgb urine dipstick LARGE (*)     Bilirubin Urine SMALL (*)     Ketones, ur 15 (*)     Leukocytes, UA SMALL (*)     All other components within normal limits  COMPREHENSIVE METABOLIC PANEL - Abnormal; Notable for the following:    Glucose, Bld 118 (*)     AST 50 (*)     ALT 57 (*)     GFR calc non Af Amer 70 (*)     GFR calc Af Amer 81 (*)     All  other components within normal limits  URINE MICROSCOPIC-ADD ON - Abnormal; Notable for the following:    Squamous Epithelial / LPF MANY (*)     All other components within normal limits  CBC  DIFFERENTIAL  LIPASE, BLOOD   Ct  Abdomen Pelvis W Contrast  11/25/2011  *RADIOLOGY REPORT*  Clinical Data: Abdominal pain; recent gastric bypass surgery.  CT ABDOMEN AND PELVIS WITH CONTRAST  Technique:  Multidetector CT imaging of the abdomen and pelvis was performed following the standard protocol during bolus administration of intravenous contrast.  Contrast: 100 mL of Omnipaque 300 IV contrast  Comparison: CT of the abdomen and pelvis performed 10/09/2011  Findings: The visualized lung bases are clear.  The patient's gastric bypass is grossly unremarkable in appearance. No associated soft tissue inflammation is seen.  The gastrojejunal anastomosis is unremarkable in appearance.  A small amount of residual fluid is noted within the distal stomach; the duodenum is largely decompressed.  The liver and spleen are unremarkable in appearance.  The gallbladder is within normal limits.  The pancreas and adrenal glands are unremarkable.  A 9 mm calcification at the interpole region of the left kidney may be within the parenchyma or possibly in a left renal calyx. The kidneys are otherwise unremarkable in appearance.  There is no evidence of hydronephrosis.  No obstructing ureteral stones are seen.  No perinephric stranding is appreciated.  No free fluid is identified.  The small bowel is unremarkable in appearance.  The stomach is within normal limits.  No acute vascular abnormalities are seen.  The appendix is normal in caliber and contains contrast, without evidence for appendicitis.  Contrast progresses to the level of the sigmoid colon; the colon is unremarkable in appearance.  The bladder is decompressed and not well assessed.  The uterus is grossly unremarkable in appearance.  The ovaries are relatively symmetric; no  suspicious adnexal masses are seen.  A right-sided tubal ligation clip is noted.  The patient's left-sided tubal ligation clip has migrated superiorly to the level of the umbilicus.  No inguinal lymphadenopathy is seen.  No acute osseous abnormalities are identified.  Mild degenerative change is noted at L5-S1, with endplate irregularity and vacuum phenomenon.  IMPRESSION:  1.  Gastric bypass is unremarkable in appearance; no associated soft tissue inflammation seen.  Gastrojejunal anastomosis appears patent. 2.  Note that the left-sided tubal ligation clip has migrated superiorly to the level of the umbilicus; the right-sided tubal ligation clip remains in expected position.  Further evaluation and repositioning is recommended. 3.  9 mm calcification at the interpole region of the left kidney may be within the parenchyma or possibly in a left renal calyx.  No ureteral stones seen. 4.  Mild degenerative change at L5-S1, with endplate irregularity and vacuum phenomenon, but no significant disc protrusion.  Original Report Authenticated By: Tonia Ghent, M.D.   N.p.o. IV fluids.  Patient evaluated with labs and CT scan - gastric bypass protocol. MDM   Abdominal and epigastric pain with labs and imaging obtained and reviewed as above. Serial evaluations declines pain medications. No acute findings on CT scan. CT scan results shared with patient and she agrees to OB/GYN followup and keep scheduled followup with her surgeon. Stable for discharge home, declines prescriptions.        Sunnie Nielsen, MD 11/25/11 [phone]

## 2011-11-25 NOTE — ED Notes (Signed)
The pt asking for something for her anxiety

## 2011-11-25 NOTE — ED Notes (Signed)
The pt has had upper abd pain since yesterday.  No nv or diarrhea.  The pt had gastric bypass at [facility] on the 3rd of June.  She has been here one other time since the surgery for abd pain

## 2011-11-25 NOTE — ED Notes (Signed)
Up to br.  No complaints

## 2011-11-25 NOTE — ED Notes (Signed)
Pt drinking oral contrast.

## 2011-11-25 NOTE — ED Notes (Signed)
Pt still waiting for c-t 

## 2011-12-05 ENCOUNTER — Other Ambulatory Visit: Payer: Self-pay | Admitting: Family Medicine

## 2012-01-26 ENCOUNTER — Other Ambulatory Visit: Payer: Self-pay | Admitting: Family Medicine

## 2012-03-30 ENCOUNTER — Emergency Department (HOSPITAL_COMMUNITY)
Admission: EM | Admit: 2012-03-30 | Discharge: 2012-03-30 | Disposition: A | Discharge status: Home / Self Care | Payer: Medicaid Other | Attending: Emergency Medicine | Admitting: Emergency Medicine

## 2012-03-30 ENCOUNTER — Encounter (HOSPITAL_COMMUNITY): Payer: Self-pay | Admitting: Emergency Medicine

## 2012-03-30 DIAGNOSIS — F341 Dysthymic disorder: Secondary | ICD-10-CM | POA: Insufficient documentation

## 2012-03-30 DIAGNOSIS — M545 Low back pain, unspecified: Secondary | ICD-10-CM

## 2012-03-30 DIAGNOSIS — I10 Essential (primary) hypertension: Secondary | ICD-10-CM | POA: Insufficient documentation

## 2012-03-30 DIAGNOSIS — F319 Bipolar disorder, unspecified: Secondary | ICD-10-CM | POA: Insufficient documentation

## 2012-03-30 LAB — URINALYSIS, ROUTINE W REFLEX MICROSCOPIC
Bilirubin Urine: NEGATIVE
Ketones, ur: 15 mg/dL — AB
Nitrite: NEGATIVE
Protein, ur: NEGATIVE mg/dL
Urobilinogen, UA: 0.2 mg/dL (ref 0.0–1.0)
pH: 5.5 (ref 5.0–8.0)

## 2012-03-30 MED ORDER — HYDROCODONE-ACETAMINOPHEN 5-325 MG PO TABS: 2.0000 | ORAL_TABLET | ORAL | Status: DC | PRN

## 2012-03-30 MED ORDER — IBUPROFEN 600 MG PO TABS: 600.0000 mg | ORAL_TABLET | Freq: Three times a day (TID) | ORAL | Status: DC

## 2012-03-30 NOTE — ED Notes (Signed)
Pt c/o left sided flank and mid back pain x several days that thinks could be kidney infection

## 2012-03-30 NOTE — ED Provider Notes (Signed)
I saw and evaluated the patient, reviewed the resident's note and I agree with the findings and plan.  Pt with L lower back pain, worse with movement, concerned about UTI, but UA neg. Advised pain meds and PCP followup.   Tameah Mihalko B. Bernette Mayers, MD 03/30/12 1642

## 2012-03-30 NOTE — ED Notes (Signed)
C/o left flank pain, urinary frequency x 1 week. Denies dysuria, fever, n/v. Reports pain non radiating, worsens with certain mvmts.

## 2012-03-30 NOTE — ED Provider Notes (Signed)
History     CSN: [id number]  Arrival date & time 03/30/12  1305   None     Chief Complaint  Patient presents with  . Flank Pain  . Back Pain    (Consider location/radiation/quality/duration/timing/severity/associated sxs/prior treatment) Patient is a 36 y.o. female presenting with back pain. The history is provided by the patient.  Back Pain  This is a recurrent problem. The current episode started more than 1 week ago. The problem occurs constantly. The problem has not changed since onset.The pain is associated with no known injury. The pain is present in the sacro-iliac joint. The quality of the pain is described as aching. The pain does not radiate. The pain is moderate. The symptoms are aggravated by bending and certain positions. The pain is the same all the time. Pertinent negatives include no chest pain, no fever, no numbness, no weight loss, no headaches, no abdominal pain, no abdominal swelling, no bowel incontinence, no perianal numbness, no bladder incontinence, no pelvic pain, no leg pain, no paresthesias, no paresis, no tingling and no weakness. Dysuria: increased frequency.    Past Medical History  Diagnosis Date  . Hypertension   . ANEMIA 08/14/2008    Qualifier: Diagnosis of  By: Karn Pickler MD, Shanda Bumps    . Bipolar 1 disorder   . Kidney stones 10/09/11  . History of bronchitis   . Blood transfusion   . Migraines 10/09/11    "often"  . GERD (gastroesophageal reflux disease)   . Anxiety   . Depression   . Obesity     Past Surgical History  Procedure Date  . Endometrial ablation ~ 2011  . Tubal ligation 2006  . Fracture surgery   . Ankle fracture surgery  1990's    "left; put screws in"  . Gastric by pass     History reviewed. No pertinent family history.  History  Substance Use Topics  . Smoking status: Former Smoker -- 0.2 packs/day for 4 years    Types: Cigarettes    Quit date: 06/12/2004  . Smokeless tobacco: Never Used  . Alcohol Use: No    OB  History    Grav Para Term Preterm Abortions TAB SAB Ect Mult Living                  Review of Systems  Constitutional: Negative for fever and weight loss.  Respiratory: Negative for cough and shortness of breath.   Cardiovascular: Negative for chest pain.  Gastrointestinal: Negative for nausea, abdominal pain, diarrhea and bowel incontinence.  Genitourinary: Negative for bladder incontinence and pelvic pain. Dysuria: increased frequency.  Musculoskeletal: Positive for back pain.  Neurological: Negative for tingling, weakness, numbness, headaches and paresthesias.  All other systems reviewed and are negative.    Allergies  Sibutramine hcl monohydrate  Home Medications   Current Outpatient Rx  Name Route Sig Dispense Refill  . CALCIUM PO Oral Take 1 tablet by mouth 3 (three) times daily.    Marland Kitchen CITALOPRAM HYDROBROMIDE 40 MG PO TABS Oral Take 40 mg by mouth daily.    Marland Kitchen HYDROXYZINE HCL 25 MG PO TABS Oral Take 25 mg by mouth 4 (four) times daily as needed. itching    . IRON PO Oral Take 1 tablet by mouth daily.    . ADULT MULTIVITAMIN W/MINERALS CH Oral Take 1 tablet by mouth 3 (three) times daily.    Marland Kitchen OMEPRAZOLE 20 MG PO CPDR Oral Take 1 capsule (20 mg total) by mouth daily. 30 capsule 11  .  POLYETHYLENE GLYCOL 3350 PO PACK Oral Take 17 g by mouth daily as needed. Constipation    . URSODIOL 300 MG PO CAPS Oral Take 300 mg by mouth 2 (two) times daily.      BP 126/94  Pulse 113  Temp 97.6 F (36.4 C) (Oral)  Resp 18  SpO2 97%  Physical Exam  Nursing note and vitals reviewed. Constitutional: She is oriented to person, place, and time. She appears well-developed and well-nourished. No distress.       Morbidly obese  HENT:  Head: Normocephalic and atraumatic.  Eyes: EOM are normal. Pupils are equal, round, and reactive to light.  Neck: Normal range of motion.  Cardiovascular: Normal rate and normal heart sounds.   Pulmonary/Chest: Effort normal and breath sounds normal. No  respiratory distress.  Abdominal: Soft. She exhibits no distension. There is no tenderness.  Musculoskeletal: Normal range of motion.  Neurological: She is alert and oriented to person, place, and time.  Skin: Skin is warm and dry.       ED Course  Procedures (including critical care time)  Labs Reviewed  URINALYSIS, ROUTINE W REFLEX MICROSCOPIC - Abnormal; Notable for the following:    Color, Urine AMBER (*)  BIOCHEMICALS MAY BE AFFECTED BY COLOR   APPearance CLOUDY (*)     Specific Gravity, Urine 1.040 (*)     Ketones, ur 15 (*)     All other components within normal limits   No results found.   1. Low back pain       MDM  3:23 PM Pt seen and examined. Pt with about a week of bilateral SI pain. Doubt UTI, but u/a shows ketones. Will check CBG as patient is morbidly obese with recent bypass surgery and has not had blood work for around 4 months. Otherwise will treat for SI pain.  4:55 PM CBG normal. Will discharge will small amount of pain medicine and course of motrin. Pt advised to follow with PCP in a week if symptoms have not resolved.         Daleen Bo, MD 03/30/12 2350

## 2012-05-29 ENCOUNTER — Encounter: Payer: Self-pay | Admitting: Family Medicine

## 2012-05-29 DIAGNOSIS — N200 Calculus of kidney: Secondary | ICD-10-CM | POA: Insufficient documentation

## 2012-07-23 ENCOUNTER — Ambulatory Visit
Admission: RE | Admit: 2012-07-23 | Discharge: 2012-07-23 | Disposition: A | Discharge status: Home / Self Care | Payer: Medicaid Other | Source: Ambulatory Visit | Attending: Family Medicine | Admitting: Family Medicine

## 2012-07-23 ENCOUNTER — Encounter: Payer: Self-pay | Admitting: Family Medicine

## 2012-07-23 ENCOUNTER — Ambulatory Visit (INDEPENDENT_AMBULATORY_CARE_PROVIDER_SITE_OTHER): Payer: Medicaid Other | Admitting: Family Medicine

## 2012-07-23 VITALS — BP 137/87 | HR 86 | Temp 97.6°F | Ht 71.0 in | Wt >= 6400 oz

## 2012-07-23 DIAGNOSIS — R079 Chest pain, unspecified: Secondary | ICD-10-CM

## 2012-07-23 LAB — CBC WITH DIFFERENTIAL/PLATELET
Eosinophils Relative: 2 % (ref 0–5)
HCT: 39 % (ref 36.0–46.0)
Lymphocytes Relative: 51 % — ABNORMAL HIGH (ref 12–46)
Lymphs Abs: 4.4 10*3/uL — ABNORMAL HIGH (ref 0.7–4.0)
MCV: 86.3 fL (ref 78.0–100.0)
Monocytes Absolute: 0.5 10*3/uL (ref 0.1–1.0)
Monocytes Relative: 6 % (ref 3–12)
RBC: 4.52 MIL/uL (ref 3.87–5.11)
RDW: 13.6 % (ref 11.5–15.5)
WBC: 8.6 10*3/uL (ref 4.0–10.5)

## 2012-07-23 NOTE — Progress Notes (Signed)
Patient ID: Karen Brennan, female   DOB: 08-29-1975, 37 y.o.   MRN: [id number] Subjective: The patient is a 37 y.o. year old female who presents today for URI and chest pain while breathing.  Patient reports 2 days of URI symptoms (mild cough, sneezing, rhinorrhea). Last night she began to notice occasional substernal chest pain that was aching to sharp in character. It is only present with deep breathing. It is not present all the time. The patient denies any true shortness of breath, lower extremity swelling, visual changes, syncope, pain or numbness in her jaw and left arm, fevers, chills, productive cough. The patient is not have any cardiac history.  Patient's past medical, social, and family history were reviewed and updated as appropriate. History  Substance Use Topics  . Smoking status: Former Smoker -- 0.25 packs/day for 4 years    Types: Cigarettes    Quit date: 06/12/2004  . Smokeless tobacco: Never Used  . Alcohol Use: No   Objective:  Filed Vitals:   07/23/12 0908  BP: 137/87  Pulse: 86  Temp: 97.6 F (36.4 C)   Gen: No acute distress, morbidly obese CV: Regular rate and rhythm, no murmurs appreciated Resp: Clear to auscultation bilaterally, somewhat diminished secondary to body habitus. No focal findings Ext: No asymmetric edema  Assessment/Plan: Chest pain, atypical. If this were to be categorized with likely be pleuritic type chest pain. Patient is not tachycardic his O2 requirement making pulmonary embolism extremely unlikely. Given the fact she has URI symptoms in addition less likely diagnoses are costochondritis versus pneumonia. Given the lack of cardiac history, female gender, and age, no cardiac etiology is extremely unlikely. I will obtain a CBC and chest x-ray and, if these are unremarkable, symptomatic treatment  Please also see individual problems in problem list for problem-specific plans.

## 2012-07-23 NOTE — Patient Instructions (Signed)
It was great to see you today! Go over to [facility] and get a chest x-ray.  We will call you if it shows a pneumonia. We will also get some blood work to make certain you don't have a very high white blood cell count that would indicate a bad infection. Congratulations on losing so much weight!  Keep up the good work!

## 2012-10-03 ENCOUNTER — Ambulatory Visit (INDEPENDENT_AMBULATORY_CARE_PROVIDER_SITE_OTHER): Payer: Medicaid Other | Admitting: Family Medicine

## 2012-10-03 ENCOUNTER — Ambulatory Visit: Payer: Medicaid Other | Admitting: Family Medicine

## 2012-10-03 ENCOUNTER — Encounter: Payer: Self-pay | Admitting: Family Medicine

## 2012-10-03 VITALS — BP 144/83 | HR 88 | Temp 97.4°F | Ht 71.0 in | Wt >= 6400 oz

## 2012-10-03 DIAGNOSIS — I10 Essential (primary) hypertension: Secondary | ICD-10-CM

## 2012-10-03 DIAGNOSIS — J309 Allergic rhinitis, unspecified: Secondary | ICD-10-CM

## 2012-10-03 MED ORDER — CETIRIZINE HCL 10 MG PO TABS: 10.0000 mg | ORAL_TABLET | Freq: Every day | ORAL | Status: DC

## 2012-10-03 MED ORDER — HYDROCHLOROTHIAZIDE 25 MG PO TABS: 25.0000 mg | ORAL_TABLET | Freq: Every day | ORAL | Status: DC

## 2012-10-03 MED ORDER — FLUTICASONE PROPIONATE 50 MCG/ACT NA SUSP: 2.0000 | Freq: Every day | NASAL | Status: DC

## 2012-10-03 NOTE — Patient Instructions (Signed)
It was nice to meet you today.  I am restarting your blood pressure medicine since your blood pressure is a little high today.  Take 1 pill every day.  If you feel like your blood pressure is getting too low (consistently getting dizzy when you stand), take 1/2 pill instead.  I think your headaches may be from your allergies-- I sent in your Zyrtec and Flonse.  Use both once per day.  Your dizziness sounds like BPPV, a benign condition that will sometimes make you feel dizzy.  It may get better when we get your allergies under control.  Come back to see your regular doctor in 2 weeks so we can recheck your blood pressure.

## 2012-10-03 NOTE — Progress Notes (Signed)
S: Pt comes in today for SDA for elevated BP.  She reports that the diastolic number was 92 yesterday at [facility].  Is not currently taking any antiHTN medications.  Previously Rx'ed HCTZ 25 and lisinopril 5mg -- but has only ever taken the HCTZ.  Meds were d/c'ed 11/2011 s/p gastric bypass surgery.    Has not been feeling well for the past 1 week.  +headache, +dizziness.  Headache comes and goes, is mostly frontal.  Can't think of an specific triggers.  Tylenol helps relieve the pain.  Has history of migraines.  This feels different than her migraines.  Dizziness is not related to the headache-- can happen in any position; does get dizzy with moving head quickly.  Feels like the room is spinning.  Dizziness lasts for a few minutes and then resolves spontaneously.  Denies any numbness, tingling, weakness; no problems with walking.  No chest pain, SOB.  +congestion and runny nose  Also has allergies- has been taking Benadryl.  Previously taking Zyrtec and a nose spray, which helped a lot; is out of it now.      ROS: Per HPI  History  Smoking status  . Former Smoker -- 0.25 packs/day for 4 years  . Types: Cigarettes  . Quit date: 06/12/2004  Smokeless tobacco  . Never Used    O:  Filed Vitals:   10/03/12 0857  BP: 144/83  Pulse: 88  Temp: 97.4 F (36.3 C)    Gen: NAD HEENT: mild pharyngeal erythema, nasal mucosa boggy CV: RRR, no murmur Pulm: CTA bilat, no wheezes or crackles Ext: Warm, no edema   A/P: 37 y.o. female p/w allergies, HTN -See problem list -f/u in 2 weeks with PCP

## 2012-10-03 NOTE — Assessment & Plan Note (Signed)
Likely cause of her frontal headaches.  Restart Zyrtec and flonase.  May be contributing to her vertigo (sounds c/w BPPV)

## 2012-10-03 NOTE — Assessment & Plan Note (Signed)
Restart HCTZ. Check BMET at f/u to ensure Cr is still ok.  Warning signs of hypotension discussed.

## 2012-11-07 ENCOUNTER — Encounter: Payer: Self-pay | Admitting: Family Medicine

## 2012-11-07 ENCOUNTER — Telehealth: Payer: Self-pay | Admitting: *Deleted

## 2012-11-07 ENCOUNTER — Ambulatory Visit (INDEPENDENT_AMBULATORY_CARE_PROVIDER_SITE_OTHER): Payer: Medicaid Other | Admitting: Family Medicine

## 2012-11-07 VITALS — BP 138/81 | HR 91 | Temp 98.4°F | Ht 71.0 in | Wt >= 6400 oz

## 2012-11-07 DIAGNOSIS — L219 Seborrheic dermatitis, unspecified: Secondary | ICD-10-CM

## 2012-11-07 DIAGNOSIS — I10 Essential (primary) hypertension: Secondary | ICD-10-CM

## 2012-11-07 DIAGNOSIS — J309 Allergic rhinitis, unspecified: Secondary | ICD-10-CM

## 2012-11-07 MED ORDER — HYDROCHLOROTHIAZIDE 25 MG PO TABS: 25.0000 mg | ORAL_TABLET | Freq: Every day | ORAL | Status: DC

## 2012-11-07 MED ORDER — CETIRIZINE HCL 10 MG PO TABS: 10.0000 mg | ORAL_TABLET | Freq: Every day | ORAL | Status: DC

## 2012-11-07 MED ORDER — DESONIDE 0.05 % EX LOTN: TOPICAL_LOTION | Freq: Two times a day (BID) | CUTANEOUS | Status: DC

## 2012-11-07 MED ORDER — FLUOCINOLONE ACETONIDE 0.01 % EX SHAM: MEDICATED_SHAMPOO | CUTANEOUS | Status: DC

## 2012-11-07 MED ORDER — OMEPRAZOLE 20 MG PO CPDR: 20.0000 mg | DELAYED_RELEASE_CAPSULE | Freq: Every day | ORAL | Status: DC

## 2012-11-07 MED ORDER — CITALOPRAM HYDROBROMIDE 40 MG PO TABS: 40.0000 mg | ORAL_TABLET | Freq: Every day | ORAL | Status: DC

## 2012-11-07 MED ORDER — HYDROXYZINE HCL 25 MG PO TABS: 25.0000 mg | ORAL_TABLET | Freq: Four times a day (QID) | ORAL | Status: DC | PRN

## 2012-11-07 MED ORDER — FLUTICASONE PROPIONATE 50 MCG/ACT NA SUSP: 2.0000 | Freq: Every day | NASAL | Status: DC

## 2012-11-07 MED ORDER — POLYETHYLENE GLYCOL 3350 17 G PO PACK: 17.0000 g | PACK | Freq: Every day | ORAL | Status: DC | PRN

## 2012-11-07 NOTE — Patient Instructions (Addendum)
It was nice seeing you today.  I have prescribed two agents for your Seborrheic dermatitis.  Please use them as prescribed.  Do not use for more than 14 days consecutively.  After skin/scalp clears in the next 2 weeks, then use sparingly as needed (the steroid has the potential to cause hypopigmentation if used to much/often)  Please follow up in 2 weeks to 1 month to assess for improvement.

## 2012-11-07 NOTE — Assessment & Plan Note (Addendum)
Will treat scalp with topical high potency shampoo - Fluocinolone 0.01%. Will treat facial areas with topical Desonide lotion 0.05%.   Patient warned about the possibility of hypopigmentation from long term use.  Cautioned patient not to use consecutively for more than 14 days and then to use sparingly.  Encouraged follow in the the next 2-4 weeks.

## 2012-11-07 NOTE — Telephone Encounter (Signed)
Received [facility] for Desonide 0.05% LOTION.  Form placed in doctor's box for completion, attached are the preferred drugs. Will have md return form after completion to me. Wyatt Haste, RN-BSN

## 2012-11-07 NOTE — Assessment & Plan Note (Signed)
Well controlled currently on HCTZ.  Will continue current medication regimen.

## 2012-11-07 NOTE — Progress Notes (Signed)
Subjective:     Patient ID: Karen Brennan, female   DOB: August 14, 1975, 37 y.o.   MRN: [id number]  HPI 37 year old female presents for comprehensive physical exam.    1) Seborrheic dermatitis - Patient reports Seborrheic dermatitis (affecting the scalp and face) that has been persistent for approximately 1 year.  It has been affecting her face for approximately 2 months - Continues to persist.  She has been treated with a medicated shampoo in the past with little improvement.  She has also tried OTC facial creams/lotions/cleaners with little improvement. - No exacerbating or relieving factors.  - She reports severe itching. She also notes some hyperpigmentation of her face as well.  Review of Systems Patient denies SOB, chest pain, LE edema, joint pain/swelling, fevers/chills, abdominal pain.    Objective:   Physical Exam Filed Vitals:   11/07/12 1327  BP: 138/81  Pulse: 91  Temp: 98.4 F (36.9 C)   Exam: General: well appearing, NAD. Cardiovascular: RRR. No murmurs, rubs, or gallops. Respiratory: CTAB. No rales, rhonchi, or wheeze. Abdomen: obese, soft, nontender, nondistended. Extremities: warm, well perfused. No LE edema. Skin: severe scalp dryness noted.  Dryness with associated mild erythema also noted on face (around nasolabial folds and cheeks).     Assessment:         Plan:

## 2012-11-08 ENCOUNTER — Telehealth: Payer: Self-pay | Admitting: *Deleted

## 2012-11-08 NOTE — Telephone Encounter (Signed)
Received [facility] for [facility] shampoo.  Form placed in doctor's box for completion, attached are the preferred drugs. Will have md return form after completion to me. Wyatt Haste, RN-BSN

## 2012-11-11 ENCOUNTER — Other Ambulatory Visit: Payer: Self-pay | Admitting: Family Medicine

## 2012-11-11 MED ORDER — DESONIDE 0.05 % EX CREA: TOPICAL_CREAM | CUTANEOUS | Status: DC

## 2012-11-11 MED ORDER — FLUOCINONIDE 0.05 % EX GEL: CUTANEOUS | Status: DC

## 2013-01-06 ENCOUNTER — Ambulatory Visit (INDEPENDENT_AMBULATORY_CARE_PROVIDER_SITE_OTHER): Payer: Medicaid Other | Admitting: Family Medicine

## 2013-01-06 ENCOUNTER — Encounter: Payer: Self-pay | Admitting: Family Medicine

## 2013-01-06 VITALS — BP 113/69 | HR 95 | Temp 98.4°F | Ht 71.0 in | Wt >= 6400 oz

## 2013-01-06 DIAGNOSIS — R5381 Other malaise: Secondary | ICD-10-CM

## 2013-01-06 DIAGNOSIS — R5383 Other fatigue: Secondary | ICD-10-CM | POA: Insufficient documentation

## 2013-01-06 LAB — CBC
Platelets: 382 10*3/uL (ref 150–400)
RDW: 13.5 % (ref 11.5–15.5)
WBC: 8.3 10*3/uL (ref 4.0–10.5)

## 2013-01-06 NOTE — Assessment & Plan Note (Addendum)
Started 4 days ago. No precipitant event or related to other symptoms. Pt concerned about anemia but no recent hx of heavy bleeding. Last TSH WNL Pt with hx of depression and Bipolar disorder f/u at [facility] on atypical antipsychotic medication. Last appointment with them1 month ago with no change on her therapy. Plan CBC, BMET and TSH to r/o acute etiology of her symptoms. If labs WNL consider advise to f/u wit [facility].

## 2013-01-06 NOTE — Progress Notes (Signed)
Family Medicine Office Visit Note   Subjective:   Patient ID: Karen Brennan, female  DOB: [birth year], 37 y.o.. MRN: [id number]   Pt that comes today for same day appointment complaining of generalized fatigue/tiredness for the past 4 days. No other symptoms. No problems with sleep, good appetite, but feels too tired to do anything. She is concerned about having vitamin deficiencies or anemia. Pt report taking daily Multivitamins and Iron.  Denies suicidal ideation or plans.   PMHx significant for bipolar disorder f/u at [facility]. Reports compliance with Olanzapine (taking TID, does not recall mg) and Celexa.  TSH in the past WNL.   Review of Systems:  Per HPI Denies SOB, palpitations, nausea, vomiting, fever, chills, abdominal/chest pain, dizziness, focalized  Weakness, clod or heat intolerant or tremors.  Normal urine and stools.   Objective:   Physical Exam: Gen:  Morbid obese, NAD HEENT: Moist mucous membranes  CV: Regular rate and rhythm, no murmurs rubs or gallops PULM: Clear to auscultation bilaterally. No wheezes/rales/rhonchi ABD: Soft, non tender, non distended, normal bowel sounds EXT: No edema Neuro: Alert and oriented x3. No focalization Psych: Depressed mood and affect. Normal speech. Normal though process. No hallucinations/ delusions. No agitation.  Assessment & Plan:

## 2013-01-06 NOTE — Patient Instructions (Addendum)
Continue taking your medications as prescribed. I will call you with the labs results if they come back abnormal but you will need to schedule an appointment to discuss them  with your doctor.

## 2013-01-07 LAB — BASIC METABOLIC PANEL
Chloride: 101 mEq/L (ref 96–112)
Potassium: 3.8 mEq/L (ref 3.5–5.3)
Sodium: 139 mEq/L (ref 135–145)

## 2013-01-07 LAB — TSH: TSH: 0.952 u[IU]/mL (ref 0.350–4.500)

## 2013-01-08 ENCOUNTER — Encounter: Payer: Self-pay | Admitting: Family Medicine

## 2013-01-16 ENCOUNTER — Other Ambulatory Visit: Payer: Self-pay | Admitting: Family Medicine

## 2013-05-14 ENCOUNTER — Ambulatory Visit (INDEPENDENT_AMBULATORY_CARE_PROVIDER_SITE_OTHER): Payer: Medicaid Other | Admitting: *Deleted

## 2013-05-14 DIAGNOSIS — Z23 Encounter for immunization: Secondary | ICD-10-CM

## 2013-05-15 ENCOUNTER — Encounter: Payer: Self-pay | Admitting: Family Medicine

## 2013-05-15 ENCOUNTER — Ambulatory Visit (INDEPENDENT_AMBULATORY_CARE_PROVIDER_SITE_OTHER): Payer: Medicaid Other | Admitting: Family Medicine

## 2013-05-15 VITALS — BP 118/82 | HR 105 | Temp 98.3°F | Resp 18 | Wt >= 6400 oz

## 2013-05-15 DIAGNOSIS — L293 Anogenital pruritus, unspecified: Secondary | ICD-10-CM

## 2013-05-15 DIAGNOSIS — N898 Other specified noninflammatory disorders of vagina: Secondary | ICD-10-CM | POA: Insufficient documentation

## 2013-05-15 DIAGNOSIS — R21 Rash and other nonspecific skin eruption: Secondary | ICD-10-CM | POA: Insufficient documentation

## 2013-05-15 DIAGNOSIS — N760 Acute vaginitis: Secondary | ICD-10-CM

## 2013-05-15 LAB — POCT WET PREP (WET MOUNT): Clue Cells Wet Prep Whiff POC: NEGATIVE

## 2013-05-15 MED ORDER — CLOTRIMAZOLE 1 % VA CREA: 1.0000 | TOPICAL_CREAM | Freq: Every day | VAGINAL | Status: DC

## 2013-05-15 MED ORDER — CLOTRIMAZOLE 1 % EX CREA: 1.0000 "application " | TOPICAL_CREAM | Freq: Two times a day (BID) | CUTANEOUS | Status: DC

## 2013-05-15 NOTE — Progress Notes (Signed)
Subjective:     Patient ID: Karen Brennan, female   DOB: [birth year], 37 y.o.   MRN: [id number]  HPI 1) Vaginal itching  Onset: 1 week  Description: No discharge present but severe vaginal itching.  Odor: Yes  Symptoms Dysuria: no  Bleeding: no  Pelvic pain: no  Back pain: no  Fever: no  Prior treatment: no   Red Flags: Missed period: no  Pregnancy: no  Sexual activity: no  Possible STD exposure: no   2) Rash - Patient also reports rash in the lower abdomen and pelvic area.  Rash has been present for ~ 1 week. - No interventions tried. - No exacerbating/relieving factors.   Review of Systems Per HPI    Objective:   Physical Exam Filed Vitals:   05/15/13 0939  BP: 118/82  Pulse: 105  Temp: 98.3 F (36.8 C)  Resp: 18   General: well appearing obese female in NAD. Pelvic Exam:        External: normal female genitalia without lesions or masses        Vagina: small amount of clear discharge noted.         Cervix: normal without lesions or masses        Samples for Wet prep Skin: hyperpigmented areas noted in the pelvic region, in the pannus, and in the skin folds.    Assessment/Plan:     See Problem List

## 2013-05-15 NOTE — Patient Instructions (Signed)
It was nice to see you today.   I have called in 2 prescriptions for you.  Please use them as prescribed.  Follow up yearly or as needed.

## 2013-05-15 NOTE — Assessment & Plan Note (Signed)
Consistent with fungal/yeast infection. Rx sent for clotrimazole.

## 2013-05-15 NOTE — Assessment & Plan Note (Signed)
Wet prep obtained. Given severity of symptoms will treat empirically for yeast infection. Rx for Clotrimazole sent.

## 2013-06-27 ENCOUNTER — Ambulatory Visit (INDEPENDENT_AMBULATORY_CARE_PROVIDER_SITE_OTHER): Payer: Medicaid Other | Admitting: Family Medicine

## 2013-06-27 ENCOUNTER — Ambulatory Visit (HOSPITAL_COMMUNITY)
Admission: RE | Admit: 2013-06-27 | Discharge: 2013-06-27 | Disposition: A | Discharge status: Home / Self Care | Payer: Medicaid Other | Source: Ambulatory Visit | Attending: Family Medicine | Admitting: Family Medicine

## 2013-06-27 ENCOUNTER — Encounter: Payer: Self-pay | Admitting: Family Medicine

## 2013-06-27 VITALS — BP 132/87 | HR 98 | Temp 99.5°F | Ht 71.0 in | Wt >= 6400 oz

## 2013-06-27 DIAGNOSIS — R079 Chest pain, unspecified: Secondary | ICD-10-CM

## 2013-06-27 DIAGNOSIS — B349 Viral infection, unspecified: Secondary | ICD-10-CM

## 2013-06-27 DIAGNOSIS — B9789 Other viral agents as the cause of diseases classified elsewhere: Secondary | ICD-10-CM

## 2013-06-27 MED ORDER — ONDANSETRON 4 MG PO TBDP: 4.0000 mg | ORAL_TABLET | Freq: Three times a day (TID) | ORAL | Status: DC | PRN

## 2013-06-27 MED ORDER — LORATADINE 10 MG PO TABS: 10.0000 mg | ORAL_TABLET | Freq: Every day | ORAL | Status: DC

## 2013-06-27 NOTE — Progress Notes (Signed)
Family Medicine Office Visit Note   Subjective:   Patient ID: Karen Brennan, female  DOB: 02/19/1976, 38 y.o.. MRN: [id number]   Pt that comes today for same day appointment complaining of nausea and upper respiratory symptoms for 3 days. The constellation of symptoms she reports is as follows:  watery runny nose and sinus congestion as well as intermittent mils frontal headaches that self resolves. Nausea without vomiting and chest discomfort. Mild cough without sputum production.  Denies fever, chills, malaise, urinary symptoms of dysuria frequency or flank pain, diarrhea or constipation. No reported blood in stools. Denies hx of sick contact.   Review of Systems:  Per HPI  Objective:   Physical Exam: Gen:  Morbidly obese. NAD HEENT: Moist mucous membranes. Normal transillumination of paranasal and maxillary sinuses bilaterally. Oropharynx erythema without exudates.  Neck supple, no meningismus, no adenopathies.  CV: Regular rate and rhythm, no murmurs rubs or gallops PULM: Clear to auscultation bilaterally. No wheezes/rales/rhonchi ABD: Obese, Soft, non tender, non distended, normal bowel sounds EXT: No edema Neuro: Alert and oriented x3. No focalization  Assessment & Plan:

## 2013-06-27 NOTE — Patient Instructions (Signed)
It seems that you have a viral process. Only symptomatic treatment is indicated at this time. Zofran take it as indicated for nausea Loratadine 1 tab daily. If you are taking Hydroxyzine you don't need to take this one.  You can also use the steam of a hot shower or humidifier to help with your upper respiratory symptoms (watery nose, congestion) Follow up with us if your symptoms do not improve or other concerning symptoms appear.

## 2013-06-27 NOTE — Assessment & Plan Note (Signed)
Her symptoms and acuity of them are most likely from viral illness. Physical exam is reassuring, only positive for oropharyngeal erythema. EKG is wnl.  Recommended symptomatic treatment with close f/u Pt agreeable with plan.

## 2013-07-17 ENCOUNTER — Ambulatory Visit: Payer: Self-pay | Admitting: Family Medicine

## 2013-07-22 ENCOUNTER — Ambulatory Visit: Payer: Self-pay | Admitting: Family Medicine

## 2013-08-29 ENCOUNTER — Other Ambulatory Visit: Payer: Self-pay | Admitting: Family Medicine

## 2013-12-05 ENCOUNTER — Encounter: Payer: Self-pay | Admitting: Family Medicine

## 2013-12-05 ENCOUNTER — Encounter: Payer: Self-pay | Admitting: *Deleted

## 2013-12-05 ENCOUNTER — Ambulatory Visit (INDEPENDENT_AMBULATORY_CARE_PROVIDER_SITE_OTHER): Payer: Medicaid Other | Admitting: Family Medicine

## 2013-12-05 ENCOUNTER — Other Ambulatory Visit (HOSPITAL_COMMUNITY)
Admission: RE | Admit: 2013-12-05 | Discharge: 2013-12-05 | Disposition: A | Discharge status: Home / Self Care | Payer: Medicaid Other | Source: Ambulatory Visit | Attending: Family Medicine | Admitting: Family Medicine

## 2013-12-05 VITALS — BP 111/64 | HR 103 | Temp 98.1°F | Ht 70.0 in | Wt >= 6400 oz

## 2013-12-05 DIAGNOSIS — F3160 Bipolar disorder, current episode mixed, unspecified: Secondary | ICD-10-CM | POA: Diagnosis not present

## 2013-12-05 DIAGNOSIS — R5383 Other fatigue: Secondary | ICD-10-CM

## 2013-12-05 DIAGNOSIS — L219 Seborrheic dermatitis, unspecified: Secondary | ICD-10-CM | POA: Insufficient documentation

## 2013-12-05 DIAGNOSIS — Z124 Encounter for screening for malignant neoplasm of cervix: Secondary | ICD-10-CM | POA: Diagnosis not present

## 2013-12-05 DIAGNOSIS — R87810 Cervical high risk human papillomavirus (HPV) DNA test positive: Secondary | ICD-10-CM | POA: Insufficient documentation

## 2013-12-05 DIAGNOSIS — R5381 Other malaise: Secondary | ICD-10-CM | POA: Diagnosis not present

## 2013-12-05 DIAGNOSIS — Z01419 Encounter for gynecological examination (general) (routine) without abnormal findings: Secondary | ICD-10-CM | POA: Insufficient documentation

## 2013-12-05 DIAGNOSIS — I10 Essential (primary) hypertension: Secondary | ICD-10-CM

## 2013-12-05 DIAGNOSIS — Z114 Encounter for screening for human immunodeficiency virus [HIV]: Secondary | ICD-10-CM

## 2013-12-05 DIAGNOSIS — F3189 Other bipolar disorder: Secondary | ICD-10-CM

## 2013-12-05 DIAGNOSIS — Z0000 Encounter for general adult medical examination without abnormal findings: Secondary | ICD-10-CM | POA: Diagnosis not present

## 2013-12-05 DIAGNOSIS — Z1151 Encounter for screening for human papillomavirus (HPV): Secondary | ICD-10-CM | POA: Insufficient documentation

## 2013-12-05 DIAGNOSIS — Z1159 Encounter for screening for other viral diseases: Secondary | ICD-10-CM

## 2013-12-05 LAB — CBC
HEMATOCRIT: 37.4 % (ref 36.0–46.0)
Hemoglobin: 12.8 g/dL (ref 12.0–15.0)
MCH: 28.4 pg (ref 26.0–34.0)
MCHC: 34.2 g/dL (ref 30.0–36.0)
MCV: 83.1 fL (ref 78.0–100.0)
PLATELETS: 355 10*3/uL (ref 150–400)
RBC: 4.5 MIL/uL (ref 3.87–5.11)
RDW: 13.7 % (ref 11.5–15.5)
WBC: 7.6 10*3/uL (ref 4.0–10.5)

## 2013-12-05 LAB — POCT GLYCOSYLATED HEMOGLOBIN (HGB A1C): Hemoglobin A1C: 5.7

## 2013-12-05 MED ORDER — HYDROCHLOROTHIAZIDE 25 MG PO TABS: ORAL_TABLET | ORAL | Status: DC

## 2013-12-05 MED ORDER — CICLOPIROX 1 % EX SHAM: MEDICATED_SHAMPOO | CUTANEOUS | Status: DC

## 2013-12-05 MED ORDER — HYDROXYZINE HCL 25 MG PO TABS: 25.0000 mg | ORAL_TABLET | Freq: Four times a day (QID) | ORAL | Status: DC | PRN

## 2013-12-05 MED ORDER — FLUOCINOLONE ACETONIDE 0.01 % EX SHAM: MEDICATED_SHAMPOO | CUTANEOUS | Status: DC

## 2013-12-05 MED ORDER — CITALOPRAM HYDROBROMIDE 40 MG PO TABS: 40.0000 mg | ORAL_TABLET | Freq: Every day | ORAL | Status: DC

## 2013-12-05 MED ORDER — DESONIDE 0.05 % EX OINT: 1.0000 "application " | TOPICAL_OINTMENT | Freq: Two times a day (BID) | CUTANEOUS | Status: DC

## 2013-12-05 MED ORDER — OMEPRAZOLE 20 MG PO CPDR: 20.0000 mg | DELAYED_RELEASE_CAPSULE | Freq: Every day | ORAL | Status: DC

## 2013-12-05 NOTE — Progress Notes (Signed)
   Subjective:    Patient ID: Karen Brennan, female    DOB: 06/22/1975, 38 y.o.   MRN: [id number]  HPI 38 year old female with bipolar disorder and HTN presents for her annual exam. Patient has several concerns/issues today as below.   1) HTN Home BP Monitoring - No Medications: HCTZ Compliance - Yes ROS: Denies chest pain, SOB, lightheadedness/dizziness   2) Bipolar disorder - [facility]ed by [facility] - Patient is on Zyprexa per report  3) Seborrheic dermatitis - Patient reports long-standing history of seborrheic dermatitis - She has tried ketoconazole and Lidex gel without resolution. - She has also tried Desonide cream for facial areas. - She continues to be bothered by this (particularly by the itching).  4) Preventative care - In need of pap smear/annual exam today.  Social Hx - Former Smoker.  Review of Systems Per HPI    Objective:   Physical Exam Exam: General: well appearing obese female in NAD.  Cardiovascular: RRR. No murmurs, rubs, or gallops. Respiratory: CTAB. No rales, rhonchi, or wheeze. Abdomen: obese, soft, nontender, nondistended. Extremities: No LE edema.  Skin: Dry scalp with significant scaling noted. Mild dry skin and hyperpigmentation noted on face around/near temporal region. Pelvic Exam:        External: normal female genitalia without lesions or masses        Vagina: normal without lesions or masses        Cervix: normal without lesions or masses        Pap smear: performed   Assessment & Plan:  See Problem List

## 2013-12-05 NOTE — Assessment & Plan Note (Addendum)
Will treat with Desonide ointment (for face) and Lidex shampoo and Ciclopirox.

## 2013-12-05 NOTE — Progress Notes (Signed)
Prior Authorization received from [facility] for Ciclopirox shampoo. Formulary and PA form placed in provider box for completion. Clovis PuMartin, Tamika L, RN

## 2013-12-05 NOTE — Patient Instructions (Addendum)
I have prescribed 3 medications regarding your seborrheic dermatitis. Use as prescribed.  We are obtaining lab work regarding your fatigue.  You will receive a letter regarding your pap smear.  Follow up annually or sooner if needed.

## 2013-12-05 NOTE — Assessment & Plan Note (Signed)
Stable and followed by [facility].

## 2013-12-05 NOTE — Assessment & Plan Note (Signed)
BP well controlled and at goal. Will continue HCTZ. Refilled HCTZ today.

## 2013-12-06 LAB — COMPLETE METABOLIC PANEL WITH GFR
ALK PHOS: 62 U/L (ref 39–117)
ALT: 18 U/L (ref 0–35)
AST: 19 U/L (ref 0–37)
Albumin: 4 g/dL (ref 3.5–5.2)
BILIRUBIN TOTAL: 0.6 mg/dL (ref 0.2–1.2)
BUN: 11 mg/dL (ref 6–23)
CO2: 30 mEq/L (ref 19–32)
CREATININE: 0.82 mg/dL (ref 0.50–1.10)
Calcium: 9.1 mg/dL (ref 8.4–10.5)
Chloride: 100 mEq/L (ref 96–112)
GFR, Est African American: >89 mL/min
GFR, Est Non African American: >89 mL/min
Glucose, Bld: 95 mg/dL (ref 70–99)
Potassium: 4 mEq/L (ref 3.5–5.3)
Sodium: 140 mEq/L (ref 135–145)
Total Protein: 7.1 g/dL (ref 6.0–8.3)

## 2013-12-06 LAB — HIV ANTIBODY (ROUTINE TESTING W REFLEX): HIV: NONREACTIVE

## 2013-12-08 ENCOUNTER — Encounter: Payer: Self-pay | Admitting: Family Medicine

## 2013-12-08 NOTE — Progress Notes (Signed)
Received PA approval for Ciclopirox 1% shampoo via [employer].  Med approved for 12/08/2013 - 03/08/2014.  [facility] informed.  PA confirmation number [id number] Link SnufferW. Martin, Bronson Ingamika L, RN

## 2013-12-09 LAB — CYTOLOGY - PAP

## 2013-12-10 ENCOUNTER — Encounter: Payer: Self-pay | Admitting: Family Medicine

## 2014-02-28 ENCOUNTER — Emergency Department (HOSPITAL_COMMUNITY)
Admission: EM | Admit: 2014-02-28 | Discharge: 2014-02-28 | Disposition: A | Discharge status: Other Acute Inpatient Hospital | Payer: Medicaid Other | Source: Home / Self Care | Attending: Family Medicine | Admitting: Family Medicine

## 2014-02-28 ENCOUNTER — Emergency Department (HOSPITAL_COMMUNITY)
Admission: EM | Admit: 2014-02-28 | Discharge: 2014-02-28 | Disposition: A | Discharge status: Home / Self Care | Payer: Medicaid Other | Attending: Emergency Medicine | Admitting: Emergency Medicine

## 2014-02-28 ENCOUNTER — Emergency Department (HOSPITAL_COMMUNITY): Payer: Medicaid Other

## 2014-02-28 ENCOUNTER — Encounter (HOSPITAL_COMMUNITY): Payer: Self-pay | Admitting: Emergency Medicine

## 2014-02-28 DIAGNOSIS — Z87442 Personal history of urinary calculi: Secondary | ICD-10-CM | POA: Diagnosis not present

## 2014-02-28 DIAGNOSIS — Z8709 Personal history of other diseases of the respiratory system: Secondary | ICD-10-CM | POA: Diagnosis not present

## 2014-02-28 DIAGNOSIS — Z87891 Personal history of nicotine dependence: Secondary | ICD-10-CM | POA: Diagnosis not present

## 2014-02-28 DIAGNOSIS — I10 Essential (primary) hypertension: Secondary | ICD-10-CM | POA: Diagnosis not present

## 2014-02-28 DIAGNOSIS — F319 Bipolar disorder, unspecified: Secondary | ICD-10-CM | POA: Insufficient documentation

## 2014-02-28 DIAGNOSIS — R319 Hematuria, unspecified: Secondary | ICD-10-CM | POA: Insufficient documentation

## 2014-02-28 DIAGNOSIS — R109 Unspecified abdominal pain: Secondary | ICD-10-CM | POA: Diagnosis present

## 2014-02-28 DIAGNOSIS — K219 Gastro-esophageal reflux disease without esophagitis: Secondary | ICD-10-CM | POA: Diagnosis not present

## 2014-02-28 DIAGNOSIS — R110 Nausea: Secondary | ICD-10-CM | POA: Insufficient documentation

## 2014-02-28 DIAGNOSIS — F411 Generalized anxiety disorder: Secondary | ICD-10-CM | POA: Diagnosis not present

## 2014-02-28 DIAGNOSIS — Z79899 Other long term (current) drug therapy: Secondary | ICD-10-CM | POA: Diagnosis not present

## 2014-02-28 DIAGNOSIS — N23 Unspecified renal colic: Secondary | ICD-10-CM

## 2014-02-28 DIAGNOSIS — R52 Pain, unspecified: Secondary | ICD-10-CM

## 2014-02-28 DIAGNOSIS — IMO0002 Reserved for concepts with insufficient information to code with codable children: Secondary | ICD-10-CM | POA: Insufficient documentation

## 2014-02-28 DIAGNOSIS — D649 Anemia, unspecified: Secondary | ICD-10-CM | POA: Diagnosis not present

## 2014-02-28 DIAGNOSIS — Z3202 Encounter for pregnancy test, result negative: Secondary | ICD-10-CM | POA: Diagnosis not present

## 2014-02-28 LAB — URINALYSIS, ROUTINE W REFLEX MICROSCOPIC
GLUCOSE, UA: NEGATIVE mg/dL
HGB URINE DIPSTICK: NEGATIVE
KETONES UR: NEGATIVE mg/dL
Leukocytes, UA: NEGATIVE
Nitrite: NEGATIVE
PH: 5.5 (ref 5.0–8.0)
PROTEIN: NEGATIVE mg/dL
Specific Gravity, Urine: 1.027 (ref 1.005–1.030)
Urobilinogen, UA: 0.2 mg/dL (ref 0.0–1.0)

## 2014-02-28 LAB — POCT URINALYSIS DIP (DEVICE)
Bilirubin Urine: NEGATIVE
GLUCOSE, UA: NEGATIVE mg/dL
Ketones, ur: NEGATIVE mg/dL
Nitrite: NEGATIVE
Protein, ur: NEGATIVE mg/dL
Specific Gravity, Urine: 1.02 (ref 1.005–1.030)
UROBILINOGEN UA: 1 mg/dL (ref 0.0–1.0)
pH: 5.5 (ref 5.0–8.0)

## 2014-02-28 LAB — CBC WITH DIFFERENTIAL/PLATELET
Basophils Absolute: 0 10*3/uL (ref 0.0–0.1)
Basophils Relative: 0 % (ref 0–1)
EOS PCT: 1 % (ref 0–5)
Eosinophils Absolute: 0.1 10*3/uL (ref 0.0–0.7)
HCT: 41.5 % (ref 36.0–46.0)
HEMOGLOBIN: 13.9 g/dL (ref 12.0–15.0)
LYMPHS ABS: 3.9 10*3/uL (ref 0.7–4.0)
Lymphocytes Relative: 51 % — ABNORMAL HIGH (ref 12–46)
MCH: 28.5 pg (ref 26.0–34.0)
MCHC: 33.5 g/dL (ref 30.0–36.0)
MCV: 85.2 fL (ref 78.0–100.0)
MONOS PCT: 6 % (ref 3–12)
Monocytes Absolute: 0.5 10*3/uL (ref 0.1–1.0)
Neutro Abs: 3.2 10*3/uL (ref 1.7–7.7)
Neutrophils Relative %: 42 % — ABNORMAL LOW (ref 43–77)
Platelets: 363 10*3/uL (ref 150–400)
RBC: 4.87 MIL/uL (ref 3.87–5.11)
RDW: 12.3 % (ref 11.5–15.5)
WBC: 7.8 10*3/uL (ref 4.0–10.5)

## 2014-02-28 LAB — BASIC METABOLIC PANEL
Anion gap: 16 — ABNORMAL HIGH (ref 5–15)
BUN: 13 mg/dL (ref 6–23)
CALCIUM: 9.6 mg/dL (ref 8.4–10.5)
CO2: 25 mEq/L (ref 19–32)
Chloride: 97 mEq/L (ref 96–112)
Creatinine, Ser: 0.79 mg/dL (ref 0.50–1.10)
GFR calc Af Amer: >90 mL/min (ref >90)
GLUCOSE: 103 mg/dL — AB (ref 70–99)
Potassium: 4.1 mEq/L (ref 3.7–5.3)
Sodium: 138 mEq/L (ref 137–147)

## 2014-02-28 LAB — PREGNANCY, URINE: Preg Test, Ur: NEGATIVE

## 2014-02-28 MED ORDER — ONDANSETRON HCL 4 MG/2ML IJ SOLN
4.0000 mg | Freq: Once | INTRAMUSCULAR | Status: AC
  Administered 2014-02-28: 4 mg via INTRAVENOUS
  Filled 2014-02-28: qty 2

## 2014-02-28 MED ORDER — OXYCODONE-ACETAMINOPHEN 5-325 MG PO TABS
1.0000 | ORAL_TABLET | ORAL | Status: DC | PRN
Start: 2014-02-28 — End: 2014-12-09

## 2014-02-28 MED ORDER — MORPHINE SULFATE 4 MG/ML IJ SOLN
4.0000 mg | Freq: Once | INTRAMUSCULAR | Status: AC
  Administered 2014-02-28: 4 mg via INTRAVENOUS
  Filled 2014-02-28: qty 1

## 2014-02-28 MED ORDER — SODIUM CHLORIDE 0.9 % IV BOLUS (SEPSIS)
1000.0000 mL | Freq: Once | INTRAVENOUS | Status: AC
  Administered 2014-02-28: 1000 mL via INTRAVENOUS

## 2014-02-28 MED ORDER — NAPROXEN 375 MG PO TABS: 375.0000 mg | ORAL_TABLET | Freq: Two times a day (BID) | ORAL | Status: DC

## 2014-02-28 MED ORDER — OXYCODONE-ACETAMINOPHEN 5-325 MG PO TABS
1.0000 | ORAL_TABLET | Freq: Once | ORAL | Status: AC
  Administered 2014-02-28: 1 via ORAL
  Filled 2014-02-28: qty 1

## 2014-02-28 MED ORDER — ONDANSETRON 8 MG PO TBDP: ORAL_TABLET | ORAL | Status: DC

## 2014-02-28 NOTE — ED Provider Notes (Signed)
Care taken over from [facility]. She has a history of kidney stones and came in with left flank pain and associated hematuria. She was sent over here from urgent care. She states the pain feels similar to her past kidney stones. She currently is pain-free. She has no ongoing nausea or vomiting. Her KUB did not show a kidney stone although it likely is limited due to her body habitus. I discussed with her the need to do a CT scan. Currently she has no abdominal tenderness or complaints of abdominal pain. Given this I feel that she can be discharged without doing CT imaging in patient is comfortable with this plan. Her symptoms sound consistent with her past renal colic. Her urine does not show signs of infection. She's discharged with a prescription for Percocet and Zofran. She was encouraged to followup at [facility] who she's seen in the past. She was advised to return here if her symptoms worsen.  Results for orders placed during the hospital encounter of 02/28/14  CBC WITH DIFFERENTIAL      Result Value Ref Range   WBC 7.8  4.0 - 10.5 K/uL   RBC 4.87  3.87 - 5.11 MIL/uL   Hemoglobin 13.9  12.0 - 15.0 g/dL   HCT 16.1  09.6 - 04.5 %   MCV 85.2  78.0 - 100.0 fL   MCH 28.5  26.0 - 34.0 pg   MCHC 33.5  30.0 - 36.0 g/dL   RDW 40.9  81.1 - 91.4 %   Platelets 363  150 - 400 K/uL   Neutrophils Relative % 42 (*) 43 - 77 %   Neutro Abs 3.2  1.7 - 7.7 K/uL   Lymphocytes Relative 51 (*) 12 - 46 %   Lymphs Abs 3.9  0.7 - 4.0 K/uL   Monocytes Relative 6  3 - 12 %   Monocytes Absolute 0.5  0.1 - 1.0 K/uL   Eosinophils Relative 1  0 - 5 %   Eosinophils Absolute 0.1  0.0 - 0.7 K/uL   Basophils Relative 0  0 - 1 %   Basophils Absolute 0.0  0.0 - 0.1 K/uL  BASIC METABOLIC PANEL      Result Value Ref Range   Sodium 138  137 - 147 mEq/L   Potassium 4.1  3.7 - 5.3 mEq/L   Chloride 97  96 - 112 mEq/L   CO2 25  19 - 32 mEq/L   Glucose, Bld 103 (*) 70 - 99 mg/dL   BUN 13  6 - 23 mg/dL   Creatinine,  Ser 7.82  0.50 - 1.10 mg/dL   Calcium 9.6  8.4 - 95.6 mg/dL   GFR calc non Af Amer >90  >90 mL/min   GFR calc Af Amer >90  >90 mL/min   Anion gap 16 (*) 5 - 15  URINALYSIS, ROUTINE W REFLEX MICROSCOPIC      Result Value Ref Range   Color, Urine YELLOW  YELLOW   APPearance CLOUDY (*) CLEAR   Specific Gravity, Urine 1.027  1.005 - 1.030   pH 5.5  5.0 - 8.0   Glucose, UA NEGATIVE  NEGATIVE mg/dL   Hgb urine dipstick NEGATIVE  NEGATIVE   Bilirubin Urine SMALL (*) NEGATIVE   Ketones, ur NEGATIVE  NEGATIVE mg/dL   Protein, ur NEGATIVE  NEGATIVE mg/dL   Urobilinogen, UA 0.2  0.0 - 1.0 mg/dL   Nitrite NEGATIVE  NEGATIVE   Leukocytes, UA NEGATIVE  NEGATIVE  PREGNANCY, URINE  Result Value Ref Range   Preg Test, Ur NEGATIVE  NEGATIVE   Dg Abd 1 View  02/28/2014   CLINICAL DATA:  Left flank pain for 7 days.  EXAM: ABDOMEN - 1 VIEW  COMPARISON:  08/14/2013  FINDINGS: No radiographic evidence of renal or ureteral stones. Kidney shadows partly obscured by bowel gas.  There is a small metal cylinder in the left lower quadrant which was present previously. It is unchanged.  Normal bowel gas pattern. No obstruction or generalized adynamic ileus.  Bony structures are unremarkable.  IMPRESSION: No convincing renal or ureteral stones. The lower pole of the left kidney is obscured by bowel gas. This is the location where there were renal stones on the prior study.  No acute findings.   Electronically Signed   By: Amie Portland M.D.   On: 02/28/2014 16:47      Rolan Bucco, MD 02/28/14 1719

## 2014-02-28 NOTE — ED Notes (Signed)
Patient asked for urine sample. She states that she needs something to drink, before she can give sample.

## 2014-02-28 NOTE — ED Notes (Signed)
Patient transported to X-ray 

## 2014-02-28 NOTE — ED Notes (Signed)
Pt  Reports  Pain  l  Side  Of  Her  Back    X  5  Days           denys  Any      Injury          X  5  Days    denys  Any  specefic    inj            Pain is  Worse  On  Movement

## 2014-02-28 NOTE — ED Provider Notes (Addendum)
CSN: [id number]     Arrival date & time 02/28/14  1020 History   First MD Initiated Contact with Patient 02/28/14 1054     Chief Complaint  Patient presents with  . Flank Pain   (Consider location/radiation/quality/duration/timing/severity/associated sxs/prior Treatment) Patient is a 38 y.o. female presenting with flank pain. The history is provided by the patient.  Flank Pain This is a new problem. The current episode started more than 2 days ago. The problem has been gradually worsening. Pertinent negatives include no abdominal pain. Associated symptoms comments: Flank pain , h/o kidney stone in 2013, no nausea..    Past Medical History  Diagnosis Date  . Hypertension   . ANEMIA 08/14/2008    Qualifier: Diagnosis of  By: Karn Pickler MD, Shanda Bumps    . Bipolar 1 disorder   . Kidney stones 10/09/11  . History of bronchitis   . Blood transfusion   . Migraines 10/09/11    "often"  . GERD (gastroesophageal reflux disease)   . Anxiety   . Depression    Past Surgical History  Procedure Laterality Date  . Endometrial ablation  ~ 2011  . Tubal ligation  2006  . Fracture surgery    . Ankle fracture surgery   1990's    "left; put screws in"  . Gastric by pass     History reviewed. No pertinent family history. History  Substance Use Topics  . Smoking status: Former Smoker -- 0.25 packs/day for 4 years    Types: Cigarettes    Quit date: 06/12/2004  . Smokeless tobacco: Never Used  . Alcohol Use: No   OB History   Grav Para Term Preterm Abortions TAB SAB Ect Mult Living                 Review of Systems  Constitutional: Negative.   Gastrointestinal: Negative for nausea, abdominal pain and diarrhea.  Genitourinary: Positive for flank pain. Negative for urgency and hematuria.    Allergies  Sibutramine hcl monohydrate  Home Medications   Prior to Admission medications   Medication Sig Start Date End Date Taking? Authorizing Provider  CALCIUM PO Take 1 tablet by mouth 3 (three)  times daily.    Historical Provider, MD  Ciclopirox 1 % shampoo Shampoo daily. 12/05/13   Tommie Sams, DO  citalopram (CELEXA) 40 MG tablet Take 1 tablet (40 mg total) by mouth daily. 12/05/13   Tommie Sams, DO  desonide (DESOWEN) 0.05 % cream Apply to affected areas of face twice daily.  Do not use for more than 14 days consecutively. 11/11/12   Tommie Sams, DO  desonide (DESOWEN) 0.05 % ointment Apply 1 application topically 2 (two) times daily. 12/05/13   Tommie Sams, DO  Fluocinolone Acetonide 0.01 % SHAM Apply no more than 1 ounce to scalp once daily; work into [employer] and allow to remain on scalp for ~5 minutes. Remove from hair and scalp by rinsing thoroughly with water. 12/05/13   Tommie Sams, DO  fluticasone (FLONASE) 50 MCG/ACT nasal spray Place 2 sprays into the nose daily. 11/07/12   Tommie Sams, DO  hydrochlorothiazide (HYDRODIURIL) 25 MG tablet TAKE 1 TABLET BY MOUTH EVERY DAY 12/05/13   Tommie Sams, DO  hydrOXYzine (ATARAX/VISTARIL) 25 MG tablet Take 1 tablet (25 mg total) by mouth 4 (four) times daily as needed. itching 12/05/13   Tommie Sams, DO  IRON PO Take 1 tablet by mouth daily.    Historical Provider, MD  Multiple  Vitamin (MULTIVITAMIN WITH MINERALS) TABS Take 1 tablet by mouth 3 (three) times daily.    Historical Provider, MD  omeprazole (PRILOSEC) 20 MG capsule Take 1 capsule (20 mg total) by mouth daily. 12/05/13   Tommie Sams, DO  polyethylene glycol (MIRALAX / GLYCOLAX) packet Take 17 g by mouth daily as needed. Constipation 11/07/12   Jayce G Cook, DO   BP 137/90  Pulse 70  Temp(Src) 97.1 F (36.2 C) (Oral)  Resp 20  SpO2 97% Physical Exam  Nursing note and vitals reviewed. Constitutional: She is oriented to person, place, and time. She appears well-developed and well-nourished. She appears distressed.  HENT:  Head: Normocephalic.  Cardiovascular: Normal heart sounds.   Pulmonary/Chest: Breath sounds normal.  Abdominal: Soft. Bowel sounds are normal. She exhibits  no distension and no mass. There is no tenderness. There is no rebound.  Neurological: She is alert and oriented to person, place, and time.  Skin: Skin is warm and dry.    ED Course  Procedures (including critical care time) Labs Review Labs Reviewed  POCT URINALYSIS DIP (DEVICE) - Abnormal; Notable for the following:    Hgb urine dipstick MODERATE (*)    Leukocytes, UA TRACE (*)    All other components within normal limits    Imaging Review No results found.   MDM   1. Acute left flank pain    Sent for hematuria and flank pain with h/o stones.    Linna Hoff, MD 02/28/14 1109  Linna Hoff, MD 02/28/14 [id number]

## 2014-02-28 NOTE — ED Provider Notes (Signed)
CSN: [id number]     Arrival date & time 02/28/14  1135 History   First MD Initiated Contact with Patient 02/28/14 1328     Chief Complaint  Patient presents with  . Flank Pain     (Consider location/radiation/quality/duration/timing/severity/associated sxs/prior Treatment) HPI Comments: Patient is a 38 year old female past no history of renal calculi, bipolar disorder, presents emergency room chief complaint of left flank pain for 5 days. Patient was sent from urgent care due to hematuria and questionable kidney stone. Pt reports intermit discomfort, worsening, non-radiating. Symptoms aggravated by movement.The patient reports similar symptoms in the past previous kidney stones. Last stone approximately 2 years ago followed by [facility]. Last normal muscle. Approximately one year ago reports approximately 1 menses per year since uterine ablation.  Patient is a 38 y.o. female presenting with flank pain. The history is provided by the patient. No language interpreter was used.  Flank Pain Associated symptoms include nausea. Pertinent negatives include no abdominal pain, chills, fever or vomiting.    Past Medical History  Diagnosis Date  . Hypertension   . ANEMIA 08/14/2008    Qualifier: Diagnosis of  By: Karn Pickler MD, Shanda Bumps    . Bipolar 1 disorder   . Kidney stones 10/09/11  . History of bronchitis   . Blood transfusion   . Migraines 10/09/11    "often"  . GERD (gastroesophageal reflux disease)   . Anxiety   . Depression    Past Surgical History  Procedure Laterality Date  . Endometrial ablation  ~ 2011  . Tubal ligation  2006  . Fracture surgery    . Ankle fracture surgery   1990's    "left; put screws in"  . Gastric by pass     No family history on file. History  Substance Use Topics  . Smoking status: Former Smoker -- 0.25 packs/day for 4 years    Types: Cigarettes    Quit date: 06/12/2004  . Smokeless tobacco: Never Used  . Alcohol Use: No   OB History   Grav Para Term  Preterm Abortions TAB SAB Ect Mult Living                 Review of Systems  Constitutional: Negative for fever and chills.  Gastrointestinal: Positive for nausea. Negative for vomiting, abdominal pain, diarrhea and constipation.  Genitourinary: Positive for hematuria and flank pain.      Allergies  Sibutramine hcl monohydrate  Home Medications   Prior to Admission medications   Medication Sig Start Date End Date Taking? Authorizing Provider  CALCIUM PO Take 1 tablet by mouth 3 (three) times daily.    Historical Provider, MD  Ciclopirox 1 % shampoo Shampoo daily. 12/05/13   Tommie Sams, DO  citalopram (CELEXA) 40 MG tablet Take 1 tablet (40 mg total) by mouth daily. 12/05/13   Tommie Sams, DO  desonide (DESOWEN) 0.05 % cream Apply to affected areas of face twice daily.  Do not use for more than 14 days consecutively. 11/11/12   Tommie Sams, DO  desonide (DESOWEN) 0.05 % ointment Apply 1 application topically 2 (two) times daily. 12/05/13   Tommie Sams, DO  Fluocinolone Acetonide 0.01 % SHAM Apply no more than 1 ounce to scalp once daily; work into [employer] and allow to remain on scalp for ~5 minutes. Remove from hair and scalp by rinsing thoroughly with water. 12/05/13   Tommie Sams, DO  fluticasone (FLONASE) 50 MCG/ACT nasal spray Place 2 sprays into the nose  daily. 11/07/12   Tommie Sams, DO  hydrochlorothiazide (HYDRODIURIL) 25 MG tablet TAKE 1 TABLET BY MOUTH EVERY DAY 12/05/13   Tommie Sams, DO  hydrOXYzine (ATARAX/VISTARIL) 25 MG tablet Take 1 tablet (25 mg total) by mouth 4 (four) times daily as needed. itching 12/05/13   Tommie Sams, DO  IRON PO Take 1 tablet by mouth daily.    Historical Provider, MD  Multiple Vitamin (MULTIVITAMIN WITH MINERALS) TABS Take 1 tablet by mouth 3 (three) times daily.    Historical Provider, MD  omeprazole (PRILOSEC) 20 MG capsule Take 1 capsule (20 mg total) by mouth daily. 12/05/13   Tommie Sams, DO  polyethylene glycol (MIRALAX / GLYCOLAX) packet  Take 17 g by mouth daily as needed. Constipation 11/07/12   Tommie Sams, DO   BP 127/88  Pulse 86  Temp(Src) 97.9 F (36.6 C) (Oral)  Resp 14  Ht  (1.854 m)  Wt 430 lb (195.047 kg)  BMI 56.74 kg/m2  SpO2 97% Physical Exam  Nursing note and vitals reviewed. Constitutional: She is oriented to person, place, and time. She appears well-developed and well-nourished. No distress.  HENT:  Head: Normocephalic and atraumatic.  Eyes: EOM are normal.  Neck: Neck supple.  Cardiovascular: Normal rate and regular rhythm.   Pulmonary/Chest: Effort normal and breath sounds normal. She has no wheezes. She has no rales.  Abdominal: Soft. There is no tenderness. There is no rigidity, no rebound, no guarding and no CVA tenderness.  Obese abdomen.   Musculoskeletal: Normal range of motion.  Neurological: She is alert and oriented to person, place, and time.  Skin: Skin is warm and dry. She is not diaphoretic.  Psychiatric: She has a normal mood and affect. Her behavior is normal.    ED Course  Procedures (including critical care time) Labs Review Results for orders placed during the hospital encounter of 02/28/14  CBC WITH DIFFERENTIAL      Result Value Ref Range   WBC 7.8  4.0 - 10.5 K/uL   RBC 4.87  3.87 - 5.11 MIL/uL   Hemoglobin 13.9  12.0 - 15.0 g/dL   HCT 16.1  09.6 - 04.5 %   MCV 85.2  78.0 - 100.0 fL   MCH 28.5  26.0 - 34.0 pg   MCHC 33.5  30.0 - 36.0 g/dL   RDW 40.9  81.1 - 91.4 %   Platelets 363  150 - 400 K/uL   Neutrophils Relative % 42 (*) 43 - 77 %   Neutro Abs 3.2  1.7 - 7.7 K/uL   Lymphocytes Relative 51 (*) 12 - 46 %   Lymphs Abs 3.9  0.7 - 4.0 K/uL   Monocytes Relative 6  3 - 12 %   Monocytes Absolute 0.5  0.1 - 1.0 K/uL   Eosinophils Relative 1  0 - 5 %   Eosinophils Absolute 0.1  0.0 - 0.7 K/uL   Basophils Relative 0  0 - 1 %   Basophils Absolute 0.0  0.0 - 0.1 K/uL  BASIC METABOLIC PANEL      Result Value Ref Range   Sodium 138  137 - 147 mEq/L   Potassium  4.1  3.7 - 5.3 mEq/L   Chloride 97  96 - 112 mEq/L   CO2 25  19 - 32 mEq/L   Glucose, Bld 103 (*) 70 - 99 mg/dL   BUN 13  6 - 23 mg/dL   Creatinine, Ser 7.82  0.50 - 1.10  mg/dL   Calcium 9.6  8.4 - 16.1 mg/dL   GFR calc non Af Amer >90  >90 mL/min   GFR calc Af Amer >90  >90 mL/min   Anion gap 16 (*) 5 - 15  URINALYSIS, ROUTINE W REFLEX MICROSCOPIC      Result Value Ref Range   Color, Urine YELLOW  YELLOW   APPearance CLOUDY (*) CLEAR   Specific Gravity, Urine 1.027  1.005 - 1.030   pH 5.5  5.0 - 8.0   Glucose, UA NEGATIVE  NEGATIVE mg/dL   Hgb urine dipstick NEGATIVE  NEGATIVE   Bilirubin Urine SMALL (*) NEGATIVE   Ketones, ur NEGATIVE  NEGATIVE mg/dL   Protein, ur NEGATIVE  NEGATIVE mg/dL   Urobilinogen, UA 0.2  0.0 - 1.0 mg/dL   Nitrite NEGATIVE  NEGATIVE   Leukocytes, UA NEGATIVE  NEGATIVE  PREGNANCY, URINE      Result Value Ref Range   Preg Test, Ur NEGATIVE  NEGATIVE   No results found. Imaging Review No results found.   EKG Interpretation None      MDM   Final diagnoses:  Renal colic on left side   Patient presents via urgent care for questionable kidney stone, no CVA tenderness no abdominal discomfort with palpation, exam limited by body habitus. UA outside showed hemoglobin and leukocytes will recorder for pregnancy and microscopic.  UA without hemoglobin negative pregnancy no concerning abnormalities no leukocytosis. 1550: Reevaluation patient reports resolution of pain reports persistent nausea without emesis. Will order a KUB to evaluate for stone. Pt care assumed by Dr. Fredderick Phenix at shift change awaiting KUB, likely discharge home with Urology follow up.  Mellody Drown, PA-C 03/01/14 [phone]

## 2014-02-28 NOTE — ED Notes (Signed)
Left flank pain. Some hematuria. Hx. Kidney stones. No n/v. Transfer form ucc. ua done.

## 2014-02-28 NOTE — Discharge Instructions (Signed)

## 2014-03-03 NOTE — ED Provider Notes (Signed)
Medical screening examination/treatment/procedure(s) were performed by non-physician practitioner and as supervising physician I was immediately available for consultation/collaboration.   EKG Interpretation None       Karen Brennan. Rubin Payor, MD 03/03/14 2350

## 2014-03-23 ENCOUNTER — Ambulatory Visit: Payer: Self-pay | Admitting: Family Medicine

## 2014-03-25 ENCOUNTER — Other Ambulatory Visit: Payer: Self-pay | Admitting: Family Medicine

## 2014-03-25 NOTE — Telephone Encounter (Signed)
Patient called to inquire about rx for scalp.  Missed her appt on Monday but resch for 10/29.  Would like to have rx filled until appt

## 2014-04-09 ENCOUNTER — Ambulatory Visit: Payer: Self-pay | Admitting: Family Medicine

## 2014-05-13 ENCOUNTER — Ambulatory Visit: Payer: Medicaid Other | Admitting: *Deleted

## 2014-05-13 ENCOUNTER — Ambulatory Visit (INDEPENDENT_AMBULATORY_CARE_PROVIDER_SITE_OTHER): Payer: Medicaid Other | Admitting: *Deleted

## 2014-05-13 DIAGNOSIS — Z23 Encounter for immunization: Secondary | ICD-10-CM

## 2014-05-20 ENCOUNTER — Emergency Department (HOSPITAL_COMMUNITY)
Admission: EM | Admit: 2014-05-20 | Discharge: 2014-05-20 | Disposition: A | Discharge status: Home / Self Care | Payer: Medicaid Other | Attending: Emergency Medicine | Admitting: Emergency Medicine

## 2014-05-20 ENCOUNTER — Encounter (HOSPITAL_COMMUNITY): Payer: Self-pay

## 2014-05-20 ENCOUNTER — Emergency Department (HOSPITAL_COMMUNITY): Payer: Medicaid Other

## 2014-05-20 DIAGNOSIS — Z87442 Personal history of urinary calculi: Secondary | ICD-10-CM | POA: Insufficient documentation

## 2014-05-20 DIAGNOSIS — Z87891 Personal history of nicotine dependence: Secondary | ICD-10-CM | POA: Diagnosis not present

## 2014-05-20 DIAGNOSIS — Z79899 Other long term (current) drug therapy: Secondary | ICD-10-CM | POA: Diagnosis not present

## 2014-05-20 DIAGNOSIS — R079 Chest pain, unspecified: Secondary | ICD-10-CM | POA: Insufficient documentation

## 2014-05-20 DIAGNOSIS — Z7952 Long term (current) use of systemic steroids: Secondary | ICD-10-CM | POA: Insufficient documentation

## 2014-05-20 DIAGNOSIS — Z7951 Long term (current) use of inhaled steroids: Secondary | ICD-10-CM | POA: Insufficient documentation

## 2014-05-20 DIAGNOSIS — D649 Anemia, unspecified: Secondary | ICD-10-CM | POA: Insufficient documentation

## 2014-05-20 DIAGNOSIS — M791 Myalgia, unspecified site: Secondary | ICD-10-CM

## 2014-05-20 DIAGNOSIS — F419 Anxiety disorder, unspecified: Secondary | ICD-10-CM | POA: Insufficient documentation

## 2014-05-20 DIAGNOSIS — F319 Bipolar disorder, unspecified: Secondary | ICD-10-CM | POA: Diagnosis not present

## 2014-05-20 DIAGNOSIS — K219 Gastro-esophageal reflux disease without esophagitis: Secondary | ICD-10-CM | POA: Diagnosis not present

## 2014-05-20 DIAGNOSIS — I10 Essential (primary) hypertension: Secondary | ICD-10-CM | POA: Diagnosis not present

## 2014-05-20 LAB — BASIC METABOLIC PANEL
Anion gap: 13 (ref 5–15)
BUN: 7 mg/dL (ref 6–23)
CO2: 29 meq/L (ref 19–32)
Calcium: 8.9 mg/dL (ref 8.4–10.5)
Chloride: 98 mEq/L (ref 96–112)
Creatinine, Ser: 0.78 mg/dL (ref 0.50–1.10)
GFR calc Af Amer: >90 mL/min (ref >90)
Glucose, Bld: 82 mg/dL (ref 70–99)
Potassium: 3.4 mEq/L — ABNORMAL LOW (ref 3.7–5.3)
Sodium: 140 mEq/L (ref 137–147)

## 2014-05-20 LAB — CBC
HEMATOCRIT: 39.6 % (ref 36.0–46.0)
Hemoglobin: 13 g/dL (ref 12.0–15.0)
MCH: 29.1 pg (ref 26.0–34.0)
MCHC: 32.8 g/dL (ref 30.0–36.0)
MCV: 88.8 fL (ref 78.0–100.0)
Platelets: 361 10*3/uL (ref 150–400)
RBC: 4.46 MIL/uL (ref 3.87–5.11)
RDW: 12.8 % (ref 11.5–15.5)
WBC: 9.8 10*3/uL (ref 4.0–10.5)

## 2014-05-20 LAB — I-STAT TROPONIN, ED: Troponin i, poc: 0 ng/mL (ref 0.00–0.08)

## 2014-05-20 LAB — PRO B NATRIURETIC PEPTIDE: Pro B Natriuretic peptide (BNP): 172.7 pg/mL — ABNORMAL HIGH (ref 0–125)

## 2014-05-20 MED ORDER — TRAMADOL HCL 50 MG PO TABS: 50.0000 mg | ORAL_TABLET | Freq: Four times a day (QID) | ORAL | Status: DC | PRN

## 2014-05-20 MED ORDER — SODIUM CHLORIDE 0.9 % IV SOLN
1000.0000 mL | Freq: Once | INTRAVENOUS | Status: AC
  Administered 2014-05-20: 1000 mL via INTRAVENOUS

## 2014-05-20 MED ORDER — KETOROLAC TROMETHAMINE 30 MG/ML IJ SOLN
30.0000 mg | Freq: Once | INTRAMUSCULAR | Status: AC
  Administered 2014-05-20: 30 mg via INTRAVENOUS
  Filled 2014-05-20: qty 1

## 2014-05-20 MED ORDER — SODIUM CHLORIDE 0.9 % IV SOLN
1000.0000 mL | INTRAVENOUS | Status: DC
  Administered 2014-05-20: 1000 mL via INTRAVENOUS

## 2014-05-20 NOTE — ED Provider Notes (Signed)
CSN: [id number]     Arrival date & time 05/20/14  0022 History   First MD Initiated Contact with Patient 05/20/14 0142     Chief Complaint  Patient presents with  . Chest Pain     (Consider location/radiation/quality/duration/timing/severity/associated sxs/prior Treatment) Patient is a 38 y.o. female presenting with chest pain. The history is provided by the patient.  Chest Pain She states that she got a flu shot 6 days ago. 4 days ago, she started having generalized body aches including achy pain in her chest. She has not had any fever, chills, sweats. There's been some sneezing but no coughing. She denies nausea, vomiting, diarrhea. She has tried to treat herself with OTC analgesics, which have given slight, temporary relief. She denies any sick contacts. Pain is rated at 5/10.  Past Medical History  Diagnosis Date  . Hypertension   . ANEMIA 08/14/2008    Qualifier: Diagnosis of  By: Karn Picklerriche MD, Shanda BumpsJessica    . Bipolar 1 disorder   . Kidney stones 10/09/11  . History of bronchitis   . Blood transfusion   . Migraines 10/09/11    "often"  . GERD (gastroesophageal reflux disease)   . Anxiety   . Depression    Past Surgical History  Procedure Laterality Date  . Endometrial ablation  ~ 2011  . Tubal ligation  2006  . Fracture surgery    . Ankle fracture surgery   1990's    "left; put screws in"  . Gastric by pass     History reviewed. No pertinent family history. History  Substance Use Topics  . Smoking status: Former Smoker -- 0.25 packs/day for 4 years    Types: Cigarettes    Quit date: 06/12/2004  . Smokeless tobacco: Never Used  . Alcohol Use: No   OB History    No data available     Review of Systems  Cardiovascular: Positive for chest pain.  All other systems reviewed and are negative.     Allergies  Sibutramine hcl monohydrate  Home Medications   Prior to Admission medications   Medication Sig Start Date End Date Taking? Authorizing Provider  busPIRone  (BUSPAR) 15 MG tablet Take 15 mg by mouth 2 (two) times daily. 04/09/14  Yes Historical Provider, MD  citalopram (CELEXA) 40 MG tablet Take 40 mg by mouth daily.   Yes Historical Provider, MD  hydrochlorothiazide (HYDRODIURIL) 25 MG tablet Take 25 mg by mouth daily.   Yes Historical Provider, MD  hydrOXYzine (ATARAX/VISTARIL) 25 MG tablet Take 25 mg by mouth 2 (two) times daily.   Yes Historical Provider, MD  OLANZapine (ZYPREXA) 20 MG tablet Take 20 mg by mouth at bedtime.   Yes Historical Provider, MD  CALCIUM PO Take 1 tablet by mouth 3 (three) times daily.    Historical Provider, MD  Ciclopirox 1 % shampoo Apply 1 application topically at bedtime.    Historical Provider, MD  desonide (DESOWEN) 0.05 % ointment Apply 1 application topically daily.    Historical Provider, MD  ferrous sulfate 325 (65 FE) MG tablet Take 325 mg by mouth daily.    Historical Provider, MD  Fluocinolone Acetonide (DERMA-SMOOTHE/FS SCALP) 0.01 % OIL APPLY NO MORE THAN 1 OZ TO SCALP ONCE DAILY, LEAVE ON SCALP FOR 5 MINS, THEN RINSE Patient not taking: Reported on 05/20/2014 03/25/14   Tommie SamsJayce G Cook, DO  Fluocinolone Acetonide 0.01 % SHAM Apply 1 application topically daily.    Historical Provider, MD  fluticasone (FLONASE) 50 MCG/ACT nasal spray  Place 1 spray into both nostrils daily.    Historical Provider, MD  Multiple Vitamin (MULTIVITAMIN WITH MINERALS) TABS Take 1 tablet by mouth 3 (three) times daily.    Historical Provider, MD  omeprazole (PRILOSEC) 20 MG capsule Take 20 mg by mouth daily.    Historical Provider, MD  ondansetron (ZOFRAN ODT) 8 MG disintegrating tablet 8mg  ODT q4 hours prn nausea Patient not taking: Reported on 05/20/2014 02/28/14   Rolan BuccoMelanie Belfi, MD  oxyCODONE-acetaminophen (PERCOCET) 5-325 MG per tablet Take 1-2 tablets by mouth every 4 (four) hours as needed. Patient not taking: Reported on 05/20/2014 02/28/14   Rolan BuccoMelanie Belfi, MD  polyethylene glycol (MIRALAX / GLYCOLAX) packet Take 17 g by mouth  daily as needed for mild constipation.    Historical Provider, MD   BP 122/51 mmHg  Pulse 84  Temp(Src) 98.3 F (36.8 C) (Oral)  Resp 19  Ht 6\' 1"  (1.854 m)  Wt 450 lb (204.119 kg)  BMI 59.38 kg/m2  SpO2 100% Physical Exam  Nursing note and vitals reviewed.  Morbidly obese 38 year old female, resting comfortably and in no acute distress. Vital signs are normal. Oxygen saturation is 100%, which is normal. Head is normocephalic and atraumatic. PERRLA, EOMI. Oropharynx is clear. Neck is nontender and supple without adenopathy or JVD. Back is nontender and there is no CVA tenderness. Lungs are clear without rales, wheezes, or rhonchi. Chest is nontender. Heart has regular rate and rhythm without murmur. Abdomen is soft, flat, nontender without masses or hepatosplenomegaly and peristalsis is normoactive. Extremities have no cyanosis or edema, full range of motion is present. Skin is warm and dry without rash. Neurologic: Mental status is normal, cranial nerves are intact, there are no motor or sensory deficits.  ED Course  Procedures (including critical care time) Labs Review Results for orders placed or performed during the hospital encounter of 05/20/14  CBC  Result Value Ref Range   WBC 9.8 4.0 - 10.5 K/uL   RBC 4.46 3.87 - 5.11 MIL/uL   Hemoglobin 13.0 12.0 - 15.0 g/dL   HCT 96.039.6 45.436.0 - 09.846.0 %   MCV 88.8 78.0 - 100.0 fL   MCH 29.1 26.0 - 34.0 pg   MCHC 32.8 30.0 - 36.0 g/dL   RDW 11.912.8 14.711.5 - 82.915.5 %   Platelets 361 150 - 400 K/uL  Basic metabolic panel  Result Value Ref Range   Sodium 140 137 - 147 mEq/L   Potassium 3.4 (L) 3.7 - 5.3 mEq/L   Chloride 98 96 - 112 mEq/L   CO2 29 19 - 32 mEq/L   Glucose, Bld 82 70 - 99 mg/dL   BUN 7 6 - 23 mg/dL   Creatinine, Ser 5.620.78 0.50 - 1.10 mg/dL   Calcium 8.9 8.4 - 13.010.5 mg/dL   GFR calc non Af Amer >90 >90 mL/min   GFR calc Af Amer >90 >90 mL/min   Anion gap 13 5 - 15  BNP (order ONLY if patient complains of dyspnea/SOB AND you  have documented it for THIS visit)  Result Value Ref Range   Pro B Natriuretic peptide (BNP) 172.7 (H) 0 - 125 pg/mL  I-stat troponin, ED (not at [facility])  Result Value Ref Range   Troponin i, poc 0.00 0.00 - 0.08 ng/mL   Comment 3           Imaging Review Dg Chest Port 1 View  05/20/2014   CLINICAL DATA:  Mid chest pain.  EXAM: PORTABLE CHEST - 1 VIEW  COMPARISON:  07/23/2012  FINDINGS: The heart size and mediastinal contours are within normal limits. Both lungs are clear. The visualized skeletal structures are unremarkable.  IMPRESSION: No active disease.   Electronically Signed   By: Burman Nieves M.D.   On: 05/20/2014 00:43     EKG Interpretation   Date/Time:  Wednesday May 20 2014 00:25:35 EST Ventricular Rate:  97 PR Interval:  166 QRS Duration: 86 QT Interval:  378 QTC Calculation: 480 R Axis:   45 Text Interpretation:  Normal sinus rhythm Prolonged QT (borderline)  Abnormal ECG When compared with ECG of 06/27/2013, No significant change  was found Confirmed by [facility]  MD, Ginna Schuur ([id number]) on 05/20/2014 2:02:38 AM      MDM   Final diagnoses:  Chest pain, unspecified chest pain type  Myalgia    Symptom complex consistent with viral illness. Laboratory evaluation and chest x-ray are unremarkable. She will be given IV fluids and ketorolac and reassessed.  She noted no significant improvement with above noted treatment. This is not surprising since she had not responded well to NSAIDs at home. She is discharged with prescription for tramadol for pain and is to follow-up with her PCP.  Dione Booze, MD 05/20/14 [phone]

## 2014-05-20 NOTE — ED Notes (Signed)
Discharge instructions and prescription reviewed voiced understanding.  Patient ambulated to the exit with steady gait.

## 2014-05-20 NOTE — Discharge Instructions (Signed)
Drink plenty of fluids. Take acetaminophen or ibuprofen as needed for less severe pain.  Chest Pain (Nonspecific) It is often hard to give a specific diagnosis for the cause of chest pain. There is always a chance that your pain could be related to something serious, such as a heart attack or a blood clot in the lungs. You need to follow up with your health care provider for further evaluation. CAUSES   Heartburn.  Pneumonia or bronchitis.  Anxiety or stress.  Inflammation around your heart (pericarditis) or lung (pleuritis or pleurisy).  A blood clot in the lung.  A collapsed lung (pneumothorax). It can develop suddenly on its own (spontaneous pneumothorax) or from trauma to the chest.  Shingles infection (herpes zoster virus). The chest wall is composed of bones, muscles, and cartilage. Any of these can be the source of the pain.  The bones can be bruised by injury.  The muscles or cartilage can be strained by coughing or overwork.  The cartilage can be affected by inflammation and become sore (costochondritis). DIAGNOSIS  Lab tests or other studies may be needed to find the cause of your pain. Your health care provider may have you take a test called an ambulatory electrocardiogram (ECG). An ECG records your heartbeat patterns over a 24-hour period. You may also have other tests, such as:  Transthoracic echocardiogram (TTE). During echocardiography, sound waves are used to evaluate how blood flows through your heart.  Transesophageal echocardiogram (TEE).  Cardiac monitoring. This allows your health care provider to monitor your heart rate and rhythm in real time.  Holter monitor. This is a portable device that records your heartbeat and can help diagnose heart arrhythmias. It allows your health care provider to track your heart activity for several days, if needed.  Stress tests by exercise or by giving medicine that makes the heart beat faster. TREATMENT   Treatment  depends on what may be causing your chest pain. Treatment may include:  Acid blockers for heartburn.  Anti-inflammatory medicine.  Pain medicine for inflammatory conditions.  Antibiotics if an infection is present.  You may be advised to change lifestyle habits. This includes stopping smoking and avoiding alcohol, caffeine, and chocolate.  You may be advised to keep your head raised (elevated) when sleeping. This reduces the chance of acid going backward from your stomach into your esophagus. Most of the time, nonspecific chest pain will improve within 2-3 days with rest and mild pain medicine.  HOME CARE INSTRUCTIONS   If antibiotics were prescribed, take them as directed. Finish them even if you start to feel better.  For the next few days, avoid physical activities that bring on chest pain. Continue physical activities as directed.  Do not use any tobacco products, including cigarettes, chewing tobacco, or electronic cigarettes.  Avoid drinking alcohol.  Only take medicine as directed by your health care provider.  Follow your health care provider's suggestions for further testing if your chest pain does not go away.  Keep any follow-up appointments you made. If you do not go to an appointment, you could develop lasting (chronic) problems with pain. If there is any problem keeping an appointment, call to reschedule. SEEK MEDICAL CARE IF:   Your chest pain does not go away, even after treatment.  You have a rash with blisters on your chest.  You have a fever. SEEK IMMEDIATE MEDICAL CARE IF:   You have increased chest pain or pain that spreads to your arm, neck, jaw, back, or abdomen.  You have shortness of breath.  You have an increasing cough, or you cough up blood.  You have severe back or abdominal pain.  You feel nauseous or vomit.  You have severe weakness.  You faint.  You have chills. This is an emergency. Do not wait to see if the pain will go away. Get  medical help at once. Call your local emergency services (911 in [place] Do not drive yourself to the hospital. MAKE SURE YOU:   Understand these instructions.  Will watch your condition.  Will get help right away if you are not doing well or get worse. Document Released: 03/08/2005 Document Revised: 06/03/2013 Document Reviewed: 01/02/2008 [employer] Patient Information 2015 [place], [place]. This information is not intended to replace advice given to you by your health care provider. Make sure you discuss any questions you have with your health care provider.  Viral Infections A viral infection can be caused by different types of viruses.Most viral infections are not serious and resolve on their own. However, some infections may cause severe symptoms and may lead to further complications. SYMPTOMS Viruses can frequently cause:  Minor sore throat.  Aches and pains.  Headaches.  Runny nose.  Different types of rashes.  Watery eyes.  Tiredness.  Cough.  Loss of appetite.  Gastrointestinal infections, resulting in nausea, vomiting, and diarrhea. These symptoms do not respond to antibiotics because the infection is not caused by bacteria. However, you might catch a bacterial infection following the viral infection. This is sometimes called a "superinfection." Symptoms of such a bacterial infection may include:  Worsening sore throat with pus and difficulty swallowing.  Swollen neck glands.  Chills and a high or persistent fever.  Severe headache.  Tenderness over the sinuses.  Persistent overall ill feeling (malaise), muscle aches, and tiredness (fatigue).  Persistent cough.  Yellow, green, or brown mucus production with coughing. HOME CARE INSTRUCTIONS   Only take over-the-counter or prescription medicines for pain, discomfort, diarrhea, or fever as directed by your caregiver.  Drink enough water and fluids to keep your urine clear or pale yellow. Sports drinks can  provide valuable electrolytes, sugars, and hydration.  Get plenty of rest and maintain proper nutrition. Soups and broths with crackers or rice are fine. SEEK IMMEDIATE MEDICAL CARE IF:   You have severe headaches, shortness of breath, chest pain, neck pain, or an unusual rash.  You have uncontrolled vomiting, diarrhea, or you are unable to keep down fluids.  You or your child has an oral temperature above 102 F (38.9 C), not controlled by medicine.  Your baby is older than 3 months with a rectal temperature of 102 F (38.9 C) or higher.  Your baby is 67 months old or younger with a rectal temperature of 100.4 F (38 C) or higher. MAKE SURE YOU:   Understand these instructions.  Will watch your condition.  Will get help right away if you are not doing well or get worse. Document Released: 03/08/2005 Document Revised: 08/21/2011 Document Reviewed: 10/03/2010 [facility] Patient Information 2015 [place], [place]. This information is not intended to replace advice given to you by your health care provider. Make sure you discuss any questions you have with your health care provider.  Tramadol tablets What is this medicine? TRAMADOL (TRA ma dole) is a pain reliever. It is used to treat moderate to severe pain in adults. This medicine may be used for other purposes; ask your health care provider or pharmacist if you have questions. COMMON BRAND NAME(S): Ultram  What should I tell my health care provider before I take this medicine? They need to know if you have any of these conditions: -brain tumor -depression -drug abuse or addiction -head injury -if you frequently drink alcohol containing drinks -kidney disease or trouble passing urine -liver disease -lung disease, asthma, or breathing problems -seizures or epilepsy -suicidal thoughts, plans, or attempt; a previous suicide attempt by you or a family member -an unusual or allergic reaction to tramadol, codeine, other medicines,  foods, dyes, or preservatives -pregnant or trying to get pregnant -breast-feeding How should I use this medicine? Take this medicine by mouth with a full glass of water. Follow the directions on the prescription label. If the medicine upsets your stomach, take it with food or milk. Do not take more medicine than you are told to take. Talk to your pediatrician regarding the use of this medicine in children. Special care may be needed. Overdosage: If you think you have taken too much of this medicine contact a poison control center or emergency room at once. NOTE: This medicine is only for you. Do not share this medicine with others. What if I miss a dose? If you miss a dose, take it as soon as you can. If it is almost time for your next dose, take only that dose. Do not take double or extra doses. What may interact with this medicine? Do not take this medicine with any of the following medications: -MAOIs like Carbex, Eldepryl, Marplan, Nardil, and Parnate This medicine may also interact with the following medications: -alcohol or medicines that contain alcohol -antihistamines -benzodiazepines -bupropion -carbamazepine or oxcarbazepine -clozapine -cyclobenzaprine -digoxin -furazolidone -linezolid -medicines for depression, anxiety, or psychotic disturbances -medicines for migraine headache like almotriptan, eletriptan, frovatriptan, naratriptan, rizatriptan, sumatriptan, zolmitriptan -medicines for pain like pentazocine, buprenorphine, butorphanol, meperidine, nalbuphine, and propoxyphene -medicines for sleep -muscle relaxants -naltrexone -phenobarbital -phenothiazines like perphenazine, thioridazine, chlorpromazine, mesoridazine, fluphenazine, prochlorperazine, promazine, and trifluoperazine -procarbazine -warfarin This list may not describe all possible interactions. Give your health care provider a list of all the medicines, herbs, non-prescription drugs, or dietary supplements  you use. Also tell them if you smoke, drink alcohol, or use illegal drugs. Some items may interact with your medicine. What should I watch for while using this medicine? Tell your doctor or health care professional if your pain does not go away, if it gets worse, or if you have new or a different type of pain. You may develop tolerance to the medicine. Tolerance means that you will need a higher dose of the medicine for pain relief. Tolerance is normal and is expected if you take this medicine for a long time. Do not suddenly stop taking your medicine because you may develop a severe reaction. Your body becomes used to the medicine. This does NOT mean you are addicted. Addiction is a behavior related to getting and using a drug for a non-medical reason. If you have pain, you have a medical reason to take pain medicine. Your doctor will tell you how much medicine to take. If your doctor wants you to stop the medicine, the dose will be slowly lowered over time to avoid any side effects. You may get drowsy or dizzy. Do not drive, use machinery, or do anything that needs mental alertness until you know how this medicine affects you. Do not stand or sit up quickly, especially if you are an older patient. This reduces the risk of dizzy or fainting spells. Alcohol can increase or decrease the effects of  this medicine. Avoid alcoholic drinks. You may have constipation. Try to have a bowel movement at least every 2 to 3 days. If you do not have a bowel movement for 3 days, call your doctor or health care professional. Your mouth may get dry. Chewing sugarless gum or sucking hard candy, and drinking plenty of water may help. Contact your doctor if the problem does not go away or is severe. What side effects may I notice from receiving this medicine? Side effects that you should report to your doctor or health care professional as soon as possible: -allergic reactions like skin rash, itching or hives, swelling of the  face, lips, or tongue -breathing difficulties, wheezing -confusion -itching -light headedness or fainting spells -redness, blistering, peeling or loosening of the skin, including inside the mouth -seizures Side effects that usually do not require medical attention (report to your doctor or health care professional if they continue or are bothersome): -constipation -dizziness -drowsiness -headache -nausea, vomiting This list may not describe all possible side effects. Call your doctor for medical advice about side effects. You may report side effects to FDA at 1-800-FDA-1088. Where should I keep my medicine? Keep out of the reach of children. Store at room temperature between 15 and 30 degrees C (59 and 86 degrees F). Keep container tightly closed. Throw away any unused medicine after the expiration date. NOTE: This sheet is a summary. It may not cover all possible information. If you have questions about this medicine, talk to your doctor, pharmacist, or health care provider.  2015, Elsevier/Gold Standard. (2010-02-09 11:55:44)

## 2014-05-20 NOTE — ED Notes (Signed)
Pt presents with all over body aches, mid-sternum chest pain radiating to upper anterior neck, SOB,m dizziness, and bilateral knee aching x2 days. Pt states her symptoms increased after receiving the flu 1 week ago

## 2014-08-09 ENCOUNTER — Other Ambulatory Visit: Payer: Self-pay | Admitting: Family Medicine

## 2014-08-17 ENCOUNTER — Ambulatory Visit: Payer: Self-pay | Admitting: Family Medicine

## 2014-10-15 ENCOUNTER — Encounter: Payer: Self-pay | Admitting: *Deleted

## 2014-10-15 NOTE — Progress Notes (Signed)
Prior Authorization received from [facility] for ciclopirox shampoo. Formulary and PA form placed in provider box for completion. Clovis PuMartin, Tamika L, RN

## 2014-10-18 ENCOUNTER — Other Ambulatory Visit: Payer: Self-pay | Admitting: Family Medicine

## 2014-10-18 MED ORDER — CICLOPIROX OLAMINE 0.77 % EX CREA: TOPICAL_CREAM | Freq: Two times a day (BID) | CUTANEOUS | Status: DC

## 2014-10-18 NOTE — Progress Notes (Signed)
Rx sent for the cream in lieu of shampoo.

## 2014-12-09 ENCOUNTER — Ambulatory Visit (INDEPENDENT_AMBULATORY_CARE_PROVIDER_SITE_OTHER): Payer: Medicaid Other | Admitting: Family Medicine

## 2014-12-09 ENCOUNTER — Telehealth: Payer: Self-pay | Admitting: *Deleted

## 2014-12-09 ENCOUNTER — Encounter: Payer: Self-pay | Admitting: Family Medicine

## 2014-12-09 VITALS — BP 136/91 | HR 103 | Temp 98.1°F | Ht 73.0 in | Wt >= 6400 oz

## 2014-12-09 DIAGNOSIS — I10 Essential (primary) hypertension: Secondary | ICD-10-CM | POA: Diagnosis not present

## 2014-12-09 DIAGNOSIS — Z0000 Encounter for general adult medical examination without abnormal findings: Secondary | ICD-10-CM | POA: Insufficient documentation

## 2014-12-09 DIAGNOSIS — Z79899 Other long term (current) drug therapy: Secondary | ICD-10-CM | POA: Diagnosis not present

## 2014-12-09 DIAGNOSIS — F3160 Bipolar disorder, current episode mixed, unspecified: Secondary | ICD-10-CM

## 2014-12-09 DIAGNOSIS — L219 Seborrheic dermatitis, unspecified: Secondary | ICD-10-CM | POA: Diagnosis not present

## 2014-12-09 DIAGNOSIS — R5382 Chronic fatigue, unspecified: Secondary | ICD-10-CM

## 2014-12-09 LAB — CBC
HCT: 40.8 % (ref 36.0–46.0)
Hemoglobin: 13.4 g/dL (ref 12.0–15.0)
MCH: 27.7 pg (ref 26.0–34.0)
MCHC: 32.8 g/dL (ref 30.0–36.0)
MCV: 84.3 fL (ref 78.0–100.0)
MPV: 10.1 fL (ref 8.6–12.4)
Platelets: 349 10*3/uL (ref 150–400)
RBC: 4.84 MIL/uL (ref 3.87–5.11)
RDW: 13.8 % (ref 11.5–15.5)
WBC: 7.9 10*3/uL (ref 4.0–10.5)

## 2014-12-09 LAB — COMPLETE METABOLIC PANEL WITH GFR
ALT: 25 U/L (ref 0–35)
AST: 23 U/L (ref 0–37)
Albumin: 4.1 g/dL (ref 3.5–5.2)
Alkaline Phosphatase: 67 U/L (ref 39–117)
BILIRUBIN TOTAL: 0.7 mg/dL (ref 0.2–1.2)
BUN: 10 mg/dL (ref 6–23)
CO2: 28 meq/L (ref 19–32)
Calcium: 9.6 mg/dL (ref 8.4–10.5)
Chloride: 101 mEq/L (ref 96–112)
Creat: 0.85 mg/dL (ref 0.50–1.10)
GFR, EST NON AFRICAN AMERICAN: 88 mL/min
GLUCOSE: 118 mg/dL — AB (ref 70–99)
Potassium: 4.1 mEq/L (ref 3.5–5.3)
Sodium: 141 mEq/L (ref 135–145)
Total Protein: 7.5 g/dL (ref 6.0–8.3)

## 2014-12-09 LAB — LIPID PANEL
CHOLESTEROL: 177 mg/dL (ref 0–200)
HDL: 46 mg/dL (ref >=46)
LDL Cholesterol: 106 mg/dL — ABNORMAL HIGH (ref 0–99)
Total CHOL/HDL Ratio: 3.8 Ratio
Triglycerides: 123 mg/dL (ref <150)
VLDL: 25 mg/dL (ref 0–40)

## 2014-12-09 LAB — POCT GLYCOSYLATED HEMOGLOBIN (HGB A1C): Hemoglobin A1C: 5.8

## 2014-12-09 MED ORDER — FLUOCINOLONE ACETONIDE 0.01 % EX SHAM: 1.0000 "application " | MEDICATED_SHAMPOO | Freq: Every day | CUTANEOUS | Status: DC

## 2014-12-09 MED ORDER — LORATADINE 10 MG PO TABS: ORAL_TABLET | ORAL | Status: DC

## 2014-12-09 MED ORDER — BETAMETHASONE VALERATE 0.12 % EX FOAM: CUTANEOUS | Status: DC

## 2014-12-09 NOTE — Assessment & Plan Note (Signed)
Continues to be an issue for the patient. Will try moderate potency corticosteroid - betamethasone foam.

## 2014-12-09 NOTE — Patient Instructions (Signed)
Was nice to see you today.  I have refilled your medications.  I will send you a letter with your lab results.  Follow up at least annually.  Take care  Dr. Adriana Simasook

## 2014-12-09 NOTE — Progress Notes (Signed)
Subjective:   Karen Brennan is a 39 y.o. female presents for an annual physical exam.   Gyn concerns/Preventative healthcare  Last menstrual period: No LMP recorded. Patient has had an ablation.  Regular periods: No.   Heavy bleeding: No.   Sexually active: No.   Contraception or hormonal therapy: No.   Last mammogram: Screening mammogram not yet indicated.  Breast mass or concerns: No.   Last pap smear: 2015.  HTN  Well-controlled.  Medications - HCTZ 25 mg daily.  Compliance -  yes.  ROS: Denies chest pain, SOB, lightheadedness/dizziness   Bipolar disorder  Patient still seems to experience significant anxiety and depression.  She is followed closely by [facility].  PMH, Surgical Hx, Social History reviewed and updated as below.  Past Medical History  Diagnosis Date  . Hypertension   . ANEMIA 08/14/2008    Qualifier: Diagnosis of  By: Karn Pickler MD, Shanda Bumps    . Bipolar 1 disorder   . Kidney stones 10/09/11  . History of bronchitis   . Blood transfusion   . Migraines 10/09/11    "often"  . GERD (gastroesophageal reflux disease)   . Anxiety   . Depression    Past Surgical History  Procedure Laterality Date  . Endometrial ablation  ~ 2011  . Tubal ligation  2006  . Fracture surgery    . Ankle fracture surgery   1990's    "left; put screws in"  . Gastric by pass     History   Social History  . Marital Status: Single    Spouse Name: N/A  . Number of Children: N/A  . Years of Education: N/A   Social History Main Topics  . Smoking status: Former Smoker -- 0.25 packs/day for 4 years    Types: Cigarettes    Quit date: 06/12/2004  . Smokeless tobacco: Never Used  . Alcohol Use: No  . Drug Use: No  . Sexual Activity: Not on file     Comment: Novasure   Other Topics Concern  . None   Social History Narrative    Review of Systems:  Per HPI. Otherwise a complete 10 point ROS was negative.   Objective:   Filed Vitals:   12/09/14 1026  BP:  136/91  Pulse: 103  Temp: 98.1 F (36.7 C)   Exam: General: well appearing morbidly obese female in no acute distress. HEENT: NCAT. TMs bilaterally. Oropharynx clear. No scleral icterus. PERRLA. Neck: Supple. No adenopathy. No JVD. Trachea midline. No thyromegaly appreciated. Cardiovascular: RRR. No murmurs, rubs, or gallops. Respiratory: CTAB. No rales, rhonchi, or wheeze. Abdomen: soft, nontender, nondistended. No palpable organomegaly. No rebound or guarding. Extremities: warm, well perfused. No LE edema. Skin: Warm, dry, intact. Neuro: No focal deficits. Psych: Depressed mood and affect. Patient was pleasant during exam and did smile several times.     Chemistry      Component Value Date/Time   NA 140 05/20/2014 0033   K 3.4* 05/20/2014 0033   CL 98 05/20/2014 0033   CO2 29 05/20/2014 0033   BUN 7 05/20/2014 0033   CREATININE 0.78 05/20/2014 0033   CREATININE 0.82 12/05/2013 1207      Component Value Date/Time   CALCIUM 8.9 05/20/2014 0033   ALKPHOS 62 12/05/2013 1207   AST 19 12/05/2013 1207   ALT 18 12/05/2013 1207   BILITOT 0.6 12/05/2013 1207      Lab Results  Component Value Date   WBC 9.8 05/20/2014   HGB 13.0 05/20/2014  HCT 39.6 05/20/2014   MCV 88.8 05/20/2014   PLT 361 05/20/2014   Lab Results  Component Value Date   TSH 0.952 01/06/2013   Lab Results  Component Value Date   HGBA1C 5.8 12/09/2014   Assessment/Plan:  See Problem List

## 2014-12-09 NOTE — Telephone Encounter (Signed)
[facility]amethasone valerate foam. Received PA approval via [place] [employer].  Med approved for 12/09/14 - 01/08/15.  [facility] informed.  PA approval number [id number]. Daphine Deutscher. Siraj Dermody, Bronson Ingamika L, RN

## 2014-12-09 NOTE — Assessment & Plan Note (Signed)
I advised her to follow-up closely with [facility].

## 2014-12-09 NOTE — Assessment & Plan Note (Addendum)
Up-to-date. Patient's Pap smear is due in 2013. She is not yet old enough for screening mammograms. Screening labs today: CBC, CMP, lipid panel, A1c.

## 2014-12-09 NOTE — Assessment & Plan Note (Signed)
Well-controlled. Continue HCTZ

## 2014-12-10 ENCOUNTER — Encounter: Payer: Self-pay | Admitting: Family Medicine

## 2014-12-23 ENCOUNTER — Other Ambulatory Visit: Payer: Self-pay | Admitting: *Deleted

## 2014-12-23 MED ORDER — HYDROCHLOROTHIAZIDE 25 MG PO TABS: 25.0000 mg | ORAL_TABLET | Freq: Every day | ORAL | Status: DC

## 2014-12-24 ENCOUNTER — Other Ambulatory Visit: Payer: Self-pay | Admitting: Family Medicine

## 2014-12-24 NOTE — Telephone Encounter (Signed)
Patient was last seen on 12/09/14 for a physical. She states that she never received her refill on hydrochlorothiazide that was supposed to have been sent on that date. Please contact the patient once it is ready. [facility] [address] Thanks, HoneywellSadie Brennan, ASA

## 2014-12-25 NOTE — Telephone Encounter (Signed)
Contacted Pharmacy. They did receive order for HCTZ and it is there for her to pick up.

## 2014-12-25 NOTE — Telephone Encounter (Signed)
Spoke to pt. Informed her that the Rx is ready at the pharmacy. Sunday SpillersSharon T Sameerah Nachtigal, CMA

## 2015-02-17 ENCOUNTER — Other Ambulatory Visit: Payer: Self-pay | Admitting: *Deleted

## 2015-02-17 MED ORDER — DERMA-SMOOTHE/FS SCALP 0.01 % EX OIL: TOPICAL_OIL | CUTANEOUS | Status: DC

## 2015-03-26 ENCOUNTER — Ambulatory Visit (INDEPENDENT_AMBULATORY_CARE_PROVIDER_SITE_OTHER): Payer: Medicaid Other | Admitting: Family Medicine

## 2015-03-26 VITALS — BP 137/96 | HR 88 | Temp 98.0°F | Ht 73.0 in | Wt >= 6400 oz

## 2015-03-26 DIAGNOSIS — L218 Other seborrheic dermatitis: Secondary | ICD-10-CM

## 2015-03-26 DIAGNOSIS — K219 Gastro-esophageal reflux disease without esophagitis: Secondary | ICD-10-CM | POA: Diagnosis not present

## 2015-03-26 DIAGNOSIS — B354 Tinea corporis: Secondary | ICD-10-CM

## 2015-03-26 DIAGNOSIS — L219 Seborrheic dermatitis, unspecified: Secondary | ICD-10-CM

## 2015-03-26 DIAGNOSIS — I10 Essential (primary) hypertension: Secondary | ICD-10-CM

## 2015-03-26 DIAGNOSIS — J309 Allergic rhinitis, unspecified: Secondary | ICD-10-CM

## 2015-03-26 LAB — POCT SKIN KOH: Skin KOH, POC: NEGATIVE

## 2015-03-26 MED ORDER — POLYETHYLENE GLYCOL 3350 17 G PO PACK: 17.0000 g | PACK | Freq: Every day | ORAL | Status: DC | PRN

## 2015-03-26 MED ORDER — OMEPRAZOLE 20 MG PO CPDR: 20.0000 mg | DELAYED_RELEASE_CAPSULE | Freq: Every day | ORAL | Status: DC

## 2015-03-26 MED ORDER — HYDROCHLOROTHIAZIDE 25 MG PO TABS: 25.0000 mg | ORAL_TABLET | Freq: Every day | ORAL | Status: DC

## 2015-03-26 MED ORDER — TERBINAFINE HCL 250 MG PO TABS: 250.0000 mg | ORAL_TABLET | Freq: Every day | ORAL | Status: DC

## 2015-03-26 MED ORDER — DERMA-SMOOTHE/FS SCALP 0.01 % EX OIL: TOPICAL_OIL | CUTANEOUS | Status: DC

## 2015-03-26 MED ORDER — LORATADINE 10 MG PO TABS: ORAL_TABLET | ORAL | Status: DC

## 2015-03-26 NOTE — Patient Instructions (Signed)
Thank you so much for coming to visit today! I believe your rash is due to a fungal infection. I will give you a prescription for a medicine (Terbinafine) which you will take once a day for 6 weeks. You may also use an over the counter fungal cream if you wish. Please follow up if your rash fails to improve.  Thanks again! Dr. Caroleen Hammanumley  Scalp Ringworm Scalp ringworm (tinea capitis) is a fungal infection of the skin on the scalp. This condition is easily spread from person to person (contagious). It can also be spread from animals to humans. HOME CARE  Give or apply over-the-counter and prescription medicines only as told by your doctor. This may include giving medicine for up to 6-8 weeks to kill the fungus.  Check your household members and your pets, if this applies, for ringworm. Do this often to make sure they do not get the condition.  Do not share:  Brushes.  Combs.  Barrettes.  Hats.  Towels.   Clean and disinfect all combs, brushes, and hats that you wear or use. Throw away any natural bristle brushes. GET HELP IF:  Your rash gets worse.  Your rash spreads.  Your rash comes back after treatment is done.  Your rash does not get better with treatment.  You have a fever.  Your rash is painful and medicine does not help the pain.  Your rash becomes red, warm, tender, and swollen. GET HELP RIGHT AWAY IF:  You have yellowish-white fluid (pus) coming from the rash.  You develop a fever   This information is not intended to replace advice given to you by your health care provider. Make sure you discuss any questions you have with your health care provider.   Document Released: 05/17/2009 Document Revised: 02/17/2015 Document Reviewed: 11/04/2014 Elsevier Interactive Patient Education [employer]2016 Elsevier Inc.

## 2015-03-28 DIAGNOSIS — B354 Tinea corporis: Secondary | ICD-10-CM | POA: Insufficient documentation

## 2015-03-28 NOTE — Assessment & Plan Note (Signed)
-   Stable. Refill of Prilosec given.

## 2015-03-28 NOTE — Progress Notes (Signed)
Subjective:     Patient ID: Karen Brennan, female   DOB: 11/01/1975, 39 y.o.   MRN: [id number]  HPI Karen Brennan is a 39yo female presenting today for medication refill and rash on face and scalp. # Medication Refill: - Requests refills of HCTZ, Claritin, Omeprazole, Miralax - States blood pressure is normally well controlled.  Claritin works well for allergies. Miralax and Prilosec work well for constipation and GERD respectively.   # Rash: - Located and face and scalp - Feels like it is worsening and spreading since last visit. - Was seen in clinic on 6/29 and diagnosed with seborrheic dermatitis. Prescription given for Betamethasone foam, which she was unable to pick up since Medicaid will not cover it. - Rash is itchy - Has been present [id number] - Denies any contacts with similar rash - Denies fevers  - Smoking status discussed  Review of Systems Per HPI    Objective:   Physical Exam  Constitutional: She appears well-developed and well-nourished. No distress.  Cardiovascular: Normal rate and regular rhythm.  Exam reveals no gallop and no friction rub.   No murmur heard. Pulmonary/Chest: Effort normal. No respiratory distress. She has no wheezes. She has no rales.  Skin:  Circular scaly appearance to rash on scalp and face with slightly erythematous ring        Assessment and Plan:     Tinea corporis - KOH negative, but strong clinical suspicion for tinea on both scalp and face - Prescription for Terbinafine given - Note recent CMP on 12/09/14 normal - If ineffective, consider other etiologies for rash  HYPERTENSION, BENIGN ESSENTIAL - Stable. Refill for HCTZ given.  Allergic rhinitis - Stable. Prescription for Claritin given.   Esophageal reflux - Stable. Refill of Prilosec given.

## 2015-03-28 NOTE — Assessment & Plan Note (Signed)
-   KOH negative, but strong clinical suspicion for tinea on both scalp and face - Prescription for Terbinafine given - Note recent CMP on 12/09/14 normal - If ineffective, consider other etiologies for rash

## 2015-03-28 NOTE — Assessment & Plan Note (Signed)
-   Stable. Prescription for Claritin given.

## 2015-03-28 NOTE — Assessment & Plan Note (Signed)
-   Stable. Refill for HCTZ given.

## 2015-04-02 ENCOUNTER — Telehealth: Payer: Self-pay | Admitting: Family Medicine

## 2015-04-02 NOTE — Telephone Encounter (Signed)
Pt called because she needs the needles that go in the glass bottle called in. jw

## 2015-04-06 NOTE — Telephone Encounter (Signed)
Please clarify what needles are needed for. Med rec doesn't show any medications ordered that would require needles.

## 2015-04-12 NOTE — Telephone Encounter (Signed)
This is about her mother Oneal Deputylsie Sargent, not her.  Have clarified issued, nothing for MD to do. [place], Maryjo RochesterJessica Dawn

## 2015-05-18 ENCOUNTER — Other Ambulatory Visit (HOSPITAL_COMMUNITY)
Admission: RE | Admit: 2015-05-18 | Discharge: 2015-05-18 | Disposition: A | Discharge status: Home / Self Care | Payer: Medicaid Other | Source: Ambulatory Visit | Attending: Family Medicine | Admitting: Family Medicine

## 2015-05-18 ENCOUNTER — Encounter: Payer: Self-pay | Admitting: Family Medicine

## 2015-05-18 ENCOUNTER — Ambulatory Visit (INDEPENDENT_AMBULATORY_CARE_PROVIDER_SITE_OTHER): Payer: Medicaid Other | Admitting: Family Medicine

## 2015-05-18 VITALS — BP 109/83 | HR 106 | Temp 98.0°F | Ht 73.0 in | Wt >= 6400 oz

## 2015-05-18 DIAGNOSIS — Z01411 Encounter for gynecological examination (general) (routine) with abnormal findings: Secondary | ICD-10-CM | POA: Insufficient documentation

## 2015-05-18 DIAGNOSIS — Z1151 Encounter for screening for human papillomavirus (HPV): Secondary | ICD-10-CM | POA: Diagnosis present

## 2015-05-18 DIAGNOSIS — Z0000 Encounter for general adult medical examination without abnormal findings: Secondary | ICD-10-CM | POA: Diagnosis not present

## 2015-05-18 DIAGNOSIS — I10 Essential (primary) hypertension: Secondary | ICD-10-CM

## 2015-05-18 DIAGNOSIS — Z124 Encounter for screening for malignant neoplasm of cervix: Secondary | ICD-10-CM | POA: Diagnosis not present

## 2015-05-18 DIAGNOSIS — R21 Rash and other nonspecific skin eruption: Secondary | ICD-10-CM | POA: Diagnosis not present

## 2015-05-18 NOTE — Assessment & Plan Note (Signed)
-   BP well controlled at 109/83 - Discussed that if she misses a dose of HCTZ, she should not take all of the doses she missed at the same time to catch up. - Continue to monitor at home, using BP cuffs at drug stores and [facility] when possible. Notify office if blood pressure is greater than 150/90.

## 2015-05-18 NOTE — Patient Instructions (Signed)
Thank you so much for coming to visit me today!  I will place referral to dermatology. Please monitor your blood pressure at home and let us know if it is consistently above 150/90. I will contact you with the results of your pap smear.  Thanks again! Dr. Caroleen Hammanumley

## 2015-05-18 NOTE — Assessment & Plan Note (Signed)
-   Given long history of rash without improvement and multiple medications attempted without relief, referral to dermatology placed.

## 2015-05-18 NOTE — Assessment & Plan Note (Addendum)
-   Repeat pap smear today given ASCCP guidelines for HPV positive, cytology negative in 11/2013. - If HPV positive, will refer to colposcopy. If negative, will repeat pap smear in three years. - No statin indicated

## 2015-05-18 NOTE — Progress Notes (Signed)
Subjective:     Patient ID: Karen Brennan, female   DOB: 04/01/1976, 39 y.o.   MRN: [id number]  HPI Karen Brennan is a 39yo female presenting today for follow up of rash and hypertension.  # Rash: - Present for greater than one year and appears to be worsening - Located on face and scalp. Denies rash elsewhere - Pruritic - Has tried Betamethasone foam, tar shampoo, terbinafine - States she took terbinafine for approximately one month, but discontinued it since she didn't note any change - Denies any contacts with similar rash  # Hypertension: - Currently prescribed HCTZ 25mg  - Reports she went to [facility] yesterday and blood ressure of 154/100 noted. Realized she had forgotten her blood pressure medicine, so she went home and took two tablets.  - Denies chest pain, shortness of breath, headache  # Depression/Anxiety/Bipolar: - Currently prescribed Buspar 15mg  TID, Celexa 40mg  daily. [facility]ations [facility]. - Denies suicidal and homicidal ideation  # Health Maintenance: - Last pap smear 11/2013 without signs of malignancy, however high risk HPV present - Last BMP 11/2014 normal except for elevated glucose of 118.  - Last lipid panel 11/2014 - Last CBC 11/2014 - A1C 5.8 in 11/2014  - Problem list and medications reviewed and updated with Karen Brennan - Denies smoking  Review of Systems Per HPI    Objective:   Physical Exam  Constitutional: She appears well-developed and well-nourished. No distress.  HENT:  Head: Normocephalic and atraumatic.  Cardiovascular: Normal rate and regular rhythm.  Exam reveals no gallop and no friction rub.   No murmur heard. Pulmonary/Chest: Effort normal. No respiratory distress. She has no wheezes. She has no rales.  Genitourinary: Vagina normal. No vaginal discharge found.  Musculoskeletal: She exhibits no edema.  Neurological: She is alert.  Skin:  Hypopigmented and hyperpigmented lesions noted diffusely over face with scaling and dryness.  Circular lesions noted in scalp improved from last visit.  Psychiatric: She has a normal mood and affect. Her behavior is normal.  Denies suicidal or homicidal ideation      Assessment:     HYPERTENSION, BENIGN ESSENTIAL - BP well controlled at 109/83 - Discussed that if she misses a dose of HCTZ, she should not take all of the doses she missed at the same time to catch up. - Continue to monitor at home, using BP cuffs at drug stores and [employer] when possible. Notify office if blood pressure is greater than 150/90.  Rash and nonspecific skin eruption - Given long history of rash without improvement and multiple medications attempted without relief, referral to dermatology placed.  Preventative health care - Repeat pap smear today given ASCCP guidelines for HPV positive, cytology negative in 11/2013. - If HPV positive, will refer to colposcopy. If negative, will repeat pap smear in three years.

## 2015-05-19 LAB — CYTOLOGY - PAP

## 2015-05-21 ENCOUNTER — Telehealth: Payer: Self-pay | Admitting: Family Medicine

## 2015-05-21 NOTE — Telephone Encounter (Signed)
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 Instructed to follow up at gyn clinic for colposcopy.

## 2015-05-27 ENCOUNTER — Ambulatory Visit (INDEPENDENT_AMBULATORY_CARE_PROVIDER_SITE_OTHER): Payer: Medicaid Other | Admitting: Family Medicine

## 2015-05-27 VITALS — BP 144/97 | HR 110 | Temp 98.1°F | Ht 73.0 in | Wt >= 6400 oz

## 2015-05-27 DIAGNOSIS — R896 Abnormal cytological findings in specimens from other organs, systems and tissues: Secondary | ICD-10-CM

## 2015-05-27 DIAGNOSIS — IMO0002 Reserved for concepts with insufficient information to code with codable children: Secondary | ICD-10-CM | POA: Insufficient documentation

## 2015-05-27 NOTE — Assessment & Plan Note (Signed)
Given her pap smear cytology / pathology however I think increased surveillance as necessary. I will have her come back for Pap smear in 6 months either with her PCP or with me. If any abnormality, repeat colposcopy. I have discussed this treatment pathway with the patient. Pap du in May 2017.

## 2015-05-27 NOTE — Progress Notes (Signed)
Patient ID: Karen Brennan, female   DOB: 10/12/1975, 39 y.o.   MRN: [id number] Patient is here for colposcopy.  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  Pap 11/2013 Normal;  08/2010: ASCUS+ Hi risk HPV 07/2009; 11/2007 and  3008 and 3006 all normal. patient asymptomatic. Long history of irregular menses. Not currently sexually active with men.  Patient given informed consent, signed copy in the chart.  Placed in lithotomy position. Cervix viewed with speculum and colposcope after application of acetic acid.   Colposcopy adequate (entire squamocolumnar junctions seen  in entirety) ?  Yes. Large speculum but exam was adequate without the use of vaginal sidewall retractors. Acetowhite lesions?None Punctation?None Mosaicism?  None Abnormal vasculature?  None Lugol solution was used as an additional tests and there was no evidence of unusual uptake of the iodine-based solution. Biopsies?None ECC?No Complications? None  COMMENTS: Patient was given post procedure instructions. No restrictions. She may have some mild spotting.  Assessment. 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  Clinically her colposcopy was totally normal. Given the pathology which included the fact that they saw some cells potentially of a higher grade lesion, I want to increase her surveillance. I did not think she would benefit from a blind biopsy and I did not think she would benefit from endocervical curettage as there was absolutely no lesion visible. I think the false positive potential of either of those procedures outweighed any potential benefit. Given her pap smear cytology / pathology however I think increased surveillance as necessary. I will have her come back for Pap smear in 6 months  either with her PCP or with me. We have discussed this in detail. I answered all of her questions.

## 2015-09-21 ENCOUNTER — Ambulatory Visit (INDEPENDENT_AMBULATORY_CARE_PROVIDER_SITE_OTHER): Payer: Medicaid Other | Admitting: Family Medicine

## 2015-09-21 ENCOUNTER — Encounter: Payer: Self-pay | Admitting: Family Medicine

## 2015-09-21 VITALS — BP 136/87 | HR 100 | Temp 98.7°F | Ht 75.0 in | Wt >= 6400 oz

## 2015-09-21 DIAGNOSIS — J309 Allergic rhinitis, unspecified: Secondary | ICD-10-CM | POA: Diagnosis not present

## 2015-09-21 DIAGNOSIS — Z20828 Contact with and (suspected) exposure to other viral communicable diseases: Secondary | ICD-10-CM

## 2015-09-21 DIAGNOSIS — R6889 Other general symptoms and signs: Secondary | ICD-10-CM | POA: Diagnosis not present

## 2015-09-21 DIAGNOSIS — J029 Acute pharyngitis, unspecified: Secondary | ICD-10-CM

## 2015-09-21 MED ORDER — IBUPROFEN 800 MG PO TABS: 800.0000 mg | ORAL_TABLET | Freq: Three times a day (TID) | ORAL | Status: DC | PRN

## 2015-09-21 MED ORDER — OSELTAMIVIR PHOSPHATE 75 MG PO CAPS: 75.0000 mg | ORAL_CAPSULE | Freq: Two times a day (BID) | ORAL | Status: DC

## 2015-09-21 NOTE — Assessment & Plan Note (Signed)
Likely contributing to current allergies / sore throat. Likely needs to start allergy meds earlier in March. Continue Loratadine Continue Flonase, do not see rx, patient has at home.

## 2015-09-21 NOTE — Progress Notes (Signed)
Subjective:    Patient ID: Karen Brennan, female    DOB: Apr 25, 1976, 40 y.o.   MRN: [id number]  Karen Brennan is a 40 y.o. female presenting on 09/21/2015 for Sore Throat and Fever   Patient presents for a same day appointment.    HPI  FLU-LIKE SYMPTOMS / FEVER / SORE THROAT: - Reports symptoms started 3 days ago with fevers (up to 103F oral temp Tmax, 3 days ago since improve), associated with chills and generalized myalgias, sore throat - Concern that sick contact with flu (her's lasted 24-48 hours, but never tested for influenza) - Did not receive flu shot this season - Tried for sore throat Tylenol  every 4 hours / Ibuprofen  every 6 hours alternating - Reduced appetite with sore throat - Admits nasal congestion and clear rhinorrhea, generalized headache worsening - Denies any cough, nausea, vomiting, diarrhea, abdominal pain  Social History  Substance Use Topics  . Smoking status: Former Smoker -- 0.25 packs/day for 4 years    Types: Cigarettes    Quit date: 06/12/2004  . Smokeless tobacco: Never Used  . Alcohol Use: No    Review of Systems Per HPI unless specifically indicated above     Objective:    BP 136/87 mmHg  Pulse 100  Temp(Src) 98.7 F (37.1 C) (Oral)  Ht  (1.905 m)  Wt 466 lb 1.6 oz (211.422 kg)  BMI 58.26 kg/m2  Wt Readings from Last 3 Encounters:  09/21/15 466 lb 1.6 oz (211.422 kg)  05/27/15 467 lb (211.83 kg)  05/18/15 464 lb 3.2 oz (210.56 kg)    Physical Exam  Constitutional: She appears well-developed and well-nourished. No distress.  Morbidly obese, tired but currently well appearing, cooperative, mostly comfortable some mild discomfort with sore throat  HENT:  Head: Normocephalic and atraumatic.  Mouth/Throat: Oropharynx is clear and moist.  Frontal and maxillary sinuses non-tender to palpation, nares bilateral edematous without congestion or purulence, TM's bilaterally with mild fullness and serous effusion without  erythema. Oropharynx clear with mild generalized erythema of posterior pharynx without exudate, focal edema, no asymmetry  Eyes: Conjunctivae are normal. Pupils are equal, round, and reactive to light. Right eye exhibits no discharge. Left eye exhibits no discharge.  Neck: Normal range of motion. Neck supple. No thyromegaly present.  Cardiovascular: Regular rhythm, normal heart sounds and intact distal pulses.   No murmur heard. Mild tachycardic  Pulmonary/Chest: Effort normal and breath sounds normal. No respiratory distress. She has no wheezes. She has no rales.  Musculoskeletal: Normal range of motion. She exhibits no tenderness.  Lymphadenopathy:    She has cervical adenopathy (bilateral anterior mild tender).  Neurological: She is alert.  Skin: Skin is warm and dry. No rash noted. She is not diaphoretic.  Nursing note and vitals reviewed.  Results for orders placed or performed in visit on 05/18/15  Cytology - PAP [facility]  Result Value Ref Range   CYTOLOGY - PAP PAP RESULT       Assessment & Plan:   Problem List Items Addressed This Visit    Allergic rhinitis    Likely contributing to current allergies / sore throat. Likely needs to start allergy meds earlier in March. Continue Loratadine Continue Flonase, do not see rx, patient has at home.       Other Visit Diagnoses    Flu-like symptoms    -  Primary    Relevant Medications    oseltamivir (TAMIFLU) 75 MG capsule    ibuprofen (  ADVIL,MOTRIN) 800 MG tablet    Exposure to influenza        Relevant Medications    oseltamivir (TAMIFLU) 75 MG capsule    Acute pharyngitis, unspecified etiology           Presumed influenza with suspected exposure and consistent symptoms. Additionally may have had viral pharyngitis vs allergies causing sore throat, flu-like symptoms gradually improving, within 72 hours onset. Afebrile, currently well-appearing and non-toxic, well hydrated on exam, no focal signs of infection (ears,  sinuses, throat, lungs clear).  Plan: 1. Tamiflu treatment BID x 5 days - mutual decision making, counseled on risks/benefits, side effects, may only reduce symptoms, but patient would like to try 2. rx Ibuprofen 800 q 8 hr PRN sore throat 3. Supportive care per AVS 4. Return criteria given    Meds ordered this encounter  Medications  . oseltamivir (TAMIFLU) 75 MG capsule    Sig: Take 1 capsule (75 mg total) by mouth 2 (two) times daily. For 5 days    Dispense:  10 capsule    Refill:  0  . ibuprofen (ADVIL,MOTRIN) 800 MG tablet    Sig: Take 1 tablet (800 mg total) by mouth every 8 (eight) hours as needed for fever, headache or moderate pain (sore throat).    Dispense:  30 tablet    Refill:  0      Follow up plan: Return in about 1 week (around 09/28/2015) for flu-like illness.  Saralyn PilarAlexander Karamalegos, DO [facility], PGY-3

## 2015-09-21 NOTE — Patient Instructions (Signed)
Thank you for coming in to clinic today.  1. It sounds like you may be getting over the Flu - also could be related to Viral Pharyngitis (Sore Throat) with Allergies - this will most likely run it's course in 5 to 10 days. Wash hands good to prevent spread of virus. - I do not see any evidence of Strep Throat today. No ear infection. Lungs clear no Pneumonia. No evidence of sinus infection either. - Start Tamiflu for reducing flu symptoms, 1 pill twice daily for 5 days, may cause upset stomach - May continue Tylenol and Ibuprofen each dose every 6 hours for next 3 to 5 days, no more than 1 week, this may be causing your headaches unfortunately - Drink extra clear fluids (water, or G2 gatorade), try colder soft foods if needed otherwise regular diet - Drink warm herbal tea with honey for sore throat  - You can try over the counter Nasal Saline spray (Simply Saline, Ocean Spray) as needed to reduce congestion.  If your fever returns with other symptoms, worsening sinus pressure, earache, productive cough, nausea, vomiting, diarrhea, or not resolving after 7 to 10 days. You can return for re-evaluation.  Please schedule a follow-up appointment with Dr Caroleen Hammanumley in 7 to 10 days for Flu-like illness if not improved  If you have any other questions or concerns, please feel free to call the clinic to contact me. You may also schedule an earlier appointment if necessary.  However, if your symptoms get significantly worse, please go to the Emergency Department to seek immediate medical attention.  Saralyn PilarAlexander Bienvenido Proehl, DO [facility]

## 2015-09-22 ENCOUNTER — Encounter (HOSPITAL_COMMUNITY): Payer: Self-pay | Admitting: Emergency Medicine

## 2015-09-22 ENCOUNTER — Ambulatory Visit (HOSPITAL_COMMUNITY)
Admission: EM | Admit: 2015-09-22 | Discharge: 2015-09-22 | Disposition: A | Discharge status: Home / Self Care | Payer: Medicaid Other | Attending: Family Medicine | Admitting: Family Medicine

## 2015-09-22 DIAGNOSIS — J029 Acute pharyngitis, unspecified: Secondary | ICD-10-CM

## 2015-09-22 DIAGNOSIS — Z2089 Contact with and (suspected) exposure to other communicable diseases: Secondary | ICD-10-CM | POA: Diagnosis not present

## 2015-09-22 DIAGNOSIS — Z20818 Contact with and (suspected) exposure to other bacterial communicable diseases: Secondary | ICD-10-CM

## 2015-09-22 LAB — POCT RAPID STREP A: Streptococcus, Group A Screen (Direct): NEGATIVE

## 2015-09-22 MED ORDER — AMOXICILLIN 500 MG PO CAPS: 500.0000 mg | ORAL_CAPSULE | Freq: Three times a day (TID) | ORAL | Status: DC

## 2015-09-22 MED ORDER — PREDNISONE 50 MG PO TABS: 50.0000 mg | ORAL_TABLET | Freq: Every day | ORAL | Status: DC

## 2015-09-22 NOTE — ED Notes (Signed)
The patient presented to the [facility] with a complaint of a sore throat x 5 days.  

## 2015-09-22 NOTE — Discharge Instructions (Signed)

## 2015-09-23 ENCOUNTER — Ambulatory Visit: Payer: Self-pay | Admitting: Family Medicine

## 2015-09-23 NOTE — ED Provider Notes (Signed)
CSN: [id number]     Arrival date & time 09/22/15  1804 History   First MD Initiated Contact with Patient 09/22/15 1944     Chief Complaint  Patient presents with  . Sore Throat   (Consider location/radiation/quality/duration/timing/severity/associated sxs/prior Treatment) HPI Pt states she was seen by her PCP on Tuesday, dx with flu. Throat is more sore today. Also low grade temp. Understands no tx for flu, but throat is keeping her from eating or taking meds.  Past Medical History  Diagnosis Date  . Hypertension   . ANEMIA 08/14/2008    Qualifier: Diagnosis of  By: Karn Pickler MD, Shanda Bumps    . Bipolar 1 disorder (HCC)   . Kidney stones 10/09/11  . History of bronchitis   . Blood transfusion   . Migraines 10/09/11    "often"  . GERD (gastroesophageal reflux disease)   . Anxiety   . Depression    Past Surgical History  Procedure Laterality Date  . Endometrial ablation  ~ 2011  . Tubal ligation  2006  . Fracture surgery    . Ankle fracture surgery   1990's    "left; put screws in"  . Gastric by pass     History reviewed. No pertinent family history. Social History  Substance Use Topics  . Smoking status: Former Smoker -- 0.25 packs/day for 4 years    Types: Cigarettes    Quit date: 06/12/2004  . Smokeless tobacco: Never Used  . Alcohol Use: No   OB History    No data available     Review of Systems Sore throat flu symptoms Allergies  Sibutramine hcl monohydrate  Home Medications   Prior to Admission medications   Medication Sig Start Date End Date Taking? Authorizing Provider  busPIRone (BUSPAR) 15 MG tablet Take 15 mg by mouth 3 (three) times daily. 04/09/14  Yes Historical Provider, MD  citalopram (CELEXA) 40 MG tablet Take 40 mg by mouth daily.   Yes Historical Provider, MD  hydrochlorothiazide (HYDRODIURIL) 25 MG tablet Take 1 tablet (25 mg total) by mouth daily. 03/26/15  Yes [place] N Rumley, DO  hydrOXYzine (ATARAX/VISTARIL) 25 MG tablet Take 50 mg by mouth 2  (two) times daily.   Yes Historical Provider, MD  oseltamivir (TAMIFLU) 75 MG capsule Take 1 capsule (75 mg total) by mouth 2 (two) times daily. For 5 days 09/21/15  Yes Smitty Cords, DO  amoxicillin (AMOXIL) 500 MG capsule Take 1 capsule (500 mg total) by mouth 3 (three) times daily. 09/22/15   Tharon Aquas, PA  amoxicillin (AMOXIL) 500 MG capsule Take 1 capsule (500 mg total) by mouth 3 (three) times daily. 09/22/15   Tharon Aquas, PA  CALCIUM PO Take 1 tablet by mouth 3 (three) times daily.    Historical Provider, MD  ferrous sulfate 325 (65 FE) MG tablet Take 325 mg by mouth daily.    Historical Provider, MD  Fluocinolone Acetonide (DERMA-SMOOTHE/FS SCALP) 0.01 % OIL APPLY NO MORE THAN 1 OZ TO SCALP ONCE DAILY, LEAVE ON SCALP FOR 5 MINS, THEN RINSE 03/26/15   [place] N Rumley, DO  ibuprofen (ADVIL,MOTRIN) 800 MG tablet Take 1 tablet (800 mg total) by mouth every 8 (eight) hours as needed for fever, headache or moderate pain (sore throat). 09/21/15   Smitty Cords, DO  loratadine (CLARITIN) 10 MG tablet TAKE 1 TABLET (10 MG TOTAL) BY MOUTH DAILY. 03/26/15   [place] N Rumley, DO  Multiple Vitamin (MULTIVITAMIN WITH MINERALS) TABS Take 1 tablet by  mouth 3 (three) times daily.    Historical Provider, MD  OLANZapine (ZYPREXA) 20 MG tablet Take 20 mg by mouth at bedtime.    Historical Provider, MD  omeprazole (PRILOSEC) 20 MG capsule Take 1 capsule (20 mg total) by mouth daily. 03/26/15   [place] N Rumley, DO  polyethylene glycol (MIRALAX / GLYCOLAX) packet Take 17 g by mouth daily as needed for mild constipation. 03/26/15   [place] N Rumley, DO  predniSONE (DELTASONE) 50 MG tablet Take 1 tablet (50 mg total) by mouth daily. 09/22/15   Tharon AquasFrank C Patrick, PA  terbinafine (LAMISIL) 250 MG tablet Take 1 tablet (250 mg total) by mouth daily. 03/26/15   Damar Bonnell PublicN Rumley, DO   Meds Ordered and Administered this Visit  Medications - No data to display  BP 140/92 mmHg  Pulse 106   Temp(Src) 99.6 F (37.6 C) (Oral)  Resp 16  SpO2 100% No data found.   Physical Exam NURSES NOTES AND VITAL SIGNS REVIEWED. CONSTITUTIONAL: Well developed, well nourished, no acute distress HEENT: normocephalic, atraumatic, Throat is injected without exudate, nodes are a bit tender,  EYES: Conjunctiva normal NECK:normal ROM, supple, no adenopathy PULMONARY:No respiratory distress, normal effort, Lungs are clear MUSCULOSKELETAL: Normal ROM of all extremities,  SKIN: warm and dry without rash PSYCHIATRIC: Mood and affect, behavior are normal  ED Course  Procedures (including critical care time)  Labs Review Labs Reviewed  POCT RAPID STREP A    Imaging Review No results found.   Visual Acuity Review  Right Eye Distance:   Left Eye Distance:   Bilateral Distance:    Right Eye Near:   Left Eye Near:    Bilateral Near:     RBS is negative RX amoxil, prednisone  Discussion with patient that her sore throat being strep neg could be from the influ virus, but since close prox with daughter who is pos strep will treat as strep exposure.   MDM   1. Strep throat exposure   2. Pharyngitis     Patient is reassured that there are no issues that require transfer to higher level of care at this time or additional tests. Patient is advised to continue home symptomatic treatment. Patient is advised that if there are new or worsening symptoms to attend the emergency department, contact primary care provider, or return to UC. Instructions of care provided discharged home in stable condition.    THIS NOTE WAS GENERATED USING A VOICE RECOGNITION SOFTWARE PROGRAM. ALL REASONABLE EFFORTS  WERE MADE TO PROOFREAD THIS DOCUMENT FOR ACCURACY.  I have verbally reviewed the discharge instructions with the patient. A printed AVS was given to the patient.  All questions were answered prior to discharge.     Tharon AquasFrank C Patrick, PA 09/23/15 1002

## 2015-09-24 LAB — CULTURE, GROUP A STREP (THRC)

## 2016-03-06 ENCOUNTER — Ambulatory Visit (INDEPENDENT_AMBULATORY_CARE_PROVIDER_SITE_OTHER): Payer: Medicaid Other | Admitting: *Deleted

## 2016-03-06 DIAGNOSIS — Z23 Encounter for immunization: Secondary | ICD-10-CM | POA: Diagnosis not present

## 2016-05-09 ENCOUNTER — Encounter: Payer: Self-pay | Admitting: Internal Medicine

## 2016-05-09 ENCOUNTER — Ambulatory Visit (INDEPENDENT_AMBULATORY_CARE_PROVIDER_SITE_OTHER): Payer: Medicaid Other | Admitting: Internal Medicine

## 2016-05-09 DIAGNOSIS — G44209 Tension-type headache, unspecified, not intractable: Secondary | ICD-10-CM | POA: Diagnosis not present

## 2016-05-09 DIAGNOSIS — R519 Headache, unspecified: Secondary | ICD-10-CM | POA: Insufficient documentation

## 2016-05-09 DIAGNOSIS — G629 Polyneuropathy, unspecified: Secondary | ICD-10-CM

## 2016-05-09 DIAGNOSIS — R51 Headache: Secondary | ICD-10-CM

## 2016-05-09 DIAGNOSIS — G5793 Unspecified mononeuropathy of bilateral lower limbs: Secondary | ICD-10-CM | POA: Insufficient documentation

## 2016-05-09 LAB — COMPLETE METABOLIC PANEL WITH GFR
ALK PHOS: 61 U/L (ref 33–115)
ALT: 22 U/L (ref 6–29)
AST: 25 U/L (ref 10–30)
Albumin: 4.1 g/dL (ref 3.6–5.1)
BILIRUBIN TOTAL: 0.6 mg/dL (ref 0.2–1.2)
BUN: 11 mg/dL (ref 7–25)
CALCIUM: 9.5 mg/dL (ref 8.6–10.2)
CO2: 26 mmol/L (ref 20–31)
CREATININE: 0.87 mg/dL (ref 0.50–1.10)
Chloride: 100 mmol/L (ref 98–110)
GFR, EST NON AFRICAN AMERICAN: 85 mL/min (ref >=60)
Glucose, Bld: 112 mg/dL — ABNORMAL HIGH (ref 65–99)
Potassium: 4.2 mmol/L (ref 3.5–5.3)
Sodium: 138 mmol/L (ref 135–146)
TOTAL PROTEIN: 7.6 g/dL (ref 6.1–8.1)

## 2016-05-09 LAB — TSH: TSH: 1.15 m[IU]/L

## 2016-05-09 LAB — CBC
HEMATOCRIT: 42.1 % (ref 35.0–45.0)
Hemoglobin: 13.9 g/dL (ref 11.7–15.5)
MCH: 27.6 pg (ref 27.0–33.0)
MCHC: 33 g/dL (ref 32.0–36.0)
MCV: 83.7 fL (ref 80.0–100.0)
MPV: 9.6 fL (ref 7.5–12.5)
Platelets: 402 10*3/uL — ABNORMAL HIGH (ref 140–400)
RBC: 5.03 MIL/uL (ref 3.80–5.10)
RDW: 13.8 % (ref 11.0–15.0)
WBC: 9.1 10*3/uL (ref 3.8–10.8)

## 2016-05-09 LAB — POCT GLYCOSYLATED HEMOGLOBIN (HGB A1C): Hemoglobin A1C: 6.5

## 2016-05-09 MED ORDER — KETOROLAC TROMETHAMINE 30 MG/ML IJ SOLN
30.0000 mg | Freq: Once | INTRAMUSCULAR | Status: AC
  Administered 2016-05-09: 30 mg via INTRAMUSCULAR

## 2016-05-09 MED ORDER — IBUPROFEN 800 MG PO TABS: 800.0000 mg | ORAL_TABLET | Freq: Three times a day (TID) | ORAL | 0 refills | Status: DC | PRN

## 2016-05-09 MED ORDER — GABAPENTIN 100 MG PO CAPS: 100.0000 mg | ORAL_CAPSULE | Freq: Three times a day (TID) | ORAL | 3 refills | Status: DC

## 2016-05-09 MED ORDER — LORATADINE 10 MG PO TABS: ORAL_TABLET | ORAL | 3 refills | Status: DC

## 2016-05-09 NOTE — Assessment & Plan Note (Signed)
Likely tension headache, has a hx of headaches. Feels that her allergies maybe exacerbating the headache as has not been taking allergy medication and congested. Neurologic exam with normal limits. Slight elevated heart rate/blood pressure likely due to pain.  - ketorolac (TORADOL) 30 MG/ML injection 30 mg; Inject 1 mL (30 mg total) into the muscle once. - ibuprofen (ADVIL,MOTRIN) 800 MG tablet; Take 1 tablet (800 mg total) by mouth every 8 (eight) hours as needed.  Dispense: 30 tablet; Refill:0  - Follow up as need if headaches continue to worsen  - Consider referral to headache clinic vs.try sumatriptan etc.

## 2016-05-09 NOTE — Progress Notes (Addendum)
[facility] Kerrin Mo, MD Phone: [phone]  Reason For Visit: SDA Burning in feet, headache   Karen Brennan is a 40 y.o. female who complains of headaches for about 1week. Patient with a hx of chronic tension headaches. Currently has a frontal headache,  dull pain. Duration of individual headaches: several hours, daily headache for the past couple of days. Has been alternating tylenol and ibuprofen, this does resolve the headache. Associated symptoms: congestion. No fevers, no vomiting, no neurologic findings. Does endorse some nausea with headache at times. Precipitating factor, feels it is associated with congestion. Denies a history of recent head injury. Prior neurological history: negative for no neurological problems. Neurologic Review of Systems - no TIA or stroke-like symptoms.  Burning sensation in feet:  Bilateral numbness and burning sensation across the bottoms of the feet. Denies having any neurpathic symptoms like this before. No radiation or movement of numbness.  No hx of diabetes or thyriod problems   No polyuria, No polydispia. No new medications. Has not been taking her psychiatric medications in about 1 week, following up with [facility] to restart these, no symptoms currently. No lower back pain, no radiation of neuropathic pain. No ETOH use. No hx of bariatric surgery   Past Medical History Reviewed problem list.  Medications- reviewed and updated No additions to family history Social history- patient is a  non smoker  Objective: BP (!) 149/90   Pulse (!) 107   Temp 97.9 F (36.6 C) (Oral)   Wt (!) 478 lb (216.8 kg)   BMI 59.75 kg/m  Gen: NAD, alert, cooperative with exam HEENT: Normal    Neck: No masses palpated. No thyromegaly     Eyes: PERRLA, EOMI Cardio: regular rate and rhythm, S1S2 heard, no murmurs appreciated Extremities: warm, well perfused, No edema, cyanosis or clubbing; DP pulses 2+, Monofilament test negative for  changes in sensation MSK: Normal gait and station Neuro: Strength and sensation in upper and lower extremities intact. CN2-12 with normal limit    Asessment/Plan: See problem based a/p  Headache Likely tension headache, has a hx of headaches. Feels that her allergies maybe exacerbating the headache as has not been taking allergy medication and congested. Neurologic exam with normal limits. Slight elevated heart rate/blood pressure likely due to pain.  - ketorolac (TORADOL) 30 MG/ML injection 30 mg; Inject 1 mL (30 mg total) into the muscle once. - ibuprofen (ADVIL,MOTRIN) 800 MG tablet; Take 1 tablet (800 mg total) by mouth every 8 (eight) hours as needed.  Dispense: 30 tablet; Refill:0  - Follow up as need if headaches continue to worsen  - Consider referral to headache clinic vs.try sumatriptan etc.    Neuropathic pain of both feet (HCC) Bilateral "burning sensation in soles of feet", 1 week duration. No motor involvement. No hx of neuropathic pain. No other neurologic findings. Normal neurologic exam. No new medications, no ETOH use, no hx of diabetes, no hx of kidney disease, no lower back pain, no hx of bariatric surgery. Diff to consider diabetes, thyroid issues, tactile hallucinations (not currently on bipolar meds), vitamin deficiency B12/Folate, multiple myeloma, . Unlikely paraneoplastic given patient's age, no issues of fevers or sudden weight loss.   - CBC - COMPLETE METABOLIC PANEL WITH GFR - TSH - POCT glycosylated hemoglobin (Hb A1C) - Folate - Vitamin B12 - gabapentin (NEURONTIN) 100 MG capsule; Take 1 capsule (100 mg total) by mouth 3 (three) times daily.  Dispense: 90 capsule; Refill: 3 - if patient  continues to have symptoms follow up in two weeks - could consider thiamine level, hepatitis C, HIV, and referral for EMG testing

## 2016-05-09 NOTE — Patient Instructions (Addendum)
I will do an initial workup for the burning sensation in your feet, to determine what the cause might be.  I wants to follow up with [facility] this week to make sure you're getting your medications. I will provide you with prescription strength ibuprofen you can take this as needed for headaches. Please follow up with your  PCP in the next 1-2 weeks if the burning sensation in your feet does not resolve. I will give you a medication for this pain. You can take it every 8 hours as needed.

## 2016-05-09 NOTE — Assessment & Plan Note (Signed)
Bilateral "burning sensation in soles of feet", 1 week duration. No motor involvement. No hx of neuropathic pain. No other neurologic findings. Normal neurologic exam. No new medications, no ETOH use, no hx of diabetes, no hx of kidney disease, no lower back pain, no hx of bariatric surgery. Diff to consider diabetes, thyroid issues, tactile hallucinations (not currently on bipolar meds), vitamin deficiency B12/Folate, multiple myeloma, . Unlikely paraneoplastic given patient's age, no issues of fevers or sudden weight loss.   - CBC - COMPLETE METABOLIC PANEL WITH GFR - TSH - POCT glycosylated hemoglobin (Hb A1C) - Folate - Vitamin B12 - gabapentin (NEURONTIN) 100 MG capsule; Take 1 capsule (100 mg total) by mouth 3 (three) times daily.  Dispense: 90 capsule; Refill: 3 - if patient continues to have symptoms follow up in two weeks - could consider thiamine level, hepatitis C, HIV, and referral for EMG testing

## 2016-05-10 LAB — FOLATE: Folate: >24 ng/mL (ref >5.4)

## 2016-05-10 LAB — VITAMIN B12: Vitamin B-12: 314 pg/mL (ref 200–1100)

## 2016-05-17 ENCOUNTER — Telehealth: Payer: Self-pay | Admitting: Internal Medicine

## 2016-05-17 NOTE — Telephone Encounter (Signed)
Called patient to let her know that her A1C is elevated to 6.5 and that qualifies her for diagnosis of diabetes. Discussed that patient needs to follow up with Dr. Caroleen Hammanumley to consider nutrition consult/ starting metformin. Patient voiced understanding plans to follow up in the next 1-2 weeks with Dr. Caroleen Hammanumley.

## 2016-05-18 ENCOUNTER — Ambulatory Visit: Payer: Self-pay | Admitting: Family Medicine

## 2016-05-19 ENCOUNTER — Ambulatory Visit: Payer: Self-pay | Admitting: Student

## 2016-05-23 ENCOUNTER — Ambulatory Visit (INDEPENDENT_AMBULATORY_CARE_PROVIDER_SITE_OTHER): Payer: Medicaid Other | Admitting: Family Medicine

## 2016-05-23 ENCOUNTER — Encounter: Payer: Self-pay | Admitting: Family Medicine

## 2016-05-23 VITALS — BP 138/86 | HR 110 | Temp 98.3°F | Ht 75.0 in | Wt >= 6400 oz

## 2016-05-23 DIAGNOSIS — G43909 Migraine, unspecified, not intractable, without status migrainosus: Secondary | ICD-10-CM | POA: Diagnosis not present

## 2016-05-23 DIAGNOSIS — E119 Type 2 diabetes mellitus without complications: Secondary | ICD-10-CM

## 2016-05-23 DIAGNOSIS — J320 Chronic maxillary sinusitis: Secondary | ICD-10-CM | POA: Diagnosis not present

## 2016-05-23 MED ORDER — FLUTICASONE PROPIONATE 50 MCG/ACT NA SUSP: 2.0000 | Freq: Every day | NASAL | 6 refills | Status: DC

## 2016-05-23 MED ORDER — HYDROCHLOROTHIAZIDE 25 MG PO TABS: 25.0000 mg | ORAL_TABLET | Freq: Every day | ORAL | 0 refills | Status: DC

## 2016-05-23 MED ORDER — METFORMIN HCL ER 500 MG PO TB24: 500.0000 mg | ORAL_TABLET | Freq: Two times a day (BID) | ORAL | 0 refills | Status: DC

## 2016-05-23 MED ORDER — NAPROXEN 500 MG PO TABS: 500.0000 mg | ORAL_TABLET | Freq: Two times a day (BID) | ORAL | 0 refills | Status: DC

## 2016-05-23 MED ORDER — SUMATRIPTAN SUCCINATE 25 MG PO TABS: 25.0000 mg | ORAL_TABLET | Freq: Once | ORAL | 0 refills | Status: DC

## 2016-05-23 NOTE — Patient Instructions (Addendum)
1. Metformin: Take 1 tablet daily for 1 week, then increase to 1 tablet TWICE daily.  This is for your diabetes.  Some people experience stomach upset with this medication.  This is a common side effect and will go away in about 2 weeks.  Stick with the medication and follow up with Dr Caroleen Hammanumley.    2. Naproxen: Take 1 tablet twice daily with food AS NEEDED for headache.  Take this ONLY as needed.  If you take it daily, you may experience withdrawal/ rebound headaches.  STOP taking the Motrin.  3. Imitrex: Take 1 tablet if Naproxen does not work within 1 hour.  You may take another Imitrex in 2 hours if the first does not work.  Never take more than 2 Imitrex tablets in 1 day.  4. Flonase: this is a nasal spray to help with your sinus congestion.  You may use this daily.  For your migraine headaches, make sure you are drinking enough water and are getting enough sleep.  I have referred you to diabetic education.  You should receive a call from them with instructions.   Diabetes Mellitus and Exercise Exercising regularly is important for your overall health, especially when you have diabetes (diabetes mellitus). Exercising is not only about losing weight. It has many health benefits, such as increasing muscle strength and bone density and reducing body fat and stress. This leads to improved fitness, flexibility, and endurance, all of which result in better overall health. Exercise has additional benefits for people with diabetes, including:  Reducing appetite.  Helping to lower and control blood glucose.  Lowering blood pressure.  Helping to control amounts of fatty substances (lipids) in the blood, such as cholesterol and triglycerides.  Helping the body to respond better to insulin (improving insulin sensitivity).  Reducing how much insulin the body needs.  Decreasing the risk for heart disease by:  Lowering cholesterol and triglyceride levels.  Increasing the levels of good  cholesterol.  Lowering blood glucose levels. What is my activity plan? Your health care provider or certified diabetes educator can help you make a plan for the type and frequency of exercise (activity plan) that works for you. Make sure that you:  Do at least 150 minutes of moderate-intensity or vigorous-intensity exercise each week. This could be brisk walking, biking, or water aerobics.  Do stretching and strength exercises, such as yoga or weightlifting, at least 2 times a week.  Spread out your activity over at least 3 days of the week.  Get some form of physical activity every day.  Do not go more than 2 days in a row without some kind of physical activity.  Avoid being inactive for more than 90 minutes at a time. Take frequent breaks to walk or stretch.  Choose a type of exercise or activity that you enjoy, and set realistic goals.  Start slowly, and gradually increase the intensity of your exercise over time. What do I need to know about managing my diabetes?  Check your blood glucose before and after exercising.  If your blood glucose is higher than 240 mg/dL (16.113.3 mmol/L) before you exercise, check your urine for ketones. If you have ketones in your urine, do not exercise until your blood glucose returns to normal.  Know the symptoms of low blood glucose (hypoglycemia) and how to treat it. Your risk for hypoglycemia increases during and after exercise. Common symptoms of hypoglycemia can include:  Hunger.  Anxiety.  Sweating and feeling clammy.  Confusion.  Dizziness or feeling light-headed.  Increased heart rate or palpitations.  Blurry vision.  Tingling or numbness around the mouth, lips, or tongue.  Tremors or shakes.  Irritability.  Keep a rapid-acting carbohydrate snack available before, during, and after exercise to help prevent or treat hypoglycemia.  Avoid injecting insulin into areas of the body that are going to be exercised. For example, avoid  injecting insulin into:  The arms, when playing tennis.  The legs, when jogging.  Keep records of your exercise habits. Doing this can help you and your health care provider adjust your diabetes management plan as needed. Write down:  Food that you eat before and after you exercise.  Blood glucose levels before and after you exercise.  The type and amount of exercise you have done.  When your insulin is expected to peak, if you use insulin. Avoid exercising at times when your insulin is peaking.  When you start a new exercise or activity, work with your health care provider to make sure the activity is safe for you, and to adjust your insulin, medicines, or food intake as needed.  Drink plenty of water while you exercise to prevent dehydration or heat stroke. Drink enough fluid to keep your urine clear or pale yellow. This information is not intended to replace advice given to you by your health care provider. Make sure you discuss any questions you have with your health care provider. Document Released: 08/19/2003 Document Revised: 12/17/2015 Document Reviewed: 11/08/2015 Elsevier Interactive Patient Education  2017 [employer]

## 2016-05-23 NOTE — Progress Notes (Signed)
Subjective: CC: Migraine ZOX:WRUEAHPI:Illana Judie PetitM Excell Brennan is a 40 y.o. female presenting to clinic today for same day appointment. PCP: Karen Heateraleigh Rumley, DO Concerns today include:  1. Migraine Patient seen 05/09/2016 for headache.  At that time thought to be tension headache.  She was given a dose of IM Toradol 30 in clinic and discharged with Motrin 800 mg to take q8 prn.  Patient reports that Motrin is not really working.  She has been alternating with tylenol and Motrin with little relief of headache.  She notes that Toradol injection worked well for a couple of days.  She endorses photophobia, phonophobia, nausea.  No vomiting, blurry vision.  She points to bitemporal areas as site of headache.  She also notes posterior cranial pain.  She reports waxing and waning of headache.  Headache not present in the morning.  Headache lasts hours when she has it.  She reports occ diet coke but no other sources of caffeine intake.  Getting at least 6 hours of sleep during the night.  She is hydrating well during the day.  She reports increased stress.  2. DM2 Patient newly diagnosed in 04/2016 with DM2.  She has not started any medications for this. Patient reports a family history of DM2, noting her mother has DM.  Endorses fatigue, headache.  Denies polydipsia, polyuria.  Social History Reviewed. FamHx and MedHx reviewed.  Please see EMR.  ROS: Per HPI  Objective: Office vital signs reviewed. BP 138/86   Pulse (!) 110   Temp 98.3 F (36.8 C) (Oral)   Ht 6\' 3"  (1.905 m)   Wt (!) 475 lb 9.6 oz (215.7 kg)   SpO2 97%   BMI 59.45 kg/m   Physical Examination:  General: Awake, alert, morbidly obese, No acute distress HEENT: Normal, no TTP to sinuses    Eyes: PERRLA, EOMI, wears corrective lenses    Nose: nasal turbinates moist, no purulence    Throat: moist mucus membranes, no erythema Neuro: Strength and light touch sensation grossly intact, CN 2-12 grossly in tact  Assessment/ Plan: 40 y.o. female    1. Type 2 diabetes mellitus without complication, without long-term current use of insulin (HCC).  A1c 04/2016 was 6.5.  Discussed new diagnosis.  Last BMP reviewed.  She has normal renal function. - Patient to start 1 tablet of metformin daily x1 week then increase to 1 tablet BID.  Discussed possible GI side effects and encouraged to stick with med.   - Ambulatory referral to diabetic education - metFORMIN (GLUCOPHAGE XR) 500 MG 24 hr tablet; Take 1 tablet (500 mg total) by mouth 2 (two) times daily.  Dispense: 60 tablet; Refill: 0 - Follow up with PCP.  Will need repeat A1c in 06/2016.  Will also need DM foot exam, PNA vaccine, TDap  2. Migraine without status migrainosus, not intractable, unspecified migraine type.  Given photophobia/ phonophobia and duration of headaches, I suspect that she is having migraine headaches.  Though also to be considered hyperglycemic headache and sinus headache.  No focal deficits on exam - naproxen (NAPROSYN) 500 MG tablet; Take 1 tablet (500 mg total) by mouth 2 (two) times daily with a meal.  Dispense: 30 tablet; Refill: 0 - SUMAtriptan (IMITREX) 25 MG tablet; Take 1 tablet (25 mg total) by mouth once. May repeat ONE TIME in 2 hours if headache persists or recurs.  Dispense: 10 tablet; Refill: 0 - Toradol 30mg  IM also administered today. - Patient to start Naproxen PRN tomorrow - Discussed in  detail use of above medications.  Written instructions also provided  3. Chronic maxillary sinusitis - Flonase rx'd today   Follow up with Dr Caroleen Hammanumley as scheduled. HCTZ rx'd x90 days until patient sees PCP.   Karen IpAshly M Brooks Stotz, DO PGY-3, [facility]

## 2016-06-02 ENCOUNTER — Encounter: Payer: Self-pay | Admitting: Family Medicine

## 2016-06-03 ENCOUNTER — Other Ambulatory Visit: Payer: Self-pay | Admitting: Family Medicine

## 2016-06-03 DIAGNOSIS — G43909 Migraine, unspecified, not intractable, without status migrainosus: Secondary | ICD-10-CM

## 2016-06-14 ENCOUNTER — Encounter: Payer: Self-pay | Admitting: Family Medicine

## 2016-06-18 ENCOUNTER — Other Ambulatory Visit: Payer: Self-pay | Admitting: Family Medicine

## 2016-06-18 DIAGNOSIS — E119 Type 2 diabetes mellitus without complications: Secondary | ICD-10-CM

## 2016-06-20 ENCOUNTER — Ambulatory Visit: Payer: Self-pay

## 2016-06-23 ENCOUNTER — Ambulatory Visit (INDEPENDENT_AMBULATORY_CARE_PROVIDER_SITE_OTHER): Payer: Medicaid Other | Admitting: Family Medicine

## 2016-06-23 ENCOUNTER — Other Ambulatory Visit (HOSPITAL_COMMUNITY)
Admission: RE | Admit: 2016-06-23 | Discharge: 2016-06-23 | Disposition: A | Discharge status: Home / Self Care | Payer: Medicaid Other | Source: Ambulatory Visit | Attending: Family Medicine | Admitting: Family Medicine

## 2016-06-23 ENCOUNTER — Encounter: Payer: Self-pay | Admitting: Family Medicine

## 2016-06-23 VITALS — BP 132/84 | HR 110 | Temp 98.2°F | Ht 75.0 in | Wt >= 6400 oz

## 2016-06-23 DIAGNOSIS — Z01411 Encounter for gynecological examination (general) (routine) with abnormal findings: Secondary | ICD-10-CM | POA: Diagnosis not present

## 2016-06-23 DIAGNOSIS — E119 Type 2 diabetes mellitus without complications: Secondary | ICD-10-CM | POA: Diagnosis not present

## 2016-06-23 DIAGNOSIS — Z124 Encounter for screening for malignant neoplasm of cervix: Secondary | ICD-10-CM

## 2016-06-23 DIAGNOSIS — R531 Weakness: Secondary | ICD-10-CM | POA: Diagnosis not present

## 2016-06-23 DIAGNOSIS — N912 Amenorrhea, unspecified: Secondary | ICD-10-CM | POA: Diagnosis not present

## 2016-06-23 NOTE — Patient Instructions (Signed)
Thank you so much for coming to visit today! I have refilled your medications. Please let me know if you have any problems filling these. I will order future labs. Please come at your convenience to have these drawn. We obtained a pap smear today. I will let you know the results. I have placed a referral to Ob/Gyn. Please return the end of February or beginning of March for your next visit or sooner if needed.  Dr. Caroleen Hammanumley

## 2016-06-26 ENCOUNTER — Other Ambulatory Visit: Payer: Self-pay

## 2016-06-26 DIAGNOSIS — E119 Type 2 diabetes mellitus without complications: Secondary | ICD-10-CM | POA: Insufficient documentation

## 2016-06-26 DIAGNOSIS — R7303 Prediabetes: Secondary | ICD-10-CM | POA: Insufficient documentation

## 2016-06-26 NOTE — Progress Notes (Signed)
Subjective:     Patient ID: Karen Brennan, female   DOB: 01/27/1976, 41 y.o.   MRN: [id number]  HPI Karen Brennan is a 41yo female presenting today for Diabetes Follow Up. Also notes weakness.   # Diabetes: Recently diagnosed with type 2 diabetes with A1C of 6.5 in 04/2016. Though she was due for next A1C in January instead of February 2018. Currently taking Metformin and requests refill. Does not check CBGs at home.   # Weakness: Notes weakness and fatigue for the last several months, but seems to be getting worse over the last several weeks. Denies bleeding, including blood in stool or dark stools. Reports she only has one menstrual period a year and can't remember when the last one was--states this has been her baseline since Ablation several years ago. Denies dizziness. Reports history of anemia, but states this was prior to Ablation. No longer taking Iron supplementation. Does not wish to remain for labs today, but agrees to return if future labs are ordered.  # Health Maintenance: Last Pap Smear in 05/2015 with LSIL however HSIL could not be excluded in some areas, high risk HPV detected. Colposcopy in 05/2015 without signs of abnormality, however recommended Pap Smears every 6months. Unfortunately, Karen Brennan has not returned for Pap Smears since that time.  Requests refill of Fluocinolone (used for dry scalp), HCTZ, and Naproxen and Sumatriptan (both used for Migraines). Former Smoker  Review of Systems Per HPI    Objective:   Physical Exam  Constitutional: She appears well-developed and well-nourished. No distress.  HENT:  Head: Normocephalic and atraumatic.  Mouth/Throat: Oropharynx is clear and moist.  Eyes:  Conjunctiva pink  Cardiovascular: Normal rate and regular rhythm.   No murmur heard. Pulmonary/Chest: Effort normal. No respiratory distress. She has no wheezes.  Abdominal: Soft. She exhibits no distension. There is no tenderness.  Genitourinary:  Genitourinary  Comments: No vaginal discharge noted. No cervical motion tenderness or adnexal tenderness. No vaginal wall tenderness.   Musculoskeletal: She exhibits no edema.  Lymphadenopathy:    She has no cervical adenopathy.  Psychiatric: She has a normal mood and affect. Her behavior is normal.      Assessment and Plan:     Diabetes (HCC) Return in one month to recheck A1C. Will await further workup of weakness prior to increasing dose of Metformin, but anticipate increasing to 1000mg  twice daily dosing at next office visit.  Weakness Will obtain CBC, BMP, and TSH to further evaluate. To discuss further following lab results at next office visit.   Health Maintenance Pap Smear obtained. Referral to Ob/Gyn placed given reports of only one menstrual period yearly since Ablation. Medications refilled; reviewed and updated in Epic.

## 2016-06-26 NOTE — Assessment & Plan Note (Signed)
Return in one month to recheck A1C. Will await further workup of weakness prior to increasing dose of Metformin, but anticipate increasing to 1000mg  twice daily dosing at next office visit.

## 2016-06-27 ENCOUNTER — Ambulatory Visit: Payer: Self-pay

## 2016-06-29 LAB — CYTOLOGY - PAP: Diagnosis: NEGATIVE

## 2016-06-30 ENCOUNTER — Telehealth: Payer: Self-pay | Admitting: Family Medicine

## 2016-06-30 NOTE — Telephone Encounter (Signed)
Attempted to contact concerning results without response. Please continue to attempt contact. Pap Smear did not show any signs concerning for malignancy that was shown on previous pap smear. We will need to repeat this in one year. If normal in one year, may space back out to normal screenings.

## 2016-07-03 NOTE — Telephone Encounter (Signed)
Pt informed. Karen Brennan T Nemiah Kissner, CMA  

## 2016-07-04 ENCOUNTER — Ambulatory Visit: Payer: Self-pay

## 2016-07-05 ENCOUNTER — Other Ambulatory Visit: Payer: Self-pay | Admitting: Family Medicine

## 2016-07-05 ENCOUNTER — Other Ambulatory Visit: Payer: Medicaid Other

## 2016-07-05 DIAGNOSIS — G43909 Migraine, unspecified, not intractable, without status migrainosus: Secondary | ICD-10-CM

## 2016-07-05 DIAGNOSIS — R531 Weakness: Secondary | ICD-10-CM

## 2016-07-05 LAB — BASIC METABOLIC PANEL WITH GFR
BUN: 9 mg/dL (ref 7–25)
CALCIUM: 9.4 mg/dL (ref 8.6–10.2)
CO2: 26 mmol/L (ref 20–31)
CREATININE: 0.82 mg/dL (ref 0.50–1.10)
Chloride: 102 mmol/L (ref 98–110)
GFR, Est African American: >89 mL/min (ref >=60)
GLUCOSE: 120 mg/dL — AB (ref 65–99)
Potassium: 4.1 mmol/L (ref 3.5–5.3)
SODIUM: 139 mmol/L (ref 135–146)

## 2016-07-05 LAB — CBC
HCT: 39.2 % (ref 35.0–45.0)
Hemoglobin: 12.7 g/dL (ref 11.7–15.5)
MCH: 27.3 pg (ref 27.0–33.0)
MCHC: 32.4 g/dL (ref 32.0–36.0)
MCV: 84.1 fL (ref 80.0–100.0)
MPV: 10.2 fL (ref 7.5–12.5)
Platelets: 385 10*3/uL (ref 140–400)
RBC: 4.66 MIL/uL (ref 3.80–5.10)
RDW: 14.4 % (ref 11.0–15.0)
WBC: 9 10*3/uL (ref 3.8–10.8)

## 2016-07-05 LAB — TSH: TSH: 1.55 mIU/L

## 2016-07-05 NOTE — Telephone Encounter (Signed)
Patient should NOT be taking this medication daily.  This is a prn med.  If she is still having severe headaches, please have her follow up with her PCP Dr Caroleen Hammanumley.  Refill provided x1.

## 2016-07-06 NOTE — Telephone Encounter (Signed)
Pt informed and will call and schedule and appointment. Lamonte SakaiZimmerman Rumple, April D, [place]

## 2016-07-14 ENCOUNTER — Encounter: Payer: Self-pay | Admitting: Family Medicine

## 2016-07-18 ENCOUNTER — Other Ambulatory Visit: Payer: Self-pay | Admitting: Family Medicine

## 2016-07-18 MED ORDER — OMEPRAZOLE 20 MG PO CPDR: 20.0000 mg | DELAYED_RELEASE_CAPSULE | Freq: Every day | ORAL | 3 refills | Status: DC

## 2016-07-18 MED ORDER — POLYETHYLENE GLYCOL 3350 17 G PO PACK: 17.0000 g | PACK | Freq: Every day | ORAL | 3 refills | Status: DC | PRN

## 2016-07-21 ENCOUNTER — Encounter (HOSPITAL_COMMUNITY): Payer: Self-pay

## 2016-07-21 ENCOUNTER — Emergency Department (HOSPITAL_COMMUNITY): Payer: No Typology Code available for payment source

## 2016-07-21 ENCOUNTER — Emergency Department (HOSPITAL_COMMUNITY)
Admission: EM | Admit: 2016-07-21 | Discharge: 2016-07-21 | Disposition: A | Discharge status: Home / Self Care | Payer: No Typology Code available for payment source | Attending: Emergency Medicine | Admitting: Emergency Medicine

## 2016-07-21 DIAGNOSIS — M79602 Pain in left arm: Secondary | ICD-10-CM

## 2016-07-21 DIAGNOSIS — Z7984 Long term (current) use of oral hypoglycemic drugs: Secondary | ICD-10-CM | POA: Insufficient documentation

## 2016-07-21 DIAGNOSIS — E119 Type 2 diabetes mellitus without complications: Secondary | ICD-10-CM | POA: Insufficient documentation

## 2016-07-21 DIAGNOSIS — S199XXA Unspecified injury of neck, initial encounter: Secondary | ICD-10-CM | POA: Diagnosis present

## 2016-07-21 DIAGNOSIS — Y939 Activity, unspecified: Secondary | ICD-10-CM | POA: Diagnosis not present

## 2016-07-21 DIAGNOSIS — Y92410 Unspecified street and highway as the place of occurrence of the external cause: Secondary | ICD-10-CM | POA: Insufficient documentation

## 2016-07-21 DIAGNOSIS — Z87891 Personal history of nicotine dependence: Secondary | ICD-10-CM | POA: Insufficient documentation

## 2016-07-21 DIAGNOSIS — Y999 Unspecified external cause status: Secondary | ICD-10-CM | POA: Diagnosis not present

## 2016-07-21 DIAGNOSIS — S139XXA Sprain of joints and ligaments of unspecified parts of neck, initial encounter: Secondary | ICD-10-CM

## 2016-07-21 DIAGNOSIS — I10 Essential (primary) hypertension: Secondary | ICD-10-CM | POA: Insufficient documentation

## 2016-07-21 DIAGNOSIS — E041 Nontoxic single thyroid nodule: Secondary | ICD-10-CM | POA: Insufficient documentation

## 2016-07-21 MED ORDER — OXYCODONE-ACETAMINOPHEN 5-325 MG PO TABS
1.0000 | ORAL_TABLET | Freq: Once | ORAL | Status: AC
  Administered 2016-07-21: 1 via ORAL
  Filled 2016-07-21: qty 1

## 2016-07-21 MED ORDER — ONDANSETRON 8 MG PO TBDP
8.0000 mg | ORAL_TABLET | Freq: Once | ORAL | Status: AC
  Administered 2016-07-21: 8 mg via ORAL
  Filled 2016-07-21: qty 1

## 2016-07-21 MED ORDER — IBUPROFEN 600 MG PO TABS: 600.0000 mg | ORAL_TABLET | Freq: Four times a day (QID) | ORAL | 0 refills | Status: DC | PRN

## 2016-07-21 MED ORDER — HYDROCODONE-ACETAMINOPHEN 5-325 MG PO TABS: 1.0000 | ORAL_TABLET | Freq: Four times a day (QID) | ORAL | 0 refills | Status: DC | PRN

## 2016-07-21 MED ORDER — METHOCARBAMOL 750 MG PO TABS: 750.0000 mg | ORAL_TABLET | Freq: Four times a day (QID) | ORAL | 0 refills | Status: DC | PRN
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
## 2016-07-21 NOTE — Discharge Instructions (Signed)
Take ibuprofen for pain. Take norco for severe pain. Robaxin for spasms. Try stretching, heat. Follow up with family doctor to make sure your pain improving and for further work up of your thyroid nodule.
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
## 2016-08-03 ENCOUNTER — Other Ambulatory Visit: Payer: Self-pay | Admitting: Chiropractic Medicine

## 2016-08-03 ENCOUNTER — Ambulatory Visit
Admission: RE | Admit: 2016-08-03 | Discharge: 2016-08-03 | Disposition: A | Discharge status: Home / Self Care | Payer: Medicaid Other | Source: Ambulatory Visit | Attending: Chiropractic Medicine | Admitting: Chiropractic Medicine

## 2016-08-03 DIAGNOSIS — T1490XA Injury, unspecified, initial encounter: Secondary | ICD-10-CM

## 2016-08-14 ENCOUNTER — Encounter: Payer: Self-pay | Admitting: Medical

## 2016-08-19 ENCOUNTER — Other Ambulatory Visit: Payer: Self-pay | Admitting: Family Medicine

## 2016-08-21 ENCOUNTER — Other Ambulatory Visit: Payer: Self-pay | Admitting: *Deleted

## 2016-08-21 DIAGNOSIS — G5793 Unspecified mononeuropathy of bilateral lower limbs: Secondary | ICD-10-CM

## 2016-08-21 MED ORDER — HYDROCHLOROTHIAZIDE 25 MG PO TABS: 25.0000 mg | ORAL_TABLET | Freq: Every day | ORAL | 4 refills | Status: DC

## 2016-08-21 MED ORDER — GABAPENTIN 100 MG PO CAPS: 100.0000 mg | ORAL_CAPSULE | Freq: Three times a day (TID) | ORAL | 3 refills | Status: DC

## 2016-08-21 NOTE — Telephone Encounter (Signed)
Has appt for 08/31/16 . Fleeger, Maryjo RochesterJessica Dawn, CMA

## 2016-08-22 ENCOUNTER — Encounter: Payer: Self-pay | Admitting: Advanced Practice Midwife

## 2016-08-31 ENCOUNTER — Ambulatory Visit (INDEPENDENT_AMBULATORY_CARE_PROVIDER_SITE_OTHER): Payer: Medicaid Other | Admitting: Family Medicine

## 2016-08-31 ENCOUNTER — Encounter: Payer: Self-pay | Admitting: Family Medicine

## 2016-08-31 VITALS — BP 124/80 | HR 78 | Temp 97.8°F | Ht 75.0 in | Wt >= 6400 oz

## 2016-08-31 DIAGNOSIS — E119 Type 2 diabetes mellitus without complications: Secondary | ICD-10-CM | POA: Diagnosis not present

## 2016-08-31 DIAGNOSIS — L853 Xerosis cutis: Secondary | ICD-10-CM | POA: Diagnosis not present

## 2016-08-31 DIAGNOSIS — E041 Nontoxic single thyroid nodule: Secondary | ICD-10-CM | POA: Diagnosis present

## 2016-08-31 LAB — POCT GLYCOSYLATED HEMOGLOBIN (HGB A1C): Hemoglobin A1C: 6

## 2016-08-31 NOTE — Patient Instructions (Signed)
Thank you so much for coming to visit today! Your blood pressure is great! We will check your A1C today and adjust your medication if needed. We will also check your cholesterol. I have ordered a thyroid ultrasound to follow up the nodule. I have placed a referral to Dermatology.  Please return in 3 months. If your A1C is lower, we may push this back to 6months.  Dr. Caroleen Hammanumley

## 2016-09-01 LAB — COMPREHENSIVE METABOLIC PANEL
ALT: 25 IU/L (ref 0–32)
AST: 24 IU/L (ref 0–40)
Albumin/Globulin Ratio: 1.4 (ref 1.2–2.2)
Albumin: 4.1 g/dL (ref 3.5–5.5)
Alkaline Phosphatase: 62 IU/L (ref 39–117)
BILIRUBIN TOTAL: 0.5 mg/dL (ref 0.0–1.2)
BUN / CREAT RATIO: 14 (ref 9–23)
BUN: 11 mg/dL (ref 6–20)
CALCIUM: 9.4 mg/dL (ref 8.7–10.2)
CO2: 26 mmol/L (ref 18–29)
CREATININE: 0.8 mg/dL (ref 0.57–1.00)
Chloride: 100 mmol/L (ref 96–106)
GFR calc non Af Amer: 93 mL/min/{1.73_m2} (ref >59)
GFR, EST AFRICAN AMERICAN: 107 mL/min/{1.73_m2} (ref >59)
GLUCOSE: 98 mg/dL (ref 65–99)
Globulin, Total: 3 g/dL (ref 1.5–4.5)
Potassium: 4.4 mmol/L (ref 3.5–5.2)
Sodium: 142 mmol/L (ref 134–144)
TOTAL PROTEIN: 7.1 g/dL (ref 6.0–8.5)

## 2016-09-01 LAB — LIPID PANEL
CHOL/HDL RATIO: 3.5 ratio (ref 0.0–4.4)
Cholesterol, Total: 152 mg/dL (ref 100–199)
HDL: 44 mg/dL (ref >39)
LDL Calculated: 89 mg/dL (ref 0–99)
Triglycerides: 97 mg/dL (ref 0–149)
VLDL Cholesterol Cal: 19 mg/dL (ref 5–40)

## 2016-09-03 MED ORDER — ATORVASTATIN CALCIUM 20 MG PO TABS: 20.0000 mg | ORAL_TABLET | Freq: Every day | ORAL | 3 refills | Status: DC
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
## 2016-09-04 ENCOUNTER — Telehealth: Payer: Self-pay | Admitting: *Deleted

## 2016-09-04 NOTE — Telephone Encounter (Signed)
Contacted pt to inform her of her US appointment that is scheduled for 09/08/16 @ 11:30am with an 11:15am arrival.  [facility] Lamonte SakaiZimmerman Rumple, Andrius Andrepont D, [place]

## 2016-09-08 ENCOUNTER — Ambulatory Visit (HOSPITAL_COMMUNITY): Payer: Medicaid Other

## 2016-09-12 ENCOUNTER — Encounter: Payer: Self-pay | Admitting: Family Medicine

## 2016-09-13 ENCOUNTER — Telehealth: Payer: Self-pay | Admitting: Family Medicine

## 2016-09-13 NOTE — Telephone Encounter (Signed)
Pt is calling and would like to have her lab results. jw °

## 2016-09-13 NOTE — Telephone Encounter (Signed)
Letter sent with lab results.  

## 2016-09-13 NOTE — Telephone Encounter (Signed)
Pt informed of below and she just wanted to be sure that no adjustments needed to be made due to her results.  Routing to PCP to see if there are any changes being made. Lamonte Sakai, April D, [place]

## 2016-09-14 NOTE — Telephone Encounter (Signed)
Labs normal and no adjustments to be made. To go for [place] of thyroid as ordered.

## 2016-09-14 NOTE — Telephone Encounter (Signed)
Pt informed of below. Zimmerman Rumple, Sharmin Foulk D, CMA  

## 2016-09-18 ENCOUNTER — Ambulatory Visit (HOSPITAL_COMMUNITY)
Admission: RE | Admit: 2016-09-18 | Discharge: 2016-09-18 | Disposition: A | Discharge status: Home / Self Care | Payer: Medicaid Other | Source: Ambulatory Visit | Attending: Family Medicine | Admitting: Family Medicine

## 2016-09-18 DIAGNOSIS — E042 Nontoxic multinodular goiter: Secondary | ICD-10-CM | POA: Diagnosis not present

## 2016-09-18 DIAGNOSIS — E041 Nontoxic single thyroid nodule: Secondary | ICD-10-CM | POA: Diagnosis present

## 2016-09-25 ENCOUNTER — Telehealth: Payer: Self-pay | Admitting: Family Medicine

## 2016-09-25 DIAGNOSIS — E041 Nontoxic single thyroid nodule: Secondary | ICD-10-CM

## 2016-09-25 NOTE — Telephone Encounter (Signed)
Contacted concerning thyroid US results. Referral to Interventional Radiology placed.

## 2016-09-26 NOTE — Addendum Note (Signed)
Addended by: Araceli Bouche on: 09/26/2016 01:31 PM   Modules accepted: Orders

## 2016-10-03 ENCOUNTER — Ambulatory Visit: Payer: Self-pay | Admitting: Obstetrics and Gynecology

## 2016-10-05 ENCOUNTER — Inpatient Hospital Stay: Admission: RE | Admit: 2016-10-05 | Payer: Self-pay | Source: Ambulatory Visit

## 2016-10-17 ENCOUNTER — Other Ambulatory Visit (HOSPITAL_COMMUNITY)
Admission: RE | Admit: 2016-10-17 | Discharge: 2016-10-17 | Disposition: A | Discharge status: Home / Self Care | Payer: Medicaid Other | Source: Ambulatory Visit | Attending: Radiology | Admitting: Radiology

## 2016-10-17 ENCOUNTER — Ambulatory Visit
Admission: RE | Admit: 2016-10-17 | Discharge: 2016-10-17 | Disposition: A | Discharge status: Home / Self Care | Payer: Medicaid Other | Source: Ambulatory Visit | Attending: Family Medicine | Admitting: Family Medicine

## 2016-10-17 DIAGNOSIS — E041 Nontoxic single thyroid nodule: Secondary | ICD-10-CM

## 2016-10-19 ENCOUNTER — Telehealth: Payer: Self-pay | Admitting: Family Medicine

## 2016-10-19 NOTE — Telephone Encounter (Signed)
Attempted to contact with biopsy results without response. Will continue to attempt contact.

## 2016-10-20 ENCOUNTER — Telehealth: Payer: Self-pay

## 2016-10-20 NOTE — Telephone Encounter (Signed)
Would like results of biopsy. Please call. Verified number on file is correct. Sunday SpillersSharon T Saunders, CMA

## 2016-10-21 ENCOUNTER — Other Ambulatory Visit: Payer: Self-pay | Admitting: Family Medicine

## 2016-10-21 DIAGNOSIS — E119 Type 2 diabetes mellitus without complications: Secondary | ICD-10-CM

## 2016-10-24 ENCOUNTER — Telehealth: Payer: Self-pay | Admitting: Family Medicine

## 2016-10-24 NOTE — Telephone Encounter (Signed)
Pt called for biospy results.

## 2016-10-26 NOTE — Telephone Encounter (Signed)
Pt informed. Sharon T Saunders, CMA  

## 2016-10-26 NOTE — Telephone Encounter (Signed)
Out of office. Please let Karen Brennan know that her biopsy returned as benign and is not cancer. Recommend repeating an ultrasound in 1-2 years to monitor for growth.

## 2016-11-01 LAB — GLUCOSE, POCT (MANUAL RESULT ENTRY): POC Glucose: 133 mg/dl — AB (ref 70–99)

## 2016-11-07 ENCOUNTER — Encounter: Payer: Medicaid Other | Attending: Family Medicine | Admitting: Dietician

## 2016-11-07 DIAGNOSIS — E119 Type 2 diabetes mellitus without complications: Secondary | ICD-10-CM | POA: Insufficient documentation

## 2016-11-07 DIAGNOSIS — Z713 Dietary counseling and surveillance: Secondary | ICD-10-CM | POA: Insufficient documentation

## 2016-11-07 NOTE — Progress Notes (Addendum)
Patient was seen on 11/07/16 for the first of a series of three diabetes self-management courses at the [facility].  Patient Education Plan per assessed needs and concerns is to attend four course education program for Diabetes Self Management Education.  Provided patient with a meter:  Accu Chek Guide Lot [id number], Exp. 08/30/17.  The following learning objectives were met by the patient during this class:  Describe diabetes  State some common risk factors for diabetes  Defines the role of glucose and insulin  Identifies type of diabetes and pathophysiology  Describe the relationship between diabetes and cardiovascular risk  State the members of the Healthcare Team  States the rationale for glucose monitoring  State when to test glucose  State their individual Target Range  State the importance of logging glucose readings  Describe how to interpret glucose readings  Identifies A1C target  Explain the correlation between A1c and eAG values  State symptoms and treatment of high blood glucose  State symptoms and treatment of low blood glucose  Explain proper technique for glucose testing  Identifies proper sharps disposal  Handouts given during class include:  Living Well with Diabetes book  Carb Counting and Meal Planning book  Meal Plan Card  Carbohydrate guide  Meal planning worksheet  Low Sodium Flavoring Tips  The diabetes portion plate  J6E to eAG Conversion Chart  Diabetes Medications  Diabetes Recommended Care Schedule  Support Group  Diabetes Success Plan  Core Class Satisfaction Survey  Follow-Up Plan:  Attend core 2

## 2016-11-13 ENCOUNTER — Encounter: Payer: Self-pay | Admitting: Student

## 2016-11-13 ENCOUNTER — Ambulatory Visit (INDEPENDENT_AMBULATORY_CARE_PROVIDER_SITE_OTHER): Payer: Medicaid Other | Admitting: Student

## 2016-11-13 DIAGNOSIS — R21 Rash and other nonspecific skin eruption: Secondary | ICD-10-CM | POA: Diagnosis present

## 2016-11-13 NOTE — Assessment & Plan Note (Signed)
Rash consistent with insect bites, given location of the lesions there is concern for bed bugs. Will start by washing bed linens and clothes and consider calling and exterminator for evaluation - will follow as needed

## 2016-11-13 NOTE — Progress Notes (Signed)
   Subjective:    Patient ID: Karen Brennan, female    DOB: 12/11/1975, 41 y.o.   MRN: [id number]   CC: rash  HPI: 41 y/o F presents for itchy rash  Itchy rash - started 1-2 weeks ago -  No other people in the house has this rash -   Smoking status reviewed  Review of Systems  Per HPI, else denies recent illness, fever, chest pain, shortness of breath,    Objective:  BP 132/80   Pulse (!) 105   Temp 98.2 F (36.8 C) (Oral)   Wt (!) 453 lb (205.5 kg)   SpO2 98%   BMI 56.62 kg/m  Vitals and nursing note reviewed  General: NAD Cardiac: RRR,  Respiratory: CTAB, normal effort Skin: raised lesions noted on ankles and arms in linear distribution, else warm and dry Neuro: alert and oriented, no focal deficits   Assessment & Plan:    Rash and nonspecific skin eruption Rash consistent with insect bites, given location of the lesions there is concern for bed bugs. Will start by washing bed linens and clothes and consider calling and exterminator for evaluation - will follow as needed    Alaney Witter A. Kennon RoundsHaney MD, MS Family Medicine Resident PGY-3 Pager [phone]

## 2016-11-13 NOTE — Patient Instructions (Signed)
Follow up as needed Keep your skin well hydrated with thick lotion like Eucerin Wash all bed linens and clothing in hot water Consider calling an exterminator Call the office with questions or concerns

## 2016-11-14 ENCOUNTER — Encounter: Payer: Medicaid Other | Attending: Family Medicine | Admitting: Dietician

## 2016-11-14 DIAGNOSIS — E119 Type 2 diabetes mellitus without complications: Secondary | ICD-10-CM

## 2016-11-14 DIAGNOSIS — Z713 Dietary counseling and surveillance: Secondary | ICD-10-CM | POA: Insufficient documentation

## 2016-11-14 NOTE — Progress Notes (Signed)

## 2016-11-21 ENCOUNTER — Telehealth: Payer: Self-pay | Admitting: Family Medicine

## 2016-11-21 ENCOUNTER — Encounter: Payer: Medicaid Other | Admitting: Dietician

## 2016-11-21 DIAGNOSIS — E119 Type 2 diabetes mellitus without complications: Secondary | ICD-10-CM

## 2016-11-21 DIAGNOSIS — Z713 Dietary counseling and surveillance: Secondary | ICD-10-CM | POA: Diagnosis not present

## 2016-11-21 NOTE — Progress Notes (Signed)
Patient was seen on 11/21/16 for the third of a series of three diabetes self-management courses at the [facility].   Karen Brennan. State the amount of activity recommended for healthy living . Describe activities suitable for individual needs . Identify ways to regularly incorporate activity into daily life . Identify barriers to activity and ways to over come these barriers  Identify diabetes medications being personally used and their primary action for lowering glucose and possible side effects . Describe role of stress on blood glucose and develop strategies to address psychosocial issues . Identify diabetes complications and ways to prevent them  Explain how to manage diabetes during illness . Evaluate success in meeting personal goal . Establish 2-3 goals that they will plan to diligently work on until they return for the  9490-month follow-up visit  Goals:   I will count my carb choices at most meals and snacks  I will be active 30 minutes or more 4 times a week  I will eat less unhealthy fats by eating less fatty foods  I will test my glucose at least 3 times a day, 3 days a week  To help manage stress I will  Find time for me at least 3 times a week  Your patient has identified these potential barriers to change:  Motivation Finances Stress Lack of Family Support  Your patient has identified their diabetes self-care support plan as  "Get help from my kids." Plan:  Attend Support Group as desired

## 2016-11-21 NOTE — Telephone Encounter (Signed)
Pt received a glucose meter today from the diabetic class she went to. Pt needs Accu Check test strips and lancets. Pt uses [facility] [address] ep

## 2016-11-30 ENCOUNTER — Encounter: Payer: Self-pay | Admitting: Family Medicine

## 2016-11-30 ENCOUNTER — Ambulatory Visit (INDEPENDENT_AMBULATORY_CARE_PROVIDER_SITE_OTHER): Payer: Medicaid Other | Admitting: Family Medicine

## 2016-11-30 VITALS — BP 150/75 | HR 109 | Temp 98.1°F | Ht 75.0 in | Wt >= 6400 oz

## 2016-11-30 DIAGNOSIS — L739 Follicular disorder, unspecified: Secondary | ICD-10-CM | POA: Diagnosis not present

## 2016-11-30 MED ORDER — ACCU-CHEK FASTCLIX LANCETS MISC: 6 refills | Status: DC

## 2016-11-30 MED ORDER — DOXYCYCLINE HYCLATE 100 MG PO TABS: 100.0000 mg | ORAL_TABLET | Freq: Two times a day (BID) | ORAL | 0 refills | Status: DC

## 2016-11-30 MED ORDER — GLUCOSE BLOOD VI STRP: ORAL_STRIP | 12 refills | Status: DC

## 2016-11-30 NOTE — Patient Instructions (Signed)
Thank you so much for coming to visit today! It is difficult to say what bug is causing the bites without seeing the bugs. Please continue hydrocortisone cream for the itching. I have also sent a prescription for antibiotics to the pharmacy. Please return if no improvement. Return in September for your next A1C check.  Dr. Caroleen Hammanumley

## 2016-12-03 NOTE — Progress Notes (Signed)
Subjective:     Patient ID: Karen Brennan, female   DOB: 10/14/1975, 41 y.o.   MRN: [id number]  HPI [name] is a 41yo female presenting today for follow up for rash. Previously seen on 11/13/16 for rash, diagnosed with bed bugs. Has washed clothes and bedding and had exterminator come for evaluation of home--no bed bugs discovered. Rash was initially on feet, but now on arms, legs, and bottom. Rash itches. Has not seen any bugs biting her. Has tried Aveeno, Benadryl, and Hydrocortisone without relief. Denies fever. Denies oral lesion. Reports family member diagnosed with MRSA. Former Smoker.  Review of Systems Per HPI    Objective:   Physical Exam  Constitutional: She appears well-developed and well-nourished. No distress.  HENT:  Mouth/Throat: Oropharynx is clear and moist.  Cardiovascular: Normal rate and regular rhythm.   No murmur heard. Pulmonary/Chest: Effort normal. No respiratory distress. She has no wheezes.  Skin:  Maculopapular rash noted on arms and legs. Small pustule noted on dorsum or right hand.  Psychiatric: She has a normal mood and affect. Her behavior is normal.      Assessment and Plan:     1. Folliculitis Pustule on right hand opened with needle after sterilization with alcohol pad x2, small amount of drainage noted. Doxycycline prescribed. May continue to use hydrocortisone cream. Follow up if no improvement. Next A1C due in 02/2017.

## 2016-12-28 ENCOUNTER — Other Ambulatory Visit: Payer: Self-pay | Admitting: Urology

## 2016-12-28 DIAGNOSIS — N200 Calculus of kidney: Secondary | ICD-10-CM

## 2017-01-03 ENCOUNTER — Ambulatory Visit (HOSPITAL_COMMUNITY): Admission: RE | Admit: 2017-01-03 | Payer: Medicaid Other | Source: Ambulatory Visit

## 2017-01-09 ENCOUNTER — Ambulatory Visit (HOSPITAL_COMMUNITY)
Admission: RE | Admit: 2017-01-09 | Discharge: 2017-01-09 | Disposition: A | Discharge status: Home / Self Care | Payer: Medicaid Other | Source: Ambulatory Visit | Attending: Urology | Admitting: Urology

## 2017-01-09 DIAGNOSIS — K760 Fatty (change of) liver, not elsewhere classified: Secondary | ICD-10-CM | POA: Diagnosis not present

## 2017-01-09 DIAGNOSIS — N200 Calculus of kidney: Secondary | ICD-10-CM

## 2017-02-07 ENCOUNTER — Ambulatory Visit: Payer: Medicaid Other | Admitting: Internal Medicine

## 2017-02-16 ENCOUNTER — Encounter: Payer: Self-pay | Admitting: Internal Medicine

## 2017-02-16 ENCOUNTER — Ambulatory Visit (INDEPENDENT_AMBULATORY_CARE_PROVIDER_SITE_OTHER): Payer: Medicaid Other | Admitting: Internal Medicine

## 2017-02-16 ENCOUNTER — Telehealth: Payer: Self-pay | Admitting: Internal Medicine

## 2017-02-16 VITALS — BP 126/82 | HR 100 | Temp 98.6°F | Ht 75.0 in | Wt >= 6400 oz

## 2017-02-16 DIAGNOSIS — B86 Scabies: Secondary | ICD-10-CM

## 2017-02-16 DIAGNOSIS — E119 Type 2 diabetes mellitus without complications: Secondary | ICD-10-CM

## 2017-02-16 DIAGNOSIS — Z23 Encounter for immunization: Secondary | ICD-10-CM

## 2017-02-16 DIAGNOSIS — E162 Hypoglycemia, unspecified: Secondary | ICD-10-CM

## 2017-02-16 LAB — POCT GLYCOSYLATED HEMOGLOBIN (HGB A1C): Hemoglobin A1C: 6.2

## 2017-02-16 LAB — GLUCOSE, POCT (MANUAL RESULT ENTRY): POC GLUCOSE: 130 mg/dL — AB (ref 70–99)

## 2017-02-16 MED ORDER — PERMETHRIN 5 % EX CREA: 1.0000 "application " | TOPICAL_CREAM | Freq: Once | CUTANEOUS | 0 refills | Status: AC

## 2017-02-16 MED ORDER — ACCU-CHEK AVIVA CONNECT W/DEVICE KIT: 1.0000 | PACK | Freq: Every day | 0 refills | Status: DC

## 2017-02-16 MED ORDER — ACCU-CHEK SOFTCLIX LANCET DEV MISC: 0 refills | Status: DC

## 2017-02-16 MED ORDER — ACCU-CHEK MULTICLIX LANCETS MISC: 3 refills | Status: DC

## 2017-02-16 MED ORDER — GLUCOSE BLOOD VI STRP: ORAL_STRIP | 12 refills | Status: DC

## 2017-02-16 NOTE — Progress Notes (Signed)
Subjective:    Karen Brennan - 41 y.o. female MRN [id number]  Date of birth: 05-06-1976  HPI  Karen Brennan is here for hypoglycemia and rash.  Hypoglycemia: Patient with h/o well controlled T2DM. On Metformin 500 mg ER since December 2017. Reports she is having episodes of low blood sugars. She will feel light headed, clammy, jittery, and globally weak. This prompts her to check her CBGs and reports they will be 30-40. She has never had LOC or near syncope. She is using her cousins meter. It is present with her today but the battery is dead so unable to check recent CBGs. Last checked CBG about 2 days ago. Average number when not feeling symptomatic is 90-120. She eats an average of 1-2 meals per day without any snacks. Frequently goes longer than 8 hours without eating. Reports these episodes of low blood sugars usually prompt her to eat.   Rash: Has been present on her feet for about 6-8 weeks. Very itchy. Was originally told she had bed bugs but an exterminator came to her house and could not find any. Her daughters were just seen at Pediatric ED and one was diagnosed with scabies and the other was diagnosed with scabies.   -  reports that she quit smoking about 12 years ago. Her smoking use included Cigarettes. She has a 1.00 pack-year smoking history. She has never used smokeless tobacco. - Review of Systems: Per HPI. - Past Medical History: Patient Active Problem List   Diagnosis Date Noted  . Rash and nonspecific skin eruption 11/13/2016  . Diabetes ([place]) 06/26/2016  . Neuropathic pain of both feet 05/09/2016  . Headache 05/09/2016  . 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 Preventative health care 12/09/2014  . Seborrheic dermatitis 12/05/2013  . Anxiety 10/25/2011  . Bipolar 1 disorder, mixed ([place]) 09/15/2011  . Insomnia 10/21/2010  . Allergic rhinitis 08/31/2008  . Esophageal reflux 01/24/2008  . HYPERTENSION, BENIGN ESSENTIAL 08/17/2006  .  Sinusitis, chronic 08/17/2006  . Morbid obesity with BMI of 50.0-59.9, adult ([place]) 08/09/2006  . DEPRESSION, MAJOR, RECURRENT 08/09/2006   - Medications: reviewed and updated   Objective:   Physical Exam BP 126/82   Pulse 100   Temp 98.6 F (37 C) (Oral)   Ht 6' 3"  (1.905 m)   Wt (!) 453 lb 12.8 oz (205.8 kg)   SpO2 98%   BMI 56.72 kg/m  Gen: NAD, alert, cooperative with exam, well-appearing Skin: Maryann Alar present in the web spaces of toes bilaterally. Linear excoriations present on the top surfaces of feet bilaterally.      Assessment & Plan:   1. Type 2 diabetes mellitus without complication, without long-term current use of insulin (HCC) A1c well controlled today at 6.2%.  - HgB A1c - Glucose (CBG) - Blood Glucose Monitoring Suppl (ACCU-CHEK AVIVA CONNECT) w/Device KIT; 1 kit by Does not apply route daily.  Dispense: 1 kit; Refill: 0 - glucose blood (ACCU-CHEK AVIVA PLUS) test strip; Use as instructed  Dispense: 100 each; Refill: 12 - Lancet Devices (ACCU-CHEK SOFTCLIX) lancets; Use as instructed  Dispense: 1 each; Refill: 0 - Lancets (ACCU-CHEK MULTICLIX) lancets; Check sugar daily  Dispense: 200 each; Refill: 3  2. Scabies Rash appears consistent with scabies especially given known +contact. Doubt folliculitis as does not have small pustules and not in an area of the body with a lot of hair present.  - permethrin (ELIMITE) 5 % cream; Apply 1 application topically once.  Dispense: 60  g; Refill: 0 -instructions regarding cleaning home reviewed   3. Hypoglycemia CBG 130 today. Unknown etiology but may be related to very erradic meal schedule. CBG is stable today. May be false readings from meter as we can not compare to our meter at clinic today; however patient is reporting symptoms that seem consistent with hypoglycemia. Was originally taking Metformin ER 500 mg BID. Discussed that this was inappropriate dosing of the medication. Metformin should not cause hypoglycemia  however decided to discontinue completely given history of well controlled A1c. Advised patient to check CBGs with new meter prescribed today and to eat more consistent small meals/snacks. If hypoglycemia persists, return with meter for comparison to ours. If truly hypoglycemic, would need further work up.    Phill Myron, D.O. 02/16/2017, 9:52 AM PGY-3, [place]

## 2017-02-16 NOTE — Progress Notes (Signed)
poct

## 2017-02-16 NOTE — Telephone Encounter (Signed)
Pt states the glucometer that was called in isn't covered by Medicaid. Please call in another one to [facility] on [address]. Also the form completed for handicapped placard wasn't filled out correctly. Can pt get another one?

## 2017-02-16 NOTE — Patient Instructions (Addendum)
Stop taking your Metformin. I have ordered your diabetic supplies. If you continue to have low blood sugars please return with your meter. Try to eat 3-5 small meals per day.    Permethrin: Topical: Cream 5%: Thoroughly massage cream (30 g for average adult) from head to soles of feet; leave on for 8 to 14 hours before removing (shower or bath)   Scabies, Adult Scabies is a skin condition that happens when very small insects get under the skin (infestation). This causes a rash and severe itchiness. Scabies can spread from person to person (is contagious). If you get scabies, it is common for others in your household to get scabies too. With proper treatment, symptoms usually go away in 2-4 weeks. Scabies usually does not cause lasting problems. What are the causes? This condition is caused by mites (Sarcoptes scabiei, or human itch mites) that can only be seen with a microscope. The mites get into the top layer of skin and lay eggs. Scabies can spread from person to person through:  Close contact with a person who has scabies.  Contact with infested items, such as towels, bedding, or clothing.  What increases the risk? This condition is more likely to develop in:  People who live in nursing homes and other extended-care facilities.  People who have sexual contact with a partner who has scabies.  Young children who attend child care facilities.  People who care for others who are at increased risk for scabies.  What are the signs or symptoms? Symptoms of this condition may include:  Severe itchiness. This is often worse at night.  A rash that includes tiny red bumps or blisters. The rash commonly occurs on the wrist, elbow, armpit, fingers, waist, groin, or buttocks. Bumps may form a line (burrow) in some areas.  Skin irritation. This can include scaly patches or sores.  How is this diagnosed? This condition is diagnosed with a physical exam. Your health care provider will look  closely at your skin. In some cases, your health care provider may take a sample of your affected skin (skin scraping) and have it examined under a microscope. How is this treated? This condition may be treated with:  Medicated cream or lotion that kills the mites. This is spread on the entire body and left on for several hours. Usually, one treatment with medicated cream or lotion is enough to kill all of the mites. In severe cases, the treatment may be repeated.  Medicated cream that relieves itching.  Medicines that help to relieve itching.  Medicines that kill the mites. This treatment is rarely used.  Follow these instructions at home:  Medicines  Take or apply over-the-counter and prescription medicines as told by your health care provider.  Apply medicated cream or lotion as told by your health care provider.  Do not wash off the medicated cream or lotion until the necessary amount of time has passed. Skin Care  Avoid scratching your affected skin.  Keep your fingernails closely trimmed to reduce injury from scratching.  Take cool baths or apply cool washcloths to help reduce itching. General instructions  Clean all items that you recently had contact with, including bedding, clothing, and furniture. Do this on the same day that your treatment starts. ? Use hot water when you wash items. ? Place unwashable items into closed, airtight plastic bags for at least 3 days. The mites cannot live for more than 3 days away from human skin. ? Vacuum furniture and mattresses that  you use.  Make sure that other people who may have been infested are examined by a health care provider. These include members of your household and anyone who may have had contact with infested items.  Keep all follow-up visits as told by your health care provider. This is important. Contact a health care provider if:  You have itching that does not go away after 4 weeks of treatment.  You continue to  develop new bumps or burrows.  You have redness, swelling, or pain in your rash area after treatment.  You have fluid, blood, or pus coming from your rash. This information is not intended to replace advice given to you by your health care provider. Make sure you discuss any questions you have with your health care provider. Document Released: 02/17/2015 Document Revised: 11/04/2015 Document Reviewed: 12/29/2014 Elsevier Interactive Patient Education  [employer]2018 [employer] Inc.

## 2017-02-20 NOTE — Telephone Encounter (Signed)
Patient called to check on when a new glucometer will be called in that Medicaid covers and when can she pick up her handicapped sticker. Please call the patient.

## 2017-02-21 NOTE — Telephone Encounter (Signed)
Pt called again about handicapped placard.  She got her glucometer but had to pay for it. She needs a Rx for a Medicaid meter.  She also wants to know what medicine replaced her metformin.please advise

## 2017-02-22 ENCOUNTER — Ambulatory Visit: Payer: Self-pay | Admitting: Student

## 2017-03-09 ENCOUNTER — Other Ambulatory Visit: Payer: Self-pay | Admitting: *Deleted

## 2017-03-09 MED ORDER — ATORVASTATIN CALCIUM 20 MG PO TABS: 20.0000 mg | ORAL_TABLET | Freq: Every day | ORAL | 0 refills | Status: DC

## 2017-03-21 ENCOUNTER — Other Ambulatory Visit: Payer: Self-pay | Admitting: Internal Medicine

## 2017-03-21 DIAGNOSIS — E119 Type 2 diabetes mellitus without complications: Secondary | ICD-10-CM

## 2017-03-28 ENCOUNTER — Ambulatory Visit: Payer: Medicaid Other | Admitting: Internal Medicine

## 2017-04-05 ENCOUNTER — Ambulatory Visit: Payer: Medicaid Other | Admitting: Internal Medicine

## 2017-04-09 ENCOUNTER — Emergency Department (HOSPITAL_COMMUNITY): Payer: Medicaid Other

## 2017-04-09 ENCOUNTER — Encounter (HOSPITAL_COMMUNITY): Payer: Self-pay | Admitting: Nurse Practitioner

## 2017-04-09 ENCOUNTER — Emergency Department (HOSPITAL_COMMUNITY)
Admission: EM | Admit: 2017-04-09 | Discharge: 2017-04-09 | Disposition: A | Discharge status: Home / Self Care | Payer: Medicaid Other | Attending: Emergency Medicine | Admitting: Emergency Medicine

## 2017-04-09 DIAGNOSIS — Z79899 Other long term (current) drug therapy: Secondary | ICD-10-CM | POA: Insufficient documentation

## 2017-04-09 DIAGNOSIS — I10 Essential (primary) hypertension: Secondary | ICD-10-CM | POA: Insufficient documentation

## 2017-04-09 DIAGNOSIS — M791 Myalgia, unspecified site: Secondary | ICD-10-CM | POA: Diagnosis present

## 2017-04-09 DIAGNOSIS — Z87891 Personal history of nicotine dependence: Secondary | ICD-10-CM | POA: Insufficient documentation

## 2017-04-09 DIAGNOSIS — R52 Pain, unspecified: Secondary | ICD-10-CM

## 2017-04-09 LAB — I-STAT CHEM 8, ED
BUN: 10 mg/dL (ref 6–20)
CALCIUM ION: 1.02 mmol/L — AB (ref 1.15–1.40)
CHLORIDE: 100 mmol/L — AB (ref 101–111)
CREATININE: 0.8 mg/dL (ref 0.44–1.00)
GLUCOSE: 131 mg/dL — AB (ref 65–99)
HCT: 37 % (ref 36.0–46.0)
HEMOGLOBIN: 12.6 g/dL (ref 12.0–15.0)
POTASSIUM: 3.5 mmol/L (ref 3.5–5.1)
Sodium: 138 mmol/L (ref 135–145)
TCO2: 26 mmol/L (ref 22–32)

## 2017-04-09 NOTE — ED Triage Notes (Signed)
Pt states for about a week, she has been having aches in legs arms and chest. Tonight the pain worsened to an extent of not being able to sleep. She denies, fever/chills, N/V/D.

## 2017-04-09 NOTE — Discharge Instructions (Signed)
Your blood work today was reassuring.  We believe that your muscle cramping may be due to the fact that you were recently started on Lipitor.  We recommend close follow-up with your primary care doctor to discuss your symptoms and possibly changing this medication.  Continue to remain well hydrated by drinking plenty of water daily.  You may return to the emergency department, as needed, for new or concerning symptoms.

## 2017-04-09 NOTE — ED Provider Notes (Signed)
[facility] DEPT Provider Note   CSN: [id number] Arrival date & time: 04/09/17  0041     History   Chief Complaint Chief Complaint  Patient presents with  . Generalized Body Aches    HPI Karen Brennan is a 41 y.o. female.  41 year old female with a history of anxiety, bipolar 1 disorder, depression, hypertension, and diabetes presents to the emergency department for evaluation of cramping.  She states that she began noticing some cramping in her right leg 1.5 weeks ago.  She states that cramping will last up to an hour before spontaneously resolving.  She denies any known modifying factors of her symptoms.  It is not brought on by exertion or overuse.  She began to notice cramping in her right arm 1 week ago as well.  She has since developed cramping to her right chest and upper back at times.  Again, patient has not noted any modifying factors of this pain.  She has tried extra strength Tylenol and ibuprofen without relief.  No associated fevers or trauma.  She does take Lipitor daily which was started 3 months ago.  She has tried to remain well hydrated by drinking plenty of fluids.   The history is provided by the patient. No language interpreter was used.    Past Medical History:  Diagnosis Date  . ANEMIA 08/14/2008   Qualifier: Diagnosis of  By: Genene Churn MD, Janett Billow    . Anxiety   . Bipolar 1 disorder ([place])   . Blood transfusion   . Depression   . GERD (gastroesophageal reflux disease)   . History of bronchitis   . Hypertension   . Kidney stones 10/09/11  . Migraines 10/09/11   "often"    Patient Active Problem List   Diagnosis Date Noted  . Rash and nonspecific skin eruption 11/13/2016  . Diabetes ([place]) 06/26/2016  . Neuropathic pain of both feet 05/09/2016  . Headache 05/09/2016  . 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 Preventative health care 12/09/2014  . Seborrheic dermatitis 12/05/2013  . Anxiety  10/25/2011  . Bipolar 1 disorder, mixed ([place]) 09/15/2011  . Insomnia 10/21/2010  . Allergic rhinitis 08/31/2008  . Esophageal reflux 01/24/2008  . HYPERTENSION, BENIGN ESSENTIAL 08/17/2006  . Sinusitis, chronic 08/17/2006  . Morbid obesity with BMI of 50.0-59.9, adult ([place]) 08/09/2006  . DEPRESSION, MAJOR, RECURRENT 08/09/2006    Past Surgical History:  Procedure Laterality Date  . ANKLE FRACTURE SURGERY   1990's   "left; put screws in"  . ENDOMETRIAL ABLATION  ~ 2011  . FRACTURE SURGERY    . GASTRIC BY PASS    . TUBAL LIGATION  2006    OB History    No data available       Home Medications    Prior to Admission medications   Medication Sig Start Date End Date Taking? Authorizing Provider  ACCU-CHEK FASTCLIX LANCETS MISC Check blood sugar once daily, alternating before breakfast and an hour after largest meal of day. 11/30/16   Rumley, [place] N, DO  atorvastatin (LIPITOR) 20 MG tablet Take 1 tablet (20 mg total) by mouth daily. 03/09/17   Rogue Bussing, MD  Blood Glucose Monitoring Suppl (ACCU-CHEK AVIVA PLUS) w/Device KIT USE AS DIRECTED*NOT COVERED* 03/23/17   Nicolette Bang, DO  busPIRone (BUSPAR) 15 MG tablet Take 15 mg by mouth 3 (three) times daily. 04/09/14   [provider]  CALCIUM PO Take 1 tablet by mouth 3 (three) times daily.  [provider]  citalopram (CELEXA) 40 MG tablet Take 40 mg by mouth daily.    [provider]  doxycycline (VIBRA-TABS) 100 MG tablet Take 1 tablet (100 mg total) by mouth 2 (two) times daily. 11/30/16   Rumley, [place] N, DO  Fluocinolone Acetonide (DERMA-SMOOTHE/FS SCALP) 0.01 % OIL APPLY NO MORE THAN 1 OZ TO SCALP ONCE DAILY, LEAVE ON SCALP FOR 5 MINS, THEN RINSE Patient not taking: Reported on 06/23/2016 03/26/15   Rumley, Burna Cash, DO  fluticasone (FLONASE) 50 MCG/ACT nasal spray Place 2 sprays into both nostrils daily. 05/23/16   Janora Norlander, DO  gabapentin (NEURONTIN) 100 MG  capsule Take 1 capsule (100 mg total) by mouth 3 (three) times daily. 08/21/16   Rumley, Burna Cash, DO  glucose blood (ACCU-CHEK AVIVA PLUS) test strip Use as instructed 02/16/17   Nicolette Bang, DO  glucose blood (ACCU-CHEK GUIDE) test strip Use as instructed 11/30/16   Lorna Few, DO  hydrochlorothiazide (HYDRODIURIL) 25 MG tablet Take 1 tablet (25 mg total) by mouth daily. 08/21/16   Rumley, Burna Cash, DO  HYDROcodone-acetaminophen (NORCO) 5-325 MG tablet Take 1 tablet by mouth every 6 (six) hours as needed for moderate pain. 07/21/16   Kirichenko, Lahoma Rocker, PA-C  hydrOXYzine (ATARAX/VISTARIL) 25 MG tablet Take 50 mg by mouth 2 (two) times daily.    [provider]  ibuprofen (ADVIL,MOTRIN) 600 MG tablet Take 1 tablet (600 mg total) by mouth every 6 (six) hours as needed. 07/21/16   Jeannett Senior, PA-C  Lancet Devices ([facility]) lancets Use as instructed 02/16/17   Nicolette Bang, DO  Lancets (ACCU-CHEK MULTICLIX) lancets Check sugar daily 02/16/17   Nicolette Bang, DO  loratadine (CLARITIN) 10 MG tablet TAKE 1 TABLET (10 MG TOTAL) BY MOUTH DAILY. 05/09/16   Mikell, Jeani Sow, MD  methocarbamol (ROBAXIN) 750 MG tablet Take 1 tablet (750 mg total) by mouth every 6 (six) hours as needed for muscle spasms. 07/21/16   Kirichenko, Lahoma Rocker, PA-C  Multiple Vitamin (MULTIVITAMIN WITH MINERALS) TABS Take 1 tablet by mouth 3 (three) times daily.    [provider]  naproxen (NAPROSYN) 500 MG tablet Take 1 tablet (500 mg total) by mouth 2 (two) times daily as needed for headache. 07/05/16   Ronnie Doss M, DO  OLANZapine (ZYPREXA) 20 MG tablet Take 20 mg by mouth at bedtime.    [provider]  omeprazole (PRILOSEC) 20 MG capsule Take 1 capsule (20 mg total) by mouth daily. 07/18/16   Rumley, Burna Cash, DO  polyethylene glycol (MIRALAX / GLYCOLAX) packet Take 17 g by mouth daily as needed for mild constipation. 07/18/16   Rumley, Burna Cash, DO  SUMAtriptan (IMITREX) 25 MG tablet Take 1 tablet (25 mg total) by mouth once. May repeat ONE TIME in 2 hours if headache persists or recurs. 05/23/16 05/23/16  Janora Norlander, DO    Family History History reviewed. No pertinent family history.  Social History Social History  Substance Use Topics  . Smoking status: Former Smoker    Packs/day: 0.25    Years: 4.00    Types: Cigarettes    Quit date: 06/12/2004  . Smokeless tobacco: Never Used  . Alcohol use No     Allergies   Sibutramine hcl monohydrate   Review of Systems Review of Systems Ten systems reviewed and are negative for acute change, except as noted in the HPI.    Physical Exam Updated Vital Signs BP 121/81 (BP Location: Left  Arm)   Pulse 85   Temp 98.1 F (36.7 C) (Oral)   Resp 17   SpO2 99%   Physical Exam  Constitutional: She is oriented to person, place, and time. She appears well-developed and well-nourished. No distress.  Nontoxic and in NAD  HENT:  Head: Normocephalic and atraumatic.  Eyes: Conjunctivae and EOM are normal. No scleral icterus.  Neck: Normal range of motion.  Cardiovascular: Normal rate, regular rhythm and intact distal pulses.   Pulmonary/Chest: Effort normal. No respiratory distress. She has no wheezes. She has no rales.  Respirations even and unlabored  Musculoskeletal: Normal range of motion.  Neurological: She is alert and oriented to person, place, and time. She exhibits normal muscle tone. Coordination normal.  GCS 15. Patient moving all extremities.  Skin: Skin is warm and dry. No rash noted. She is not diaphoretic. No erythema. No pallor.  Psychiatric: She has a normal mood and affect. Her behavior is normal.  Nursing note and vitals reviewed.    ED Treatments / Results  Labs (all labs ordered are listed, but only abnormal results are displayed) Labs Reviewed  I-STAT CHEM 8, ED - Abnormal; Notable for the following:       Result Value   Chloride 100 (*)      Glucose, Bld 131 (*)    Calcium, Ion 1.02 (*)    All other components within normal limits    EKG  EKG Interpretation  Date/Time:  Monday April 09 2017 04:21:21 EDT Ventricular Rate:  82 PR Interval:    QRS Duration: 94 QT Interval:  385 QTC Calculation: 450 R Axis:   48 Text Interpretation:  Sinus rhythm Multiform ventricular premature complexes Borderline repolarization abnormality No significant change since last tracing Confirmed by Pryor Curia [id number]) on 04/09/2017 4:43:40 AM       Radiology Dg Chest 2 View  Result Date: 04/09/2017 CLINICAL DATA:  Chest pain. EXAM: CHEST  2 VIEW COMPARISON:  Radiograph 05/20/2014 FINDINGS: The cardiomediastinal contours are normal. The lungs are clear. Pulmonary vasculature is normal. No consolidation, pleural effusion, or pneumothorax. No acute osseous abnormalities are seen. IMPRESSION: No acute pulmonary process. Electronically Signed   By: Jeb Levering M.D.   On: 04/09/2017 04:23    Procedures Procedures (including critical care time)  Medications Ordered in ED Medications - No data to display   Initial Impression / Assessment and Plan / ED Course  I have reviewed the triage vital signs and the nursing notes.  Pertinent labs & imaging results that were available during my care of the patient were reviewed by me and considered in my medical decision making (see chart for details).     41 year old female presents to the emergency department for muscle cramping.  Symptoms began 1.5 weeks ago and have been gradually worsening.  She denies any trauma or injury as well as any overuse inciting symptoms.  Cramping does not appear to be secondary to electrolyte derangements.  Chest x-ray was performed given chest cramping which is negative for acute cardiopulmonary abnormality.  EKG unchanged from prior tracing.  Low suspicion for emergent cause of symptoms.  It is possible that the patient's cramping may be secondary to her use  of atorvastatin.  She states that she was only placed on this medication 3 months ago.  I have continued to encourage oral hydration and advised the patient to speak with her doctor about these symptoms as she may benefit from changing her cholesterol medication. Return precautions discussed and provided. Patient  discharged in stable condition with no unaddressed concerns.   Final Clinical Impressions(s) / ED Diagnoses   Final diagnoses:  Body aches    New Prescriptions Discharge Medication List as of 04/09/2017  5:00 AM       Antonietta Breach, PA-C 04/09/17 0616    Ward, Delice Bison, DO 04/09/17 [phone]

## 2017-04-09 NOTE — ED Notes (Signed)
Patient transported to X-ray 

## 2017-04-11 ENCOUNTER — Ambulatory Visit: Payer: Medicaid Other | Admitting: Internal Medicine

## 2017-04-13 ENCOUNTER — Ambulatory Visit (INDEPENDENT_AMBULATORY_CARE_PROVIDER_SITE_OTHER): Payer: Medicaid Other | Admitting: Internal Medicine

## 2017-04-13 ENCOUNTER — Encounter: Payer: Self-pay | Admitting: Internal Medicine

## 2017-04-13 VITALS — BP 132/82 | HR 101 | Temp 97.9°F | Ht 75.0 in | Wt >= 6400 oz

## 2017-04-13 DIAGNOSIS — M791 Myalgia, unspecified site: Secondary | ICD-10-CM

## 2017-04-13 DIAGNOSIS — G629 Polyneuropathy, unspecified: Secondary | ICD-10-CM

## 2017-04-13 DIAGNOSIS — G6289 Other specified polyneuropathies: Secondary | ICD-10-CM | POA: Diagnosis present

## 2017-04-13 DIAGNOSIS — I10 Essential (primary) hypertension: Secondary | ICD-10-CM

## 2017-04-13 DIAGNOSIS — G5793 Unspecified mononeuropathy of bilateral lower limbs: Secondary | ICD-10-CM

## 2017-04-13 MED ORDER — GABAPENTIN 300 MG PO CAPS: 300.0000 mg | ORAL_CAPSULE | Freq: Three times a day (TID) | ORAL | 3 refills | Status: DC

## 2017-04-13 NOTE — Progress Notes (Signed)
Subjective:    Karen Brennan - 41 y.o. female MRN [id number]  Date of birth: [birth year]  HPI  Karen Brennan is here to discuss multiple concerns.  HTN: Patient concerned about elevated BP at dentist recently. Reports was 150/100 on repeat measurements. BP taken with wrist cuff. Reports compliance with HCTZ. Does not monitor BP at home but does have a cuff. No chest pain, severe headaches, or vision changes.   Peripheral Neuropathy: Bilateral burning sensation in soles of feet for >1 year. Endorses numbness of toes and bottom of feet as well. Numbness does not extend beyond the feet. No new medications. No lower back pain or saddle anesthesia. No weakness of lower extremities. No bowel or bladder incontinence. No history of bariatric surgery. Denies Etoh use. Does have history of T2DM well controlled.   Myalgias: Reports body aches and muscle cramping in her arms, upper back, and legs. Has been occurring for about 2 weeks. Cramping will last for about 1 hour on average prior to spontaneous resolution. Patient does take Lipitor which was started about 3 months ago. She reports good PO fluid intake and UOP. No blood in urine. No associated fevers or night sweats. Has tried Tylenol and Ibuprofen without relief.    -  reports that she quit smoking about 12 years ago. Her smoking use included Cigarettes. She has a 1.00 pack-year smoking history. She has never used smokeless tobacco. - Review of Systems: Per HPI. - Past Medical History: Patient Active Problem List   Diagnosis Date Noted  . Rash and nonspecific skin eruption 11/13/2016  . Diabetes ([place]) 06/26/2016  . Neuropathic pain of both feet 05/09/2016  . Headache 05/09/2016  . 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 Preventative health care 12/09/2014  . Seborrheic dermatitis 12/05/2013  . Anxiety 10/25/2011  . Bipolar 1 disorder, mixed ([place]) 09/15/2011  . Insomnia 10/21/2010  . Allergic rhinitis 08/31/2008    . Esophageal reflux 01/24/2008  . HYPERTENSION, BENIGN ESSENTIAL 08/17/2006  . Sinusitis, chronic 08/17/2006  . Morbid obesity with BMI of 50.0-59.9, adult ([place]) 08/09/2006  . DEPRESSION, MAJOR, RECURRENT 08/09/2006   - Medications: reviewed and updated   Objective:   Physical Exam BP 132/82   Pulse (!) 101   Temp 97.9 F (36.6 C) (Oral)   Ht _0  (1.905 m)   Wt (!) 459 lb (208.2 kg)   SpO2 98%   BMI 57.37 kg/m  Gen: NAD, alert, cooperative with exam, well-appearing HEENT: NCAT, PERRL, clear conjunctiva, oropharynx clear, supple neck CV: RRR, good S1/S2, no murmur, no edema, capillary refill brisk  Resp: CTABL, no wheezes, non-labored Abd: SNTND, BS present, no guarding or organomegaly Skin: no rashes, normal turgor  MSK: Full ROM at all extremities.  Neuro: Significantly decreased sensation to fine touch on soles of feet bilaterally. Strength 5/5 in all extremities. Sensation to lower extremities otherwise grossly intact.  Psych: good insight, alert and oriented     Assessment & Plan:   1. Neuropathic pain of both feet No associated motor involvement. No other neurological findings. Benign neurological exam aside from decreased sensation in soles of feet. Patient does have a history of T2DM. May be related to diabetic neuropathy. No new medications, no ETOH use, no hx of diabetes, no hx of kidney disease, no lower back pain, no hx of bariatric surgery. Diff to consider diabetes, thyroid issues, tactile hallucinations (history of bipolar disorder), vitamin deficiency B12/Folate, multiple myeloma, . Unlikely paraneoplastic given patient's age, no issues  of fevers or sudden weight loss. Will check lab work. Have given patient instructions on how to increase Gabapentin from 100 mg TID to 300 mg TID.  - TSH - Vitamin B12 - Folate - CBC - Comprehensive metabolic panel - gabapentin (NEURONTIN) 300 MG capsule; Take 1 capsule (300 mg total) by mouth 3 (three) times daily.  Dispense:  180 capsule; Refill: 3  2. Myalgia Concerned for statin induced myalgias given time course. Patient reports adequate hydration and no signs of dehydration on exam. Recent electrolytes obtained at ED all within normal limits so lower suspicion for electrolyte derangement as source. Will obtain repeat basic lab work as well as CK. Have recommended patient discontinue Lipitor and will follow up myalgias after discontinuation of medication. Discussed adequate fluid hydration.  - CK - CBC - Comprehensive metabolic panel  3. Hypertension Patient's BP controlled today and without red flags on exam. Was reportedly elevated at dentists office but may have been less accurate given use of wrist cuff for measurement. Patient has had minimally hypertensive BPs in the recent past, otherwise controlled. Have asked to record BPs at home for the next 4 weeks and return with log. If elevated from home readings, would add additional anti-hypertensive agent.    Phill Myron, D.O. 04/14/2017, 12:21 PM PGY-3, [facility]

## 2017-04-13 NOTE — Patient Instructions (Signed)
Stop taking your Metformin to see if that helps with your blood sugars.   Please monitor your blood pressure at home a couple times per week and record this number. Return in 1 month with your blood pressure log.   We will check some labs today. Stop taking your Lipitor as this may be causing your body aches.   To increase your Gabapentin start taking 3 tablets at your nighttime dose. Do this for a few days then start taking 3 tablets in the morning. Do this for a few days then start taking 3 tablets in the the afternoon. This will eventually get you up to 300 mg three times per day (900 mg total).

## 2017-04-14 LAB — CBC
HEMATOCRIT: 35.6 % (ref 34.0–46.6)
HEMOGLOBIN: 11.8 g/dL (ref 11.1–15.9)
MCH: 27.1 pg (ref 26.6–33.0)
MCHC: 33.1 g/dL (ref 31.5–35.7)
MCV: 82 fL (ref 79–97)
Platelets: 373 10*3/uL (ref 150–379)
RBC: 4.35 x10E6/uL (ref 3.77–5.28)
RDW: 14.4 % (ref 12.3–15.4)
WBC: 7.6 10*3/uL (ref 3.4–10.8)

## 2017-04-14 LAB — COMPREHENSIVE METABOLIC PANEL
ALBUMIN: 4.1 g/dL (ref 3.5–5.5)
ALT: 19 IU/L (ref 0–32)
AST: 21 IU/L (ref 0–40)
Albumin/Globulin Ratio: 1.4 (ref 1.2–2.2)
Alkaline Phosphatase: 69 IU/L (ref 39–117)
BUN / CREAT RATIO: 14 (ref 9–23)
BUN: 11 mg/dL (ref 6–20)
Bilirubin Total: 0.4 mg/dL (ref 0.0–1.2)
CALCIUM: 9.7 mg/dL (ref 8.7–10.2)
CO2: 25 mmol/L (ref 20–29)
CREATININE: 0.79 mg/dL (ref 0.57–1.00)
Chloride: 101 mmol/L (ref 96–106)
GFR calc Af Amer: 109 mL/min/{1.73_m2} (ref >59)
GFR, EST NON AFRICAN AMERICAN: 95 mL/min/{1.73_m2} (ref >59)
GLOBULIN, TOTAL: 3 g/dL (ref 1.5–4.5)
Glucose: 92 mg/dL (ref 65–99)
Potassium: 4.4 mmol/L (ref 3.5–5.2)
SODIUM: 141 mmol/L (ref 134–144)
Total Protein: 7.1 g/dL (ref 6.0–8.5)

## 2017-04-14 LAB — VITAMIN B12: Vitamin B-12: 213 pg/mL — ABNORMAL LOW (ref 232–1245)

## 2017-04-14 LAB — CK: CK TOTAL: 184 U/L — AB (ref 24–173)

## 2017-04-14 LAB — TSH: TSH: 1.04 u[IU]/mL (ref 0.450–4.500)

## 2017-04-14 LAB — FOLATE: Folate: 8.5 ng/mL (ref >3.0)

## 2017-05-07 ENCOUNTER — Other Ambulatory Visit: Payer: Self-pay | Admitting: Internal Medicine

## 2017-05-07 DIAGNOSIS — E119 Type 2 diabetes mellitus without complications: Secondary | ICD-10-CM

## 2017-05-07 MED ORDER — GLUCOSE BLOOD VI STRP: ORAL_STRIP | 12 refills | Status: DC

## 2017-05-07 NOTE — Progress Notes (Signed)
New prescription sent

## 2017-05-07 NOTE — Progress Notes (Signed)
Morrie Sheldonshley with [facility] called and states that test strips need to be written with specific directions.  Please resend script again or they can be reached at [phone]. Jazmin Hartsell,CMA

## 2017-05-12 IMAGING — US US THYROID BIOPSY
1 series · 7 of 7 positions shown · non-contrast
Comparison: Thyroid ultrasound performed 09/19/2016

MEDICATIONS:
None

COMPLICATIONS:
None immediate.

INDICATION: Indeterminate left thyroid nodule

EXAM:
ULTRASOUND GUIDED FINE NEEDLE ASPIRATION OF INDETERMINATE LEFT
THYROID NODULE
TECHNIQUE: Informed written consent was obtained from the patient after a
discussion of the risks, benefits and alternatives to treatment.
Questions regarding the procedure were encouraged and answered. A
timeout was performed prior to the initiation of the procedure.

[Series 1: us thyroid biopsy · 0.06mm/px · 7 acquisitions, 7 frames shown]
[im 1/7]
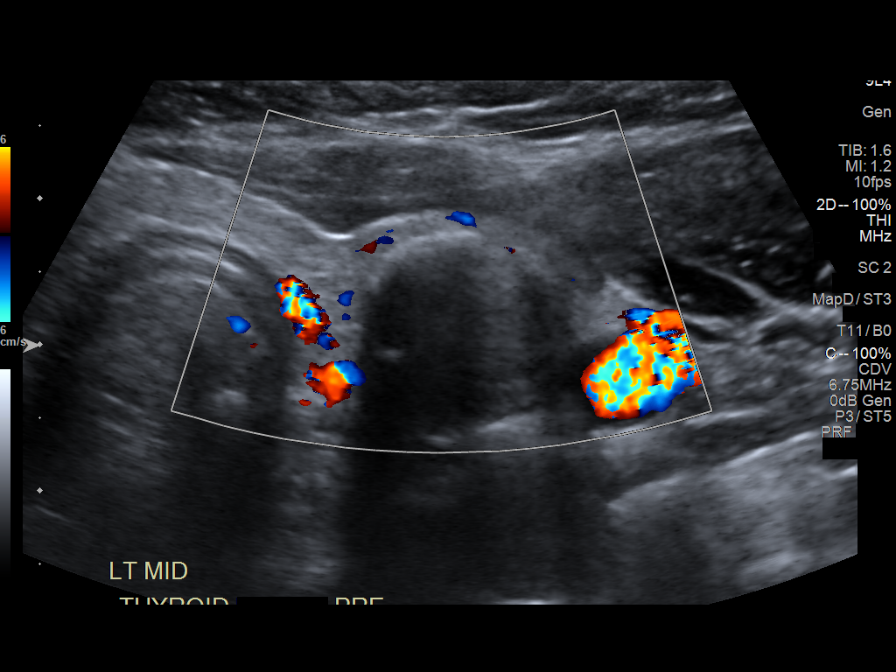
[im 2/7]
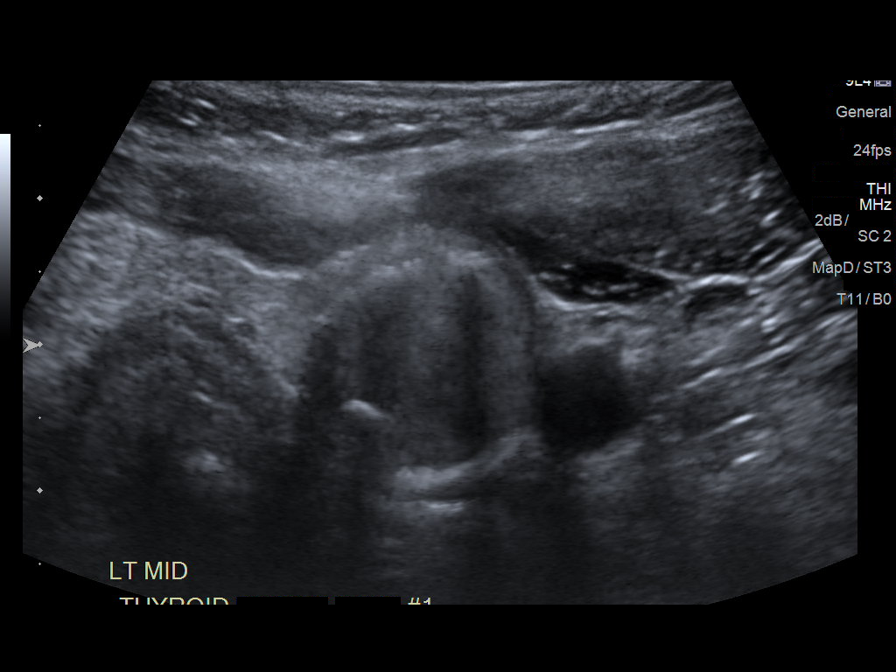
[im 3/7]
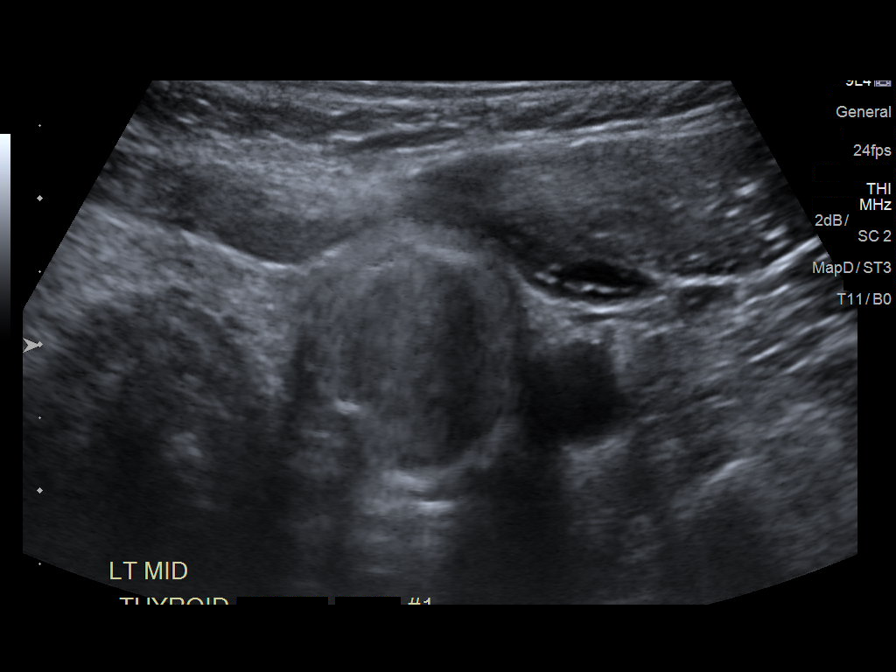
[im 4/7]
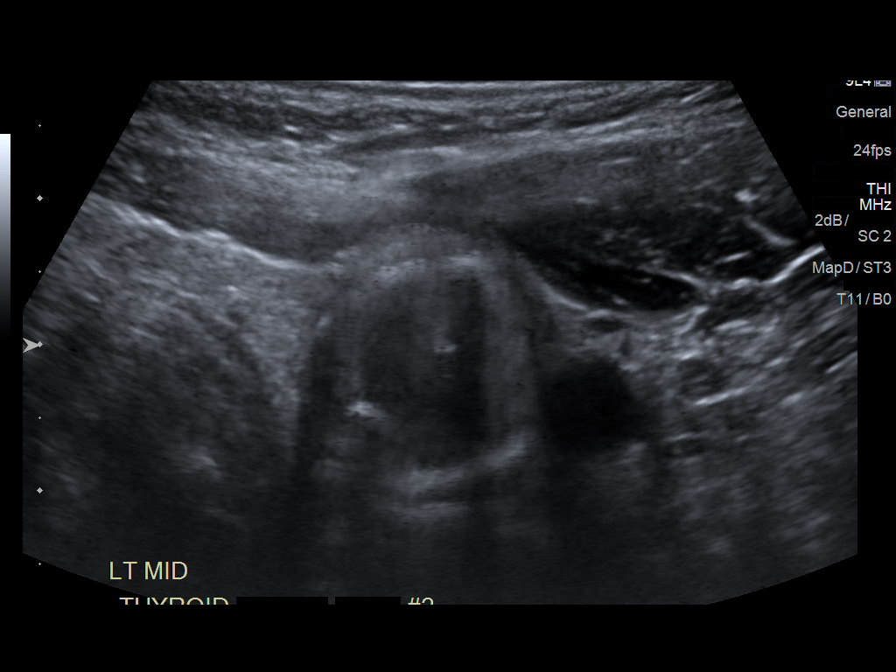
[im 5/7]
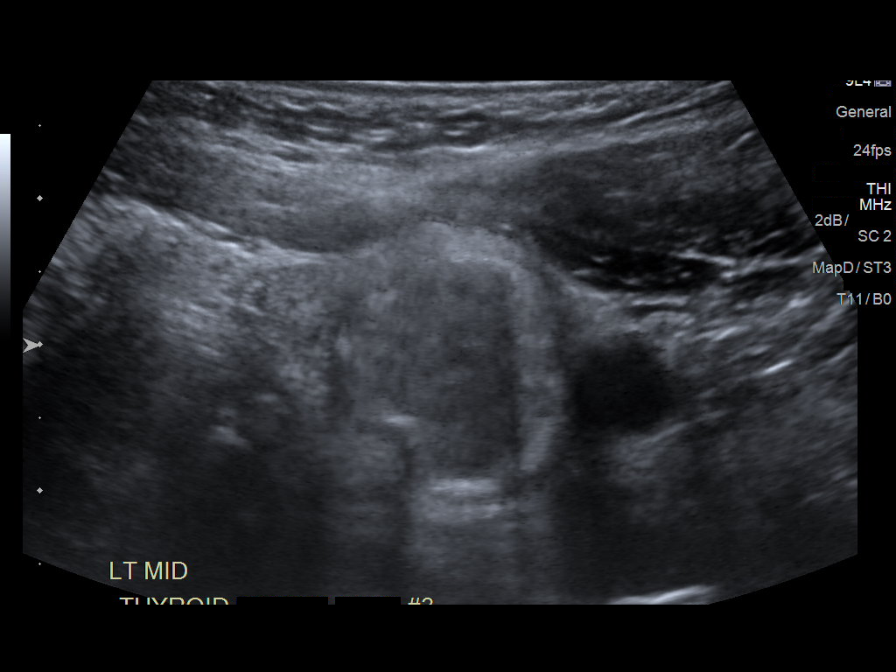
[im 6/7]
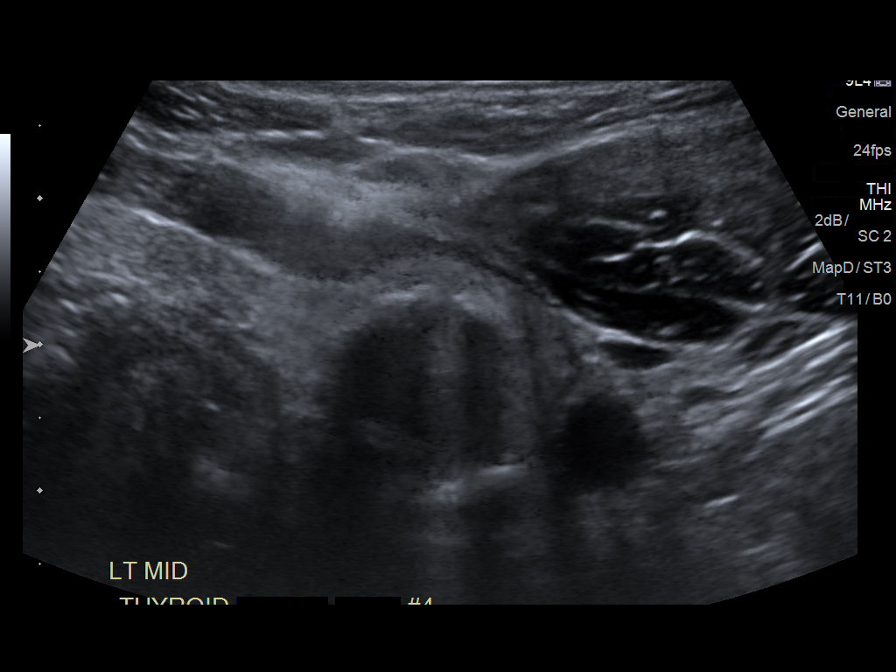
[im 7/7]
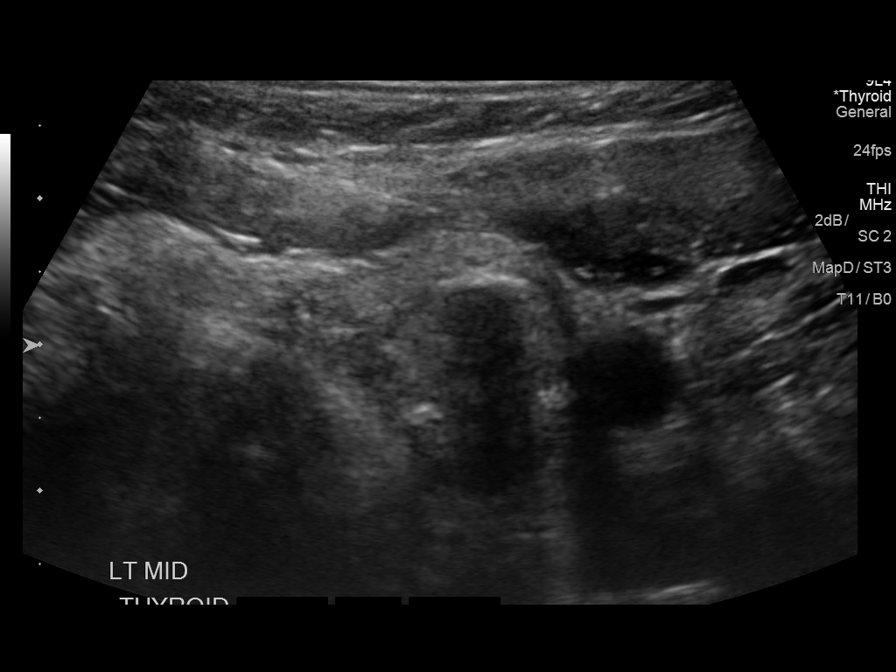

[7 of 7 positions shown; findings below may reference images not displayed]

Pre-procedural ultrasound scanning demonstrated unchanged size and
appearance of the indeterminate nodule within the left mid thyroid.

The procedure was planned. The neck was prepped in the usual sterile
fashion, and a sterile drape was applied covering the operative
field. A timeout was performed prior to the initiation of the
procedure. Local anesthesia was provided with 1% lidocaine.

Under direct ultrasound guidance, 4 FNA biopsies were performed of
the left mid thyroid nodule with a 25 gauge needle. Multiple
ultrasound images were saved for procedural documentation purposes.
The samples were prepared and submitted to pathology.

Limited post procedural scanning was negative for hematoma or
additional complication. Dressings were placed. The patient
tolerated the above procedures procedure well without immediate
postprocedural complication.
FINDINGS: FINDINGS
Nodule reference number based on prior diagnostic ultrasound: 2

Maximum size:  1.8 cm

Location: Left  ;  Mid

ACR TI-RADS total points: 5

ACR TI-RADS risk category:  TR4 (4-6 points)

Prior biopsy:  No

Reason for biopsy: meets ACR TI-RADS criteria

Ultrasound imaging confirms appropriate placement of the needles
within the thyroid nodule.
IMPRESSION: Technically successful ultrasound guided fine needle aspiration of
left mid thyroid nodule as described above.

## 2017-07-18 ENCOUNTER — Emergency Department (HOSPITAL_COMMUNITY): Payer: Medicaid Other

## 2017-07-18 ENCOUNTER — Encounter (HOSPITAL_COMMUNITY): Payer: Self-pay | Admitting: *Deleted

## 2017-07-18 ENCOUNTER — Emergency Department (HOSPITAL_COMMUNITY)
Admission: EM | Admit: 2017-07-18 | Discharge: 2017-07-18 | Disposition: A | Discharge status: Home / Self Care | Payer: Medicaid Other | Attending: Emergency Medicine | Admitting: Emergency Medicine

## 2017-07-18 ENCOUNTER — Other Ambulatory Visit: Payer: Self-pay

## 2017-07-18 DIAGNOSIS — F319 Bipolar disorder, unspecified: Secondary | ICD-10-CM | POA: Insufficient documentation

## 2017-07-18 DIAGNOSIS — I10 Essential (primary) hypertension: Secondary | ICD-10-CM | POA: Diagnosis not present

## 2017-07-18 DIAGNOSIS — Z79899 Other long term (current) drug therapy: Secondary | ICD-10-CM | POA: Diagnosis not present

## 2017-07-18 DIAGNOSIS — J069 Acute upper respiratory infection, unspecified: Secondary | ICD-10-CM | POA: Insufficient documentation

## 2017-07-18 DIAGNOSIS — G8929 Other chronic pain: Secondary | ICD-10-CM

## 2017-07-18 DIAGNOSIS — E119 Type 2 diabetes mellitus without complications: Secondary | ICD-10-CM | POA: Diagnosis not present

## 2017-07-18 DIAGNOSIS — Z87891 Personal history of nicotine dependence: Secondary | ICD-10-CM | POA: Diagnosis not present

## 2017-07-18 DIAGNOSIS — B9789 Other viral agents as the cause of diseases classified elsewhere: Secondary | ICD-10-CM

## 2017-07-18 DIAGNOSIS — L02811 Cutaneous abscess of head [any part, except face]: Secondary | ICD-10-CM | POA: Insufficient documentation

## 2017-07-18 DIAGNOSIS — F419 Anxiety disorder, unspecified: Secondary | ICD-10-CM | POA: Diagnosis not present

## 2017-07-18 DIAGNOSIS — Z9884 Bariatric surgery status: Secondary | ICD-10-CM | POA: Insufficient documentation

## 2017-07-18 DIAGNOSIS — L0291 Cutaneous abscess, unspecified: Secondary | ICD-10-CM

## 2017-07-18 DIAGNOSIS — R05 Cough: Secondary | ICD-10-CM | POA: Insufficient documentation

## 2017-07-18 DIAGNOSIS — R079 Chest pain, unspecified: Secondary | ICD-10-CM

## 2017-07-18 LAB — I-STAT BETA HCG BLOOD, ED (MC, WL, AP ONLY): I-stat hCG, quantitative: <5 m[IU]/mL (ref <5)

## 2017-07-18 LAB — BASIC METABOLIC PANEL
Anion gap: 14 (ref 5–15)
BUN: 10 mg/dL (ref 6–20)
CHLORIDE: 99 mmol/L — AB (ref 101–111)
CO2: 24 mmol/L (ref 22–32)
CREATININE: 0.74 mg/dL (ref 0.44–1.00)
Calcium: 9.1 mg/dL (ref 8.9–10.3)
GFR calc Af Amer: >60 mL/min (ref >60)
GFR calc non Af Amer: >60 mL/min (ref >60)
GLUCOSE: 134 mg/dL — AB (ref 65–99)
Potassium: 3.6 mmol/L (ref 3.5–5.1)
Sodium: 137 mmol/L (ref 135–145)

## 2017-07-18 LAB — CBC
HCT: 40.2 % (ref 36.0–46.0)
Hemoglobin: 12.5 g/dL (ref 12.0–15.0)
MCH: 27.2 pg (ref 26.0–34.0)
MCHC: 31.1 g/dL (ref 30.0–36.0)
MCV: 87.4 fL (ref 78.0–100.0)
PLATELETS: 389 10*3/uL (ref 150–400)
RBC: 4.6 MIL/uL (ref 3.87–5.11)
RDW: 13.9 % (ref 11.5–15.5)
WBC: 10 10*3/uL (ref 4.0–10.5)

## 2017-07-18 LAB — I-STAT TROPONIN, ED: Troponin i, poc: 0 ng/mL (ref 0.00–0.08)

## 2017-07-18 MED ORDER — SULFAMETHOXAZOLE-TRIMETHOPRIM 800-160 MG PO TABS: 1.0000 | ORAL_TABLET | Freq: Two times a day (BID) | ORAL | 0 refills | Status: AC

## 2017-07-18 NOTE — Discharge Instructions (Signed)
Please read attached information. If you experience any new or worsening signs or symptoms please return to the emergency room for evaluation. Please follow-up with your primary care provider or specialist as discussed. Please use medication prescribed only as directed and discontinue taking if you have any concerning signs or symptoms.   °

## 2017-07-18 NOTE — ED Triage Notes (Signed)
She states she had some flu symptoms with coughing, diarrhea fever, chills, and is resolving.  Pt has a quarter sized knot to posterior lower head that started 2 days, pt reports headache and chest pain. Chest pain started yesterday that is intermittent.

## 2017-07-18 NOTE — ED Provider Notes (Signed)
[facility] EMERGENCY DEPARTMENT Provider Note   CSN: [id number] Arrival date & time: 07/18/17  1030     History   Chief Complaint No chief complaint on file.   HPI Karen Brennan is a 42 y.o. female.  HPI    42 year old female presents today with numerous complaints.  Patient notes a history of chronic right-sided chest pain described as sore and achy, this is consistent and unchanged.  Patient notes she has been evaluated several times for this with no significant findings.  Patient reports that approximately 1.5 weeks ago she developed fever, chills, cough and headache, she notes that she no longer has fever or chills, she reports dry nonproductive cough and notes that her symptoms have dramatically improved.  Patient additionally notes that 4 days ago she put a hair relaxer and and developed a sore spot on the posterior aspect of her scalp.  She notes this continue to increase in size with purulent drainage.  No history of the same.   Past Medical History:  Diagnosis Date  . ANEMIA 08/14/2008   Qualifier: Diagnosis of  By: Genene Churn MD, Janett Billow    . Anxiety   . Bipolar 1 disorder ([place])   . Blood transfusion   . Depression   . GERD (gastroesophageal reflux disease)   . History of bronchitis   . Hypertension   . Kidney stones 10/09/11  . Migraines 10/09/11   "often"    Patient Active Problem List   Diagnosis Date Noted  . Rash and nonspecific skin eruption 11/13/2016  . Diabetes ([place]) 06/26/2016  . Neuropathic pain of both feet 05/09/2016  . Headache 05/09/2016  . 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 Preventative health care 12/09/2014  . Seborrheic dermatitis 12/05/2013  . Anxiety 10/25/2011  . Bipolar 1 disorder, mixed ([place]) 09/15/2011  . Insomnia 10/21/2010  . Allergic rhinitis 08/31/2008  . Esophageal reflux 01/24/2008  . HYPERTENSION, BENIGN ESSENTIAL 08/17/2006  . Sinusitis, chronic 08/17/2006  . Morbid obesity  with BMI of 50.0-59.9, adult ([place]) 08/09/2006  . DEPRESSION, MAJOR, RECURRENT 08/09/2006    Past Surgical History:  Procedure Laterality Date  . ANKLE FRACTURE SURGERY   1990's   "left; put screws in"  . ENDOMETRIAL ABLATION  ~ 2011  . FRACTURE SURGERY    . GASTRIC BY PASS    . TUBAL LIGATION  2006    OB History    No data available       Home Medications    Prior to Admission medications   Medication Sig Start Date End Date Taking? Authorizing Provider  ACCU-CHEK FASTCLIX LANCETS MISC Check blood sugar once daily, alternating before breakfast and an hour after largest meal of day. 11/30/16   Rumley, Heath N, DO  atorvastatin (LIPITOR) 20 MG tablet Take 1 tablet (20 mg total) by mouth daily. 03/09/17   Rogue Bussing, MD  Blood Glucose Monitoring Suppl (ACCU-CHEK AVIVA PLUS) w/Device KIT USE AS DIRECTED*NOT COVERED* 03/23/17   Nicolette Bang, DO  busPIRone (BUSPAR) 15 MG tablet Take 15 mg by mouth 3 (three) times daily. 04/09/14   [provider]  CALCIUM PO Take 1 tablet by mouth 3 (three) times daily.    [provider]  citalopram (CELEXA) 40 MG tablet Take 40 mg by mouth daily.    [provider]  doxycycline (VIBRA-TABS) 100 MG tablet Take 1 tablet (100 mg total) by mouth 2 (two) times daily. 11/30/16   Lorna Few, DO  Fluocinolone  Acetonide (DERMA-SMOOTHE/FS SCALP) 0.01 % OIL APPLY NO MORE THAN 1 OZ TO SCALP ONCE DAILY, LEAVE ON SCALP FOR 5 MINS, THEN RINSE Patient not taking: Reported on 06/23/2016 03/26/15   Rumley, Burna Cash, DO  fluticasone (FLONASE) 50 MCG/ACT nasal spray Place 2 sprays into both nostrils daily. 05/23/16   Janora Norlander, DO  gabapentin (NEURONTIN) 300 MG capsule Take 1 capsule (300 mg total) by mouth 3 (three) times daily. 04/13/17   Nicolette Bang, DO  glucose blood (ACCU-CHEK AVIVA PLUS) test strip Check blood sugar once daily. 05/07/17   Mayo, Pete Pelt, MD  glucose blood (ACCU-CHEK  GUIDE) test strip Use as instructed 05/07/17   Mayo, Pete Pelt, MD  hydrochlorothiazide (HYDRODIURIL) 25 MG tablet Take 1 tablet (25 mg total) by mouth daily. 08/21/16   Rumley, Burna Cash, DO  HYDROcodone-acetaminophen (NORCO) 5-325 MG tablet Take 1 tablet by mouth every 6 (six) hours as needed for moderate pain. 07/21/16   Kirichenko, Lahoma Rocker, PA-C  hydrOXYzine (ATARAX/VISTARIL) 25 MG tablet Take 50 mg by mouth 2 (two) times daily.    [provider]  ibuprofen (ADVIL,MOTRIN) 600 MG tablet Take 1 tablet (600 mg total) by mouth every 6 (six) hours as needed. 07/21/16   Jeannett Senior, PA-C  Lancet Devices ([facility]) lancets Use as instructed 02/16/17   Nicolette Bang, DO  Lancets (ACCU-CHEK MULTICLIX) lancets Check sugar daily 02/16/17   Nicolette Bang, DO  loratadine (CLARITIN) 10 MG tablet TAKE 1 TABLET (10 MG TOTAL) BY MOUTH DAILY. 05/09/16   Mikell, Jeani Sow, MD  methocarbamol (ROBAXIN) 750 MG tablet Take 1 tablet (750 mg total) by mouth every 6 (six) hours as needed for muscle spasms. 07/21/16   Kirichenko, Lahoma Rocker, PA-C  Multiple Vitamin (MULTIVITAMIN WITH MINERALS) TABS Take 1 tablet by mouth 3 (three) times daily.    [provider]  naproxen (NAPROSYN) 500 MG tablet Take 1 tablet (500 mg total) by mouth 2 (two) times daily as needed for headache. 07/05/16   Ronnie Doss M, DO  OLANZapine (ZYPREXA) 20 MG tablet Take 20 mg by mouth at bedtime.    [provider]  omeprazole (PRILOSEC) 20 MG capsule Take 1 capsule (20 mg total) by mouth daily. 07/18/16   Rumley, Burna Cash, DO  polyethylene glycol (MIRALAX / GLYCOLAX) packet Take 17 g by mouth daily as needed for mild constipation. 07/18/16   Rumley, Burna Cash, DO  sulfamethoxazole-trimethoprim (BACTRIM DS,SEPTRA DS) 800-160 MG tablet Take 1 tablet by mouth 2 (two) times daily for 7 days. 07/18/17 07/25/17  Rexanna Louthan, Dellis Filbert, PA-C  SUMAtriptan (IMITREX) 25 MG tablet Take 1 tablet (25 mg total)  by mouth once. May repeat ONE TIME in 2 hours if headache persists or recurs. 05/23/16 05/23/16  Janora Norlander, DO    Family History No family history on file.  Social History Social History   Tobacco Use  . Smoking status: Former Smoker    Packs/day: 0.25    Years: 4.00    Pack years: 1.00    Types: Cigarettes    Last attempt to quit: 06/12/2004    Years since quitting: 13.1  . Smokeless tobacco: Never Used  Substance Use Topics  . Alcohol use: No  . Drug use: No     Allergies   Sibutramine hcl monohydrate   Review of Systems Review of Systems  All other systems reviewed and are negative.    Physical Exam Updated Vital Signs BP 129/89   Pulse (!) 107  Temp 98.8 F (37.1 C) (Oral)   Resp 20   SpO2 99%   Physical Exam  Constitutional: She is oriented to person, place, and time. She appears well-developed and well-nourished.  Morbidly obese  HENT:  Head: Normocephalic and atraumatic.  2 cm area of induration to the posterior scalp, no central fluctuance, no surrounding redness, no discharge noted-neck is supple full active pain-free range of motion-1 small posterior cervical lymphadenopathy on the right  Eyes: Conjunctivae are normal. Pupils are equal, round, and reactive to light. Right eye exhibits no discharge. Left eye exhibits no discharge. No scleral icterus.  Neck: Normal range of motion. No JVD present. No tracheal deviation present.  Cardiovascular: Regular rhythm and normal heart sounds.  Pulmonary/Chest: Effort normal and breath sounds normal. No stridor. No respiratory distress. She has no wheezes. She has no rales. She exhibits no tenderness.  Neurological: She is alert and oriented to person, place, and time. Coordination normal.  Psychiatric: She has a normal mood and affect. Her behavior is normal. Judgment and thought content normal.  Nursing note and vitals reviewed.    ED Treatments / Results  Labs (all labs ordered are listed, but  only abnormal results are displayed) Labs Reviewed  BASIC METABOLIC PANEL - Abnormal; Notable for the following components:      Result Value   Chloride 99 (*)    Glucose, Bld 134 (*)    All other components within normal limits  CBC  I-STAT TROPONIN, ED  I-STAT BETA HCG BLOOD, ED ([facility], [facility], [facility] ONLY)    EKG  EKG Interpretation None       Radiology Dg Chest 2 View  Result Date: 07/18/2017 CLINICAL DATA:  Cough and fever EXAM: CHEST  2 VIEW COMPARISON:  April 09, 2017 FINDINGS: Lungs are clear. The heart size and pulmonary vascularity are normal. No adenopathy. No pneumothorax. No bone lesions. IMPRESSION: No edema or consolidation. Electronically Signed   By: Lowella Grip III M.D.   On: 07/18/2017 12:02    Procedures Procedures (including critical care time)  Medications Ordered in ED Medications - No data to display   Initial Impression / Assessment and Plan / ED Course  I have reviewed the triage vital signs and the nursing notes.  Pertinent labs & imaging results that were available during my care of the patient were reviewed by me and considered in my medical decision making (see chart for details).      Final Clinical Impressions(s) / ED Diagnoses   Final diagnoses:  Abscess  Viral URI with cough  Chronic chest pain    Labs: I stat trop, I stat beta HCG, BMP, CBC  Imaging: Dg chest- no acute abn  Consults:  Therapeutics:  Discharge Meds: Bactrim   Assessment/Plan: 42 year old female presents today with numerous complaints.  Patient's main complaint is the abscess to the posterior aspect of the scalp.  This appears to be mostly induration no drainable abscess at this time.  Patient be placed on antibiotics, will instructed to continue warm compress.  Patient also with resolving upper respiratory symptoms she is well-appearing in no acute distress, no signs of significant infection.  Patient having chronic chest pain, this is unchanged from previous,  reassuring EKG, laboratory analysis here.  Patient will be discharged with outpatient follow-up strict return precautions and symptomatic care instructions.  She verbalized understanding and agreement to today's plan had no further questions or concerns at time of discharge.    ED Discharge Orders  Ordered    sulfamethoxazole-trimethoprim (BACTRIM DS,SEPTRA DS) 800-160 MG tablet  2 times daily     07/18/17 1518       Okey Regal, PA-C 07/18/17 1519    Lajean Saver, MD 07/18/17 1544

## 2017-07-19 ENCOUNTER — Encounter: Payer: Self-pay | Admitting: Internal Medicine

## 2017-07-19 ENCOUNTER — Ambulatory Visit (INDEPENDENT_AMBULATORY_CARE_PROVIDER_SITE_OTHER): Payer: Medicaid Other | Admitting: Internal Medicine

## 2017-07-19 VITALS — Temp 98.3°F | Wt >= 6400 oz

## 2017-07-19 DIAGNOSIS — G5793 Unspecified mononeuropathy of bilateral lower limbs: Secondary | ICD-10-CM

## 2017-07-19 DIAGNOSIS — G629 Polyneuropathy, unspecified: Secondary | ICD-10-CM | POA: Diagnosis not present

## 2017-07-19 DIAGNOSIS — E119 Type 2 diabetes mellitus without complications: Secondary | ICD-10-CM | POA: Diagnosis not present

## 2017-07-19 DIAGNOSIS — Z0000 Encounter for general adult medical examination without abnormal findings: Secondary | ICD-10-CM

## 2017-07-19 DIAGNOSIS — Z23 Encounter for immunization: Secondary | ICD-10-CM

## 2017-07-19 DIAGNOSIS — L723 Sebaceous cyst: Secondary | ICD-10-CM | POA: Diagnosis not present

## 2017-07-19 MED ORDER — KETOROLAC TROMETHAMINE 30 MG/ML IJ SOLN
30.0000 mg | Freq: Once | INTRAMUSCULAR | Status: AC
  Administered 2017-07-19: 30 mg via INTRAMUSCULAR

## 2017-07-19 MED ORDER — HYDROCHLOROTHIAZIDE 25 MG PO TABS: 25.0000 mg | ORAL_TABLET | Freq: Every day | ORAL | 4 refills | Status: DC

## 2017-07-19 MED ORDER — GABAPENTIN 300 MG PO CAPS: 300.0000 mg | ORAL_CAPSULE | Freq: Three times a day (TID) | ORAL | 3 refills | Status: DC

## 2017-07-19 NOTE — Progress Notes (Signed)
   Subjective:    Karen Brennan - 42 y.o. female MRN [id number]  Date of birth: 08/19/1975  HPI  Karen Partngel M Bither is here for annual exam. Concern about scalp lesion. Was just see in ED last night and told she had an abscess. Given Bactrim. She has not yet started this.   ROS:  Patient reports no  vision/ hearing changes,anorexia, weight change, fever ,adenopathy, persistant / recurrent hoarseness, swallowing issues, chest pain, edema,persistant / recurrent cough, hemoptysis, dyspnea(rest, exertional, paroxysmal nocturnal), gastrointestinal  bleeding (melena, rectal bleeding), abdominal pain, excessive heart burn, GU symptoms(dysuria, hematuria, pyuria, voiding/incontinence  Issues) syncope, focal weakness, severe memory loss, concerning skin lesions, depression, anxiety, abnormal bruising/bleeding, major joint swelling, breast masses or abnormal vaginal bleeding.     Health Maintenance:  Health Maintenance Due  Topic Date Due  . PNEUMOCOCCAL POLYSACCHARIDE VACCINE (1) 05/17/1979  . FOOT EXAM  05/17/1987  . OPHTHALMOLOGY EXAM  05/17/1987  . URINE MICROALBUMIN  05/17/1987  . TETANUS/TDAP  10/10/2013    -  reports that she quit smoking about 13 years ago. Her smoking use included cigarettes. She has a 1.00 pack-year smoking history. she has never used smokeless tobacco.  - Past Medical History: Patient Active Problem List   Diagnosis Date Noted  . Rash and nonspecific skin eruption 11/13/2016  . Diabetes ([facility]) 06/26/2016  . Neuropathic pain of both feet 05/09/2016  . Headache 05/09/2016  . 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 Preventative health care 12/09/2014  . Seborrheic dermatitis 12/05/2013  . Anxiety 10/25/2011  . Bipolar 1 disorder, mixed ([facility]) 09/15/2011  . Insomnia 10/21/2010  . Allergic rhinitis 08/31/2008  . Esophageal reflux 01/24/2008  . HYPERTENSION, BENIGN ESSENTIAL 08/17/2006  . Sinusitis, chronic 08/17/2006  . Morbid obesity with  BMI of 50.0-59.9, adult ([facility]) 08/09/2006  . DEPRESSION, MAJOR, RECURRENT 08/09/2006   - Medications: reviewed and updated   Objective:   Physical Exam Temp 98.3 F (36.8 C) (Oral)   Wt (!) 461 lb 9.6 oz (209.4 kg)   SpO2 96%   BMI 57.70 kg/m  Gen: NAD, alert, cooperative with exam, well-appearing HEENT: NCAT, PERRL, clear conjunctiva, oropharynx clear, supple neck CV: RRR, good S1/S2, no murmur, no edema, capillary refill brisk  Resp: CTABL, no wheezes, non-labored Abd: SNTND, BS present, no guarding or organomegaly Skin: 2 cm area of firm, mobile mass without fluctuance or drainage or overlying erythema, TTP  Neuro: no gross deficits.  Psych: good insight, alert and oriented  Diabetic Foot Check -  Appearance - no lesions, ulcers or calluses Skin - no unusual pallor or redness Monofilament testing - normal bilaterally  Right - Great toe, medial, central, lateral ball and posterior foot intact Left - Great toe, medial, central, lateral ball and posterior foot intact     Assessment & Plan:   1. Type 2 diabetes mellitus without complication, without long-term current use of insulin ([facility]) -foot exam completed today  - Microalbumin/Creatinine Ratio, Urine -recommended diabetic eye exam   2. Sebaceous cyst Scalp lesion appears consistent with sebaceous cyst. May have component of infection given associated pain and reported drainage. Toradol injection given today for pain. Recommended starting course of Bactrim as prescribed by ED. Referral to dermatology for possible removal.  - Ambulatory referral to Dermatology  3. Healthcare maintenance - Pneumococcal conjugate vaccine 13-valent - Tdap vaccine greater than or equal to 7yo IM  Marcy Sirenatherine Cornell Gaber, D.O. 07/19/2017, 11:06 AM PGY-3, [facility]

## 2017-07-19 NOTE — Patient Instructions (Signed)
I will call you with your lab results from today. I have placed a referral to dermatology for the lesion on your scalp.

## 2017-07-20 LAB — MICROALBUMIN / CREATININE URINE RATIO
CREATININE, UR: 412 mg/dL
MICROALB/CREAT RATIO: 9.2 mg/g{creat} (ref 0.0–30.0)
Microalbumin, Urine: 38 ug/mL

## 2017-07-25 ENCOUNTER — Other Ambulatory Visit: Payer: Self-pay | Admitting: Internal Medicine

## 2017-07-25 ENCOUNTER — Telehealth: Payer: Self-pay | Admitting: Internal Medicine

## 2017-07-25 DIAGNOSIS — E1121 Type 2 diabetes mellitus with diabetic nephropathy: Secondary | ICD-10-CM

## 2017-07-25 MED ORDER — LOSARTAN POTASSIUM 50 MG PO TABS: 50.0000 mg | ORAL_TABLET | Freq: Every day | ORAL | 3 refills | Status: DC

## 2017-07-25 NOTE — Telephone Encounter (Signed)
Called patient to let her know that she had some microalbuminuria on her urine studies. Will prescribe Losartan for kidney protection in setting of T2DM. Will start with Losartan 50 mg. BP has typically been on higher side of normotensive so I think this will be safe from a BP standpoint. Explained this to patient and she had no questions. Will order BMET. Patient to make lab appointment for 1 week after starting medication.   Marcy Sirenatherine Nassir Neidert, D.O. 07/25/2017, 10:58 AM PGY-3, [facility]

## 2017-07-25 NOTE — Progress Notes (Signed)
Order for future BMET placed under orders only.   Marcy Sirenatherine Ceasar Decandia, D.O. 07/25/2017, 11:03 AM PGY-3, [facility]

## 2017-09-17 ENCOUNTER — Ambulatory Visit: Payer: Medicaid Other | Admitting: Internal Medicine

## 2017-11-08 ENCOUNTER — Ambulatory Visit: Payer: Medicaid Other | Admitting: Internal Medicine

## 2018-02-04 ENCOUNTER — Ambulatory Visit: Payer: Medicaid Other | Admitting: Family Medicine

## 2018-02-25 ENCOUNTER — Telehealth: Payer: Self-pay

## 2018-02-25 NOTE — Telephone Encounter (Signed)
Called in losartan 40mg  until the supply issue of 50mg  is fixed.  Pharmacy will call patient  -Dr. Parke SimmersBland

## 2018-02-25 NOTE — Telephone Encounter (Signed)
[facility] reports Losartan backordered and unable to order. Please send alternative.  Ples SpecterAlisa Volanda Mangine, RN [facility] RN)

## 2018-03-11 ENCOUNTER — Other Ambulatory Visit: Payer: Self-pay

## 2018-03-11 ENCOUNTER — Ambulatory Visit (INDEPENDENT_AMBULATORY_CARE_PROVIDER_SITE_OTHER): Payer: Medicaid Other | Admitting: Family Medicine

## 2018-03-11 ENCOUNTER — Encounter: Payer: Self-pay | Admitting: Family Medicine

## 2018-03-11 VITALS — BP 118/68 | HR 83 | Temp 97.9°F | Ht 75.0 in | Wt >= 6400 oz

## 2018-03-11 DIAGNOSIS — E119 Type 2 diabetes mellitus without complications: Secondary | ICD-10-CM

## 2018-03-11 DIAGNOSIS — Z23 Encounter for immunization: Secondary | ICD-10-CM | POA: Diagnosis not present

## 2018-03-11 DIAGNOSIS — E1121 Type 2 diabetes mellitus with diabetic nephropathy: Secondary | ICD-10-CM

## 2018-03-11 LAB — POCT GLYCOSYLATED HEMOGLOBIN (HGB A1C): HbA1c, POC (controlled diabetic range): 6.2 % (ref 0.0–7.0)

## 2018-03-11 LAB — GLUCOSE, POCT (MANUAL RESULT ENTRY): POC GLUCOSE: 141 mg/dL — AB (ref 70–99)

## 2018-03-11 MED ORDER — BLOOD GLUCOSE MONITOR KIT: PACK | 0 refills | Status: DC

## 2018-03-11 NOTE — Progress Notes (Signed)
    Subjective:  Karen Brennan is a 42 y.o. female who presents to the [facility] today with a chief complaint of diabetes /weigth management.   HPI: Patient is wanting to meet to discuss her diabetes.  She says it hasn't been checked in awhile.  She is nervous it will be worse because she hasn't been "eating right" and she says her weight won't go down.  She does say she gets out and walks ~34min 3x/week.  She says she was on metformin but she thinks it made her blood sugar go low which she thinks she can tell from feel.  She asks for a cbg now because she thinks he blood sugar is low right now (it was 141).  We discussed her need to get a new glucometer so that she can objectively check her sugars.  It is possible her weight is impacted by her decision to eat an entire sandwhich when she "feels" low.  Objective:  Physical Exam: BP 118/68   Pulse 83   Temp 97.9 F (36.6 C) (Oral)   Ht 6\' 3"  (1.905 m)   Wt (!) 464 lb 12.8 oz (210.8 kg)   SpO2 98%   BMI 58.10 kg/m   Gen: NAD, resting comfortably CV: RRR with no murmurs appreciated Pulm: NWOB, CTAB with no crackles, wheezes, or rhonchi GI: Normal bowel sounds present. Soft, Nontender, Nondistended. MSK: no edema, cyanosis, or clubbing noted Skin: warm, dry Neuro: grossly normal, moves all extremities Psych: Normal affect and thought content  Results for orders placed or performed in visit on 03/11/18 (from the past 72 hour(s))  HgB A1c     Status: None   Collection Time: 03/11/18  8:42 AM  Result Value Ref Range   Hemoglobin A1C     HbA1c POC (<> result, manual entry)     HbA1c, POC (prediabetic range)     HbA1c, POC (controlled diabetic range) 6.2 0.0 - 7.0 %  POCT glucose (manual entry)     Status: Abnormal   Collection Time: 03/11/18  8:50 AM  Result Value Ref Range   POC Glucose 141 (A) 70 - 99 mg/dl  Basic Metabolic Panel     Status: Abnormal   Collection Time: 03/11/18  9:22 AM  Result Value Ref Range   Glucose 134 (H) 65 -  99 mg/dL   BUN 9 6 - 24 mg/dL   Creatinine, Ser 1.61 0.57 - 1.00 mg/dL   GFR calc non Af Amer 87 >59 mL/min/1.73   GFR calc Af Amer 101 >59 mL/min/1.73   BUN/Creatinine Ratio 11 9 - 23   Sodium 142 134 - 144 mmol/L   Potassium 4.0 3.5 - 5.2 mmol/L   Chloride 99 96 - 106 mmol/L   CO2 28 20 - 29 mmol/L   Calcium 9.3 8.7 - 10.2 mg/dL     Assessment/Plan:  Diabetes (HCC) Now in pre-diabetic range but would like to improve on weight and A1C.  Will refer to nutrition for assistance.  Also ordering new glucometer as patient has been eating entire sandwhich when she felt her blood sugar was low and in clinic today she was convinced she was low but was found to be 141.  We discussed checking before eating extra food as a goal and increasing walks from 28m 3x/week to 5x/week.  She will come back and check in after meeting nutrition.   Marthenia Rolling, DO FAMILY MEDICINE RESIDENT - PGY2 03/12/2018 8:29 PM

## 2018-03-11 NOTE — Patient Instructions (Signed)
It was a pleasure to see you today! Thank you for choosing [facility] for your primary care. Karen Brennan was seen for diabetes management. Come back to the clinic in a few weeks to discuss your food/blood sugar/activity diary, and go to the emergency room if you have any life threatening symptoms.  Please go pick up the glucometer we prescribed, check your blood sugars if you feel like they are low.  If they are low, try to only the caloris required to bring it back up (I.e. The bread and maybe not the whole sandwich.  Remember your activity goal of walking every day that the kids go to school for .    Please bring all your medications to every doctors visit   Sign up for My Chart to have easy access to your labs results, and communication with your Primary care physician.     Please check-out at the front desk before leaving the clinic.     Best,  Dr. Marthenia Rolling FAMILY MEDICINE RESIDENT - PGY2 03/11/2018 9:07 AM

## 2018-03-12 LAB — BASIC METABOLIC PANEL
BUN/Creatinine Ratio: 11 (ref 9–23)
BUN: 9 mg/dL (ref 6–24)
CALCIUM: 9.3 mg/dL (ref 8.7–10.2)
CO2: 28 mmol/L (ref 20–29)
CREATININE: 0.84 mg/dL (ref 0.57–1.00)
Chloride: 99 mmol/L (ref 96–106)
GFR, EST AFRICAN AMERICAN: 101 mL/min/{1.73_m2} (ref >59)
GFR, EST NON AFRICAN AMERICAN: 87 mL/min/{1.73_m2} (ref >59)
Glucose: 134 mg/dL — ABNORMAL HIGH (ref 65–99)
Potassium: 4 mmol/L (ref 3.5–5.2)
Sodium: 142 mmol/L (ref 134–144)

## 2018-03-12 NOTE — Assessment & Plan Note (Signed)
Now in pre-diabetic range but would like to improve on weight and A1C.  Will refer to nutrition for assistance.  Also ordering new glucometer as patient has been eating entire sandwhich when she felt her blood sugar was low and in clinic today she was convinced she was low but was found to be 141.  We discussed checking before eating extra food as a goal and increasing walks from 20m 3x/week to 5x/week.  She will come back and check in after meeting nutrition.

## 2018-04-15 ENCOUNTER — Ambulatory Visit: Payer: Self-pay | Admitting: Dietician

## 2018-04-27 ENCOUNTER — Other Ambulatory Visit: Payer: Self-pay | Admitting: Family Medicine

## 2018-05-28 ENCOUNTER — Other Ambulatory Visit: Payer: Self-pay | Admitting: *Deleted

## 2018-05-28 DIAGNOSIS — G5793 Unspecified mononeuropathy of bilateral lower limbs: Secondary | ICD-10-CM

## 2018-05-28 MED ORDER — GABAPENTIN 300 MG PO CAPS: 300.0000 mg | ORAL_CAPSULE | Freq: Three times a day (TID) | ORAL | 3 refills | Status: DC

## 2018-06-28 ENCOUNTER — Ambulatory Visit: Payer: Medicaid Other | Admitting: Family Medicine

## 2018-07-09 ENCOUNTER — Emergency Department (HOSPITAL_COMMUNITY)
Admission: EM | Admit: 2018-07-09 | Discharge: 2018-07-10 | Disposition: A | Discharge status: Home / Self Care | Payer: Medicaid Other | Attending: Emergency Medicine | Admitting: Emergency Medicine

## 2018-07-09 ENCOUNTER — Encounter (HOSPITAL_COMMUNITY): Payer: Self-pay | Admitting: Emergency Medicine

## 2018-07-09 ENCOUNTER — Emergency Department (HOSPITAL_COMMUNITY): Payer: Medicaid Other

## 2018-07-09 ENCOUNTER — Other Ambulatory Visit: Payer: Self-pay

## 2018-07-09 DIAGNOSIS — Z79899 Other long term (current) drug therapy: Secondary | ICD-10-CM | POA: Diagnosis not present

## 2018-07-09 DIAGNOSIS — I10 Essential (primary) hypertension: Secondary | ICD-10-CM | POA: Diagnosis not present

## 2018-07-09 DIAGNOSIS — R072 Precordial pain: Secondary | ICD-10-CM | POA: Insufficient documentation

## 2018-07-09 DIAGNOSIS — E119 Type 2 diabetes mellitus without complications: Secondary | ICD-10-CM | POA: Diagnosis not present

## 2018-07-09 DIAGNOSIS — Z87891 Personal history of nicotine dependence: Secondary | ICD-10-CM | POA: Diagnosis not present

## 2018-07-09 LAB — CBC
HCT: 35.6 % — ABNORMAL LOW (ref 36.0–46.0)
Hemoglobin: 10.8 g/dL — ABNORMAL LOW (ref 12.0–15.0)
MCH: 25.8 pg — ABNORMAL LOW (ref 26.0–34.0)
MCHC: 30.3 g/dL (ref 30.0–36.0)
MCV: 85.2 fL (ref 80.0–100.0)
Platelets: 334 10*3/uL (ref 150–400)
RBC: 4.18 MIL/uL (ref 3.87–5.11)
RDW: 14.1 % (ref 11.5–15.5)
WBC: 8 10*3/uL (ref 4.0–10.5)
nRBC: 0 % (ref 0.0–0.2)

## 2018-07-09 LAB — BASIC METABOLIC PANEL
Anion gap: 11 (ref 5–15)
BUN: 7 mg/dL (ref 6–20)
CO2: 24 mmol/L (ref 22–32)
Calcium: 8.6 mg/dL — ABNORMAL LOW (ref 8.9–10.3)
Chloride: 102 mmol/L (ref 98–111)
Creatinine, Ser: 0.77 mg/dL (ref 0.44–1.00)
GFR calc Af Amer: >60 mL/min (ref >60)
GFR calc non Af Amer: >60 mL/min (ref >60)
Glucose, Bld: 159 mg/dL — ABNORMAL HIGH (ref 70–99)
POTASSIUM: 3 mmol/L — AB (ref 3.5–5.1)
Sodium: 137 mmol/L (ref 135–145)

## 2018-07-09 LAB — I-STAT TROPONIN, ED: Troponin i, poc: 0 ng/mL (ref 0.00–0.08)

## 2018-07-09 MED ORDER — SODIUM CHLORIDE 0.9% FLUSH: 3.0000 mL | Freq: Once | INTRAVENOUS | Status: DC

## 2018-07-09 NOTE — ED Triage Notes (Signed)
C/o R arm pain x 1 week.  Reports aching to center of chest, mild sob, and nausea x 2 hours.

## 2018-07-10 ENCOUNTER — Emergency Department (HOSPITAL_COMMUNITY): Payer: Medicaid Other

## 2018-07-10 LAB — I-STAT TROPONIN, ED: Troponin i, poc: 0 ng/mL (ref 0.00–0.08)

## 2018-07-10 LAB — D-DIMER, QUANTITATIVE: D-Dimer, Quant: 1.31 ug/mL-FEU — ABNORMAL HIGH (ref 0.00–0.50)

## 2018-07-10 MED ORDER — POTASSIUM CHLORIDE CRYS ER 20 MEQ PO TBCR
60.0000 meq | EXTENDED_RELEASE_TABLET | Freq: Once | ORAL | Status: AC
  Administered 2018-07-10: 60 meq via ORAL
  Filled 2018-07-10: qty 3

## 2018-07-10 MED ORDER — ALUM & MAG HYDROXIDE-SIMETH 200-200-20 MG/5ML PO SUSP
30.0000 mL | Freq: Once | ORAL | Status: AC
  Administered 2018-07-10: 30 mL via ORAL
  Filled 2018-07-10: qty 30

## 2018-07-10 MED ORDER — IOPAMIDOL (ISOVUE-370) INJECTION 76%
100.0000 mL | Freq: Once | INTRAVENOUS | Status: AC | PRN
  Administered 2018-07-10: 100 mL via INTRAVENOUS

## 2018-07-10 NOTE — ED Notes (Signed)
Patient states her chest is worse with inspiration

## 2018-07-10 NOTE — ED Provider Notes (Signed)
[facility] Provider Note   CSN: [id number] Arrival date & time: 07/09/18  1958     History   Chief Complaint Chief Complaint  Patient presents with  . Chest Pain    HPI Karen Brennan is a 43 y.o. female.  The history is provided by the patient.  Chest Pain  Pain location:  Substernal area Pain quality: dull   Pain radiates to:  Does not radiate Pain severity:  Moderate Onset quality:  Gradual Duration:  1 week Timing:  Constant Progression:  Unchanged Chronicity:  New Context: movement and at rest   Context: not breathing   Relieved by:  Nothing Worsened by:  Nothing Ineffective treatments:  None tried Associated symptoms: no abdominal pain, no cough, no diaphoresis, no dizziness, no fever, no lower extremity edema, no nausea, no palpitations, no shortness of breath and no vomiting   Risk factors: obesity   Risk factors: no aortic disease   No DOE, no SOB, no n/v/d.  No f/c/r feels a lump on her forearm she wants checked.    Past Medical History:  Diagnosis Date  . ANEMIA 08/14/2008   Qualifier: Diagnosis of  By: Genene Churn MD, Janett Billow    . Anxiety   . Bipolar 1 disorder ([place])   . Blood transfusion   . Depression   . GERD (gastroesophageal reflux disease)   . History of bronchitis   . Hypertension   . Kidney stones 10/09/11  . Migraines 10/09/11   "often"    Patient Active Problem List   Diagnosis Date Noted  . Rash and nonspecific skin eruption 11/13/2016  . Diabetes ([place]) 06/26/2016  . Neuropathic pain of both feet 05/09/2016  . Headache 05/09/2016  . 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 Preventative health care 12/09/2014  . Seborrheic dermatitis 12/05/2013  . Anxiety 10/25/2011  . Bipolar 1 disorder, mixed ([place]) 09/15/2011  . Insomnia 10/21/2010  . Allergic rhinitis 08/31/2008  . Esophageal reflux 01/24/2008  . HYPERTENSION, BENIGN ESSENTIAL 08/17/2006  . Sinusitis, chronic  08/17/2006  . Morbid obesity with BMI of 50.0-59.9, adult ([place]) 08/09/2006  . DEPRESSION, MAJOR, RECURRENT 08/09/2006    Past Surgical History:  Procedure Laterality Date  . ANKLE FRACTURE SURGERY   1990's   "left; put screws in"  . ENDOMETRIAL ABLATION  ~ 2011  . FRACTURE SURGERY    . GASTRIC BY PASS    . TUBAL LIGATION  2006     OB History   No obstetric history on file.      Home Medications    Prior to Admission medications   Medication Sig Start Date End Date Taking? Authorizing Provider  FLUoxetine (PROZAC) 40 MG capsule Take 40 mg by mouth daily.   Yes [provider]  gabapentin (NEURONTIN) 300 MG capsule Take 1 capsule (300 mg total) by mouth 3 (three) times daily. 05/28/18  Yes Bland, Scott, DO  haloperidol (HALDOL) 2 MG tablet Take 2 mg by mouth daily.   Yes [provider]  hydrochlorothiazide (HYDRODIURIL) 25 MG tablet Take 1 tablet (25 mg total) by mouth daily. 07/19/17  Yes Nicolette Bang, DO  hydrOXYzine (ATARAX/VISTARIL) 25 MG tablet Take 50 mg by mouth at bedtime.    Yes [provider]  OLANZapine (ZYPREXA) 20 MG tablet Take 20 mg by mouth at bedtime.    Yes [provider]  valsartan (DIOVAN) 40 MG tablet TAKE 1 TABLET BY MOUTH EVERY DAY (LOSARTAN 50MG ON BACK ORDER) Patient  taking differently: Take 40 mg by mouth daily.  04/29/18  Yes Sherene Sires, DO  ACCU-CHEK FASTCLIX LANCETS MISC Check blood sugar once daily, alternating before breakfast and an hour after largest meal of day. 11/30/16   Rumley, Vincent N, DO  atorvastatin (LIPITOR) 20 MG tablet Take 1 tablet (20 mg total) by mouth daily. Patient not taking: Reported on 07/10/2018 03/09/17   Rogue Bussing, MD  blood glucose meter kit and supplies KIT Dispense based on patient and insurance preference. Use up to four times daily as directed. (FOR ICD-9 250.00, 250.01). 03/11/18   Sherene Sires, DO  Blood Glucose Monitoring Suppl (ACCU-CHEK AVIVA PLUS)  w/Device KIT USE AS DIRECTED*NOT COVERED* 03/23/17   Nicolette Bang, DO  doxycycline (VIBRA-TABS) 100 MG tablet Take 1 tablet (100 mg total) by mouth 2 (two) times daily. Patient not taking: Reported on 07/10/2018 11/30/16   Lorna Few, DO  Fluocinolone Acetonide (DERMA-SMOOTHE/FS SCALP) 0.01 % OIL APPLY NO MORE THAN 1 OZ TO SCALP ONCE DAILY, LEAVE ON SCALP FOR 5 MINS, THEN RINSE Patient not taking: Reported on 06/23/2016 03/26/15   Rumley, Burna Cash, DO  fluticasone (FLONASE) 50 MCG/ACT nasal spray Place 2 sprays into both nostrils daily. Patient not taking: Reported on 07/10/2018 05/23/16   Janora Norlander, DO  glucose blood (ACCU-CHEK AVIVA PLUS) test strip Check blood sugar once daily. 05/07/17   Mayo, Pete Pelt, MD  glucose blood (ACCU-CHEK GUIDE) test strip Use as instructed 05/07/17   Mayo, Pete Pelt, MD  HYDROcodone-acetaminophen (NORCO) 5-325 MG tablet Take 1 tablet by mouth every 6 (six) hours as needed for moderate pain. Patient not taking: Reported on 07/10/2018 07/21/16   Jeannett Senior, PA-C  ibuprofen (ADVIL,MOTRIN) 600 MG tablet Take 1 tablet (600 mg total) by mouth every 6 (six) hours as needed. Patient not taking: Reported on 07/10/2018 07/21/16   Jeannett Senior, PA-C  Lancet Devices [facility]) lancets Use as instructed 02/16/17   Nicolette Bang, DO  Lancets (ACCU-CHEK MULTICLIX) lancets Check sugar daily 02/16/17   Nicolette Bang, DO  loratadine (CLARITIN) 10 MG tablet TAKE 1 TABLET (10 MG TOTAL) BY MOUTH DAILY. Patient not taking: Reported on 07/10/2018 05/09/16   Tonette Bihari, MD  losartan (COZAAR) 50 MG tablet Take 1 tablet (50 mg total) by mouth daily. Patient not taking: Reported on 07/10/2018 07/25/17   Nicolette Bang, DO  methocarbamol (ROBAXIN) 750 MG tablet Take 1 tablet (750 mg total) by mouth every 6 (six) hours as needed for muscle spasms. Patient not taking: Reported on 07/10/2018 07/21/16   Jeannett Senior, PA-C  naproxen (NAPROSYN) 500 MG tablet Take 1 tablet (500 mg total) by mouth 2 (two) times daily as needed for headache. Patient not taking: Reported on 07/10/2018 07/05/16   Janora Norlander, DO  omeprazole (PRILOSEC) 20 MG capsule Take 1 capsule (20 mg total) by mouth daily. Patient not taking: Reported on 07/10/2018 07/18/16   Lorna Few, DO  polyethylene glycol (MIRALAX / GLYCOLAX) packet Take 17 g by mouth daily as needed for mild constipation. Patient not taking: Reported on 07/10/2018 07/18/16   Lorna Few, DO  SUMAtriptan (IMITREX) 25 MG tablet Take 1 tablet (25 mg total) by mouth once. May repeat ONE TIME in 2 hours if headache persists or recurs. Patient not taking: Reported on 07/10/2018 05/23/16 07/10/26  Janora Norlander, DO    Family History No family history on file.  Social History Social History   Tobacco Use  .  Smoking status: Former Smoker    Packs/day: 0.25    Years: 4.00    Pack years: 1.00    Types: Cigarettes    Last attempt to quit: 06/12/2004    Years since quitting: 14.0  . Smokeless tobacco: Never Used  Substance Use Topics  . Alcohol use: No  . Drug use: No     Allergies   Sibutramine hcl monohydrate   Review of Systems Review of Systems  Constitutional: Negative for diaphoresis and fever.  Respiratory: Negative for cough, shortness of breath and wheezing.   Cardiovascular: Positive for chest pain. Negative for palpitations and leg swelling.  Gastrointestinal: Negative for abdominal pain, nausea and vomiting.  Neurological: Negative for dizziness.  Hematological: Negative for adenopathy. Does not bruise/bleed easily.  All other systems reviewed and are negative.    Physical Exam Updated Vital Signs BP 118/83 (BP Location: Left Arm)   Pulse (!) 112   Temp 98.6 F (37 C) (Oral)   Resp 20   Ht 6' 2"  (1.88 m)   Wt (!) 203.2 kg   SpO2 93%   BMI 57.52 kg/m   Physical Exam Vitals signs and nursing note reviewed.    Constitutional:      Appearance: Normal appearance.  HENT:     Head: Normocephalic and atraumatic.     Nose: Nose normal.     Mouth/Throat:     Mouth: Mucous membranes are moist.     Pharynx: Oropharynx is clear.  Eyes:     Conjunctiva/sclera: Conjunctivae normal.     Pupils: Pupils are equal, round, and reactive to light.  Neck:     Musculoskeletal: Normal range of motion and neck supple.     Comments: Isolated 64m lymph node, not matted freely mobile.  No other nodes Cardiovascular:     Rate and Rhythm: Normal rate and regular rhythm.     Pulses: Normal pulses.     Heart sounds: Normal heart sounds.  Pulmonary:     Effort: Pulmonary effort is normal.     Breath sounds: Normal breath sounds.  Abdominal:     General: Abdomen is flat. Bowel sounds are normal.     Palpations: Abdomen is soft.     Tenderness: There is no abdominal tenderness.  Musculoskeletal: Normal range of motion.        General: No tenderness.     Right lower leg: No edema.     Left lower leg: No edema.     Comments: Lipoma of right foream 2 cm [facility] Lymphadenopathy:     Head:     Right side of head: No submental, submandibular, preauricular or posterior auricular adenopathy.     Left side of head: No submental, submandibular, preauricular or posterior auricular adenopathy.     Cervical:     Right cervical: No deep cervical adenopathy.    Left cervical: No deep cervical adenopathy.     Upper Body:     Right upper body: No supraclavicular, pectoral or epitrochlear adenopathy.     Left upper body: No supraclavicular, pectoral or epitrochlear adenopathy.     Lower Body: No right inguinal adenopathy. No left inguinal adenopathy.  Skin:    General: Skin is warm and dry.     Capillary Refill: Capillary refill takes less than 2 seconds.  Neurological:     General: No focal deficit present.     Mental Status: She is alert and oriented to person, place, and time.  Psychiatric:  Mood and Affect:  Mood normal.        Behavior: Behavior normal.      ED Treatments / Results  Labs (all labs ordered are listed, but only abnormal results are displayed) Results for orders placed or performed during the hospital encounter of 35/68/61  Basic metabolic panel  Result Value Ref Range   Sodium 137 135 - 145 mmol/L   Potassium 3.0 (L) 3.5 - 5.1 mmol/L   Chloride 102 98 - 111 mmol/L   CO2 24 22 - 32 mmol/L   Glucose, Bld 159 (H) 70 - 99 mg/dL   BUN 7 6 - 20 mg/dL   Creatinine, Ser 0.77 0.44 - 1.00 mg/dL   Calcium 8.6 (L) 8.9 - 10.3 mg/dL   GFR calc non Af Amer >60 >60 mL/min   GFR calc Af Amer >60 >60 mL/min   Anion gap 11 5 - 15  CBC  Result Value Ref Range   WBC 8.0 4.0 - 10.5 K/uL   RBC 4.18 3.87 - 5.11 MIL/uL   Hemoglobin 10.8 (L) 12.0 - 15.0 g/dL   HCT 35.6 (L) 36.0 - 46.0 %   MCV 85.2 80.0 - 100.0 fL   MCH 25.8 (L) 26.0 - 34.0 pg   MCHC 30.3 30.0 - 36.0 g/dL   RDW 14.1 11.5 - 15.5 %   Platelets 334 150 - 400 K/uL   nRBC 0.0 0.0 - 0.2 %  D-dimer, quantitative (not at [facility])  Result Value Ref Range   D-Dimer, Quant 1.31 (H) 0.00 - 0.50 ug/mL-FEU  I-stat troponin, ED  Result Value Ref Range   Troponin i, poc 0.00 0.00 - 0.08 ng/mL   Comment 3          I-stat troponin, ED  Result Value Ref Range   Troponin i, poc 0.00 0.00 - 0.08 ng/mL   Comment 3           Dg Chest 2 View  Result Date: 07/09/2018 CLINICAL DATA:  Chest pain. EXAM: CHEST - 2 VIEW COMPARISON:  07/18/2017 FINDINGS: The heart size and mediastinal contours are within normal limits. Both lungs are clear. No pleural effusion or pneumothorax. The visualized skeletal structures are unremarkable. IMPRESSION: Normal chest radiographs. Electronically Signed   By: Lajean Manes M.D.   On: 07/09/2018 21:29   Ct Angio Chest Pe W And/or Wo Contrast  Result Date: 07/10/2018 CLINICAL DATA:  Pain and shortness of breath.  Elevated D-dimer. EXAM: CT ANGIOGRAPHY CHEST WITH CONTRAST TECHNIQUE: Multidetector CT imaging of the  chest was performed using the standard protocol during bolus administration of intravenous contrast. Multiplanar CT image reconstructions and MIPs were obtained to evaluate the vascular anatomy. CONTRAST:  120m ISOVUE-370 IOPAMIDOL (ISOVUE-370) INJECTION 76% COMPARISON:  Chest x-ray dated 07/09/2018 FINDINGS: Cardiovascular: Satisfactory opacification of the pulmonary arteries to the segmental level. No evidence of pulmonary embolism. Normal heart size. No pericardial effusion. Mediastinum/Nodes: No enlarged mediastinal, hilar, or axillary lymph nodes. Thyroid gland, trachea, and esophagus demonstrate no significant findings. Lungs/Pleura: Lungs are clear. No pleural effusion or pneumothorax. Upper Abdomen: No acute abnormality.  Previous gastric surgery. Musculoskeletal: No chest wall abnormality. No acute or significant osseous findings. Review of the MIP images confirms the above findings. IMPRESSION: 1. No evidence of pulmonary emboli. 2. No acute abnormalities. Electronically Signed   By: JLorriane ShireM.D.   On: 07/10/2018 06:22    EKG  EKG Interpretation  Date/Time:  Wednesday July 10 2018 06:54:40 EST Ventricular Rate:  84 PR Interval:  QRS Duration: 98 QT Interval:  433 QTC Calculation: 512 R Axis:   88 Text Interpretation:  Sinus rhythm Ventricular premature complex Low voltage, precordial leads Borderline repolarization abnormality Prolonged QT interval Confirmed by Dory Horn) on 07/10/2018 6:59:38 AM       Radiology Dg Chest 2 View  Result Date: 07/09/2018 CLINICAL DATA:  Chest pain. EXAM: CHEST - 2 VIEW COMPARISON:  07/18/2017 FINDINGS: The heart size and mediastinal contours are within normal limits. Both lungs are clear. No pleural effusion or pneumothorax. The visualized skeletal structures are unremarkable. IMPRESSION: Normal chest radiographs. Electronically Signed   By: Lajean Manes M.D.   On: 07/09/2018 21:29   Ct Angio Chest Pe W And/or Wo  Contrast  Result Date: 07/10/2018 CLINICAL DATA:  Pain and shortness of breath.  Elevated D-dimer. EXAM: CT ANGIOGRAPHY CHEST WITH CONTRAST TECHNIQUE: Multidetector CT imaging of the chest was performed using the standard protocol during bolus administration of intravenous contrast. Multiplanar CT image reconstructions and MIPs were obtained to evaluate the vascular anatomy. CONTRAST:  160m ISOVUE-370 IOPAMIDOL (ISOVUE-370) INJECTION 76% COMPARISON:  Chest x-ray dated 07/09/2018 FINDINGS: Cardiovascular: Satisfactory opacification of the pulmonary arteries to the segmental level. No evidence of pulmonary embolism. Normal heart size. No pericardial effusion. Mediastinum/Nodes: No enlarged mediastinal, hilar, or axillary lymph nodes. Thyroid gland, trachea, and esophagus demonstrate no significant findings. Lungs/Pleura: Lungs are clear. No pleural effusion or pneumothorax. Upper Abdomen: No acute abnormality.  Previous gastric surgery. Musculoskeletal: No chest wall abnormality. No acute or significant osseous findings. Review of the MIP images confirms the above findings. IMPRESSION: 1. No evidence of pulmonary emboli. 2. No acute abnormalities. Electronically Signed   By: JLorriane ShireM.D.   On: 07/10/2018 06:22    Procedures Procedures (including critical care time)  Medications Ordered in ED Medications  sodium chloride flush (NS) 0.9 % injection 3 mL (has no administration in time range)  alum & mag hydroxide-simeth (MAALOX/MYLANTA) 200-200-20 MG/5ML suspension 30 mL (30 mLs Oral Given 07/10/18 0514)  potassium chloride SA (K-DUR,KLOR-CON) CR tablet 60 mEq (60 mEq Oral Given 07/10/18 0514)  iopamidol (ISOVUE-370) 76 % injection 100 mL (100 mLs Intravenous Contrast Given 07/10/18 0552)    HEART score 1 ruled out for MI in the ED.  Symptoms consistent with MSK pain alternated tylenol and ibuprofen.  Ruled out for PE with negative CTA of the chest.      Final Clinical Impressions(s) / ED  Diagnoses  Patient informed she will need recheck of isolated lymph node in one week by PMD.  Patient states she has had a cold and this is likely the cause but patient expresses verbal understanding that it needs recheck.  She will also follow up regarding ongoing chest pain.    Return for pain, intractable cough, productive cough,fevers >100.4 unrelieved by medication, shortness of breath, intractable vomiting, or diarrhea, abdominal pain, Inability to tolerate liquids or food, cough, altered mental status or any concerns. No signs of systemic illness or infection. The patient is nontoxic-appearing on exam and vital signs are within normal limits.   I have reviewed the triage vital signs and the nursing notes. Pertinent labs &imaging results that were available during my care of the patient were reviewed by me and considered in my medical decision making (see chart for details).  After history, exam, and medical workup I feel the patient has been appropriately medically screened and is safe for discharge home. Pertinent diagnoses were discussed with the patient. Patient was given return precautions.  Rosie Torrez, MD 07/10/18 0700

## 2018-07-10 NOTE — ED Notes (Signed)
Patient verbalizes understanding of discharge instructions. Opportunity for questioning and answers were provided. Armband removed by staff, pt discharged from ED ambulatory.   

## 2018-08-23 ENCOUNTER — Telehealth: Payer: Self-pay | Admitting: Family Medicine

## 2018-08-23 ENCOUNTER — Other Ambulatory Visit: Payer: Self-pay | Admitting: Family Medicine

## 2018-08-23 DIAGNOSIS — I10 Essential (primary) hypertension: Secondary | ICD-10-CM

## 2018-08-23 MED ORDER — HYDROCHLOROTHIAZIDE 25 MG PO TABS: 25.0000 mg | ORAL_TABLET | Freq: Every day | ORAL | 4 refills | Status: DC

## 2018-08-23 NOTE — Telephone Encounter (Signed)
Pt requesting refill on her hydrochlorothiazide. Pt was told by her pharmacy they have faxed [place] a few request but we haven't responded. Told pt we have not received any request for this medication. Please contact pt when this has been filled.   Pt pharmacy is [facility] [address]

## 2018-08-23 NOTE — Progress Notes (Signed)
HCTZ refilled-  Dr. Parke Simmers

## 2018-11-05 ENCOUNTER — Other Ambulatory Visit: Payer: Self-pay | Admitting: Family Medicine

## 2018-11-05 DIAGNOSIS — G5793 Unspecified mononeuropathy of bilateral lower limbs: Secondary | ICD-10-CM

## 2018-11-05 NOTE — Telephone Encounter (Signed)
Valsartan not covered per pharmacy. Jone Baseman, CMA

## 2018-11-10 ENCOUNTER — Other Ambulatory Visit: Payer: Self-pay | Admitting: Family Medicine

## 2018-11-11 ENCOUNTER — Telehealth: Payer: Self-pay | Admitting: *Deleted

## 2018-11-11 DIAGNOSIS — G5793 Unspecified mononeuropathy of bilateral lower limbs: Secondary | ICD-10-CM

## 2018-11-11 NOTE — Telephone Encounter (Signed)
Pharmacy calls because there is a issue with Dr. Parke Simmers and medicaid (the issue is being worked on).  In the meantime will forward to Dr. Frances Furbish to resend scripts. Jone Baseman, CMA

## 2018-11-15 ENCOUNTER — Other Ambulatory Visit: Payer: Self-pay | Admitting: Family Medicine

## 2018-11-15 MED ORDER — GABAPENTIN 300 MG PO CAPS: 300.0000 mg | ORAL_CAPSULE | Freq: Three times a day (TID) | ORAL | 0 refills | Status: DC

## 2018-11-15 MED ORDER — VALSARTAN 40 MG PO TABS: 40.0000 mg | ORAL_TABLET | Freq: Every day | ORAL | 11 refills | Status: DC

## 2018-11-15 NOTE — Addendum Note (Signed)
Addended by: Jone Baseman D on: 11/15/2018 11:58 AM   Modules accepted: Orders

## 2018-11-15 NOTE — Telephone Encounter (Addendum)
Pt calling to check status. Kejon Feild, CMA  

## 2018-11-15 NOTE — Addendum Note (Signed)
Addended by: Jone Baseman D on: 11/15/2018 11:57 AM   Modules accepted: Orders

## 2018-11-15 NOTE — Addendum Note (Signed)
Addended by: Lezlie Octave C on: 11/15/2018 12:14 PM   Modules accepted: Orders

## 2019-02-06 IMAGING — CT CT RENAL STONE PROTOCOL
2 of 4 series · 17 of 46 positions shown, 19 images · non-contrast
Comparison: 11/25/2011

CLINICAL DATA: Nephrolithiasis.

EXAM:
CT ABDOMEN AND PELVIS WITHOUT CONTRAST
TECHNIQUE: Multidetector CT imaging of the abdomen and pelvis was performed
following the standard protocol without IV contrast.

[Series 2: axial st · axial · 0.88mm/px · z∈[+1273,+1738]mm · 14 of 107 slices shown, 16 images]
[im 7/107  soft-tissue]
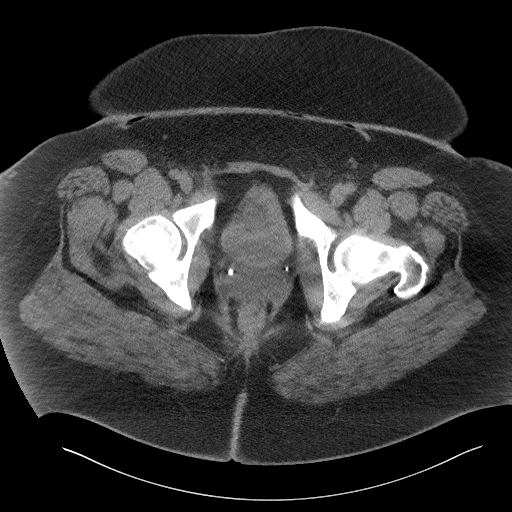
[im 7/107  bone]
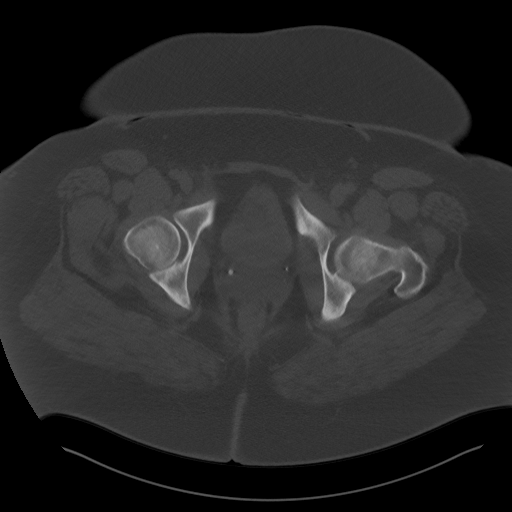
[im 13/107  soft-tissue]
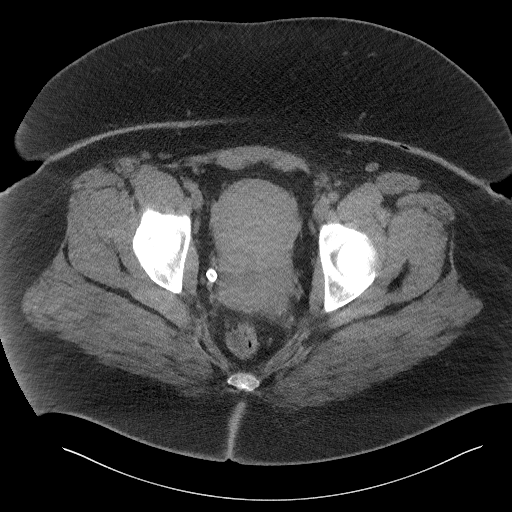
[im 19/107  soft-tissue]
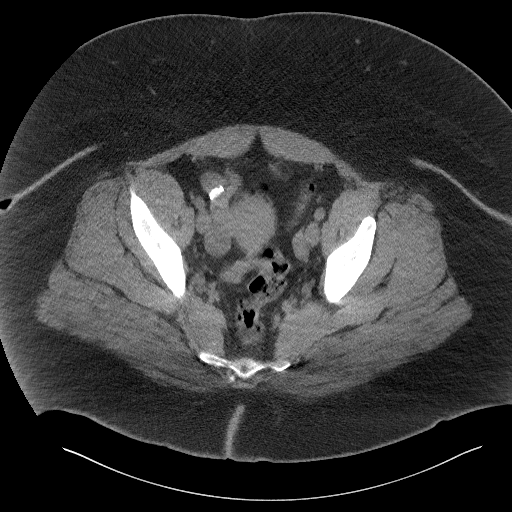
[im 32/107  soft-tissue]
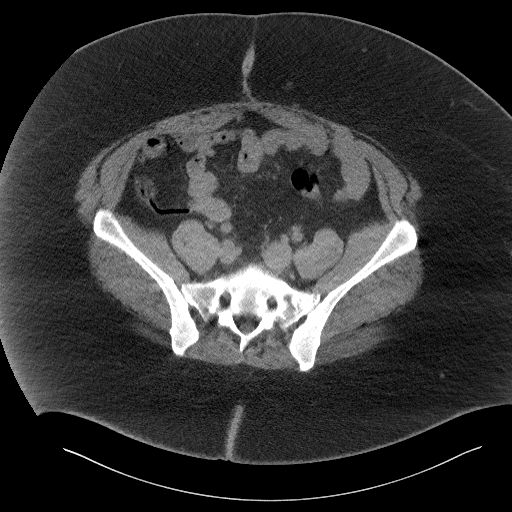
[im 38/107  soft-tissue]
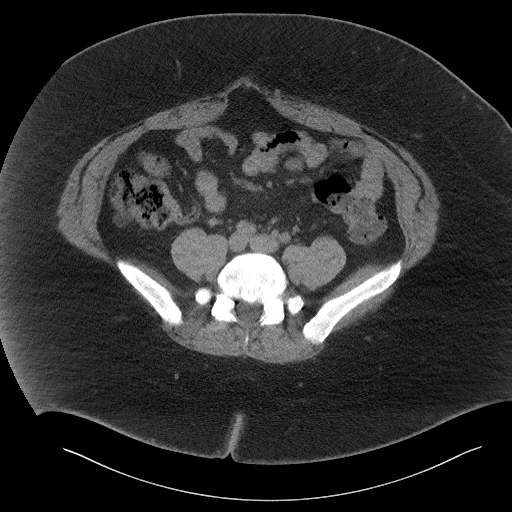
[im 44/107  soft-tissue]
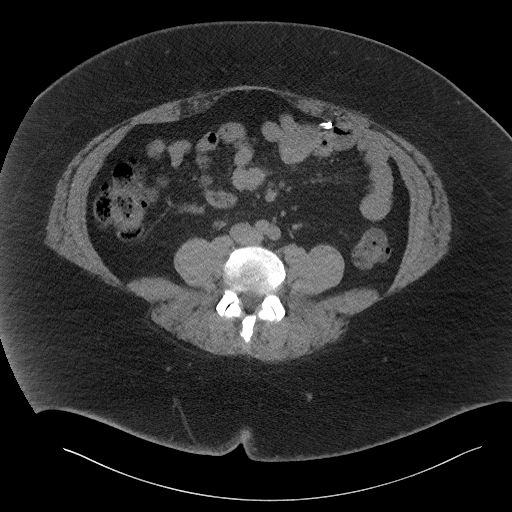
[im 50/107  soft-tissue]
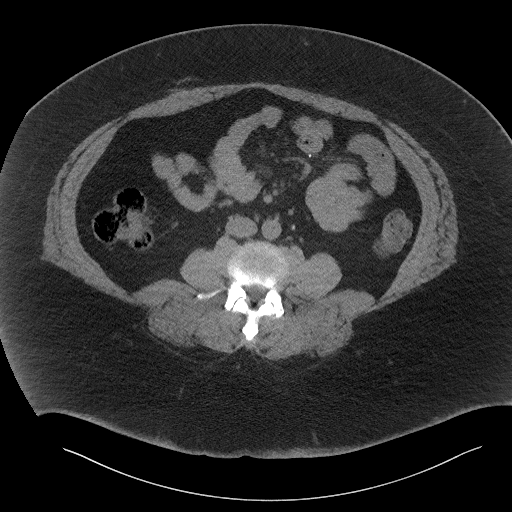
[im 57/107  soft-tissue]
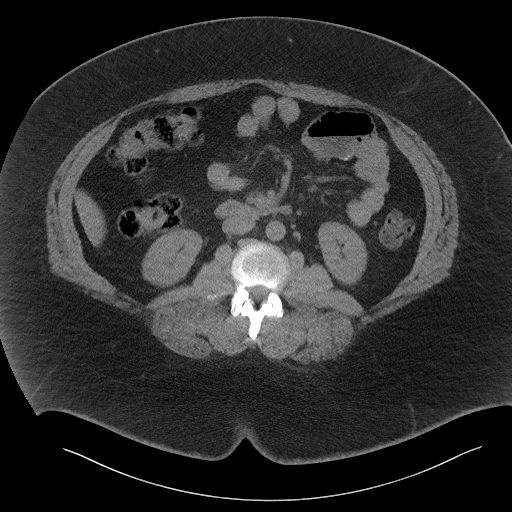
[im 63/107  soft-tissue]
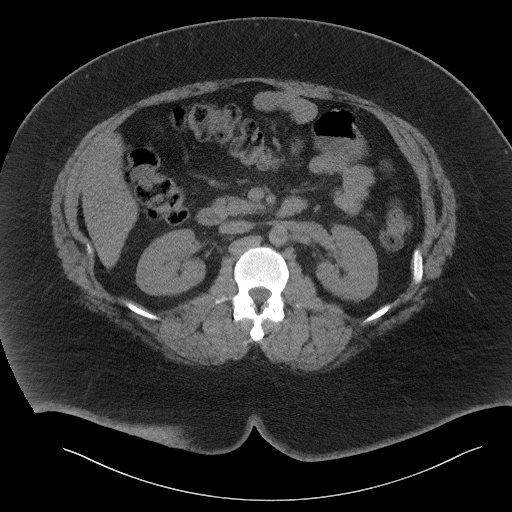
[im 63/107  bone]
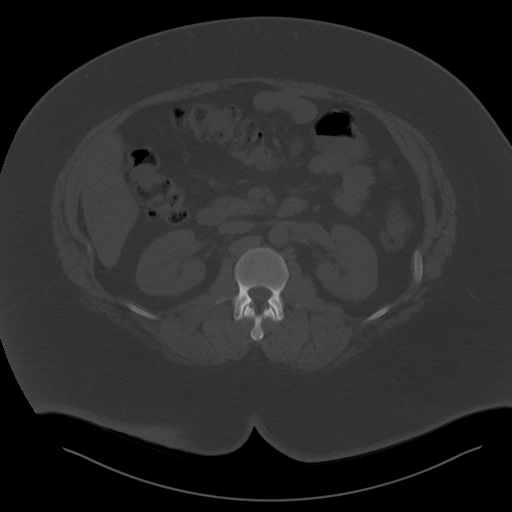
[im 69/107  soft-tissue]
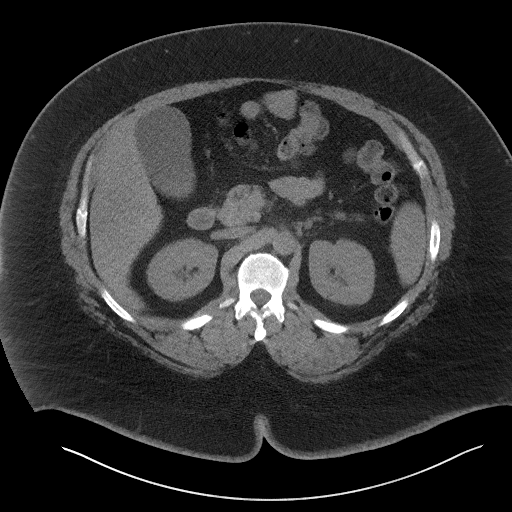
[im 82/107  soft-tissue]
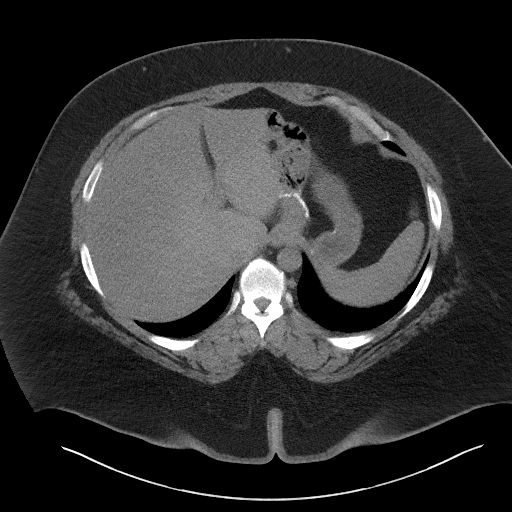
[im 88/107  soft-tissue]
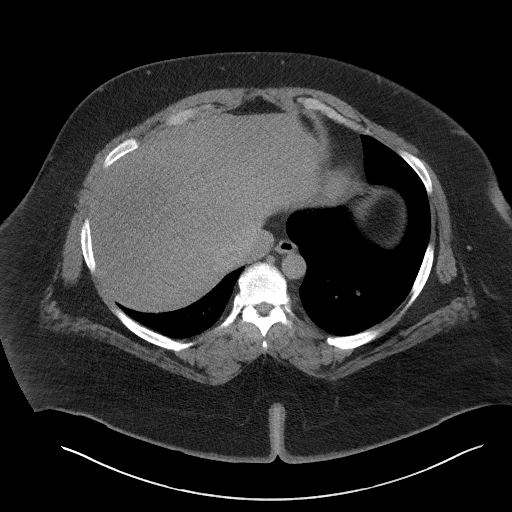
[im 94/107  soft-tissue]
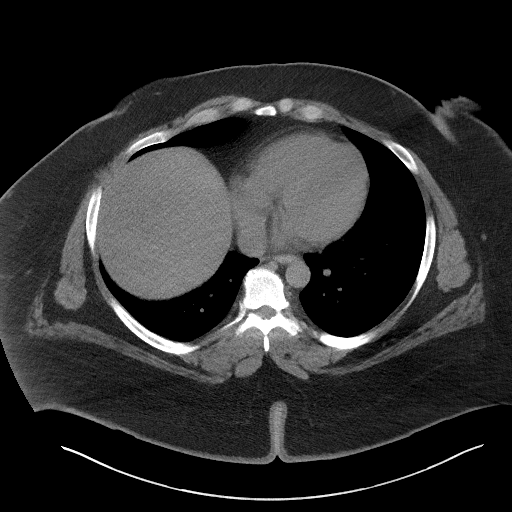
[im 100/107  soft-tissue]
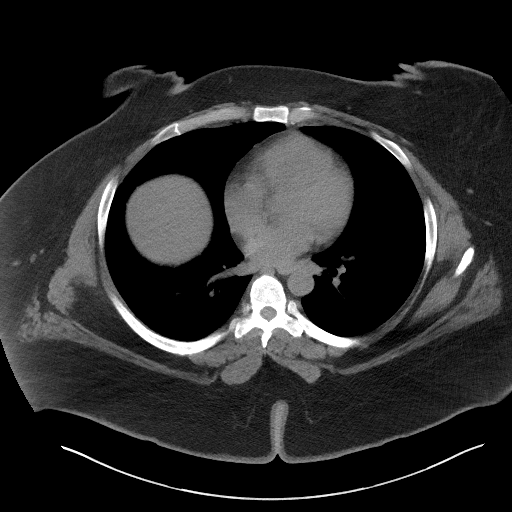

[Series 5: coronal · coronal · 0.98mm/px · 3 of 178 slices shown]
[im 60/178  soft-tissue]
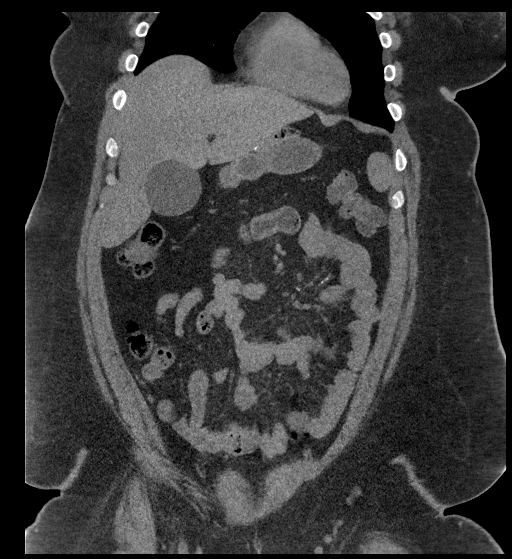
[im 79/178  soft-tissue]
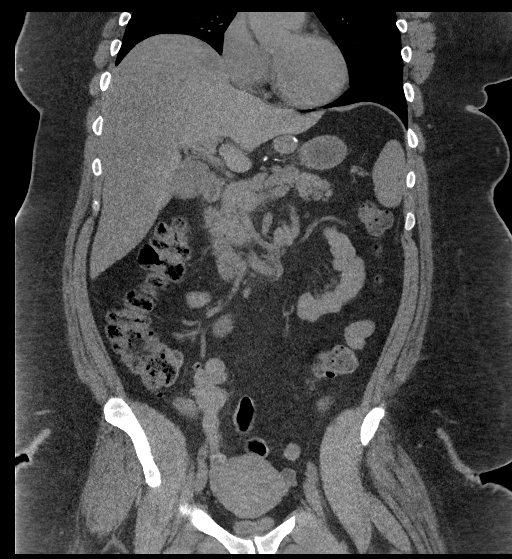
[im 99/178  soft-tissue]
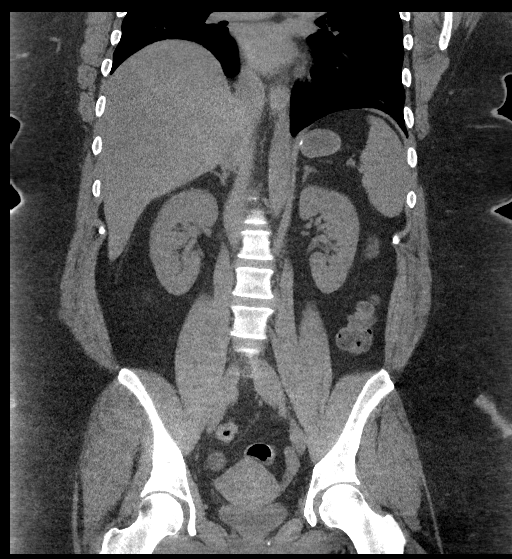

[17 of 46 positions shown; findings below may reference images not displayed]

FINDINGS: Lower chest: No acute findings.

Hepatobiliary: No masses visualized on this unenhanced exam. Diffuse
hepatic steatosis. Gallbladder is unremarkable.

Pancreas: No mass or inflammatory process visualized on this
unenhanced exam.

Spleen:  Within normal limits in size.

Adrenals/Urinary tract: 7 mm calculus in anterior midpole of left
kidney is again seen. No other renal calculi identified. No evidence
of ureteral calculi or hydronephrosis. Unremarkable unopacified
urinary bladder.

Stomach/Bowel: Stable postop changes from previous gastric bypass
surgery. No evidence of obstruction, inflammatory process, or
abnormal fluid collections.

Vascular/Lymphatic: No pathologically enlarged lymph nodes
identified. No evidence of abdominal aortic aneurysm.

Reproductive: No mass or other significant abnormality. Tubal
ligation clips again seen, with left clip again seen in anterior
left lower quadrant.

Other:  None.

Musculoskeletal:  No suspicious bone lesions identified.
IMPRESSION: 7 mm nonobstructing calculus in left kidney. No evidence of ureteral
calculi, hydronephrosis, or other acute findings.

Diffuse hepatic steatosis.

## 2019-02-10 ENCOUNTER — Other Ambulatory Visit: Payer: Self-pay | Admitting: Family Medicine

## 2019-02-10 DIAGNOSIS — G5793 Unspecified mononeuropathy of bilateral lower limbs: Secondary | ICD-10-CM

## 2019-03-15 ENCOUNTER — Other Ambulatory Visit: Payer: Self-pay | Admitting: Family Medicine

## 2019-03-15 DIAGNOSIS — G5793 Unspecified mononeuropathy of bilateral lower limbs: Secondary | ICD-10-CM

## 2019-04-29 ENCOUNTER — Other Ambulatory Visit: Payer: Self-pay

## 2019-04-29 ENCOUNTER — Ambulatory Visit (INDEPENDENT_AMBULATORY_CARE_PROVIDER_SITE_OTHER): Payer: Medicaid Other | Admitting: *Deleted

## 2019-04-29 DIAGNOSIS — Z23 Encounter for immunization: Secondary | ICD-10-CM | POA: Diagnosis present

## 2019-05-21 ENCOUNTER — Other Ambulatory Visit: Payer: Self-pay | Admitting: Family Medicine

## 2019-05-21 DIAGNOSIS — G5793 Unspecified mononeuropathy of bilateral lower limbs: Secondary | ICD-10-CM

## 2019-06-18 ENCOUNTER — Other Ambulatory Visit: Payer: Self-pay | Admitting: Family Medicine

## 2019-06-18 DIAGNOSIS — G5793 Unspecified mononeuropathy of bilateral lower limbs: Secondary | ICD-10-CM

## 2019-06-25 ENCOUNTER — Other Ambulatory Visit: Payer: Self-pay

## 2019-07-09 ENCOUNTER — Ambulatory Visit (HOSPITAL_COMMUNITY)
Admission: RE | Admit: 2019-07-09 | Discharge: 2019-07-09 | Disposition: A | Discharge status: Home / Self Care | Payer: Medicaid Other | Source: Ambulatory Visit | Attending: Family Medicine | Admitting: Family Medicine

## 2019-07-09 ENCOUNTER — Other Ambulatory Visit: Payer: Self-pay

## 2019-07-09 ENCOUNTER — Encounter: Payer: Self-pay | Admitting: Family Medicine

## 2019-07-09 ENCOUNTER — Ambulatory Visit (INDEPENDENT_AMBULATORY_CARE_PROVIDER_SITE_OTHER): Payer: Medicaid Other | Admitting: Family Medicine

## 2019-07-09 VITALS — BP 136/84 | HR 114 | Wt >= 6400 oz

## 2019-07-09 DIAGNOSIS — R011 Cardiac murmur, unspecified: Secondary | ICD-10-CM | POA: Diagnosis not present

## 2019-07-09 DIAGNOSIS — Z9884 Bariatric surgery status: Secondary | ICD-10-CM | POA: Diagnosis not present

## 2019-07-09 DIAGNOSIS — R5383 Other fatigue: Secondary | ICD-10-CM

## 2019-07-09 DIAGNOSIS — Z0000 Encounter for general adult medical examination without abnormal findings: Secondary | ICD-10-CM | POA: Diagnosis not present

## 2019-07-09 DIAGNOSIS — Z23 Encounter for immunization: Secondary | ICD-10-CM | POA: Diagnosis not present

## 2019-07-09 DIAGNOSIS — R000 Tachycardia, unspecified: Secondary | ICD-10-CM | POA: Diagnosis not present

## 2019-07-09 DIAGNOSIS — D649 Anemia, unspecified: Secondary | ICD-10-CM

## 2019-07-09 DIAGNOSIS — R7303 Prediabetes: Secondary | ICD-10-CM | POA: Diagnosis not present

## 2019-07-09 LAB — POCT GLYCOSYLATED HEMOGLOBIN (HGB A1C): HbA1c, POC (controlled diabetic range): 6.3 % (ref 0.0–7.0)

## 2019-07-09 NOTE — Patient Instructions (Signed)
It was a pleasure to see you today! Thank you for choosing [facility] for your primary care. Karen Brennan was seen for physical and fatigue. Come back to the clinic for your Pap smear and to discuss the results of her cardiology work-up after they are done.  Today we talked about your increasing fatigue with activity, we are going to send you to cardiology because there is a concern based off your complaints that there might be a cardiac contribution to this.  We are hoping to get you an appointment within a week or so, if you have not heard from somebody within a week please call back to our office so that we can track this down.  It is also important that you reschedule for your Pap smear.  We will call you if there is anything significant found in the blood work that we do today.  If you get new significant chest pain it is important that you go to the emergency department immediately, we are advising that you not start a new exercise regimen during the next week or so until you speak to the cardiologist.   Please bring all your medications to every doctors visit   Sign up for My Chart to have easy access to your labs results, and communication with your Primary care physician.     Please check-out at the front desk before leaving the clinic.     Best,  Dr. Marthenia Rolling FAMILY MEDICINE RESIDENT - PGY3 07/09/2019 2:39 PM

## 2019-07-09 NOTE — Progress Notes (Signed)
Subjective:  Karen Brennan is a 44 y.o. female who presents to the [facility] today with a chief complaint of routine checkup.   HPI: Systolic murmur/tachycardia/fatigue/PVCs on ECG Increasing exercise intolerance, history of tachycardia, obesity status post gastric bypass.  One episode of exercise time to chest pain months ago which resolved spontaneously.  No specific shortness of breath but feels "wiped out "and finds that she can no longer do the activities that she used to do because she gets too tired.  Anemia History of fatigue with minor anemia in the past,No complaint of bleeding  Prediabetes Still in prediabetic range, patient has been struggling with weight status post gastric bypass, would like to start medication in the hopes that it will help   Objective:  Physical Exam: BP 136/84   Pulse (!) 114   Wt (!) 445 lb 12.8 oz (202.2 kg)   SpO2 98%   BMI 57.24 kg/m   Gen: NAD, conversing comfortably CV: Regular rhythm with a rate between 101 15 depending on time of checking.  PVCs on ECG sinus tach otherwise, new systolic murmur Pulm: NWOB, CTAB with no crackles, wheezes, or rhonchi MSK: no edema, cyanosis, or clubbing noted Skin: warm, dry Neuro: grossly normal, moves all extremities Psych: Normal affect and thought content  Results for orders placed or performed in visit on 07/09/19 (from the past 72 hour(s))  HgB A1c     Status: None   Collection Time: 07/09/19  1:38 PM  Result Value Ref Range   Hemoglobin A1C     HbA1c POC (<> result, manual entry)     HbA1c, POC (prediabetic range)     HbA1c, POC (controlled diabetic range) 6.3 0.0 - 7.0 %  Comprehensive metabolic panel     Status: Abnormal   Collection Time: 07/09/19  3:23 PM  Result Value Ref Range   Glucose 106 (H) 65 - 99 mg/dL   BUN 12 6 - 24 mg/dL   Creatinine, Ser 6.71 0.57 - 1.00 mg/dL   GFR calc non Af Amer 73 >59 mL/min/1.73   GFR calc Af Amer 84 >59 mL/min/1.73   BUN/Creatinine Ratio 13 9 - 23   Sodium 140 134 - 144 mmol/L   Potassium 4.5 3.5 - 5.2 mmol/L   Chloride 100 96 - 106 mmol/L   CO2 26 20 - 29 mmol/L   Calcium 9.1 8.7 - 10.2 mg/dL   Total Protein 7.4 6.0 - 8.5 g/dL   Albumin 4.2 3.8 - 4.8 g/dL   Globulin, Total 3.2 1.5 - 4.5 g/dL   Albumin/Globulin Ratio 1.3 1.2 - 2.2   Bilirubin Total 0.4 0.0 - 1.2 mg/dL   Alkaline Phosphatase 80 39 - 117 IU/L   AST 19 0 - 40 IU/L   ALT 16 0 - 32 IU/L  Pathologist smear review     Status: Abnormal (Preliminary result)   Collection Time: 07/09/19  3:25 PM  Result Value Ref Range   Path Rev WBC Comment     Comment: Specimen has been received and testing has been initiated.   Path Rev RBC WILL FOLLOW    Path Rev PLTs WILL FOLLOW    PATH INTERP BLD-IMP WILL FOLLOW    PATHOLOGIST NAME WILL FOLLOW    WBC 9.0 3.4 - 10.8 x10E3/uL   RBC 4.81 3.77 - 5.28 x10E6/uL   Hemoglobin 11.8 11.1 - 15.9 g/dL   Hematocrit 24.5 80.9 - 46.6 %   MCV 79 79 - 97 fL   MCH 24.5 (  L) 26.6 - 33.0 pg   MCHC 30.9 (L) 31.5 - 35.7 g/dL   RDW 13.2 11.7 - 15.4 %   Platelets 483 (H) 150 - 450 x10E3/uL   Neutrophils 49 Not Estab. %   Lymphs 44 Not Estab. %   Monocytes 6 Not Estab. %   Eos 1 Not Estab. %   Basos 0 Not Estab. %   Neutrophils Absolute 4.4 1.4 - 7.0 x10E3/uL   Lymphocytes Absolute 3.9 (H) 0.7 - 3.1 x10E3/uL   Monocytes Absolute 0.5 0.1 - 0.9 x10E3/uL   EOS (ABSOLUTE) 0.1 0.0 - 0.4 x10E3/uL   Basophils Absolute 0.0 0.0 - 0.2 x10E3/uL   Immature Granulocytes 0 Not Estab. %   Immature Grans (Abs) 0.0 0.0 - 0.1 x10E3/uL     Assessment/Plan:  Systolic murmur Increasing exercise intolerance with new systolic murmur, history of tachycardia, obesity, and PVCs with ECG.  One episode of exercise time to chest pain months ago which resolved spontaneously.  We will send to cardiology for evaluation for potential functional echo.  Tachycardia Increasing exercise intolerance with new systolic murmur, history of tachycardia, obesity, and PVCs with ECG.   One episode of exercise time to chest pain months ago which resolved spontaneously.  We will send to cardiology for evaluation for potential functional echo.  Healthcare maintenance Pneumococcal 23  Fatigue Increasing exercise intolerance with new systolic murmur, history of tachycardia, obesity, and PVCs with ECG.  One episode of exercise time to chest pain months ago which resolved spontaneously.  We will send to cardiology for evaluation for potential functional echo.  Anemia History of fatigue with minor anemia in the past, hemoglobin stable with smear still pending at time of writing this note.  No complaint of bleeding  Prediabetes Still in prediabetic range, as decided she would like to start Metformin 500 ER.   Sherene Sires, DO FAMILY MEDICINE RESIDENT - PGY3 07/10/2019 5:54 PM

## 2019-07-10 ENCOUNTER — Telehealth: Payer: Self-pay | Admitting: Family Medicine

## 2019-07-10 DIAGNOSIS — R000 Tachycardia, unspecified: Secondary | ICD-10-CM | POA: Insufficient documentation

## 2019-07-10 DIAGNOSIS — R011 Cardiac murmur, unspecified: Secondary | ICD-10-CM | POA: Insufficient documentation

## 2019-07-10 DIAGNOSIS — Z9884 Bariatric surgery status: Secondary | ICD-10-CM | POA: Insufficient documentation

## 2019-07-10 LAB — COMPREHENSIVE METABOLIC PANEL
ALT: 16 IU/L (ref 0–32)
AST: 19 IU/L (ref 0–40)
Albumin/Globulin Ratio: 1.3 (ref 1.2–2.2)
Albumin: 4.2 g/dL (ref 3.8–4.8)
Alkaline Phosphatase: 80 IU/L (ref 39–117)
BUN/Creatinine Ratio: 13 (ref 9–23)
BUN: 12 mg/dL (ref 6–24)
Bilirubin Total: 0.4 mg/dL (ref 0.0–1.2)
CO2: 26 mmol/L (ref 20–29)
Calcium: 9.1 mg/dL (ref 8.7–10.2)
Chloride: 100 mmol/L (ref 96–106)
Creatinine, Ser: 0.96 mg/dL (ref 0.57–1.00)
GFR calc Af Amer: 84 mL/min/{1.73_m2} (ref >59)
GFR calc non Af Amer: 73 mL/min/{1.73_m2} (ref >59)
Globulin, Total: 3.2 g/dL (ref 1.5–4.5)
Glucose: 106 mg/dL — ABNORMAL HIGH (ref 65–99)
Potassium: 4.5 mmol/L (ref 3.5–5.2)
Sodium: 140 mmol/L (ref 134–144)
Total Protein: 7.4 g/dL (ref 6.0–8.5)

## 2019-07-10 MED ORDER — METFORMIN HCL ER 500 MG PO TB24: 500.0000 mg | ORAL_TABLET | Freq: Every day | ORAL | 0 refills | Status: DC

## 2019-07-10 NOTE — Assessment & Plan Note (Signed)
Still in prediabetic range, as decided she would like to start Metformin 500 ER.

## 2019-07-10 NOTE — Assessment & Plan Note (Signed)
Increasing exercise intolerance with new systolic murmur, history of tachycardia, obesity, and PVCs with ECG.  One episode of exercise time to chest pain months ago which resolved spontaneously.  We will send to cardiology for evaluation for potential functional echo.

## 2019-07-10 NOTE — Telephone Encounter (Signed)
Pt is calling because she thought that the doctor was going to prescribe her Metformin. If so she needs this called in to [facility] on [address] j[address]

## 2019-07-10 NOTE — Assessment & Plan Note (Signed)
History of fatigue with minor anemia in the past, hemoglobin stable with smear still pending at time of writing this note.  No complaint of bleeding

## 2019-07-10 NOTE — Assessment & Plan Note (Signed)
Increasing exercise intolerance with new systolic murmur, history of tachycardia, obesity, and PVCs with ECG.  One episode of exercise time to chest pain months ago which resolved spontaneously.  We will send to cardiology for evaluation for potential functional echo. 

## 2019-07-10 NOTE — Assessment & Plan Note (Signed)
Pneumococcal 23 

## 2019-07-11 ENCOUNTER — Ambulatory Visit: Payer: Medicaid Other | Admitting: Family Medicine

## 2019-07-11 NOTE — Telephone Encounter (Signed)
Pt informed of below and stated that [facility] contacted her and she has appointment scheduled already.Karen Brennan, CMA

## 2019-07-12 LAB — PATHOLOGIST SMEAR REVIEW
Basophils Absolute: 0 10*3/uL (ref 0.0–0.2)
Basos: 0 %
EOS (ABSOLUTE): 0.1 10*3/uL (ref 0.0–0.4)
Eos: 1 %
Hematocrit: 38.2 % (ref 34.0–46.6)
Hemoglobin: 11.8 g/dL (ref 11.1–15.9)
Immature Grans (Abs): 0 10*3/uL (ref 0.0–0.1)
Immature Granulocytes: 0 %
Lymphocytes Absolute: 3.9 10*3/uL — ABNORMAL HIGH (ref 0.7–3.1)
Lymphs: 44 %
MCH: 24.5 pg — ABNORMAL LOW (ref 26.6–33.0)
MCHC: 30.9 g/dL — ABNORMAL LOW (ref 31.5–35.7)
MCV: 79 fL (ref 79–97)
Monocytes Absolute: 0.5 10*3/uL (ref 0.1–0.9)
Monocytes: 6 %
Neutrophils Absolute: 4.4 10*3/uL (ref 1.4–7.0)
Neutrophils: 49 %
Platelets: 483 10*3/uL — ABNORMAL HIGH (ref 150–450)
RBC: 4.81 x10E6/uL (ref 3.77–5.28)
RDW: 13.2 % (ref 11.7–15.4)
WBC: 9 10*3/uL (ref 3.4–10.8)

## 2019-07-13 ENCOUNTER — Other Ambulatory Visit: Payer: Self-pay | Admitting: Family Medicine

## 2019-07-13 DIAGNOSIS — R5383 Other fatigue: Secondary | ICD-10-CM

## 2019-07-13 DIAGNOSIS — D72820 Lymphocytosis (symptomatic): Secondary | ICD-10-CM

## 2019-07-13 NOTE — Progress Notes (Signed)
Please call patient, "Dr. Parke Simmers wanted you to know that your labs didn't show anything that immediately concerning and that the next step would be to repeat the blood test (CBC) in about a month.  Please don't forget to followthrough on anything your cardiologist wants to check up on"  -Dr. Parke Simmers

## 2019-07-13 NOTE — Progress Notes (Signed)
Cardiology Office Note:    Date:  07/14/2019   ID:  BRYSTAL KILDOW, DOB Sep 30, 1975, MRN [id number]  PCP:  Sherene Sires, DO  Cardiologist:  No primary care provider on file.  Electrophysiologist:  None   Referring MD: Dickie La, MD   Chief Complaint  Patient presents with  . Shortness of Breath    History of Present Illness:    Karen Brennan is a 44 y.o. female with a hx of bipolar disorder, hypertension, prediabetes, anemia, obesity status post gastric bypass who is referred by Dr. Nori Riis for an initial evaluation of tachycardia.  She was seen by Dr. Criss Rosales and Dr. Nori Riis on 07/09/2019, reported worsening exercise intolerance and episodes of tachycardia.  Reports that she has episodes where she feels like her heart is racing, will last about 5 minutes and resolved.  Happening about once per month or so.  She reports that she does exercise videos and walks, but becomes fatigued after a few minutes.  States that she gets short of breath with minimal exertion.  She does report that she gets occasional chest pain, which she describes as aching pain in center of her chest.  Episodes occur at rest, denies any chest pain when walking or doing her exercise videos.  Pain lasts a few minutes and resolves.  Smoked 1ppd x 2 years, quit in 2006.  Mother had MI in 32s.  Reports BP has been under good control.  Recently precibed metformin, has not started.    Past Medical History:  Diagnosis Date  . ANEMIA 08/14/2008   Qualifier: Diagnosis of  By: Genene Churn MD, Janett Billow    . Anxiety   . Bipolar 1 disorder ([place])   . Blood transfusion   . Depression   . GERD (gastroesophageal reflux disease)   . History of bronchitis   . Hypertension   . Kidney stones 10/09/11  . Migraines 10/09/11   "often"    Past Surgical History:  Procedure Laterality Date  . ANKLE FRACTURE SURGERY   1990's   "left; put screws in"  . ENDOMETRIAL ABLATION  ~ 2011  . FRACTURE SURGERY    . GASTRIC BY PASS    . TUBAL LIGATION  2006     Current Medications: Current Meds  Medication Sig  . ACCU-CHEK FASTCLIX LANCETS MISC Check blood sugar once daily, alternating before breakfast and an hour after largest meal of day.  Marland Kitchen atorvastatin (LIPITOR) 20 MG tablet Take 1 tablet (20 mg total) by mouth daily.  . blood glucose meter kit and supplies KIT Dispense based on patient and insurance preference. Use up to four times daily as directed. (FOR ICD-9 250.00, 250.01).  . Blood Glucose Monitoring Suppl (ACCU-CHEK AVIVA PLUS) w/Device KIT USE AS DIRECTED*NOT COVERED*  . FLUoxetine (PROZAC) 40 MG capsule Take 40 mg by mouth daily.  Marland Kitchen gabapentin (NEURONTIN) 300 MG capsule TAKE 1 CAPSULE (300 MG TOTAL) BY MOUTH 3 (THREE) TIMES DAILY FOR 30 DAYS.  Marland Kitchen glucose blood (ACCU-CHEK AVIVA PLUS) test strip Check blood sugar once daily.  Marland Kitchen glucose blood (ACCU-CHEK GUIDE) test strip Use as instructed  . haloperidol (HALDOL) 2 MG tablet Take 2 mg by mouth daily.  . hydrochlorothiazide (HYDRODIURIL) 25 MG tablet Take 1 tablet (25 mg total) by mouth daily.  . hydrOXYzine (ATARAX/VISTARIL) 25 MG tablet Take 50 mg by mouth at bedtime.   Marland Kitchen ibuprofen (ADVIL,MOTRIN) 600 MG tablet Take 1 tablet (600 mg total) by mouth every 6 (six) hours as needed.  Elmore Guise  Devices (ACCU-CHEK SOFTCLIX) lancets Use as instructed  . Lancets (ACCU-CHEK MULTICLIX) lancets Check sugar daily  . metFORMIN (GLUCOPHAGE-XR) 500 MG 24 hr tablet Take 1 tablet (500 mg total) by mouth daily with breakfast.  . OLANZapine (ZYPREXA) 20 MG tablet Take 20 mg by mouth at bedtime.   . valsartan (DIOVAN) 40 MG tablet Take 1 tablet (40 mg total) by mouth at bedtime.     Allergies:   Sibutramine hcl monohydrate   Social History   Socioeconomic History  . Marital status: Single    Spouse name: Not on file  . Number of children: Not on file  . Years of education: Not on file  . Highest education level: Not on file  Occupational History  . Not on file  Tobacco Use  . Smoking status:  Former Smoker    Packs/day: 0.25    Years: 4.00    Pack years: 1.00    Types: Cigarettes    Quit date: 06/12/2004    Years since quitting: 15.0  . Smokeless tobacco: Never Used  Substance and Sexual Activity  . Alcohol use: No  . Drug use: No  . Sexual activity: Not on file    Comment: Novasure  Other Topics Concern  . Not on file  Social History Narrative  . Not on file   Social Determinants of Health   Financial Resource Strain:   . Difficulty of Paying Living Expenses: Not on file  Food Insecurity:   . Worried About Charity fundraiser in the Last Year: Not on file  . Ran Out of Food in the Last Year: Not on file  Transportation Needs:   . Lack of Transportation (Medical): Not on file  . Lack of Transportation (Non-Medical): Not on file  Physical Activity:   . Days of Exercise per Week: Not on file  . Minutes of Exercise per Session: Not on file  Stress:   . Feeling of Stress : Not on file  Social Connections:   . Frequency of Communication with Friends and Family: Not on file  . Frequency of Social Gatherings with Friends and Family: Not on file  . Attends Religious Services: Not on file  . Active Member of Clubs or Organizations: Not on file  . Attends Archivist Meetings: Not on file  . Marital Status: Not on file     Family History: Mother had MI in 27s  ROS:   Please see the history of present illness.     All other systems reviewed and are negative.  EKGs/Labs/Other Studies Reviewed:    The following studies were reviewed today:   EKG:  EKG is ordered today.  The ekg ordered today demonstrates sinus tachycardia, rate 183, nonspecific T wave flattening  Recent Labs: 07/09/2019: ALT 16; BUN 12; Creatinine, Ser 0.96; Hemoglobin 11.8; Platelets 483; Potassium 4.5; Sodium 140  Recent Lipid Panel    Component Value Date/Time   CHOL 152 08/31/2016 1212   TRIG 97 08/31/2016 1212   HDL 44 08/31/2016 1212   CHOLHDL 3.5 08/31/2016 1212   CHOLHDL  3.8 12/09/2014 1102   VLDL 25 12/09/2014 1102   LDLCALC 89 08/31/2016 1212    Physical Exam:    VS:  BP (!) 132/95   Pulse (!) 103   Temp (!) 95.4 F (35.2 C)   Ht 6' 2" (1.88 m)   Wt (!) 443 lb 3.2 oz (201 kg)   SpO2 100%   BMI 56.90 kg/m     Wt  Readings from Last 3 Encounters:  07/14/19 (!) 443 lb 3.2 oz (201 kg)  07/09/19 (!) 445 lb 12.8 oz (202.2 kg)  07/09/18 (!) 448 lb (203.2 kg)     GEN:   in no acute distress HEENT: Normal NECK: No JVD LYMPHATICS: No lymphadenopathy CARDIAC:tachycardic, irregular, no murmurs, rubs, gallops RESPIRATORY:  Clear to auscultation without rales, wheezing or rhonchi  ABDOMEN: Soft, non-tender, non-distended MUSCULOSKELETAL:  No edema; No deformity  SKIN: Warm and dry NEUROLOGIC:  Alert and oriented x 3 PSYCHIATRIC:  Normal affect   ASSESSMENT:    1. Shortness of breath   2. Palpitations   3. Chest pain of uncertain etiology   4. Essential hypertension   5. Hyperlipidemia, unspecified hyperlipidemia type    PLAN:    In order of problems listed above:  Palpitations: Describes episodes where it feels like heart is racing at rest, concerning for arrhythmia.  Will check Zio patch x2 weeks  Dyspnea on exertion: Suspect deconditioning is contributing.  Will check TTE to evaluate for structural heart disease  Chest pain: Description suggests noncardiac chest pain, as describes  dull aching pain not related to exertion  Hypertension: Continue valsartan 40 mg daily and hydrochlorothiazide 25 mg daily.   Hyperlipidemia: On atorvastatin 20 mg daily.  Last LDL 89 on 08/31/2016  Prediabetes: On Metformin.  A1c 6.2 on 02/16/2017  RTC in 3 months  Medication Adjustments/Labs and Tests Ordered: Current medicines are reviewed at length with the patient today.  Concerns regarding medicines are outlined above.  Orders Placed This Encounter  Procedures  . LONG TERM MONITOR (3-14 DAYS)  . EKG 12-Lead  . ECHOCARDIOGRAM COMPLETE   No orders  of the defined types were placed in this encounter.   Patient Instructions  Medication Instructions:  Your physician recommends that you continue on your current medications as directed. Please refer to the Current Medication list given to you today.  *If you need a refill on your cardiac medications before your next appointment, please call your pharmacy*  Lab Work: NONE  Testing/Procedures: Your physician has requested that you have an echocardiogram. Echocardiography is a painless test that uses sound waves to create images of your heart. It provides your doctor with information about the size and shape of your heart and how well your heart's chambers and valves are working. This procedure takes approximately one hour. There are no restrictions for this procedure.  This will be done at our [facility] location:  [employer] has requested you wear your ZIO patch monitor__14___days.   This is a single patch monitor.  [employer] supplies one patch monitor per enrollment.  Additional stickers are not available.   Please do not apply patch if you will be having a Nuclear Stress Test, Echocardiogram, Cardiac CT, MRI, or Chest Xray during the time frame you would be wearing the monitor. The patch cannot be worn during these tests.  You cannot remove and re-apply the ZIO XT patch monitor.   Your ZIO patch monitor will be sent USPS Priority mail from [facility] directly to your home address. The monitor may also be mailed to a PO BOX if home delivery is not available.   It may take 3-5 days to receive your monitor after you have been enrolled.   Once you have received you monitor, please review enclosed instructions.  Your monitor has already been registered assigning a specific monitor serial #  to you.   Applying the monitor   Shave hair from upper left chest.   Hold abrader disc by orange tab.  Rub  abrader in 40 strokes over left upper chest as indicated in your monitor instructions.   Clean area with 4 enclosed alcohol pads .  Use all pads to assure are is cleaned thoroughly.  Let dry.   Apply patch as indicated in monitor instructions.  Patch will be place under collarbone on left side of chest with arrow pointing upward.   Rub patch adhesive wings for 2 minutes.Remove white label marked "1".  Remove white label marked "2".  Rub patch adhesive wings for 2 additional minutes.   While looking in a mirror, press and release button in center of patch.  A small green light will flash 3-4 times .  This will be your only indicator the monitor has been turned on.     Do not shower for the first 24 hours.  You may shower after the first 24 hours.   Press button if you feel a symptom. You will hear a small click.  Record Date, Time and Symptom in the Patient Log Book.   When you are ready to remove patch, follow instructions on last 2 pages of Patient Log Book.  Stick patch monitor onto last page of Patient Log Book.   Place Patient Log Book in [place] box.  Use locking tab on box and tape box closed securely.  The [employer] has [employer] on it.  Please place in mailbox as soon as possible.  Your physician should have your test results approximately 7 days after the monitor has been mailed back to [facility].   Call [place] at [phone] if you have questions regarding your ZIO XT patch monitor.  Call them immediately if you see an orange light blinking on your monitor.   If your monitor falls off in less than 4 days contact our Monitor department at [phone].  If your monitor becomes loose or falls off after 4 days call [employer] at [phone] for suggestions on securing your monitor.    Follow-Up: At [facility], you and your health needs are our priority.  As part of our continuing mission to provide you with exceptional heart care, we have  created designated Provider Care Teams.  These Care Teams include your primary Cardiologist (physician) and Advanced Practice Providers (APPs -  Physician Assistants and Nurse Practitioners) who all work together to provide you with the care you need, when you need it.  Your next appointment:   3 month(s)  The format for your next appointment:   In Person  Provider:   Oswaldo Milian, MD       Signed, Donato Heinz, MD  07/14/2019 2:06 PM    [place]

## 2019-07-14 ENCOUNTER — Encounter: Payer: Self-pay | Admitting: *Deleted

## 2019-07-14 ENCOUNTER — Ambulatory Visit (INDEPENDENT_AMBULATORY_CARE_PROVIDER_SITE_OTHER): Payer: Medicaid Other | Admitting: Cardiology

## 2019-07-14 ENCOUNTER — Other Ambulatory Visit: Payer: Self-pay

## 2019-07-14 ENCOUNTER — Encounter: Payer: Self-pay | Admitting: Cardiology

## 2019-07-14 VITALS — BP 132/95 | HR 103 | Temp 95.4°F | Ht 74.0 in | Wt >= 6400 oz

## 2019-07-14 DIAGNOSIS — R079 Chest pain, unspecified: Secondary | ICD-10-CM

## 2019-07-14 DIAGNOSIS — R0602 Shortness of breath: Secondary | ICD-10-CM

## 2019-07-14 DIAGNOSIS — R002 Palpitations: Secondary | ICD-10-CM

## 2019-07-14 DIAGNOSIS — I10 Essential (primary) hypertension: Secondary | ICD-10-CM | POA: Diagnosis not present

## 2019-07-14 DIAGNOSIS — E785 Hyperlipidemia, unspecified: Secondary | ICD-10-CM

## 2019-07-14 NOTE — Progress Notes (Signed)
Patient ID: Karen Brennan, female   DOB: [birth year], 44 y.o.   MRN: [id number] Patient enrolled for [employer] to mail a 14 day ZIO XT long term holter monitor to her home.

## 2019-07-14 NOTE — Patient Instructions (Signed)
Medication Instructions:  Your physician recommends that you continue on your current medications as directed. Please refer to the Current Medication list given to you today.  *If you need a refill on your cardiac medications before your next appointment, please call your pharmacy*  Lab Work: NONE  Testing/Procedures: Your physician has requested that you have an echocardiogram. Echocardiography is a painless test that uses sound waves to create images of your heart. It provides your doctor with information about the size and shape of your heart and how well your heart's chambers and valves are working. This procedure takes approximately one hour. There are no restrictions for this procedure.  This will be done at our [facility] location:  [facility] has requested you wear your ZIO patch monitor__14___days.   This is a single patch monitor.  [employer] supplies one patch monitor per enrollment.  Additional stickers are not available.   Please do not apply patch if you will be having a Nuclear Stress Test, Echocardiogram, Cardiac CT, MRI, or Chest Xray during the time frame you would be wearing the monitor. The patch cannot be worn during these tests.  You cannot remove and re-apply the ZIO XT patch monitor.   Your ZIO patch monitor will be sent USPS Priority mail from [facility] directly to your home address. The monitor may also be mailed to a PO BOX if home delivery is not available.   It may take 3-5 days to receive your monitor after you have been enrolled.   Once you have received you monitor, please review enclosed instructions.  Your monitor has already been registered assigning a specific monitor serial # to you.   Applying the monitor   Shave hair from upper left chest.   Hold abrader disc by orange tab.  Rub abrader in 40 strokes over left upper chest as indicated in your monitor  instructions.   Clean area with 4 enclosed alcohol pads .  Use all pads to assure are is cleaned thoroughly.  Let dry.   Apply patch as indicated in monitor instructions.  Patch will be place under collarbone on left side of chest with arrow pointing upward.   Rub patch adhesive wings for 2 minutes.Remove white label marked "1".  Remove white label marked "2".  Rub patch adhesive wings for 2 additional minutes.   While looking in a mirror, press and release button in center of patch.  A small green light will flash 3-4 times .  This will be your only indicator the monitor has been turned on.     Do not shower for the first 24 hours.  You may shower after the first 24 hours.   Press button if you feel a symptom. You will hear a small click.  Record Date, Time and Symptom in the Patient Log Book.   When you are ready to remove patch, follow instructions on last 2 pages of Patient Log Book.  Stick patch monitor onto last page of Patient Log Book.   Place Patient Log Book in [place] box.  Use locking tab on box and tape box closed securely.  The [employer] and [employer] has [employer] on it.  Please place in mailbox as soon as possible.  Your physician should have your test results approximately 7 days after the monitor has been mailed back to [facility].   Call [place] at [phone] if  you have questions regarding your ZIO XT patch monitor.  Call them immediately if you see an orange light blinking on your monitor.   If your monitor falls off in less than 4 days contact our Monitor department at [phone].  If your monitor becomes loose or falls off after 4 days call [employer] at [phone] for suggestions on securing your monitor.    Follow-Up: At [facility], you and your health needs are our priority.  As part of our continuing mission to provide you with exceptional heart care, we have created designated Provider Care Teams.  These Care Teams include your  primary Cardiologist (physician) and Advanced Practice Providers (APPs -  Physician Assistants and Nurse Practitioners) who all work together to provide you with the care you need, when you need it.  Your next appointment:   3 month(s)  The format for your next appointment:   In Person  Provider:   Epifanio Lesches, MD

## 2019-07-14 NOTE — Progress Notes (Signed)
Spoke to pt. Pt informed with good understanding. Sunday Spillers, CMA

## 2019-07-24 ENCOUNTER — Other Ambulatory Visit: Payer: Self-pay | Admitting: Family Medicine

## 2019-07-24 DIAGNOSIS — G5793 Unspecified mononeuropathy of bilateral lower limbs: Secondary | ICD-10-CM

## 2019-07-25 ENCOUNTER — Other Ambulatory Visit: Payer: Self-pay

## 2019-07-25 ENCOUNTER — Ambulatory Visit (HOSPITAL_COMMUNITY): Payer: Medicaid Other | Attending: Internal Medicine

## 2019-07-25 DIAGNOSIS — R0602 Shortness of breath: Secondary | ICD-10-CM

## 2019-07-26 ENCOUNTER — Ambulatory Visit (INDEPENDENT_AMBULATORY_CARE_PROVIDER_SITE_OTHER): Payer: Medicaid Other

## 2019-07-26 DIAGNOSIS — R002 Palpitations: Secondary | ICD-10-CM | POA: Diagnosis not present

## 2019-08-01 ENCOUNTER — Other Ambulatory Visit: Payer: Self-pay

## 2019-08-01 ENCOUNTER — Encounter (HOSPITAL_COMMUNITY): Payer: Self-pay

## 2019-08-01 ENCOUNTER — Ambulatory Visit: Payer: Medicaid Other | Admitting: Family Medicine

## 2019-08-01 ENCOUNTER — Emergency Department (HOSPITAL_COMMUNITY)
Admission: EM | Admit: 2019-08-01 | Discharge: 2019-08-01 | Disposition: A | Discharge status: Home / Self Care | Payer: Medicaid Other | Attending: Emergency Medicine | Admitting: Emergency Medicine

## 2019-08-01 ENCOUNTER — Emergency Department (HOSPITAL_COMMUNITY): Payer: Medicaid Other

## 2019-08-01 DIAGNOSIS — R202 Paresthesia of skin: Secondary | ICD-10-CM | POA: Insufficient documentation

## 2019-08-01 DIAGNOSIS — R0789 Other chest pain: Secondary | ICD-10-CM | POA: Insufficient documentation

## 2019-08-01 DIAGNOSIS — Z79899 Other long term (current) drug therapy: Secondary | ICD-10-CM | POA: Diagnosis not present

## 2019-08-01 DIAGNOSIS — Z87891 Personal history of nicotine dependence: Secondary | ICD-10-CM | POA: Diagnosis not present

## 2019-08-01 DIAGNOSIS — R079 Chest pain, unspecified: Secondary | ICD-10-CM

## 2019-08-01 DIAGNOSIS — Z7984 Long term (current) use of oral hypoglycemic drugs: Secondary | ICD-10-CM | POA: Diagnosis not present

## 2019-08-01 DIAGNOSIS — I10 Essential (primary) hypertension: Secondary | ICD-10-CM | POA: Diagnosis not present

## 2019-08-01 LAB — CBC
HCT: 39.4 % (ref 36.0–46.0)
Hemoglobin: 11.4 g/dL — ABNORMAL LOW (ref 12.0–15.0)
MCH: 24.2 pg — ABNORMAL LOW (ref 26.0–34.0)
MCHC: 28.9 g/dL — ABNORMAL LOW (ref 30.0–36.0)
MCV: 83.7 fL (ref 80.0–100.0)
Platelets: 462 10*3/uL — ABNORMAL HIGH (ref 150–400)
RBC: 4.71 MIL/uL (ref 3.87–5.11)
RDW: 13.6 % (ref 11.5–15.5)
WBC: 9 10*3/uL (ref 4.0–10.5)
nRBC: 0 % (ref 0.0–0.2)

## 2019-08-01 LAB — I-STAT BETA HCG BLOOD, ED (MC, WL, AP ONLY): I-stat hCG, quantitative: <5 m[IU]/mL (ref <5)

## 2019-08-01 LAB — TROPONIN I (HIGH SENSITIVITY)
Troponin I (High Sensitivity): <2 ng/L (ref <18)
Troponin I (High Sensitivity): <2 ng/L (ref <18)

## 2019-08-01 LAB — BASIC METABOLIC PANEL
Anion gap: 11 (ref 5–15)
BUN: 7 mg/dL (ref 6–20)
CO2: 26 mmol/L (ref 22–32)
Calcium: 8.8 mg/dL — ABNORMAL LOW (ref 8.9–10.3)
Chloride: 101 mmol/L (ref 98–111)
Creatinine, Ser: 0.84 mg/dL (ref 0.44–1.00)
GFR calc Af Amer: >60 mL/min (ref >60)
GFR calc non Af Amer: >60 mL/min (ref >60)
Glucose, Bld: 122 mg/dL — ABNORMAL HIGH (ref 70–99)
Potassium: 3.8 mmol/L (ref 3.5–5.1)
Sodium: 138 mmol/L (ref 135–145)

## 2019-08-01 MED ORDER — SODIUM CHLORIDE 0.9% FLUSH: 3.0000 mL | Freq: Once | INTRAVENOUS | Status: DC

## 2019-08-01 NOTE — ED Triage Notes (Signed)
Pt presents w/Left arm numbness starting 2100 last night, Left side CP starting at 0900 this am

## 2019-08-01 NOTE — ED Provider Notes (Signed)
[name] EMERGENCY DEPARTMENT Provider Note   CSN: [id number] Arrival date & time: 08/01/19  1139     History Chief Complaint  Patient presents with  . Chest Pain  . Numbness    Karen Brennan is a 44 y.o. female.  HPI Patient with chest pain.  Began in the mid chest this morning.  Dull.  Similar to previous chest pain she said.  Not exertional.  Pain is improving.  Had been seen by cardiology for this before.  Thought to be noncardiac at that time. Also had some numbness in her left arm.  States began last night while she was sitting in bed holding her grandchild.  States has numbness down the medial aspect of her left forearm going into the fourth and fifth fingers.  That is improving in the hand also still some tingling in her forearm.  No neck pain.  No trauma.  No shortness of breath.  No cough.    Past Medical History:  Diagnosis Date  . ANEMIA 08/14/2008   Qualifier: Diagnosis of  By: Genene Churn MD, Janett Billow    . Anxiety   . Bipolar 1 disorder ([place])   . Blood transfusion   . Depression   . GERD (gastroesophageal reflux disease)   . History of bronchitis   . Hypertension   . Kidney stones 10/09/11  . Migraines 10/09/11   "often"    Patient Active Problem List   Diagnosis Date Noted  . Tachycardia 07/10/2019  . Systolic murmur 16/94/5038  . H/O gastric bypass 07/10/2019  . Rash and nonspecific skin eruption 11/13/2016  . Prediabetes 06/26/2016  . Neuropathic pain of both feet 05/09/2016  . Headache 05/09/2016  . 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 Healthcare maintenance 12/09/2014  . Seborrheic dermatitis 12/05/2013  . Fatigue 01/06/2013  . Anxiety 10/25/2011  . Bipolar 1 disorder, mixed ([place]) 09/15/2011  . Insomnia 10/21/2010  . Allergic rhinitis 08/31/2008  . Anemia 08/14/2008  . Esophageal reflux 01/24/2008  . HYPERTENSION, BENIGN ESSENTIAL 08/17/2006  . Sinusitis, chronic 08/17/2006  . Morbid obesity with  BMI of 50.0-59.9, adult ([place]) 08/09/2006  . DEPRESSION, MAJOR, RECURRENT 08/09/2006    Past Surgical History:  Procedure Laterality Date  . ANKLE FRACTURE SURGERY   1990's   "left; put screws in"  . ENDOMETRIAL ABLATION  ~ 2011  . FRACTURE SURGERY    . GASTRIC BY PASS    . TUBAL LIGATION  2006     OB History   No obstetric history on file.     No family history on file.  Social History   Tobacco Use  . Smoking status: Former Smoker    Packs/day: 0.25    Years: 4.00    Pack years: 1.00    Types: Cigarettes    Quit date: 06/12/2004    Years since quitting: 15.1  . Smokeless tobacco: Never Used  Substance Use Topics  . Alcohol use: No  . Drug use: No    Home Medications Prior to Admission medications   Medication Sig Start Date End Date Taking? Authorizing Provider  acetaminophen (TYLENOL) 500 MG tablet Take 1,000 mg by mouth every 6 (six) hours as needed for mild pain.   Yes [provider]  benztropine (COGENTIN) 1 MG tablet Take 1 mg by mouth 2 (two) times daily. 06/18/19  Yes [provider]  FLUoxetine (PROZAC) 40 MG capsule Take 40 mg by mouth daily.   Yes [provider]  gabapentin (NEURONTIN)  300 MG capsule TAKE 1 CAPSULE (300 MG TOTAL) BY MOUTH 3 (THREE) TIMES DAILY FOR 30 DAYS. 07/25/19 08/24/19 Yes Bland, Scott, DO  haloperidol (HALDOL) 2 MG tablet Take 2 mg by mouth daily.   Yes [provider]  hydrochlorothiazide (HYDRODIURIL) 25 MG tablet Take 1 tablet (25 mg total) by mouth daily. 08/23/18  Yes Sherene Sires, DO  hydrOXYzine (ATARAX/VISTARIL) 25 MG tablet Take 25 mg by mouth in the morning and at bedtime.    Yes [provider]  metFORMIN (GLUCOPHAGE-XR) 500 MG 24 hr tablet Take 1 tablet (500 mg total) by mouth daily with breakfast. 07/10/19 10/08/19 Yes Sherene Sires, DO  Multiple Vitamin (MULTIVITAMIN WITH MINERALS) TABS tablet Take 1 tablet by mouth daily.   Yes [provider]  OLANZapine (ZYPREXA) 20 MG  tablet Take 20 mg by mouth at bedtime.    Yes [provider]  POTASSIUM PO Take 2 tablets by mouth daily.   Yes [provider]  traZODone (DESYREL) 50 MG tablet Take 50 mg by mouth at bedtime as needed for sleep. 04/07/19  Yes [provider]  valsartan (DIOVAN) 40 MG tablet Take 1 tablet (40 mg total) by mouth at bedtime. 11/15/18  Yes Winfrey, Alcario Drought, MD  ACCU-CHEK FASTCLIX LANCETS MISC Check blood sugar once daily, alternating before breakfast and an hour after largest meal of day. 11/30/16   Rumley, Burna Cash, DO  blood glucose meter kit and supplies KIT Dispense based on patient and insurance preference. Use up to four times daily as directed. (FOR ICD-9 250.00, 250.01). 03/11/18   Sherene Sires, DO  Blood Glucose Monitoring Suppl (ACCU-CHEK AVIVA PLUS) w/Device KIT USE AS DIRECTED*NOT COVERED* 03/23/17   Nicolette Bang, DO  glucose blood (ACCU-CHEK AVIVA PLUS) test strip Check blood sugar once daily. 05/07/17   Mayo, Pete Pelt, MD  glucose blood (ACCU-CHEK GUIDE) test strip Use as instructed 05/07/17   Mayo, Pete Pelt, MD  Lancet Devices [facility]) lancets Use as instructed 02/16/17   Nicolette Bang, DO  Lancets (ACCU-CHEK MULTICLIX) lancets Check sugar daily 02/16/17   Nicolette Bang, DO    Allergies    Sibutramine hcl monohydrate  Review of Systems   Review of Systems  Constitutional: Negative for appetite change.  HENT: Negative for congestion.   Respiratory: Negative for shortness of breath.   Cardiovascular: Positive for chest pain.  Gastrointestinal: Negative for abdominal distention.  Genitourinary: Negative for flank pain.  Musculoskeletal: Negative for back pain.  Neurological: Positive for numbness.  Psychiatric/Behavioral: Negative for confusion.    Physical Exam Updated Vital Signs BP 118/73   Pulse 86   Temp 98.4 F (36.9 C) (Oral)   Resp 20   SpO2 99%   Physical Exam Vitals and nursing note  reviewed.  Constitutional:      Appearance: She is obese.  Cardiovascular:     Rate and Rhythm: Normal rate and regular rhythm.  Pulmonary:     Effort: No tachypnea.     Breath sounds: No wheezing.  Chest:     Comments: Mild anterior chest tenderness. Abdominal:     Tenderness: There is no abdominal tenderness.  Musculoskeletal:     Right lower leg: No edema.     Left lower leg: No edema.  Skin:    General: Skin is warm.     Capillary Refill: Capillary refill takes less than 2 seconds.  Neurological:     Mental Status: She is alert.     Comments: Mild  paresthesias over medial aspect of left forearm.  Sensation grossly intact in hand.  Radial pulse intact.     ED Results / Procedures / Treatments   Labs (all labs ordered are listed, but only abnormal results are displayed) Labs Reviewed  BASIC METABOLIC PANEL - Abnormal; Notable for the following components:      Result Value   Glucose, Bld 122 (*)    Calcium 8.8 (*)    All other components within normal limits  CBC - Abnormal; Notable for the following components:   Hemoglobin 11.4 (*)    MCH 24.2 (*)    MCHC 28.9 (*)    Platelets 462 (*)    All other components within normal limits  I-STAT BETA HCG BLOOD, ED ([facility], [facility], [facility] ONLY)  TROPONIN I (HIGH SENSITIVITY)  TROPONIN I (HIGH SENSITIVITY)    EKG EKG Interpretation  Date/Time:  Friday August 01 2019 11:42:03 EST Ventricular Rate:  98 PR Interval:  160 QRS Duration: 78 QT Interval:  362 QTC Calculation: 462 R Axis:   66 Text Interpretation: Sinus rhythm with frequent Premature ventricular complexes Nonspecific T wave abnormality Prolonged QT Abnormal ECG No significant change since last tracing Confirmed by Davonna Belling [phone]) on 08/01/2019 12:07:38 PM   Radiology DG Chest 2 View  Result Date: 08/01/2019 CLINICAL DATA:  Chest pain radiating to left arm beginning this morning. EXAM: CHEST - 2 VIEW COMPARISON:  07/09/2018 FINDINGS: The heart size and  mediastinal contours are within normal limits. Both lungs are clear. Electronic device seen in the left anterior chest wall soft tissues. The visualized skeletal structures are unremarkable. IMPRESSION: No active cardiopulmonary disease. Electronically Signed   By: Marlaine Hind M.D.   On: 08/01/2019 12:11    Procedures Procedures (including critical care time)  Medications Ordered in ED Medications  sodium chloride flush (NS) 0.9 % injection 3 mL (0 mLs Intravenous Hold 08/01/19 1331)    ED Course  I have reviewed the triage vital signs and the nursing notes.  Pertinent labs & imaging results that were available during my care of the patient were reviewed by me and considered in my medical decision making (see chart for details).    MDM Rules/Calculators/A&P                      Patient with chest pain and numbness in left arm.  Chest pain doubt cardiac cause.  Has had previous work-up.  EKG and troponins reassuring.  I think low heart score.  Will discharge home.  Also paresthesia in left arm.  May be ulnar source.  Pain improved with still paresthesia.  Discharge home with PCP follow-up. Final Clinical Impression(s) / ED Diagnoses Final diagnoses:  Nonspecific chest pain  Paresthesias    Rx / DC Orders ED Discharge Orders    None       Davonna Belling, MD 08/01/19 1600

## 2019-08-01 NOTE — Discharge Instructions (Signed)
Follow-up with your doctor for the paresthesias in the left arm.  Your chest pain does not appear to be coming from your heart.

## 2019-10-17 NOTE — Progress Notes (Deleted)
Cardiology Office Note:    Date:  10/17/2019   ID:  Karen Brennan, DOB 25-Jul-1975, MRN [id number]  PCP:  Marthenia Rolling, DO  Cardiologist:  No primary care provider on file.  Electrophysiologist:  None   Referring MD: Marthenia Rolling, DO   No chief complaint on file.   History of Present Illness:    Karen Brennan is a 44 y.o. female with a hx of bipolar disorder, hypertension, prediabetes, anemia, obesity status post gastric bypass who presents for follow-up.  She was referred by Dr. Jennette Kettle for an initial evaluation of tachycardia, initially seen on 07/14/2019.  She was seen by Dr. Parke Simmers and Dr. Jennette Kettle on 07/09/2019, reported worsening exercise intolerance and episodes of tachycardia.  Reports that she has episodes where she feels like her heart is racing, will last about 5 minutes and resolved.  Happening about once per month or so.  She reports that she does exercise videos and walks, but becomes fatigued after a few minutes.  States that she gets short of breath with minimal exertion.  She does report that she gets occasional chest pain, which she describes as aching pain in center of her chest.  Episodes occur at rest, denies any chest pain when walking or doing her exercise videos.  Pain lasts a few minutes and resolves.  Smoked 1ppd x 2 years, quit in 2006.  Mother had MI in 54s.    TTE on 07/25/2019 showed normal biventricular function, no significant valvular disease.  Zio patch x14 days showed occasional PVCs (1.7% of beats).  Past Medical History:  Diagnosis Date  . ANEMIA 08/14/2008   Qualifier: Diagnosis of  By: Karn Pickler MD, Shanda Bumps    . Anxiety   . Bipolar 1 disorder ([facility])   . Blood transfusion   . Depression   . GERD (gastroesophageal reflux disease)   . History of bronchitis   . Hypertension   . Kidney stones 10/09/11  . Migraines 10/09/11   "often"    Past Surgical History:  Procedure Laterality Date  . ANKLE FRACTURE SURGERY   1990's   "left; put screws in"  . ENDOMETRIAL  ABLATION  ~ 2011  . FRACTURE SURGERY    . GASTRIC BY PASS    . TUBAL LIGATION  2006    Current Medications: No outpatient medications have been marked as taking for the 10/20/19 encounter (Appointment) with Little Ishikawa, MD.     Allergies:   Sibutramine hcl monohydrate   Social History   Socioeconomic History  . Marital status: Single    Spouse name: Not on file  . Number of children: Not on file  . Years of education: Not on file  . Highest education level: Not on file  Occupational History  . Not on file  Tobacco Use  . Smoking status: Former Smoker    Packs/day: 0.25    Years: 4.00    Pack years: 1.00    Types: Cigarettes    Quit date: 06/12/2004    Years since quitting: 15.3  . Smokeless tobacco: Never Used  Substance and Sexual Activity  . Alcohol use: No  . Drug use: No  . Sexual activity: Not on file    Comment: Novasure  Other Topics Concern  . Not on file  Social History Narrative  . Not on file   Social Determinants of Health   Financial Resource Strain:   . Difficulty of Paying Living Expenses:   Food Insecurity:   . Worried About Radiation protection practitioner  of Food in the Last Year:   . [place] in the Last Year:   Transportation Needs:   . Lack of Transportation (Medical):   Marland Kitchen Lack of Transportation (Non-Medical):   Physical Activity:   . Days of Exercise per Week:   . Minutes of Exercise per Session:   Stress:   . Feeling of Stress :   Social Connections:   . Frequency of Communication with Friends and Family:   . Frequency of Social Gatherings with Friends and Family:   . Attends Religious Services:   . Active Member of Clubs or Organizations:   . Attends Archivist Meetings:   Marland Kitchen Marital Status:      Family History: Mother had MI in 33s  ROS:   Please see the history of present illness.     All other systems reviewed and are negative.  EKGs/Labs/Other Studies Reviewed:    The following studies were reviewed  today:   EKG:  EKG is ordered today.  The ekg ordered today demonstrates sinus tachycardia, rate 183, nonspecific T wave flattening  TTE 07/25/19: 1. Left ventricular ejection fraction, by estimation, is 60 to 65%. The  left ventricle has normal function. The left ventricle has no regional  wall motion abnormalities. Left ventricular diastolic parameters are  indeterminate.  2. Right ventricular systolic function is normal. The right ventricular  size is normal. Tricuspid regurgitation signal is inadequate for assessing  PA pressure.  3. The mitral valve is normal in structure and function. Trivial mitral  valve regurgitation. No evidence of mitral stenosis.  4. The aortic valve was not well visualized for morphology but is grossly  normal in function. Aortic valve regurgitation is not visualized. No  aortic stenosis is present.   Cardiac monitor 08/21/19:  No significant arrhythmias detected  Occasional PVCs (1.7% of beats). Longest bigeminy episode lasted 12 seconds, and longest trigeminy episode lasted 5 seconds.  Patient triggered events corresponded to sinus rhythm +/- PVCs   14 days of data recorded on Zio monitor. Patient had a min HR of 60 bpm, max HR of 171 bpm, and avg HR of 85 bpm. Predominant underlying rhythm was Sinus Rhythm. No VT, SVT, atrial fibrillation, high degree block, or pauses noted. Isolated atrial ectopy was rare (<1%).  Isolated ventricular ectopy was occasional (1.7%).   Longest bigeminy episode lasted 12 seconds, and longest trigeminy episode lasted 5 seconds.  There were 11 triggered events, corresponding to sinus rhythm +/- PVCs. No significant arrhythmias detected.  Recent Labs: 07/09/2019: ALT 16 08/01/2019: BUN 7; Creatinine, Ser 0.84; Hemoglobin 11.4; Platelets 462; Potassium 3.8; Sodium 138  Recent Lipid Panel    Component Value Date/Time   CHOL 152 08/31/2016 1212   TRIG 97 08/31/2016 1212   HDL 44 08/31/2016 1212   CHOLHDL 3.5 08/31/2016  1212   CHOLHDL 3.8 12/09/2014 1102   VLDL 25 12/09/2014 1102   LDLCALC 89 08/31/2016 1212    Physical Exam:    VS:  There were no vitals taken for this visit.    Wt Readings from Last 3 Encounters:  07/14/19 (!) 443 lb 3.2 oz (201 kg)  07/09/19 (!) 445 lb 12.8 oz (202.2 kg)  07/09/18 (!) 448 lb (203.2 kg)     GEN:   in no acute distress HEENT: Normal NECK: No JVD LYMPHATICS: No lymphadenopathy CARDIAC:tachycardic, irregular, no murmurs, rubs, gallops RESPIRATORY:  Clear to auscultation without rales, wheezing or rhonchi  ABDOMEN: Soft, non-tender, non-distended MUSCULOSKELETAL:  No edema;  No deformity  SKIN: Warm and dry NEUROLOGIC:  Alert and oriented x 3 PSYCHIATRIC:  Normal affect   ASSESSMENT:    No diagnosis found. PLAN:    In order of problems listed above:  Palpitations: Describes episodes where it feels like heart is racing at rest, concerning for arrhythmia.  Zio patch x14 days showed no significant arrhythmias.  Did have occasional PVCs (1.7% of beats).  Dyspnea on exertion: Suspect deconditioning is contributing.  No structural heart disease on TTE  Chest pain: Description suggests noncardiac chest pain, as describes  dull aching pain not related to exertion  Hypertension: Continue valsartan 40 mg daily and hydrochlorothiazide 25 mg daily.   Hyperlipidemia: On atorvastatin 20 mg daily.  Last LDL 89 on 08/31/2016  Prediabetes: On Metformin.  A1c 6.2 on 02/16/2017  RTC in***  Medication Adjustments/Labs and Tests Ordered: Current medicines are reviewed at length with the patient today.  Concerns regarding medicines are outlined above.  No orders of the defined types were placed in this encounter.  No orders of the defined types were placed in this encounter.   There are no Patient Instructions on file for this visit.   Signed, Little Ishikawa, MD  10/17/2019 9:07 PM    [facility]

## 2019-10-20 ENCOUNTER — Ambulatory Visit: Payer: Medicaid Other | Admitting: Cardiology

## 2019-10-24 ENCOUNTER — Other Ambulatory Visit: Payer: Self-pay | Admitting: Family Medicine

## 2019-10-24 DIAGNOSIS — I10 Essential (primary) hypertension: Secondary | ICD-10-CM

## 2019-10-31 ENCOUNTER — Other Ambulatory Visit: Payer: Self-pay

## 2019-10-31 ENCOUNTER — Encounter: Payer: Self-pay | Admitting: Cardiology

## 2019-10-31 ENCOUNTER — Ambulatory Visit (INDEPENDENT_AMBULATORY_CARE_PROVIDER_SITE_OTHER): Payer: Medicaid Other | Admitting: Cardiology

## 2019-10-31 VITALS — BP 122/88 | HR 86 | Temp 97.9°F | Ht 74.0 in | Wt >= 6400 oz

## 2019-10-31 DIAGNOSIS — R002 Palpitations: Secondary | ICD-10-CM

## 2019-10-31 DIAGNOSIS — R400 Somnolence: Secondary | ICD-10-CM

## 2019-10-31 DIAGNOSIS — I493 Ventricular premature depolarization: Secondary | ICD-10-CM | POA: Diagnosis not present

## 2019-10-31 DIAGNOSIS — I10 Essential (primary) hypertension: Secondary | ICD-10-CM

## 2019-10-31 DIAGNOSIS — Z13220 Encounter for screening for lipoid disorders: Secondary | ICD-10-CM

## 2019-10-31 DIAGNOSIS — R079 Chest pain, unspecified: Secondary | ICD-10-CM

## 2019-10-31 NOTE — Progress Notes (Signed)
Cardiology Office Note:    Date:  10/31/2019   ID:  Karen Brennan, DOB 06/05/1976, MRN [id number]  PCP:  Sherene Sires, DO  Cardiologist:  No primary care provider on file.  Electrophysiologist:  None   Referring MD: Sherene Sires, DO   Chief Complaint  Patient presents with  . Tachycardia    History of Present Illness:    Karen Brennan is a 44 y.o. female with a hx of bipolar disorder, hypertension, prediabetes, anemia, obesity status post gastric bypass who presents for follow-up.  She was referred by Dr. Nori Riis for an initial evaluation of tachycardia, initially seen on 07/14/2019.  She was seen by Dr. Criss Rosales and Dr. Nori Riis on 07/09/2019, reported worsening exercise intolerance and episodes of tachycardia.  Reports that she has episodes where she feels like her heart is racing, will last about 5 minutes and resolve.  Happening about once per month or so.  She reports that she does exercise videos and walks, but becomes fatigued after a few minutes.  States that she gets short of breath with minimal exertion.  She does report that she gets occasional chest pain, which she describes as aching pain in center of her chest.  Episodes occur at rest, denies any chest pain when walking or doing her exercise videos.  Pain lasts a few minutes and resolves.  Smoked 1ppd x 2 years, quit in 2006.  Mother had MI in 31s.    TTE on 07/25/2019 showed normal biventricular function, no significant valvular disease.  Zio patch x14 days showed occasional PVCs (1.7% of beats).  Since her last clinic visit, she reports that her main complaint is that she feels tired and weak.  Denies any further episodes of chest pain.  Does report she gets dyspnea on exertion.  Has been exercising by doing exercise videos 2-3 times a week.  Also will go for walks for 5 to 10 minutes but tires out easily.  Does report some dizziness, which she has attributed to when she takes her medications for bipolar disorder.  No alcohol use.  Reports  drinks caffeinated soda about twice per week and coffee about twice per week.  States that she was tested for OSA years ago but has not been tested recently.  Past Medical History:  Diagnosis Date  . ANEMIA 08/14/2008   Qualifier: Diagnosis of  By: Genene Churn MD, Janett Billow    . Anxiety   . Bipolar 1 disorder ([place])   . Blood transfusion   . Depression   . GERD (gastroesophageal reflux disease)   . History of bronchitis   . Hypertension   . Kidney stones 10/09/11  . Migraines 10/09/11   "often"    Past Surgical History:  Procedure Laterality Date  . ANKLE FRACTURE SURGERY   1990's   "left; put screws in"  . ENDOMETRIAL ABLATION  ~ 2011  . FRACTURE SURGERY    . GASTRIC BY PASS    . TUBAL LIGATION  2006    Current Medications: Current Meds  Medication Sig  . ACCU-CHEK FASTCLIX LANCETS MISC Check blood sugar once daily, alternating before breakfast and an hour after largest meal of day.  Marland Kitchen acetaminophen (TYLENOL) 500 MG tablet Take 1,000 mg by mouth every 6 (six) hours as needed for mild pain.  . benztropine (COGENTIN) 1 MG tablet Take 1 mg by mouth 2 (two) times daily.  . blood glucose meter kit and supplies KIT Dispense based on patient and insurance preference. Use up to four times  daily as directed. (FOR ICD-9 250.00, 250.01).  . Blood Glucose Monitoring Suppl (ACCU-CHEK AVIVA PLUS) w/Device KIT USE AS DIRECTED*NOT COVERED*  . FLUoxetine (PROZAC) 40 MG capsule Take 40 mg by mouth daily.  Marland Kitchen glucose blood (ACCU-CHEK AVIVA PLUS) test strip Check blood sugar once daily.  Marland Kitchen glucose blood (ACCU-CHEK GUIDE) test strip Use as instructed  . haloperidol (HALDOL) 2 MG tablet Take 2 mg by mouth daily.  . hydrochlorothiazide (HYDRODIURIL) 25 MG tablet TAKE 1 TABLET BY MOUTH EVERY DAY  . hydrOXYzine (ATARAX/VISTARIL) 25 MG tablet Take 25 mg by mouth in the morning and at bedtime.   Elmore Guise Devices (ACCU-CHEK SOFTCLIX) lancets Use as instructed  . Lancets (ACCU-CHEK MULTICLIX) lancets Check sugar  daily  . Multiple Vitamin (MULTIVITAMIN WITH MINERALS) TABS tablet Take 1 tablet by mouth daily.  Marland Kitchen OLANZapine (ZYPREXA) 20 MG tablet Take 20 mg by mouth at bedtime.   Marland Kitchen POTASSIUM PO Take 2 tablets by mouth daily.  . traZODone (DESYREL) 50 MG tablet Take 50 mg by mouth at bedtime as needed for sleep.  . valsartan (DIOVAN) 40 MG tablet Take 1 tablet (40 mg total) by mouth at bedtime.     Allergies:   Sibutramine hcl monohydrate   Social History   Socioeconomic History  . Marital status: Single    Spouse name: Not on file  . Number of children: Not on file  . Years of education: Not on file  . Highest education level: Not on file  Occupational History  . Not on file  Tobacco Use  . Smoking status: Former Smoker    Packs/day: 0.25    Years: 4.00    Pack years: 1.00    Types: Cigarettes    Quit date: 06/12/2004    Years since quitting: 15.3  . Smokeless tobacco: Never Used  Substance and Sexual Activity  . Alcohol use: No  . Drug use: No  . Sexual activity: Not on file    Comment: Novasure  Other Topics Concern  . Not on file  Social History Narrative  . Not on file   Social Determinants of Health   Financial Resource Strain:   . Difficulty of Paying Living Expenses:   Food Insecurity:   . Worried About Charity fundraiser in the Last Year:   . Arboriculturist in the Last Year:   Transportation Needs:   . Film/video editor (Medical):   Marland Kitchen Lack of Transportation (Non-Medical):   Physical Activity:   . Days of Exercise per Week:   . Minutes of Exercise per Session:   Stress:   . Feeling of Stress :   Social Connections:   . Frequency of Communication with Friends and Family:   . Frequency of Social Gatherings with Friends and Family:   . Attends Religious Services:   . Active Member of Clubs or Organizations:   . Attends Archivist Meetings:   Marland Kitchen Marital Status:      Family History: Mother had MI in 69s  ROS:   Please see the history of present  illness.     All other systems reviewed and are negative.  EKGs/Labs/Other Studies Reviewed:    The following studies were reviewed today:   EKG:  EKG is not ordered today.  The ekg ordered most recently demonstrates sinus tachycardia, rate 183, nonspecific T wave flattening  TTE 07/25/19: 1. Left ventricular ejection fraction, by estimation, is 60 to 65%. The  left ventricle has normal function. The  left ventricle has no regional  wall motion abnormalities. Left ventricular diastolic parameters are  indeterminate.  2. Right ventricular systolic function is normal. The right ventricular  size is normal. Tricuspid regurgitation signal is inadequate for assessing  PA pressure.  3. The mitral valve is normal in structure and function. Trivial mitral  valve regurgitation. No evidence of mitral stenosis.  4. The aortic valve was not well visualized for morphology but is grossly  normal in function. Aortic valve regurgitation is not visualized. No  aortic stenosis is present.   Cardiac monitor 08/21/19:  No significant arrhythmias detected  Occasional PVCs (1.7% of beats). Longest bigeminy episode lasted 12 seconds, and longest trigeminy episode lasted 5 seconds.  Patient triggered events corresponded to sinus rhythm +/- PVCs   14 days of data recorded on Zio monitor. Patient had a min HR of 60 bpm, max HR of 171 bpm, and avg HR of 85 bpm. Predominant underlying rhythm was Sinus Rhythm. No VT, SVT, atrial fibrillation, high degree block, or pauses noted. Isolated atrial ectopy was rare (<1%).  Isolated ventricular ectopy was occasional (1.7%).   Longest bigeminy episode lasted 12 seconds, and longest trigeminy episode lasted 5 seconds.  There were 11 triggered events, corresponding to sinus rhythm +/- PVCs. No significant arrhythmias detected.  Recent Labs: 07/09/2019: ALT 16 08/01/2019: BUN 7; Creatinine, Ser 0.84; Hemoglobin 11.4; Platelets 462; Potassium 3.8; Sodium 138  Recent  Lipid Panel    Component Value Date/Time   CHOL 152 08/31/2016 1212   TRIG 97 08/31/2016 1212   HDL 44 08/31/2016 1212   CHOLHDL 3.5 08/31/2016 1212   CHOLHDL 3.8 12/09/2014 1102   VLDL 25 12/09/2014 1102   LDLCALC 89 08/31/2016 1212    Physical Exam:    VS:  BP 122/88   Pulse 86   Temp 97.9 F (36.6 C)   Ht 6' 2"  (1.88 m)   Wt (!) 452 lb (205 kg)   SpO2 99%   BMI 58.03 kg/m     Wt Readings from Last 3 Encounters:  10/31/19 (!) 452 lb (205 kg)  07/14/19 (!) 443 lb 3.2 oz (201 kg)  07/09/19 (!) 445 lb 12.8 oz (202.2 kg)     GEN:   in no acute distress HEENT: Normal NECK: No JVD CARDIAC:tachycardic, regular, no murmurs, rubs, gallops RESPIRATORY:  Clear to auscultation without rales, wheezing or rhonchi  ABDOMEN: Soft, non-tender, non-distended MUSCULOSKELETAL:  No edema SKIN: Warm and dry NEUROLOGIC:  Alert and oriented x 3 PSYCHIATRIC:  Normal affect   ASSESSMENT:    1. PVC's (premature ventricular contractions)   2. Palpitations   3. Daytime somnolence   4. Lipid screening   5. Essential hypertension   6. Chest pain of uncertain etiology    PLAN:    Palpitations: Describes episodes where it feels like heart is racing at rest, concerning for arrhythmia.  Zio patch x14 days showed no significant arrhythmias.  Did have occasional PVCs (1.7% of beats).  Will check BMP, magnesium to ensure no electrolyte abnormality causing PVCs  Daytime somnolence: Check sleep study  Dyspnea on exertion: Suspect deconditioning is contributing.  No structural heart disease on TTE  Chest pain: Description suggests noncardiac chest pain, as describes dull aching pain not related to exertion.  No recent episodes  Hypertension: Continue valsartan 40 mg daily and hydrochlorothiazide 25 mg daily.  Appears controlled  Hyperlipidemia: On atorvastatin 20 mg daily.  Last LDL 89 on 08/31/2016.  Will check lipid panel  Prediabetes: On Metformin.  A1c 6.2 on 02/16/2017  RTC in 1  year  Medication Adjustments/Labs and Tests Ordered: Current medicines are reviewed at length with the patient today.  Concerns regarding medicines are outlined above.  Orders Placed This Encounter  Procedures  . Basic metabolic panel  . Magnesium  . Lipid panel  . Split night study   No orders of the defined types were placed in this encounter.   Patient Instructions  Medication Instructions:  Your physician recommends that you continue on your current medications as directed. Please refer to the Current Medication list given to you today.  *If you need a refill on your cardiac medications before your next appointment, please call your pharmacy*   Lab Work: BMET, Mag, Lipid  If you have labs (blood work) drawn today and your tests are completely normal, you will receive your results only by: Marland Kitchen MyChart Message (if you have MyChart) OR . A paper copy in the mail If you have any lab test that is abnormal or we need to change your treatment, we will call you to review the results.   Testing/Procedures: Your physician has recommended that you have a sleep study. This test records several body functions during sleep, including: brain activity, eye movement, oxygen and carbon dioxide blood levels, heart rate and rhythm, breathing rate and rhythm, the flow of air through your mouth and nose, snoring, body muscle movements, and chest and belly movement.  Follow-Up: At [facility], you and your health needs are our priority.  As part of our continuing mission to provide you with exceptional heart care, we have created designated Provider Care Teams.  These Care Teams include your primary Cardiologist (physician) and Advanced Practice Providers (APPs -  Physician Assistants and Nurse Practitioners) who all work together to provide you with the care you need, when you need it.  We recommend signing up for the patient portal called "MyChart".  Sign up information is provided on this After  Visit Summary.  MyChart is used to connect with patients for Virtual Visits (Telemedicine).  Patients are able to view lab/test results, encounter notes, upcoming appointments, etc.  Non-urgent messages can be sent to your provider as well.   To learn more about what you can do with MyChart, go to [email or web address].    Your next appointment:   12 month(s)  The format for your next appointment:   In Person  Provider:   Oswaldo Milian, MD        Signed, Donato Heinz, MD  10/31/2019 1:40 PM    Karen Brennan

## 2019-10-31 NOTE — Patient Instructions (Addendum)
Medication Instructions:  Your physician recommends that you continue on your current medications as directed. Please refer to the Current Medication list given to you today.  *If you need a refill on your cardiac medications before your next appointment, please call your pharmacy*   Lab Work: BMET, Mag, Lipid  If you have labs (blood work) drawn today and your tests are completely normal, you will receive your results only by: Marland Kitchen MyChart Message (if you have MyChart) OR . A paper copy in the mail If you have any lab test that is abnormal or we need to change your treatment, we will call you to review the results.   Testing/Procedures: Your physician has recommended that you have a sleep study. This test records several body functions during sleep, including: brain activity, eye movement, oxygen and carbon dioxide blood levels, heart rate and rhythm, breathing rate and rhythm, the flow of air through your mouth and nose, snoring, body muscle movements, and chest and belly movement.  Follow-Up: At [facility], you and your health needs are our priority.  As part of our continuing mission to provide you with exceptional heart care, we have created designated Provider Care Teams.  These Care Teams include your primary Cardiologist (physician) and Advanced Practice Providers (APPs -  Physician Assistants and Nurse Practitioners) who all work together to provide you with the care you need, when you need it.  We recommend signing up for the patient portal called "MyChart".  Sign up information is provided on this After Visit Summary.  MyChart is used to connect with patients for Virtual Visits (Telemedicine).  Patients are able to view lab/test results, encounter notes, upcoming appointments, etc.  Non-urgent messages can be sent to your provider as well.   To learn more about what you can do with MyChart, go to [email or web address].    Your next appointment:   12 month(s)  The format for  your next appointment:   In Person  Provider:   Epifanio Lesches, MD

## 2019-11-13 LAB — BASIC METABOLIC PANEL
BUN/Creatinine Ratio: 16 (ref 9–23)
BUN: 13 mg/dL (ref 6–24)
CO2: 25 mmol/L (ref 20–29)
Calcium: 8.9 mg/dL (ref 8.7–10.2)
Chloride: 99 mmol/L (ref 96–106)
Creatinine, Ser: 0.8 mg/dL (ref 0.57–1.00)
GFR calc Af Amer: 105 mL/min/{1.73_m2} (ref >59)
GFR calc non Af Amer: 91 mL/min/{1.73_m2} (ref >59)
Glucose: 117 mg/dL — ABNORMAL HIGH (ref 65–99)
Potassium: 4.1 mmol/L (ref 3.5–5.2)
Sodium: 138 mmol/L (ref 134–144)

## 2019-11-13 LAB — MAGNESIUM: Magnesium: 1.8 mg/dL (ref 1.6–2.3)

## 2019-11-13 LAB — LIPID PANEL
Chol/HDL Ratio: 2.9 ratio (ref 0.0–4.4)
Cholesterol, Total: 160 mg/dL (ref 100–199)
HDL: 55 mg/dL (ref >39)
LDL Chol Calc (NIH): 92 mg/dL (ref 0–99)
Triglycerides: 67 mg/dL (ref 0–149)
VLDL Cholesterol Cal: 13 mg/dL (ref 5–40)

## 2019-11-23 ENCOUNTER — Other Ambulatory Visit: Payer: Self-pay | Admitting: Family Medicine

## 2019-11-23 DIAGNOSIS — G5793 Unspecified mononeuropathy of bilateral lower limbs: Secondary | ICD-10-CM

## 2019-11-23 DIAGNOSIS — R7303 Prediabetes: Secondary | ICD-10-CM

## 2019-12-18 ENCOUNTER — Ambulatory Visit
Admission: EM | Admit: 2019-12-18 | Discharge: 2019-12-18 | Disposition: A | Discharge status: Home / Self Care | Payer: Medicaid Other | Attending: Emergency Medicine | Admitting: Emergency Medicine

## 2019-12-18 DIAGNOSIS — J069 Acute upper respiratory infection, unspecified: Secondary | ICD-10-CM | POA: Diagnosis not present

## 2019-12-18 MED ORDER — AEROCHAMBER PLUS FLO-VU MEDIUM MISC: 1.0000 | Freq: Once | 0 refills | Status: AC

## 2019-12-18 MED ORDER — BENZONATATE 100 MG PO CAPS: 100.0000 mg | ORAL_CAPSULE | Freq: Three times a day (TID) | ORAL | 0 refills | Status: DC

## 2019-12-18 MED ORDER — CETIRIZINE HCL 10 MG PO TABS: 10.0000 mg | ORAL_TABLET | Freq: Every day | ORAL | 0 refills | Status: DC

## 2019-12-18 MED ORDER — FLUTICASONE PROPIONATE 50 MCG/ACT NA SUSP: 1.0000 | Freq: Every day | NASAL | 0 refills | Status: DC

## 2019-12-18 MED ORDER — ALBUTEROL SULFATE HFA 108 (90 BASE) MCG/ACT IN AERS: 2.0000 | INHALATION_SPRAY | RESPIRATORY_TRACT | 0 refills | Status: DC | PRN

## 2019-12-18 NOTE — ED Triage Notes (Addendum)
Pt c/o productive cough, sinus pressure, headaches, and clogged ears x1 wk. States taking tylenol and mucus relief with some relief. States is fully covid vaccinated.

## 2019-12-18 NOTE — ED Provider Notes (Signed)
[facility]    CSN: [id number] Arrival date & time: 12/18/19  0944      History   Chief Complaint Chief Complaint  Patient presents with  . Cough    HPI Karen Brennan is a 44 y.o. female with history of obesity, GERD, bipolar 1 disorder, migraines, asthma in childhood presenting for URI symptoms x1 week.  Patient endorsing ear popping, frontal headaches, sinus pressure, mildly productive cough without hemoptysis.  Patient denying palpitations, chest pain, shortness of breath, lower leg swelling, nausea, vomiting, abdominal pain, fever.  Patient has been fully Covid vaccinated since March 2021: No known sick contacts.  Has tried Tylenol, Mucinex without relief.   Past Medical History:  Diagnosis Date  . ANEMIA 08/14/2008   Qualifier: Diagnosis of  By: Genene Churn MD, Janett Billow    . Anxiety   . Bipolar 1 disorder ([place])   . Blood transfusion   . Depression   . GERD (gastroesophageal reflux disease)   . History of bronchitis   . Hypertension   . Kidney stones 10/09/11  . Migraines 10/09/11   "often"    Patient Active Problem List   Diagnosis Date Noted  . Tachycardia 07/10/2019  . Systolic murmur 93/81/8299  . H/O gastric bypass 07/10/2019  . Rash and nonspecific skin eruption 11/13/2016  . Prediabetes 06/26/2016  . Neuropathic pain of both feet 05/09/2016  . Headache 05/09/2016  . 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 Healthcare maintenance 12/09/2014  . Seborrheic dermatitis 12/05/2013  . Fatigue 01/06/2013  . Anxiety 10/25/2011  . Bipolar 1 disorder, mixed ([place]) 09/15/2011  . Insomnia 10/21/2010  . Allergic rhinitis 08/31/2008  . Anemia 08/14/2008  . Esophageal reflux 01/24/2008  . HYPERTENSION, BENIGN ESSENTIAL 08/17/2006  . Sinusitis, chronic 08/17/2006  . Morbid obesity with BMI of 50.0-59.9, adult ([place]) 08/09/2006  . DEPRESSION, MAJOR, RECURRENT 08/09/2006    Past Surgical History:  Procedure Laterality Date  . ANKLE  FRACTURE SURGERY   1990's   "left; put screws in"  . ENDOMETRIAL ABLATION  ~ 2011  . FRACTURE SURGERY    . GASTRIC BY PASS    . TUBAL LIGATION  2006    OB History   No obstetric history on file.      Home Medications    Prior to Admission medications   Medication Sig Start Date End Date Taking? Authorizing Provider  ACCU-CHEK FASTCLIX LANCETS MISC Check blood sugar once daily, alternating before breakfast and an hour after largest meal of day. 11/30/16   Rumley, Burna Cash, DO  acetaminophen (TYLENOL) 500 MG tablet Take 1,000 mg by mouth every 6 (six) hours as needed for mild pain.    [provider]  albuterol (VENTOLIN HFA) 108 (90 Base) MCG/ACT inhaler Inhale 2 puffs into the lungs every 4 (four) hours as needed for wheezing or shortness of breath. 12/18/19   Hall-Potvin, [place], PA-C  benzonatate (TESSALON) 100 MG capsule Take 1 capsule (100 mg total) by mouth every 8 (eight) hours. 12/18/19   Hall-Potvin, [place], PA-C  benztropine (COGENTIN) 1 MG tablet Take 1 mg by mouth 2 (two) times daily. 06/18/19   [provider]  blood glucose meter kit and supplies KIT Dispense based on patient and insurance preference. Use up to four times daily as directed. (FOR ICD-9 250.00, 250.01). 03/11/18   Sherene Sires, DO  Blood Glucose Monitoring Suppl (ACCU-CHEK AVIVA PLUS) w/Device KIT USE AS DIRECTED*NOT COVERED* 03/23/17   Nicolette Bang, DO  cetirizine (ZYRTEC ALLERGY) 10  MG tablet Take 1 tablet (10 mg total) by mouth daily. 12/18/19   Hall-Potvin, [place], PA-C  FLUoxetine (PROZAC) 40 MG capsule Take 40 mg by mouth daily.    [provider]  fluticasone (FLONASE) 50 MCG/ACT nasal spray Place 1 spray into both nostrils daily. 12/18/19   Hall-Potvin, [place], PA-C  gabapentin (NEURONTIN) 300 MG capsule TAKE 1 CAPSULE (300 MG TOTAL) BY MOUTH 3 (THREE) TIMES DAILY FOR 30 DAYS. 11/24/19 12/24/19  Sherene Sires, DO  glucose blood (ACCU-CHEK AVIVA PLUS) test strip Check  blood sugar once daily. 05/07/17   Mayo, Pete Pelt, MD  glucose blood (ACCU-CHEK GUIDE) test strip Use as instructed 05/07/17   Mayo, Pete Pelt, MD  haloperidol (HALDOL) 2 MG tablet Take 2 mg by mouth daily.    [provider]  hydrochlorothiazide (HYDRODIURIL) 25 MG tablet TAKE 1 TABLET BY MOUTH EVERY DAY 10/27/19   Sherene Sires, DO  hydrOXYzine (ATARAX/VISTARIL) 25 MG tablet Take 25 mg by mouth in the morning and at bedtime.     [provider]  Lancet Devices [facility]) lancets Use as instructed 02/16/17   Nicolette Bang, DO  Lancets (ACCU-CHEK MULTICLIX) lancets Check sugar daily 02/16/17   Nicolette Bang, DO  metFORMIN (GLUCOPHAGE-XR) 500 MG 24 hr tablet TAKE 1 TABLET BY MOUTH EVERY DAY WITH BREAKFAST 11/24/19   Sherene Sires, DO  Multiple Vitamin (MULTIVITAMIN WITH MINERALS) TABS tablet Take 1 tablet by mouth daily.    [provider]  OLANZapine (ZYPREXA) 20 MG tablet Take 20 mg by mouth at bedtime.     [provider]  POTASSIUM PO Take 2 tablets by mouth daily.    [provider]  Spacer/Aero-Holding Chambers (AEROCHAMBER PLUS FLO-VU MEDIUM) MISC 1 each by Other route once for 1 dose. 12/18/19 12/18/19  Hall-Potvin, [place], PA-C  traZODone (DESYREL) 50 MG tablet Take 50 mg by mouth at bedtime as needed for sleep. 04/07/19   [provider]  valsartan (DIOVAN) 40 MG tablet Take 1 tablet (40 mg total) by mouth at bedtime. 11/15/18   Kathrene Alu, MD    Family History History reviewed. No pertinent family history.  Social History Social History   Tobacco Use  . Smoking status: Former Smoker    Packs/day: 0.25    Years: 4.00    Pack years: 1.00    Types: Cigarettes    Quit date: 06/12/2004    Years since quitting: 15.5  . Smokeless tobacco: Never Used  Substance Use Topics  . Alcohol use: No  . Drug use: No     Allergies   Sibutramine hcl monohydrate   Review of Systems As per  HPI   Physical Exam Triage Vital Signs ED Triage Vitals  Enc Vitals Group     BP      Pulse      Resp      Temp      Temp src      SpO2      Weight      Height      Head Circumference      Peak Flow      Pain Score      Pain Loc      Pain Edu?      Excl. in [place]?    No data found.  Updated Vital Signs BP 115/79 (BP Location: Left Arm)   Pulse 94   Temp 98.3 F (36.8 C) (Oral)   Resp 20   SpO2 96%  Visual Acuity Right Eye Distance:   Left Eye Distance:   Bilateral Distance:    Right Eye Near:   Left Eye Near:    Bilateral Near:     Physical Exam Constitutional:      General: She is not in acute distress.    Appearance: She is obese. She is not ill-appearing or diaphoretic.  HENT:     Head: Normocephalic and atraumatic.     Right Ear: Tympanic membrane, ear canal and external ear normal.     Left Ear: Tympanic membrane, ear canal and external ear normal.     Nose: Nose normal.     Mouth/Throat:     Mouth: Mucous membranes are moist.     Pharynx: Oropharynx is clear. No oropharyngeal exudate or posterior oropharyngeal erythema.  Eyes:     General: No scleral icterus.    Conjunctiva/sclera: Conjunctivae normal.     Pupils: Pupils are equal, round, and reactive to light.  Neck:     Comments: Trachea midline, negative JVD Cardiovascular:     Rate and Rhythm: Normal rate and regular rhythm.     Heart sounds: No murmur heard.  No gallop.   Pulmonary:     Effort: Pulmonary effort is normal. No respiratory distress.     Breath sounds: No wheezing or rales.  Musculoskeletal:     Cervical back: Neck supple. No tenderness.  Lymphadenopathy:     Cervical: No cervical adenopathy.  Skin:    Capillary Refill: Capillary refill takes less than 2 seconds.     Coloration: Skin is not jaundiced or pale.     Findings: No rash.  Neurological:     General: No focal deficit present.     Mental Status: She is alert and oriented to person, place, and time.      UC  Treatments / Results  Labs (all labs ordered are listed, but only abnormal results are displayed) Labs Reviewed  NOVEL CORONAVIRUS, NAA    EKG   Radiology No results found.  Procedures Procedures (including critical care time)  Medications Ordered in UC Medications - No data to display  Initial Impression / Assessment and Plan / UC Course  I have reviewed the triage vital signs and the nursing notes.  Pertinent labs & imaging results that were available during my care of the patient were reviewed by me and considered in my medical decision making (see chart for details).     Patient afebrile, nontoxic, with SpO2 96%.  Covid PCR pending.  Patient to quarantine until results are back.  We will treat supportively as outlined below.  Return precautions discussed, patient verbalized understanding and is agreeable to plan. Final Clinical Impressions(s) / UC Diagnoses   Final diagnoses:  URI with cough and congestion     Discharge Instructions     Tessalon for cough. Start flonase, atrovent nasal spray for nasal congestion/drainage. You can use over the counter nasal saline rinse such as neti pot for nasal congestion. Keep hydrated, your urine should be clear to pale yellow in color. Tylenol/motrin for fever and pain. Monitor for any worsening of symptoms, chest pain, shortness of breath, wheezing, swelling of the throat, go to the emergency department for further evaluation needed.     ED Prescriptions    Medication Sig Dispense Auth. Provider   albuterol (VENTOLIN HFA) 108 (90 Base) MCG/ACT inhaler Inhale 2 puffs into the lungs every 4 (four) hours as needed for wheezing or shortness of breath. 18 g Hall-Potvin, [place], PA-C  Spacer/Aero-Holding Chambers (AEROCHAMBER PLUS FLO-VU MEDIUM) MISC 1 each by Other route once for 1 dose. 1 each Hall-Potvin, [place], PA-C   benzonatate (TESSALON) 100 MG capsule Take 1 capsule (100 mg total) by mouth every 8 (eight) hours. 21 capsule  Hall-Potvin, [place], PA-C   cetirizine (ZYRTEC ALLERGY) 10 MG tablet Take 1 tablet (10 mg total) by mouth daily. 30 tablet Hall-Potvin, [place], PA-C   fluticasone (FLONASE) 50 MCG/ACT nasal spray Place 1 spray into both nostrils daily. 16 g Hall-Potvin, [place], PA-C     PDMP not reviewed this encounter.   Hall-Potvin, [place], [place] 12/18/19 1011

## 2019-12-18 NOTE — Discharge Instructions (Signed)

## 2019-12-19 LAB — NOVEL CORONAVIRUS, NAA: SARS-CoV-2, NAA: NOT DETECTED

## 2019-12-19 LAB — SARS-COV-2, NAA 2 DAY TAT

## 2020-01-05 ENCOUNTER — Other Ambulatory Visit: Payer: Self-pay | Admitting: Family Medicine

## 2020-01-05 DIAGNOSIS — G5793 Unspecified mononeuropathy of bilateral lower limbs: Secondary | ICD-10-CM

## 2020-01-08 ENCOUNTER — Other Ambulatory Visit: Payer: Self-pay | Admitting: *Deleted

## 2020-01-08 DIAGNOSIS — G5793 Unspecified mononeuropathy of bilateral lower limbs: Secondary | ICD-10-CM

## 2020-01-08 MED ORDER — GABAPENTIN 300 MG PO CAPS: 300.0000 mg | ORAL_CAPSULE | Freq: Three times a day (TID) | ORAL | 3 refills | Status: DC

## 2020-01-08 NOTE — Telephone Encounter (Signed)
Routing to Dr. Manson Passey due to Dr. Constance Goltz not being in Medicaid at this point.Karen Brennan, CMA

## 2020-02-12 ENCOUNTER — Ambulatory Visit (INDEPENDENT_AMBULATORY_CARE_PROVIDER_SITE_OTHER): Payer: Medicaid Other | Admitting: Family Medicine

## 2020-02-12 ENCOUNTER — Encounter: Payer: Self-pay | Admitting: Family Medicine

## 2020-02-12 ENCOUNTER — Other Ambulatory Visit (HOSPITAL_COMMUNITY)
Admission: RE | Admit: 2020-02-12 | Discharge: 2020-02-12 | Disposition: A | Discharge status: Home / Self Care | Payer: Medicaid Other | Source: Ambulatory Visit | Attending: Family Medicine | Admitting: Family Medicine

## 2020-02-12 ENCOUNTER — Other Ambulatory Visit: Payer: Self-pay

## 2020-02-12 VITALS — BP 110/86 | HR 115 | Ht 74.0 in | Wt >= 6400 oz

## 2020-02-12 DIAGNOSIS — Z124 Encounter for screening for malignant neoplasm of cervix: Secondary | ICD-10-CM | POA: Diagnosis present

## 2020-02-12 DIAGNOSIS — G609 Hereditary and idiopathic neuropathy, unspecified: Secondary | ICD-10-CM

## 2020-02-12 DIAGNOSIS — L304 Erythema intertrigo: Secondary | ICD-10-CM | POA: Diagnosis not present

## 2020-02-12 DIAGNOSIS — I10 Essential (primary) hypertension: Secondary | ICD-10-CM

## 2020-02-12 DIAGNOSIS — F3160 Bipolar disorder, current episode mixed, unspecified: Secondary | ICD-10-CM

## 2020-02-12 DIAGNOSIS — Z0000 Encounter for general adult medical examination without abnormal findings: Secondary | ICD-10-CM

## 2020-02-12 DIAGNOSIS — R000 Tachycardia, unspecified: Secondary | ICD-10-CM | POA: Diagnosis not present

## 2020-02-12 DIAGNOSIS — Z23 Encounter for immunization: Secondary | ICD-10-CM | POA: Diagnosis not present

## 2020-02-12 DIAGNOSIS — R7303 Prediabetes: Secondary | ICD-10-CM

## 2020-02-12 DIAGNOSIS — G5793 Unspecified mononeuropathy of bilateral lower limbs: Secondary | ICD-10-CM

## 2020-02-12 LAB — POCT GLYCOSYLATED HEMOGLOBIN (HGB A1C): HbA1c, POC (controlled diabetic range): 6.2 % (ref 0.0–7.0)

## 2020-02-12 MED ORDER — ACCU-CHEK GUIDE W/DEVICE KIT: PACK | 0 refills | Status: DC

## 2020-02-12 MED ORDER — FLUTICASONE PROPIONATE 50 MCG/ACT NA SUSP: 1.0000 | Freq: Every day | NASAL | 0 refills | Status: DC

## 2020-02-12 MED ORDER — CETIRIZINE HCL 10 MG PO TABS: 10.0000 mg | ORAL_TABLET | Freq: Every day | ORAL | 0 refills | Status: DC

## 2020-02-12 MED ORDER — ACCU-CHEK GUIDE VI STRP: ORAL_STRIP | 12 refills | Status: DC

## 2020-02-12 MED ORDER — CLOTRIMAZOLE 1 % EX CREA: 1.0000 "application " | TOPICAL_CREAM | Freq: Two times a day (BID) | CUTANEOUS | 0 refills | Status: DC

## 2020-02-12 MED ORDER — VALSARTAN 40 MG PO TABS: 40.0000 mg | ORAL_TABLET | Freq: Every day | ORAL | 3 refills | Status: DC

## 2020-02-12 NOTE — Progress Notes (Signed)
    SUBJECTIVE:   CHIEF COMPLAINT / HPI:   HTN: Patient taking valsartan 40mg , HCTZ 25.  She is not experience any side effects.  Is compliant with her medication daily.  Prediabetes:  Patient does not have a glucometer.  The one we sent in for her last time was the incorrect 1 for her insurance.  Currently taking Metformin 500 once a day.  She has been drinking protein shakes and meal replacements in an effort to lose weight.  The patient is taking gabapentin 300/300/600 for her neuropathy.  Rash on breast: Patient has been experiencing a rash underneath her breasts bilaterally for a couple of days now.  She has been putting an antifungal over-the-counter cream on it once a day.  Bipolar disorder: Currently taking prozac 40, haldol 2mg  , zyprexa 20mg , and benztropine 1mg .  She sees health services for her prescriptions as well as her therapy.  She has an appointment next week with them.  Patient does say that she hears voices sometimes.  No suicidal or homicidal ideation.  PERTINENT  PMH / PSH: Bipolar disorder, prediabetes, neuropathy  OBJECTIVE:   BP 110/86   Pulse (!) 115   Ht 6\' 2"  (1.88 m)   Wt (!) 449 lb 3.2 oz (203.8 kg)   SpO2 97%   BMI 57.67 kg/m   General: Alert and oriented.  No acute distress. CV: Regular rate and rhythm, no murmurs Pulmonary: Lungs clear to auscultation bilaterally GI: Soft, nontender to palpation. GU: Normal appearing vaginal vault, normal-appearing vulvovaginal region.  Nabothian cyst seen on cervix at approximately 12 o'clock position. Psych: Pleasant affect.  ASSESSMENT/PLAN:   HYPERTENSION, BENIGN ESSENTIAL Well-controlled on current medication.  Continue HCTZ and losartan.  Will get BMP today.  Prediabetes Prescribed Metformin previously.  Patient remains in the prediabetic range with A1c at 6.2 today.  We will discuss at our next visit continuing Metformin versus stopping for prediabetes and managing with diet and  exercise. -Send in prescription for new glucometer   Neuropathic pain of both feet (H[facility]) Patient continues to take gabapentin for neuropathy the bilateral feet.  This appears to have started in 2017.  Metabolic work-up at that time was unrevealing.  We will get TSH and B12 today.  Likely represents idiopathic peripheral neuropathy.  Currently taking gabapentin 300/300/600.  Intertrigo Linear hyperpigmented rash present in the skin fold underneath both breasts.  Likely represents intertrigo.  Prescribed Lotrimin cream advised patient to use twice a day until rash is resolved.  Tachycardia Heart rate 115 today.  Has had multiple office visits with heart rate above 100 in the past.  In March the patient was sent to the cardiologist and echo was normal and so was long-term cardiac monitor.  We will continue to monitor.  Bipolar 1 disorder, mixed Currently managed by [facility].  Taking Prozac 40, Haldol 2 mg, Zyprexa 20 mg, benztropine 1 mg.  Symptoms appear under control today.  Patient does endorse hearing voices.  Healthcare maintenance Perform Pap smear today.  We will follow up with results for the patient when I get them.     [employer], MD [facility]

## 2020-02-12 NOTE — Patient Instructions (Addendum)
It was nice to meet you today,  I will let you know the results of your Pap smear when I get it. I have refilled the medications you asked to be refilled. For your rash on your breast you should put on the antifungal cream twice a day for least 3 days after it resolves. I will resend your meter in so that is the correct one for your insurance.  Please follow-up with me in 6 months or sooner if needed.  Have a great day,  Frederic Jericho, MD

## 2020-02-13 LAB — BASIC METABOLIC PANEL
BUN/Creatinine Ratio: 12 (ref 9–23)
BUN: 10 mg/dL (ref 6–24)
CO2: 25 mmol/L (ref 20–29)
Calcium: 9.6 mg/dL (ref 8.7–10.2)
Chloride: 99 mmol/L (ref 96–106)
Creatinine, Ser: 0.84 mg/dL (ref 0.57–1.00)
GFR calc Af Amer: 99 mL/min/{1.73_m2} (ref >59)
GFR calc non Af Amer: 86 mL/min/{1.73_m2} (ref >59)
Glucose: 108 mg/dL — ABNORMAL HIGH (ref 65–99)
Potassium: 4.4 mmol/L (ref 3.5–5.2)
Sodium: 138 mmol/L (ref 134–144)

## 2020-02-13 LAB — TSH: TSH: 1.35 u[IU]/mL (ref 0.450–4.500)

## 2020-02-13 LAB — VITAMIN B12: Vitamin B-12: 203 pg/mL — ABNORMAL LOW (ref 232–1245)

## 2020-02-15 ENCOUNTER — Encounter: Payer: Self-pay | Admitting: Family Medicine

## 2020-02-16 DIAGNOSIS — L304 Erythema intertrigo: Secondary | ICD-10-CM | POA: Insufficient documentation

## 2020-02-16 NOTE — Assessment & Plan Note (Signed)
Heart rate 115 today.  Has had multiple office visits with heart rate above 100 in the past.  In March the patient was sent to the cardiologist and echo was normal and so was long-term cardiac monitor.  We will continue to monitor.

## 2020-02-16 NOTE — Assessment & Plan Note (Addendum)
Well-controlled on current medication.  Continue HCTZ and losartan.  Will get BMP today.

## 2020-02-16 NOTE — Assessment & Plan Note (Signed)
Prescribed Metformin previously.  Patient remains in the prediabetic range with A1c at 6.2 today.  We will discuss at our next visit continuing Metformin versus stopping for prediabetes and managing with diet and exercise. -Send in prescription for new glucometer

## 2020-02-16 NOTE — Assessment & Plan Note (Signed)
Currently managed by [employer]  Taking Prozac 40, Haldol 2 mg, Zyprexa 20 mg, benztropine 1 mg.  Symptoms appear under control today.  Patient does endorse hearing voices.

## 2020-02-16 NOTE — Assessment & Plan Note (Signed)
Linear hyperpigmented rash present in the skin fold underneath both breasts.  Likely represents intertrigo.  Prescribed Lotrimin cream advised patient to use twice a day until rash is resolved.

## 2020-02-16 NOTE — Assessment & Plan Note (Signed)
Patient continues to take gabapentin for neuropathy the bilateral feet.  This appears to have started in 2017.  Metabolic work-up at that time was unrevealing.  We will get TSH and B12 today.  Likely represents idiopathic peripheral neuropathy.  Currently taking gabapentin 300/300/600.

## 2020-02-16 NOTE — Assessment & Plan Note (Signed)
Perform Pap smear today.  We will follow up with results for the patient when I get them.

## 2020-02-17 LAB — CYTOLOGY - PAP
Comment: NEGATIVE
Diagnosis: NEGATIVE
Diagnosis: REACTIVE
High risk HPV: POSITIVE — AB

## 2020-02-18 ENCOUNTER — Other Ambulatory Visit: Payer: Self-pay | Admitting: Family Medicine

## 2020-02-18 MED ORDER — ACCU-CHEK SOFTCLIX LANCETS MISC: 12 refills | Status: DC

## 2020-03-02 ENCOUNTER — Other Ambulatory Visit: Payer: Self-pay | Admitting: *Deleted

## 2020-03-02 DIAGNOSIS — R7303 Prediabetes: Secondary | ICD-10-CM

## 2020-03-03 MED ORDER — METFORMIN HCL ER 500 MG PO TB24: ORAL_TABLET | ORAL | 0 refills | Status: DC

## 2020-04-02 ENCOUNTER — Emergency Department (HOSPITAL_COMMUNITY)
Admission: EM | Admit: 2020-04-02 | Discharge: 2020-04-02 | Disposition: A | Discharge status: Home / Self Care | Payer: Medicaid Other | Attending: Emergency Medicine | Admitting: Emergency Medicine

## 2020-04-02 ENCOUNTER — Emergency Department (HOSPITAL_COMMUNITY): Payer: Medicaid Other

## 2020-04-02 ENCOUNTER — Encounter (HOSPITAL_COMMUNITY): Payer: Self-pay | Admitting: Pharmacy Technician

## 2020-04-02 ENCOUNTER — Other Ambulatory Visit: Payer: Self-pay

## 2020-04-02 DIAGNOSIS — R079 Chest pain, unspecified: Secondary | ICD-10-CM | POA: Diagnosis present

## 2020-04-02 DIAGNOSIS — I10 Essential (primary) hypertension: Secondary | ICD-10-CM | POA: Insufficient documentation

## 2020-04-02 DIAGNOSIS — Z87891 Personal history of nicotine dependence: Secondary | ICD-10-CM | POA: Insufficient documentation

## 2020-04-02 DIAGNOSIS — Z79899 Other long term (current) drug therapy: Secondary | ICD-10-CM | POA: Insufficient documentation

## 2020-04-02 LAB — BASIC METABOLIC PANEL
Anion gap: 12 (ref 5–15)
BUN: 7 mg/dL (ref 6–20)
CO2: 24 mmol/L (ref 22–32)
Calcium: 8.7 mg/dL — ABNORMAL LOW (ref 8.9–10.3)
Chloride: 104 mmol/L (ref 98–111)
Creatinine, Ser: 0.86 mg/dL (ref 0.44–1.00)
GFR, Estimated: >60 mL/min (ref >60)
Glucose, Bld: 118 mg/dL — ABNORMAL HIGH (ref 70–99)
Potassium: 3.6 mmol/L (ref 3.5–5.1)
Sodium: 140 mmol/L (ref 135–145)

## 2020-04-02 LAB — CBC
HCT: 36.9 % (ref 36.0–46.0)
Hemoglobin: 10.5 g/dL — ABNORMAL LOW (ref 12.0–15.0)
MCH: 23.9 pg — ABNORMAL LOW (ref 26.0–34.0)
MCHC: 28.5 g/dL — ABNORMAL LOW (ref 30.0–36.0)
MCV: 84.1 fL (ref 80.0–100.0)
Platelets: 475 10*3/uL — ABNORMAL HIGH (ref 150–400)
RBC: 4.39 MIL/uL (ref 3.87–5.11)
RDW: 13.6 % (ref 11.5–15.5)
WBC: 7.9 10*3/uL (ref 4.0–10.5)
nRBC: 0 % (ref 0.0–0.2)

## 2020-04-02 LAB — I-STAT BETA HCG BLOOD, ED (MC, WL, AP ONLY): I-stat hCG, quantitative: <5 m[IU]/mL (ref <5)

## 2020-04-02 LAB — TROPONIN I (HIGH SENSITIVITY)
Troponin I (High Sensitivity): <2 ng/L (ref <18)
Troponin I (High Sensitivity): 3 ng/L (ref <18)

## 2020-04-02 NOTE — Discharge Instructions (Addendum)
Follow-up with your doctors. 

## 2020-04-02 NOTE — ED Provider Notes (Signed)
[facility] EMERGENCY DEPARTMENT Provider Note   CSN: [id number] Arrival date & time: 04/02/20  4287     History Chief Complaint  Patient presents with  . Chest Pain    Karen Brennan is a 44 y.o. female.  HPI Patient presents with chest pain.  States around 6-6 this morning developed chest pain.  Was in her upper chest and went on both sides of her neck.  It was somewhat crampy.  Last around 10 minutes and has resolved.  Has not had episodes like this before.  No fevers chills or cough.  No swelling her legs.  States she seen cardiology before and had to wear a cardiac monitor.  Never had a heart catheterization.  Does not smoke.  No swelling in her legs.  Has not been around anyone sick.  Pain-free now.  Over the last few days has had no exertional chest pain.    Past Medical History:  Diagnosis Date  . ANEMIA 08/14/2008   Qualifier: Diagnosis of  By: Genene Churn MD, Janett Billow    . Anxiety   . Bipolar 1 disorder ([place])   . Blood transfusion   . Depression   . GERD (gastroesophageal reflux disease)   . History of bronchitis   . Hypertension   . Kidney stones 10/09/11  . Migraines 10/09/11   "often"    Patient Active Problem List   Diagnosis Date Noted  . Intertrigo 02/16/2020  . Tachycardia 07/10/2019  . Systolic murmur 68/04/5725  . H/O gastric bypass 07/10/2019  . Prediabetes 06/26/2016  . Neuropathic pain of both feet 05/09/2016  . Headache 05/09/2016  . 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 Healthcare maintenance 12/09/2014  . Seborrheic dermatitis 12/05/2013  . Fatigue 01/06/2013  . Anxiety 10/25/2011  . Bipolar 1 disorder, mixed ([place]) 09/15/2011  . Insomnia 10/21/2010  . Allergic rhinitis 08/31/2008  . Anemia 08/14/2008  . Esophageal reflux 01/24/2008  . HYPERTENSION, BENIGN ESSENTIAL 08/17/2006  . Sinusitis, chronic 08/17/2006  . Morbid obesity with BMI of 50.0-59.9, adult ([place]) 08/09/2006  . DEPRESSION, MAJOR,  RECURRENT 08/09/2006    Past Surgical History:  Procedure Laterality Date  . ANKLE FRACTURE SURGERY   1990's   "left; put screws in"  . ENDOMETRIAL ABLATION  ~ 2011  . FRACTURE SURGERY    . GASTRIC BY PASS    . TUBAL LIGATION  2006     OB History   No obstetric history on file.     No family history on file.  Social History   Tobacco Use  . Smoking status: Former Smoker    Packs/day: 0.25    Years: 4.00    Pack years: 1.00    Types: Cigarettes    Quit date: 06/12/2004    Years since quitting: 15.8  . Smokeless tobacco: Never Used  Substance Use Topics  . Alcohol use: No  . Drug use: No    Home Medications Prior to Admission medications   Medication Sig Start Date End Date Taking? Authorizing Provider  acetaminophen (TYLENOL) 500 MG tablet Take 1,000 mg by mouth every 6 (six) hours as needed for mild pain.   Yes [provider]  albuterol (VENTOLIN HFA) 108 (90 Base) MCG/ACT inhaler Inhale 2 puffs into the lungs every 4 (four) hours as needed for wheezing or shortness of breath. 12/18/19  Yes Hall-Potvin, [place], PA-C  amoxicillin (AMOXIL) 500 MG tablet Take 500 mg by mouth in the morning, at noon, and at bedtime. 03/31/20 04/12/20  Yes [provider]  benztropine (COGENTIN) 1 MG tablet Take 1 mg by mouth 2 (two) times daily. 06/18/19  Yes [provider]  cetirizine (ZYRTEC ALLERGY) 10 MG tablet Take 1 tablet (10 mg total) by mouth daily. 02/12/20  Yes Benay Pike, MD  clotrimazole (LOTRIMIN) 1 % cream Apply 1 application topically 2 (two) times daily. 02/12/20  Yes Benay Pike, MD  FLUoxetine (PROZAC) 40 MG capsule Take 40 mg by mouth daily.   Yes [provider]  fluticasone (FLONASE) 50 MCG/ACT nasal spray Place 1 spray into both nostrils daily. 02/12/20  Yes Benay Pike, MD  gabapentin (NEURONTIN) 300 MG capsule Take 1 capsule (300 mg total) by mouth 3 (three) times daily. 01/08/20  Yes Martyn Malay, MD  haloperidol (HALDOL) 2  MG tablet Take 2 mg by mouth daily.   Yes [provider]  hydrochlorothiazide (HYDRODIURIL) 25 MG tablet TAKE 1 TABLET BY MOUTH EVERY DAY Patient taking differently: Take 25 mg by mouth daily.  10/27/19  Yes Sherene Sires, DO  hydrOXYzine (ATARAX/VISTARIL) 25 MG tablet Take 25 mg by mouth in the morning and at bedtime.    Yes [provider]  ibuprofen (ADVIL) 800 MG tablet Take 800 mg by mouth every 8 (eight) hours as needed for moderate pain.   Yes [provider]  metFORMIN (GLUCOPHAGE-XR) 500 MG 24 hr tablet TAKE 1 TABLET BY MOUTH EVERY DAY WITH BREAKFAST 03/03/20  Yes Benay Pike, MD  Multiple Vitamin (MULTIVITAMIN WITH MINERALS) TABS tablet Take 1 tablet by mouth daily.   Yes [provider]  OLANZapine (ZYPREXA) 20 MG tablet Take 20 mg by mouth at bedtime.    Yes [provider]  traZODone (DESYREL) 50 MG tablet Take 50 mg by mouth at bedtime as needed for sleep. 04/07/19  Yes [provider]  valsartan (DIOVAN) 40 MG tablet Take 1 tablet (40 mg total) by mouth at bedtime. 02/12/20  Yes Benay Pike, MD  Accu-Chek Softclix Lancets lancets Use as instructed 02/18/20   Benay Pike, MD  blood glucose meter kit and supplies KIT Dispense based on patient and insurance preference. Use up to four times daily as directed. (FOR ICD-9 250.00, 250.01). 03/11/18   Sherene Sires, DO  Blood Glucose Monitoring Suppl (ACCU-CHEK GUIDE) w/Device KIT Check blood glucose in AM while fasting 02/12/20   Benay Pike, MD  glucose blood (ACCU-CHEK GUIDE) test strip Use as instructed 02/12/20   Benay Pike, MD  Lancet Devices [facility]) lancets Use as instructed 02/16/17   Nicolette Bang, DO    Allergies    Sibutramine hcl monohydrate  Review of Systems   Review of Systems  Constitutional: Negative for appetite change.  HENT: Negative for congestion.   Respiratory: Negative for shortness of breath.   Cardiovascular: Positive for  chest pain.  Gastrointestinal: Negative for abdominal pain.  Genitourinary: Negative for dysuria.  Musculoskeletal: Positive for neck pain.  Skin: Negative for pallor.  Neurological: Negative for weakness.  Psychiatric/Behavioral: Negative for confusion.    Physical Exam Updated Vital Signs BP 121/63 (BP Location: Right Arm)   Pulse 84   Temp 98 F (36.7 C) (Oral)   Resp (!) 21   Ht 6' 2"  (1.88 m)   Wt (!) 199.6 kg   LMP 03/31/2020 Comment: tubal ligation  SpO2 100%   BMI 56.49 kg/m   Physical Exam Vitals and nursing note reviewed.  Constitutional:      Appearance: She  is obese.  HENT:     Head: Normocephalic.  Cardiovascular:     Rate and Rhythm: Normal rate and regular rhythm.  Pulmonary:     Breath sounds: No wheezing, rhonchi or rales.  Chest:     Chest wall: No tenderness.  Abdominal:     Tenderness: There is no abdominal tenderness.  Musculoskeletal:     Right lower leg: No edema.     Left lower leg: No edema.  Skin:    General: Skin is warm.     Capillary Refill: Capillary refill takes less than 2 seconds.  Neurological:     Mental Status: She is alert and oriented to person, place, and time.     ED Results / Procedures / Treatments   Labs (all labs ordered are listed, but only abnormal results are displayed) Labs Reviewed  BASIC METABOLIC PANEL - Abnormal; Notable for the following components:      Result Value   Glucose, Bld 118 (*)    Calcium 8.7 (*)    All other components within normal limits  CBC - Abnormal; Notable for the following components:   Hemoglobin 10.5 (*)    MCH 23.9 (*)    MCHC 28.5 (*)    Platelets 475 (*)    All other components within normal limits  I-STAT BETA HCG BLOOD, ED ([facility], [facility], [facility] ONLY)  TROPONIN I (HIGH SENSITIVITY)  TROPONIN I (HIGH SENSITIVITY)    EKG EKG Interpretation  Date/Time:  Friday April 02 2020 07:20:19 EDT Ventricular Rate:  97 PR Interval:  154 QRS Duration: 86 QT Interval:  376 QTC  Calculation: 477 R Axis:   86 Text Interpretation: Sinus rhythm with occasional Premature ventricular complexes Possible Inferior infarct , age undetermined T wave abnormality, consider lateral ischemia Abnormal ECG No significant change since last tracing Confirmed by Davonna Belling [phone]) on 04/02/2020 11:48:14 AM   Radiology DG Chest 2 View  Result Date: 04/02/2020 CLINICAL DATA:  Chest pain EXAM: CHEST - 2 VIEW COMPARISON:  08/01/2019 FINDINGS: The heart size and mediastinal contours are within normal limits. Both lungs are clear. No visible pleural effusions or pneumothorax. The visualized skeletal structures are unremarkable. IMPRESSION: No active cardiopulmonary disease. Electronically Signed   By: Margaretha Sheffield MD   On: 04/02/2020 07:50    Procedures Procedures (including critical care time)  Medications Ordered in ED Medications - No data to display  ED Course  I have reviewed the triage vital signs and the nursing notes.  Pertinent labs & imaging results that were available during my care of the patient were reviewed by me and considered in my medical decision making (see chart for details).    MDM Rules/Calculators/A&P                          Patient with chest pain.  Went from neck to head at around 6:00 this morning.  EKG reassuring.  Troponin negative x2.  Pain-free.  I think she is low enough risk overall she can be worked up as an outpatient if needed.  Doubt acute coronary syndrome.  Doubt pulmonary illness.  X-ray reassuring.  Discharge home.  Reviewed imaging EKG and lab work. Final Clinical Impression(s) / ED Diagnoses Final diagnoses:  Nonspecific chest pain    Rx / DC Orders ED Discharge Orders    None       Davonna Belling, MD 04/02/20 1609

## 2020-04-02 NOTE — ED Triage Notes (Signed)
Pt here with CP radiating to her neck onset approx 1 hour PTA. Pt also endorses nausea. Denies cardiac hx.

## 2020-04-14 ENCOUNTER — Encounter: Payer: Self-pay | Admitting: Emergency Medicine

## 2020-04-14 ENCOUNTER — Ambulatory Visit
Admission: EM | Admit: 2020-04-14 | Discharge: 2020-04-14 | Disposition: A | Discharge status: Home / Self Care | Payer: Medicaid Other | Attending: Internal Medicine | Admitting: Internal Medicine

## 2020-04-14 ENCOUNTER — Ambulatory Visit: Payer: Medicaid Other

## 2020-04-14 ENCOUNTER — Other Ambulatory Visit: Payer: Self-pay

## 2020-04-14 DIAGNOSIS — B370 Candidal stomatitis: Secondary | ICD-10-CM | POA: Insufficient documentation

## 2020-04-14 LAB — POCT RAPID STREP A (OFFICE): Rapid Strep A Screen: NEGATIVE

## 2020-04-14 MED ORDER — NYSTATIN 100000 UNIT/ML MT SUSP: 500000.0000 [IU] | Freq: Four times a day (QID) | OROMUCOSAL | 0 refills | Status: AC

## 2020-04-14 MED ORDER — AZELASTINE HCL 0.1 % NA SOLN: 2.0000 | Freq: Two times a day (BID) | NASAL | 0 refills | Status: DC

## 2020-04-14 NOTE — ED Triage Notes (Signed)
Patient c/o sore throat and "taste bud swelling" x 2 days.   Patient denies fever or difficulty swallowing.   Patient endorses mucus in the back of throat.

## 2020-04-14 NOTE — Discharge Instructions (Signed)
Rapid strep negative. Start nystatin as directed. As discussed, cannot rule out post nasal drainage causing sore throat. Azelastine and flonase as directed. Follow up with dentist for further evaluation of your tooth. Monitor for any worsening of symptoms, swelling of the throat, trouble breathing, trouble swallowing, leaning forward to breath, drooling, go to the emergency department for further evaluation needed.

## 2020-04-14 NOTE — ED Provider Notes (Signed)
[facility]    CSN: [id number] Arrival date & time: 04/14/20  1151      History   Chief Complaint Chief Complaint  Patient presents with  . Sore Throat    HPI Karen Brennan is a 44 y.o. female.   44 year old female comes in for 2 day of sore throat. States taste bud feels swollen starting today. Denies rhinorrhea, nasal congestion, cough. Does have post nasal drip for the past 2 weeks. Denies fever, chills, body aches. Denies shortness of breath, loss of taste/smell. Denies tongue pain. Recently finished 10 day course of amoxicillin for dental infection.     Past Medical History:  Diagnosis Date  . ANEMIA 08/14/2008   Qualifier: Diagnosis of  By: Genene Churn MD, Janett Billow    . Anxiety   . Bipolar 1 disorder ([place])   . Blood transfusion   . Depression   . GERD (gastroesophageal reflux disease)   . History of bronchitis   . Hypertension   . Kidney stones 10/09/11  . Migraines 10/09/11   "often"    Patient Active Problem List   Diagnosis Date Noted  . Intertrigo 02/16/2020  . Tachycardia 07/10/2019  . Systolic murmur 20/35/5974  . H/O gastric bypass 07/10/2019  . Prediabetes 06/26/2016  . Neuropathic pain of both feet 05/09/2016  . Headache 05/09/2016  . 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 Healthcare maintenance 12/09/2014  . Seborrheic dermatitis 12/05/2013  . Fatigue 01/06/2013  . Anxiety 10/25/2011  . Bipolar 1 disorder, mixed ([place]) 09/15/2011  . Insomnia 10/21/2010  . Allergic rhinitis 08/31/2008  . Anemia 08/14/2008  . Esophageal reflux 01/24/2008  . HYPERTENSION, BENIGN ESSENTIAL 08/17/2006  . Sinusitis, chronic 08/17/2006  . Morbid obesity with BMI of 50.0-59.9, adult ([place]) 08/09/2006  . DEPRESSION, MAJOR, RECURRENT 08/09/2006    Past Surgical History:  Procedure Laterality Date  . ANKLE FRACTURE SURGERY   1990's   "left; put screws in"  . ENDOMETRIAL ABLATION  ~ 2011  . FRACTURE SURGERY    . GASTRIC BY PASS     . TUBAL LIGATION  2006    OB History   No obstetric history on file.      Home Medications    Prior to Admission medications   Medication Sig Start Date End Date Taking? Authorizing Provider  acetaminophen (TYLENOL) 500 MG tablet Take 1,000 mg by mouth every 6 (six) hours as needed for mild pain.   Yes [provider]  benztropine (COGENTIN) 1 MG tablet Take 1 mg by mouth 2 (two) times daily. 06/18/19  Yes [provider]  cetirizine (ZYRTEC ALLERGY) 10 MG tablet Take 1 tablet (10 mg total) by mouth daily. 02/12/20  Yes Benay Pike, MD  FLUoxetine (PROZAC) 40 MG capsule Take 40 mg by mouth daily.   Yes [provider]  fluticasone (FLONASE) 50 MCG/ACT nasal spray Place 1 spray into both nostrils daily. 02/12/20  Yes Benay Pike, MD  gabapentin (NEURONTIN) 300 MG capsule Take 1 capsule (300 mg total) by mouth 3 (three) times daily. 01/08/20  Yes Martyn Malay, MD  haloperidol (HALDOL) 2 MG tablet Take 2 mg by mouth daily.   Yes [provider]  hydrochlorothiazide (HYDRODIURIL) 25 MG tablet TAKE 1 TABLET BY MOUTH EVERY DAY Patient taking differently: Take 25 mg by mouth daily.  10/27/19  Yes Sherene Sires, DO  hydrOXYzine (ATARAX/VISTARIL) 25 MG tablet Take 25 mg by mouth in the morning and at bedtime.  Yes [provider]  ibuprofen (ADVIL) 800 MG tablet Take 800 mg by mouth every 8 (eight) hours as needed for moderate pain.   Yes [provider]  metFORMIN (GLUCOPHAGE-XR) 500 MG 24 hr tablet TAKE 1 TABLET BY MOUTH EVERY DAY WITH BREAKFAST 03/03/20  Yes Benay Pike, MD  Multiple Vitamin (MULTIVITAMIN WITH MINERALS) TABS tablet Take 1 tablet by mouth daily.   Yes [provider]  OLANZapine (ZYPREXA) 20 MG tablet Take 20 mg by mouth at bedtime.    Yes [provider]  traZODone (DESYREL) 50 MG tablet Take 50 mg by mouth at bedtime as needed for sleep. 04/07/19  Yes [provider]  valsartan (DIOVAN)  40 MG tablet Take 1 tablet (40 mg total) by mouth at bedtime. 02/12/20  Yes Benay Pike, MD  Accu-Chek Softclix Lancets lancets Use as instructed 02/18/20   Benay Pike, MD  albuterol (VENTOLIN HFA) 108 (90 Base) MCG/ACT inhaler Inhale 2 puffs into the lungs every 4 (four) hours as needed for wheezing or shortness of breath. 12/18/19   Hall-Potvin, [place], PA-C  azelastine (ASTELIN) 0.1 % nasal spray Place 2 sprays into both nostrils 2 (two) times daily. 04/14/20   Tasia Catchings, Raeann Offner V, PA-C  blood glucose meter kit and supplies KIT Dispense based on patient and insurance preference. Use up to four times daily as directed. (FOR ICD-9 250.00, 250.01). 03/11/18   Sherene Sires, DO  Blood Glucose Monitoring Suppl (ACCU-CHEK GUIDE) w/Device KIT Check blood glucose in AM while fasting 02/12/20   Benay Pike, MD  glucose blood (ACCU-CHEK GUIDE) test strip Use as instructed 02/12/20   Benay Pike, MD  Lancet Devices [facility]) lancets Use as instructed 02/16/17   Nicolette Bang, DO  nystatin (MYCOSTATIN) 100000 UNIT/ML suspension Take 5 mLs (500,000 Units total) by mouth 4 (four) times daily for 7 days. swish in the mouth and retain for as long as possible (several minutes) before swallowing 04/14/20 04/21/20  Ok Edwards, PA-C    Family History History reviewed. No pertinent family history.  Social History Social History   Tobacco Use  . Smoking status: Former Smoker    Packs/day: 0.25    Years: 4.00    Pack years: 1.00    Types: Cigarettes    Quit date: 06/12/2004    Years since quitting: 15.8  . Smokeless tobacco: Never Used  Substance Use Topics  . Alcohol use: No  . Drug use: No     Allergies   Sibutramine hcl monohydrate   Review of Systems Review of Systems  Reason unable to perform ROS: See HPI as above.     Physical Exam Triage Vital Signs ED Triage Vitals  Enc Vitals Group     BP 04/14/20 1214 134/76     Pulse Rate 04/14/20 1214 (!) 117     Resp 04/14/20  1214 16     Temp 04/14/20 1214 98.1 F (36.7 C)     Temp Source 04/14/20 1214 Oral     SpO2 04/14/20 1214 97 %     Weight --      Height --      Head Circumference --      Peak Flow --      Pain Score 04/14/20 1210 4     Pain Loc --      Pain Edu? --      Excl. in [place]? --    No data found.  Updated Vital Signs BP 134/76 (  BP Location: Right Arm)   Pulse (!) 117   Temp 98.1 F (36.7 C) (Oral)   Resp 16   LMP 03/31/2020 Comment: tubal ligation  SpO2 97%   Physical Exam Constitutional:      General: She is not in acute distress.    Appearance: Normal appearance. She is well-developed. She is not toxic-appearing or diaphoretic.  HENT:     Head: Normocephalic and atraumatic.     Right Ear: Tympanic membrane, ear canal and external ear normal.     Left Ear: Tympanic membrane, ear canal and external ear normal.     Nose:     Right Sinus: No maxillary sinus tenderness or frontal sinus tenderness.     Left Sinus: No maxillary sinus tenderness or frontal sinus tenderness.     Mouth/Throat:     Mouth: Mucous membranes are moist.     Pharynx: Oropharynx is clear. Uvula midline.     Tonsils: No tonsillar exudate.     Comments: White patches to bilateral side of the tongue that is unable to be removed with scraping. No tongue swelling. Patient handling own secretions well.  Eyes:     Conjunctiva/sclera: Conjunctivae normal.     Pupils: Pupils are equal, round, and reactive to light.  Cardiovascular:     Rate and Rhythm: Normal rate and regular rhythm.  Pulmonary:     Effort: Pulmonary effort is normal. No respiratory distress.     Comments: LCTAB Musculoskeletal:     Cervical back: Normal range of motion and neck supple.  Skin:    General: Skin is warm and dry.  Neurological:     Mental Status: She is alert and oriented to person, place, and time.      UC Treatments / Results  Labs (all labs ordered are listed, but only abnormal results are displayed) Labs Reviewed    CULTURE, GROUP A STREP [facility])  POCT RAPID STREP A (OFFICE)    EKG   Radiology No results found.  Procedures Procedures (including critical care time)  Medications Ordered in UC Medications - No data to display  Initial Impression / Assessment and Plan / UC Course  I have reviewed the triage vital signs and the nursing notes.  Pertinent labs & imaging results that were available during my care of the patient were reviewed by me and considered in my medical decision making (see chart for details).    Thrush noted to the tongue.  No thrush noted to the posterior oropharynx.  Patient handling own secretions well.  Will treat for thrush with nystatin.  Given with postnasal drip, will also add nasal sprays.  Push fluids.  Return precautions given.  Final Clinical Impressions(s) / UC Diagnoses   Final diagnoses:  Thrush, oral    ED Prescriptions    Medication Sig Dispense Auth. Provider   nystatin (MYCOSTATIN) 100000 UNIT/ML suspension Take 5 mLs (500,000 Units total) by mouth 4 (four) times daily for 7 days. swish in the mouth and retain for as long as possible (several minutes) before swallowing 60 mL Anai Lipson V, PA-C   azelastine (ASTELIN) 0.1 % nasal spray Place 2 sprays into both nostrils 2 (two) times daily. 30 mL Ok Edwards, PA-C     PDMP not reviewed this encounter.   Ok Edwards, PA-C 04/14/20 1335

## 2020-04-16 ENCOUNTER — Ambulatory Visit: Payer: Medicaid Other

## 2020-04-16 LAB — CULTURE, GROUP A STREP (THRC)

## 2020-06-29 ENCOUNTER — Other Ambulatory Visit: Payer: Medicaid Other

## 2020-06-30 ENCOUNTER — Other Ambulatory Visit: Payer: Medicaid Other

## 2020-06-30 DIAGNOSIS — Z20822 Contact with and (suspected) exposure to covid-19: Secondary | ICD-10-CM

## 2020-07-01 ENCOUNTER — Telehealth (INDEPENDENT_AMBULATORY_CARE_PROVIDER_SITE_OTHER): Payer: Medicaid Other | Admitting: Family Medicine

## 2020-07-01 ENCOUNTER — Other Ambulatory Visit: Payer: Self-pay

## 2020-07-01 DIAGNOSIS — R059 Cough, unspecified: Secondary | ICD-10-CM | POA: Diagnosis not present

## 2020-07-01 MED ORDER — GUAIFENESIN ER 600 MG PO TB12: 600.0000 mg | ORAL_TABLET | Freq: Two times a day (BID) | ORAL | 0 refills | Status: DC | PRN

## 2020-07-01 MED ORDER — BENZONATATE 200 MG PO CAPS: 200.0000 mg | ORAL_CAPSULE | Freq: Two times a day (BID) | ORAL | 0 refills | Status: DC | PRN

## 2020-07-01 NOTE — Progress Notes (Signed)
[facility] Telemedicine Visit  Patient consented to have virtual visit and was identified by name and date of birth. Method of visit: Video was attempted but was interrupted: <50% of visit completed via video  Encounter participants: Patient: Karen Brennan - located at Home  Provider: Allayne Stack - located at [facility] Others (if applicable): None   Chief Complaint: Cough  HPI: Karen Brennan is a 45 year old female presenting via telephone today to discuss cough.   Her daughter recently tested positive for COVID-19 about 5 days ago.  The patient now reports approximate 4-day history of cough and congestion.  She feels like "something wants to come up," but she has not been able to expectorate it.  She is already been COVID tested on 1/19 at a COVID testing site, waiting for this to return in the next 3-4 days.  She denies any associated fever, difficulty breathing, chest pain, confusion, sore throat, change in taste/smell.  Does feel little bit rundown but overall feeling "okay."  She is hoping to have something that might help get up the mucus and help with cough.  Pfizer x3, Booster done on 04/26/2020.    ROS: per HPI  Pertinent PMHx: Hypertension, elevated BMI, depression, anemia  Exam:  There were no vitals taken for this visit.  Respiratory: Able to speak in full sentences without difficulty Psych: Sounds alert, engages in normal conversation  Assessment/Plan:  Cough Overall syndrome with cough and congestion in the setting of exposure to her COVID-positive daughter sound suspicious for likely COVID-19 illness, however certainly could be other viral illness as well.  Reassured that she has been fully vaccinated and otherwise has mild symptoms now on day 4.  Will await COVID testing results in the next few days, instructed to call our office when this returns.  Rx'd Mucinex per patient request and Tessalon Perles for cough.  Encourage supportive care,  hydration, Tylenol.    ED precautions discussed including if any difficulty breathing, chest pain, confusion.  We will follow-up after results of COVID test.  Time spent during visit with patient: 8 minutes  Allayne Stack, DO

## 2020-07-01 NOTE — Assessment & Plan Note (Addendum)
Overall syndrome with cough and congestion in the setting of exposure to her COVID-positive daughter sound suspicious for likely COVID-19 illness, however certainly could be other viral illness as well.  Reassured that she has been fully vaccinated and otherwise has mild symptoms now on day 4.  Will await COVID testing results in the next few days, instructed to call our office when this returns.  Rx'd Mucinex per patient request and Tessalon Perles for cough.  Encourage supportive care, hydration, Tylenol.

## 2020-07-03 LAB — NOVEL CORONAVIRUS, NAA: SARS-CoV-2, NAA: DETECTED — AB

## 2020-07-08 ENCOUNTER — Ambulatory Visit (INDEPENDENT_AMBULATORY_CARE_PROVIDER_SITE_OTHER): Payer: Medicaid Other | Admitting: Family Medicine

## 2020-07-08 ENCOUNTER — Other Ambulatory Visit: Payer: Self-pay

## 2020-07-08 DIAGNOSIS — U071 COVID-19: Secondary | ICD-10-CM | POA: Insufficient documentation

## 2020-07-08 DIAGNOSIS — M546 Pain in thoracic spine: Secondary | ICD-10-CM

## 2020-07-08 DIAGNOSIS — M549 Dorsalgia, unspecified: Secondary | ICD-10-CM | POA: Insufficient documentation

## 2020-07-08 NOTE — Progress Notes (Signed)
    SUBJECTIVE:   CHIEF COMPLAINT / HPI: COVID-19 illness/back pain  Karen Brennan is a 45 year old female presenting for evaluation of back pain in the setting of known COVID-19 infection.  She tested positive for COVID-19 on 1/19, however started having symptoms for about 3 days prior to this.  She reports for the past few days she will have a pain in her upper left back whenever she takes in a deep breath, frequent coughing, or turning her back to her left.  States she cannot sleep on that side because it is too tender.  She has been more easily winded, however denies any shortness of breath or chest pain at rest.  Still having nasal congestion and frequent cough.  Tessalon Perles did not help, however Robitussin has been able to ease her cough some.  Has been taking Tylenol/ibuprofen for relief.  Not currently on contraception and is a non-smoker.  No personal or family history of blood clots.  Denies any leg swelling or prolonged trips recently.  PERTINENT  PMH / PSH: Elevated BMI 56, bipolar 1 disorder, depression, hypertension  OBJECTIVE:   BP 124/76   Pulse (!) 104   Temp 98.6 F (37 C) (Oral)   Wt (!) 440 lb (199.6 kg)   SpO2 99%   BMI 56.49 kg/m   General: Alert, NAD HEENT: NCAT, MMM, oropharynx nonerythematous, conjunctiva are clear Cardiac: RRR no m/g/r Lungs: Clear bilaterally, no increased WOB at rest, mild tachypnea after completing ambulation in the hall, no wheezing or rhonchi Abdomen: soft Msk: Moves all extremities spontaneously, nontender to palpation of left upper back however points to approximately ribs 5-7 on her back/axillary area on the left.  Elicits tenderness in this area with back rotation to the left. Ext: Warm, dry, 2+ distal pulses, no edema bilaterally with calves of equal size  Ambulated approximately 150-200 feet with CMA, pulse ox remained above 96%.   ASSESSMENT/PLAN:   Back pain Along posterior/lateral portion of ribs 5-7 (approximately) on the  left, provoked specifically with movement, frequent coughing/inspiration in the setting of known COVID-19 infection.  Reassuringly she is breathing comfortable on exam without any oxygen desaturations at rest or with ambulation.  Especially given reproducible with rotation to her left and laying on the side, suspect muscular/costochondral irritation due to frequent coughing.  Certainly consider PE given pleuritic nature and current infection, however given additional factors as above suspect this is less likely.  Well score for PE 1.5 due to including mild tachycardia, low risk.  Recommended ice/heat, Tylenol/ibuprofen, and hydration.  Continue Robitussin as needed to minimize cough.   COVID-19 Mild illness, set expectations that her congestion and cough will likely linger for the next several weeks.  While she does have several comorbidities, she is now over day 10 of illness and would not qualify for monoclonal antibodies.  Recommended conservative care.    Discussed the above with patient and urged if she has any worsening of symptoms including difficulty breathing, chest pain, palpitations, or pain more severe with coughing/inspiration to seek evaluation in the ED.  She understands and agrees with this plan.  Allayne Stack, DO [facility]

## 2020-07-08 NOTE — Assessment & Plan Note (Signed)
Mild illness, set expectations that her congestion and cough will likely linger for the next several weeks.  While she does have several comorbidities, she is now over day 10 of illness and would not qualify for monoclonal antibodies.  Recommended conservative care.

## 2020-07-08 NOTE — Patient Instructions (Signed)
IT Is likely the pain in your upper back is muscular due to your frequent coughing, it is likely irritated the muscles within your ribs.  You can use Tylenol 1000 mg up to 4 times a day in addition to ibuprofen as needed.  I recommend heat and/or ice 20 minutes at a time several times a day.  Continue Robitussin as needed for your cough, we would like to reduce your cough so that you irritate these muscles less often.  It is very important that if you feel like your shortness of breath is getting worse or the pain becomes more severe, that you go to the ED.

## 2020-07-08 NOTE — Assessment & Plan Note (Signed)
Along posterior/lateral portion of ribs 5-7 (approximately) on the left, provoked specifically with movement, frequent coughing/inspiration in the setting of known COVID-19 infection.  Reassuringly she is breathing comfortable on exam without any oxygen desaturations at rest or with ambulation.  Especially given reproducible with rotation to her left and laying on the side, suspect muscular/costochondral irritation due to frequent coughing.  Certainly consider PE given pleuritic nature and current infection, however given additional factors as above suspect this is less likely.  Well score for PE 1.5 due to including mild tachycardia, low risk.  Recommended ice/heat, Tylenol/ibuprofen, and hydration.  Continue Robitussin as needed to minimize cough.

## 2020-07-08 NOTE — Progress Notes (Signed)
Ambulated patient. SpO2 remained stable at 96 %. Patient began to complain of weakness with mild SHOB once we were almost back to room. HR slightly elevated in low 100s.   Veronda Prude, RN

## 2020-07-10 ENCOUNTER — Other Ambulatory Visit: Payer: Self-pay | Admitting: Family Medicine

## 2020-07-10 DIAGNOSIS — R7303 Prediabetes: Secondary | ICD-10-CM

## 2020-07-14 ENCOUNTER — Other Ambulatory Visit: Payer: Self-pay

## 2020-07-14 ENCOUNTER — Ambulatory Visit
Admission: EM | Admit: 2020-07-14 | Discharge: 2020-07-14 | Disposition: A | Discharge status: Home / Self Care | Payer: Medicaid Other | Attending: Urgent Care | Admitting: Urgent Care

## 2020-07-14 DIAGNOSIS — M7918 Myalgia, other site: Secondary | ICD-10-CM

## 2020-07-14 DIAGNOSIS — L03317 Cellulitis of buttock: Secondary | ICD-10-CM

## 2020-07-14 MED ORDER — NAPROXEN 375 MG PO TABS: 375.0000 mg | ORAL_TABLET | Freq: Two times a day (BID) | ORAL | 0 refills | Status: DC

## 2020-07-14 MED ORDER — CEPHALEXIN 500 MG PO CAPS: 500.0000 mg | ORAL_CAPSULE | Freq: Four times a day (QID) | ORAL | 0 refills | Status: DC

## 2020-07-14 NOTE — ED Provider Notes (Signed)
[facility]   MRN: [id number] DOB: Nov 04, 1975  Subjective:   Karen Brennan is a 45 y.o. female presenting for 2-day history of acute onset redness and pain of her right butt cheek.  Pain has been mild.  Denies fever, nausea, vomiting, anal pain, pelvic pain.  No current facility-administered medications for this encounter.  Current Outpatient Medications:  .  Accu-Chek Softclix Lancets lancets, Use as instructed, Disp: 100 each, Rfl: 12 .  acetaminophen (TYLENOL) 500 MG tablet, Take 1,000 mg by mouth every 6 (six) hours as needed for mild pain., Disp: , Rfl:  .  albuterol (VENTOLIN HFA) 108 (90 Base) MCG/ACT inhaler, Inhale 2 puffs into the lungs every 4 (four) hours as needed for wheezing or shortness of breath., Disp: 18 g, Rfl: 0 .  azelastine (ASTELIN) 0.1 % nasal spray, Place 2 sprays into both nostrils 2 (two) times daily., Disp: 30 mL, Rfl: 0 .  benztropine (COGENTIN) 1 MG tablet, Take 1 mg by mouth 2 (two) times daily., Disp: , Rfl:  .  blood glucose meter kit and supplies KIT, Dispense based on patient and insurance preference. Use up to four times daily as directed. (FOR ICD-9 250.00, 250.01)., Disp: 1 each, Rfl: 0 .  Blood Glucose Monitoring Suppl (ACCU-CHEK GUIDE) w/Device KIT, Check blood glucose in AM while fasting, Disp: 1 kit, Rfl: 0 .  cetirizine (ZYRTEC ALLERGY) 10 MG tablet, Take 1 tablet (10 mg total) by mouth daily., Disp: 30 tablet, Rfl: 0 .  FLUoxetine (PROZAC) 40 MG capsule, Take 40 mg by mouth daily., Disp: , Rfl:  .  fluticasone (FLONASE) 50 MCG/ACT nasal spray, Place 1 spray into both nostrils daily., Disp: 16 g, Rfl: 0 .  gabapentin (NEURONTIN) 300 MG capsule, Take 1 capsule (300 mg total) by mouth 3 (three) times daily., Disp: 90 capsule, Rfl: 3 .  glucose blood (ACCU-CHEK GUIDE) test strip, Use as instructed, Disp: 100 each, Rfl: 12 .  guaiFENesin (MUCINEX) 600 MG 12 hr tablet, Take 1 tablet (600 mg total) by mouth 2 (two) times daily as needed.,  Disp: 30 tablet, Rfl: 0 .  haloperidol (HALDOL) 2 MG tablet, Take 2 mg by mouth daily., Disp: , Rfl:  .  hydrochlorothiazide (HYDRODIURIL) 25 MG tablet, TAKE 1 TABLET BY MOUTH EVERY DAY (Patient taking differently: Take 25 mg by mouth daily.), Disp: 90 tablet, Rfl: 4 .  hydrOXYzine (ATARAX/VISTARIL) 25 MG tablet, Take 25 mg by mouth in the morning and at bedtime. , Disp: , Rfl:  .  ibuprofen (ADVIL) 800 MG tablet, Take 800 mg by mouth every 8 (eight) hours as needed for moderate pain., Disp: , Rfl:  .  Lancet Devices (ACCU-CHEK SOFTCLIX) lancets, Use as instructed, Disp: 1 each, Rfl: 0 .  metFORMIN (GLUCOPHAGE-XR) 500 MG 24 hr tablet, TAKE 1 TABLET BY MOUTH EVERY DAY WITH BREAKFAST, Disp: 90 tablet, Rfl: 0 .  Multiple Vitamin (MULTIVITAMIN WITH MINERALS) TABS tablet, Take 1 tablet by mouth daily., Disp: , Rfl:  .  OLANZapine (ZYPREXA) 20 MG tablet, Take 20 mg by mouth at bedtime. , Disp: , Rfl:  .  traZODone (DESYREL) 50 MG tablet, Take 50 mg by mouth at bedtime as needed for sleep., Disp: , Rfl:  .  valsartan (DIOVAN) 40 MG tablet, Take 1 tablet (40 mg total) by mouth at bedtime., Disp: 90 tablet, Rfl: 3 .  benzonatate (TESSALON) 200 MG capsule, Take 1 capsule (200 mg total) by mouth 2 (two) times daily as needed for cough., Disp: 30 capsule,  Rfl: 0   Allergies  Allergen Reactions  . Sibutramine Hcl Monohydrate Hives    Past Medical History:  Diagnosis Date  . ANEMIA 08/14/2008   Qualifier: Diagnosis of  By: Genene Churn MD, Janett Billow    . Anxiety   . Bipolar 1 disorder ([place])   . Blood transfusion   . Depression   . GERD (gastroesophageal reflux disease)   . History of bronchitis   . Hypertension   . Kidney stones 10/09/11  . Migraines 10/09/11   "often"     Past Surgical History:  Procedure Laterality Date  . ANKLE FRACTURE SURGERY   1990's   "left; put screws in"  . ENDOMETRIAL ABLATION  ~ 2011  . FRACTURE SURGERY    . GASTRIC BY PASS    . TUBAL LIGATION  2006    History reviewed.  No pertinent family history.  Social History   Tobacco Use  . Smoking status: Former Smoker    Packs/day: 0.25    Years: 4.00    Pack years: 1.00    Types: Cigarettes    Quit date: 06/12/2004    Years since quitting: 16.0  . Smokeless tobacco: Never Used  Vaping Use  . Vaping Use: Never used  Substance Use Topics  . Alcohol use: No  . Drug use: No    ROS   Objective:   Vitals: BP (!) 136/92 (BP Location: Left Arm)   Pulse 100   Temp 98.1 F (36.7 C) (Oral)   Resp 20   SpO2 98%   Physical Exam Constitutional:      General: She is not in acute distress.    Appearance: Normal appearance. She is well-developed. She is not ill-appearing.  HENT:     Head: Normocephalic and atraumatic.     Nose: Nose normal.     Mouth/Throat:     Mouth: Mucous membranes are moist.     Pharynx: Oropharynx is clear.  Eyes:     General: No scleral icterus.    Extraocular Movements: Extraocular movements intact.     Pupils: Pupils are equal, round, and reactive to light.  Cardiovascular:     Rate and Rhythm: Normal rate.  Pulmonary:     Effort: Pulmonary effort is normal.  Skin:    General: Skin is warm and dry.          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  Neurological:     General: No focal deficit present.     Mental Status: She is alert and oriented to person, place, and time.  Psychiatric:        Mood and Affect: Mood normal.        Behavior: Behavior normal.      Assessment and Plan :   PDMP not reviewed this encounter.  1. Cellulitis of right buttock   2. Buttock pain     Start Keflex 4 times daily to cover for cellulitis.  Recommended naproxen for pain and inflammation.  No sign of abscess. Counseled patient on potential for adverse effects with medications prescribed/recommended today, ER and return-to-clinic precautions discussed, patient verbalized understanding.    Jaynee Eagles, PA-C 07/14/20 [id number]

## 2020-07-14 NOTE — ED Triage Notes (Signed)
Patient states she has had redness on her right butt cheek as well as mild pain x 2 days. Pt is ax4 and ambulatory.

## 2020-07-15 ENCOUNTER — Encounter: Payer: Self-pay | Admitting: Family Medicine

## 2020-07-15 ENCOUNTER — Other Ambulatory Visit: Payer: Self-pay

## 2020-07-15 ENCOUNTER — Ambulatory Visit (INDEPENDENT_AMBULATORY_CARE_PROVIDER_SITE_OTHER): Payer: Medicaid Other | Admitting: Family Medicine

## 2020-07-15 DIAGNOSIS — L03818 Cellulitis of other sites: Secondary | ICD-10-CM

## 2020-07-15 DIAGNOSIS — L0390 Cellulitis, unspecified: Secondary | ICD-10-CM

## 2020-07-15 DIAGNOSIS — R5383 Other fatigue: Secondary | ICD-10-CM | POA: Diagnosis present

## 2020-07-15 HISTORY — DX: Cellulitis, unspecified: L03.90

## 2020-07-15 NOTE — Assessment & Plan Note (Addendum)
Assessment: Patient is 45 year old female with recent urgent care visit yesterday for cellulitis of the right buttock.  At that time she was started on Keflex with erythema and warmth noted on the physical exam.  She states that since that time her symptoms have improved.  Physical exam today is very reassuring with no erythema, no warmth, no obvious drainable area/abscess on palpation.  She only has a small amount of roughened skin which is difficult to appreciate until the patient points it out.  No visual skin changes. Plan: -Continue Keflex as prescribed -Return precautions given

## 2020-07-15 NOTE — Assessment & Plan Note (Addendum)
Assessment: 45 year old female presenting with 1 day of fatigue.  She was recently treated yesterday in the urgent care for cellulitis of the right buttock which seems to be healing well.  She states that she took her normal hydroxyzine last night and this morning but also took a Benadryl with it last night and this morning due to ongoing allergy symptoms.  She also uses Flonase and Zyrtec for allergies.  Overall differential for fatigue is broad but in this case is most likely due to medication adverse effects as she took hydroxyzine plus Benadryl both last night and today.  She is unsure if she slept well last night which is also likely impacted by the combination of these 2 medications.  No obvious red flag symptoms on physical exam today.  Pulse was initially tachycardic but improved on recheck and blood pressure was initially elevated though I think this was an aberrant read and was improved on recheck at 124/94.  Patient with pulse ox of 100% and afebrile during this encounter.  With no complaints of chest pain or shortness of breath and simply fatigue I believe medication adverse effect is most likely to blame here. Plan: -Recommend discontinuing Benadryl -Discussed in detail with patient return precautions -Patient to return early next week if her fatigue symptoms do not improve but I expect that they will get much better after discontinuing the Benadryl.

## 2020-07-15 NOTE — Patient Instructions (Signed)
It was great to see you! Thank you for allowing me to participate in your care!  Our plans for today:  -The area on your right buttock appears to be improving well after starting the antibiotics.  Please continue the antibiotics for the entire course as prescribed. -If you develop any fevers, worsening pain or redness in the area, or any other concerning symptoms do not hesitate to return. -For your fatigability this is most likely due to come find an hydroxyzine plus Benadryl.  Please stop taking the Benadryl as this is likely the main cause of your fatigue.  If your fatigue does not improve in the next 1 to 2 days I would like for you to come back for a follow-up appointment.  If you develop any chest pains, trouble breathing, fevers, or other concerning symptoms please return sooner or go to the emergency department.  Take care and seek immediate care sooner if you develop any concerns.   Dr. Jackelyn Poling, DO [facility]

## 2020-07-15 NOTE — Progress Notes (Addendum)
SUBJECTIVE:   CHIEF COMPLAINT / HPI:   "Red spot on buttock": Patient is a 45 year old female that presents today for a red lesion present on her right buttock that she states has been there for a few days.  She states this has felt warm to the touch.  She was seen in urgent care yesterday and diagnosed with cellulitis of the right buttock with recommendation start Keflex 4 times a day to cover for cellulitis.  At that time there was no signs of abscess or obvious areas to drain.  She was also recommended to use naproxen for pain and inflammation from urgent care. She denies fever.  She states that in general the area seems to be improved and does not feel as warm as it was after starting the antibiotic.    Fatigue: Patient states that throughout today she has felt more fatigued which is the reason she kept her appointment.  She states that she sometimes does feel tired but today felt unusually fatigued.  She does not know the reason this may be but does state that she got over COVID-19 infection a few weeks ago. She took her normal hydroxyzine last night and this morning plus extra benadryl last night and this morning for allergies and felt more fatigued today.  She is unsure if she slept well last night but states that she normally has some difficulty sleeping.  She denies other symptoms at this time.  PERTINENT  PMH / PSH: Recent visit in urgent care for cellulitis of the right buttock  OBJECTIVE:   BP (!) 124/94   Pulse (!) 117   Temp 98.4 F (36.9 C) (Oral)   Ht 6\' 2"  (1.88 m)   Wt (!) 449 lb 9.6 oz (203.9 kg)   SpO2 100%   BMI 57.73 kg/m    Pulse recheck 96 BP recheck 124/94  General: NAD, pleasant, able to participate in exam Cardiac: RRR, no murmurs. Respiratory: No respiratory distress, clear to auscultation bilaterally Skin: warm and dry, right buttock with small 1 cm area of roughened skin with no erythema, no drainage, no palpable fluid underneath the skin.  This area  was difficult to appreciate until the patient pointed it out, and only has minor skin texture changes compared to the surrounding area.  No obvious signs of abscess. Neuro: alert, no obvious focal deficits Psych: Normal affect and mood  ASSESSMENT/PLAN:   Fatigue Assessment: 45 year old female presenting with 1 day of fatigue.  She was recently treated yesterday in the urgent care for cellulitis of the right buttock which seems to be healing well.  She states that she took her normal hydroxyzine last night and this morning but also took a Benadryl with it last night and this morning due to ongoing allergy symptoms.  She also uses Flonase and Zyrtec for allergies.  Overall differential for fatigue is broad but in this case is most likely due to medication adverse effects as she took hydroxyzine plus Benadryl both last night and today.  She is unsure if she slept well last night which is also likely impacted by the combination of these 2 medications.  No obvious red flag symptoms on physical exam today.  Pulse was initially tachycardic but improved on recheck and blood pressure was initially elevated though I think this was an aberrant read and was improved on recheck at 124/94.  Patient with pulse ox of 100% and afebrile during this encounter.  With no complaints of chest pain or shortness of  breath and simply fatigue I believe medication adverse effect is most likely to blame here. Plan: -Recommend discontinuing Benadryl -Discussed in detail with patient return precautions -Patient to return early next week if her fatigue symptoms do not improve but I expect that they will get much better after discontinuing the Benadryl.  Cellulitis Assessment: Patient is 45 year old female with recent urgent care visit yesterday for cellulitis of the right buttock.  At that time she was started on Keflex with erythema and warmth noted on the physical exam.  She states that since that time her symptoms have  improved.  Physical exam today is very reassuring with no erythema, no warmth, no obvious drainable area/abscess on palpation.  She only has a small amount of roughened skin which is difficult to appreciate until the patient points it out.  No visual skin changes. Plan: -Continue Keflex as prescribed -Return precautions given    Jackelyn Poling, DO [facility]    This note was prepared using Dragon voice recognition software and may include unintentional dictation errors due to the inherent limitations of voice recognition software.

## 2020-07-26 ENCOUNTER — Other Ambulatory Visit: Payer: Self-pay | Admitting: Family Medicine

## 2020-07-26 DIAGNOSIS — R7303 Prediabetes: Secondary | ICD-10-CM

## 2020-07-28 ENCOUNTER — Other Ambulatory Visit: Payer: Self-pay | Admitting: *Deleted

## 2020-07-28 DIAGNOSIS — G5793 Unspecified mononeuropathy of bilateral lower limbs: Secondary | ICD-10-CM

## 2020-07-28 MED ORDER — GABAPENTIN 300 MG PO CAPS: 300.0000 mg | ORAL_CAPSULE | Freq: Three times a day (TID) | ORAL | 3 refills | Status: DC

## 2020-09-13 ENCOUNTER — Other Ambulatory Visit: Payer: Self-pay | Admitting: Family Medicine

## 2020-09-28 ENCOUNTER — Other Ambulatory Visit: Payer: Self-pay | Admitting: Family Medicine

## 2020-09-28 ENCOUNTER — Telehealth: Payer: Self-pay | Admitting: *Deleted

## 2020-09-28 NOTE — Telephone Encounter (Signed)
Received fax stating that pharmacy spoke to patient about diabetes care and noticed that  Pt has not filled a statin therapy at [facility] in the last 181 days. States that pt would like for them to reach out to PCP to see if it is appropriate to start a statin therapy.  If this is the case please send in Rx if not please disregard this.Rydell Wiegel Zimmerman Rumple, CMA

## 2020-10-07 NOTE — Telephone Encounter (Signed)
The only notes I see regarding statins are from 2018 when she was told to stop due to possible myalgias.  From what I can tell she was never restarted.  If she would like to discuss attempting to restart a statin with me she can schedule an appointment.

## 2020-10-21 ENCOUNTER — Other Ambulatory Visit: Payer: Self-pay | Admitting: Family Medicine

## 2020-11-11 ENCOUNTER — Encounter: Payer: Self-pay | Admitting: Cardiology

## 2020-11-11 ENCOUNTER — Ambulatory Visit (INDEPENDENT_AMBULATORY_CARE_PROVIDER_SITE_OTHER): Payer: Medicaid Other

## 2020-11-11 ENCOUNTER — Other Ambulatory Visit: Payer: Self-pay

## 2020-11-11 ENCOUNTER — Ambulatory Visit (INDEPENDENT_AMBULATORY_CARE_PROVIDER_SITE_OTHER): Payer: Medicaid Other | Admitting: Cardiology

## 2020-11-11 VITALS — BP 141/93 | HR 110 | Ht 74.0 in | Wt >= 6400 oz

## 2020-11-11 DIAGNOSIS — I493 Ventricular premature depolarization: Secondary | ICD-10-CM | POA: Diagnosis not present

## 2020-11-11 DIAGNOSIS — R400 Somnolence: Secondary | ICD-10-CM | POA: Diagnosis not present

## 2020-11-11 DIAGNOSIS — I10 Essential (primary) hypertension: Secondary | ICD-10-CM

## 2020-11-11 DIAGNOSIS — E785 Hyperlipidemia, unspecified: Secondary | ICD-10-CM | POA: Diagnosis not present

## 2020-11-11 DIAGNOSIS — Z6841 Body Mass Index (BMI) 40.0 and over, adult: Secondary | ICD-10-CM

## 2020-11-11 MED ORDER — VALSARTAN 80 MG PO TABS
80.0000 mg | ORAL_TABLET | Freq: Every day | ORAL | 3 refills | Status: DC
Start: 2020-11-11 — End: 2021-11-16

## 2020-11-11 NOTE — Patient Instructions (Addendum)
Medication Instructions:  INCREASE valsartan to 80 mg daily  Please check your blood pressure at home twice daily, write it down.  Call the office or send message via Mychart with the readings in 1-2 weeks for Dr. Bjorn Pippin to review.   *If you need a refill on your cardiac medications before your next appointment, please call your pharmacy*   Lab Work: CMET, CBC, TSH, Lipid today  If you have labs (blood work) drawn today and your tests are completely normal, you will receive your results only by: Marland Kitchen MyChart Message (if you have MyChart) OR . A paper copy in the mail If you have any lab test that is abnormal or we need to change your treatment, we will call you to review the results.   Testing/Procedures: Your physician has recommended that you have a sleep study. This test records several body functions during sleep, including: brain activity, eye movement, oxygen and carbon dioxide blood levels, heart rate and rhythm, breathing rate and rhythm, the flow of air through your mouth and nose, snoring, body muscle movements, and chest and belly movement.  ZIO XT- Long Term Monitor Instructions   Your physician has requested you wear a ZIO patch monitor for _3__ days.  This is a single patch monitor.   [employer] supplies one patch monitor per enrollment. Additional stickers are not available. Please do not apply patch if you will be having a Nuclear Stress Test, Echocardiogram, Cardiac CT, MRI, or Chest Xray during the period you would be wearing the monitor. The patch cannot be worn during these tests. You cannot remove and re-apply the ZIO XT patch monitor.  Your ZIO patch monitor will be sent Fed Ex from [employer] directly to your home address. It may take 3-5 days to receive your monitor after you have been enrolled.  Once you have received your monitor, please review the enclosed instructions. Your monitor has already been registered assigning a specific monitor serial # to  you.  Billing and Patient Assistance Program Information   We have supplied [employer] with any of your insurance information on file for billing purposes. [employer] offers a sliding scale Patient Assistance Program for patients that do not have insurance, or whose insurance does not completely cover the cost of the ZIO monitor.   You must apply for the Patient Assistance Program to qualify for this discounted rate.     To apply, please call [employer] at [phone], select option 4, then select option 2, and ask to apply for Patient Assistance Program.  Meredeth Ide will ask your household income, and how many people are in your household.  They will quote your out-of-pocket cost based on that information.  [employer] will also be able to set up a 31-month, interest-free payment plan if needed.  Applying the monitor   Shave hair from upper left chest.  Hold abrader disc by orange tab. Rub abrader in 40 strokes over the upper left chest as indicated in your monitor instructions.  Clean area with 4 enclosed alcohol pads. Let dry.  Apply patch as indicated in monitor instructions. Patch will be placed under collarbone on left side of chest with arrow pointing upward.  Rub patch adhesive wings for 2 minutes. Remove white label marked "1". Remove the white label marked "2". Rub patch adhesive wings for 2 additional minutes.  While looking in a mirror, press and release button in center of patch. A small green light will flash 3-4 times. This will be your only indicator that the  monitor has been turned on. ?  Do not shower for the first 24 hours. You may shower after the first 24 hours.  Press the button if you feel a symptom. You will hear a small click. Record Date, Time and Symptom in the Patient Logbook.  When you are ready to remove the patch, follow instructions on the last 2 pages of the Patient Logbook. Stick patch monitor onto the last page of Patient Logbook.  Place Patient Logbook in the blue and white  box.  Use locking tab on box and tape box closed securely.  The blue and white box has prepaid postage on it. Please place it in the mailbox as soon as possible. Your physician should have your test results approximately 7 days after the monitor has been mailed back to [facility].  Call [facility] Customer Care at [phone] if you have questions regarding your ZIO XT patch monitor. Call them immediately if you see an orange light blinking on your monitor.  If your monitor falls off in less than 4 days, contact our Monitor department at [phone]. ?If your monitor becomes loose or falls off after 4 days call [employer] at [phone] for suggestions on securing your monitor.?  Follow-Up: At [facility], you and your health needs are our priority.  As part of our continuing mission to provide you with exceptional heart care, we have created designated Provider Care Teams.  These Care Teams include your primary Cardiologist (physician) and Advanced Practice Providers (APPs -  Physician Assistants and Nurse Practitioners) who all work together to provide you with the care you need, when you need it.  We recommend signing up for the patient portal called "MyChart".  Sign up information is provided on this After Visit Summary.  MyChart is used to connect with patients for Virtual Visits (Telemedicine).  Patients are able to view lab/test results, encounter notes, upcoming appointments, etc.  Non-urgent messages can be sent to your provider as well.   To learn more about what you can do with MyChart, go to [email or web address].    Your next appointment:   6 month(s)  The format for your next appointment:   In Person  Provider:   Epifanio Lesches, MD   Other Instructions You have been referred to [facility]  --they will contact you to get appointment scheduled

## 2020-11-11 NOTE — Progress Notes (Unsigned)
Patient enrolled for [employer] to mail a 3 day ZIO XT to her home. 

## 2020-11-11 NOTE — Progress Notes (Signed)
Cardiology Office Note:    Date:  11/11/2020   ID:  Karen Brennan, DOB May 03, 1976, MRN [id number]  PCP:  Benay Pike, MD  Cardiologist:  None  Electrophysiologist:  None   Referring MD: Benay Pike, MD   Chief Complaint  Patient presents with  . Palpitations    History of Present Illness:    Karen Brennan is a 45 y.o. female with a hx of bipolar disorder, hypertension, prediabetes, anemia, obesity status post gastric bypass who presents for follow-up.  She was referred by Dr. Nori Riis for an initial evaluation of tachycardia, initially seen on 07/14/2019.  She was seen by Dr. Criss Rosales and Dr. Nori Riis on 07/09/2019, reported worsening exercise intolerance and episodes of tachycardia.  Reports that she has episodes where she feels like her heart is racing, will last about 5 minutes and resolve.  Happening about once per month or so.  She reports that she does exercise videos and walks, but becomes fatigued after a few minutes.  States that she gets short of breath with minimal exertion.  She does report that she gets occasional chest pain, which she describes as aching pain in center of her chest.  Episodes occur at rest, denies any chest pain when walking or doing her exercise videos.  Pain lasts a few minutes and resolves.  Smoked 1ppd x 2 years, quit in 2006.  Mother had MI in 39s.    TTE on 07/25/2019 showed normal biventricular function, no significant valvular disease.  Zio patch x14 days showed occasional PVCs (1.7% of beats).  Today, she is doing well.  She states that she experiences heart palpitations that last for seconds once it begins. She states that she is also experiences lightheadedness when she stands. She also reports that she has to take deep breaths because she has SOB when she exerts herself.  She is not currently exercising. She denies any chest pain, syncope, or lower extremity edema.   Past Medical History:  Diagnosis Date  . ANEMIA 08/14/2008   Qualifier: Diagnosis of  By:  Genene Churn MD, Janett Billow    . Anxiety   . Bipolar 1 disorder ([place])   . Blood transfusion   . Depression   . GERD (gastroesophageal reflux disease)   . History of bronchitis   . Hypertension   . Kidney stones 10/09/11  . Migraines 10/09/11   "often"    Past Surgical History:  Procedure Laterality Date  . ANKLE FRACTURE SURGERY   1990's   "left; put screws in"  . ENDOMETRIAL ABLATION  ~ 2011  . FRACTURE SURGERY    . GASTRIC BY PASS    . TUBAL LIGATION  2006    Current Medications: Current Meds  Medication Sig  . Accu-Chek Softclix Lancets lancets Use as instructed  . acetaminophen (TYLENOL) 500 MG tablet Take 1,000 mg by mouth every 6 (six) hours as needed for mild pain.  Marland Kitchen albuterol (VENTOLIN HFA) 108 (90 Base) MCG/ACT inhaler Inhale 2 puffs into the lungs every 4 (four) hours as needed for wheezing or shortness of breath.  Marland Kitchen azelastine (ASTELIN) 0.1 % nasal spray Place 2 sprays into both nostrils 2 (two) times daily.  . benztropine (COGENTIN) 1 MG tablet Take 1 mg by mouth 2 (two) times daily.  . blood glucose meter kit and supplies KIT Dispense based on patient and insurance preference. Use up to four times daily as directed. (FOR ICD-9 250.00, 250.01).  . Blood Glucose Monitoring Suppl (ACCU-CHEK GUIDE) w/Device KIT Check  blood glucose in AM while fasting  . cephALEXin (KEFLEX) 500 MG capsule Take 1 capsule (500 mg total) by mouth 4 (four) times daily.  . cetirizine (ZYRTEC) 10 MG tablet TAKE 1 TABLET BY MOUTH EVERY DAY  . clotrimazole (LOTRIMIN) 1 % cream APPLY TO AFFECTED AREA TWICE A DAY  . FLUoxetine (PROZAC) 40 MG capsule Take 40 mg by mouth daily.  . fluticasone (FLONASE) 50 MCG/ACT nasal spray SPRAY 1 SPRAY INTO BOTH NOSTRILS DAILY.  Marland Kitchen gabapentin (NEURONTIN) 300 MG capsule Take 1 capsule (300 mg total) by mouth 3 (three) times daily.  Marland Kitchen glucose blood (ACCU-CHEK GUIDE) test strip Use as instructed  . haloperidol (HALDOL) 2 MG tablet Take 2 mg by mouth daily.  .  hydrochlorothiazide (HYDRODIURIL) 25 MG tablet TAKE 1 TABLET BY MOUTH EVERY DAY (Patient taking differently: Take 25 mg by mouth daily.)  . hydrOXYzine (VISTARIL) 25 MG capsule Take 25 mg by mouth every evening. Every evening as needed  . ibuprofen (ADVIL) 800 MG tablet Take 800 mg by mouth every 8 (eight) hours as needed for moderate pain.  Elmore Guise Devices (ACCU-CHEK SOFTCLIX) lancets Use as instructed  . metFORMIN (GLUCOPHAGE-XR) 500 MG 24 hr tablet TAKE 1 TABLET BY MOUTH EVERY DAY WITH BREAKFAST  . Multiple Vitamin (MULTIVITAMIN WITH MINERALS) TABS tablet Take 1 tablet by mouth daily.  . naproxen (NAPROSYN) 375 MG tablet Take 1 tablet (375 mg total) by mouth 2 (two) times daily with a meal.  . OLANZapine (ZYPREXA) 20 MG tablet Take 20 mg by mouth at bedtime.   . traZODone (DESYREL) 50 MG tablet Take 50 mg by mouth at bedtime as needed for sleep.  . [DISCONTINUED] valsartan (DIOVAN) 40 MG tablet Take 1 tablet (40 mg total) by mouth at bedtime.     Allergies:   Sibutramine hcl monohydrate   Social History   Socioeconomic History  . Marital status: Single    Spouse name: Not on file  . Number of children: Not on file  . Years of education: Not on file  . Highest education level: Not on file  Occupational History  . Not on file  Tobacco Use  . Smoking status: Former Smoker    Packs/day: 0.25    Years: 4.00    Pack years: 1.00    Types: Cigarettes    Quit date: 06/12/2004    Years since quitting: 16.4  . Smokeless tobacco: Never Used  Vaping Use  . Vaping Use: Never used  Substance and Sexual Activity  . Alcohol use: No  . Drug use: No  . Sexual activity: Yes    Birth control/protection: None    Comment: Novasure  Other Topics Concern  . Not on file  Social History Narrative  . Not on file   Social Determinants of Health   Financial Resource Strain: Not on file  Food Insecurity: Not on file  Transportation Needs: Not on file  Physical Activity: Not on file  Stress:  Not on file  Social Connections: Not on file     Family History: Mother had MI in 53s  ROS:   Please see the history of present illness.     All other systems reviewed and are negative.  EKGs/Labs/Other Studies Reviewed:    The following studies were reviewed today:   EKG:   11/11/2020- The EKG ordered demonstrates sinus tachycardia, rate 110, frequent PVCs 10/31/2019- The ekg ordered most recently demonstrates sinus tachycardia, rate 183, nonspecific T wave flattening  TTE 07/25/19: 1. Left ventricular  ejection fraction, by estimation, is 60 to 65%. The  left ventricle has normal function. The left ventricle has no regional  wall motion abnormalities. Left ventricular diastolic parameters are  indeterminate.  2. Right ventricular systolic function is normal. The right ventricular  size is normal. Tricuspid regurgitation signal is inadequate for assessing  PA pressure.  3. The mitral valve is normal in structure and function. Trivial mitral  valve regurgitation. No evidence of mitral stenosis.  4. The aortic valve was not well visualized for morphology but is grossly  normal in function. Aortic valve regurgitation is not visualized. No  aortic stenosis is present.   Cardiac monitor 08/21/19:  No significant arrhythmias detected  Occasional PVCs (1.7% of beats). Longest bigeminy episode lasted 12 seconds, and longest trigeminy episode lasted 5 seconds.  Patient triggered events corresponded to sinus rhythm +/- PVCs   14 days of data recorded on Zio monitor. Patient had a min HR of 60 bpm, max HR of 171 bpm, and avg HR of 85 bpm. Predominant underlying rhythm was Sinus Rhythm. No VT, SVT, atrial fibrillation, high degree block, or pauses noted. Isolated atrial ectopy was rare (<1%).  Isolated ventricular ectopy was occasional (1.7%).   Longest bigeminy episode lasted 12 seconds, and longest trigeminy episode lasted 5 seconds.  There were 11 triggered events, corresponding to  sinus rhythm +/- PVCs. No significant arrhythmias detected.  Recent Labs: 11/13/2019: Magnesium 1.8 02/12/2020: TSH 1.350 04/02/2020: BUN 7; Creatinine, Ser 0.86; Hemoglobin 10.5; Platelets 475; Potassium 3.6; Sodium 140  Recent Lipid Panel    Component Value Date/Time   CHOL 160 11/13/2019 0908   TRIG 67 11/13/2019 0908   HDL 55 11/13/2019 0908   CHOLHDL 2.9 11/13/2019 0908   CHOLHDL 3.8 12/09/2014 1102   VLDL 25 12/09/2014 1102   LDLCALC 92 11/13/2019 0908    Physical Exam:    VS:  BP (!) 141/93 (BP Location: Left Arm, Patient Position: Sitting)   Pulse (!) 110   Ht 6' 2"  (1.88 m)   Wt (!) 451 lb 12.8 oz (204.9 kg)   SpO2 97%   BMI 58.01 kg/m     Wt Readings from Last 3 Encounters:  11/11/20 (!) 451 lb 12.8 oz (204.9 kg)  07/15/20 (!) 449 lb 9.6 oz (203.9 kg)  07/08/20 (!) 440 lb (199.6 kg)     GEN:   in no acute distress HEENT: Normal NECK: No JVD CARDIAC:tachycardic, regular, no murmurs, rubs, gallops RESPIRATORY:  Clear to auscultation without rales, wheezing or rhonchi  ABDOMEN: Soft, non-tender, non-distended MUSCULOSKELETAL:  No edema SKIN: Warm and dry NEUROLOGIC:  Alert and oriented x 3 PSYCHIATRIC:  Normal affect   ASSESSMENT:    1. PVC's (premature ventricular contractions)   2. Daytime somnolence   3. Hyperlipidemia, unspecified hyperlipidemia type   4. Morbid obesity with BMI of 50.0-59.9, adult ([place])   5. Essential hypertension    PLAN:    Palpitations: Describes episodes where it feels like heart is racing at rest, concerning for arrhythmia.  Zio patch x14 days showed no significant arrhythmias.  Did have occasional PVCs (1.7% of beats).  Will check BMP, magnesium to ensure no electrolyte abnormality causing PVCs -Frequent PVCs on EKG in clinic today.  Will check Zio patch x3 days to quantify PVC burden.  Will check electrolytes, TSH  Daytime somnolence: Check sleep study  Dyspnea on exertion: Suspect deconditioning is contributing.  No  structural heart disease on TTE  Chest pain: Description suggests noncardiac chest pain, as describes  dull aching pain not related to exertion.  No recent episodes  Hypertension: Currently on hydrochlorothiazide 25 mg daily and valsartan 40 mg daily.  BP appears elevated, will increase losartan to 80 mg daily.  Asked patient to check BP daily for next 2 weeks and call with results.  Will check BMP.  Hyperlipidemia:  LDL 92 on 11/13/2019, was on atorvastatin 20 mg daily at that time.  She is no longer on statin, she is unsure why.  Will check lipid panel  Prediabetes: On Metformin.  A1c 6.2 on 02/12/2020  Morbid obesity: Body mass index is 58.01 kg/m.  Refer to [facility] wellness  RTC in 6 months  Medication Adjustments/Labs and Tests Ordered: Current medicines are reviewed at length with the patient today.  Concerns regarding medicines are outlined above.  Orders Placed This Encounter  Procedures  . Comprehensive metabolic panel  . CBC  . Lipid panel  . TSH  . Ambulatory referral to [facility]  . LONG TERM MONITOR (3-14 DAYS)  . EKG 12-Lead  . Split night study   Meds ordered this encounter  Medications  . valsartan (DIOVAN) 80 MG tablet    Sig: Take 1 tablet (80 mg total) by mouth at bedtime.    Dispense:  90 tablet    Refill:  3    Dose increase    Patient Instructions  Medication Instructions:  INCREASE valsartan to 80 mg daily  Please check your blood pressure at home twice daily, write it down.  Call the office or send message via Mychart with the readings in 1-2 weeks for Dr. Gardiner Rhyme to review.   *If you need a refill on your cardiac medications before your next appointment, please call your pharmacy*   Lab Work: CMET, CBC, TSH, Lipid today  If you have labs (blood work) drawn today and your tests are completely normal, you will receive your results only by: Marland Kitchen MyChart Message (if you have MyChart) OR . A paper copy in the mail If you have any lab  test that is abnormal or we need to change your treatment, we will call you to review the results.   Testing/Procedures: Your physician has recommended that you have a sleep study. This test records several body functions during sleep, including: brain activity, eye movement, oxygen and carbon dioxide blood levels, heart rate and rhythm, breathing rate and rhythm, the flow of air through your mouth and nose, snoring, body muscle movements, and chest and belly movement.  ZIO XT- Long Term Monitor Instructions   Your physician has requested you wear a ZIO patch monitor for _3__ days.  This is a single patch monitor.   [employer] supplies one patch monitor per enrollment. Additional stickers are not available. Please do not apply patch if you will be having a Nuclear Stress Test, Echocardiogram, Cardiac CT, MRI, or Chest Xray during the period you would be wearing the monitor. The patch cannot be worn during these tests. You cannot remove and re-apply the ZIO XT patch monitor.  Your ZIO patch monitor will be sent Fed Ex from [employer] directly to your home address. It may take 3-5 days to receive your monitor after you have been enrolled.  Once you have received your monitor, please review the enclosed instructions. Your monitor has already been registered assigning a specific monitor serial # to you.  Billing and Patient Assistance Program Information   We have supplied [employer] with any of your insurance information on file for billing purposes.  [employer] offers a sliding scale Patient Assistance Program for patients that do not have insurance, or whose insurance does not completely cover the cost of the ZIO monitor.   You must apply for the Patient Assistance Program to qualify for this discounted rate.     To apply, please call [employer] at [phone], select option 4, then select option 2, and ask to apply for Patient Assistance Program.  Theodore Demark will ask your household income, and how many  people are in your household.  They will quote your out-of-pocket cost based on that information.  [employer] will also be able to set up a 8-month interest-free payment plan if needed.  Applying the monitor   Shave hair from upper left chest.  Hold abrader disc by orange tab. Rub abrader in 40 strokes over the upper left chest as indicated in your monitor instructions.  Clean area with 4 enclosed alcohol pads. Let dry.  Apply patch as indicated in monitor instructions. Patch will be placed under collarbone on left side of chest with arrow pointing upward.  Rub patch adhesive wings for 2 minutes. Remove white label marked "1". Remove the white label marked "2". Rub patch adhesive wings for 2 additional minutes.  While looking in a mirror, press and release button in center of patch. A small green light will flash 3-4 times. This will be your only indicator that the monitor has been turned on. ?  Do not shower for the first 24 hours. You may shower after the first 24 hours.  Press the button if you feel a symptom. You will hear a small click. Record Date, Time and Symptom in the Patient Logbook.  When you are ready to remove the patch, follow instructions on the last 2 pages of the Patient Logbook. Stick patch monitor onto the last page of Patient Logbook.  Place Patient Logbook in the blue and white box.  Use locking tab on box and tape box closed securely.  The blue and white box has prepaid postage on it. Please place it in the mailbox as soon as possible. Your physician should have your test results approximately 7 days [facility]  Call [employer] [phone] you have questions regarding your ZIO XT patch monitor. Call them immediately if you see an orange light blinking on your monitor.  If your monitor falls off in less than 4 days, contact our Monitor department at [phone] ?If your monitor becomes loose or falls off  after 4 days call [employer] at [phone] suggestions on securing your monitor.?  Follow-Up: [facility] and your health needs are our priority.  As part of our continuing mission to provide you with exceptional heart care, we have created designated Provider Care Teams.  These Care Teams include your primary Cardiologist (physician) and Advanced Practice Providers (APPs -  Physician Assistants and Nurse Practitioners) who all work together to provide you with the care you need, when you need it.  We recommend signing up for the patient portal called "MyChart".  Sign up information is provided on this After Visit Summary.  MyChart is used to connect with patients for Virtual Visits (Telemedicine).  Patients are able to view lab/test results, encounter notes, upcoming appointments, etc.  Non-urgent messages can be sent to your provider as well.   To learn more about what you can do with MyChart, go to [email or web address]    Your next appointment:   6 month(s)  The format for your next appointment:   In Person  Provider:   Oswaldo Milian, MD   Other Instructions You have been referred to [facility]  --they will contact you to get appointment scheduled       Ella Bodo as a scribe for Donato Heinz, MD.,have documented all relevant documentation on the behalf of Donato Heinz, MD,as directed by  Donato Heinz, MD while in the presence of Donato Heinz, MD.  I, Donato Heinz, MD, have reviewed all documentation for this visit. The documentation on 11/11/20 for the exam, diagnosis, procedures, and orders are all accurate and complete.   Signed, Donato Heinz, MD  11/11/2020 1:14 PM    [facility]

## 2020-11-12 LAB — COMPREHENSIVE METABOLIC PANEL
ALT: 18 IU/L (ref 0–32)
AST: 20 IU/L (ref 0–40)
Albumin/Globulin Ratio: 1.4 (ref 1.2–2.2)
Albumin: 4.4 g/dL (ref 3.8–4.8)
Alkaline Phosphatase: 83 IU/L (ref 44–121)
BUN/Creatinine Ratio: 12 (ref 9–23)
BUN: 10 mg/dL (ref 6–24)
Bilirubin Total: 0.4 mg/dL (ref 0.0–1.2)
CO2: 22 mmol/L (ref 20–29)
Calcium: 9.6 mg/dL (ref 8.7–10.2)
Chloride: 97 mmol/L (ref 96–106)
Creatinine, Ser: 0.83 mg/dL (ref 0.57–1.00)
Globulin, Total: 3.2 g/dL (ref 1.5–4.5)
Glucose: 139 mg/dL — ABNORMAL HIGH (ref 65–99)
Potassium: 4.7 mmol/L (ref 3.5–5.2)
Sodium: 139 mmol/L (ref 134–144)
Total Protein: 7.6 g/dL (ref 6.0–8.5)
eGFR: 90 mL/min/{1.73_m2} (ref >59)

## 2020-11-12 LAB — CBC
Hematocrit: 38.1 % (ref 34.0–46.6)
Hemoglobin: 11.4 g/dL (ref 11.1–15.9)
MCH: 23 pg — ABNORMAL LOW (ref 26.6–33.0)
MCHC: 29.9 g/dL — ABNORMAL LOW (ref 31.5–35.7)
MCV: 77 fL — ABNORMAL LOW (ref 79–97)
Platelets: 503 10*3/uL — ABNORMAL HIGH (ref 150–450)
RBC: 4.96 x10E6/uL (ref 3.77–5.28)
RDW: 13.9 % (ref 11.7–15.4)
WBC: 9.9 10*3/uL (ref 3.4–10.8)

## 2020-11-12 LAB — TSH: TSH: 1.33 u[IU]/mL (ref 0.450–4.500)

## 2020-11-12 LAB — LIPID PANEL
Chol/HDL Ratio: 3.2 ratio (ref 0.0–4.4)
Cholesterol, Total: 194 mg/dL (ref 100–199)
HDL: 60 mg/dL (ref >39)
LDL Chol Calc (NIH): 116 mg/dL — ABNORMAL HIGH (ref 0–99)
Triglycerides: 102 mg/dL (ref 0–149)
VLDL Cholesterol Cal: 18 mg/dL (ref 5–40)

## 2020-11-14 DIAGNOSIS — I493 Ventricular premature depolarization: Secondary | ICD-10-CM | POA: Diagnosis not present

## 2020-11-24 ENCOUNTER — Telehealth: Payer: Self-pay | Admitting: *Deleted

## 2020-11-24 NOTE — Telephone Encounter (Signed)
Patient notified of split night sleep study appointment details.

## 2020-11-24 NOTE — Telephone Encounter (Signed)
-----   Message from Harvel Ricks, RN sent at 11/11/2020  3:25 PM EDT ----- Regarding: split night Sleep study originally ordered 10/2019  Reordered, please precert and schedule.   Thanks!

## 2020-11-27 ENCOUNTER — Other Ambulatory Visit: Payer: Self-pay | Admitting: Family Medicine

## 2020-11-27 DIAGNOSIS — R7303 Prediabetes: Secondary | ICD-10-CM

## 2020-11-29 ENCOUNTER — Other Ambulatory Visit: Payer: Self-pay

## 2020-11-29 DIAGNOSIS — I10 Essential (primary) hypertension: Secondary | ICD-10-CM

## 2020-11-29 MED ORDER — HYDROCHLOROTHIAZIDE 25 MG PO TABS: 1.0000 | ORAL_TABLET | Freq: Every day | ORAL | 0 refills | Status: DC

## 2020-12-21 ENCOUNTER — Encounter: Payer: Self-pay | Admitting: *Deleted

## 2020-12-25 ENCOUNTER — Other Ambulatory Visit: Payer: Self-pay | Admitting: Family Medicine

## 2020-12-25 DIAGNOSIS — G5793 Unspecified mononeuropathy of bilateral lower limbs: Secondary | ICD-10-CM

## 2020-12-29 ENCOUNTER — Other Ambulatory Visit: Payer: Self-pay | Admitting: Family Medicine

## 2021-01-31 ENCOUNTER — Encounter (HOSPITAL_BASED_OUTPATIENT_CLINIC_OR_DEPARTMENT_OTHER): Payer: Medicaid Other | Admitting: Cardiovascular Disease

## 2021-02-01 ENCOUNTER — Encounter (INDEPENDENT_AMBULATORY_CARE_PROVIDER_SITE_OTHER): Payer: Self-pay | Admitting: Family Medicine

## 2021-02-01 ENCOUNTER — Ambulatory Visit (INDEPENDENT_AMBULATORY_CARE_PROVIDER_SITE_OTHER): Payer: Medicaid Other | Admitting: Family Medicine

## 2021-02-01 ENCOUNTER — Other Ambulatory Visit: Payer: Self-pay

## 2021-02-01 VITALS — BP 105/74 | HR 96 | Temp 98.0°F | Ht 73.0 in | Wt >= 6400 oz

## 2021-02-01 DIAGNOSIS — R5383 Other fatigue: Secondary | ICD-10-CM | POA: Diagnosis not present

## 2021-02-01 DIAGNOSIS — R0602 Shortness of breath: Secondary | ICD-10-CM

## 2021-02-01 DIAGNOSIS — E1169 Type 2 diabetes mellitus with other specified complication: Secondary | ICD-10-CM | POA: Diagnosis not present

## 2021-02-01 DIAGNOSIS — I10 Essential (primary) hypertension: Secondary | ICD-10-CM

## 2021-02-01 DIAGNOSIS — E7849 Other hyperlipidemia: Secondary | ICD-10-CM

## 2021-02-01 DIAGNOSIS — Z6841 Body Mass Index (BMI) 40.0 and over, adult: Secondary | ICD-10-CM

## 2021-02-01 DIAGNOSIS — Z1331 Encounter for screening for depression: Secondary | ICD-10-CM | POA: Diagnosis not present

## 2021-02-01 DIAGNOSIS — E559 Vitamin D deficiency, unspecified: Secondary | ICD-10-CM

## 2021-02-02 LAB — T3: T3, Total: 106 ng/dL (ref 71–180)

## 2021-02-02 LAB — COMPREHENSIVE METABOLIC PANEL
ALT: 14 IU/L (ref 0–32)
AST: 17 IU/L (ref 0–40)
Albumin/Globulin Ratio: 1.5 (ref 1.2–2.2)
Albumin: 4.6 g/dL (ref 3.8–4.8)
Alkaline Phosphatase: 71 IU/L (ref 44–121)
BUN/Creatinine Ratio: 16 (ref 9–23)
BUN: 13 mg/dL (ref 6–24)
Bilirubin Total: 0.4 mg/dL (ref 0.0–1.2)
CO2: 27 mmol/L (ref 20–29)
Calcium: 9.9 mg/dL (ref 8.7–10.2)
Chloride: 98 mmol/L (ref 96–106)
Creatinine, Ser: 0.83 mg/dL (ref 0.57–1.00)
Globulin, Total: 3 g/dL (ref 1.5–4.5)
Glucose: 118 mg/dL — ABNORMAL HIGH (ref 65–99)
Potassium: 4.8 mmol/L (ref 3.5–5.2)
Sodium: 138 mmol/L (ref 134–144)
Total Protein: 7.6 g/dL (ref 6.0–8.5)
eGFR: 90 mL/min/{1.73_m2} (ref >59)

## 2021-02-02 LAB — CBC WITH DIFFERENTIAL
Basophils Absolute: 0 10*3/uL (ref 0.0–0.2)
Basos: 1 %
EOS (ABSOLUTE): 0.1 10*3/uL (ref 0.0–0.4)
Eos: 1 %
Hematocrit: 36.3 % (ref 34.0–46.6)
Hemoglobin: 11.1 g/dL (ref 11.1–15.9)
Immature Grans (Abs): 0 10*3/uL (ref 0.0–0.1)
Immature Granulocytes: 0 %
Lymphocytes Absolute: 3.9 10*3/uL — ABNORMAL HIGH (ref 0.7–3.1)
Lymphs: 45 %
MCH: 23.4 pg — ABNORMAL LOW (ref 26.6–33.0)
MCHC: 30.6 g/dL — ABNORMAL LOW (ref 31.5–35.7)
MCV: 76 fL — ABNORMAL LOW (ref 79–97)
Monocytes Absolute: 0.5 10*3/uL (ref 0.1–0.9)
Monocytes: 6 %
Neutrophils Absolute: 4.1 10*3/uL (ref 1.4–7.0)
Neutrophils: 47 %
RBC: 4.75 x10E6/uL (ref 3.77–5.28)
RDW: 14.1 % (ref 11.7–15.4)
WBC: 8.6 10*3/uL (ref 3.4–10.8)

## 2021-02-02 LAB — LIPID PANEL WITH LDL/HDL RATIO
Cholesterol, Total: 178 mg/dL (ref 100–199)
HDL: 56 mg/dL (ref >39)
LDL Chol Calc (NIH): 107 mg/dL — ABNORMAL HIGH (ref 0–99)
LDL/HDL Ratio: 1.9 ratio (ref 0.0–3.2)
Triglycerides: 81 mg/dL (ref 0–149)
VLDL Cholesterol Cal: 15 mg/dL (ref 5–40)

## 2021-02-02 LAB — INSULIN, RANDOM: INSULIN: 22.7 u[IU]/mL (ref 2.6–24.9)

## 2021-02-02 LAB — T4, FREE: Free T4: 1.29 ng/dL (ref 0.82–1.77)

## 2021-02-02 LAB — TSH: TSH: 1.56 u[IU]/mL (ref 0.450–4.500)

## 2021-02-02 LAB — HEMOGLOBIN A1C
Est. average glucose Bld gHb Est-mCnc: 146 mg/dL
Hgb A1c MFr Bld: 6.7 % — ABNORMAL HIGH (ref 4.8–5.6)

## 2021-02-02 LAB — VITAMIN D 25 HYDROXY (VIT D DEFICIENCY, FRACTURES): Vit D, 25-Hydroxy: 13.9 ng/mL — ABNORMAL LOW (ref 30.0–100.0)

## 2021-02-02 NOTE — Progress Notes (Signed)
Chief Complaint:   OBESITY Karen Brennan (MR# [id number]) is a 45 y.o. female who presents for evaluation and treatment of obesity and related comorbidities. Current BMI is Body mass index is 58.45 kg/m. Karen Brennan has been struggling with her weight for many years and has been unsuccessful in either losing weight, maintaining weight loss, or reaching her healthy weight goal.  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 She went from 505 lbs to 420 lbs in 1 year, and she started regaining soon after. Her daughter is here and she is also working on weight loss.   Karen Brennan is currently in the action stage of change and ready to dedicate time achieving and maintaining a healthier weight. Karen Brennan is interested in becoming our patient and working on intensive lifestyle modifications including (but not limited to) diet and exercise for weight loss.  Karen Brennan's habits were reviewed today and are as follows: Her family eats meals together, she thinks her family will eat healthier with her, her desired weight loss is 223 lbs, she has been heavy most of her life, she started gaining weight after kids, her heaviest weight ever was 505 pounds, she has significant food cravings issues, she snacks frequently in the evenings, she skips meals frequently, she is frequently drinking liquids with calories, she frequently makes poor food choices, and she struggles with emotional eating.  Depression Screen Karen Brennan's Food and Mood (modified PHQ-9) score was 25.  Depression screen [facility] 2/9 02/01/2021  Decreased Interest 3  Down, Depressed, Hopeless 3  PHQ - 2 Score 6  Altered sleeping 3  Tired, decreased energy 3  Change in appetite 3  Feeling bad or failure about yourself  3  Trouble concentrating 3  Moving slowly or fidgety/restless 1  Suicidal thoughts 0  PHQ-9 Score 22  Difficult doing work/chores Extremely dIfficult  Some recent data might be hidden   Subjective:   1. Other fatigue Karen Brennan admits to daytime  somnolence and admits to waking up still tired. Patent has a history of symptoms of daytime fatigue. Karen Brennan generally gets 8 hours of sleep per night, and states that she has generally restful sleep. Snoring is not present. Apneic episodes are not present. Epworth Sleepiness Score is 5.  2. SOB (shortness of breath) Karen Brennan notes increasing shortness of breath with exercising and seems to be worsening over time with weight gain. She notes getting out of breath sooner with activity than she used to. This has not gotten worse recently. Karen Brennan denies shortness of breath at rest or orthopnea.  3. Type 2 diabetes mellitus with other specified complication, without long-term current use of insulin (HCC) Karen Brennan has been diagnosed with diabetes mellitus, but she had a better A1c recently. She is on metformin and she has had episodes of hypoglycemia in the past.  4. Primary hypertension Karen Brennan's blood pressure is well controlled, and she denies chest pain. She is working on diet and exercise.  5. Vitamin D deficiency Karen Brennan has a history of Vit D deficiency, and she notes fatigue.  6. Other hyperlipidemia Karen Brennan's recent LDL was above goal. She is not on a statin, and she is working on diet and exercise.  Assessment/Plan:   1. Other fatigue Karen Brennan does feel that her weight is causing her energy to be lower than it should be. Fatigue may be related to obesity, depression or many other causes. Labs will be ordered, and in the meanwhile, Karen Brennan will focus on self care including making healthy food choices, increasing  physical activity and focusing on stress reduction.  - Comprehensive metabolic panel - VITAMIN D 25 Hydroxy (Vit-D Deficiency, Fractures) - TSH - Lipid Panel With LDL/HDL Ratio - T3 - CBC With Differential - EKG 12-Lead - T4, free  2. SOB (shortness of breath) Karen Brennan does feel that she gets out of breath more easily that she used to when she exercises. Karen Brennan's shortness of breath appears to be  obesity related and exercise induced. She has agreed to work on weight loss and gradually increase exercise to treat her exercise induced shortness of breath. Will continue to monitor closely.  3. Type 2 diabetes mellitus with other specified complication, without long-term current use of insulin (HCC) We will check labs today. Karen Brennan will continue metformin, and start her Category 2 plan. Good blood sugar control is important to decrease the likelihood of diabetic complications such as nephropathy, neuropathy, limb loss, blindness, coronary artery disease, and death. Intensive lifestyle modification including diet, exercise and weight loss are the first line of treatment for diabetes.   - Insulin, random - Hemoglobin A1c  4. Primary hypertension Karen Brennan will start her Category 2 plan, and will continue to work on weight loss and exercise to improve blood pressure control. We will watch for signs of hypotension as she continues her lifestyle modifications.  5. Vitamin D deficiency Low Vitamin D level contributes to fatigue and are associated with obesity, breast, and colon cancer. We will check labs today. Karen Brennan will follow-up for routine testing of Vitamin D, at least 2-3 times per year to avoid over-replacement.  - VITAMIN D 25 Hydroxy (Vit-D Deficiency, Fractures)  6. Other hyperlipidemia Cardiovascular risk and specific lipid/LDL goals reviewed.  We discussed several lifestyle modifications today. We will check labs today. Karen Brennan will start her Category 2 plan, and will continue to work on diet, exercise and weight loss efforts. Orders and follow up as documented in patient record.   - Lipid Panel With LDL/HDL Ratio  7. Screening for depression Karen Brennan had a positive depression screening. Depression is commonly associated with obesity and often results in emotional eating behaviors. We will monitor this closely and work on CBT to help improve the non-hunger eating patterns. Referral to Psychology  may be required if no improvement is seen as she continues in our clinic.  8. Obesity with current BMI 58.5 Karen Brennan is currently in the action stage of change and her goal is to continue with weight loss efforts. I recommend Karen Brennan begin the structured treatment plan as follows:  She has agreed to the Category 2 Plan.   Behavioral modification strategies: increasing lean protein intake and dealing with family or coworker sabotage.  She was informed of the importance of frequent follow-up visits to maximize her success with intensive lifestyle modifications for her multiple health conditions. She was informed we would discuss her lab results at her next visit unless there is a critical issue that needs to be addressed sooner. Aylssa agreed to keep her next visit at the agreed upon time to discuss these results.  Objective:   Blood pressure 105/74, pulse 96, temperature 98 F (36.7 C), height 6\' 1"  (1.854 m), weight (!) 443 lb (200.9 kg), SpO2 99 %. Body mass index is 58.45 kg/m.  EKG: Normal sinus rhythm, rate 99 BPM.  Indirect Calorimeter completed today shows a VO2 of 170 and a REE of 1166.  Her calculated basal metabolic rate is thus her basal metabolic rate is worse than expected.  General: Cooperative, alert, well developed, in  no acute distress. HEENT: Conjunctivae and lids unremarkable. Cardiovascular: Regular rhythm.  Lungs: Normal work of breathing. Neurologic: No focal deficits.   Lab Results  Component Value Date   CREATININE 0.83 02/01/2021   BUN 13 02/01/2021   NA 138 02/01/2021   K 4.8 02/01/2021   CL 98 02/01/2021   CO2 27 02/01/2021   Lab Results  Component Value Date   ALT 14 02/01/2021   AST 17 02/01/2021   ALKPHOS 71 02/01/2021   BILITOT 0.4 02/01/2021   Lab Results  Component Value Date   HGBA1C 6.7 (H) 02/01/2021   HGBA1C 6.2 02/12/2020   HGBA1C 6.3 07/09/2019   HGBA1C 6.2 03/11/2018   HGBA1C 6.2 02/16/2017   Lab Results  Component Value Date    INSULIN 22.7 02/01/2021   Lab Results  Component Value Date   TSH 1.560 02/01/2021   Lab Results  Component Value Date   CHOL 178 02/01/2021   HDL 56 02/01/2021   LDLCALC 107 (H) 02/01/2021   TRIG 81 02/01/2021   CHOLHDL 3.2 11/11/2020   Lab Results  Component Value Date   WBC 8.6 02/01/2021   HGB 11.1 02/01/2021   HCT 36.3 02/01/2021   MCV 76 (L) 02/01/2021   PLT 503 (H) 11/11/2020   Lab Results  Component Value Date   FERRITIN 5 (L) 08/08/2008   Attestation Statements:   Reviewed by clinician on day of visit: allergies, medications, problem list, medical history, surgical history, family history, social history, and previous encounter notes.  Time spent on visit including pre-visit chart review and post-visit charting and care was 65 minutes.    I, Burt Knack, am acting as transcriptionist for Quillian Quince, MD.  I have reviewed the above documentation for accuracy and completeness, and I agree with the above. - Quillian Quince, MD

## 2021-02-15 ENCOUNTER — Other Ambulatory Visit: Payer: Self-pay

## 2021-02-15 ENCOUNTER — Encounter (INDEPENDENT_AMBULATORY_CARE_PROVIDER_SITE_OTHER): Payer: Self-pay | Admitting: Family Medicine

## 2021-02-15 ENCOUNTER — Ambulatory Visit (INDEPENDENT_AMBULATORY_CARE_PROVIDER_SITE_OTHER): Payer: Medicaid Other | Admitting: Family Medicine

## 2021-02-15 VITALS — BP 108/70 | HR 100 | Temp 99.1°F | Ht 73.0 in | Wt >= 6400 oz

## 2021-02-15 DIAGNOSIS — Z6841 Body Mass Index (BMI) 40.0 and over, adult: Secondary | ICD-10-CM

## 2021-02-15 DIAGNOSIS — E559 Vitamin D deficiency, unspecified: Secondary | ICD-10-CM

## 2021-02-15 DIAGNOSIS — E1169 Type 2 diabetes mellitus with other specified complication: Secondary | ICD-10-CM

## 2021-02-15 MED ORDER — METFORMIN HCL ER 500 MG PO TB24: ORAL_TABLET | ORAL | 0 refills | Status: DC

## 2021-02-15 MED ORDER — VITAMIN D (ERGOCALCIFEROL) 1.25 MG (50000 UNIT) PO CAPS
50000.0000 [IU] | ORAL_CAPSULE | ORAL | 0 refills | Status: DC
Start: 2021-02-15 — End: 2021-03-03

## 2021-02-16 NOTE — Progress Notes (Signed)
Chief Complaint:   OBESITY Karen Brennan is here to discuss her progress with her obesity treatment plan along with follow-up of her obesity related diagnoses. Karen Brennan is on the Category 2 Plan and states she is following her eating plan approximately 100% of the time. Karen Brennan states she is using a foot peddle 10 minutes 3-4 times per week.  Today's visit was #: 2 Starting weight: 443 lbs Starting date: 02/01/2021 Today's weight: 435 lbs Today's date: 02/15/2021 Total lbs lost to date: 8 Total lbs lost since last in-office visit: 8  Interim History: Karen Brennan has done very well with weight loss on her plan. Her hunger was controlled but she struggled with her breakfast options.  Subjective:   1. Vitamin D deficiency Karen Brennan's Vit D level is very low and she notes fatigue. I discussed labs with the patient today.  2. Type 2 diabetes mellitus with other specified complication, without long-term current use of insulin (HCC) Karen Brennan has a new diagnosis of diabetes mellitus II. She on metformin for pre-diabetes but, A1c now above 6.5. I discussed labs with the patient today.  Assessment/Plan:   1. Vitamin D deficiency Low Vitamin D level contributes to fatigue and are associated with obesity, breast, and colon cancer. Karen Brennan agreed to start prescription Vitamin D 50,000 IU every week with no refills. She will follow-up for routine testing of Vitamin D, at least 2-3 times per year to avoid over-replacement.  - Vitamin D, Ergocalciferol, (DRISDOL) 1.25 MG (50000 UNIT) CAPS capsule; Take 1 capsule (50,000 Units total) by mouth every 7 (seven) days.  Dispense: 4 capsule; Refill: 0  2. Type 2 diabetes mellitus with other specified complication, without long-term current use of insulin (HCC) Karen Brennan agreed to increase metformin XR to 2 tablets PO q daily with no refills. Good blood sugar control is important to decrease the likelihood of diabetic complications such as nephropathy, neuropathy, limb loss, blindness,  coronary artery disease, and death. Intensive lifestyle modification including diet, exercise and weight loss are the first line of treatment for diabetes.   - metFORMIN (GLUCOPHAGE-XR) 500 MG 24 hr tablet; TAKE 2 TABLET BY MOUTH PER DAY WITH BREAKFAST.  Dispense: 60 tablet; Refill: 0  3. Obesity with current BMI 57.4 Karen Brennan is currently in the action stage of change. As such, her goal is to continue with weight loss efforts. She has agreed to the Category 2 Plan with breakfast options.   Exercise goals: As is.  Behavioral modification strategies: increasing lean protein intake.  Karen Brennan has agreed to follow-up with our clinic in 2 weeks. She was informed of the importance of frequent follow-up visits to maximize her success with intensive lifestyle modifications for her multiple health conditions.   Objective:   Blood pressure 108/70, pulse 100, temperature 99.1 F (37.3 C), height 6\' 1"  (1.854 m), weight (!) 435 lb (197.3 kg), SpO2 96 %. Body mass index is 57.39 kg/m.  General: Cooperative, alert, well developed, in no acute distress. HEENT: Conjunctivae and lids unremarkable. Cardiovascular: Regular rhythm.  Lungs: Normal work of breathing. Neurologic: No focal deficits.   Lab Results  Component Value Date   CREATININE 0.83 02/01/2021   BUN 13 02/01/2021   NA 138 02/01/2021   K 4.8 02/01/2021   CL 98 02/01/2021   CO2 27 02/01/2021   Lab Results  Component Value Date   ALT 14 02/01/2021   AST 17 02/01/2021   ALKPHOS 71 02/01/2021   BILITOT 0.4 02/01/2021   Lab Results  Component Value Date  HGBA1C 6.7 (H) 02/01/2021   HGBA1C 6.2 02/12/2020   HGBA1C 6.3 07/09/2019   HGBA1C 6.2 03/11/2018   HGBA1C 6.2 02/16/2017   Lab Results  Component Value Date   INSULIN 22.7 02/01/2021   Lab Results  Component Value Date   TSH 1.560 02/01/2021   Lab Results  Component Value Date   CHOL 178 02/01/2021   HDL 56 02/01/2021   LDLCALC 107 (H) 02/01/2021   TRIG 81 02/01/2021    CHOLHDL 3.2 11/11/2020   Lab Results  Component Value Date   VD25OH 13.9 (L) 02/01/2021   Lab Results  Component Value Date   WBC 8.6 02/01/2021   HGB 11.1 02/01/2021   HCT 36.3 02/01/2021   MCV 76 (L) 02/01/2021   PLT 503 (H) 11/11/2020   Lab Results  Component Value Date   FERRITIN 5 (L) 08/08/2008   Attestation Statements:   Reviewed by clinician on day of visit: allergies, medications, problem list, medical history, surgical history, family history, social history, and previous encounter notes.   I, Burt Knack, am acting as transcriptionist for Quillian Quince, MD.  I have reviewed the above documentation for accuracy and completeness, and I agree with the above. -  Quillian Quince, MD

## 2021-02-23 ENCOUNTER — Other Ambulatory Visit: Payer: Self-pay | Admitting: Family Medicine

## 2021-03-03 ENCOUNTER — Ambulatory Visit (INDEPENDENT_AMBULATORY_CARE_PROVIDER_SITE_OTHER): Payer: Medicaid Other | Admitting: Family Medicine

## 2021-03-03 ENCOUNTER — Other Ambulatory Visit: Payer: Self-pay

## 2021-03-03 ENCOUNTER — Encounter (INDEPENDENT_AMBULATORY_CARE_PROVIDER_SITE_OTHER): Payer: Self-pay | Admitting: Family Medicine

## 2021-03-03 VITALS — BP 138/86 | HR 98 | Temp 98.3°F | Ht 73.0 in | Wt >= 6400 oz

## 2021-03-03 DIAGNOSIS — E559 Vitamin D deficiency, unspecified: Secondary | ICD-10-CM

## 2021-03-03 DIAGNOSIS — Z6841 Body Mass Index (BMI) 40.0 and over, adult: Secondary | ICD-10-CM

## 2021-03-03 MED ORDER — VITAMIN D (ERGOCALCIFEROL) 1.25 MG (50000 UNIT) PO CAPS: 50000.0000 [IU] | ORAL_CAPSULE | ORAL | 0 refills | Status: DC

## 2021-03-03 NOTE — Progress Notes (Signed)
Chief Complaint:   OBESITY Karen Brennan is here to discuss her progress with her obesity treatment plan along with follow-up of her obesity related diagnoses. [name] is on the Category 2 Plan and states she is following her eating plan approximately 60% of the time. [name] states she is using the foot pedals and hand weights for 10 minutes 3 times per week.  Today's visit was #: 3 Starting weight: 443 lbs Starting date: 02/01/2021 Today's weight: 438 lbs Today's date: 03/03/2021 Total lbs lost to date: 5 Total lbs lost since last in-office visit: 0  Interim History: Karen Brennan's mom endorses not caring for self and this is a challenge as she puts everything else ahead of herself. She is skipping meals. She has some financial burden, but per patient is not a concern.  Subjective:   1. Vitamin D deficiency Rafael is currently taking prescription vitamin D 50,000 IU each week. She denies nausea, vomiting or muscle weakness.  Assessment/Plan:  No orders of the defined types were placed in this encounter.   Medications Discontinued During This Encounter  Medication Reason   Vitamin D, Ergocalciferol, (DRISDOL) 1.25 MG (50000 UNIT) CAPS capsule Reorder     Meds ordered this encounter  Medications   Vitamin D, Ergocalciferol, (DRISDOL) 1.25 MG (50000 UNIT) CAPS capsule    Sig: Take 1 capsule (50,000 Units total) by mouth every 7 (seven) days.    Dispense:  4 capsule    Refill:  0    30 d supply;  ** OV for RF **   Do not send RF request     1. Vitamin D deficiency Low Vitamin D level contributes to fatigue and are associated with obesity, breast, and colon cancer. We will refill prescription Vitamin D 50,000 IU every week for 1 month. Karen Brennan will follow-up for routine testing of Vitamin D, at least 2-3 times per year to avoid over-replacement.  - Vitamin D, Ergocalciferol, (DRISDOL) 1.25 MG (50000 UNIT) CAPS capsule; Take 1 capsule (50,000 Units total) by mouth every 7 (seven) days.   Dispense: 4 capsule; Refill: 0  2. Obesity with current BMI of 57.8 Karen Brennan is currently in the action stage of change. As such, her goal is to continue with weight loss efforts. She has agreed to the Category 2 Plan.   Exercise goals: As is.  Behavioral modification strategies: increasing lean protein intake and no skipping meals.  [name] has agreed to follow-up with our clinic in 2 weeks. She was informed of the importance of frequent follow-up visits to maximize her success with intensive lifestyle modifications for her multiple health conditions.   Objective:   Blood pressure 138/86, pulse 98, temperature 98.3 F (36.8 C), height 6\' 1"  (1.854 m), weight (!) 438 lb (198.7 kg), SpO2 96 %. Body mass index is 57.79 kg/m.  General: Cooperative, alert, well developed, in no acute distress. HEENT: Conjunctivae and lids unremarkable. Cardiovascular: Regular rhythm.  Lungs: Normal work of breathing. Neurologic: No focal deficits.   Lab Results  Component Value Date   CREATININE 0.83 02/01/2021   BUN 13 02/01/2021   NA 138 02/01/2021   K 4.8 02/01/2021   CL 98 02/01/2021   CO2 27 02/01/2021   Lab Results  Component Value Date   ALT 14 02/01/2021   AST 17 02/01/2021   ALKPHOS 71 02/01/2021   BILITOT 0.4 02/01/2021   Lab Results  Component Value Date   HGBA1C 6.7 (H) 02/01/2021   HGBA1C 6.2 02/12/2020   HGBA1C 6.3 07/09/2019  HGBA1C 6.2 03/11/2018   HGBA1C 6.2 02/16/2017   Lab Results  Component Value Date   INSULIN 22.7 02/01/2021   Lab Results  Component Value Date   TSH 1.560 02/01/2021   Lab Results  Component Value Date   CHOL 178 02/01/2021   HDL 56 02/01/2021   LDLCALC 107 (H) 02/01/2021   TRIG 81 02/01/2021   CHOLHDL 3.2 11/11/2020   Lab Results  Component Value Date   VD25OH 13.9 (L) 02/01/2021   Lab Results  Component Value Date   WBC 8.6 02/01/2021   HGB 11.1 02/01/2021   HCT 36.3 02/01/2021   MCV 76 (L) 02/01/2021   PLT 503 (H) 11/11/2020    Lab Results  Component Value Date   FERRITIN 5 (L) 08/08/2008   Attestation Statements:   Reviewed by clinician on day of visit: allergies, medications, problem list, medical history, surgical history, family history, social history, and previous encounter notes.   Trude Mcburney, am acting as transcriptionist for [employer], DO.  I have reviewed the above documentation for accuracy and completeness, and I agree with the above. [name], D.O.  The 21st Century Cures Act was signed into law in 2016 which includes the topic of electronic health records.  This provides immediate access to information in MyChart.  This includes consultation notes, operative notes, office notes, lab results and pathology reports.  If you have any questions about what you read please let us know at your next visit so we can discuss your concerns and take corrective action if need be.  We are right here with you.

## 2021-03-08 ENCOUNTER — Other Ambulatory Visit (INDEPENDENT_AMBULATORY_CARE_PROVIDER_SITE_OTHER): Payer: Self-pay | Admitting: Family Medicine

## 2021-03-08 ENCOUNTER — Other Ambulatory Visit: Payer: Self-pay | Admitting: Family Medicine

## 2021-03-08 DIAGNOSIS — I10 Essential (primary) hypertension: Secondary | ICD-10-CM

## 2021-03-08 DIAGNOSIS — E1169 Type 2 diabetes mellitus with other specified complication: Secondary | ICD-10-CM

## 2021-03-08 NOTE — Telephone Encounter (Signed)
Pt called in and stated that her Victim D hasn't been filled the pt uses [facility] in [place]. Please advise

## 2021-03-08 NOTE — Telephone Encounter (Signed)
Last OV with Dr Opalski 

## 2021-03-09 ENCOUNTER — Encounter (INDEPENDENT_AMBULATORY_CARE_PROVIDER_SITE_OTHER): Payer: Self-pay

## 2021-03-10 ENCOUNTER — Other Ambulatory Visit: Payer: Self-pay

## 2021-03-10 MED ORDER — ACCU-CHEK GUIDE VI STRP: 1.0000 | ORAL_STRIP | 12 refills | Status: DC

## 2021-03-21 ENCOUNTER — Encounter (INDEPENDENT_AMBULATORY_CARE_PROVIDER_SITE_OTHER): Payer: Self-pay | Admitting: Bariatrics

## 2021-03-21 ENCOUNTER — Other Ambulatory Visit: Payer: Self-pay

## 2021-03-21 ENCOUNTER — Ambulatory Visit (INDEPENDENT_AMBULATORY_CARE_PROVIDER_SITE_OTHER): Payer: Medicaid Other | Admitting: Bariatrics

## 2021-03-21 VITALS — BP 115/75 | HR 107 | Temp 98.3°F | Ht 73.0 in | Wt >= 6400 oz

## 2021-03-21 DIAGNOSIS — E1169 Type 2 diabetes mellitus with other specified complication: Secondary | ICD-10-CM | POA: Diagnosis not present

## 2021-03-21 DIAGNOSIS — Z6841 Body Mass Index (BMI) 40.0 and over, adult: Secondary | ICD-10-CM | POA: Diagnosis not present

## 2021-03-21 DIAGNOSIS — E559 Vitamin D deficiency, unspecified: Secondary | ICD-10-CM

## 2021-03-21 MED ORDER — VITAMIN D (ERGOCALCIFEROL) 1.25 MG (50000 UNIT) PO CAPS: 50000.0000 [IU] | ORAL_CAPSULE | ORAL | 0 refills | Status: DC

## 2021-03-22 ENCOUNTER — Ambulatory Visit (HOSPITAL_BASED_OUTPATIENT_CLINIC_OR_DEPARTMENT_OTHER): Payer: Medicaid Other | Admitting: Cardiovascular Disease

## 2021-03-22 NOTE — Progress Notes (Signed)
Chief Complaint:   OBESITY Karen Brennan is here to discuss her progress with her obesity treatment plan along with follow-up of her obesity related diagnoses. Karen Brennan is on the Category 2 Plan and states she is following her eating plan approximately 80% of the time. Karen Brennan states she is doing weights for 30 minutes 4 times per week and pedal bike for 45 minutes 5 times per week.  Today's visit was #: 4 Starting weight: 443 lbs Starting date: 02/01/2021 Today's weight: 430 lbs Today's date: 03/21/2021 Total lbs lost to date: 13 lbs Total lbs lost since last in-office visit: 8 lbs  Interim History: Karen Brennan is down 8 lbs since her last visit and doing well.  Subjective:   1. Vitamin D deficiency Karen Brennan is taking her medications as directed.  2. Type 2 diabetes mellitus with other specified complication, without long-term current use of insulin (HCC) Karen Brennan is taking Metformin SR.  Assessment/Plan:   1. Vitamin D deficiency Low Vitamin D level contributes to fatigue and are associated with obesity, breast, and colon cancer. We will refill prescription Vitamin D 50,000 IU every week for 1 month with no refills and Ceara will follow-up for routine testing of Vitamin D, at least 2-3 times per year to avoid over-replacement.  - Vitamin D, Ergocalciferol, (DRISDOL) 1.25 MG (50000 UNIT) CAPS capsule; Take 1 capsule (50,000 Units total) by mouth every 7 (seven) days.  Dispense: 4 capsule; Refill: 0  2. Type 2 diabetes mellitus with other specified complication, without long-term current use of insulin (HCC) Karen Brennan will continue medications. Good blood sugar control is important to decrease the likelihood of diabetic complications such as nephropathy, neuropathy, limb loss, blindness, coronary artery disease, and death. Intensive lifestyle modification including diet, exercise and weight loss are the first line of treatment for diabetes.    3. Obesity with current BMI of 56.8 Karen Brennan is currently in  the action stage of change. As such, her goal is to continue with weight loss efforts. She has agreed to the Category 2 Plan.   Karen Brennan will continue meal planning. She will increase water and protein.  Exercise goals:  As is.  Behavioral modification strategies: increasing lean protein intake, decreasing simple carbohydrates, increasing vegetables, increasing water intake, decreasing eating out, no skipping meals, meal planning and cooking strategies, keeping healthy foods in the home, and planning for success.  Karen Brennan has agreed to follow-up with our clinic in 2 weeks with Dr. Sharee Holster or Dr. Dalbert Garnet or William Hamburger, NP. She was informed of the importance of frequent follow-up visits to maximize her success with intensive lifestyle modifications for her multiple health conditions.   Objective:   Blood pressure 115/75, pulse (!) 107, temperature 98.3 F (36.8 C), height 6\' 1"  (1.854 m), weight (!) 460 lb (208.7 kg), SpO2 98 %. Body mass index is 60.69 kg/m.  General: Cooperative, alert, well developed, in no acute distress. HEENT: Conjunctivae and lids unremarkable. Cardiovascular: Regular rhythm.  Lungs: Normal work of breathing. Neurologic: No focal deficits.   Lab Results  Component Value Date   CREATININE 0.83 02/01/2021   BUN 13 02/01/2021   NA 138 02/01/2021   K 4.8 02/01/2021   CL 98 02/01/2021   CO2 27 02/01/2021   Lab Results  Component Value Date   ALT 14 02/01/2021   AST 17 02/01/2021   ALKPHOS 71 02/01/2021   BILITOT 0.4 02/01/2021   Lab Results  Component Value Date   HGBA1C 6.7 (H) 02/01/2021   HGBA1C 6.2 02/12/2020  HGBA1C 6.3 07/09/2019   HGBA1C 6.2 03/11/2018   HGBA1C 6.2 02/16/2017   Lab Results  Component Value Date   INSULIN 22.7 02/01/2021   Lab Results  Component Value Date   TSH 1.560 02/01/2021   Lab Results  Component Value Date   CHOL 178 02/01/2021   HDL 56 02/01/2021   LDLCALC 107 (H) 02/01/2021   TRIG 81 02/01/2021   CHOLHDL 3.2  11/11/2020   Lab Results  Component Value Date   VD25OH 13.9 (L) 02/01/2021   Lab Results  Component Value Date   WBC 8.6 02/01/2021   HGB 11.1 02/01/2021   HCT 36.3 02/01/2021   MCV 76 (L) 02/01/2021   PLT 503 (H) 11/11/2020   Lab Results  Component Value Date   FERRITIN 5 (L) 08/08/2008   Attestation Statements:   Reviewed by clinician on day of visit: allergies, medications, problem list, medical history, surgical history, family history, social history, and previous encounter notes.  I, Jackson Latino, RMA, am acting as Energy manager for [employer], DO.   I have reviewed the above documentation for accuracy and completeness, and I agree with the above. Corinna Capra, DO

## 2021-03-23 ENCOUNTER — Encounter (INDEPENDENT_AMBULATORY_CARE_PROVIDER_SITE_OTHER): Payer: Self-pay | Admitting: Bariatrics

## 2021-04-06 ENCOUNTER — Encounter (INDEPENDENT_AMBULATORY_CARE_PROVIDER_SITE_OTHER): Payer: Self-pay | Admitting: Family Medicine

## 2021-04-06 ENCOUNTER — Ambulatory Visit (INDEPENDENT_AMBULATORY_CARE_PROVIDER_SITE_OTHER): Payer: Medicaid Other | Admitting: Family Medicine

## 2021-04-06 ENCOUNTER — Other Ambulatory Visit: Payer: Self-pay

## 2021-04-06 VITALS — BP 129/84 | HR 93 | Temp 98.5°F | Ht 73.0 in | Wt >= 6400 oz

## 2021-04-06 DIAGNOSIS — Z6841 Body Mass Index (BMI) 40.0 and over, adult: Secondary | ICD-10-CM

## 2021-04-06 DIAGNOSIS — E1169 Type 2 diabetes mellitus with other specified complication: Secondary | ICD-10-CM | POA: Diagnosis not present

## 2021-04-06 DIAGNOSIS — E559 Vitamin D deficiency, unspecified: Secondary | ICD-10-CM

## 2021-04-06 DIAGNOSIS — K5900 Constipation, unspecified: Secondary | ICD-10-CM

## 2021-04-06 MED ORDER — POLYETHYLENE GLYCOL 3350 17 GM/SCOOP PO POWD: 17.0000 g | Freq: Two times a day (BID) | ORAL | 0 refills | Status: DC | PRN

## 2021-04-06 MED ORDER — VITAMIN D (ERGOCALCIFEROL) 1.25 MG (50000 UNIT) PO CAPS: 50000.0000 [IU] | ORAL_CAPSULE | ORAL | 0 refills | Status: DC

## 2021-04-06 MED ORDER — METFORMIN HCL ER 500 MG PO TB24: ORAL_TABLET | ORAL | 0 refills | Status: DC

## 2021-04-06 MED ORDER — MAGNESIUM SALICYLATE 325 MG PO TABS: ORAL_TABLET | ORAL | 0 refills | Status: DC

## 2021-04-07 NOTE — Progress Notes (Signed)
Chief Complaint:   OBESITY Karen Brennan is here to discuss her progress with her obesity treatment plan along with follow-up of her obesity related diagnoses. Karen Brennan is on the Category 2 Plan and states she is following her eating plan approximately 90% of the time. Karen Brennan states she is using foot pedal and weights 40 minutes 4 times per week.  Today's visit was #: 5 Starting weight: 443 lbs Starting date: 02/01/2021 Today's weight: 422 lbs Today's date: 04/06/2021 Total lbs lost to date: 21 Total lbs lost since last in-office visit: 8  Interim History: Karen Brennan did a better job eating all foods on plan than the time I saw her. She denies hunger or cravings. Pt has no issues or concerns. She feels great and has started exercising.  Subjective:   1. Constipation, unspecified constipation type Karen Brennan reports constipation for a week or so. She is only drinking 3-4 bottles of water per day.  2. Vitamin D deficiency She is currently taking prescription vitamin D 50,000 IU each week. She denies nausea, vomiting or muscle weakness.  3. Type 2 diabetes mellitus with other specified complication, without long-term current use of insulin (HCC) Karen Brennan's lowest fasting blood sugar was 116 and they have not been over 120. Pt is doing great.  Assessment/Plan:  No orders of the defined types were placed in this encounter.   Medications Discontinued During This Encounter  Medication Reason   metFORMIN (GLUCOPHAGE-XR) 500 MG 24 hr tablet Reorder   Vitamin D, Ergocalciferol, (DRISDOL) 1.25 MG (50000 UNIT) CAPS capsule Reorder     Meds ordered this encounter  Medications   metFORMIN (GLUCOPHAGE-XR) 500 MG 24 hr tablet    Sig: TAKE 2 TABLET BY MOUTH PER DAY WITH BREAKFAST.    Dispense:  60 tablet    Refill:  0    30 d supply;  ** OV for RF **   Do not send RF request   Vitamin D, Ergocalciferol, (DRISDOL) 1.25 MG (50000 UNIT) CAPS capsule    Sig: Take 1 capsule (50,000 Units total) by mouth every 7  (seven) days.    Dispense:  4 capsule    Refill:  0    30 d supply;  ** OV for RF **   Do not send RF request   Magnesium Salicylate 325 MG TABS    Sig: 1 po q evening before bed or "CALM" supplement nightly    Dispense:  90 tablet    Refill:  0   polyethylene glycol powder (GLYCOLAX/MIRALAX) 17 GM/SCOOP powder    Sig: Take 17 g by mouth 2 (two) times daily as needed. Then daily as needed constipation    Dispense:  116 g    Refill:  0    30 d supply;  ** OV for RF **   Do not send RF request     1. Constipation, unspecified constipation type Jianna was informed that a decrease in bowel movement frequency is normal while losing weight, but stools should not be hard or painful. Orders and follow up as documented in patient record. Start Miralax as directed. Start Magnesium 325 mg as directed. Increase water intake- try to get 80-100 oz per day.  Counseling Getting to Good Bowel Health: Your goal is to have one soft bowel movement each day. Drink at least 8 glasses of water each day. Eat plenty of fiber (goal is over 25 grams each day). It is best to get most of your fiber from dietary sources which includes leafy  green vegetables, fresh fruit, and whole grains. You may need to add fiber with the help of OTC fiber supplements. These include Metamucil, Citrucel, and Flaxseed. If you are still having trouble, try adding Miralax or Magnesium Citrate. If all of these changes do not work, Dietitian.  Start- Magnesium Salicylate 325 MG TABS; 1 po q evening before bed or "CALM" supplement nightly  Dispense: 90 tablet; Refill: 0 Start- polyethylene glycol powder (GLYCOLAX/MIRALAX) 17 GM/SCOOP powder; Take 17 g by mouth 2 (two) times daily as needed. Then daily as needed constipation  Dispense: 116 g; Refill: 0  2. Vitamin D deficiency Low Vitamin D level contributes to fatigue and are associated with obesity, breast, and colon cancer. She agrees to continue to take prescription Vitamin D  50,000 IU every week and will follow-up for routine testing of Vitamin D, at least 2-3 times per year to avoid over-replacement.  Refill- Vitamin D, Ergocalciferol, (DRISDOL) 1.25 MG (50000 UNIT) CAPS capsule; Take 1 capsule (50,000 Units total) by mouth every 7 (seven) days.  Dispense: 4 capsule; Refill: 0  3. Type 2 diabetes mellitus with other specified complication, without long-term current use of insulin (HCC) Good blood sugar control is important to decrease the likelihood of diabetic complications such as nephropathy, neuropathy, limb loss, blindness, coronary artery disease, and death. Intensive lifestyle modification including diet, exercise and weight loss are the first line of treatment for diabetes. Continue prudent nutritional plan and decrease simple carbs.  Refill- metFORMIN (GLUCOPHAGE-XR) 500 MG 24 hr tablet; TAKE 2 TABLET BY MOUTH PER DAY WITH BREAKFAST.  Dispense: 60 tablet; Refill: 0  4. Obesity with current BMI of 55.7  Karen Brennan is currently in the action stage of change. As such, her goal is to continue with weight loss efforts. She has agreed to the Category 2 Plan.   Exercise goals:  As is  Behavioral modification strategies: meal planning and cooking strategies.  Karen Brennan has agreed to follow-up with our clinic in 2-3 weeks. She was informed of the importance of frequent follow-up visits to maximize her success with intensive lifestyle modifications for her multiple health conditions.   Objective:   Blood pressure 129/84, pulse 93, temperature 98.5 F (36.9 C), height 6\' 1"  (1.854 m), weight (!) 422 lb (191.4 kg), SpO2 98 %. Body mass index is 55.68 kg/m.  General: Cooperative, alert, well developed, in no acute distress. HEENT: Conjunctivae and lids unremarkable. Cardiovascular: Regular rhythm.  Lungs: Normal work of breathing. Neurologic: No focal deficits.   Lab Results  Component Value Date   CREATININE 0.83 02/01/2021   BUN 13 02/01/2021   NA 138  02/01/2021   K 4.8 02/01/2021   CL 98 02/01/2021   CO2 27 02/01/2021   Lab Results  Component Value Date   ALT 14 02/01/2021   AST 17 02/01/2021   ALKPHOS 71 02/01/2021   BILITOT 0.4 02/01/2021   Lab Results  Component Value Date   HGBA1C 6.7 (H) 02/01/2021   HGBA1C 6.2 02/12/2020   HGBA1C 6.3 07/09/2019   HGBA1C 6.2 03/11/2018   HGBA1C 6.2 02/16/2017   Lab Results  Component Value Date   INSULIN 22.7 02/01/2021   Lab Results  Component Value Date   TSH 1.560 02/01/2021   Lab Results  Component Value Date   CHOL 178 02/01/2021   HDL 56 02/01/2021   LDLCALC 107 (H) 02/01/2021   TRIG 81 02/01/2021   CHOLHDL 3.2 11/11/2020   Lab Results  Component Value Date   VD25OH 13.9 (  L) 02/01/2021   Lab Results  Component Value Date   WBC 8.6 02/01/2021   HGB 11.1 02/01/2021   HCT 36.3 02/01/2021   MCV 76 (L) 02/01/2021   PLT 503 (H) 11/11/2020   Lab Results  Component Value Date   FERRITIN 5 (L) 08/08/2008    Attestation Statements:   Reviewed by clinician on day of visit: allergies, medications, problem list, medical history, surgical history, family history, social history, and previous encounter notes.  Edmund Hilda, CMA, am acting as transcriptionist for [employer], DO.  I have reviewed the above documentation for accuracy and completeness, and I agree with the above. [name], D.O.  The 21st Century Cures Act was signed into law in 2016 which includes the topic of electronic health records.  This provides immediate access to information in MyChart.  This includes consultation notes, operative notes, office notes, lab results and pathology reports.  If you have any questions about what you read please let us know at your next visit so we can discuss your concerns and take corrective action if need be.  We are right here with you.

## 2021-04-08 ENCOUNTER — Other Ambulatory Visit: Payer: Self-pay | Admitting: Family Medicine

## 2021-04-08 DIAGNOSIS — E1169 Type 2 diabetes mellitus with other specified complication: Secondary | ICD-10-CM

## 2021-04-14 ENCOUNTER — Ambulatory Visit (INDEPENDENT_AMBULATORY_CARE_PROVIDER_SITE_OTHER): Payer: Medicaid Other

## 2021-04-14 ENCOUNTER — Other Ambulatory Visit: Payer: Self-pay

## 2021-04-14 ENCOUNTER — Ambulatory Visit (INDEPENDENT_AMBULATORY_CARE_PROVIDER_SITE_OTHER): Payer: Medicaid Other | Admitting: Family Medicine

## 2021-04-14 VITALS — BP 126/87 | HR 98 | Ht 73.0 in | Wt >= 6400 oz

## 2021-04-14 DIAGNOSIS — R102 Pelvic and perineal pain: Secondary | ICD-10-CM | POA: Diagnosis present

## 2021-04-14 DIAGNOSIS — Z23 Encounter for immunization: Secondary | ICD-10-CM

## 2021-04-14 NOTE — Progress Notes (Signed)
   SUBJECTIVE:   CHIEF COMPLAINT / HPI:    Karen Brennan is a 45 y.o. female here for left buttock pain the past week.  Pain feels like it is inside her rectum.  States she feels it on her right side when she sits down.  Pain described as aching.  She did have constipation but that has resolved with medication.  Using Tylenol for pain.  She does not feel a mass on her right buttock.  Denies fever, discharge, vomiting, rectal bleeding or painful defecation.  Does endorse occasional nausea.  Gets a period maybe once a year during ablation.  History of tubal ligation.  No previous similar symptoms.  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  Etiology of pain unclear.  Patient declined digital rectal exam.  Concern for pilonidal abscess vs early fistula given intergluteal cleft splint.  ED and return precautions given.  Continue over-the-counter pain medication as needed.  Obtain CT pelvis with contrast.     56, DO PGY-3, [facility] 04/14/2021

## 2021-04-14 NOTE — Assessment & Plan Note (Addendum)
Patient is a 45 year old female with history of 1 week pelvic pain.  Etiology of pain unclear.  Patient declined digital rectal exam.  Concern for pilonidal abscess vs early fistula given intergluteal cleft splint.  ED and return precautions given.  Continue over-the-counter pain medication as needed.  Obtain CT pelvis with contrast.

## 2021-04-14 NOTE — Patient Instructions (Signed)
It was great seeing you today!   Visit Remembers: - Continue taking over the counter pain medication as needed  - You were scheduled for a CT Pelvis, be sure to arrive 15 minutes early to your appointment.   Feel free to call with any questions or concerns at any time, at [phone].   Take care,  Dr. Katherina [facility]

## 2021-04-20 ENCOUNTER — Telehealth (INDEPENDENT_AMBULATORY_CARE_PROVIDER_SITE_OTHER): Payer: Medicaid Other | Admitting: Bariatrics

## 2021-04-20 ENCOUNTER — Encounter (INDEPENDENT_AMBULATORY_CARE_PROVIDER_SITE_OTHER): Payer: Self-pay | Admitting: Bariatrics

## 2021-04-20 DIAGNOSIS — Z6841 Body Mass Index (BMI) 40.0 and over, adult: Secondary | ICD-10-CM

## 2021-04-20 DIAGNOSIS — E559 Vitamin D deficiency, unspecified: Secondary | ICD-10-CM

## 2021-04-20 DIAGNOSIS — F5089 Other specified eating disorder: Secondary | ICD-10-CM

## 2021-04-20 DIAGNOSIS — E1169 Type 2 diabetes mellitus with other specified complication: Secondary | ICD-10-CM

## 2021-04-20 MED ORDER — BUPROPION HCL ER (SR) 100 MG PO TB12: 100.0000 mg | ORAL_TABLET | Freq: Every day | ORAL | 0 refills | Status: DC

## 2021-04-20 MED ORDER — VITAMIN D (ERGOCALCIFEROL) 1.25 MG (50000 UNIT) PO CAPS
50000.0000 [IU] | ORAL_CAPSULE | ORAL | 0 refills | Status: DC
Start: 2021-04-20 — End: 2021-05-10

## 2021-04-21 NOTE — Progress Notes (Signed)
TeleHealth Visit:  Due to the COVID-19 pandemic, this visit was completed with telemedicine (audio/video) technology to reduce patient and provider exposure as well as to preserve personal protective equipment.   Karen Brennan has verbally consented to this TeleHealth visit. The patient is located at home, the provider is located at the [facility] office. The participants in this visit include the listed provider and patient. The visit was conducted today via video.  Chief Complaint: OBESITY Karen Brennan is here to discuss her progress with her obesity treatment plan along with follow-up of her obesity related diagnoses. Karen Brennan is on the Category 2 Plan and states she is following her eating plan approximately 80% of the time. Karen Brennan states she is using a stationary bike for 30 minutes 3 times per week and weights for 10 minutes 4 times per week.  Today's visit was #: 6 Starting weight: 443 lbs Starting date: 02/01/2021  Interim History: Karen Brennan states that she is sick and requested MyChart visit. She is about the same. She is trying to drink enough water.   Subjective:   1. Vitamin D deficiency Karen Brennan is taking Vitamin D currently.   2. Type 2 diabetes mellitus with other specified complication, without long-term current use of insulin (HCC) Karen Brennan is currently taking Metformin.   3. Other disorder of eating Karen Brennan notes craving that she feels may be sabotaging Her She denies allergies or contraindications to Wellbutrin   Assessment/Plan:   1. Vitamin D deficiency Low Vitamin D level contributes to fatigue and are associated with obesity, breast, and colon cancer. We will refill prescription Vitamin D 50,000 IU every week for 1 month with no refills and Karen Brennan will follow-up for routine testing of Vitamin D, at least 2-3 times per year to avoid over-replacement.  - Vitamin D, Ergocalciferol, (DRISDOL) 1.25 MG (50000 UNIT) CAPS capsule; Take 1 capsule (50,000 Units total) by mouth every  7 (seven) days.  Dispense: 4 capsule; Refill: 0  2. Type 2 diabetes mellitus with other specified complication, without long-term current use of insulin (HCC) Karen Brennan will continue her medications. Good blood sugar control is important to decrease the likelihood of diabetic complications such as nephropathy, neuropathy, limb loss, blindness, coronary artery disease, and death. Intensive lifestyle modification including diet, exercise and weight loss are the first line of treatment for diabetes.   3. Other disorder of eating Behavior modification techniques were discussed today to help Karen Brennan deal with her emotional/non-hunger eating behaviors.  We will refill Wellbutrin 100 mg daily for 1 month with no refills. Orders and follow up as documented in patient record.    - buPROPion ER (WELLBUTRIN SR) 100 MG 12 hr tablet; Take 1 tablet (100 mg total) by mouth daily.  Dispense: 30 tablet; Refill: 0  4. Obesity with current BMI of 55.7 Karen Brennan is currently in the action stage of change. As such, her goal is to continue with weight loss efforts. She has agreed to the Category 2 Plan.   Karen Brennan will continue meal planning and intentional eating. She will increase protein.   Exercise goals:  As is.  Behavioral modification strategies: increasing lean protein intake, decreasing simple carbohydrates, increasing vegetables, increasing water intake, decreasing eating out, no skipping meals, meal planning and cooking strategies, keeping healthy foods in the home, and planning for success.  Karen Brennan has agreed to follow-up with our clinic in 2-3 weeks. She was informed of the importance of frequent follow-up visits to maximize her success with intensive lifestyle modifications for her multiple  health conditions.  Objective:   VITALS: Per patient if applicable, see vitals. GENERAL: Alert and in no acute distress. CARDIOPULMONARY: No increased WOB. Speaking in clear sentences.  PSYCH: Pleasant and cooperative. Speech  normal rate and rhythm. Affect is appropriate. Insight and judgement are appropriate. Attention is focused, linear, and appropriate.  NEURO: Oriented as arrived to appointment on time with no prompting.   Lab Results  Component Value Date   CREATININE 0.83 02/01/2021   BUN 13 02/01/2021   NA 138 02/01/2021   K 4.8 02/01/2021   CL 98 02/01/2021   CO2 27 02/01/2021   Lab Results  Component Value Date   ALT 14 02/01/2021   AST 17 02/01/2021   ALKPHOS 71 02/01/2021   BILITOT 0.4 02/01/2021   Lab Results  Component Value Date   HGBA1C 6.7 (H) 02/01/2021   HGBA1C 6.2 02/12/2020   HGBA1C 6.3 07/09/2019   HGBA1C 6.2 03/11/2018   HGBA1C 6.2 02/16/2017   Lab Results  Component Value Date   INSULIN 22.7 02/01/2021   Lab Results  Component Value Date   TSH 1.560 02/01/2021   Lab Results  Component Value Date   CHOL 178 02/01/2021   HDL 56 02/01/2021   LDLCALC 107 (H) 02/01/2021   TRIG 81 02/01/2021   CHOLHDL 3.2 11/11/2020   Lab Results  Component Value Date   VD25OH 13.9 (L) 02/01/2021   Lab Results  Component Value Date   WBC 8.6 02/01/2021   HGB 11.1 02/01/2021   HCT 36.3 02/01/2021   MCV 76 (L) 02/01/2021   PLT 503 (H) 11/11/2020   Lab Results  Component Value Date   FERRITIN 5 (L) 08/08/2008    Attestation Statements:   Reviewed by clinician on day of visit: allergies, medications, problem list, medical history, surgical history, family history, social history, and previous encounter notes.  I, Jackson Latino, RMA, am acting as Energy manager for [employer], DO.   I have reviewed the above documentation for accuracy and completeness, and I agree with the above. Corinna Capra, DO

## 2021-04-22 ENCOUNTER — Encounter (INDEPENDENT_AMBULATORY_CARE_PROVIDER_SITE_OTHER): Payer: Self-pay | Admitting: Bariatrics

## 2021-05-02 ENCOUNTER — Ambulatory Visit
Admission: RE | Admit: 2021-05-02 | Discharge: 2021-05-02 | Disposition: A | Discharge status: Home / Self Care | Payer: Medicaid Other | Source: Ambulatory Visit | Attending: Family Medicine | Admitting: Family Medicine

## 2021-05-02 DIAGNOSIS — R102 Pelvic and perineal pain: Secondary | ICD-10-CM

## 2021-05-02 MED ORDER — IOPAMIDOL (ISOVUE-300) INJECTION 61%
120.0000 mL | Freq: Once | INTRAVENOUS | Status: AC | PRN
  Administered 2021-05-02: 120 mL via INTRAVENOUS

## 2021-05-04 ENCOUNTER — Telehealth: Payer: Self-pay

## 2021-05-04 NOTE — Telephone Encounter (Signed)
Patient calls nurse line requesting recent imaging results.   Will forward to provider who saw patient.

## 2021-05-09 DIAGNOSIS — F250 Schizoaffective disorder, bipolar type: Secondary | ICD-10-CM | POA: Diagnosis not present

## 2021-05-09 DIAGNOSIS — F5101 Primary insomnia: Secondary | ICD-10-CM | POA: Diagnosis not present

## 2021-05-09 DIAGNOSIS — F411 Generalized anxiety disorder: Secondary | ICD-10-CM | POA: Diagnosis not present

## 2021-05-10 ENCOUNTER — Telehealth (INDEPENDENT_AMBULATORY_CARE_PROVIDER_SITE_OTHER): Payer: Medicaid Other | Admitting: Family Medicine

## 2021-05-10 ENCOUNTER — Encounter (INDEPENDENT_AMBULATORY_CARE_PROVIDER_SITE_OTHER): Payer: Self-pay | Admitting: Family Medicine

## 2021-05-10 ENCOUNTER — Other Ambulatory Visit: Payer: Self-pay

## 2021-05-10 DIAGNOSIS — E559 Vitamin D deficiency, unspecified: Secondary | ICD-10-CM

## 2021-05-10 DIAGNOSIS — K5900 Constipation, unspecified: Secondary | ICD-10-CM | POA: Diagnosis not present

## 2021-05-10 DIAGNOSIS — F411 Generalized anxiety disorder: Secondary | ICD-10-CM | POA: Diagnosis not present

## 2021-05-10 DIAGNOSIS — F39 Unspecified mood [affective] disorder: Secondary | ICD-10-CM | POA: Diagnosis not present

## 2021-05-10 DIAGNOSIS — Z6841 Body Mass Index (BMI) 40.0 and over, adult: Secondary | ICD-10-CM | POA: Diagnosis not present

## 2021-05-10 DIAGNOSIS — F5101 Primary insomnia: Secondary | ICD-10-CM | POA: Diagnosis not present

## 2021-05-10 DIAGNOSIS — F250 Schizoaffective disorder, bipolar type: Secondary | ICD-10-CM | POA: Diagnosis not present

## 2021-05-10 MED ORDER — VITAMIN D (ERGOCALCIFEROL) 1.25 MG (50000 UNIT) PO CAPS: 50000.0000 [IU] | ORAL_CAPSULE | ORAL | 0 refills | Status: DC

## 2021-05-10 MED ORDER — POLYETHYLENE GLYCOL 3350 17 GM/SCOOP PO POWD: 17.0000 g | Freq: Two times a day (BID) | ORAL | 0 refills | Status: DC | PRN

## 2021-05-10 MED ORDER — BUPROPION HCL ER (SR) 100 MG PO TB12: 100.0000 mg | ORAL_TABLET | Freq: Every day | ORAL | 0 refills | Status: DC

## 2021-05-10 NOTE — Progress Notes (Signed)
TeleHealth Visit:  Due to the COVID-19 pandemic, this visit was completed with telemedicine (audio/video) technology to reduce patient and provider exposure as well as to preserve personal protective equipment.   Karen Brennan has verbally consented to this TeleHealth visit. The patient is located at home, the provider is located at the [facility] [facility] office. The participants in this visit include the listed provider and patient. The visit was conducted today via video.   Chief Complaint: OBESITY Karen Brennan is here to discuss her progress with her obesity treatment plan along with follow-up of her obesity related diagnoses. Karen Brennan is on the Category 2 Plan and states she is following her eating plan approximately 80% of the time. Karen Brennan states she is weight training and foot pedals 10-30 minutes 4 times per week.  Today's visit was #: 7 Starting weight: 443 lbs Starting date: 02/01/2021  Interim History: Karen Brennan has a headache, body aches, fever/chills of 102.0 orally, and sinus congestion. Home COVID test is negative and pt has appt with PCP tomorrow. The past 2 weeks, she has not exercised and has been unable to afford foods that she needs on plan. Pt endorses eating "whatever is in the house". She is struggling emotionally with current illness, as well.  Subjective:   1. Mood disorder (HCC) with emotional eating Fran foes to [facility] every 3-4 week for medication management. She's had increased stress lately. Pt is on Haldol, Prozac, etc. Pt endorses Wellbutrin is helping with emotional eating and was okayed by psych.  2. Constipation, unspecified constipation type Antavia only has 4-5 bottles of water a day. She is still constipated but Miralax works well. Pt requests a refill and is using it daily.  3. Vitamin D deficiency She is currently taking prescription vitamin D 50,000 IU each week. She denies nausea, vomiting or muscle weakness.  Assessment/Plan:  No orders of the  defined types were placed in this encounter.   Medications Discontinued During This Encounter  Medication Reason   polyethylene glycol powder (GLYCOLAX/MIRALAX) 17 GM/SCOOP powder Reorder   Vitamin D, Ergocalciferol, (DRISDOL) 1.25 MG (50000 UNIT) CAPS capsule Reorder   buPROPion ER (WELLBUTRIN SR) 100 MG 12 hr tablet Reorder     Meds ordered this encounter  Medications   buPROPion ER (WELLBUTRIN SR) 100 MG 12 hr tablet    Sig: Take 1 tablet (100 mg total) by mouth daily.    Dispense:  30 tablet    Refill:  0   polyethylene glycol powder (GLYCOLAX/MIRALAX) 17 GM/SCOOP powder    Sig: Take 17 g by mouth 2 (two) times daily as needed. Then daily as needed constipation    Dispense:  116 g    Refill:  0    30 d supply;  ** OV for RF **   Do not send RF request   Vitamin D, Ergocalciferol, (DRISDOL) 1.25 MG (50000 UNIT) CAPS capsule    Sig: Take 1 capsule (50,000 Units total) by mouth every 7 (seven) days.    Dispense:  4 capsule    Refill:  0    30 d supply;  ** OV for RF **   Do not send RF request     1. Mood disorder (HCC) with emotional eating Continue Wellbutrin at current dose and at upcoming OV with psychiatrist, pt will ask about what medication changes may be appropriate.  Refill- buPROPion ER (WELLBUTRIN SR) 100 MG 12 hr tablet; Take 1 tablet (100 mg total) by mouth daily.  Dispense: 30 tablet;  Refill: 0  2. Constipation, unspecified constipation type Increase water intake. Counseling done. Mary-Ann was informed that a decrease in bowel movement frequency is normal while losing weight, but stools should not be hard or painful. Orders and follow up as documented in patient record.   Counseling Getting to Good Bowel Health: Your goal is to have one soft bowel movement each day. Drink at least 8 glasses of water each day. Eat plenty of fiber (goal is over 25 grams each day). It is best to get most of your fiber from dietary sources which includes leafy green vegetables, fresh  fruit, and whole grains. You may need to add fiber with the help of OTC fiber supplements. These include Metamucil, Citrucel, and Flaxseed. If you are still having trouble, try adding Miralax or Magnesium Citrate. If all of these changes do not work, Dietitian.  Refill- polyethylene glycol powder (GLYCOLAX/MIRALAX) 17 GM/SCOOP powder; Take 17 g by mouth 2 (two) times daily as needed. Then daily as needed constipation  Dispense: 116 g; Refill: 0  3. Vitamin D deficiency Low Vitamin D level contributes to fatigue and are associated with obesity, breast, and colon cancer. She agrees to continue to take prescription Vitamin D 50,000 IU every week and will follow-up for routine testing of Vitamin D, at least 2-3 times per year to avoid over-replacement.  Refill- Vitamin D, Ergocalciferol, (DRISDOL) 1.25 MG (50000 UNIT) CAPS capsule; Take 1 capsule (50,000 Units total) by mouth every 7 (seven) days.  Dispense: 4 capsule; Refill: 0  4. Obesity with current BMI of 55.68  Jahmiyah is currently in the action stage of change. As such, her goal is to continue with weight loss efforts. She has agreed to the Category 2 Plan.   Exercise goals: All adults should avoid inactivity. Some physical activity is better than none, and adults who participate in any amount of physical activity gain some health benefits.  Behavioral modification strategies: keeping healthy foods in the home and planning for success.  Karen Brennan has agreed to follow-up with our clinic in 2-3 weeks. She was informed of the importance of frequent follow-up visits to maximize her success with intensive lifestyle modifications for her multiple health conditions.  Objective:   VITALS: Per patient if applicable, see vitals. GENERAL: Alert and in no acute distress. CARDIOPULMONARY: No increased WOB. Speaking in clear sentences.  PSYCH: Pleasant and cooperative. Speech normal rate and rhythm. Affect is appropriate. Insight and judgement  are appropriate. Attention is focused, linear, and appropriate.  NEURO: Oriented as arrived to appointment on time with no prompting.   Lab Results  Component Value Date   CREATININE 0.83 02/01/2021   BUN 13 02/01/2021   NA 138 02/01/2021   K 4.8 02/01/2021   CL 98 02/01/2021   CO2 27 02/01/2021   Lab Results  Component Value Date   ALT 14 02/01/2021   AST 17 02/01/2021   ALKPHOS 71 02/01/2021   BILITOT 0.4 02/01/2021   Lab Results  Component Value Date   HGBA1C 6.7 (H) 02/01/2021   HGBA1C 6.2 02/12/2020   HGBA1C 6.3 07/09/2019   HGBA1C 6.2 03/11/2018   HGBA1C 6.2 02/16/2017   Lab Results  Component Value Date   INSULIN 22.7 02/01/2021   Lab Results  Component Value Date   TSH 1.560 02/01/2021   Lab Results  Component Value Date   CHOL 178 02/01/2021   HDL 56 02/01/2021   LDLCALC 107 (H) 02/01/2021   TRIG 81 02/01/2021   CHOLHDL 3.2  11/11/2020   Lab Results  Component Value Date   VD25OH 13.9 (L) 02/01/2021   Lab Results  Component Value Date   WBC 8.6 02/01/2021   HGB 11.1 02/01/2021   HCT 36.3 02/01/2021   MCV 76 (L) 02/01/2021   PLT 503 (H) 11/11/2020   Lab Results  Component Value Date   FERRITIN 5 (L) 08/08/2008    Attestation Statements:   Reviewed by clinician on day of visit: allergies, medications, problem list, medical history, surgical history, family history, social history, and previous encounter notes.  Edmund Hilda, CMA, am acting as transcriptionist for [employer], DO.  I have reviewed the above documentation for accuracy and completeness, and I agree with the above. [name], D.O.  The 21st Century Cures Act was signed into law in 2016 which includes the topic of electronic health records.  This provides immediate access to information in MyChart.  This includes consultation notes, operative notes, office notes, lab results and pathology reports.  If you have any questions about what you read please let us know  at your next visit so we can discuss your concerns and take corrective action if need be.  We are right here with you.

## 2021-05-11 ENCOUNTER — Ambulatory Visit: Payer: Medicaid Other | Admitting: Family Medicine

## 2021-05-11 DIAGNOSIS — F5101 Primary insomnia: Secondary | ICD-10-CM | POA: Diagnosis not present

## 2021-05-11 DIAGNOSIS — F411 Generalized anxiety disorder: Secondary | ICD-10-CM | POA: Diagnosis not present

## 2021-05-11 DIAGNOSIS — F250 Schizoaffective disorder, bipolar type: Secondary | ICD-10-CM | POA: Diagnosis not present

## 2021-05-11 NOTE — Progress Notes (Deleted)
    SUBJECTIVE:   CHIEF COMPLAINT / HPI:   ***  PERTINENT  PMH / PSH: HTN, bipolar I, morbid obesity, prediabetes  OBJECTIVE:   There were no vitals taken for this visit.  General: ***, NAD CV: RRR, no murmurs*** Pulm: CTAB, no wheezes or rales  ASSESSMENT/PLAN:   No problem-specific Assessment & Plan notes found for this encounter.   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

## 2021-05-11 NOTE — Patient Instructions (Incomplete)
It was nice seeing you today! ° ° ° °Please arrive at least 15 minutes prior to your scheduled appointments. ° °Stay well, °Antonyo Hinderer, MD ° [facility] °[phone]  °

## 2021-05-12 DIAGNOSIS — F5101 Primary insomnia: Secondary | ICD-10-CM | POA: Diagnosis not present

## 2021-05-12 DIAGNOSIS — F250 Schizoaffective disorder, bipolar type: Secondary | ICD-10-CM | POA: Diagnosis not present

## 2021-05-12 DIAGNOSIS — F411 Generalized anxiety disorder: Secondary | ICD-10-CM | POA: Diagnosis not present

## 2021-05-13 DIAGNOSIS — F5101 Primary insomnia: Secondary | ICD-10-CM | POA: Diagnosis not present

## 2021-05-13 DIAGNOSIS — F411 Generalized anxiety disorder: Secondary | ICD-10-CM | POA: Diagnosis not present

## 2021-05-13 DIAGNOSIS — F250 Schizoaffective disorder, bipolar type: Secondary | ICD-10-CM | POA: Diagnosis not present

## 2021-05-16 DIAGNOSIS — F250 Schizoaffective disorder, bipolar type: Secondary | ICD-10-CM | POA: Diagnosis not present

## 2021-05-16 DIAGNOSIS — F5101 Primary insomnia: Secondary | ICD-10-CM | POA: Diagnosis not present

## 2021-05-16 DIAGNOSIS — F411 Generalized anxiety disorder: Secondary | ICD-10-CM | POA: Diagnosis not present

## 2021-05-17 DIAGNOSIS — F411 Generalized anxiety disorder: Secondary | ICD-10-CM | POA: Diagnosis not present

## 2021-05-17 DIAGNOSIS — F250 Schizoaffective disorder, bipolar type: Secondary | ICD-10-CM | POA: Diagnosis not present

## 2021-05-17 DIAGNOSIS — F5101 Primary insomnia: Secondary | ICD-10-CM | POA: Diagnosis not present

## 2021-05-18 ENCOUNTER — Ambulatory Visit: Payer: Medicaid Other | Admitting: Family Medicine

## 2021-05-18 NOTE — Progress Notes (Deleted)
    SUBJECTIVE:   CHIEF COMPLAINT / HPI:   ***  PERTINENT  PMH / PSH: HTN, bipolar I, morbid obesity, prediabetes  OBJECTIVE:   There were no vitals taken for this visit.  General: ***, NAD CV: RRR, no murmurs*** Pulm: CTAB, no wheezes or rales  ASSESSMENT/PLAN:   No problem-specific Assessment & Plan notes found for this encounter.   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

## 2021-05-19 DIAGNOSIS — F411 Generalized anxiety disorder: Secondary | ICD-10-CM | POA: Diagnosis not present

## 2021-05-19 DIAGNOSIS — F5101 Primary insomnia: Secondary | ICD-10-CM | POA: Diagnosis not present

## 2021-05-19 DIAGNOSIS — F250 Schizoaffective disorder, bipolar type: Secondary | ICD-10-CM | POA: Diagnosis not present

## 2021-05-20 DIAGNOSIS — F411 Generalized anxiety disorder: Secondary | ICD-10-CM | POA: Diagnosis not present

## 2021-05-20 DIAGNOSIS — F250 Schizoaffective disorder, bipolar type: Secondary | ICD-10-CM | POA: Diagnosis not present

## 2021-05-20 DIAGNOSIS — F5101 Primary insomnia: Secondary | ICD-10-CM | POA: Diagnosis not present

## 2021-05-22 ENCOUNTER — Other Ambulatory Visit: Payer: Self-pay | Admitting: Family Medicine

## 2021-05-22 ENCOUNTER — Other Ambulatory Visit (INDEPENDENT_AMBULATORY_CARE_PROVIDER_SITE_OTHER): Payer: Self-pay | Admitting: Family Medicine

## 2021-05-22 DIAGNOSIS — E1169 Type 2 diabetes mellitus with other specified complication: Secondary | ICD-10-CM

## 2021-05-22 DIAGNOSIS — G5793 Unspecified mononeuropathy of bilateral lower limbs: Secondary | ICD-10-CM

## 2021-05-24 DIAGNOSIS — F411 Generalized anxiety disorder: Secondary | ICD-10-CM | POA: Diagnosis not present

## 2021-05-24 DIAGNOSIS — F5101 Primary insomnia: Secondary | ICD-10-CM | POA: Diagnosis not present

## 2021-05-24 DIAGNOSIS — F250 Schizoaffective disorder, bipolar type: Secondary | ICD-10-CM | POA: Diagnosis not present

## 2021-05-25 DIAGNOSIS — F411 Generalized anxiety disorder: Secondary | ICD-10-CM | POA: Diagnosis not present

## 2021-05-25 DIAGNOSIS — F250 Schizoaffective disorder, bipolar type: Secondary | ICD-10-CM | POA: Diagnosis not present

## 2021-05-25 DIAGNOSIS — F5101 Primary insomnia: Secondary | ICD-10-CM | POA: Diagnosis not present

## 2021-05-26 DIAGNOSIS — F5101 Primary insomnia: Secondary | ICD-10-CM | POA: Diagnosis not present

## 2021-05-26 DIAGNOSIS — F411 Generalized anxiety disorder: Secondary | ICD-10-CM | POA: Diagnosis not present

## 2021-05-26 DIAGNOSIS — F250 Schizoaffective disorder, bipolar type: Secondary | ICD-10-CM | POA: Diagnosis not present

## 2021-05-30 ENCOUNTER — Ambulatory Visit (INDEPENDENT_AMBULATORY_CARE_PROVIDER_SITE_OTHER): Payer: Medicaid Other | Admitting: Family Medicine

## 2021-05-30 DIAGNOSIS — F411 Generalized anxiety disorder: Secondary | ICD-10-CM | POA: Diagnosis not present

## 2021-05-30 DIAGNOSIS — F5101 Primary insomnia: Secondary | ICD-10-CM | POA: Diagnosis not present

## 2021-05-30 DIAGNOSIS — F250 Schizoaffective disorder, bipolar type: Secondary | ICD-10-CM | POA: Diagnosis not present

## 2021-05-31 DIAGNOSIS — F250 Schizoaffective disorder, bipolar type: Secondary | ICD-10-CM | POA: Diagnosis not present

## 2021-05-31 DIAGNOSIS — F411 Generalized anxiety disorder: Secondary | ICD-10-CM | POA: Diagnosis not present

## 2021-05-31 DIAGNOSIS — F5101 Primary insomnia: Secondary | ICD-10-CM | POA: Diagnosis not present

## 2021-06-01 ENCOUNTER — Ambulatory Visit: Payer: Medicaid Other | Admitting: Family Medicine

## 2021-06-01 ENCOUNTER — Other Ambulatory Visit (INDEPENDENT_AMBULATORY_CARE_PROVIDER_SITE_OTHER): Payer: Self-pay | Admitting: Family Medicine

## 2021-06-01 ENCOUNTER — Other Ambulatory Visit: Payer: Self-pay | Admitting: Family Medicine

## 2021-06-01 DIAGNOSIS — F5101 Primary insomnia: Secondary | ICD-10-CM | POA: Diagnosis not present

## 2021-06-01 DIAGNOSIS — F250 Schizoaffective disorder, bipolar type: Secondary | ICD-10-CM | POA: Diagnosis not present

## 2021-06-01 DIAGNOSIS — E1169 Type 2 diabetes mellitus with other specified complication: Secondary | ICD-10-CM

## 2021-06-01 DIAGNOSIS — F39 Unspecified mood [affective] disorder: Secondary | ICD-10-CM

## 2021-06-01 DIAGNOSIS — F411 Generalized anxiety disorder: Secondary | ICD-10-CM | POA: Diagnosis not present

## 2021-06-02 DIAGNOSIS — F411 Generalized anxiety disorder: Secondary | ICD-10-CM | POA: Diagnosis not present

## 2021-06-02 DIAGNOSIS — F250 Schizoaffective disorder, bipolar type: Secondary | ICD-10-CM | POA: Diagnosis not present

## 2021-06-02 DIAGNOSIS — F5101 Primary insomnia: Secondary | ICD-10-CM | POA: Diagnosis not present

## 2021-06-02 NOTE — Telephone Encounter (Signed)
Dr.Opalski ?

## 2021-06-03 DIAGNOSIS — F250 Schizoaffective disorder, bipolar type: Secondary | ICD-10-CM | POA: Diagnosis not present

## 2021-06-03 DIAGNOSIS — F411 Generalized anxiety disorder: Secondary | ICD-10-CM | POA: Diagnosis not present

## 2021-06-03 DIAGNOSIS — F5101 Primary insomnia: Secondary | ICD-10-CM | POA: Diagnosis not present

## 2021-06-06 DIAGNOSIS — F411 Generalized anxiety disorder: Secondary | ICD-10-CM | POA: Diagnosis not present

## 2021-06-06 DIAGNOSIS — F5101 Primary insomnia: Secondary | ICD-10-CM | POA: Diagnosis not present

## 2021-06-06 DIAGNOSIS — F250 Schizoaffective disorder, bipolar type: Secondary | ICD-10-CM | POA: Diagnosis not present

## 2021-06-07 DIAGNOSIS — F250 Schizoaffective disorder, bipolar type: Secondary | ICD-10-CM | POA: Diagnosis not present

## 2021-06-07 DIAGNOSIS — F5101 Primary insomnia: Secondary | ICD-10-CM | POA: Diagnosis not present

## 2021-06-07 DIAGNOSIS — F411 Generalized anxiety disorder: Secondary | ICD-10-CM | POA: Diagnosis not present

## 2021-06-08 DIAGNOSIS — F5101 Primary insomnia: Secondary | ICD-10-CM | POA: Diagnosis not present

## 2021-06-08 DIAGNOSIS — F250 Schizoaffective disorder, bipolar type: Secondary | ICD-10-CM | POA: Diagnosis not present

## 2021-06-08 DIAGNOSIS — F411 Generalized anxiety disorder: Secondary | ICD-10-CM | POA: Diagnosis not present

## 2021-06-09 DIAGNOSIS — F411 Generalized anxiety disorder: Secondary | ICD-10-CM | POA: Diagnosis not present

## 2021-06-09 DIAGNOSIS — F250 Schizoaffective disorder, bipolar type: Secondary | ICD-10-CM | POA: Diagnosis not present

## 2021-06-09 DIAGNOSIS — F5101 Primary insomnia: Secondary | ICD-10-CM | POA: Diagnosis not present

## 2021-06-10 DIAGNOSIS — F5101 Primary insomnia: Secondary | ICD-10-CM | POA: Diagnosis not present

## 2021-06-10 DIAGNOSIS — F411 Generalized anxiety disorder: Secondary | ICD-10-CM | POA: Diagnosis not present

## 2021-06-10 DIAGNOSIS — F250 Schizoaffective disorder, bipolar type: Secondary | ICD-10-CM | POA: Diagnosis not present

## 2021-06-13 DIAGNOSIS — F5101 Primary insomnia: Secondary | ICD-10-CM | POA: Diagnosis not present

## 2021-06-13 DIAGNOSIS — F411 Generalized anxiety disorder: Secondary | ICD-10-CM | POA: Diagnosis not present

## 2021-06-13 DIAGNOSIS — F250 Schizoaffective disorder, bipolar type: Secondary | ICD-10-CM | POA: Diagnosis not present

## 2021-06-14 DIAGNOSIS — F411 Generalized anxiety disorder: Secondary | ICD-10-CM | POA: Diagnosis not present

## 2021-06-14 DIAGNOSIS — F250 Schizoaffective disorder, bipolar type: Secondary | ICD-10-CM | POA: Diagnosis not present

## 2021-06-14 DIAGNOSIS — F5101 Primary insomnia: Secondary | ICD-10-CM | POA: Diagnosis not present

## 2021-06-15 ENCOUNTER — Ambulatory Visit: Payer: Medicaid Other | Admitting: Family Medicine

## 2021-06-15 DIAGNOSIS — F411 Generalized anxiety disorder: Secondary | ICD-10-CM | POA: Diagnosis not present

## 2021-06-15 DIAGNOSIS — F250 Schizoaffective disorder, bipolar type: Secondary | ICD-10-CM | POA: Diagnosis not present

## 2021-06-15 DIAGNOSIS — F5101 Primary insomnia: Secondary | ICD-10-CM | POA: Diagnosis not present

## 2021-06-15 NOTE — Progress Notes (Deleted)
° ° °  SUBJECTIVE:   CHIEF COMPLAINT / HPI: physical  Seen in office 2 months ago for left buttock pain.  CT was obtained which did not reveal any abscess or fistula.  PERTINENT  PMH / PSH: HTN, bipolar I, morbid obesity, prediabetes, tubal ligation  OBJECTIVE:   There were no vitals taken for this visit.  General: ***, NAD CV: RRR, no murmurs*** Pulm: CTAB, no wheezes or rales  ASSESSMENT/PLAN:   No problem-specific Assessment & Plan notes found for this encounter.   HCM - diabetic eye exam - HCV screening - diabetic foot exam - pap smear  Karen Deeds, MD [facility]   {    This will disappear when note is signed, click to select method of visit    :1}

## 2021-06-15 NOTE — Patient Instructions (Incomplete)
It was nice seeing you today! ° ° ° °Please arrive at least 15 minutes prior to your scheduled appointments. ° °Stay well, °Riccardo Holeman, MD °[facility] °[phone]  °

## 2021-06-16 DIAGNOSIS — F5101 Primary insomnia: Secondary | ICD-10-CM | POA: Diagnosis not present

## 2021-06-16 DIAGNOSIS — F411 Generalized anxiety disorder: Secondary | ICD-10-CM | POA: Diagnosis not present

## 2021-06-16 DIAGNOSIS — F250 Schizoaffective disorder, bipolar type: Secondary | ICD-10-CM | POA: Diagnosis not present

## 2021-06-17 DIAGNOSIS — F250 Schizoaffective disorder, bipolar type: Secondary | ICD-10-CM | POA: Diagnosis not present

## 2021-06-17 DIAGNOSIS — F5101 Primary insomnia: Secondary | ICD-10-CM | POA: Diagnosis not present

## 2021-06-17 DIAGNOSIS — F411 Generalized anxiety disorder: Secondary | ICD-10-CM | POA: Diagnosis not present

## 2021-06-19 NOTE — Progress Notes (Signed)
Cardiology Office Note:    Date:  06/20/2021   ID:  Karen Brennan, DOB [birth year], MRN [id number]  PCP:  Zola Button, MD  Cardiologist:  None  Electrophysiologist:  None   Referring MD: Zola Button, MD   No chief complaint on file.   History of Present Illness:    Karen Brennan is a 46 y.o. female with a hx of bipolar disorder, hypertension, prediabetes, anemia, obesity status post gastric bypass who presents for follow-up.  She was referred by Dr. Nori Riis for an initial evaluation of tachycardia, initially seen on 07/14/2019.  She was seen by Dr. Criss Rosales and Dr. Nori Riis on 07/09/2019, reported worsening exercise intolerance and episodes of tachycardia.  Reports that she has episodes where she feels like her heart is racing, will last about 5 minutes and resolve.  Happening about once per month or so.  She reports that she does exercise videos and walks, but becomes fatigued after a few minutes.  States that she gets short of breath with minimal exertion.  She does report that she gets occasional chest pain, which she describes as aching pain in center of her chest.  Episodes occur at rest, denies any chest pain when walking or doing her exercise videos.  Pain lasts a few minutes and resolves.  Smoked 1ppd x 2 years, quit in 2006.  Mother had MI in 76s.    TTE on 07/25/2019 showed normal biventricular function, no significant valvular disease.  Zio patch x14 days on 08/21/2019 showed occasional PVCs (1.7% of beats).  Zio patch x4 days on 11/24/2020 showed occasional PVCs (2% of beats).  Since last clinic visit, she reports that she is doing well.  Denies any chest pain, dyspnea, lower extremity edema, or palpitations.  Reports some lightheadedness with standing.  Has not been checking BP at home.  Reports was off her BP meds last 2 days but has her medications now.  Has lost 17 pounds in last 6 months.  She has been doing healthy weight and wellness.  She has been exercising 3 times per week using foot bike  and weights.   Wt Readings from Last 3 Encounters:  06/20/21 (!) 434 lb 12.8 oz (197.2 kg)  04/14/21 (!) 427 lb 6 oz (193.9 kg)  04/06/21 (!) 422 lb (191.4 kg)     Past Medical History:  Diagnosis Date   ANEMIA 08/14/2008   Qualifier: Diagnosis of  By: Genene Churn MD, Jessica     Anxiety    Asthma    Bipolar 1 disorder ([place])    Blood transfusion    Chest pain    Depression    Edema of both lower extremities    GERD (gastroesophageal reflux disease)    History of bronchitis    Hypertension    Kidney stones 10/09/2011   Migraines 10/09/2011   "often"   Pre-diabetes    Schizophrenia ([place])    SOB (shortness of breath)     Past Surgical History:  Procedure Laterality Date   ANKLE FRACTURE SURGERY   1990's   "left; put screws in"   ENDOMETRIAL ABLATION  ~ 2011   FRACTURE SURGERY     GASTRIC BY PASS     TUBAL LIGATION  2006    Current Medications: Current Meds  Medication Sig   Accu-Chek Softclix Lancets lancets Use as instructed   acetaminophen (TYLENOL) 500 MG tablet Take 1,000 mg by mouth every 6 (six) hours as needed for mild pain.   albuterol (VENTOLIN HFA) 108 (90  Base) MCG/ACT inhaler Inhale 2 puffs into the lungs every 4 (four) hours as needed for wheezing or shortness of breath.   azelastine (ASTELIN) 0.1 % nasal spray Place 2 sprays into both nostrils 2 (two) times daily.   benztropine (COGENTIN) 1 MG tablet Take 1 mg by mouth 2 (two) times daily.   blood glucose meter kit and supplies KIT Dispense based on patient and insurance preference. Use up to four times daily as directed. (FOR ICD-9 250.00, 250.01).   Blood Glucose Monitoring Suppl (ACCU-CHEK GUIDE) w/Device KIT Check blood glucose in AM while fasting   buPROPion ER (WELLBUTRIN SR) 100 MG 12 hr tablet Take 1 tablet (100 mg total) by mouth daily.   cetirizine (ZYRTEC) 10 MG tablet TAKE 1 TABLET BY MOUTH EVERY DAY   FLUoxetine (PROZAC) 40 MG capsule Take 40 mg by mouth daily.   gabapentin (NEURONTIN) 300 MG  capsule TAKE 1 CAPSULE BY MOUTH THREE TIMES A DAY   glucose blood (ACCU-CHEK GUIDE) test strip 1 each by Other route See admin instructions. Use as instructed   guaiFENesin (MUCINEX) 600 MG 12 hr tablet Take by mouth 2 (two) times daily.   guaifenesin (ROBITUSSIN) 100 MG/5ML syrup Take 200 mg by mouth 3 (three) times daily as needed for cough.   haloperidol (HALDOL) 2 MG tablet Take 2 mg by mouth daily.   hydrochlorothiazide (HYDRODIURIL) 25 MG tablet TAKE 1 TABLET (25 MG TOTAL) BY MOUTH DAILY.   hydrOXYzine (VISTARIL) 25 MG capsule Take 25 mg by mouth every evening. Every evening as needed   Lancet Devices (ACCU-CHEK SOFTCLIX) lancets Use as instructed   Magnesium Salicylate 161 MG TABS 1 po q evening before bed or "CALM" supplement nightly   metFORMIN (GLUCOPHAGE-XR) 500 MG 24 hr tablet TAKE 1 TABLET BY MOUTH EVERY DAY WITH BREAKFAST   Multiple Vitamin (MULTIVITAMIN WITH MINERALS) TABS tablet Take 1 tablet by mouth daily.   OLANZapine (ZYPREXA) 20 MG tablet Take 20 mg by mouth at bedtime.    polyethylene glycol powder (GLYCOLAX/MIRALAX) 17 GM/SCOOP powder Take 17 g by mouth 2 (two) times daily as needed. Then daily as needed constipation   Probiotic Product (ACIDOPHILUS PROBIOTIC BLEND PO) Take by mouth.   traZODone (DESYREL) 50 MG tablet Take 50 mg by mouth at bedtime as needed for sleep.   valsartan (DIOVAN) 80 MG tablet Take 1 tablet (80 mg total) by mouth at bedtime.   Vitamin D, Ergocalciferol, (DRISDOL) 1.25 MG (50000 UNIT) CAPS capsule Take 1 capsule (50,000 Units total) by mouth every 7 (seven) days.     Allergies:   Sibutramine hcl monohydrate   Social History   Socioeconomic History   Marital status: Single    Spouse name: Not on file   Number of children: Not on file   Years of education: Not on file   Highest education level: Not on file  Occupational History   Occupation: DISABLE  Tobacco Use   Smoking status: Former    Packs/day: 0.25    Years: 4.00    Pack years:  1.00    Types: Cigarettes    Quit date: 06/12/2004    Years since quitting: 17.0   Smokeless tobacco: Never  Vaping Use   Vaping Use: Never used  Substance and Sexual Activity   Alcohol use: No   Drug use: No   Sexual activity: Yes    Birth control/protection: None    Comment: Novasure  Other Topics Concern   Not on file  Social History Narrative  Not on file   Social Determinants of Health   Financial Resource Strain: Not on file  Food Insecurity: Not on file  Transportation Needs: Not on file  Physical Activity: Not on file  Stress: Not on file  Social Connections: Not on file     Family History: Mother had MI in 57s  ROS:   Please see the history of present illness.     All other systems reviewed and are negative.  EKGs/Labs/Other Studies Reviewed:    The following studies were reviewed today:   EKG:   11/11/2020- The EKG ordered demonstrates sinus tachycardia, rate 110, frequent PVCs 10/31/2019- The ekg ordered most recently demonstrates sinus tachycardia, rate 183, nonspecific T wave flattening  TTE 07/25/19:  1. Left ventricular ejection fraction, by estimation, is 60 to 65%. The  left ventricle has normal function. The left ventricle has no regional  wall motion abnormalities. Left ventricular diastolic parameters are  indeterminate.   2. Right ventricular systolic function is normal. The right ventricular  size is normal. Tricuspid regurgitation signal is inadequate for assessing  PA pressure.   3. The mitral valve is normal in structure and function. Trivial mitral  valve regurgitation. No evidence of mitral stenosis.   4. The aortic valve was not well visualized for morphology but is grossly  normal in function. Aortic valve regurgitation is not visualized. No  aortic stenosis is present.   Cardiac monitor 08/21/19: No significant arrhythmias detected Occasional PVCs (1.7% of beats). Longest bigeminy episode lasted 12 seconds, and longest trigeminy  episode lasted 5 seconds. Patient triggered events corresponded to sinus rhythm +/- PVCs   14 days of data recorded on Zio monitor. Patient had a min HR of 60 bpm, max HR of 171 bpm, and avg HR of 85 bpm. Predominant underlying rhythm was Sinus Rhythm. No VT, SVT, atrial fibrillation, high degree block, or pauses noted. Isolated atrial ectopy was rare (<1%).  Isolated ventricular ectopy was occasional (1.7%).   Longest bigeminy episode lasted 12 seconds, and longest trigeminy episode lasted 5 seconds.  There were 11 triggered events, corresponding to sinus rhythm +/- PVCs. No significant arrhythmias detected.  Recent Labs: 11/11/2020: Platelets 503 02/01/2021: ALT 14; BUN 13; Creatinine, Ser 0.83; Hemoglobin 11.1; Potassium 4.8; Sodium 138; TSH 1.560  Recent Lipid Panel    Component Value Date/Time   CHOL 178 02/01/2021 1025   TRIG 81 02/01/2021 1025   HDL 56 02/01/2021 1025   CHOLHDL 3.2 11/11/2020 1208   CHOLHDL 3.8 12/09/2014 1102   VLDL 25 12/09/2014 1102   LDLCALC 107 (H) 02/01/2021 1025    Physical Exam:    VS:  BP (!) 149/93    Pulse 98    Ht 6' 2"  (1.88 m)    Wt (!) 434 lb 12.8 oz (197.2 kg)    SpO2 100%    BMI 55.83 kg/m     Wt Readings from Last 3 Encounters:  06/20/21 (!) 434 lb 12.8 oz (197.2 kg)  04/14/21 (!) 427 lb 6 oz (193.9 kg)  04/06/21 (!) 422 lb (191.4 kg)     GEN:   in no acute distress HEENT: Normal NECK: No JVD CARDIAC:tachycardic, regular, no murmurs, rubs, gallops RESPIRATORY:  Clear to auscultation without rales, wheezing or rhonchi  ABDOMEN: Soft, non-tender, non-distended MUSCULOSKELETAL:  No edema SKIN: Warm and dry NEUROLOGIC:  Alert and oriented x 3 PSYCHIATRIC:  Normal affect   ASSESSMENT:    1. PVC's (premature ventricular contractions)   2. Daytime somnolence  3. Hyperlipidemia, unspecified hyperlipidemia type   4. Morbid obesity with BMI of 50.0-59.9, adult ([place])   5. Essential hypertension   6. Chest pain of uncertain etiology      PLAN:    Palpitations: Describes episodes where it feels like heart is racing at rest, concerning for arrhythmia.  Zio patch x14 days showed no significant arrhythmias.  Did have occasional PVCs (1.7% of beats).  Frequent PVCs on EKG in clinic 11/11/20.  Zio patch x4 days on 11/24/2020 showed occasional PVCs (2% of beats).  Daytime somnolence: Check sleep study  Dyspnea on exertion: Suspect deconditioning is contributing.  No structural heart disease on TTE  Chest pain: Description suggests noncardiac chest pain, as describes dull aching pain not related to exertion.  No recent episodes  Hypertension: on hydrochlorothiazide 25 mg daily and valsartan 80 mg daily.  BP elevated in clinic today but reports she was off her meds last 2 days.  Recommended checking BP twice daily for next week and calling with results  Hyperlipidemia: LDL 116 on 11/11/2020.  10-year ASCVD risk score 3%, does not meet indication for statin at this time  Prediabetes: On Metformin.  A1c 6.2 on 02/12/2020  Morbid obesity: Body mass index is 55.83 kg/m.  Referred to healthy weight and wellness.Has lost 17 lbs in 6 months.  Check sleep study as above.  RTC in 1 year  Medication Adjustments/Labs and Tests Ordered: Current medicines are reviewed at length with the patient today.  Concerns regarding medicines are outlined above.  No orders of the defined types were placed in this encounter.  No orders of the defined types were placed in this encounter.   Patient Instructions  Medication Instructions:  Continue same medications *If you need a refill on your cardiac medications before your next appointment, please call your pharmacy*   Lab Work: None ordered   Testing/Procedures: Schedule sleep study   Follow-Up: At [facility], you and your health needs are our priority.  As part of our continuing mission to provide you with exceptional heart care, we have created designated Provider Care Teams.  These  Care Teams include your primary Cardiologist (physician) and Advanced Practice Providers (APPs -  Physician Assistants and Nurse Practitioners) who all work together to provide you with the care you need, when you need it.  We recommend signing up for the patient portal called "MyChart".  Sign up information is provided on this After Visit Summary.  MyChart is used to connect with patients for Virtual Visits (Telemedicine).  Patients are able to view lab/test results, encounter notes, upcoming appointments, etc.  Non-urgent messages can be sent to your provider as well.   To learn more about what you can do with MyChart, go to [email or web address].      Your next appointment:  1 year   Call in Oct to schedule Jan appointment    The format for your next appointment: Office    Provider:  Dr.Aynsley Fleet   Check blood pressure twice a day and call office in 1 week to report readings      Signed, Donato Heinz, MD  06/20/2021 8:24 AM    [place]

## 2021-06-20 ENCOUNTER — Encounter (INDEPENDENT_AMBULATORY_CARE_PROVIDER_SITE_OTHER): Payer: Self-pay | Admitting: Family Medicine

## 2021-06-20 ENCOUNTER — Ambulatory Visit (INDEPENDENT_AMBULATORY_CARE_PROVIDER_SITE_OTHER): Payer: Medicaid Other | Admitting: Cardiology

## 2021-06-20 ENCOUNTER — Encounter: Payer: Self-pay | Admitting: Cardiology

## 2021-06-20 ENCOUNTER — Other Ambulatory Visit: Payer: Self-pay | Admitting: Family Medicine

## 2021-06-20 ENCOUNTER — Ambulatory Visit (INDEPENDENT_AMBULATORY_CARE_PROVIDER_SITE_OTHER): Payer: Medicaid Other | Admitting: Family Medicine

## 2021-06-20 ENCOUNTER — Other Ambulatory Visit: Payer: Self-pay

## 2021-06-20 ENCOUNTER — Telehealth: Payer: Self-pay | Admitting: *Deleted

## 2021-06-20 ENCOUNTER — Other Ambulatory Visit (INDEPENDENT_AMBULATORY_CARE_PROVIDER_SITE_OTHER): Payer: Self-pay | Admitting: Family Medicine

## 2021-06-20 VITALS — BP 149/93 | HR 98 | Ht 74.0 in | Wt >= 6400 oz

## 2021-06-20 VITALS — BP 122/67 | HR 97 | Temp 98.2°F | Ht 73.0 in | Wt >= 6400 oz

## 2021-06-20 DIAGNOSIS — F39 Unspecified mood [affective] disorder: Secondary | ICD-10-CM | POA: Diagnosis not present

## 2021-06-20 DIAGNOSIS — I10 Essential (primary) hypertension: Secondary | ICD-10-CM

## 2021-06-20 DIAGNOSIS — E785 Hyperlipidemia, unspecified: Secondary | ICD-10-CM

## 2021-06-20 DIAGNOSIS — I493 Ventricular premature depolarization: Secondary | ICD-10-CM

## 2021-06-20 DIAGNOSIS — F5101 Primary insomnia: Secondary | ICD-10-CM | POA: Diagnosis not present

## 2021-06-20 DIAGNOSIS — Z6841 Body Mass Index (BMI) 40.0 and over, adult: Secondary | ICD-10-CM

## 2021-06-20 DIAGNOSIS — E559 Vitamin D deficiency, unspecified: Secondary | ICD-10-CM | POA: Diagnosis not present

## 2021-06-20 DIAGNOSIS — E1169 Type 2 diabetes mellitus with other specified complication: Secondary | ICD-10-CM | POA: Diagnosis not present

## 2021-06-20 DIAGNOSIS — K5900 Constipation, unspecified: Secondary | ICD-10-CM

## 2021-06-20 DIAGNOSIS — R079 Chest pain, unspecified: Secondary | ICD-10-CM | POA: Diagnosis not present

## 2021-06-20 DIAGNOSIS — F250 Schizoaffective disorder, bipolar type: Secondary | ICD-10-CM | POA: Diagnosis not present

## 2021-06-20 DIAGNOSIS — D649 Anemia, unspecified: Secondary | ICD-10-CM | POA: Diagnosis not present

## 2021-06-20 DIAGNOSIS — D508 Other iron deficiency anemias: Secondary | ICD-10-CM | POA: Diagnosis not present

## 2021-06-20 DIAGNOSIS — Z7689 Persons encountering health services in other specified circumstances: Secondary | ICD-10-CM | POA: Diagnosis not present

## 2021-06-20 DIAGNOSIS — R0683 Snoring: Secondary | ICD-10-CM | POA: Diagnosis not present

## 2021-06-20 DIAGNOSIS — F411 Generalized anxiety disorder: Secondary | ICD-10-CM | POA: Diagnosis not present

## 2021-06-20 DIAGNOSIS — R400 Somnolence: Secondary | ICD-10-CM | POA: Diagnosis not present

## 2021-06-20 MED ORDER — VITAMIN D (ERGOCALCIFEROL) 1.25 MG (50000 UNIT) PO CAPS: 50000.0000 [IU] | ORAL_CAPSULE | ORAL | 0 refills | Status: DC

## 2021-06-20 MED ORDER — BUPROPION HCL ER (SR) 100 MG PO TB12: 100.0000 mg | ORAL_TABLET | Freq: Two times a day (BID) | ORAL | 0 refills | Status: DC

## 2021-06-20 MED ORDER — POLYETHYLENE GLYCOL 3350 17 GM/SCOOP PO POWD: 17.0000 g | Freq: Two times a day (BID) | ORAL | 0 refills | Status: DC | PRN

## 2021-06-20 MED ORDER — METFORMIN HCL 1000 MG PO TABS: 1000.0000 mg | ORAL_TABLET | Freq: Two times a day (BID) | ORAL | 0 refills | Status: DC

## 2021-06-20 NOTE — Telephone Encounter (Signed)
Patient notified of sleep study appointment scheduled for February 7th.

## 2021-06-20 NOTE — Addendum Note (Signed)
Addended by: Neoma Laming on: 06/20/2021 09:29 AM   Modules accepted: Orders

## 2021-06-20 NOTE — Patient Instructions (Signed)
Medication Instructions:  Continue same medications *If you need a refill on your cardiac medications before your next appointment, please call your pharmacy*   Lab Work: None ordered   Testing/Procedures: Schedule sleep study   Follow-Up: At [facility], you and your health needs are our priority.  As part of our continuing mission to provide you with exceptional heart care, we have created designated Provider Care Teams.  These Care Teams include your primary Cardiologist (physician) and Advanced Practice Providers (APPs -  Physician Assistants and Nurse Practitioners) who all work together to provide you with the care you need, when you need it.  We recommend signing up for the patient portal called "MyChart".  Sign up information is provided on this After Visit Summary.  MyChart is used to connect with patients for Virtual Visits (Telemedicine).  Patients are able to view lab/test results, encounter notes, upcoming appointments, etc.  Non-urgent messages can be sent to your provider as well.   To learn more about what you can do with MyChart, go to [email or web address].      Your next appointment:  1 year   Call in Oct to schedule Jan appointment    The format for your next appointment: Office    Provider:  Dr.Schumann   Check blood pressure twice a day and call office in 1 week to report readings

## 2021-06-21 DIAGNOSIS — F411 Generalized anxiety disorder: Secondary | ICD-10-CM | POA: Diagnosis not present

## 2021-06-21 DIAGNOSIS — F5101 Primary insomnia: Secondary | ICD-10-CM | POA: Diagnosis not present

## 2021-06-21 DIAGNOSIS — F250 Schizoaffective disorder, bipolar type: Secondary | ICD-10-CM | POA: Diagnosis not present

## 2021-06-21 LAB — IRON AND TIBC
Iron Saturation: 7 % — CL (ref 15–55)
Iron: 30 ug/dL (ref 27–159)
Total Iron Binding Capacity: 444 ug/dL (ref 250–450)
UIBC: 414 ug/dL (ref 131–425)

## 2021-06-21 LAB — CBC WITH DIFFERENTIAL/PLATELET
Basophils Absolute: 0 10*3/uL (ref 0.0–0.2)
Basos: 1 %
EOS (ABSOLUTE): 0.1 10*3/uL (ref 0.0–0.4)
Eos: 1 %
Hematocrit: 34.7 % (ref 34.0–46.6)
Hemoglobin: 10.5 g/dL — ABNORMAL LOW (ref 11.1–15.9)
Immature Grans (Abs): 0 10*3/uL (ref 0.0–0.1)
Immature Granulocytes: 0 %
Lymphocytes Absolute: 3.8 10*3/uL — ABNORMAL HIGH (ref 0.7–3.1)
Lymphs: 43 %
MCH: 23.6 pg — ABNORMAL LOW (ref 26.6–33.0)
MCHC: 30.3 g/dL — ABNORMAL LOW (ref 31.5–35.7)
MCV: 78 fL — ABNORMAL LOW (ref 79–97)
Monocytes Absolute: 0.6 10*3/uL (ref 0.1–0.9)
Monocytes: 7 %
Neutrophils Absolute: 4.3 10*3/uL (ref 1.4–7.0)
Neutrophils: 48 %
Platelets: 436 10*3/uL (ref 150–450)
RBC: 4.45 x10E6/uL (ref 3.77–5.28)
RDW: 14.1 % (ref 11.7–15.4)
WBC: 8.8 10*3/uL (ref 3.4–10.8)

## 2021-06-21 LAB — BASIC METABOLIC PANEL
BUN/Creatinine Ratio: 15 (ref 9–23)
BUN: 13 mg/dL (ref 6–24)
CO2: 23 mmol/L (ref 20–29)
Calcium: 9.1 mg/dL (ref 8.7–10.2)
Chloride: 101 mmol/L (ref 96–106)
Creatinine, Ser: 0.86 mg/dL (ref 0.57–1.00)
Glucose: 92 mg/dL (ref 70–99)
Potassium: 4.8 mmol/L (ref 3.5–5.2)
Sodium: 139 mmol/L (ref 134–144)
eGFR: 85 mL/min/{1.73_m2} (ref >59)

## 2021-06-21 LAB — FOLATE: Folate: 11 ng/mL (ref >3.0)

## 2021-06-21 LAB — HEMOGLOBIN A1C
Est. average glucose Bld gHb Est-mCnc: 120 mg/dL
Hgb A1c MFr Bld: 5.8 % — ABNORMAL HIGH (ref 4.8–5.6)

## 2021-06-21 LAB — VITAMIN B12: Vitamin B-12: 141 pg/mL — ABNORMAL LOW (ref 232–1245)

## 2021-06-21 LAB — VITAMIN D 25 HYDROXY (VIT D DEFICIENCY, FRACTURES): Vit D, 25-Hydroxy: 22.9 ng/mL — ABNORMAL LOW (ref 30.0–100.0)

## 2021-06-21 LAB — FERRITIN: Ferritin: 8 ng/mL — ABNORMAL LOW (ref 15–150)

## 2021-06-22 DIAGNOSIS — F5101 Primary insomnia: Secondary | ICD-10-CM | POA: Diagnosis not present

## 2021-06-22 DIAGNOSIS — F411 Generalized anxiety disorder: Secondary | ICD-10-CM | POA: Diagnosis not present

## 2021-06-22 DIAGNOSIS — F250 Schizoaffective disorder, bipolar type: Secondary | ICD-10-CM | POA: Diagnosis not present

## 2021-06-22 NOTE — Progress Notes (Addendum)
Chief Complaint:   OBESITY Karen Brennan is here to discuss her progress with her obesity treatment plan along with follow-up of her obesity related diagnoses. Karen Brennan is on the Category 2 Plan and states she is following her eating plan approximately 50% of the time. Karen Brennan states she is lifting weights and using the foot bike for 30 minutes 3 times per week.  Today's visit was #: 8 Starting weight: 443 lbs Starting date: 02/01/2021 Today's weight: 429 lbs Today's date: 06/20/2021 Total lbs lost to date: 14 Total lbs lost since last in-office visit: 0  Interim History: Karen Brennan notes the holidays were difficult, and it was hard to eat on the plan - with too many temptations.  She notes that all "bad" foods are out of her house now.   She is going grocery shopping today to ensure she has everything to succeed.  Of note, Karen Brennan's daughter did lose 9 lbs today.   Subjective:   1. Type 2 diabetes mellitus with other specified complication, without long-term current use of insulin (HCC) Karen Brennan takes metformin XR 500 mg tablets currently twice every morning per PCP.  Not checking BS's but has BGM/ supplies. Denies concerns  2. Vitamin D deficiency Karen Brennan is currently taking prescription vitamin D 50,000 IU each week. Endorses regular use.  She denies nausea, vomiting or muscle weakness.  3. Other iron deficiency anemia Karen Brennan is not a vegetarian.  She has a history of weight loss surgery. 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 She went from 505 lbs to 420 lbs in 1 year, and she started regaining soon after.  4. Constipation, unspecified constipation type Karen Brennan hasn't been eating regularly or taking miralax. She notes constipation but no pain, cramping or other GI symptoms.  5. Mood disorder (HCC) with emotional eating Karen Brennan's psych doctor at [facility] treats her for this. She notes she hs been having more emotional eating lately than usual. She is on Wellbutrin per Psych among many other  medications.   Assessment/Plan:   Orders Placed This Encounter  Procedures   Hemoglobin A1c   Basic metabolic panel   VITAMIN D 25 Hydroxy (Vit-D Deficiency, Fractures)   CBC with Differential/Platelet   Vitamin B12   Folate   Iron and TIBC   Ferritin    Medications Discontinued During This Encounter  Medication Reason   metFORMIN (GLUCOPHAGE-XR) 500 MG 24 hr tablet    buPROPion ER (WELLBUTRIN SR) 100 MG 12 hr tablet Reorder   polyethylene glycol powder (GLYCOLAX/MIRALAX) 17 GM/SCOOP powder Reorder   Vitamin D, Ergocalciferol, (DRISDOL) 1.25 MG (50000 UNIT) CAPS capsule Reorder     Meds ordered this encounter  Medications   buPROPion ER (WELLBUTRIN SR) 100 MG 12 hr tablet    Sig: Take 1 tablet (100 mg total) by mouth increase 2 (two) times daily.    Dispense:  60 tablet    Refill:  0   polyethylene glycol powder (GLYCOLAX/MIRALAX) 17 GM/SCOOP powder    Sig: Take 17 g by mouth 2 (two) times daily as needed. Then daily as needed constipation    Dispense:  116 g    Refill:  0    30 d supply;  ** OV for RF **   Do not send RF request   Vitamin D, Ergocalciferol, (DRISDOL) 1.25 MG (50000 UNIT) CAPS capsule    Sig: Take 1 capsule (50,000 Units total) by mouth every 7 (seven) days.    Dispense:  4 capsule    Refill:  0  30 d supply;  ** OV for RF **   Do not send RF request   metFORMIN (GLUCOPHAGE) 1000 MG tablet    Sig: Take 1 tablet (1,000 mg total) by mouth 2 (two) times daily with a meal.    Dispense:  60 tablet    Refill:  0     1. Type 2 diabetes mellitus with other specified complication, without long-term current use of insulin (HCC) We will check labs today. Karen Brennan agreed to discontinue metformin XR; and change to metformin 12-hr, BID-  1,000 mg in the AM and 1,000 mg in the PM, (12 hour formula for better carbohydrate craving control) with no refills.  - Will consider GLP-1 in future but we are limited due to medication coverage/ insurance. Likely Trulicity will  need to be used. - Good blood sugar control is important to decrease the likelihood of diabetic complications such as nephropathy, neuropathy, limb loss, blindness, coronary artery disease, and death. Intensive lifestyle modification including diet, exercise and weight loss are the first line of treatment for diabetes.   - metFORMIN (GLUCOPHAGE) 1000 MG tablet; Take 1 tablet (1,000 mg total) by mouth 2 (two) times daily with a meal.  Dispense: 60 tablet; Refill: 0 - Hemoglobin A1c - Basic metabolic panel   2. Vitamin D deficiency We will check labs today. We will refill prescription Vitamin D for 1 month. Guida will follow-up for routine testing of Vitamin D, at least 2-3 times per year to avoid over-replacement.  - Vitamin D, Ergocalciferol, (DRISDOL) 1.25 MG (50000 UNIT) CAPS capsule; Take 1 capsule (50,000 Units total) by mouth every 7 (seven) days.  Dispense: 4 capsule; Refill: 0 - VITAMIN D 25 Hydroxy (Vit-D Deficiency, Fractures)   3. Other iron deficiency anemia We will check labs today, and we will follow up at Korissa's next office visit. Orders and follow up as documented in patient record.  Counseling Iron is essential for our bodies to make red blood cells.  Reasons that someone may be deficient include: an iron-deficient diet (more likely in those following vegan or vegetarian diets), women with heavy menses, patients with GI disorders or poor absorption, patients that have had bariatric surgery, frequent blood donors, patients with cancer, and patients with heart disease.   Iron-rich foods include dark leafy greens, red and white meats, eggs, seafood, and beans.   Certain foods and drinks prevent your body from absorbing iron properly. Avoid eating these foods in the same meal as iron-rich foods or with iron supplements. These foods include: coffee, black tea, and red wine; milk, dairy products, and foods that are high in calcium; beans and soybeans; whole grains.  Constipation can  be a side effect of iron supplementation. Increased water and fiber intake are helpful. Water goal: > 2 liters/day. Fiber goal: > 25 grams/day.  - CBC with Differential/Platelet - Vitamin B12 - Folate - Iron and TIBC - Ferritin   4. Constipation, unspecified constipation type We will refill miralax for 1 month. Contrina was informed that a decrease in bowel movement frequency is normal while losing weight, but stools should not be hard or painful. Orders and follow up as documented in patient record.   Counseling Getting to Good Bowel Health: Your goal is to have one soft bowel movement each day. Drink at least 8 glasses of water each day. Eat plenty of fiber (goal is over 25 grams each day). It is best to get most of your fiber from dietary sources which includes leafy green vegetables,  fresh fruit, and whole grains. You may need to add fiber with the help of OTC fiber supplements. These include Metamucil, Citrucel, and Flaxseed. If you are still having trouble, try adding Miralax or Magnesium Citrate. If all of these changes do not work, Dietitiancontact your physician.  - polyethylene glycol powder (GLYCOLAX/MIRALAX) 17 GM/SCOOP powder; Take 17 g by mouth 2 (two) times daily as needed. Then daily as needed constipation  Dispense: 116 g; Refill: 0   5. Mood disorder (HCC) with emotional eating We discussed "10-15 minutes of distraction activities" and mindful eating practices for help with her emotional eating.  Also, recommend walking and regular counseling visits for stress management. She is to walk for 5-10 minutes a day to start.  - She is to ask her psych doctor about if she can increase Wellbutrin or not beyond our current adjustment.  Karen Kobus-  Davian agreed to increase Wellbutrin SR to 100 mg BID (up from once daily), with no refills.  - buPROPion ER (WELLBUTRIN SR) 100 MG 12 hr tablet; Take 1 tablet (100 mg total) by mouth 2 (two) times daily.  Dispense: 60 tablet; Refill: 0 - Basic metabolic  panel   6. Obesity with current BMI of 56.7 Karen Brennan is currently in the action stage of change. As such, her goal is to continue with weight loss efforts. She has agreed to the Category 2 Plan.   Exercise goals: For substantial health benefits, adults should do at least 150 minutes (2 hours and 30 minutes) a week of moderate-intensity, or 75 minutes (1 hour and 15 minutes) a week of vigorous-intensity aerobic physical activity, or an equivalent combination of moderate- and vigorous-intensity aerobic activity. Aerobic activity should be performed in episodes of at least 10 minutes, and preferably, it should be spread throughout the week. Increase as tolerated.  Behavioral modification strategies: increasing lean protein intake, decreasing simple carbohydrates, emotional eating strategies, and avoiding temptations.  Karen Brennan has agreed to follow-up with our clinic in 2 to 3 weeks. She was informed of the importance of frequent follow-up visits to maximize her success with intensive lifestyle modifications for her multiple health conditions.   Karen Brennan was informed we would discuss her lab results at her next visit unless there is a critical issue that needs to be addressed sooner. Karen Brennan agreed to keep her next visit at the agreed upon time to discuss these results.   Objective:   Blood pressure 122/67, pulse 97, temperature 98.2 F (36.8 C), height 6\' 1"  (1.854 m), weight (!) 429 lb (194.6 kg), SpO2 100 %. Body mass index is 56.6 kg/m.  General: Cooperative, alert, well developed, in no acute distress. HEENT: Conjunctivae and lids unremarkable. Cardiovascular: Regular rhythm.  Lungs: Normal work of breathing. Neurologic: No focal deficits.   Lab Results  Component Value Date   CREATININE 0.86 06/20/2021   BUN 13 06/20/2021   NA 139 06/20/2021   K 4.8 06/20/2021   CL 101 06/20/2021   CO2 23 06/20/2021   Lab Results  Component Value Date   ALT 14 02/01/2021   AST 17 02/01/2021   ALKPHOS  71 02/01/2021   BILITOT 0.4 02/01/2021   Lab Results  Component Value Date   HGBA1C 5.8 (H) 06/20/2021   HGBA1C 6.7 (H) 02/01/2021   HGBA1C 6.2 02/12/2020   HGBA1C 6.3 07/09/2019   HGBA1C 6.2 03/11/2018   Lab Results  Component Value Date   INSULIN 22.7 02/01/2021   Lab Results  Component Value Date   TSH 1.560 02/01/2021  Lab Results  Component Value Date   CHOL 178 02/01/2021   HDL 56 02/01/2021   LDLCALC 107 (H) 02/01/2021   TRIG 81 02/01/2021   CHOLHDL 3.2 11/11/2020   Lab Results  Component Value Date   VD25OH 22.9 (L) 06/20/2021   VD25OH 13.9 (L) 02/01/2021   Lab Results  Component Value Date   WBC 8.8 06/20/2021   HGB 10.5 (L) 06/20/2021   HCT 34.7 06/20/2021   MCV 78 (L) 06/20/2021   PLT 436 06/20/2021   Lab Results  Component Value Date   IRON 30 06/20/2021   TIBC 444 06/20/2021   FERRITIN 8 (L) 06/20/2021   Attestation Statements:   Reviewed by clinician on day of visit: allergies, medications, problem list, medical history, surgical history, family history, social history, and previous encounter notes.  Time spent on visit including pre-visit chart review and post-visit care and charting was 40+ minutes.    Trude McburneyI, Sharon Martin, am acting as transcriptionist for Marsh & McLennanDeborah Delesa Kawa, DO.  I have reviewed the above documentation for accuracy and completeness, and I agree with the above. [name]-  Charlea Nardo J Yaslyn Cumby, D.O.  The 21st Century Cures Act was signed into law in 2016 which includes the topic of electronic health records.  This provides immediate access to information in MyChart.  This includes consultation notes, operative notes, office notes, lab results and pathology reports.  If you have any questions about what you read please let us know at your next visit so we can discuss your concerns and take corrective action if need be.  We are right here with you.

## 2021-06-23 ENCOUNTER — Telehealth: Payer: Self-pay | Admitting: Family Medicine

## 2021-06-23 DIAGNOSIS — F411 Generalized anxiety disorder: Secondary | ICD-10-CM | POA: Diagnosis not present

## 2021-06-23 DIAGNOSIS — F250 Schizoaffective disorder, bipolar type: Secondary | ICD-10-CM | POA: Diagnosis not present

## 2021-06-23 DIAGNOSIS — F5101 Primary insomnia: Secondary | ICD-10-CM | POA: Diagnosis not present

## 2021-06-23 NOTE — Telephone Encounter (Signed)
**  After Hours/ Emergency Line Call**  Received a call to report that [name] noted an elevated heart rate when checking her blood pressure earlier today. Stating she was checking her blood pressure and her pulse was 122 so she was calling to check if she should do anything.  She denies any dizziness or confusion or other symptoms at the time she checked it. I asked her to recheck it again while on the phone with her. She states her pulse was 110. She denies caffeine or alcohol.  She denies any new medications.  Patient states she does have a appointment with our clinic tomorrow.  I explained to her that since she is not having any symptoms and her heart rate has improved and there are a number of factors that can cause her heart rate to be high I do not think she needs to come in to be evaluated tonight and she can maintain her appointment tomorrow.  I did discuss that if she develops any shortness of breath, chest pain, dizziness, confusion that she needs to come in and be evaluated in the emergency department tonight.  I recommended for reassurance that she could lie down in bed for 5 or 10 minutes and decompress and check her pulse again and it would likely be improved, and that she should also mention this at her appointment tomorrow where she can be assessed for things such as arrhythmias.  Red flags discussed.  Patient's questions answered.  Will forward to PCP.  Lurline Del, DO PGY-3, [facility] 06/23/2021 9:18 PM

## 2021-06-24 ENCOUNTER — Ambulatory Visit (INDEPENDENT_AMBULATORY_CARE_PROVIDER_SITE_OTHER): Payer: Medicaid Other | Admitting: Family Medicine

## 2021-06-24 ENCOUNTER — Other Ambulatory Visit (INDEPENDENT_AMBULATORY_CARE_PROVIDER_SITE_OTHER): Payer: Self-pay | Admitting: Family Medicine

## 2021-06-24 ENCOUNTER — Other Ambulatory Visit: Payer: Self-pay

## 2021-06-24 VITALS — BP 131/83 | HR 114 | Ht 74.0 in | Wt >= 6400 oz

## 2021-06-24 DIAGNOSIS — F250 Schizoaffective disorder, bipolar type: Secondary | ICD-10-CM | POA: Diagnosis not present

## 2021-06-24 DIAGNOSIS — E1169 Type 2 diabetes mellitus with other specified complication: Secondary | ICD-10-CM

## 2021-06-24 DIAGNOSIS — F411 Generalized anxiety disorder: Secondary | ICD-10-CM | POA: Diagnosis not present

## 2021-06-24 DIAGNOSIS — Z6841 Body Mass Index (BMI) 40.0 and over, adult: Secondary | ICD-10-CM

## 2021-06-24 DIAGNOSIS — R000 Tachycardia, unspecified: Secondary | ICD-10-CM

## 2021-06-24 DIAGNOSIS — F39 Unspecified mood [affective] disorder: Secondary | ICD-10-CM

## 2021-06-24 DIAGNOSIS — F5101 Primary insomnia: Secondary | ICD-10-CM | POA: Diagnosis not present

## 2021-06-24 DIAGNOSIS — Z7689 Persons encountering health services in other specified circumstances: Secondary | ICD-10-CM | POA: Diagnosis not present

## 2021-06-24 DIAGNOSIS — E559 Vitamin D deficiency, unspecified: Secondary | ICD-10-CM

## 2021-06-24 MED ORDER — CLOTRIMAZOLE 1 % EX CREA: TOPICAL_CREAM | CUTANEOUS | 0 refills | Status: DC

## 2021-06-24 MED ORDER — ONDANSETRON 4 MG PO TBDP: 4.0000 mg | ORAL_TABLET | Freq: Three times a day (TID) | ORAL | 0 refills | Status: DC | PRN

## 2021-06-24 NOTE — Patient Instructions (Signed)
It was nice seeing you today!  Take Zofran every 8 hours as needed.  Stop the Wellbutrin.  Stay well, Karen Deeds, MD [facility] [phone]  --  Make sure to check out at the front desk before you leave today.  Please arrive at least 15 minutes prior to your scheduled appointments.  If you had blood work today, I will send you a MyChart message or a letter if results are normal. Otherwise, I will give you a call.  If you had a referral placed, they will call you to set up an appointment. Please give [place] a call if you don't hear back in the next 2 weeks.  If you need additional refills before your next appointment, please call your pharmacy first.

## 2021-06-24 NOTE — Progress Notes (Signed)
° ° °  SUBJECTIVE:   CHIEF COMPLAINT / HPI:   Patient called the after-hours emergency line last night for tachycardia with measuring BP, see chart for further details.  Pulse was 122, improved to 110 on recheck.  No new medications, caffeine, or alcohol.  Initially scheduled for physical but given recent symptoms, this was primarily addressed.  Patient states tachycardia yesterday evening which did improve.  But then she had a recurrence of it this morning.  Symptoms associate include nausea.  Denies chest pain, shortness of breath, abdominal pain, vomiting.  She has not had any recent travel.  PERTINENT  PMH / PSH: HTN, bipolar 1 disorder, depression, morbid obesity,  OBJECTIVE:   BP 131/83    Pulse (!) 114    Ht 6\' 2"  (1.88 m)    Wt (!) 422 lb 4 oz (191.5 kg)    SpO2 98%    BMI 54.21 kg/m   General: Alert, NAD CV: Tachycardic, regular rhythm Pulm: CTAB, no wheezes or rales Abdomen: Soft, nontender  EKG -reveals HR 91 bpm, rhythm is regular with 1 PVC visualized, QTC of 460, PR and QRS intervals, no ST segment changes  ASSESSMENT/PLAN:   Tachycardia On further chart review, this has been an issue in the past.  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  She was started on bupropion which was increased a few days ago but she has not started with the higher dose yet. - d/c buproprion - possibly contributing to tachycardia and nausea - Zofran ODT prn - consider beta blocker if not improved   Advised to schedule separate appointment for physical  , MD [facility]

## 2021-06-24 NOTE — Assessment & Plan Note (Signed)
On further chart review, this has been an issue in the past.  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  She was started on bupropion which was increased a few days ago but she has not started with the higher dose yet. - d/c buproprion - possibly contributing to tachycardia and nausea - Zofran ODT prn - consider beta blocker if not improved

## 2021-06-27 DIAGNOSIS — F250 Schizoaffective disorder, bipolar type: Secondary | ICD-10-CM | POA: Diagnosis not present

## 2021-06-27 DIAGNOSIS — F5101 Primary insomnia: Secondary | ICD-10-CM | POA: Diagnosis not present

## 2021-06-27 DIAGNOSIS — F411 Generalized anxiety disorder: Secondary | ICD-10-CM | POA: Diagnosis not present

## 2021-06-28 DIAGNOSIS — F411 Generalized anxiety disorder: Secondary | ICD-10-CM | POA: Diagnosis not present

## 2021-06-28 DIAGNOSIS — F5101 Primary insomnia: Secondary | ICD-10-CM | POA: Diagnosis not present

## 2021-06-28 DIAGNOSIS — F250 Schizoaffective disorder, bipolar type: Secondary | ICD-10-CM | POA: Diagnosis not present

## 2021-06-29 DIAGNOSIS — F5101 Primary insomnia: Secondary | ICD-10-CM | POA: Diagnosis not present

## 2021-06-29 DIAGNOSIS — F411 Generalized anxiety disorder: Secondary | ICD-10-CM | POA: Diagnosis not present

## 2021-06-29 DIAGNOSIS — F250 Schizoaffective disorder, bipolar type: Secondary | ICD-10-CM | POA: Diagnosis not present

## 2021-06-30 DIAGNOSIS — F5101 Primary insomnia: Secondary | ICD-10-CM | POA: Diagnosis not present

## 2021-06-30 DIAGNOSIS — F411 Generalized anxiety disorder: Secondary | ICD-10-CM | POA: Diagnosis not present

## 2021-06-30 DIAGNOSIS — F250 Schizoaffective disorder, bipolar type: Secondary | ICD-10-CM | POA: Diagnosis not present

## 2021-07-01 DIAGNOSIS — F5101 Primary insomnia: Secondary | ICD-10-CM | POA: Diagnosis not present

## 2021-07-01 DIAGNOSIS — F411 Generalized anxiety disorder: Secondary | ICD-10-CM | POA: Diagnosis not present

## 2021-07-01 DIAGNOSIS — F250 Schizoaffective disorder, bipolar type: Secondary | ICD-10-CM | POA: Diagnosis not present

## 2021-07-04 DIAGNOSIS — F411 Generalized anxiety disorder: Secondary | ICD-10-CM | POA: Diagnosis not present

## 2021-07-04 DIAGNOSIS — F5101 Primary insomnia: Secondary | ICD-10-CM | POA: Diagnosis not present

## 2021-07-04 DIAGNOSIS — F250 Schizoaffective disorder, bipolar type: Secondary | ICD-10-CM | POA: Diagnosis not present

## 2021-07-05 DIAGNOSIS — F411 Generalized anxiety disorder: Secondary | ICD-10-CM | POA: Diagnosis not present

## 2021-07-05 DIAGNOSIS — F5101 Primary insomnia: Secondary | ICD-10-CM | POA: Diagnosis not present

## 2021-07-05 DIAGNOSIS — F250 Schizoaffective disorder, bipolar type: Secondary | ICD-10-CM | POA: Diagnosis not present

## 2021-07-06 DIAGNOSIS — F5101 Primary insomnia: Secondary | ICD-10-CM | POA: Diagnosis not present

## 2021-07-06 DIAGNOSIS — F411 Generalized anxiety disorder: Secondary | ICD-10-CM | POA: Diagnosis not present

## 2021-07-06 DIAGNOSIS — Z7689 Persons encountering health services in other specified circumstances: Secondary | ICD-10-CM | POA: Diagnosis not present

## 2021-07-06 DIAGNOSIS — F250 Schizoaffective disorder, bipolar type: Secondary | ICD-10-CM | POA: Diagnosis not present

## 2021-07-07 DIAGNOSIS — F250 Schizoaffective disorder, bipolar type: Secondary | ICD-10-CM | POA: Diagnosis not present

## 2021-07-07 DIAGNOSIS — F411 Generalized anxiety disorder: Secondary | ICD-10-CM | POA: Diagnosis not present

## 2021-07-07 DIAGNOSIS — F5101 Primary insomnia: Secondary | ICD-10-CM | POA: Diagnosis not present

## 2021-07-08 DIAGNOSIS — F5101 Primary insomnia: Secondary | ICD-10-CM | POA: Diagnosis not present

## 2021-07-08 DIAGNOSIS — F411 Generalized anxiety disorder: Secondary | ICD-10-CM | POA: Diagnosis not present

## 2021-07-08 DIAGNOSIS — F250 Schizoaffective disorder, bipolar type: Secondary | ICD-10-CM | POA: Diagnosis not present

## 2021-07-11 DIAGNOSIS — F5101 Primary insomnia: Secondary | ICD-10-CM | POA: Diagnosis not present

## 2021-07-11 DIAGNOSIS — F411 Generalized anxiety disorder: Secondary | ICD-10-CM | POA: Diagnosis not present

## 2021-07-11 DIAGNOSIS — F250 Schizoaffective disorder, bipolar type: Secondary | ICD-10-CM | POA: Diagnosis not present

## 2021-07-12 ENCOUNTER — Ambulatory Visit (INDEPENDENT_AMBULATORY_CARE_PROVIDER_SITE_OTHER): Payer: Medicaid Other | Admitting: Family Medicine

## 2021-07-12 DIAGNOSIS — F411 Generalized anxiety disorder: Secondary | ICD-10-CM | POA: Diagnosis not present

## 2021-07-12 DIAGNOSIS — F250 Schizoaffective disorder, bipolar type: Secondary | ICD-10-CM | POA: Diagnosis not present

## 2021-07-12 DIAGNOSIS — F5101 Primary insomnia: Secondary | ICD-10-CM | POA: Diagnosis not present

## 2021-07-13 DIAGNOSIS — F250 Schizoaffective disorder, bipolar type: Secondary | ICD-10-CM | POA: Diagnosis not present

## 2021-07-13 DIAGNOSIS — F411 Generalized anxiety disorder: Secondary | ICD-10-CM | POA: Diagnosis not present

## 2021-07-13 DIAGNOSIS — F5101 Primary insomnia: Secondary | ICD-10-CM | POA: Diagnosis not present

## 2021-07-14 DIAGNOSIS — F5101 Primary insomnia: Secondary | ICD-10-CM | POA: Diagnosis not present

## 2021-07-14 DIAGNOSIS — F411 Generalized anxiety disorder: Secondary | ICD-10-CM | POA: Diagnosis not present

## 2021-07-14 DIAGNOSIS — F250 Schizoaffective disorder, bipolar type: Secondary | ICD-10-CM | POA: Diagnosis not present

## 2021-07-15 DIAGNOSIS — F411 Generalized anxiety disorder: Secondary | ICD-10-CM | POA: Diagnosis not present

## 2021-07-15 DIAGNOSIS — F250 Schizoaffective disorder, bipolar type: Secondary | ICD-10-CM | POA: Diagnosis not present

## 2021-07-15 DIAGNOSIS — F5101 Primary insomnia: Secondary | ICD-10-CM | POA: Diagnosis not present

## 2021-07-18 DIAGNOSIS — F250 Schizoaffective disorder, bipolar type: Secondary | ICD-10-CM | POA: Diagnosis not present

## 2021-07-18 DIAGNOSIS — F411 Generalized anxiety disorder: Secondary | ICD-10-CM | POA: Diagnosis not present

## 2021-07-18 DIAGNOSIS — F5101 Primary insomnia: Secondary | ICD-10-CM | POA: Diagnosis not present

## 2021-07-19 ENCOUNTER — Other Ambulatory Visit: Payer: Self-pay

## 2021-07-19 ENCOUNTER — Ambulatory Visit (HOSPITAL_BASED_OUTPATIENT_CLINIC_OR_DEPARTMENT_OTHER): Payer: Medicaid Other | Attending: Cardiology | Admitting: Cardiovascular Disease

## 2021-07-19 DIAGNOSIS — G4733 Obstructive sleep apnea (adult) (pediatric): Secondary | ICD-10-CM

## 2021-07-19 DIAGNOSIS — I10 Essential (primary) hypertension: Secondary | ICD-10-CM | POA: Diagnosis not present

## 2021-07-19 DIAGNOSIS — R0683 Snoring: Secondary | ICD-10-CM

## 2021-07-19 DIAGNOSIS — Z6841 Body Mass Index (BMI) 40.0 and over, adult: Secondary | ICD-10-CM | POA: Diagnosis not present

## 2021-07-19 DIAGNOSIS — I493 Ventricular premature depolarization: Secondary | ICD-10-CM | POA: Insufficient documentation

## 2021-07-19 DIAGNOSIS — R079 Chest pain, unspecified: Secondary | ICD-10-CM

## 2021-07-19 DIAGNOSIS — E785 Hyperlipidemia, unspecified: Secondary | ICD-10-CM | POA: Diagnosis not present

## 2021-07-19 DIAGNOSIS — F250 Schizoaffective disorder, bipolar type: Secondary | ICD-10-CM | POA: Diagnosis not present

## 2021-07-19 DIAGNOSIS — R400 Somnolence: Secondary | ICD-10-CM | POA: Diagnosis not present

## 2021-07-19 DIAGNOSIS — F5101 Primary insomnia: Secondary | ICD-10-CM | POA: Diagnosis not present

## 2021-07-19 DIAGNOSIS — F411 Generalized anxiety disorder: Secondary | ICD-10-CM | POA: Diagnosis not present

## 2021-07-20 DIAGNOSIS — F411 Generalized anxiety disorder: Secondary | ICD-10-CM | POA: Diagnosis not present

## 2021-07-20 DIAGNOSIS — F250 Schizoaffective disorder, bipolar type: Secondary | ICD-10-CM | POA: Diagnosis not present

## 2021-07-20 DIAGNOSIS — Z7689 Persons encountering health services in other specified circumstances: Secondary | ICD-10-CM | POA: Diagnosis not present

## 2021-07-20 DIAGNOSIS — F5101 Primary insomnia: Secondary | ICD-10-CM | POA: Diagnosis not present

## 2021-07-21 ENCOUNTER — Telehealth: Payer: Medicaid Other | Admitting: Physician Assistant

## 2021-07-21 ENCOUNTER — Telehealth: Payer: Medicaid Other

## 2021-07-21 DIAGNOSIS — U071 COVID-19: Secondary | ICD-10-CM | POA: Diagnosis not present

## 2021-07-21 DIAGNOSIS — F250 Schizoaffective disorder, bipolar type: Secondary | ICD-10-CM | POA: Diagnosis not present

## 2021-07-21 DIAGNOSIS — F411 Generalized anxiety disorder: Secondary | ICD-10-CM | POA: Diagnosis not present

## 2021-07-21 DIAGNOSIS — F5101 Primary insomnia: Secondary | ICD-10-CM | POA: Diagnosis not present

## 2021-07-21 MED ORDER — MOLNUPIRAVIR EUA 200MG CAPSULE: 4.0000 | ORAL_CAPSULE | Freq: Two times a day (BID) | ORAL | 0 refills | Status: AC

## 2021-07-21 MED ORDER — BENZONATATE 100 MG PO CAPS: 100.0000 mg | ORAL_CAPSULE | Freq: Three times a day (TID) | ORAL | 0 refills | Status: DC | PRN

## 2021-07-21 NOTE — Patient Instructions (Addendum)
Karen Brennan, thank you for joining Leeanne Rio, PA-C for today's virtual visit.  While this provider is not your primary care provider (PCP), if your PCP is located in our provider database this encounter information will be shared with them immediately following your visit.  Consent: (Patient) Karen Brennan provided verbal consent for this virtual visit at the beginning of the encounter.  Current Medications:  Current Outpatient Medications:    Accu-Chek Softclix Lancets lancets, Use as instructed, Disp: 100 each, Rfl: 12   acetaminophen (TYLENOL) 500 MG tablet, Take 1,000 mg by mouth every 6 (six) hours as needed for mild pain., Disp: , Rfl:    albuterol (VENTOLIN HFA) 108 (90 Base) MCG/ACT inhaler, Inhale 2 puffs into the lungs every 4 (four) hours as needed for wheezing or shortness of breath., Disp: 18 g, Rfl: 0   benztropine (COGENTIN) 1 MG tablet, Take 1 mg by mouth 2 (two) times daily., Disp: , Rfl:    blood glucose meter kit and supplies KIT, Dispense based on patient and insurance preference. Use up to four times daily as directed. (FOR ICD-9 250.00, 250.01)., Disp: 1 each, Rfl: 0   Blood Glucose Monitoring Suppl (ACCU-CHEK GUIDE) w/Device KIT, Check blood glucose in AM while fasting, Disp: 1 kit, Rfl: 0   cetirizine (ZYRTEC) 10 MG tablet, TAKE 1 TABLET BY MOUTH EVERY DAY, Disp: 30 tablet, Rfl: 11   clotrimazole (LOTRIMIN) 1 % cream, APPLY TO AFFECTED AREA TWICE A DAY, Disp: 30 g, Rfl: 0   FLUoxetine (PROZAC) 40 MG capsule, Take 40 mg by mouth daily., Disp: , Rfl:    fluticasone (FLONASE) 50 MCG/ACT nasal spray, INSTILL 1 SPRAY INTO BOTH NOSTRILS DAILY, Disp: 16 mL, Rfl: 1   gabapentin (NEURONTIN) 300 MG capsule, TAKE 1 CAPSULE BY MOUTH THREE TIMES A DAY, Disp: 90 capsule, Rfl: 3   glucose blood (ACCU-CHEK GUIDE) test strip, 1 each by Other route See admin instructions. Use as instructed, Disp: 100 strip, Rfl: 12   guaiFENesin (MUCINEX) 600 MG 12 hr tablet, Take by mouth 2  (two) times daily., Disp: , Rfl:    guaifenesin (ROBITUSSIN) 100 MG/5ML syrup, Take 200 mg by mouth 3 (three) times daily as needed for cough., Disp: , Rfl:    haloperidol (HALDOL) 2 MG tablet, Take 2 mg by mouth daily., Disp: , Rfl:    hydrochlorothiazide (HYDRODIURIL) 25 MG tablet, TAKE 1 TABLET (25 MG TOTAL) BY MOUTH DAILY., Disp: 90 tablet, Rfl: 0   hydrOXYzine (VISTARIL) 25 MG capsule, Take 25 mg by mouth every evening. Every evening as needed, Disp: , Rfl:    Lancet Devices (ACCU-CHEK SOFTCLIX) lancets, Use as instructed, Disp: 1 each, Rfl: 0   metFORMIN (GLUCOPHAGE) 1000 MG tablet, Take 1 tablet (1,000 mg total) by mouth 2 (two) times daily with a meal., Disp: 60 tablet, Rfl: 0   Multiple Vitamin (MULTIVITAMIN WITH MINERALS) TABS tablet, Take 1 tablet by mouth daily., Disp: , Rfl:    OLANZapine (ZYPREXA) 20 MG tablet, Take 20 mg by mouth at bedtime. , Disp: , Rfl:    polyethylene glycol powder (GLYCOLAX/MIRALAX) 17 GM/SCOOP powder, Take 17 g by mouth 2 (two) times daily as needed. Then daily as needed constipation, Disp: 116 g, Rfl: 0   Probiotic Product (ACIDOPHILUS PROBIOTIC BLEND PO), Take by mouth., Disp: , Rfl:    traZODone (DESYREL) 50 MG tablet, Take 50 mg by mouth at bedtime as needed for sleep., Disp: , Rfl:    valsartan (DIOVAN) 80 MG tablet,  Take 1 tablet (80 mg total) by mouth at bedtime., Disp: 90 tablet, Rfl: 3   Vitamin D, Ergocalciferol, (DRISDOL) 1.25 MG (50000 UNIT) CAPS capsule, Take 1 capsule (50,000 Units total) by mouth every 7 (seven) days., Disp: 4 capsule, Rfl: 0   Medications ordered in this encounter:  No orders of the defined types were placed in this encounter.    *If you need refills on other medications prior to your next appointment, please contact your pharmacy*  Follow-Up: Call back or seek an in-person evaluation if the symptoms worsen or if the condition fails to improve as anticipated.  Other Instructions Please keep well-hydrated and get plenty of  rest. Start a saline nasal rinse to flush out your nasal passages. You can use plain Mucinex or Coricidin HBP to help thin congestion. If you have a humidifier, running in the bedroom at night. I want you to start OTC vitamin C 1000 mg daily, and a zinc supplement. Please take prescribed medications as directed.  You have been enrolled in a MyChart symptom monitoring program. Please answer these questions daily so we can keep track of how you are doing.  You were to quarantine for 5 days from onset of your symptoms.  After day 5, if you have had no fever and you are feeling better, you can end quarantine but need to mask for an additional 5 days. After day 5 if you have a fever or are having significant symptoms, please quarantine for full 10 days.  If you note any worsening of symptoms, any significant shortness of breath or any chest pain, please seek ER evaluation ASAP.  Please do not delay care!  COVID-19: What to Do if You Are Sick If you test positive and are an older adult or someone who is at high risk of getting very sick from COVID-19, treatment may be available. Contact a healthcare provider right away after a positive test to determine if you are eligible, even if your symptoms are mild right now. You can also visit a Test to Treat location and, if eligible, receive a prescription from a provider. Don't delay: Treatment must be started within the first few days to be effective. If you have a fever, cough, or other symptoms, you might have COVID-19. Most people have mild illness and are able to recover at home. If you are sick: Keep track of your symptoms. If you have an emergency warning sign (including trouble breathing), call 911. Steps to help prevent the spread of COVID-19 if you are sick If you are sick with COVID-19 or think you might have COVID-19, follow the steps below to care for yourself and to help protect other people in your home and community. Stay home except to get  medical care Stay home. Most people with COVID-19 have mild illness and can recover at home without medical care. Do not leave your home, except to get medical care. Do not visit public areas and do not go to places where you are unable to wear a mask. Take care of yourself. Get rest and stay hydrated. Take over-the-counter medicines, such as acetaminophen, to help you feel better. Stay in touch with your doctor. Call before you get medical care. Be sure to get care if you have trouble breathing, or have any other emergency warning signs, or if you think it is an emergency. Avoid public transportation, ride-sharing, or taxis if possible. Get tested If you have symptoms of COVID-19, get tested. While waiting for test results, stay  away from others, including staying apart from those living in your household. Get tested as soon as possible after your symptoms start. Treatments may be available for people with COVID-19 who are at risk for becoming very sick. Don't delay: Treatment must be started early to be effective--some treatments must begin within 5 days of your first symptoms. Contact your healthcare provider right away if your test result is positive to determine if you are eligible. Self-tests are one of several options for testing for the virus that causes COVID-19 and may be more convenient than laboratory-based tests and point-of-care tests. Ask your healthcare provider or your local health department if you need help interpreting your test results. You can visit your state, tribal, local, and territorial health department's website to look for the latest local information on testing sites. Separate yourself from other people As much as possible, stay in a specific room and away from other people and pets in your home. If possible, you should use a separate bathroom. If you need to be around other people or animals in or outside of the home, wear a well-fitting mask. Tell your close contacts that  they may have been exposed to COVID-19. An infected person can spread COVID-19 starting 48 hours (or 2 days) before the person has any symptoms or tests positive. By letting your close contacts know they may have been exposed to COVID-19, you are helping to protect everyone. See COVID-19 and Animals if you have questions about pets. If you are diagnosed with COVID-19, someone from the health department may call you. Answer the call to slow the spread. Monitor your symptoms Symptoms of COVID-19 include fever, cough, or other symptoms. Follow care instructions from your healthcare provider and local health department. Your local health authorities may give instructions on checking your symptoms and reporting information. When to seek emergency medical attention Look for emergency warning signs* for COVID-19. If someone is showing any of these signs, seek emergency medical care immediately: Trouble breathing Persistent pain or pressure in the chest New confusion Inability to wake or stay awake Pale, gray, or blue-colored skin, lips, or nail beds, depending on skin tone *This list is not all possible symptoms. Please call your medical provider for any other symptoms that are severe or concerning to you. Call 911 or call ahead to your local emergency facility: Notify the operator that you are seeking care for someone who has or may have COVID-19. Call ahead before visiting your doctor Call ahead. Many medical visits for routine care are being postponed or done by phone or telemedicine. If you have a medical appointment that cannot be postponed, call your doctor's office, and tell them you have or may have COVID-19. This will help the office protect themselves and other patients. If you are sick, wear a well-fitting mask You should wear a mask if you must be around other people or animals, including pets (even at home). Wear a mask with the best fit, protection, and comfort for you. You don't need to  wear the mask if you are alone. If you can't put on a mask (because of trouble breathing, for example), cover your coughs and sneezes in some other way. Try to stay at least 6 feet away from other people. This will help protect the people around you. Masks should not be placed on young children under age 27 years, anyone who has trouble breathing, or anyone who is not able to remove the mask without help. Cover your coughs and sneezes  Cover your mouth and nose with a tissue when you cough or sneeze. Throw away used tissues in a lined trash can. Immediately wash your hands with soap and water for at least 20 seconds. If soap and water are not available, clean your hands with an alcohol-based hand sanitizer that contains at least 60% alcohol. Clean your hands often Wash your hands often with soap and water for at least 20 seconds. This is especially important after blowing your nose, coughing, or sneezing; going to the bathroom; and before eating or preparing food. Use hand sanitizer if soap and water are not available. Use an alcohol-based hand sanitizer with at least 60% alcohol, covering all surfaces of your hands and rubbing them together until they feel dry. Soap and water are the best option, especially if hands are visibly dirty. Avoid touching your eyes, nose, and mouth with unwashed hands. Handwashing Tips Avoid sharing personal household items Do not share dishes, drinking glasses, cups, eating utensils, towels, or bedding with other people in your home. Wash these items thoroughly after using them with soap and water or put in the dishwasher. Clean surfaces in your home regularly Clean and disinfect high-touch surfaces (for example, doorknobs, tables, handles, light switches, and countertops) in your "sick room" and bathroom. In shared spaces, you should clean and disinfect surfaces and items after each use by the person who is ill. If you are sick and cannot clean, a caregiver or other  person should only clean and disinfect the area around you (such as your bedroom and bathroom) on an as needed basis. Your caregiver/other person should wait as long as possible (at least several hours) and wear a mask before entering, cleaning, and disinfecting shared spaces that you use. Clean and disinfect areas that may have blood, stool, or body fluids on them. Use household cleaners and disinfectants. Clean visible dirty surfaces with household cleaners containing soap or detergent. Then, use a household disinfectant. Use a product from [employer] List N: Disinfectants for Coronavirus (HQPRF-16). Be sure to follow the instructions on the label to ensure safe and effective use of the product. Many products recommend keeping the surface wet with a disinfectant for a certain period of time (look at "contact time" on the product label). You may also need to wear personal protective equipment, such as gloves, depending on the directions on the product label. Immediately after disinfecting, wash your hands with soap and water for 20 seconds. For completed guidance on cleaning and disinfecting your home, visit Complete Disinfection Guidance. Take steps to improve ventilation at home Improve ventilation (air flow) at home to help prevent from spreading COVID-19 to other people in your household. Clear out COVID-19 virus particles in the air by opening windows, using air filters, and turning on fans in your home. Use this interactive tool to learn how to improve air flow in your home. When you can be around others after being sick with COVID-19 Deciding when you can be around others is different for different situations. Find out when you can safely end home isolation. For any additional questions about your care, contact your healthcare provider or state or local health department. 08/31/2020 Content source: [facility] ([facility]), Division of Viral Diseases This  information is not intended to replace advice given to you by your health care provider. Make sure you discuss any questions you have with your health care provider. Document Revised: 10/14/2020 Document Reviewed: 10/14/2020 Elsevier Patient Education  [place].  If you have been instructed to have an in-person evaluation today at a local Urgent Care facility, please use the link below. It will take you to a list of all of our available [facility], including address, phone number and hours of operation. Please do not delay care.  [facility]  If you or a family member do not have a primary care provider, use the link below to schedule a visit and establish care. When you choose a [facility] primary care physician or advanced practice provider, you gain a long-term partner in health. Find a Primary Care Provider  Learn more about [facility]'s in-office and virtual care options: [facility]

## 2021-07-21 NOTE — Progress Notes (Signed)
Virtual Visit Consent   Karen Brennan, you are scheduled for a virtual visit with a [facility] provider today.     Just as with appointments in the office, your consent must be obtained to participate.  Your consent will be active for this visit and any virtual visit you may have with one of our providers in the next 365 days.     If you have a MyChart account, a copy of this consent can be sent to you electronically.  All virtual visits are billed to your insurance company just like a traditional visit in the office.    As this is a virtual visit, video technology does not allow for your provider to perform a traditional examination.  This may limit your provider's ability to fully assess your condition.  If your provider identifies any concerns that need to be evaluated in person or the need to arrange testing (such as labs, EKG, etc.), we will make arrangements to do so.     Although advances in technology are sophisticated, we cannot ensure that it will always work on either your end or our end.  If the connection with a video visit is poor, the visit may have to be switched to a telephone visit.  With either a video or telephone visit, we are not always able to ensure that we have a secure connection.     I need to obtain your verbal consent now.   Are you willing to proceed with your visit today?    Karen Brennan has provided verbal consent on 07/21/2021 for a virtual visit (video or telephone).   Leeanne Rio, [place]   Date: 07/21/2021 9:09 AM   Virtual Visit via Video Note   I, Leeanne Rio, connected with  Karen Brennan  ([id number], 05-27-1976) on 07/21/21 at  9:30 AM EST by a video-enabled telemedicine application and verified that I am speaking with the correct person using two identifiers.  Location: Patient: Virtual Visit Location Patient: Home Provider: Virtual Visit Location Provider: Home Office   I discussed the limitations of evaluation and management by  telemedicine and the availability of in person appointments. The patient expressed understanding and agreed to proceed.    History of Present Illness: Karen Brennan is a 46 y.o. who identifies as a female who was assigned female at birth, and is being seen today for COVID-19. Notes symptoms starting yesterday with throat pain, headache, cough (productive), fatigue starting yesterday early morning. Daughter sick recently but had not been tested for COVID. Denies SOB or chest pain. Denies GI symptoms. Denies fever so far this morning. Took a home COVID test last night that came back positive. She has history of tachycardia, HTN, BPD, Morbid obesity and DM.    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   Seborrheic dermatitis 12/05/2013   Fatigue 01/06/2013   Anxiety 10/25/2011   Bipolar 1 disorder, mixed ([place]) 09/15/2011   Insomnia 10/21/2010   Allergic rhinitis 08/31/2008   Anemia 08/14/2008   Esophageal reflux 01/24/2008   HYPERTENSION, BENIGN ESSENTIAL 08/17/2006   Sinusitis, chronic 08/17/2006  Morbid obesity with BMI of 50.0-59.9, adult ([place]) 08/09/2006   DEPRESSION, MAJOR, RECURRENT 08/09/2006    Allergies:  Allergies  Allergen Reactions   Sibutramine Hcl Monohydrate Hives   Medications:  Current Outpatient Medications:    benzonatate (TESSALON) 100 MG capsule, Take 1 capsule (100 mg total) by mouth 3 (three) times daily as needed for cough., Disp: 30 capsule, Rfl: 0   molnupiravir EUA (LAGEVRIO) 200 mg CAPS capsule, Take 4 capsules (800 mg total) by mouth 2 (two) times daily for 5 days., Disp: 40  capsule, Rfl: 0   Accu-Chek Softclix Lancets lancets, Use as instructed, Disp: 100 each, Rfl: 12   acetaminophen (TYLENOL) 500 MG tablet, Take 1,000 mg by mouth every 6 (six) hours as needed for mild pain., Disp: , Rfl:    albuterol (VENTOLIN HFA) 108 (90 Base) MCG/ACT inhaler, Inhale 2 puffs into the lungs every 4 (four) hours as needed for wheezing or shortness of breath., Disp: 18 g, Rfl: 0   benztropine (COGENTIN) 1 MG tablet, Take 1 mg by mouth 2 (two) times daily., Disp: , Rfl:    blood glucose meter kit and supplies KIT, Dispense based on patient and insurance preference. Use up to four times daily as directed. (FOR ICD-9 250.00, 250.01)., Disp: 1 each, Rfl: 0   Blood Glucose Monitoring Suppl (ACCU-CHEK GUIDE) w/Device KIT, Check blood glucose in AM while fasting, Disp: 1 kit, Rfl: 0   cetirizine (ZYRTEC) 10 MG tablet, TAKE 1 TABLET BY MOUTH EVERY DAY, Disp: 30 tablet, Rfl: 11   clotrimazole (LOTRIMIN) 1 % cream, APPLY TO AFFECTED AREA TWICE A DAY, Disp: 30 g, Rfl: 0   FLUoxetine (PROZAC) 40 MG capsule, Take 40 mg by mouth daily., Disp: , Rfl:    fluticasone (FLONASE) 50 MCG/ACT nasal spray, INSTILL 1 SPRAY INTO BOTH NOSTRILS DAILY, Disp: 16 mL, Rfl: 1   gabapentin (NEURONTIN) 300 MG capsule, TAKE 1 CAPSULE BY MOUTH THREE TIMES A DAY, Disp: 90 capsule, Rfl: 3   glucose blood (ACCU-CHEK GUIDE) test strip, 1 each by Other route See admin instructions. Use as instructed, Disp: 100 strip, Rfl: 12   guaiFENesin (MUCINEX) 600 MG 12 hr tablet, Take by mouth 2 (two) times daily., Disp: , Rfl:    guaifenesin (ROBITUSSIN) 100 MG/5ML syrup, Take 200 mg by mouth 3 (three) times daily as needed for cough., Disp: , Rfl:    haloperidol (HALDOL) 2 MG tablet, Take 2 mg by mouth daily., Disp: , Rfl:    hydrochlorothiazide (HYDRODIURIL) 25 MG tablet, TAKE 1 TABLET (25 MG TOTAL) BY MOUTH DAILY., Disp: 90 tablet, Rfl: 0   hydrOXYzine (VISTARIL) 25 MG capsule, Take 25 mg by mouth every evening. Every evening as  needed, Disp: , Rfl:    Lancet Devices (ACCU-CHEK SOFTCLIX) lancets, Use as instructed, Disp: 1 each, Rfl: 0   metFORMIN (GLUCOPHAGE) 1000 MG tablet, Take 1 tablet (1,000 mg total) by mouth 2 (two) times daily with a meal., Disp: 60 tablet, Rfl: 0   Multiple Vitamin (MULTIVITAMIN WITH MINERALS) TABS tablet, Take 1 tablet by mouth daily., Disp: , Rfl:    OLANZapine (ZYPREXA) 20 MG tablet, Take 20 mg by mouth at bedtime. , Disp: , Rfl:    polyethylene glycol powder (GLYCOLAX/MIRALAX) 17 GM/SCOOP powder, Take 17 g by mouth 2 (two) times daily as needed. Then daily as needed constipation, Disp: 116 g, Rfl: 0   Probiotic Product (ACIDOPHILUS PROBIOTIC BLEND PO), Take by mouth., Disp: , Rfl:    traZODone (DESYREL) 50 MG tablet,  Take 50 mg by mouth at bedtime as needed for sleep., Disp: , Rfl:    valsartan (DIOVAN) 80 MG tablet, Take 1 tablet (80 mg total) by mouth at bedtime., Disp: 90 tablet, Rfl: 3   Vitamin D, Ergocalciferol, (DRISDOL) 1.25 MG (50000 UNIT) CAPS capsule, Take 1 capsule (50,000 Units total) by mouth every 7 (seven) days., Disp: 4 capsule, Rfl: 0  Observations/Objective: Patient is well-developed, well-nourished in no acute distress.  Resting comfortably at home.  Head is normocephalic, atraumatic.  No labored breathing. Speech is clear and coherent with logical content.  Patient is alert and oriented at baseline.   Assessment and Plan: 1. COVID-19 - molnupiravir EUA (LAGEVRIO) 200 mg CAPS capsule; Take 4 capsules (800 mg total) by mouth 2 (two) times daily for 5 days.  Dispense: 40 capsule; Refill: 0 - benzonatate (TESSALON) 100 MG capsule; Take 1 capsule (100 mg total) by mouth 3 (three) times daily as needed for cough.  Dispense: 30 capsule; Refill: 0 - MyChart COVID-19 home monitoring program; Future  Patient with multiple risk factors for complicated course of illness. Discussed risks/benefits of antiviral medications including most common potential ADRs. Patient voiced  understanding and would like to proceed with antiviral medication. They are candidate for molnupiravir. Rx sent to pharmacy. Supportive measures, OTC medications and vitamin regimen reviewed. Tessalon per orders. Patient has been enrolled in a MyChart COVID symptom monitoring program. Samule Dry reviewed in detail. Strict ER precautions discussed with patient.    Follow Up Instructions: I discussed the assessment and treatment plan with the patient. The patient was provided an opportunity to ask questions and all were answered. The patient agreed with the plan and demonstrated an understanding of the instructions.  A copy of instructions were sent to the patient via MyChart unless otherwise noted below.   The patient was advised to call back or seek an in-person evaluation if the symptoms worsen or if the condition fails to improve as anticipated.  Time:  I spent 10 minutes with the patient via telehealth technology discussing the above problems/concerns.    Leeanne Rio, PA-C

## 2021-07-22 DIAGNOSIS — F5101 Primary insomnia: Secondary | ICD-10-CM | POA: Diagnosis not present

## 2021-07-22 DIAGNOSIS — F411 Generalized anxiety disorder: Secondary | ICD-10-CM | POA: Diagnosis not present

## 2021-07-22 DIAGNOSIS — F250 Schizoaffective disorder, bipolar type: Secondary | ICD-10-CM | POA: Diagnosis not present

## 2021-07-23 ENCOUNTER — Other Ambulatory Visit (INDEPENDENT_AMBULATORY_CARE_PROVIDER_SITE_OTHER): Payer: Self-pay | Admitting: Family Medicine

## 2021-07-23 DIAGNOSIS — F39 Unspecified mood [affective] disorder: Secondary | ICD-10-CM

## 2021-07-25 ENCOUNTER — Encounter (INDEPENDENT_AMBULATORY_CARE_PROVIDER_SITE_OTHER): Payer: Self-pay

## 2021-07-25 DIAGNOSIS — F5101 Primary insomnia: Secondary | ICD-10-CM | POA: Diagnosis not present

## 2021-07-25 DIAGNOSIS — F411 Generalized anxiety disorder: Secondary | ICD-10-CM | POA: Diagnosis not present

## 2021-07-25 DIAGNOSIS — F250 Schizoaffective disorder, bipolar type: Secondary | ICD-10-CM | POA: Diagnosis not present

## 2021-07-25 NOTE — Telephone Encounter (Signed)
Sent pt a mychart message to inquire as to whether she is still taking this medication or not. If pt is taking, she was also asked if she is in need or a refill prior to next appt.

## 2021-07-25 NOTE — Telephone Encounter (Signed)
LOV w/ Dr. O

## 2021-07-26 DIAGNOSIS — F5101 Primary insomnia: Secondary | ICD-10-CM | POA: Diagnosis not present

## 2021-07-26 DIAGNOSIS — F250 Schizoaffective disorder, bipolar type: Secondary | ICD-10-CM | POA: Diagnosis not present

## 2021-07-26 DIAGNOSIS — F411 Generalized anxiety disorder: Secondary | ICD-10-CM | POA: Diagnosis not present

## 2021-07-27 DIAGNOSIS — F5101 Primary insomnia: Secondary | ICD-10-CM | POA: Diagnosis not present

## 2021-07-27 DIAGNOSIS — F411 Generalized anxiety disorder: Secondary | ICD-10-CM | POA: Diagnosis not present

## 2021-07-27 DIAGNOSIS — F250 Schizoaffective disorder, bipolar type: Secondary | ICD-10-CM | POA: Diagnosis not present

## 2021-07-28 ENCOUNTER — Telehealth (INDEPENDENT_AMBULATORY_CARE_PROVIDER_SITE_OTHER): Payer: Self-pay

## 2021-07-28 DIAGNOSIS — F411 Generalized anxiety disorder: Secondary | ICD-10-CM | POA: Diagnosis not present

## 2021-07-28 DIAGNOSIS — F250 Schizoaffective disorder, bipolar type: Secondary | ICD-10-CM | POA: Diagnosis not present

## 2021-07-28 DIAGNOSIS — F5101 Primary insomnia: Secondary | ICD-10-CM | POA: Diagnosis not present

## 2021-07-28 NOTE — Telephone Encounter (Signed)
Sophia from [facility] is following up on a couple of faxes sent over on 2/13 or 2/14 for patient's refills of Metormin & Vit D.  They are requesting the refill request be sent over for patient.  Fax #[phone]  Patient last seen by Dr. Sharee Holster  Thank you

## 2021-07-29 DIAGNOSIS — F411 Generalized anxiety disorder: Secondary | ICD-10-CM | POA: Diagnosis not present

## 2021-07-29 DIAGNOSIS — F250 Schizoaffective disorder, bipolar type: Secondary | ICD-10-CM | POA: Diagnosis not present

## 2021-07-29 DIAGNOSIS — F5101 Primary insomnia: Secondary | ICD-10-CM | POA: Diagnosis not present

## 2021-08-01 DIAGNOSIS — F5101 Primary insomnia: Secondary | ICD-10-CM | POA: Diagnosis not present

## 2021-08-01 DIAGNOSIS — F411 Generalized anxiety disorder: Secondary | ICD-10-CM | POA: Diagnosis not present

## 2021-08-01 DIAGNOSIS — F250 Schizoaffective disorder, bipolar type: Secondary | ICD-10-CM | POA: Diagnosis not present

## 2021-08-01 NOTE — Telephone Encounter (Signed)
Pt's medication will be refilled at upcoming visit on 08/08/21.

## 2021-08-02 ENCOUNTER — Telehealth (INDEPENDENT_AMBULATORY_CARE_PROVIDER_SITE_OTHER): Payer: Self-pay | Admitting: Family Medicine

## 2021-08-02 ENCOUNTER — Telehealth: Payer: Self-pay

## 2021-08-02 DIAGNOSIS — F250 Schizoaffective disorder, bipolar type: Secondary | ICD-10-CM | POA: Diagnosis not present

## 2021-08-02 DIAGNOSIS — F411 Generalized anxiety disorder: Secondary | ICD-10-CM | POA: Diagnosis not present

## 2021-08-02 DIAGNOSIS — F5101 Primary insomnia: Secondary | ICD-10-CM | POA: Diagnosis not present

## 2021-08-02 NOTE — Telephone Encounter (Signed)
Spoke with patient and verified change to My Pharmacy.   This has been changed in her chart for future refills.

## 2021-08-02 NOTE — Telephone Encounter (Signed)
[facility] called to check status of refill request from last week. [phone].

## 2021-08-03 DIAGNOSIS — F250 Schizoaffective disorder, bipolar type: Secondary | ICD-10-CM | POA: Diagnosis not present

## 2021-08-03 DIAGNOSIS — F411 Generalized anxiety disorder: Secondary | ICD-10-CM | POA: Diagnosis not present

## 2021-08-03 DIAGNOSIS — F5101 Primary insomnia: Secondary | ICD-10-CM | POA: Diagnosis not present

## 2021-08-03 NOTE — Procedures (Signed)
Patient Name: Karen Brennan, Karen Brennan Date: 07/19/2021 Gender: Female D.O.B: 1976/05/07 Age (years): 45 Referring Provider: Oswaldo Milian Height (inches): 58 Interpreting Physician: Shelva Majestic MD, ABSM Weight (lbs): 418 RPSGT: Carolin Coy BMI: 64 MRN: [id number] Neck Size: 16.00  CLINICAL INFORMATION Sleep Study Type: NPSG  Indication for sleep study: Diabetes, Fatigue, Hypertension, Morbid Obesity  Epworth Sleepiness Score: 2  SLEEP STUDY TECHNIQUE As per the AASM Manual for the Scoring of Sleep and Associated Events v2.3 (April 2016) with a hypopnea requiring 4% desaturations.  The channels recorded and monitored were frontal, central and occipital EEG, electrooculogram (EOG), submentalis EMG (chin), nasal and oral airflow, thoracic and abdominal wall motion, anterior tibialis EMG, snore microphone, electrocardiogram, and pulse oximetry.  MEDICATIONS acetaminophen (TYLENOL) 500 MG tablet albuterol (VENTOLIN HFA) 108 (90 Base) MCG/ACT inhaler benzonatate (TESSALON) 100 MG capsule benztropine (COGENTIN) 1 MG tablet blood glucose meter kit and supplies KIT Blood Glucose Monitoring Suppl (ACCU-CHEK GUIDE) w/Device KIT cetirizine (ZYRTEC) 10 MG tablet clotrimazole (LOTRIMIN) 1 % cream FLUoxetine (PROZAC) 40 MG capsule fluticasone (FLONASE) 50 MCG/ACT nasal spray gabapentin (NEURONTIN) 300 MG capsule glucose blood (ACCU-CHEK GUIDE) test strip guaiFENesin (MUCINEX) 600 MG 12 hr tablet guaifenesin (ROBITUSSIN) 100 MG/5ML syrup haloperidol (HALDOL) 2 MG tablet hydrochlorothiazide (HYDRODIURIL) 25 MG tablet hydrOXYzine (VISTARIL) 25 MG capsule Lancet Devices (ACCU-CHEK SOFTCLIX) lancets metFORMIN (GLUCOPHAGE) 1000 MG tablet Multiple Vitamin (MULTIVITAMIN WITH MINERALS) TABS tablet OLANZapine (ZYPREXA) 20 MG tablet polyethylene glycol powder (GLYCOLAX/MIRALAX) 17 GM/SCOOP powder Probiotic Product (ACIDOPHILUS PROBIOTIC BLEND PO) traZODone (DESYREL) 50  MG tablet valsartan (DIOVAN) 80 MG tablet Vitamin D, Ergocalciferol, (DRISDOL) 1.25 MG (50000 UNIT) CAPS capsule Medications self-administered by patient taken the night of the study : BENZTROPINE MESYLATE, VALSARTAN, OLANZAPINE, busiprone, HYDROXYZINE, TRAZODONE, METFORMIN, GABAPENTIN, FLUOXETINE, HALOPERIDOL  SLEEP ARCHITECTURE The study was initiated at 10:02:59 PM and ended at 4:48:03 AM.  Sleep onset time was 4.0 minutes and the sleep efficiency was 98.5%%. The total sleep time was 399 minutes.  Stage REM latency was 168.5 minutes.  The patient spent 5.8%% of the night in stage N1 sleep, 74.2%% in stage N2 sleep, 0.0%% in stage N3 and 20.1% in REM.  Alpha intrusion was absent.  Supine sleep was 34.03%.  RESPIRATORY PARAMETERS The overall apnea/hypopnea index (AHI) was 9.2 per hour. Ther respiratory disturbance index (RDI) was 21.8/h. There were 3 total apneas, including 2 obstructive, 0 central and 1 mixed apneas. There were 58 hypopneas and 84 RERAs.  The AHI during Stage REM sleep was 26.3 per hour.  AHI while supine was 17.2 per hour.  The mean oxygen saturation was 94.6%. The minimum SpO2 during sleep was 86.0%.  Soft snoring was noted during this study.  CARDIAC DATA The 2 lead EKG demonstrated sinus rhythm. The mean heart rate was 85.7 beats per minute. Other EKG findings include: PVCs.  LEG MOVEMENT DATA The total PLMS were 0 with a resulting PLMS index of 0.0. Associated arousal with leg movement index was 1.4 .  IMPRESSIONS - Mild obstructive sleep apnea occurred during this study (AHI 9.2/h; RDI 21.8/h); however, sleep apnea was moderate with supine sleep (AHI 17.2/h) and during REM sleep (AHI 26.3/h). - Mild oxygen desaturation to a nadir of 86%. - The patient snored with soft snoring volume. - EKG findings include PVCs. - Clinically significant periodic limb movements did not occur during sleep. No significant associated arousals.  DIAGNOSIS - Obstructive  Sleep Apnea (G47.33) - Nocturnal Hypoxemia (G47.36)  RECOMMENDATIONS - Therapeutic CPAP titration to  determine optimal pressure required to alleviate sleep disordered breathing. Can initiate a trial of Auto-PAP with EPR of 3 at 7 - 18 cm of water. - Effort should be made to optimize nasal and oropharyngeal patency. - Positional therapy avoiding supine position during sleep. - Avoid alcohol, sedatives and other CNS depressants that may worsen sleep apnea and disrupt normal sleep architecture. - Sleep hygiene should be reviewed to assess factors that may improve sleep quality. - Weight management (BMI 54) and regular exercise should be initiated or continued if appropriate.  [Electronically signed] 08/03/2021 02:46 PM  Shelva Majestic MD, [facility], [place], American Board of Sleep Medicine   NPI: [id number] [place] PH: [phone]   FX: [phone] [place]

## 2021-08-04 DIAGNOSIS — F250 Schizoaffective disorder, bipolar type: Secondary | ICD-10-CM | POA: Diagnosis not present

## 2021-08-04 DIAGNOSIS — F411 Generalized anxiety disorder: Secondary | ICD-10-CM | POA: Diagnosis not present

## 2021-08-04 DIAGNOSIS — F5101 Primary insomnia: Secondary | ICD-10-CM | POA: Diagnosis not present

## 2021-08-05 DIAGNOSIS — F411 Generalized anxiety disorder: Secondary | ICD-10-CM | POA: Diagnosis not present

## 2021-08-05 DIAGNOSIS — F250 Schizoaffective disorder, bipolar type: Secondary | ICD-10-CM | POA: Diagnosis not present

## 2021-08-05 DIAGNOSIS — F5101 Primary insomnia: Secondary | ICD-10-CM | POA: Diagnosis not present

## 2021-08-08 ENCOUNTER — Telehealth: Payer: Self-pay | Admitting: *Deleted

## 2021-08-08 ENCOUNTER — Encounter (INDEPENDENT_AMBULATORY_CARE_PROVIDER_SITE_OTHER): Payer: Self-pay | Admitting: Family Medicine

## 2021-08-08 ENCOUNTER — Ambulatory Visit (INDEPENDENT_AMBULATORY_CARE_PROVIDER_SITE_OTHER): Payer: Medicaid Other | Admitting: Family Medicine

## 2021-08-08 ENCOUNTER — Other Ambulatory Visit: Payer: Self-pay

## 2021-08-08 ENCOUNTER — Telehealth: Payer: Self-pay

## 2021-08-08 VITALS — BP 126/77 | HR 109 | Temp 98.1°F | Ht 73.0 in | Wt >= 6400 oz

## 2021-08-08 DIAGNOSIS — F5101 Primary insomnia: Secondary | ICD-10-CM | POA: Diagnosis not present

## 2021-08-08 DIAGNOSIS — Z6841 Body Mass Index (BMI) 40.0 and over, adult: Secondary | ICD-10-CM

## 2021-08-08 DIAGNOSIS — E538 Deficiency of other specified B group vitamins: Secondary | ICD-10-CM | POA: Diagnosis not present

## 2021-08-08 DIAGNOSIS — Z7984 Long term (current) use of oral hypoglycemic drugs: Secondary | ICD-10-CM | POA: Diagnosis not present

## 2021-08-08 DIAGNOSIS — E559 Vitamin D deficiency, unspecified: Secondary | ICD-10-CM

## 2021-08-08 DIAGNOSIS — E1169 Type 2 diabetes mellitus with other specified complication: Secondary | ICD-10-CM

## 2021-08-08 DIAGNOSIS — E1159 Type 2 diabetes mellitus with other circulatory complications: Secondary | ICD-10-CM

## 2021-08-08 DIAGNOSIS — F250 Schizoaffective disorder, bipolar type: Secondary | ICD-10-CM | POA: Diagnosis not present

## 2021-08-08 DIAGNOSIS — Z7689 Persons encountering health services in other specified circumstances: Secondary | ICD-10-CM | POA: Diagnosis not present

## 2021-08-08 DIAGNOSIS — E669 Obesity, unspecified: Secondary | ICD-10-CM | POA: Diagnosis not present

## 2021-08-08 DIAGNOSIS — I152 Hypertension secondary to endocrine disorders: Secondary | ICD-10-CM

## 2021-08-08 DIAGNOSIS — D509 Iron deficiency anemia, unspecified: Secondary | ICD-10-CM | POA: Diagnosis not present

## 2021-08-08 DIAGNOSIS — F411 Generalized anxiety disorder: Secondary | ICD-10-CM | POA: Diagnosis not present

## 2021-08-08 MED ORDER — METFORMIN HCL 1000 MG PO TABS: 1000.0000 mg | ORAL_TABLET | Freq: Two times a day (BID) | ORAL | 0 refills | Status: DC

## 2021-08-08 MED ORDER — VITAMIN D (ERGOCALCIFEROL) 1.25 MG (50000 UNIT) PO CAPS: ORAL_CAPSULE | ORAL | 0 refills | Status: DC

## 2021-08-08 MED ORDER — FERROUS SULFATE 325 (65 FE) MG PO TABS: 325.0000 mg | ORAL_TABLET | Freq: Two times a day (BID) | ORAL | 0 refills | Status: DC

## 2021-08-08 NOTE — Telephone Encounter (Signed)
Patient notified of sleep study results and recommendations. She agrees to proceed with APAP. She has no questions at this time when asked. APAP or has been sent to [facility].

## 2021-08-08 NOTE — Telephone Encounter (Signed)
Patient calls nurse line regarding B12 lab results. Patient was told to reach out to PCP office to discuss receiving vitamin B12 injections.   Please advise.   Talbot Grumbling, RN

## 2021-08-08 NOTE — Telephone Encounter (Signed)
-----   Message from Lennette Bihari, MD sent at 08/03/2021  2:52 PM EST ----- Karen Brennan, please notify pt and initiate Auto-PAP with DME

## 2021-08-09 DIAGNOSIS — F5101 Primary insomnia: Secondary | ICD-10-CM | POA: Diagnosis not present

## 2021-08-09 DIAGNOSIS — F250 Schizoaffective disorder, bipolar type: Secondary | ICD-10-CM | POA: Diagnosis not present

## 2021-08-09 DIAGNOSIS — F411 Generalized anxiety disorder: Secondary | ICD-10-CM | POA: Diagnosis not present

## 2021-08-09 MED ORDER — CYANOCOBALAMIN 2000 MCG PO TABS: 2000.0000 ug | ORAL_TABLET | Freq: Every day | ORAL | 1 refills | Status: DC

## 2021-08-09 NOTE — Progress Notes (Signed)
Chief Complaint:   OBESITY Karen Brennan is here to discuss her progress with her obesity treatment plan along with follow-up of her obesity related diagnoses. Karen Brennan is on the Category 2 Plan and states she is following her eating plan approximately 80% of the time. Karen Brennan states she is walking 10-15 minutes 3 times per week.  Today's visit was #: 9 Starting weight: 443 lbs Starting date: 02/01/2021 Today's weight: 413 lbs Today's date: 08/08/2021 Total lbs lost to date: 30 Total lbs lost since last in-office visit: 16  Interim History: Karen Brennan skipped less foods/meals and decided she wants to lose weight and she followed plan 80% of the time. She did less snacking- baked chips, 40-60 cal popsicles. Pt gained 16 lbs of muscle mass and lost 32 lbs of fat mass.  Subjective:   1. Type 2 diabetes mellitus with other specified complication, without long-term current use of insulin (HCC) Discussed labs with patient today. Pt's A1c went from 6.2 to 5.8. She denies hunger but notes cravings for sweets and "can't control it" mostly after dinner. Upon review, pt is only eating 3-4 oz protein at dinner.  2. Hypertension associated with type 2 diabetes mellitus (HCC) Discussed labs with patient today. Asymptomatic without concerns. Medication: Valsartan, HCTZ  3. B12 deficiency Worsening. Discussed labs with patient today. S/p gastric bypass 2013. Pt is not on supplementation and level is worse than prior.  4. Microcytic anemia Worsening. Discussed labs with patient today. H/o iron deficiency in the past. Pt took iron supplements but not for many years.  5. Vitamin D deficiency Discussed labs with patient today. Pt endorses good compliance with once weekly Ergocalciferol and no side effects.  Assessment/Plan:  No orders of the defined types were placed in this encounter.   Medications Discontinued During This Encounter  Medication Reason   Vitamin D, Ergocalciferol, (DRISDOL) 1.25 MG (50000  UNIT) CAPS capsule Reorder   metFORMIN (GLUCOPHAGE) 1000 MG tablet Reorder     Meds ordered this encounter  Medications   metFORMIN (GLUCOPHAGE) 1000 MG tablet    Sig: Take 1 tablet (1,000 mg total) by mouth 2 (two) times daily with a meal.    Dispense:  60 tablet    Refill:  0   Vitamin D, Ergocalciferol, (DRISDOL) 1.25 MG (50000 UNIT) CAPS capsule    Sig: 1 tab q wed and 1 tab q sun    Dispense:  8 capsule    Refill:  0    30 d supply;  ** OV for RF **   Do not send RF request   ferrous sulfate 325 (65 FE) MG tablet    Sig: Take 1 tablet (325 mg total) by mouth 2 (two) times daily with a meal.    Dispense:  60 tablet    Refill:  0     1. Type 2 diabetes mellitus with other specified complication, without long-term current use of insulin (HCC) Pt will increase protein at dinner and continue on meal plan. Will consider increasing medication dose or additional meds in the future prn. Continue weight loss and prudent nutritional plan. Continue Metformin at same dose.  Refill- metFORMIN (GLUCOPHAGE) 1000 MG tablet; Take 1 tablet (1,000 mg total) by mouth 2 (two) times daily with a meal.  Dispense: 60 tablet; Refill: 0  2. Hypertension associated with type 2 diabetes mellitus (HCC) At goal. Continue meds as written. BP is 126/77 at goal today.  Counseled Karen Brennan on pathophysiology of disease and discussed treatment plan,  which always includes dietary and lifestyle modification as first line.  Lifestyle changes such as following our low salt, heart healthy meal plan and engaging in a regular exercise program discussed  - Avoid buying foods that are: processed, frozen, or prepackaged to avoid excess salt. - Ambulatory blood pressure monitoring encouraged.  Reminded patient that if they ever feel poorly in any way, to check their blood pressure and pulse as well. - We will continue to monitor closely alongside PCP/ specialists.  Pt reminded to also f/up with those individuals as  instructed by them.  - We will continue to monitor symptoms as they relate to the her weight loss journey.  3. B12 deficiency Recommend pt contact PCP regarding need for B12 injections. Due to h/o gastric bypass, pt won't be able to absorb well via GI tract.  4. Microcytic anemia Karen Brennan will start ferrous sulfate 325 BID.  Start- ferrous sulfate 325 (65 FE) MG tablet; Take 1 tablet (325 mg total) by mouth 2 (two) times daily with a meal.  Dispense: 60 tablet; Refill: 0  5. Vitamin D deficiency Not at goal. Too low. Increase Ergocalciferol to twice a week. Counseling done. Recheck labs in 3 months.  Increase & Refill- Vitamin D, Ergocalciferol, (DRISDOL) 1.25 MG (50000 UNIT) CAPS capsule; 1 tab q wed and 1 tab q sun  Dispense: 8 capsule; Refill: 0  6. Obesity with current BMI of 54.5 Karen Brennan is currently in the action stage of change. As such, her goal is to continue with weight loss efforts. She has agreed to the Category 2 Plan.   Exercise goals:  As is and as tolerated.  Behavioral modification strategies: increasing lean protein intake and decreasing simple carbohydrates.  Karen Brennan has agreed to follow-up with our clinic in 3-4 weeks. She was informed of the importance of frequent follow-up visits to maximize her success with intensive lifestyle modifications for her multiple health conditions.   Objective:   Blood pressure 126/77, pulse (!) 109, temperature 98.1 F (36.7 C), height 6\' 1"  (1.854 m), weight (!) 413 lb (187.3 kg), SpO2 100 %. Body mass index is 54.49 kg/m.  General: Cooperative, alert, well developed, in no acute distress. HEENT: Conjunctivae and lids unremarkable. Cardiovascular: Regular rhythm.  Lungs: Normal work of breathing. Neurologic: No focal deficits.   Lab Results  Component Value Date   CREATININE 0.86 06/20/2021   BUN 13 06/20/2021   NA 139 06/20/2021   K 4.8 06/20/2021   CL 101 06/20/2021   CO2 23 06/20/2021   Lab Results  Component Value Date    ALT 14 02/01/2021   AST 17 02/01/2021   ALKPHOS 71 02/01/2021   BILITOT 0.4 02/01/2021   Lab Results  Component Value Date   HGBA1C 5.8 (H) 06/20/2021   HGBA1C 6.7 (H) 02/01/2021   HGBA1C 6.2 02/12/2020   HGBA1C 6.3 07/09/2019   HGBA1C 6.2 03/11/2018   Lab Results  Component Value Date   INSULIN 22.7 02/01/2021   Lab Results  Component Value Date   TSH 1.560 02/01/2021   Lab Results  Component Value Date   CHOL 178 02/01/2021   HDL 56 02/01/2021   LDLCALC 107 (H) 02/01/2021   TRIG 81 02/01/2021   CHOLHDL 3.2 11/11/2020   Lab Results  Component Value Date   VD25OH 22.9 (L) 06/20/2021   VD25OH 13.9 (L) 02/01/2021   Lab Results  Component Value Date   WBC 8.8 06/20/2021   HGB 10.5 (L) 06/20/2021   HCT 34.7 06/20/2021  MCV 78 (L) 06/20/2021   PLT 436 06/20/2021   Lab Results  Component Value Date   IRON 30 06/20/2021   TIBC 444 06/20/2021   FERRITIN 8 (L) 06/20/2021    Attestation Statements:   Reviewed by clinician on day of visit: allergies, medications, problem list, medical history, surgical history, family history, social history, and previous encounter notes.  Time spent on visit including pre-visit chart review and post-visit care and charting was 40 minutes.   Edmund Hilda, CMA, am acting as transcriptionist for [employer], DO.  I have reviewed the above documentation for accuracy and completeness, and I agree with the above. [name], D.O.  The 21st Century Cures Act was signed into law in 2016 which includes the topic of electronic health records.  This provides immediate access to information in MyChart.  This includes consultation notes, operative notes, office notes, lab results and pathology reports.  If you have any questions about what you read please let us know at your next visit so we can discuss your concerns and take corrective action if need be.  We are right here with you.

## 2021-08-09 NOTE — Telephone Encounter (Signed)
Called pt. Will try high dose B12 2000 mcg daily. F/u 1 month, consider injections if not improving. Appt scheduled, orders placed. Pt in agreement.

## 2021-08-10 DIAGNOSIS — F250 Schizoaffective disorder, bipolar type: Secondary | ICD-10-CM | POA: Diagnosis not present

## 2021-08-10 DIAGNOSIS — F411 Generalized anxiety disorder: Secondary | ICD-10-CM | POA: Diagnosis not present

## 2021-08-10 DIAGNOSIS — F5101 Primary insomnia: Secondary | ICD-10-CM | POA: Diagnosis not present

## 2021-08-11 DIAGNOSIS — F5101 Primary insomnia: Secondary | ICD-10-CM | POA: Diagnosis not present

## 2021-08-11 DIAGNOSIS — F411 Generalized anxiety disorder: Secondary | ICD-10-CM | POA: Diagnosis not present

## 2021-08-11 DIAGNOSIS — F250 Schizoaffective disorder, bipolar type: Secondary | ICD-10-CM | POA: Diagnosis not present

## 2021-08-12 DIAGNOSIS — F411 Generalized anxiety disorder: Secondary | ICD-10-CM | POA: Diagnosis not present

## 2021-08-12 DIAGNOSIS — F250 Schizoaffective disorder, bipolar type: Secondary | ICD-10-CM | POA: Diagnosis not present

## 2021-08-12 DIAGNOSIS — F5101 Primary insomnia: Secondary | ICD-10-CM | POA: Diagnosis not present

## 2021-08-15 DIAGNOSIS — F411 Generalized anxiety disorder: Secondary | ICD-10-CM | POA: Diagnosis not present

## 2021-08-15 DIAGNOSIS — F250 Schizoaffective disorder, bipolar type: Secondary | ICD-10-CM | POA: Diagnosis not present

## 2021-08-15 DIAGNOSIS — F5101 Primary insomnia: Secondary | ICD-10-CM | POA: Diagnosis not present

## 2021-08-16 DIAGNOSIS — F5101 Primary insomnia: Secondary | ICD-10-CM | POA: Diagnosis not present

## 2021-08-16 DIAGNOSIS — F250 Schizoaffective disorder, bipolar type: Secondary | ICD-10-CM | POA: Diagnosis not present

## 2021-08-16 DIAGNOSIS — F411 Generalized anxiety disorder: Secondary | ICD-10-CM | POA: Diagnosis not present

## 2021-08-17 DIAGNOSIS — F411 Generalized anxiety disorder: Secondary | ICD-10-CM | POA: Diagnosis not present

## 2021-08-17 DIAGNOSIS — F5101 Primary insomnia: Secondary | ICD-10-CM | POA: Diagnosis not present

## 2021-08-17 DIAGNOSIS — F250 Schizoaffective disorder, bipolar type: Secondary | ICD-10-CM | POA: Diagnosis not present

## 2021-08-18 DIAGNOSIS — F250 Schizoaffective disorder, bipolar type: Secondary | ICD-10-CM | POA: Diagnosis not present

## 2021-08-18 DIAGNOSIS — F5101 Primary insomnia: Secondary | ICD-10-CM | POA: Diagnosis not present

## 2021-08-18 DIAGNOSIS — F411 Generalized anxiety disorder: Secondary | ICD-10-CM | POA: Diagnosis not present

## 2021-08-19 DIAGNOSIS — F250 Schizoaffective disorder, bipolar type: Secondary | ICD-10-CM | POA: Diagnosis not present

## 2021-08-19 DIAGNOSIS — F5101 Primary insomnia: Secondary | ICD-10-CM | POA: Diagnosis not present

## 2021-08-19 DIAGNOSIS — F411 Generalized anxiety disorder: Secondary | ICD-10-CM | POA: Diagnosis not present

## 2021-08-22 DIAGNOSIS — F411 Generalized anxiety disorder: Secondary | ICD-10-CM | POA: Diagnosis not present

## 2021-08-22 DIAGNOSIS — F250 Schizoaffective disorder, bipolar type: Secondary | ICD-10-CM | POA: Diagnosis not present

## 2021-08-22 DIAGNOSIS — F5101 Primary insomnia: Secondary | ICD-10-CM | POA: Diagnosis not present

## 2021-08-23 DIAGNOSIS — F5101 Primary insomnia: Secondary | ICD-10-CM | POA: Diagnosis not present

## 2021-08-23 DIAGNOSIS — F250 Schizoaffective disorder, bipolar type: Secondary | ICD-10-CM | POA: Diagnosis not present

## 2021-08-23 DIAGNOSIS — F411 Generalized anxiety disorder: Secondary | ICD-10-CM | POA: Diagnosis not present

## 2021-08-24 DIAGNOSIS — F5101 Primary insomnia: Secondary | ICD-10-CM | POA: Diagnosis not present

## 2021-08-24 DIAGNOSIS — F250 Schizoaffective disorder, bipolar type: Secondary | ICD-10-CM | POA: Diagnosis not present

## 2021-08-24 DIAGNOSIS — F411 Generalized anxiety disorder: Secondary | ICD-10-CM | POA: Diagnosis not present

## 2021-08-25 DIAGNOSIS — F5101 Primary insomnia: Secondary | ICD-10-CM | POA: Diagnosis not present

## 2021-08-25 DIAGNOSIS — F411 Generalized anxiety disorder: Secondary | ICD-10-CM | POA: Diagnosis not present

## 2021-08-25 DIAGNOSIS — F250 Schizoaffective disorder, bipolar type: Secondary | ICD-10-CM | POA: Diagnosis not present

## 2021-08-26 DIAGNOSIS — F411 Generalized anxiety disorder: Secondary | ICD-10-CM | POA: Diagnosis not present

## 2021-08-26 DIAGNOSIS — F250 Schizoaffective disorder, bipolar type: Secondary | ICD-10-CM | POA: Diagnosis not present

## 2021-08-26 DIAGNOSIS — F5101 Primary insomnia: Secondary | ICD-10-CM | POA: Diagnosis not present

## 2021-08-28 IMAGING — CR DG CHEST 2V
2 series · 2 of 2 positions shown · non-contrast
Comparison: 07/09/2018

CLINICAL DATA: Chest pain radiating to left arm beginning this
morning.

EXAM:
CHEST - 2 VIEW

[chest pa]
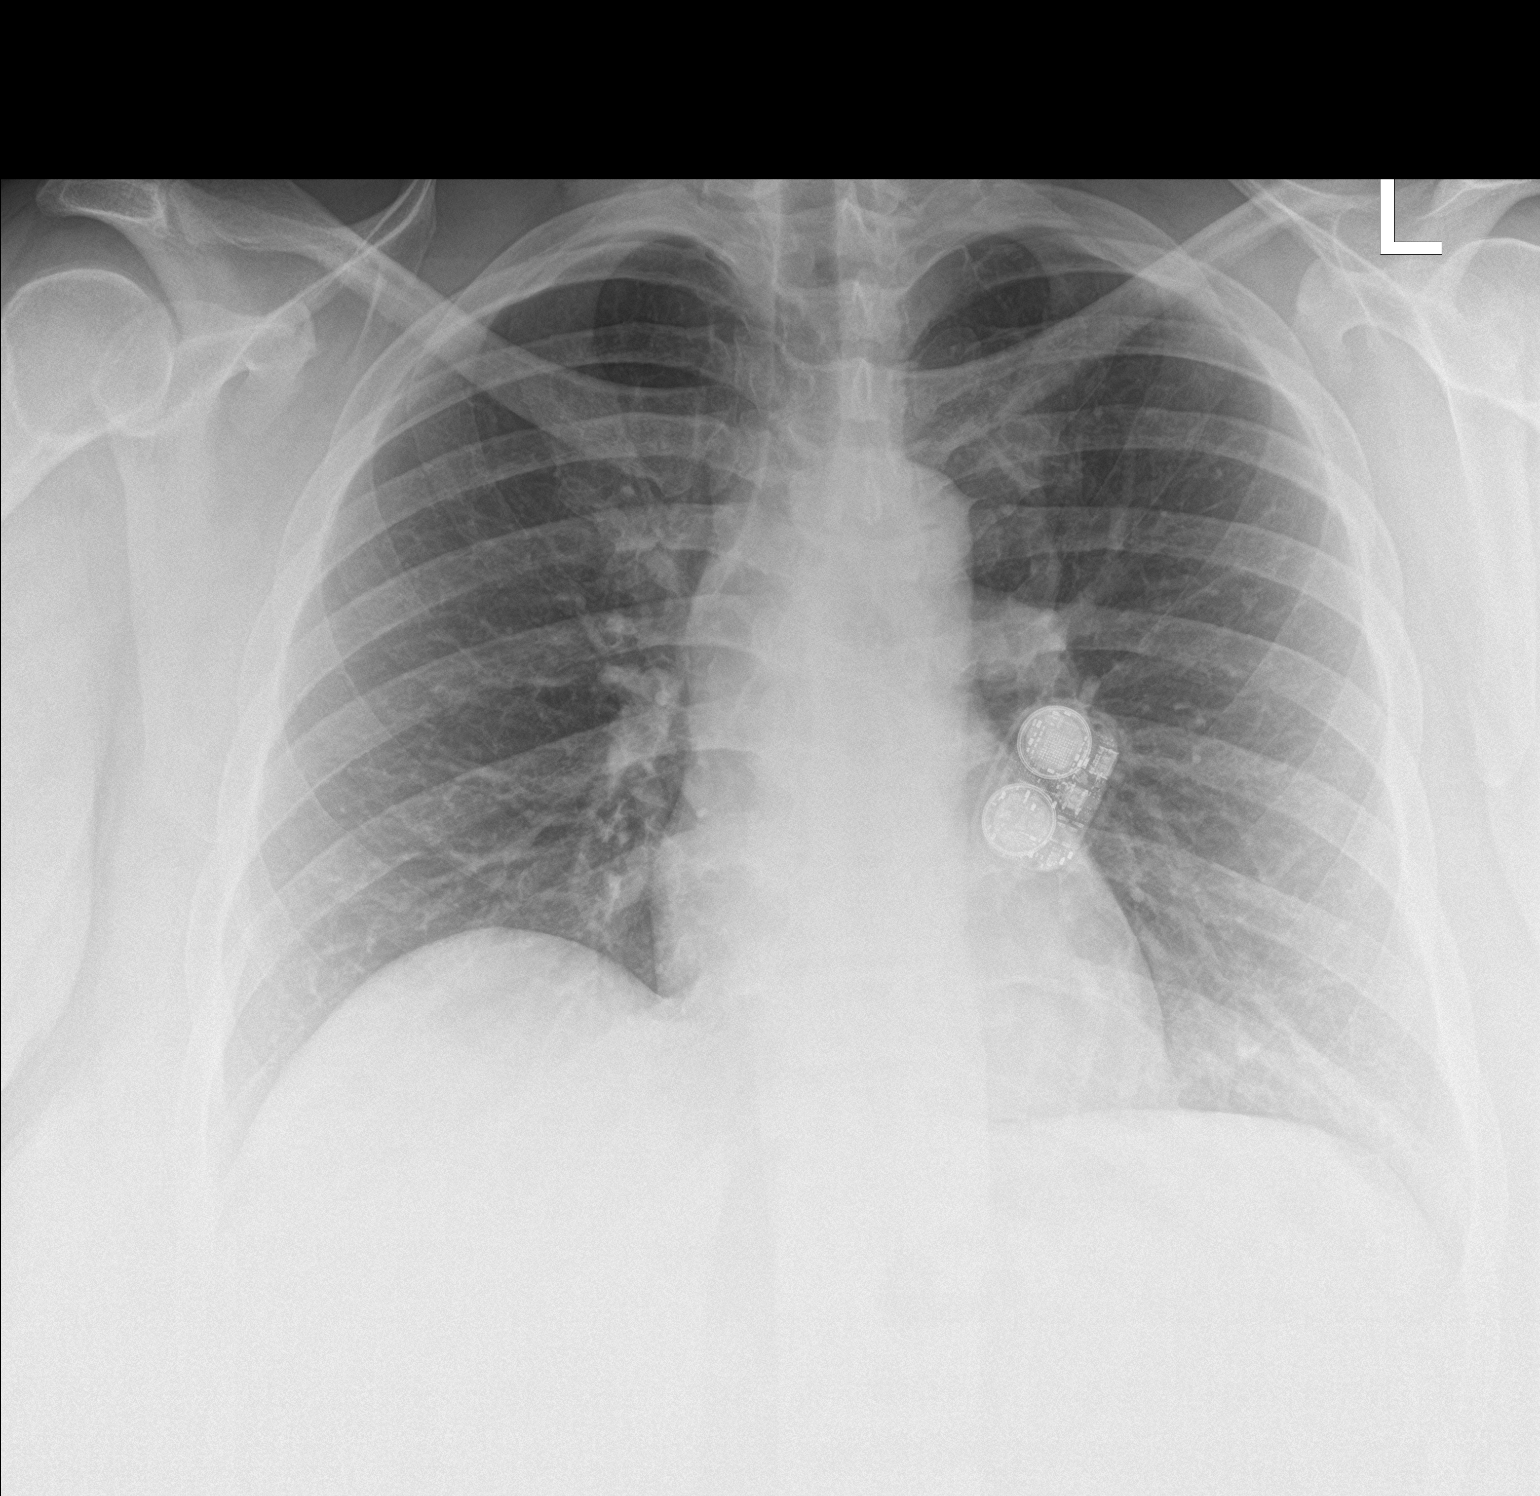

[chest lat]
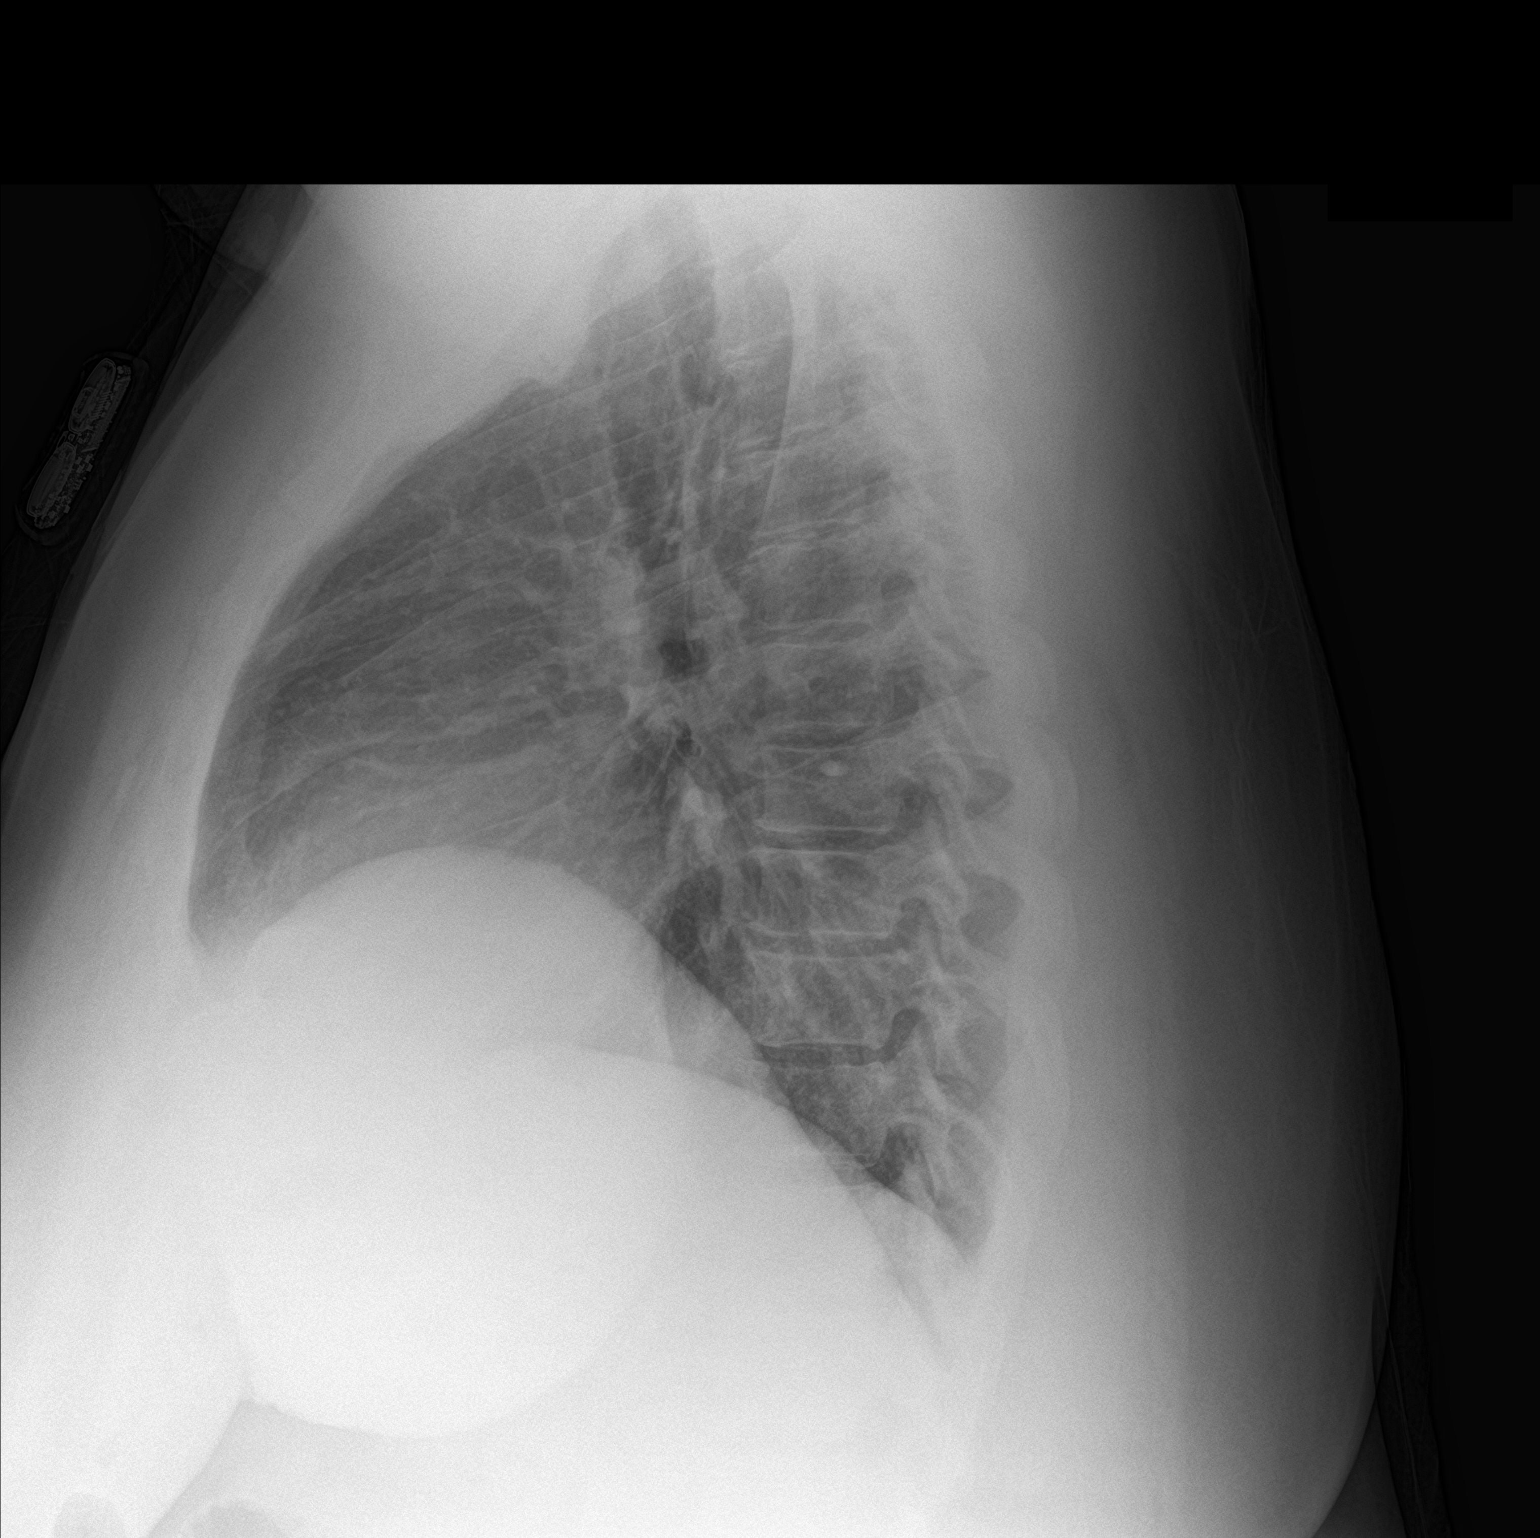

[2 of 2 positions shown; findings below may reference images not displayed]

FINDINGS: The heart size and mediastinal contours are within normal limits.
Both lungs are clear. Electronic device seen in the left anterior
chest wall soft tissues. The visualized skeletal structures are
unremarkable.
IMPRESSION: No active cardiopulmonary disease.

## 2021-08-29 ENCOUNTER — Other Ambulatory Visit: Payer: Self-pay

## 2021-08-29 ENCOUNTER — Encounter (INDEPENDENT_AMBULATORY_CARE_PROVIDER_SITE_OTHER): Payer: Self-pay | Admitting: Family Medicine

## 2021-08-29 ENCOUNTER — Ambulatory Visit (INDEPENDENT_AMBULATORY_CARE_PROVIDER_SITE_OTHER): Payer: Medicaid Other | Admitting: Family Medicine

## 2021-08-29 VITALS — BP 121/83 | HR 100 | Temp 97.5°F | Ht 73.0 in | Wt >= 6400 oz

## 2021-08-29 DIAGNOSIS — E1169 Type 2 diabetes mellitus with other specified complication: Secondary | ICD-10-CM | POA: Diagnosis not present

## 2021-08-29 DIAGNOSIS — E538 Deficiency of other specified B group vitamins: Secondary | ICD-10-CM | POA: Insufficient documentation

## 2021-08-29 DIAGNOSIS — D509 Iron deficiency anemia, unspecified: Secondary | ICD-10-CM

## 2021-08-29 DIAGNOSIS — Z6841 Body Mass Index (BMI) 40.0 and over, adult: Secondary | ICD-10-CM

## 2021-08-29 DIAGNOSIS — E669 Obesity, unspecified: Secondary | ICD-10-CM | POA: Diagnosis not present

## 2021-08-29 DIAGNOSIS — E559 Vitamin D deficiency, unspecified: Secondary | ICD-10-CM | POA: Insufficient documentation

## 2021-08-29 DIAGNOSIS — K5900 Constipation, unspecified: Secondary | ICD-10-CM | POA: Diagnosis not present

## 2021-08-29 DIAGNOSIS — F411 Generalized anxiety disorder: Secondary | ICD-10-CM | POA: Diagnosis not present

## 2021-08-29 DIAGNOSIS — F5101 Primary insomnia: Secondary | ICD-10-CM | POA: Diagnosis not present

## 2021-08-29 DIAGNOSIS — Z9884 Bariatric surgery status: Secondary | ICD-10-CM | POA: Diagnosis not present

## 2021-08-29 DIAGNOSIS — F250 Schizoaffective disorder, bipolar type: Secondary | ICD-10-CM | POA: Diagnosis not present

## 2021-08-29 DIAGNOSIS — E119 Type 2 diabetes mellitus without complications: Secondary | ICD-10-CM | POA: Insufficient documentation

## 2021-08-29 HISTORY — DX: Deficiency of other specified B group vitamins: E53.8

## 2021-08-29 HISTORY — DX: Vitamin D deficiency, unspecified: E55.9

## 2021-08-29 MED ORDER — FERROUS SULFATE 325 (65 FE) MG PO TABS: 325.0000 mg | ORAL_TABLET | Freq: Two times a day (BID) | ORAL | 0 refills | Status: DC

## 2021-08-29 MED ORDER — METFORMIN HCL 1000 MG PO TABS: 1000.0000 mg | ORAL_TABLET | Freq: Two times a day (BID) | ORAL | 0 refills | Status: DC

## 2021-08-29 MED ORDER — VITAMIN D (ERGOCALCIFEROL) 1.25 MG (50000 UNIT) PO CAPS: ORAL_CAPSULE | ORAL | 0 refills | Status: DC

## 2021-08-29 MED ORDER — POLYETHYLENE GLYCOL 3350 17 GM/SCOOP PO POWD: 17.0000 g | Freq: Two times a day (BID) | ORAL | 0 refills | Status: DC | PRN

## 2021-08-30 ENCOUNTER — Telehealth: Payer: Self-pay | Admitting: *Deleted

## 2021-08-30 DIAGNOSIS — F5101 Primary insomnia: Secondary | ICD-10-CM | POA: Diagnosis not present

## 2021-08-30 DIAGNOSIS — F250 Schizoaffective disorder, bipolar type: Secondary | ICD-10-CM | POA: Diagnosis not present

## 2021-08-30 DIAGNOSIS — F411 Generalized anxiety disorder: Secondary | ICD-10-CM | POA: Diagnosis not present

## 2021-08-30 NOTE — Telephone Encounter (Signed)
Patient was @ [facility] yesterday and was told that her body would not be able to absorb b12 tablets because of her gastric bypass. ? ?They recommend that she have injections instead. ? ?Will forward to PCP or orders and then we can call and set her up some appts. ? ?Christen Bame, CMA ? ?

## 2021-08-30 NOTE — Telephone Encounter (Signed)
Thank you. Yes I am aware she will have decreased absorption. I had previously discussed with her doing a trial of higher dose vitamin B12 and she has follow-up scheduled with me next week for vitamin B12 check. Will plan to switch to injections if not responding well. I previously discussed this plan with Dr. Deirdre Priest. ?

## 2021-08-30 NOTE — Telephone Encounter (Signed)
Pt informed and agreeable with plan.  Maui Britten, CMA  

## 2021-08-31 DIAGNOSIS — F5101 Primary insomnia: Secondary | ICD-10-CM | POA: Diagnosis not present

## 2021-08-31 DIAGNOSIS — F250 Schizoaffective disorder, bipolar type: Secondary | ICD-10-CM | POA: Diagnosis not present

## 2021-08-31 DIAGNOSIS — F411 Generalized anxiety disorder: Secondary | ICD-10-CM | POA: Diagnosis not present

## 2021-09-01 DIAGNOSIS — F411 Generalized anxiety disorder: Secondary | ICD-10-CM | POA: Diagnosis not present

## 2021-09-01 DIAGNOSIS — F250 Schizoaffective disorder, bipolar type: Secondary | ICD-10-CM | POA: Diagnosis not present

## 2021-09-01 DIAGNOSIS — F5101 Primary insomnia: Secondary | ICD-10-CM | POA: Diagnosis not present

## 2021-09-02 DIAGNOSIS — F250 Schizoaffective disorder, bipolar type: Secondary | ICD-10-CM | POA: Diagnosis not present

## 2021-09-02 DIAGNOSIS — F5101 Primary insomnia: Secondary | ICD-10-CM | POA: Diagnosis not present

## 2021-09-02 DIAGNOSIS — F411 Generalized anxiety disorder: Secondary | ICD-10-CM | POA: Diagnosis not present

## 2021-09-04 NOTE — Progress Notes (Signed)
? ? ? ?Chief Complaint:  ? ?OBESITY ?Karen Brennan is here to discuss her progress with her obesity treatment plan along with follow-up of her obesity related diagnoses. Karen Brennan is on the Category 2 Plan and states she is following her eating plan approximately 80% of the time. Karen Brennan states she is walking, foot pedal, and weights for 30 minutes 3 times per week. ? ?Today's visit was #: 10 ?Starting weight: 443 lbs ?Starting date: 02/01/2021 ?Today's weight: 408 lbs ?Today's date: 08/29/2021 ?Total lbs lost to date: 35 ?Total lbs lost since last in-office visit: 5 ? ?Interim History: Karen Brennan hasn't been eating all that she is suppose to, not drinking enough water. Just hasn't had time to think about herself as of lately. She is here to review recent labs that were drawn at her last office visit with me. ? ?Subjective:  ? ?1. Type 2 diabetes mellitus with other specified complication, without long-term current use of insulin (HCC) ?Karen Brennan is on metformin. No hunger or cravings. She is not checking her blood sugars as she needs to call her PCP for a new meter and supplies. I discussed labs with the patient today.  ? ?2. History of Roux-en-Y gastric bypass ?Karen Brennan had Roux-en-y procedure years ago. She will be unable to orally absorb some nutrients, which I counseled her on today. I discussed labs with the patient today. ? ?3. Microcytic anemia- iron def d/t Gastric Bypass ?Karen Brennan feels very tired lately, worsening. She is only taking Fe q daily. I discussed labs with the patient today. ? ?4. Constipation, unspecified constipation type ?Karen Brennan's symptoms comes and goes, especially if she is not eating well or drinking enough water. No concerns today, but she needs a refill of miralax, and she notes it works well. ? ?5. B12 deficiency ?Karen Brennan is tired all the time. She is not on B12 supplementation. She is with a history of gastric bypass years ago, Roux. I discussed labs with the patient today. ? ?6. Vitamin D deficiency ?Karen Brennan notes  fatigue. She is on Vit D and her compliance is good. She has no issues. I discussed labs with the patient today. ? ?Assessment/Plan:  ?No orders of the defined types were placed in this encounter. ? ? ?Medications Discontinued During This Encounter  ?Medication Reason  ? polyethylene glycol powder (GLYCOLAX/MIRALAX) 17 GM/SCOOP powder Reorder  ? metFORMIN (GLUCOPHAGE) 1000 MG tablet Reorder  ? Vitamin D, Ergocalciferol, (DRISDOL) 1.25 MG (50000 UNIT) CAPS capsule Reorder  ? ferrous sulfate 325 (65 FE) MG tablet Reorder  ?  ? ?Meds ordered this encounter  ?Medications  ? metFORMIN (GLUCOPHAGE) 1000 MG tablet  ?  Sig: Take 1 tablet (1,000 mg total) by mouth 2 (two) times daily with a meal.  ?  Dispense:  60 tablet  ?  Refill:  0  ? Vitamin D, Ergocalciferol, (DRISDOL) 1.25 MG (50000 UNIT) CAPS capsule  ?  Sig: 1 tab q wed and 1 tab q sun  ?  Dispense:  8 capsule  ?  Refill:  0  ?  30 d supply;  ** OV for RF **   Do not send RF request  ? ferrous sulfate 325 (65 FE) MG tablet  ?  Sig: Take 1 tablet (325 mg total) by mouth 2 (two) times daily with a meal.  ?  Dispense:  60 tablet  ?  Refill:  0  ? polyethylene glycol powder (GLYCOLAX/MIRALAX) 17 GM/SCOOP powder  ?  Sig: Take 17 g by mouth 2 (two) times daily as  needed. Then daily as needed constipation  ?  Dispense:  116 g  ?  Refill:  0  ?  30 d supply;  ** OV for RF **   Do not send RF request  ?  ? ?1. Type 2 diabetes mellitus with other specified complication, without long-term current use of insulin (HCC) ?A1c went from 6.7 down to 5.8 now. We will refill metformin for 1 month. Karen Brennan is to decrease simple carbs and increase protein and fiber. She will continue with weight loss.  ?- metFORMIN (GLUCOPHAGE) 1000 MG tablet; Take 1 tablet (1,000 mg total) by mouth 2 (two) times daily with a meal.  Dispense: 60 tablet; Refill: 0 ? ?2. History of Roux-en-Y gastric bypass ?Karen Brennan is anemic, she has Fe deficiency and B12 deficiency. Used Fe supplementation in the past which  apparently helped; B12 supplement no help.  ? ?3. Microcytic anemia- iron def d/t Gastric Bypass ?Low ferritin stores and Fe saturation levels. Karen Brennan is to increase Fe intake to BID. Counseling was done and she is to increase iron rich food as well. ? ?- ferrous sulfate 325 (65 FE) MG tablet; Take 1 tablet (325 mg total) by mouth 2 (two) times daily with a meal.  Dispense: 60 tablet; Refill: 0 ? ?4. Constipation, unspecified constipation type ?We will refill miralax, and we discussed healthy bowel habits and regimen. Karen Brennan will increase her fiber intake to help increase her bowel movement. ? ?- polyethylene glycol powder (GLYCOLAX/MIRALAX) 17 GM/SCOOP powder; Take 17 g by mouth 2 (two) times daily as needed. Then daily as needed constipation  Dispense: 116 g; Refill: 0 ? ?5. B12 deficiency ?I told Karen Brennan to contact her PCP for B12 injections, as she is unable to absorb B12 orally due to her surgical history.  ? ?6. Vitamin D deficiency ?Karen Brennan's Vit D level is still too low, not at goal. We will refill Ergo twice weekly for 1 month. She will continue her multivitamins, continue weight loss and exercise outside. ? ?- Vitamin D, Ergocalciferol, (DRISDOL) 1.25 MG (50000 UNIT) CAPS capsule; 1 tab q wed and 1 tab q sun  Dispense: 8 capsule; Refill: 0 ? ?7. Obesity with current BMI of 53.9 ?Karen Brennan is currently in the action stage of change. As such, her goal is to continue with weight loss efforts. She has agreed to the Category 2 Plan.  ? ?Exercise goals: For substantial health benefits, adults should do at least 150 minutes (2 hours and 30 minutes) a week of moderate-intensity, or 75 minutes (1 hour and 15 minutes) a week of vigorous-intensity aerobic physical activity, or an equivalent combination of moderate- and vigorous-intensity aerobic activity. Aerobic activity should be performed in episodes of at least 10 minutes, and preferably, it should be spread throughout the week. Increase as tolerated. ? ?Behavioral  modification strategies: increasing lean protein intake, increasing water intake, no skipping meals, keeping healthy foods in the home, and planning for success. ? ?Paisly has agreed to follow-up with our clinic in 3 weeks. She was informed of the importance of frequent follow-up visits to maximize her success with intensive lifestyle modifications for her multiple health conditions.  ? ?Objective:  ? ?Blood pressure 121/83, pulse 100, temperature (!) 97.5 ?F (36.4 ?C), height 6\' 1"  (1.854 m), weight (!) 408 lb (185.1 kg), SpO2 98 %. ?Body mass index is 53.83 kg/m?. ? ?General: Cooperative, alert, well developed, in no acute distress. ?HEENT: Conjunctivae and lids unremarkable. ?Cardiovascular: Regular rhythm.  ?Lungs: Normal work of breathing. ?Neurologic: No  focal deficits.  ? ?Lab Results  ?Component Value Date  ? CREATININE 0.86 06/20/2021  ? BUN 13 06/20/2021  ? NA 139 06/20/2021  ? K 4.8 06/20/2021  ? CL 101 06/20/2021  ? CO2 23 06/20/2021  ? ?Lab Results  ?Component Value Date  ? ALT 14 02/01/2021  ? AST 17 02/01/2021  ? ALKPHOS 71 02/01/2021  ? BILITOT 0.4 02/01/2021  ? ?Lab Results  ?Component Value Date  ? HGBA1C 5.8 (H) 06/20/2021  ? HGBA1C 6.7 (H) 02/01/2021  ? HGBA1C 6.2 02/12/2020  ? HGBA1C 6.3 07/09/2019  ? HGBA1C 6.2 03/11/2018  ? ?Lab Results  ?Component Value Date  ? INSULIN 22.7 02/01/2021  ? ?Lab Results  ?Component Value Date  ? TSH 1.560 02/01/2021  ? ?Lab Results  ?Component Value Date  ? CHOL 178 02/01/2021  ? HDL 56 02/01/2021  ? LDLCALC 107 (H) 02/01/2021  ? TRIG 81 02/01/2021  ? CHOLHDL 3.2 11/11/2020  ? ?Lab Results  ?Component Value Date  ? VD25OH 22.9 (L) 06/20/2021  ? VD25OH 13.9 (L) 02/01/2021  ? ?Lab Results  ?Component Value Date  ? WBC 8.8 06/20/2021  ? HGB 10.5 (L) 06/20/2021  ? HCT 34.7 06/20/2021  ? MCV 78 (L) 06/20/2021  ? PLT 436 06/20/2021  ? ?Lab Results  ?Component Value Date  ? IRON 30 06/20/2021  ? TIBC 444 06/20/2021  ? FERRITIN 8 (L) 06/20/2021  ? ?Attestation  Statements:  ? ?Reviewed by clinician on day of visit: allergies, medications, problem list, medical history, surgical history, family history, social history, and previous encounter notes. ? ?Time spent on visit includi

## 2021-09-05 DIAGNOSIS — F5101 Primary insomnia: Secondary | ICD-10-CM | POA: Diagnosis not present

## 2021-09-05 DIAGNOSIS — G4733 Obstructive sleep apnea (adult) (pediatric): Secondary | ICD-10-CM | POA: Diagnosis not present

## 2021-09-05 DIAGNOSIS — F411 Generalized anxiety disorder: Secondary | ICD-10-CM | POA: Diagnosis not present

## 2021-09-05 DIAGNOSIS — F250 Schizoaffective disorder, bipolar type: Secondary | ICD-10-CM | POA: Diagnosis not present

## 2021-09-06 ENCOUNTER — Ambulatory Visit (INDEPENDENT_AMBULATORY_CARE_PROVIDER_SITE_OTHER): Payer: Medicaid Other | Admitting: Family Medicine

## 2021-09-06 ENCOUNTER — Other Ambulatory Visit: Payer: Self-pay

## 2021-09-06 ENCOUNTER — Encounter: Payer: Self-pay | Admitting: Family Medicine

## 2021-09-06 VITALS — BP 141/80 | HR 129 | Wt >= 6400 oz

## 2021-09-06 DIAGNOSIS — E538 Deficiency of other specified B group vitamins: Secondary | ICD-10-CM | POA: Diagnosis not present

## 2021-09-06 DIAGNOSIS — R000 Tachycardia, unspecified: Secondary | ICD-10-CM

## 2021-09-06 DIAGNOSIS — F5101 Primary insomnia: Secondary | ICD-10-CM | POA: Diagnosis not present

## 2021-09-06 DIAGNOSIS — F250 Schizoaffective disorder, bipolar type: Secondary | ICD-10-CM | POA: Diagnosis not present

## 2021-09-06 DIAGNOSIS — E119 Type 2 diabetes mellitus without complications: Secondary | ICD-10-CM | POA: Diagnosis not present

## 2021-09-06 DIAGNOSIS — Z1159 Encounter for screening for other viral diseases: Secondary | ICD-10-CM

## 2021-09-06 DIAGNOSIS — Z7689 Persons encountering health services in other specified circumstances: Secondary | ICD-10-CM | POA: Diagnosis not present

## 2021-09-06 DIAGNOSIS — F411 Generalized anxiety disorder: Secondary | ICD-10-CM | POA: Diagnosis not present

## 2021-09-06 MED ORDER — BLOOD GLUCOSE MONITOR KIT: PACK | 0 refills | Status: DC

## 2021-09-06 NOTE — Patient Instructions (Addendum)
It was nice seeing you today! ? ?I will let you know about your B12 levels and whether we will need to switch to injections. ? ?I will discuss starting a new medication with you if your blood work looks normal. ? ?Stay well, ?Zola Button, MD ?[facility] ?[phone] ? ?-- ? ?Make sure to check out at the front desk before you leave today. ? ?Please arrive at least 15 minutes prior to your scheduled appointments. ? ?If you had blood work today, I will send you a MyChart message or a letter if results are normal. Otherwise, I will give you a call. ? ?If you had a referral placed, they will call you to set up an appointment. Please give [place] a call if you don't hear back in the next 2 weeks. ? ?If you need additional refills before your next appointment, please call your pharmacy first.  ?

## 2021-09-06 NOTE — Progress Notes (Signed)
? ? ?  SUBJECTIVE:  ? ?CHIEF COMPLAINT / HPI:  ?Chief Complaint  ?Patient presents with  ? Follow-up  ?  ?Patient was found to have low vitamin B12 levels at healthy weight and wellness.  She has been on a trial of high-dose oral vitamin B12 supplement due to history of gastric bypass for the past month.  Here today for follow-up. ? ?She reports having frequent palpitations daily, none currently.  Unsure what BP readings are at home, daughter checks them. Just started CPAP last night, was hard to sleep so she feels this may be contributing. Having dizziness every day when standing up, lasts for a few seconds. Denies chest pain, SOB, leg swelling.  No longer drinks caffeine and denies alcohol use. ? ?PERTINENT  PMH / PSH: PVCs, bipolar disorder, prediabetes, s/p gastric bypass surgery ? ?Patient Care Team: ?Zola Button, MD as PCP - General (Family Medicine)  ? ?OBJECTIVE:  ? ?BP (!) 141/80   Pulse (!) 129   Wt (!) 415 lb (188.2 kg)   SpO2 99%   BMI 54.75 kg/m?   ?Physical Exam ?Constitutional:   ?   General: She is not in acute distress. ?   Appearance: She is obese.  ?HENT:  ?   Head: Normocephalic and atraumatic.  ?Cardiovascular:  ?   Rate and Rhythm: Regular rhythm. Tachycardia present.  ?   Heart sounds: Normal heart sounds. No murmur heard. ?Pulmonary:  ?   Effort: Pulmonary effort is normal. No respiratory distress.  ?   Breath sounds: Normal breath sounds.  ?Musculoskeletal:  ?   Cervical back: Neck supple.  ?   Right lower leg: No edema.  ?   Left lower leg: No edema.  ?Neurological:  ?   Mental Status: She is alert.  ?  ? ? ?  09/06/2021  ?  9:58 AM  ?Depression screen PHQ 2/9  ?Decreased Interest 2  ?Down, Depressed, Hopeless 2  ?PHQ - 2 Score 4  ?Altered sleeping 2  ?Tired, decreased energy 3  ?Change in appetite 2  ?Feeling bad or failure about yourself  1  ?Trouble concentrating 2  ?Moving slowly or fidgety/restless 1  ?Suicidal thoughts 0  ?PHQ-9 Score 15  ?  ?EKG obtained today reveals sinus  tachycardia with HR 132, QTc prolongation ? ?{Show previous vital signs (optional):23777} ? ? ? ?ASSESSMENT/PLAN:  ? ?B12 deficiency ?She has decreased vitamin B12 absorption due to history of gastric bypass surgery currently taking high-dose vitamin B12 oral supplement.  Also on metformin which can cause B12 deficiency.  We will check B12 level today to determine whether she will need to have vitamin B12 injections. ? ?Tachycardia ?Markedly tachycardic in office today.  Patient is asymptomatic, EKG obtained revealed sinus tachycardia.  Follows with cardiology, previous work-up including TTE unremarkable and 2-week event monitoring revealing PVCs. ?- check CBC for anemia ?- check TSH for hyperthyroidism ?- consider starting metoprolol if labs unremarkable ?- advised sooner cardiology follow-up if worsening ?  ?Refill of glucose monitoring kit provided per patient request. ? ?Return in about 4 weeks (around 10/04/2021) for f/u B12, palpitations.  ? ?Zola Button, MD ?[place]  ?

## 2021-09-06 NOTE — Assessment & Plan Note (Signed)
Markedly tachycardic in office today.  Patient is asymptomatic, EKG obtained revealed sinus tachycardia.  Follows with cardiology, previous work-up including TTE unremarkable and 2-week event monitoring revealing PVCs. ?- check CBC for anemia ?- check TSH for hyperthyroidism ?- consider starting metoprolol if labs unremarkable ?- advised sooner cardiology follow-up if worsening ?

## 2021-09-06 NOTE — Assessment & Plan Note (Signed)
She has decreased vitamin B12 absorption due to history of gastric bypass surgery currently taking high-dose vitamin B12 oral supplement.  Also on metformin which can cause B12 deficiency.  We will check B12 level today to determine whether she will need to have vitamin B12 injections. ?

## 2021-09-07 ENCOUNTER — Telehealth: Payer: Self-pay | Admitting: Family Medicine

## 2021-09-07 DIAGNOSIS — F5101 Primary insomnia: Secondary | ICD-10-CM | POA: Diagnosis not present

## 2021-09-07 DIAGNOSIS — F250 Schizoaffective disorder, bipolar type: Secondary | ICD-10-CM | POA: Diagnosis not present

## 2021-09-07 DIAGNOSIS — F411 Generalized anxiety disorder: Secondary | ICD-10-CM | POA: Diagnosis not present

## 2021-09-07 LAB — CBC
Hematocrit: 36.4 % (ref 34.0–46.6)
Hemoglobin: 11.1 g/dL (ref 11.1–15.9)
MCH: 23.9 pg — ABNORMAL LOW (ref 26.6–33.0)
MCHC: 30.5 g/dL — ABNORMAL LOW (ref 31.5–35.7)
MCV: 78 fL — ABNORMAL LOW (ref 79–97)
Platelets: 392 10*3/uL (ref 150–450)
RBC: 4.65 x10E6/uL (ref 3.77–5.28)
RDW: 14.5 % (ref 11.7–15.4)
WBC: 7.7 10*3/uL (ref 3.4–10.8)

## 2021-09-07 LAB — TSH: TSH: 1.09 u[IU]/mL (ref 0.450–4.500)

## 2021-09-07 LAB — HCV AB W REFLEX TO QUANT PCR: HCV Ab: NONREACTIVE

## 2021-09-07 LAB — HCV INTERPRETATION

## 2021-09-07 LAB — VITAMIN B12: Vitamin B-12: 1016 pg/mL (ref 232–1245)

## 2021-09-07 MED ORDER — METOPROLOL TARTRATE 25 MG PO TABS: 12.5000 mg | ORAL_TABLET | Freq: Two times a day (BID) | ORAL | 0 refills | Status: DC

## 2021-09-07 MED ORDER — CYANOCOBALAMIN 1000 MCG PO TABS: 1000.0000 ug | ORAL_TABLET | Freq: Every day | ORAL | 1 refills | Status: DC

## 2021-09-07 MED ORDER — ACCU-CHEK GUIDE W/DEVICE KIT: PACK | 0 refills | Status: DC

## 2021-09-07 NOTE — Telephone Encounter (Signed)
Call patient to review lab results. ? ?Vitamin B12 levels improved significantly now in high normal range.  We will decrease vitamin B12 to 1000 mcg, plan to recheck at follow-up. ? ?No anemia or thyroid dysfunction seen on labs.  We will start metoprolol 12.5 mg twice daily for symptomatic palpitations. ?

## 2021-09-08 DIAGNOSIS — F411 Generalized anxiety disorder: Secondary | ICD-10-CM | POA: Diagnosis not present

## 2021-09-08 DIAGNOSIS — F5101 Primary insomnia: Secondary | ICD-10-CM | POA: Diagnosis not present

## 2021-09-08 DIAGNOSIS — F250 Schizoaffective disorder, bipolar type: Secondary | ICD-10-CM | POA: Diagnosis not present

## 2021-09-09 DIAGNOSIS — F411 Generalized anxiety disorder: Secondary | ICD-10-CM | POA: Diagnosis not present

## 2021-09-09 DIAGNOSIS — F5101 Primary insomnia: Secondary | ICD-10-CM | POA: Diagnosis not present

## 2021-09-09 DIAGNOSIS — F250 Schizoaffective disorder, bipolar type: Secondary | ICD-10-CM | POA: Diagnosis not present

## 2021-09-10 DIAGNOSIS — G4733 Obstructive sleep apnea (adult) (pediatric): Secondary | ICD-10-CM | POA: Diagnosis not present

## 2021-09-12 DIAGNOSIS — F250 Schizoaffective disorder, bipolar type: Secondary | ICD-10-CM | POA: Diagnosis not present

## 2021-09-12 DIAGNOSIS — F411 Generalized anxiety disorder: Secondary | ICD-10-CM | POA: Diagnosis not present

## 2021-09-12 DIAGNOSIS — F5101 Primary insomnia: Secondary | ICD-10-CM | POA: Diagnosis not present

## 2021-09-13 DIAGNOSIS — F250 Schizoaffective disorder, bipolar type: Secondary | ICD-10-CM | POA: Diagnosis not present

## 2021-09-13 DIAGNOSIS — F411 Generalized anxiety disorder: Secondary | ICD-10-CM | POA: Diagnosis not present

## 2021-09-13 DIAGNOSIS — F5101 Primary insomnia: Secondary | ICD-10-CM | POA: Diagnosis not present

## 2021-09-14 DIAGNOSIS — F411 Generalized anxiety disorder: Secondary | ICD-10-CM | POA: Diagnosis not present

## 2021-09-14 DIAGNOSIS — F250 Schizoaffective disorder, bipolar type: Secondary | ICD-10-CM | POA: Diagnosis not present

## 2021-09-14 DIAGNOSIS — F5101 Primary insomnia: Secondary | ICD-10-CM | POA: Diagnosis not present

## 2021-09-15 DIAGNOSIS — F411 Generalized anxiety disorder: Secondary | ICD-10-CM | POA: Diagnosis not present

## 2021-09-15 DIAGNOSIS — F250 Schizoaffective disorder, bipolar type: Secondary | ICD-10-CM | POA: Diagnosis not present

## 2021-09-15 DIAGNOSIS — F5101 Primary insomnia: Secondary | ICD-10-CM | POA: Diagnosis not present

## 2021-09-16 DIAGNOSIS — F250 Schizoaffective disorder, bipolar type: Secondary | ICD-10-CM | POA: Diagnosis not present

## 2021-09-16 DIAGNOSIS — F411 Generalized anxiety disorder: Secondary | ICD-10-CM | POA: Diagnosis not present

## 2021-09-16 DIAGNOSIS — F5101 Primary insomnia: Secondary | ICD-10-CM | POA: Diagnosis not present

## 2021-09-19 DIAGNOSIS — F5101 Primary insomnia: Secondary | ICD-10-CM | POA: Diagnosis not present

## 2021-09-19 DIAGNOSIS — F250 Schizoaffective disorder, bipolar type: Secondary | ICD-10-CM | POA: Diagnosis not present

## 2021-09-19 DIAGNOSIS — F411 Generalized anxiety disorder: Secondary | ICD-10-CM | POA: Diagnosis not present

## 2021-09-20 DIAGNOSIS — F411 Generalized anxiety disorder: Secondary | ICD-10-CM | POA: Diagnosis not present

## 2021-09-20 DIAGNOSIS — F250 Schizoaffective disorder, bipolar type: Secondary | ICD-10-CM | POA: Diagnosis not present

## 2021-09-20 DIAGNOSIS — F5101 Primary insomnia: Secondary | ICD-10-CM | POA: Diagnosis not present

## 2021-09-21 DIAGNOSIS — Z7689 Persons encountering health services in other specified circumstances: Secondary | ICD-10-CM | POA: Diagnosis not present

## 2021-09-22 ENCOUNTER — Ambulatory Visit (INDEPENDENT_AMBULATORY_CARE_PROVIDER_SITE_OTHER): Payer: Medicaid Other | Admitting: Family Medicine

## 2021-09-22 ENCOUNTER — Encounter (INDEPENDENT_AMBULATORY_CARE_PROVIDER_SITE_OTHER): Payer: Self-pay | Admitting: Family Medicine

## 2021-09-22 ENCOUNTER — Other Ambulatory Visit: Payer: Self-pay

## 2021-09-22 VITALS — BP 109/68 | HR 91 | Temp 98.0°F | Ht 73.0 in | Wt >= 6400 oz

## 2021-09-22 DIAGNOSIS — Z6841 Body Mass Index (BMI) 40.0 and over, adult: Secondary | ICD-10-CM | POA: Diagnosis not present

## 2021-09-22 DIAGNOSIS — E1169 Type 2 diabetes mellitus with other specified complication: Secondary | ICD-10-CM | POA: Diagnosis not present

## 2021-09-22 DIAGNOSIS — E669 Obesity, unspecified: Secondary | ICD-10-CM

## 2021-09-22 DIAGNOSIS — Z7984 Long term (current) use of oral hypoglycemic drugs: Secondary | ICD-10-CM | POA: Diagnosis not present

## 2021-09-22 DIAGNOSIS — I10 Essential (primary) hypertension: Secondary | ICD-10-CM

## 2021-09-22 DIAGNOSIS — E559 Vitamin D deficiency, unspecified: Secondary | ICD-10-CM

## 2021-09-22 DIAGNOSIS — F39 Unspecified mood [affective] disorder: Secondary | ICD-10-CM | POA: Diagnosis not present

## 2021-09-22 DIAGNOSIS — Z7689 Persons encountering health services in other specified circumstances: Secondary | ICD-10-CM | POA: Diagnosis not present

## 2021-09-22 MED ORDER — VITAMIN D (ERGOCALCIFEROL) 1.25 MG (50000 UNIT) PO CAPS: ORAL_CAPSULE | ORAL | 0 refills | Status: DC

## 2021-09-22 MED ORDER — HYDROCHLOROTHIAZIDE 25 MG PO TABS: 25.0000 mg | ORAL_TABLET | Freq: Every day | ORAL | 1 refills | Status: DC

## 2021-09-22 MED ORDER — METFORMIN HCL 1000 MG PO TABS: 1000.0000 mg | ORAL_TABLET | Freq: Two times a day (BID) | ORAL | 0 refills | Status: DC

## 2021-09-24 ENCOUNTER — Telehealth: Payer: Medicaid Other | Admitting: Physician Assistant

## 2021-09-24 ENCOUNTER — Encounter: Payer: Self-pay | Admitting: Physician Assistant

## 2021-09-24 DIAGNOSIS — J302 Other seasonal allergic rhinitis: Secondary | ICD-10-CM | POA: Diagnosis not present

## 2021-09-24 MED ORDER — ALBUTEROL SULFATE HFA 108 (90 BASE) MCG/ACT IN AERS: 2.0000 | INHALATION_SPRAY | RESPIRATORY_TRACT | 0 refills | Status: DC | PRN

## 2021-09-24 NOTE — Progress Notes (Signed)
Patient created appointment in error ?

## 2021-09-24 NOTE — Progress Notes (Signed)
?Virtual Visit Consent  ? ?Karen Brennan, you are scheduled for a virtual visit with a [facility] provider today.   ?  ?Just as with appointments in the office, your consent must be obtained to participate.  Your consent will be active for this visit and any virtual visit you may have with one of our providers in the next 365 days.   ?  ?If you have a MyChart account, a copy of this consent can be sent to you electronically.  All virtual visits are billed to your insurance company just like a traditional visit in the office.   ? ?As this is a virtual visit, video technology does not allow for your provider to perform a traditional examination.  This may limit your provider's ability to fully assess your condition.  If your provider identifies any concerns that need to be evaluated in person or the need to arrange testing (such as labs, EKG, etc.), we will make arrangements to do so.   ?  ?Although advances in technology are sophisticated, we cannot ensure that it will always work on either your end or our end.  If the connection with a video visit is poor, the visit may have to be switched to a telephone visit.  With either a video or telephone visit, we are not always able to ensure that we have a secure connection.    ? ?I need to obtain your verbal consent now.   Are you willing to proceed with your visit today?  ?  ?MARILENA TREVATHAN has provided verbal consent on 09/24/2021 for a virtual visit (video or telephone). ?  ?Kennieth Rad, PA-C  ? ?Date: 09/24/2021 6:17 PM ? ? ?Virtual Visit via Video Note  ? ?I, Karen Brennan, connected with  Karen Brennan  ([id number], Nov 10, 1975) on 09/24/21 at  6:00 PM EDT by a video-enabled telemedicine application and verified that I am speaking with the correct person using two identifiers. ? ?Location: ?Patient: Virtual Visit Location Patient: Home ?Provider: Virtual Visit Location Provider: Home Office ?  ?I discussed the limitations of evaluation and management by telemedicine  and the availability of in person appointments. The patient expressed understanding and agreed to proceed.   ? ?History of Present Illness: ?[name] is a 46 y.o. who identifies as a female who was assigned female at birth, and is being seen today for asthma medication. ? ?States that she has previously been prescribed a rescue inhaler, the first time was a year ago during a COVID infection.  States that she has used it intermittently throughout the year.  States that she started having chest tightness last night, states that her inhaler is empty.  States that she has been taking Zyrtec and Benadryl on a daily basis to help her with seasonal allergies. ? ?Denies any other upper respiratory symptoms, denies fever, chills, is eating and drinking okay ? ? ? ?HPI: HPI  ?Problems:  ?Patient Active Problem List  ? Diagnosis Date Noted  ? B12 deficiency 08/29/2021  ? Diabetes mellitus ([place]) 08/29/2021  ? Vitamin D deficiency 08/29/2021  ? Constipation 08/29/2021  ? PVC's (premature ventricular contractions) 07/19/2021  ? Pelvic pain 04/14/2021  ? Back pain 07/08/2020  ? Intertrigo 02/16/2020  ? Tachycardia 07/10/2019  ? Systolic murmur 14/97/0263  ? 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 Prediabetes 06/26/2016  ? Neuropathic pain of both feet 05/09/2016  ? 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 Seborrheic dermatitis 12/05/2013  ?  Fatigue 01/06/2013  ? Anxiety 10/25/2011  ? Bipolar 1 disorder, mixed ([place]) 09/15/2011  ? Insomnia 10/21/2010  ? Allergic rhinitis 08/31/2008  ? Microcytic anemia 08/14/2008  ? Esophageal reflux 01/24/2008  ? HYPERTENSION, BENIGN ESSENTIAL 08/17/2006  ? Sinusitis, chronic 08/17/2006  ? Morbid obesity with BMI of 50.0-59.9, adult ([place]) 08/09/2006  ? DEPRESSION, MAJOR, RECURRENT 08/09/2006  ?  ?Allergies:  ?Allergies  ?Allergen Reactions  ? Sibutramine Hcl Monohydrate Hives  ? ?Medications:  ?Current Outpatient Medications:  ?  Accu-Chek Softclix Lancets  lancets, Use as instructed, Disp: 100 each, Rfl: 12 ?  acetaminophen (TYLENOL) 500 MG tablet, Take 1,000 mg by mouth every 6 (six) hours as needed for mild pain., Disp: , Rfl:  ?  albuterol (VENTOLIN HFA) 108 (90 Base) MCG/ACT inhaler, Inhale 2 puffs into the lungs every 4 (four) hours as needed for wheezing or shortness of breath., Disp: 18 g, Rfl: 0 ?  benzonatate (TESSALON) 100 MG capsule, Take 1 capsule (100 mg total) by mouth 3 (three) times daily as needed for cough., Disp: 30 capsule, Rfl: 0 ?  benztropine (COGENTIN) 1 MG tablet, Take 1 mg by mouth 2 (two) times daily., Disp: , Rfl:  ?  blood glucose meter kit and supplies KIT, Dispense based on patient and insurance preference. Use up to four times daily as directed. (FOR ICD-9 250.00, 250.01)., Disp: 1 each, Rfl: 0 ?  Blood Glucose Monitoring Suppl (ACCU-CHEK GUIDE) w/Device KIT, Check blood glucose in AM while fasting, Disp: 1 kit, Rfl: 0 ?  cetirizine (ZYRTEC) 10 MG tablet, TAKE 1 TABLET BY MOUTH EVERY DAY, Disp: 30 tablet, Rfl: 11 ?  clotrimazole (LOTRIMIN) 1 % cream, APPLY TO AFFECTED AREA TWICE A DAY, Disp: 30 g, Rfl: 0 ?  cyanocobalamin 1000 MCG tablet, Take 1 tablet (1,000 mcg total) by mouth daily., Disp: 30 tablet, Rfl: 1 ?  ferrous sulfate 325 (65 FE) MG tablet, Take 1 tablet (325 mg total) by mouth 2 (two) times daily with a meal., Disp: 60 tablet, Rfl: 0 ?  FLUoxetine (PROZAC) 40 MG capsule, Take 40 mg by mouth daily., Disp: , Rfl:  ?  fluticasone (FLONASE) 50 MCG/ACT nasal spray, INSTILL 1 SPRAY INTO BOTH NOSTRILS DAILY, Disp: 16 mL, Rfl: 1 ?  gabapentin (NEURONTIN) 300 MG capsule, TAKE 1 CAPSULE BY MOUTH THREE TIMES A DAY, Disp: 90 capsule, Rfl: 3 ?  glucose blood (ACCU-CHEK GUIDE) test strip, 1 each by Other route See admin instructions. Use as instructed, Disp: 100 strip, Rfl: 12 ?  guaiFENesin (MUCINEX) 600 MG 12 hr tablet, Take by mouth 2 (two) times daily., Disp: , Rfl:  ?  guaifenesin (ROBITUSSIN) 100 MG/5ML syrup, Take 200 mg by mouth 3  (three) times daily as needed for cough., Disp: , Rfl:  ?  haloperidol (HALDOL) 2 MG tablet, Take 2 mg by mouth daily., Disp: , Rfl:  ?  hydrochlorothiazide (HYDRODIURIL) 25 MG tablet, Take 1 tablet (25 mg total) by mouth daily., Disp: 90 tablet, Rfl: 1 ?  hydrOXYzine (VISTARIL) 25 MG capsule, Take 25 mg by mouth every evening. Every evening as needed, Disp: , Rfl:  ?  Lancet Devices (ACCU-CHEK SOFTCLIX) lancets, Use as instructed, Disp: 1 each, Rfl: 0 ?  metFORMIN (GLUCOPHAGE) 1000 MG tablet, Take 1 tablet (1,000 mg total) by mouth 2 (two) times daily with a meal., Disp: 60 tablet, Rfl: 0 ?  metoprolol tartrate (LOPRESSOR) 25 MG tablet, Take 0.5 tablets (12.5 mg total) by mouth 2 (two) times daily., Disp: 30 tablet, Rfl:  0 ?  Multiple Vitamin (MULTIVITAMIN WITH MINERALS) TABS tablet, Take 1 tablet by mouth daily., Disp: , Rfl:  ?  OLANZapine (ZYPREXA) 20 MG tablet, Take 20 mg by mouth at bedtime. , Disp: , Rfl:  ?  polyethylene glycol powder (GLYCOLAX/MIRALAX) 17 GM/SCOOP powder, Take 17 g by mouth 2 (two) times daily as needed. Then daily as needed constipation, Disp: 116 g, Rfl: 0 ?  Probiotic Product (ACIDOPHILUS PROBIOTIC BLEND PO), Take by mouth., Disp: , Rfl:  ?  traZODone (DESYREL) 50 MG tablet, Take 50 mg by mouth at bedtime as needed for sleep., Disp: , Rfl:  ?  valsartan (DIOVAN) 80 MG tablet, Take 1 tablet (80 mg total) by mouth at bedtime., Disp: 90 tablet, Rfl: 3 ?  Vitamin D, Ergocalciferol, (DRISDOL) 1.25 MG (50000 UNIT) CAPS capsule, 1 tab q wed and 1 tab q sun, Disp: 8 capsule, Rfl: 0 ? ?Observations/Objective: ?Patient is well-developed, well-nourished in no acute distress.  ?Resting comfortably  at home.  ?Head is normocephalic, atraumatic.  ?No labored breathing.  ?Speech is clear and coherent with logical content.  ?Patient is alert and oriented at baseline.  ? ? ?Assessment and Plan: ?1. Seasonal allergies ?- albuterol (VENTOLIN HFA) 108 (90 Base) MCG/ACT inhaler; Inhale 2 puffs into the lungs  every 4 (four) hours as needed for wheezing or shortness of breath.  Dispense: 18 g; Refill: 0 ? ?Patient education given on supportive care.  Patient encouraged to follow-up with primary care prov

## 2021-09-24 NOTE — Patient Instructions (Addendum)
?[name], thank you for joining Kennieth Rad, PA-C for today's virtual visit.  While this provider is not your primary care provider (PCP), if your PCP is located in our provider database this encounter information will be shared with them immediately following your visit. ? ?Consent: ?(Patient) JEIMY BICKERT provided verbal consent for this virtual visit at the beginning of the encounter. ? ?Current Medications: ? ?Current Outpatient Medications:  ?  Accu-Chek Softclix Lancets lancets, Use as instructed, Disp: 100 each, Rfl: 12 ?  acetaminophen (TYLENOL) 500 MG tablet, Take 1,000 mg by mouth every 6 (six) hours as needed for mild pain., Disp: , Rfl:  ?  albuterol (VENTOLIN HFA) 108 (90 Base) MCG/ACT inhaler, Inhale 2 puffs into the lungs every 4 (four) hours as needed for wheezing or shortness of breath., Disp: 18 g, Rfl: 0 ?  benzonatate (TESSALON) 100 MG capsule, Take 1 capsule (100 mg total) by mouth 3 (three) times daily as needed for cough., Disp: 30 capsule, Rfl: 0 ?  benztropine (COGENTIN) 1 MG tablet, Take 1 mg by mouth 2 (two) times daily., Disp: , Rfl:  ?  blood glucose meter kit and supplies KIT, Dispense based on patient and insurance preference. Use up to four times daily as directed. (FOR ICD-9 250.00, 250.01)., Disp: 1 each, Rfl: 0 ?  Blood Glucose Monitoring Suppl (ACCU-CHEK GUIDE) w/Device KIT, Check blood glucose in AM while fasting, Disp: 1 kit, Rfl: 0 ?  cetirizine (ZYRTEC) 10 MG tablet, TAKE 1 TABLET BY MOUTH EVERY DAY, Disp: 30 tablet, Rfl: 11 ?  clotrimazole (LOTRIMIN) 1 % cream, APPLY TO AFFECTED AREA TWICE A DAY, Disp: 30 g, Rfl: 0 ?  cyanocobalamin 1000 MCG tablet, Take 1 tablet (1,000 mcg total) by mouth daily., Disp: 30 tablet, Rfl: 1 ?  ferrous sulfate 325 (65 FE) MG tablet, Take 1 tablet (325 mg total) by mouth 2 (two) times daily with a meal., Disp: 60 tablet, Rfl: 0 ?  FLUoxetine (PROZAC) 40 MG capsule, Take 40 mg by mouth daily., Disp: , Rfl:  ?  fluticasone (FLONASE) 50  MCG/ACT nasal spray, INSTILL 1 SPRAY INTO BOTH NOSTRILS DAILY, Disp: 16 mL, Rfl: 1 ?  gabapentin (NEURONTIN) 300 MG capsule, TAKE 1 CAPSULE BY MOUTH THREE TIMES A DAY, Disp: 90 capsule, Rfl: 3 ?  glucose blood (ACCU-CHEK GUIDE) test strip, 1 each by Other route See admin instructions. Use as instructed, Disp: 100 strip, Rfl: 12 ?  guaiFENesin (MUCINEX) 600 MG 12 hr tablet, Take by mouth 2 (two) times daily., Disp: , Rfl:  ?  guaifenesin (ROBITUSSIN) 100 MG/5ML syrup, Take 200 mg by mouth 3 (three) times daily as needed for cough., Disp: , Rfl:  ?  haloperidol (HALDOL) 2 MG tablet, Take 2 mg by mouth daily., Disp: , Rfl:  ?  hydrochlorothiazide (HYDRODIURIL) 25 MG tablet, Take 1 tablet (25 mg total) by mouth daily., Disp: 90 tablet, Rfl: 1 ?  hydrOXYzine (VISTARIL) 25 MG capsule, Take 25 mg by mouth every evening. Every evening as needed, Disp: , Rfl:  ?  Lancet Devices (ACCU-CHEK SOFTCLIX) lancets, Use as instructed, Disp: 1 each, Rfl: 0 ?  metFORMIN (GLUCOPHAGE) 1000 MG tablet, Take 1 tablet (1,000 mg total) by mouth 2 (two) times daily with a meal., Disp: 60 tablet, Rfl: 0 ?  metoprolol tartrate (LOPRESSOR) 25 MG tablet, Take 0.5 tablets (12.5 mg total) by mouth 2 (two) times daily., Disp: 30 tablet, Rfl: 0 ?  Multiple Vitamin (MULTIVITAMIN WITH MINERALS) TABS tablet, Take  1 tablet by mouth daily., Disp: , Rfl:  ?  OLANZapine (ZYPREXA) 20 MG tablet, Take 20 mg by mouth at bedtime. , Disp: , Rfl:  ?  polyethylene glycol powder (GLYCOLAX/MIRALAX) 17 GM/SCOOP powder, Take 17 g by mouth 2 (two) times daily as needed. Then daily as needed constipation, Disp: 116 g, Rfl: 0 ?  Probiotic Product (ACIDOPHILUS PROBIOTIC BLEND PO), Take by mouth., Disp: , Rfl:  ?  traZODone (DESYREL) 50 MG tablet, Take 50 mg by mouth at bedtime as needed for sleep., Disp: , Rfl:  ?  valsartan (DIOVAN) 80 MG tablet, Take 1 tablet (80 mg total) by mouth at bedtime., Disp: 90 tablet, Rfl: 3 ?  Vitamin D, Ergocalciferol, (DRISDOL) 1.25 MG (50000  UNIT) CAPS capsule, 1 tab q wed and 1 tab q sun, Disp: 8 capsule, Rfl: 0  ? ?Medications ordered in this encounter:  ?Meds ordered this encounter  ?Medications  ? albuterol (VENTOLIN HFA) 108 (90 Base) MCG/ACT inhaler  ?  Sig: Inhale 2 puffs into the lungs every 4 (four) hours as needed for wheezing or shortness of breath.  ?  Dispense:  18 g  ?  Refill:  0  ?  Order Specific Question:   Supervising Provider  ?  Answer:   Noemi Chapel [[id number]]  ?  ? ?*If you need refills on other medications prior to your next appointment, please contact your pharmacy* ? ?Follow-Up: ?Call back or seek an in-person evaluation if the symptoms worsen or if the condition fails to improve as anticipated. ? ?Other Instructions ?Allergies, Adult ?An allergy is a condition in which the body's defense system (immune system) comes in contact with an allergen and reacts to it. An allergen is anything that causes an allergic reaction. Allergens cause the immune system to make proteins for fighting infections (antibodies). These antibodies cause cells to release chemicals called histamines that set off the symptoms of an allergic reaction. ?Allergies often affect the nasal passages (allergic rhinitis), eyes (allergic conjunctivitis), skin (atopic dermatitis), and stomach. Allergies can be mild, moderate, or severe. They cannot spread from person to person. Allergies can develop at any age and may be outgrown. ?What are the causes? ?This condition is caused by allergens. Common allergens include: ?Outdoor allergens, such as pollen, car fumes, and mold. ?Indoor allergens, such as dust, smoke, mold, and pet dander. ?Other allergens, such as foods, medicines, scents, insect bites or stings, and other skin irritants. ?What increases the risk? ?You are more likely to develop this condition if you have: ?Family members with allergies. ?Family members who have any condition that may be caused by allergens, such as asthma. This may make you more likely to  have other allergies. ?What are the signs or symptoms? ?Symptoms of this condition depend on the severity of the allergy. ?Mild to moderate symptoms ?Runny nose, stuffy nose (nasal congestion), or sneezing. ?Itchy mouth, ears, or throat. ?A feeling of mucus dripping down the back of your throat (postnasal drip). ?Sore throat. ?Itchy, red, watery, or puffy eyes. ?Skin rash, or itchy, red, swollen areas of skin (hives). ?Stomach cramps or bloating. ?Severe symptoms ?Severe allergies to food, medicine, or insect bites may cause anaphylaxis, which can be life-threatening. Symptoms include: ?A red (flushed) face. ?Wheezing or coughing. ?Swollen lips, tongue, or mouth. ?Tight or swollen throat. ?Chest pain or tightness, or rapid heartbeat. ?Trouble breathing or shortness of breath. ?Pain in the abdomen, vomiting, or diarrhea. ?Dizziness or fainting. ?How is this diagnosed? ?This condition is diagnosed based on  your symptoms, your family and medical history, and a physical exam. You may also have tests, including: ?Skin tests to see how your skin reacts to allergens that may be causing your symptoms. Tests include: ?Skin prick test. For this test, an allergen is introduced to your body through a small opening in the skin. ?Intradermal skin test. For this test, a small amount of allergen is injected under the first layer of your skin. ?Patch test. For this test, a small amount of allergen is placed on your skin. The area is covered and then checked after a few days. ?Blood tests. ?A challenge test. For this test, you will eat or breathe in a small amount of allergen to see if you have an allergic reaction. ?You may also be asked to: ?Keep a food diary. This is a record of all the foods, drinks, and symptoms you have in a day. ?Try an elimination diet. To do this: ?Remove certain foods from your diet. ?Add those foods back one by one to find out if any foods cause an allergic reaction. ?How is this treated? ? ?  ? ?Treatment  for allergies depends on your symptoms. Treatment may include: ?Cold, wet cloths (cold compresses) to soothe itching and swelling. ?Eye drops or nasal sprays. ?Nasal irrigation to help clear your mu

## 2021-09-24 NOTE — Addendum Note (Signed)
Addended by: Roney Jaffe on: 09/24/2021 06:32 PM ? ? Modules accepted: Orders ? ?

## 2021-10-04 ENCOUNTER — Other Ambulatory Visit: Payer: Self-pay | Admitting: Family Medicine

## 2021-10-05 ENCOUNTER — Ambulatory Visit (INDEPENDENT_AMBULATORY_CARE_PROVIDER_SITE_OTHER): Payer: Medicaid Other | Admitting: Family Medicine

## 2021-10-05 ENCOUNTER — Encounter (INDEPENDENT_AMBULATORY_CARE_PROVIDER_SITE_OTHER): Payer: Self-pay | Admitting: Family Medicine

## 2021-10-05 VITALS — BP 124/84 | HR 83 | Temp 98.7°F | Ht 73.0 in | Wt >= 6400 oz

## 2021-10-05 DIAGNOSIS — Z7689 Persons encountering health services in other specified circumstances: Secondary | ICD-10-CM | POA: Diagnosis not present

## 2021-10-05 DIAGNOSIS — E669 Obesity, unspecified: Secondary | ICD-10-CM | POA: Diagnosis not present

## 2021-10-05 DIAGNOSIS — K5900 Constipation, unspecified: Secondary | ICD-10-CM

## 2021-10-05 DIAGNOSIS — E538 Deficiency of other specified B group vitamins: Secondary | ICD-10-CM | POA: Diagnosis not present

## 2021-10-05 DIAGNOSIS — Z7984 Long term (current) use of oral hypoglycemic drugs: Secondary | ICD-10-CM | POA: Diagnosis not present

## 2021-10-05 DIAGNOSIS — Z6841 Body Mass Index (BMI) 40.0 and over, adult: Secondary | ICD-10-CM | POA: Diagnosis not present

## 2021-10-05 DIAGNOSIS — E559 Vitamin D deficiency, unspecified: Secondary | ICD-10-CM

## 2021-10-05 DIAGNOSIS — E1169 Type 2 diabetes mellitus with other specified complication: Secondary | ICD-10-CM

## 2021-10-05 DIAGNOSIS — K5909 Other constipation: Secondary | ICD-10-CM | POA: Diagnosis not present

## 2021-10-05 DIAGNOSIS — D508 Other iron deficiency anemias: Secondary | ICD-10-CM | POA: Diagnosis not present

## 2021-10-05 MED ORDER — VITAMIN D (ERGOCALCIFEROL) 1.25 MG (50000 UNIT) PO CAPS: ORAL_CAPSULE | ORAL | 0 refills | Status: DC

## 2021-10-05 MED ORDER — POLYETHYLENE GLYCOL 3350 17 GM/SCOOP PO POWD: 17.0000 g | Freq: Two times a day (BID) | ORAL | 0 refills | Status: DC | PRN

## 2021-10-05 MED ORDER — METFORMIN HCL 1000 MG PO TABS: 1000.0000 mg | ORAL_TABLET | Freq: Two times a day (BID) | ORAL | 0 refills | Status: DC

## 2021-10-05 NOTE — Progress Notes (Signed)
? ? ? ?Chief Complaint:  ? ?OBESITY ?Karen Brennan is here to discuss her progress with her obesity treatment plan along with follow-up of her obesity related diagnoses. Karen Brennan is on the Category 2 Plan and states she is following her eating plan approximately 80% of the time. Karen Brennan states she is biking, weights, and walking for 20-30 minutes 3 times per week. ? ?Today's visit Brennan #: 11 ?Starting weight: 443 lbs ?Starting date: 02/01/2021 ?Today's weight: 413 lbs ?Today's date: 09/22/2021 ?Total lbs lost to date: 30 ?Total lbs lost since last in-office visit: 0 ? ?Interim History: Karen Brennan is struggling with depression lately, food scarcity is an issue and Brennan eating more on the plan.  ? ?Subjective:  ? ?1. Type 2 diabetes mellitus with other specified complication, without long-term current use of insulin (HCC) ?Karen Brennan has a diagnosis of of diabetes mellitus II. She continues to work on diet and exercise to decrease her risk of diabetes. She is taking metformin, and denies nausea or hypoglycemia. ? ?2. Vitamin D deficiency ?Karen Brennan is currently taking prescription vitamin D 50,000 IU twice weekly. She denies nausea, vomiting or muscle weakness. ? ?3. Mood disorder (HCC) ?Karen Brennan sees [employer] [facility] for psych medication management. Recently seen and her medications were changed some because she has been more sad, and not eating as better and feeling worse about herself. She denies suicidal Ideations. ? ?Assessment/Plan:  ?No orders of the defined types were placed in this encounter. ? ? ?Medications Discontinued During This Encounter  ?Medication Reason  ? metFORMIN (GLUCOPHAGE) 1000 MG tablet Reorder  ? Vitamin D, Ergocalciferol, (DRISDOL) 1.25 MG (50000 UNIT) CAPS capsule Reorder  ?  ? ?Meds ordered this encounter  ?Medications  ? DISCONTD: metFORMIN (GLUCOPHAGE) 1000 MG tablet  ?  Sig: Take 1 tablet (1,000 mg total) by mouth 2 (two) times daily with a meal.  ?  Dispense:  60 tablet  ?  Refill:  0  ? DISCONTD: Vitamin D,  Ergocalciferol, (DRISDOL) 1.25 MG (50000 UNIT) CAPS capsule  ?  Sig: 1 tab q wed and 1 tab q sun  ?  Dispense:  8 capsule  ?  Refill:  0  ?  30 d supply;  ** OV for RF **   Do not send RF request  ?  ? ?1. Type 2 diabetes mellitus with other specified complication, without long-term current use of insulin (HCC) ?Karen Brennan will continue metformin, and we will refill for 1 month. We will hold off on A1c, CMP, and B12 recheck. ? ?- metFORMIN (GLUCOPHAGE) 1000 MG tablet; Take 1 tablet (1,000 mg total) by mouth 2 (two) times daily with a meal.  Dispense: 60 tablet; Refill: 0 ? ?2. Vitamin D deficiency ?We will refill prescription Vitamin D for 1 month. We will recheck Vit D level at Karen Brennan's next office visit or so. She desires to not check today. ? ?- Vitamin D, Ergocalciferol, (DRISDOL) 1.25 MG (50000 UNIT) CAPS capsule; 1 tab q wed and 1 tab q sun  Dispense: 8 capsule; Refill: 0 ? ?3. Mood disorder (HCC) ?Karen Brennan will continue her medication management per her Psychiatric doctor, and she is to increase her activity. ? ?4. Obesity with current BMI of 54.5 ?Karen Brennan is currently in the action stage of change. As such, her goal is to continue with weight loss efforts. She has agreed to the Category 2 Plan.  ? ?Look into food pantries etc, focus on self-care and follow up closely with psychiatry to ensure she is better emotionally.  ? ?  Exercise goals: Start walking for 30 minutes every other day to help with mood.  ? ?Behavioral modification strategies: increasing lean protein intake and meal planning and cooking strategies. ? ?Karen Brennan has agreed to follow-up with our clinic in 2 weeks. She Brennan informed of the importance of frequent follow-up visits to maximize her success with intensive lifestyle modifications for her multiple health conditions.  ? ?Objective:  ? ?Blood pressure 109/68, pulse 91, temperature 98 ?F (36.7 ?C), height 6\' 1"  (1.854 m), weight (!) 413 lb (187.3 kg), SpO2 100 %. ?Body mass index is 54.49  kg/m?. ? ?General: Cooperative, alert, well developed, in no acute distress. ?HEENT: Conjunctivae and lids unremarkable. ?Cardiovascular: Regular rhythm.  ?Lungs: Normal work of breathing. ?Neurologic: No focal deficits.  ? ?Lab Results  ?Component Value Date  ? CREATININE 0.86 06/20/2021  ? BUN 13 06/20/2021  ? NA 139 06/20/2021  ? K 4.8 06/20/2021  ? CL 101 06/20/2021  ? CO2 23 06/20/2021  ? ?Lab Results  ?Component Value Date  ? ALT 14 02/01/2021  ? AST 17 02/01/2021  ? ALKPHOS 71 02/01/2021  ? BILITOT 0.4 02/01/2021  ? ?Lab Results  ?Component Value Date  ? HGBA1C 5.8 (H) 06/20/2021  ? HGBA1C 6.7 (H) 02/01/2021  ? HGBA1C 6.2 02/12/2020  ? HGBA1C 6.3 07/09/2019  ? HGBA1C 6.2 03/11/2018  ? ?Lab Results  ?Component Value Date  ? INSULIN 22.7 02/01/2021  ? ?Lab Results  ?Component Value Date  ? TSH 1.090 09/06/2021  ? ?Lab Results  ?Component Value Date  ? CHOL 178 02/01/2021  ? HDL 56 02/01/2021  ? LDLCALC 107 (H) 02/01/2021  ? TRIG 81 02/01/2021  ? CHOLHDL 3.2 11/11/2020  ? ?Lab Results  ?Component Value Date  ? VD25OH 22.9 (L) 06/20/2021  ? VD25OH 13.9 (L) 02/01/2021  ? ?Lab Results  ?Component Value Date  ? WBC 7.7 09/06/2021  ? HGB 11.1 09/06/2021  ? HCT 36.4 09/06/2021  ? MCV 78 (L) 09/06/2021  ? PLT 392 09/06/2021  ? ?Lab Results  ?Component Value Date  ? IRON 30 06/20/2021  ? TIBC 444 06/20/2021  ? FERRITIN 8 (L) 06/20/2021  ? ? ?Obesity Behavioral Intervention:  ? ?Approximately 15 minutes were spent on the discussion below. ? ?ASK: ?We discussed the diagnosis of obesity with 08/18/2021 today and Karen Brennan agreed to give Karen Brennan permission to discuss obesity behavioral modification therapy today. ? ?ASSESS: ?Karen Brennan has the diagnosis of obesity and her BMI today is 54.5. Karen Brennan is in the action stage of change.  ? ?ADVISE: ?Karen Brennan educated on the multiple health risks of obesity as well as the benefit of weight loss to improve her health. She Brennan advised of the need for long term treatment and the importance of lifestyle  modifications to improve her current health and to decrease her risk of future health problems. ? ?AGREE: ?Multiple dietary modification options and treatment options were discussed and Carlisia agreed to follow the recommendations documented in the above note. ? ?ARRANGE: ?Karen Brennan Brennan educated on the importance of frequent visits to treat obesity as outlined per CMS and USPSTF guidelines and agreed to schedule her next follow up appointment today. ? ?Attestation Statements:  ? ?Reviewed by clinician on day of visit: allergies, medications, problem list, medical history, surgical history, family history, social history, and previous encounter notes. ? ? ?I, Karen Brennan, am acting as transcriptionist for Burt Knack, DO. ? ?I have reviewed the above documentation for accuracy and completeness, and I agree with the above. -  Marjory Sneddon, D.O. ? ?[place] signed into law in 2016 which includes the topic of electronic health records.  This provides immediate access to information in MyChart.  This includes consultation notes, operative notes, office notes, lab results and pathology reports.  If you have any questions about what you read please let us know at your next visit so we can discuss your concerns and take corrective action if need be.  We are right here with you. ? ? ?

## 2021-10-06 DIAGNOSIS — D649 Anemia, unspecified: Secondary | ICD-10-CM | POA: Insufficient documentation

## 2021-10-06 LAB — COMPREHENSIVE METABOLIC PANEL
ALT: 15 IU/L (ref 0–32)
AST: 15 IU/L (ref 0–40)
Albumin/Globulin Ratio: 1.4 (ref 1.2–2.2)
Albumin: 4.3 g/dL (ref 3.8–4.8)
Alkaline Phosphatase: 68 IU/L (ref 44–121)
BUN/Creatinine Ratio: 11 (ref 9–23)
BUN: 10 mg/dL (ref 6–24)
Bilirubin Total: 0.4 mg/dL (ref 0.0–1.2)
CO2: 24 mmol/L (ref 20–29)
Calcium: 9.6 mg/dL (ref 8.7–10.2)
Chloride: 101 mmol/L (ref 96–106)
Creatinine, Ser: 0.9 mg/dL (ref 0.57–1.00)
Globulin, Total: 3.1 g/dL (ref 1.5–4.5)
Glucose: 99 mg/dL (ref 70–99)
Potassium: 4.6 mmol/L (ref 3.5–5.2)
Sodium: 142 mmol/L (ref 134–144)
Total Protein: 7.4 g/dL (ref 6.0–8.5)
eGFR: 81 mL/min/{1.73_m2} (ref >59)

## 2021-10-06 LAB — VITAMIN D 25 HYDROXY (VIT D DEFICIENCY, FRACTURES): Vit D, 25-Hydroxy: 37.2 ng/mL (ref 30.0–100.0)

## 2021-10-06 LAB — HEMOGLOBIN A1C
Est. average glucose Bld gHb Est-mCnc: 126 mg/dL
Hgb A1c MFr Bld: 6 % — ABNORMAL HIGH (ref 4.8–5.6)

## 2021-10-06 LAB — MAGNESIUM: Magnesium: 1.7 mg/dL (ref 1.6–2.3)

## 2021-10-10 DIAGNOSIS — G4733 Obstructive sleep apnea (adult) (pediatric): Secondary | ICD-10-CM | POA: Diagnosis not present

## 2021-10-15 DIAGNOSIS — Z1152 Encounter for screening for COVID-19: Secondary | ICD-10-CM | POA: Diagnosis not present

## 2021-10-19 ENCOUNTER — Telehealth: Payer: Medicaid Other | Admitting: Physician Assistant

## 2021-10-19 DIAGNOSIS — J302 Other seasonal allergic rhinitis: Secondary | ICD-10-CM

## 2021-10-19 MED ORDER — LORATADINE 10 MG PO TABS
10.0000 mg | ORAL_TABLET | Freq: Every day | ORAL | 0 refills | Status: DC
Start: 2021-10-19 — End: 2022-02-01

## 2021-10-19 MED ORDER — FLUTICASONE PROPIONATE 50 MCG/ACT NA SUSP: 2.0000 | Freq: Every day | NASAL | 0 refills | Status: DC

## 2021-10-19 NOTE — Patient Instructions (Signed)
?[name], thank you for joining [name], PA-C for today's virtual visit.  While this provider is not your primary care provider (PCP), if your PCP is located in our provider database this encounter information will be shared with them immediately following your visit. ? ?Consent: ?(Patient) Karen Brennan provided verbal consent for this virtual visit at the beginning of the encounter. ? ?Current Medications: ? ?Current Outpatient Medications:  ?  fluticasone (FLONASE) 50 MCG/ACT nasal spray, Place 2 sprays into both nostrils daily., Disp: 16 g, Rfl: 0 ?  loratadine (CLARITIN) 10 MG tablet, Take 1 tablet (10 mg total) by mouth daily., Disp: 90 tablet, Rfl: 0 ?  ACCU-CHEK GUIDE test strip, USE TO test fasting blood sugar EVERY DAY in THE morning, Disp: 50 strip, Rfl: 0 ?  Accu-Chek Softclix Lancets lancets, Use as instructed, Disp: 100 each, Rfl: 12 ?  acetaminophen (TYLENOL) 500 MG tablet, Take 1,000 mg by mouth every 6 (six) hours as needed for mild pain., Disp: , Rfl:  ?  albuterol (VENTOLIN HFA) 108 (90 Base) MCG/ACT inhaler, Inhale 2 puffs into the lungs every 4 (four) hours as needed for wheezing or shortness of breath., Disp: 18 g, Rfl: 0 ?  benzonatate (TESSALON) 100 MG capsule, Take 1 capsule (100 mg total) by mouth 3 (three) times daily as needed for cough., Disp: 30 capsule, Rfl: 0 ?  benztropine (COGENTIN) 1 MG tablet, Take 1 mg by mouth 2 (two) times daily., Disp: , Rfl:  ?  blood glucose meter kit and supplies KIT, Dispense based on patient and insurance preference. Use up to four times daily as directed. (FOR ICD-9 250.00, 250.01)., Disp: 1 each, Rfl: 0 ?  Blood Glucose Monitoring Suppl (ACCU-CHEK GUIDE) w/Device KIT, Check blood glucose in AM while fasting, Disp: 1 kit, Rfl: 0 ?  clotrimazole (LOTRIMIN) 1 % cream, APPLY TO AFFECTED AREA TWICE A DAY, Disp: 30 g, Rfl: 0 ?  cyanocobalamin 1000 MCG tablet, Take 1 tablet (1,000 mcg total) by mouth daily., Disp: 30 tablet, Rfl: 1 ?   ferrous sulfate 325 (65 FE) MG tablet, Take 1 tablet (325 mg total) by mouth 2 (two) times daily with a meal., Disp: 60 tablet, Rfl: 0 ?  FLUoxetine (PROZAC) 40 MG capsule, Take 40 mg by mouth daily., Disp: , Rfl:  ?  gabapentin (NEURONTIN) 300 MG capsule, TAKE 1 CAPSULE BY MOUTH THREE TIMES A DAY, Disp: 90 capsule, Rfl: 3 ?  guaiFENesin (MUCINEX) 600 MG 12 hr tablet, Take by mouth 2 (two) times daily., Disp: , Rfl:  ?  guaifenesin (ROBITUSSIN) 100 MG/5ML syrup, Take 200 mg by mouth 3 (three) times daily as needed for cough., Disp: , Rfl:  ?  haloperidol (HALDOL) 2 MG tablet, Take 2 mg by mouth daily., Disp: , Rfl:  ?  hydrochlorothiazide (HYDRODIURIL) 25 MG tablet, Take 1 tablet (25 mg total) by mouth daily., Disp: 90 tablet, Rfl: 1 ?  hydrOXYzine (VISTARIL) 25 MG capsule, Take 25 mg by mouth every evening. Every evening as needed, Disp: , Rfl:  ?  Lancet Devices (ACCU-CHEK SOFTCLIX) lancets, Use as instructed, Disp: 1 each, Rfl: 0 ?  metFORMIN (GLUCOPHAGE) 1000 MG tablet, Take 1 tablet (1,000 mg total) by mouth 2 (two) times daily with a meal., Disp: 60 tablet, Rfl: 0 ?  metoprolol tartrate (LOPRESSOR) 25 MG tablet, TAKE 1/2 TABLET BY MOUTH TWICE DAILY, Disp: 30 tablet, Rfl: 0 ?  Multiple Vitamin (MULTIVITAMIN WITH MINERALS) TABS tablet, Take 1 tablet by mouth daily.,  Disp: , Rfl:  ?  OLANZapine (ZYPREXA) 20 MG tablet, Take 20 mg by mouth at bedtime. , Disp: , Rfl:  ?  polyethylene glycol powder (GLYCOLAX/MIRALAX) 17 GM/SCOOP powder, Take 17 g by mouth 2 (two) times daily as needed. Then daily as needed constipation, Disp: 116 g, Rfl: 0 ?  Probiotic Product (ACIDOPHILUS PROBIOTIC BLEND PO), Take by mouth., Disp: , Rfl:  ?  traZODone (DESYREL) 50 MG tablet, Take 50 mg by mouth at bedtime as needed for sleep., Disp: , Rfl:  ?  valsartan (DIOVAN) 80 MG tablet, Take 1 tablet (80 mg total) by mouth at bedtime., Disp: 90 tablet, Rfl: 3 ?  Vitamin D, Ergocalciferol, (DRISDOL) 1.25 MG (50000 UNIT) CAPS capsule, 1 tab q  wed and 1 tab q sun, Disp: 8 capsule, Rfl: 0  ? ?Medications ordered in this encounter:  ?Meds ordered this encounter  ?Medications  ? fluticasone (FLONASE) 50 MCG/ACT nasal spray  ?  Sig: Place 2 sprays into both nostrils daily.  ?  Dispense:  16 g  ?  Refill:  0  ?  Order Specific Question:   Supervising Provider  ?  Answer:   Noemi Chapel [[id number]]  ? loratadine (CLARITIN) 10 MG tablet  ?  Sig: Take 1 tablet (10 mg total) by mouth daily.  ?  Dispense:  90 tablet  ?  Refill:  0  ?  Order Specific Question:   Supervising Provider  ?  Answer:   Noemi Chapel [[id number]]  ?  ? ?*If you need refills on other medications prior to your next appointment, please contact your pharmacy* ? ?Follow-Up: ?Call back or seek an in-person evaluation if the symptoms worsen or if the condition fails to improve as anticipated. ? ?Other Instructions ?Allergies, Adult ?An allergy means that your body reacts to something that bothers it (allergen). This can happen from something that you eat, breathe in, or touch. ?Allergies often affect the nose, eyes, skin, and stomach. They can be mild, moderate, or very bad (severe). An allergy cannot spread from person to person. They can happen at any age. Sometimes, people outgrow them. ?What are the causes? ?Outdoor things, such as pollen, car fumes, and mold. ?Indoor things, such as dust, smoke, mold, and pets. ?Foods. ?Medicines. ?Things that bother your skin, such as perfume and bug bites. ?What increases the risk? ?Having family members with allergies or asthma. ?What are the signs or symptoms? ?Symptoms depend on how bad your allergy is. ?Mild to moderate symptoms ?Runny nose, stuffy nose, or sneezing. ?Itchy mouth, ears, or throat. ?A feeling of mucus dripping down the back of your throat. ?Sore throat. ?Eyes that are itchy, red, watery, or puffy. ?A skin rash, or red, swollen areas of skin (hives). ?Stomach cramps or bloating. ?Severe symptoms ?Very bad allergies to food, medicine, or bug bites  may cause a very bad allergy reaction (anaphylaxis). This can be life-threatening. Symptoms include: ?A red face. ?Wheezing or coughing. ?Swollen lips, tongue, or mouth. ?Tight or swollen throat. ?Chest pain or tightness, or a fast heartbeat. ?Trouble breathing or shortness of breath. ?Pain in your belly (abdomen), vomiting, or watery poop (diarrhea). ?Feeling dizzy or fainting. ?How is this treated? ? ?  ? ?Treatment for this condition depends on your symptoms. Treatment may include: ?Cold, wet cloths for itching and swelling. ?Eye drops, nose sprays, or skin creams. ?Washing out your nose each day. ?A humidifier. ?Medicines. ?A change to the foods you eat. ?Being exposed again and again to tiny amounts  of allergens. This helps your body get used to them. You might have: ?Allergy shots. ?Very small amounts of allergen put under your tongue. ?An emergency shot (auto-injector pen) if you have a very bad allergy reaction. ?This is a medicine with a needle. You can put it into your skin by yourself. ?Your doctor will teach you how to use it. ?Follow these instructions at home: ?Medicines ? ?Take or apply over-the-counter and prescription medicines only as told by your doctor. ?If you are at risk for a very bad allergy reaction, keep an auto-injector pen with you all the time. ?Eating and drinking ?Follow instructions from your doctor about what to eat and drink. ?Drink enough fluid to keep your pee (urine) pale yellow. ?General instructions ?If you have ever had a very bad allergy reaction, wear a medical alert bracelet or necklace. ?Stay away from things that you are allergic to. ?Keep all follow-up visits as told by your doctor. This is important. ?Contact a doctor if: ?Your symptoms do not get better with treatment. ?Get help right away if: ?You have symptoms of a very bad allergy reaction. These include: ?A swollen mouth, tongue, or throat. ?Pain or tightness in your chest. ?Trouble breathing. ?Being short of  breath. ?Dizziness. ?Fainting. ?Very bad pain in your belly. ?Vomiting. ?Watery poop. ?These symptoms may be an emergency. Do not wait to see if the symptoms will go away. Get medical help right away. Call your

## 2021-10-19 NOTE — Progress Notes (Signed)
?Virtual Visit Consent  ? ?Karen Brennan, you are scheduled for a virtual visit with a [facility] provider today. Just as with appointments in the office, your consent must be obtained to participate. Your consent will be active for this visit and any virtual visit you may have with one of our providers in the next 365 days. If you have a MyChart account, a copy of this consent can be sent to you electronically. ? ?As this is a virtual visit, video technology does not allow for your provider to perform a traditional examination. This may limit your provider's ability to fully assess your condition. If your provider identifies any concerns that need to be evaluated in person or the need to arrange testing (such as labs, EKG, etc.), we will make arrangements to do so. Although advances in technology are sophisticated, we cannot ensure that it will always work on either your end or our end. If the connection with a video visit is poor, the visit may have to be switched to a telephone visit. With either a video or telephone visit, we are not always able to ensure that we have a secure connection. ? ?By engaging in this virtual visit, you consent to the provision of healthcare and authorize for your insurance to be billed (if applicable) for the services provided during this visit. Depending on your insurance coverage, you may receive a charge related to this service. ? ?I need to obtain your verbal consent now. Are you willing to proceed with your visit today? Karen Brennan has provided verbal consent on 10/19/2021 for a virtual visit (video or telephone). [name], PA-C ? ?Date: 10/19/2021 12:20 PM ? ?Virtual Visit via Video Note  ? ?IMar Daring, connected with  Karen Brennan  ([id number], [birth year]) on 10/19/21 at 12:15 PM EDT by a video-enabled telemedicine application and verified that I am speaking with the correct person using two identifiers. ? ?Location: ?Patient: Virtual Visit Location  Patient: Home ?Provider: Virtual Visit Location Provider: Home Office ?  ?I discussed the limitations of evaluation and management by telemedicine and the availability of in person appointments. The patient expressed understanding and agreed to proceed.   ? ?History of Present Illness: ?Karen Brennan is a 46 y.o. who identifies as a female who was assigned female at birth, and is being seen today for seasonal allergies. Symptoms started about 3 days ago. Having increased nasal congestion, rhinorrhea, post nasal drainage, sore throat from drainage, sneezing, and itching. Has used Claritin and flonase in the past successfully, but does not have a current prescription. ? ? ?Problems:  ?Patient Active Problem List  ? Diagnosis Date Noted  ? Absolute anemia 10/06/2021  ? B12 deficiency 08/29/2021  ? Diabetes mellitus ([place]) 08/29/2021  ? Vitamin D deficiency 08/29/2021  ? Constipation 08/29/2021  ? PVC's (premature ventricular contractions) 07/19/2021  ? Pelvic pain 04/14/2021  ? Back pain 07/08/2020  ? Intertrigo 02/16/2020  ? Tachycardia 07/10/2019  ? Systolic murmur 41/74/0814  ? 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 Prediabetes 06/26/2016  ? Neuropathic pain of both feet 05/09/2016  ? 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 Seborrheic dermatitis 12/05/2013  ? Fatigue 01/06/2013  ? Anxiety 10/25/2011  ? Bipolar 1 disorder, mixed ([place]) 09/15/2011  ? Insomnia 10/21/2010  ? Allergic rhinitis 08/31/2008  ? Microcytic anemia 08/14/2008  ? Esophageal reflux 01/24/2008  ? HYPERTENSION, BENIGN ESSENTIAL 08/17/2006  ? Sinusitis, chronic 08/17/2006  ? Morbid obesity with  BMI of 50.0-59.9, adult ([place]) 08/09/2006  ? DEPRESSION, MAJOR, RECURRENT 08/09/2006  ?  ?Allergies:  ?Allergies  ?Allergen Reactions  ? Sibutramine Hcl Monohydrate Hives  ? ?Medications:  ?Current Outpatient Medications:  ?  fluticasone (FLONASE) 50 MCG/ACT nasal spray, Place 2 sprays into both nostrils daily., Disp: 16 g,  Rfl: 0 ?  loratadine (CLARITIN) 10 MG tablet, Take 1 tablet (10 mg total) by mouth daily., Disp: 90 tablet, Rfl: 0 ?  ACCU-CHEK GUIDE test strip, USE TO test fasting blood sugar EVERY DAY in THE morning, Disp: 50 strip, Rfl: 0 ?  Accu-Chek Softclix Lancets lancets, Use as instructed, Disp: 100 each, Rfl: 12 ?  acetaminophen (TYLENOL) 500 MG tablet, Take 1,000 mg by mouth every 6 (six) hours as needed for mild pain., Disp: , Rfl:  ?  albuterol (VENTOLIN HFA) 108 (90 Base) MCG/ACT inhaler, Inhale 2 puffs into the lungs every 4 (four) hours as needed for wheezing or shortness of breath., Disp: 18 g, Rfl: 0 ?  benzonatate (TESSALON) 100 MG capsule, Take 1 capsule (100 mg total) by mouth 3 (three) times daily as needed for cough., Disp: 30 capsule, Rfl: 0 ?  benztropine (COGENTIN) 1 MG tablet, Take 1 mg by mouth 2 (two) times daily., Disp: , Rfl:  ?  blood glucose meter kit and supplies KIT, Dispense based on patient and insurance preference. Use up to four times daily as directed. (FOR ICD-9 250.00, 250.01)., Disp: 1 each, Rfl: 0 ?  Blood Glucose Monitoring Suppl (ACCU-CHEK GUIDE) w/Device KIT, Check blood glucose in AM while fasting, Disp: 1 kit, Rfl: 0 ?  clotrimazole (LOTRIMIN) 1 % cream, APPLY TO AFFECTED AREA TWICE A DAY, Disp: 30 g, Rfl: 0 ?  cyanocobalamin 1000 MCG tablet, Take 1 tablet (1,000 mcg total) by mouth daily., Disp: 30 tablet, Rfl: 1 ?  ferrous sulfate 325 (65 FE) MG tablet, Take 1 tablet (325 mg total) by mouth 2 (two) times daily with a meal., Disp: 60 tablet, Rfl: 0 ?  FLUoxetine (PROZAC) 40 MG capsule, Take 40 mg by mouth daily., Disp: , Rfl:  ?  gabapentin (NEURONTIN) 300 MG capsule, TAKE 1 CAPSULE BY MOUTH THREE TIMES A DAY, Disp: 90 capsule, Rfl: 3 ?  guaiFENesin (MUCINEX) 600 MG 12 hr tablet, Take by mouth 2 (two) times daily., Disp: , Rfl:  ?  guaifenesin (ROBITUSSIN) 100 MG/5ML syrup, Take 200 mg by mouth 3 (three) times daily as needed for cough., Disp: , Rfl:  ?  haloperidol (HALDOL) 2 MG  tablet, Take 2 mg by mouth daily., Disp: , Rfl:  ?  hydrochlorothiazide (HYDRODIURIL) 25 MG tablet, Take 1 tablet (25 mg total) by mouth daily., Disp: 90 tablet, Rfl: 1 ?  hydrOXYzine (VISTARIL) 25 MG capsule, Take 25 mg by mouth every evening. Every evening as needed, Disp: , Rfl:  ?  Lancet Devices (ACCU-CHEK SOFTCLIX) lancets, Use as instructed, Disp: 1 each, Rfl: 0 ?  metFORMIN (GLUCOPHAGE) 1000 MG tablet, Take 1 tablet (1,000 mg total) by mouth 2 (two) times daily with a meal., Disp: 60 tablet, Rfl: 0 ?  metoprolol tartrate (LOPRESSOR) 25 MG tablet, TAKE 1/2 TABLET BY MOUTH TWICE DAILY, Disp: 30 tablet, Rfl: 0 ?  Multiple Vitamin (MULTIVITAMIN WITH MINERALS) TABS tablet, Take 1 tablet by mouth daily., Disp: , Rfl:  ?  OLANZapine (ZYPREXA) 20 MG tablet, Take 20 mg by mouth at bedtime. , Disp: , Rfl:  ?  polyethylene glycol powder (GLYCOLAX/MIRALAX) 17 GM/SCOOP powder, Take 17 g by mouth 2 (  two) times daily as needed. Then daily as needed constipation, Disp: 116 g, Rfl: 0 ?  Probiotic Product (ACIDOPHILUS PROBIOTIC BLEND PO), Take by mouth., Disp: , Rfl:  ?  traZODone (DESYREL) 50 MG tablet, Take 50 mg by mouth at bedtime as needed for sleep., Disp: , Rfl:  ?  valsartan (DIOVAN) 80 MG tablet, Take 1 tablet (80 mg total) by mouth at bedtime., Disp: 90 tablet, Rfl: 3 ?  Vitamin D, Ergocalciferol, (DRISDOL) 1.25 MG (50000 UNIT) CAPS capsule, 1 tab q wed and 1 tab q sun, Disp: 8 capsule, Rfl: 0 ? ?Observations/Objective: ?Patient is well-developed, well-nourished in no acute distress.  ?Resting comfortably at home.  ?Head is normocephalic, atraumatic.  ?No labored breathing.  ?Speech is clear and coherent with logical content.  ?Patient is alert and oriented at baseline.  ? ? ?Assessment and Plan: ?1. Seasonal allergies ?- fluticasone (FLONASE) 50 MCG/ACT nasal spray; Place 2 sprays into both nostrils daily.  Dispense: 16 g; Refill: 0 ?- loratadine (CLARITIN) 10 MG tablet; Take 1 tablet (10 mg total) by mouth daily.   Dispense: 90 tablet; Refill: 0 ? ?- Restart Flonase and Claritin ?- Saline nasal rinses ?- Salt water gargles for sore throat ?- Tylenol and Ibuprofen alternating every 4-6 hours as needed ?- Steam an

## 2021-10-19 NOTE — Progress Notes (Signed)
? ? ? ?Chief Complaint:  ? ?OBESITY ?Karen Brennan is here to discuss her progress with her obesity treatment plan along with follow-up of her obesity related diagnoses. Karen Brennan is on the Category 2 Plan and states she is following her eating plan approximately 80% of the time. Karen Brennan states she is walking, using a foot bike, or weights for 30 minutes 3 times per week. ? ?Today's visit was #: 12 ?Starting weight: 443 lbs ?Starting date: 02/01/2021 ?Today's weight: 412 lbs ?Today's date: 10/05/2021 ?Total lbs lost to date: 7 ?Total lbs lost since last in-office visit: 1 ? ?Interim History: Karen Brennan is still skipping foods and meals, but she is happy that she lost 1 pound. She finds it is still difficult to make herself a priority. Karen Brennan is here with her daughter today who lost 7 pounds. She has lost 7 pounds thus far. ? ?Subjective:  ? ?1. Type 2 diabetes mellitus with other specified complication, without long-term current use of insulin ([place]) ?Karen Brennan is not checking her blood sugar, but she did get a glucometer. She is not getting in enough protein. She denies cravings or hunger. Labs were discussed with the patient today. ? ?2. Vitamin D deficiency ?Karen Brennan's last vitamin D level was 22.9 greater than 3 months ago. She is taking prescription ergocalciferol and has been more consistent in taking her medicine. ? ?3. Other constipation ?Karen Brennan is not drinking enough water at all per patient. She is not sure of the amount though. Her symptoms come and go and she denies concerns. ? ?4. Other iron deficiency anemia ?Karen Brennan is on Fe SO4 BID. Her CBC is improved from her prior on 09/06/21 and her Hgb went from 10.5 to 11.1. Labs were discussed with the patient today. ? ?5. B12 deficiency ?Karen Brennan's recent labs are much improved. Her B12 went from 141 to 1,016 now. She has improved her energy levels some. ? ?Assessment/Plan:  ? ?Orders Placed This Encounter  ?Procedures  ? Comprehensive metabolic panel  ? Hemoglobin A1c  ? VITAMIN D 25 Hydroxy  (Vit-D Deficiency, Fractures)  ? Magnesium  ? ? ?Medications Discontinued During This Encounter  ?Medication Reason  ? polyethylene glycol powder (GLYCOLAX/MIRALAX) 17 GM/SCOOP powder Reorder  ? metFORMIN (GLUCOPHAGE) 1000 MG tablet Reorder  ? Vitamin D, Ergocalciferol, (DRISDOL) 1.25 MG (50000 UNIT) CAPS capsule Reorder  ?  ? ?Meds ordered this encounter  ?Medications  ? metFORMIN (GLUCOPHAGE) 1000 MG tablet  ?  Sig: Take 1 tablet (1,000 mg total) by mouth 2 (two) times daily with a meal.  ?  Dispense:  60 tablet  ?  Refill:  0  ? polyethylene glycol powder (GLYCOLAX/MIRALAX) 17 GM/SCOOP powder  ?  Sig: Take 17 g by mouth 2 (two) times daily as needed. Then daily as needed constipation  ?  Dispense:  116 g  ?  Refill:  0  ?  30 d supply;  ** OV for RF **   Do not send RF request  ? Vitamin D, Ergocalciferol, (DRISDOL) 1.25 MG (50000 UNIT) CAPS capsule  ?  Sig: 1 tab q wed and 1 tab q sun  ?  Dispense:  8 capsule  ?  Refill:  0  ?  30 d supply;  ** OV for RF **   Do not send RF request  ?  ? ?1. Type 2 diabetes mellitus with other specified complication, without long-term current use of insulin ([place]) ?Kaneshia agrees to continue taking metformin with no change in dose. We will check her CMP  and A1c as the last was 5.8 over 3 months ago. She agrees to follow up as directed. ? ?- metFORMIN (GLUCOPHAGE) 1000 MG tablet; Take 1 tablet (1,000 mg total) by mouth 2 (two) times daily with a meal.  Dispense: 60 tablet; Refill: 0 ?- Comprehensive metabolic panel ?- Hemoglobin A1c ?- Magnesium ? ?2. Vitamin D deficiency ?A vitamin D level will be obtained and Karen Brennan agrees to continue taking prescription vitamin D. She agrees to follow up at the agreed upon time.  ? ?- Vitamin D, Ergocalciferol, (DRISDOL) 1.25 MG (50000 UNIT) CAPS capsule; 1 tab q wed and 1 tab q sun  Dispense: 8 capsule; Refill: 0 ?- VITAMIN D 25 Hydroxy (Vit-D Deficiency, Fractures) ? ?3. Other constipation ?Karen Brennan request a refill of Miralax and will follow up as  directed. ? ?- polyethylene glycol powder (GLYCOLAX/MIRALAX) 17 GM/SCOOP powder; Take 17 g by mouth 2 (two) times daily as needed. Then daily as needed constipation  Dispense: 116 g; Refill: 0 ? ?4. Other iron deficiency anemia ?Karen Brennan agrees to continue her Iron supplement on a consistent basis.  ? ?5. B12 deficiency ?Karen Brennan agrees to continue taking a B12 supplement as is and to continue PNP. She agrees to follow up at the agreed upon time. ? ?6. Obesity, current BMI 54.4 ?Karen Brennan agrees to focus on not skipping foods or meals. Reminded her that when she eats all her foods on plan, she loses a lot of weight. ? ?Karen Brennan is currently in the action stage of change. As such, her goal is to continue with weight loss efforts. She has agreed to the Category 2 Plan.  ? ?Exercise goals:  As is. ? ?Behavioral modification strategies: no skipping meals and planning for success. ? ?Karen Brennan has agreed to follow-up with our clinic in 3 weeks. She was informed of the importance of frequent follow-up visits to maximize her success with intensive lifestyle modifications for her multiple health conditions.  ? ?Karen Brennan was informed we would discuss her lab results at her next visit unless there is a critical issue that needs to be addressed sooner. Karen Brennan agreed to keep her next visit at the agreed upon time to discuss these results. ? ?Objective:  ? ?Blood pressure 124/84, pulse 83, temperature 98.7 ?F (37.1 ?C), height 6\' 1"  (1.854 m), weight (!) 412 lb (186.9 kg), SpO2 100 %. ?Body mass index is 54.36 kg/m?. ? ?General: Cooperative, alert, well developed, in no acute distress. ?HEENT: Conjunctivae and lids unremarkable. ?Cardiovascular: Regular rhythm.  ?Lungs: Normal work of breathing. ?Neurologic: No focal deficits.  ? ?Lab Results  ?Component Value Date  ? CREATININE 0.90 10/05/2021  ? BUN 10 10/05/2021  ? NA 142 10/05/2021  ? K 4.6 10/05/2021  ? CL 101 10/05/2021  ? CO2 24 10/05/2021  ? ?Lab Results  ?Component Value Date  ? ALT 15  10/05/2021  ? AST 15 10/05/2021  ? ALKPHOS 68 10/05/2021  ? BILITOT 0.4 10/05/2021  ? ?Lab Results  ?Component Value Date  ? HGBA1C 6.0 (H) 10/05/2021  ? HGBA1C 5.8 (H) 06/20/2021  ? HGBA1C 6.7 (H) 02/01/2021  ? HGBA1C 6.2 02/12/2020  ? HGBA1C 6.3 07/09/2019  ? ?Lab Results  ?Component Value Date  ? INSULIN 22.7 02/01/2021  ? ?Lab Results  ?Component Value Date  ? TSH 1.090 09/06/2021  ? ?Lab Results  ?Component Value Date  ? CHOL 178 02/01/2021  ? HDL 56 02/01/2021  ? LDLCALC 107 (H) 02/01/2021  ? TRIG 81 02/01/2021  ? CHOLHDL 3.2 11/11/2020  ? ?  Lab Results  ?Component Value Date  ? VD25OH 37.2 10/05/2021  ? VD25OH 22.9 (L) 06/20/2021  ? VD25OH 13.9 (L) 02/01/2021  ? ?Lab Results  ?Component Value Date  ? WBC 7.7 09/06/2021  ? HGB 11.1 09/06/2021  ? HCT 36.4 09/06/2021  ? MCV 78 (L) 09/06/2021  ? PLT 392 09/06/2021  ? ?Lab Results  ?Component Value Date  ? IRON 30 06/20/2021  ? TIBC 444 06/20/2021  ? FERRITIN 8 (L) 06/20/2021  ? ? ?Attestation Statements:  ? ?Reviewed by clinician on day of visit: allergies, medications, problem list, medical history, surgical history, family history, social history, and previous encounter notes. ? ?I, Marcille Blanco, CMA, am acting as transcriptionist for [employer], DO ? ?I have reviewed the above documentation for accuracy and completeness, and I agree with the above. Marjory Sneddon, D.O. ? ?The [place] was signed into law in 2016 which includes the topic of electronic health records.  This provides immediate access to information in MyChart.  This includes consultation notes, operative notes, office notes, lab results and pathology reports.  If you have any questions about what you read please let us know at your next visit so we can discuss your concerns and take corrective action if need be.  We are right here with you. ? ?

## 2021-10-21 ENCOUNTER — Telehealth: Payer: Medicaid Other | Admitting: Physician Assistant

## 2021-10-21 DIAGNOSIS — J019 Acute sinusitis, unspecified: Secondary | ICD-10-CM

## 2021-10-21 DIAGNOSIS — B9689 Other specified bacterial agents as the cause of diseases classified elsewhere: Secondary | ICD-10-CM

## 2021-10-21 MED ORDER — AMOXICILLIN-POT CLAVULANATE 875-125 MG PO TABS: 1.0000 | ORAL_TABLET | Freq: Two times a day (BID) | ORAL | 0 refills | Status: DC

## 2021-10-21 NOTE — Progress Notes (Signed)
?Virtual Visit Consent  ? ?Karen Brennan, you are scheduled for a virtual visit with a [facility] provider today. Just as with appointments in the office, your consent must be obtained to participate. Your consent will be active for this visit and any virtual visit you may have with one of our providers in the next 365 days. If you have a MyChart account, a copy of this consent can be sent to you electronically. ? ?As this is a virtual visit, video technology does not allow for your provider to perform a traditional examination. This may limit your provider's ability to fully assess your condition. If your provider identifies any concerns that need to be evaluated in person or the need to arrange testing (such as labs, EKG, etc.), we will make arrangements to do so. Although advances in technology are sophisticated, we cannot ensure that it will always work on either your end or our end. If the connection with a video visit is poor, the visit may have to be switched to a telephone visit. With either a video or telephone visit, we are not always able to ensure that we have a secure connection. ? ?By engaging in this virtual visit, you consent to the provision of healthcare and authorize for your insurance to be billed (if applicable) for the services provided during this visit. Depending on your insurance coverage, you may receive a charge related to this service. ? ?I need to obtain your verbal consent now. Are you willing to proceed with your visit today? Karen Brennan has provided verbal consent on 10/21/2021 for a virtual visit (video or telephone). [name], PA-C ? ?Date: 10/21/2021 9:03 AM ? ?Virtual Visit via Video Note  ? ?IMar Daring, connected with  Karen Brennan  ([id number], 1975/08/27) on 10/21/21 at  9:00 AM EDT by a video-enabled telemedicine application and verified that I am speaking with the correct person using two identifiers. ? ?Location: ?Patient: Virtual Visit Location  Patient: Home ?Provider: Virtual Visit Location Provider: Home Office ?  ?I discussed the limitations of evaluation and management by telemedicine and the availability of in person appointments. The patient expressed understanding and agreed to proceed.   ? ?History of Present Illness: ?Karen Brennan is a 46 y.o. who identifies as a female who was assigned female at birth, and is being seen today for possible sinus infection. ? ?HPI: Sinusitis ?This is a new problem. The current episode started in the past 7 days. The problem has been gradually worsening since onset. There has been no fever. The pain is moderate. Associated symptoms include congestion, coughing, ear pain, headaches, sinus pressure and a sore throat. Pertinent negatives include no hoarse voice. Past treatments include saline sprays and spray decongestants (benadryl). The treatment provided no relief.   ? ? ?Problems:  ?Patient Active Problem List  ? Diagnosis Date Noted  ? Absolute anemia 10/06/2021  ? B12 deficiency 08/29/2021  ? Diabetes mellitus ([place]) 08/29/2021  ? Vitamin D deficiency 08/29/2021  ? Constipation 08/29/2021  ? PVC's (premature ventricular contractions) 07/19/2021  ? Pelvic pain 04/14/2021  ? Back pain 07/08/2020  ? Intertrigo 02/16/2020  ? Tachycardia 07/10/2019  ? Systolic murmur 25/95/6387  ? 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 Prediabetes 06/26/2016  ? Neuropathic pain of both feet 05/09/2016  ? 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 Seborrheic dermatitis 12/05/2013  ? Fatigue 01/06/2013  ? Anxiety 10/25/2011  ? Bipolar 1 disorder, mixed ([place]) 09/15/2011  ?  Insomnia 10/21/2010  ? Allergic rhinitis 08/31/2008  ? Microcytic anemia 08/14/2008  ? Esophageal reflux 01/24/2008  ? HYPERTENSION, BENIGN ESSENTIAL 08/17/2006  ? Sinusitis, chronic 08/17/2006  ? Morbid obesity with BMI of 50.0-59.9, adult ([place]) 08/09/2006  ? DEPRESSION, MAJOR, RECURRENT 08/09/2006  ?  ?Allergies:  ?Allergies   ?Allergen Reactions  ? Sibutramine Hcl Monohydrate Hives  ? ?Medications:  ?Current Outpatient Medications:  ?  amoxicillin-clavulanate (AUGMENTIN) 875-125 MG tablet, Take 1 tablet by mouth 2 (two) times daily., Disp: 14 tablet, Rfl: 0 ?  ACCU-CHEK GUIDE test strip, USE TO test fasting blood sugar EVERY DAY in THE morning, Disp: 50 strip, Rfl: 0 ?  Accu-Chek Softclix Lancets lancets, Use as instructed, Disp: 100 each, Rfl: 12 ?  acetaminophen (TYLENOL) 500 MG tablet, Take 1,000 mg by mouth every 6 (six) hours as needed for mild pain., Disp: , Rfl:  ?  albuterol (VENTOLIN HFA) 108 (90 Base) MCG/ACT inhaler, Inhale 2 puffs into the lungs every 4 (four) hours as needed for wheezing or shortness of breath., Disp: 18 g, Rfl: 0 ?  benzonatate (TESSALON) 100 MG capsule, Take 1 capsule (100 mg total) by mouth 3 (three) times daily as needed for cough., Disp: 30 capsule, Rfl: 0 ?  benztropine (COGENTIN) 1 MG tablet, Take 1 mg by mouth 2 (two) times daily., Disp: , Rfl:  ?  blood glucose meter kit and supplies KIT, Dispense based on patient and insurance preference. Use up to four times daily as directed. (FOR ICD-9 250.00, 250.01)., Disp: 1 each, Rfl: 0 ?  Blood Glucose Monitoring Suppl (ACCU-CHEK GUIDE) w/Device KIT, Check blood glucose in AM while fasting, Disp: 1 kit, Rfl: 0 ?  clotrimazole (LOTRIMIN) 1 % cream, APPLY TO AFFECTED AREA TWICE A DAY, Disp: 30 g, Rfl: 0 ?  cyanocobalamin 1000 MCG tablet, Take 1 tablet (1,000 mcg total) by mouth daily., Disp: 30 tablet, Rfl: 1 ?  ferrous sulfate 325 (65 FE) MG tablet, Take 1 tablet (325 mg total) by mouth 2 (two) times daily with a meal., Disp: 60 tablet, Rfl: 0 ?  FLUoxetine (PROZAC) 40 MG capsule, Take 40 mg by mouth daily., Disp: , Rfl:  ?  fluticasone (FLONASE) 50 MCG/ACT nasal spray, Place 2 sprays into both nostrils daily., Disp: 16 g, Rfl: 0 ?  gabapentin (NEURONTIN) 300 MG capsule, TAKE 1 CAPSULE BY MOUTH THREE TIMES A DAY, Disp: 90 capsule, Rfl: 3 ?  guaiFENesin  (MUCINEX) 600 MG 12 hr tablet, Take by mouth 2 (two) times daily., Disp: , Rfl:  ?  guaifenesin (ROBITUSSIN) 100 MG/5ML syrup, Take 200 mg by mouth 3 (three) times daily as needed for cough., Disp: , Rfl:  ?  haloperidol (HALDOL) 2 MG tablet, Take 2 mg by mouth daily., Disp: , Rfl:  ?  hydrochlorothiazide (HYDRODIURIL) 25 MG tablet, Take 1 tablet (25 mg total) by mouth daily., Disp: 90 tablet, Rfl: 1 ?  hydrOXYzine (VISTARIL) 25 MG capsule, Take 25 mg by mouth every evening. Every evening as needed, Disp: , Rfl:  ?  Lancet Devices (ACCU-CHEK SOFTCLIX) lancets, Use as instructed, Disp: 1 each, Rfl: 0 ?  loratadine (CLARITIN) 10 MG tablet, Take 1 tablet (10 mg total) by mouth daily., Disp: 90 tablet, Rfl: 0 ?  metFORMIN (GLUCOPHAGE) 1000 MG tablet, Take 1 tablet (1,000 mg total) by mouth 2 (two) times daily with a meal., Disp: 60 tablet, Rfl: 0 ?  metoprolol tartrate (LOPRESSOR) 25 MG tablet, TAKE 1/2 TABLET BY MOUTH TWICE DAILY, Disp: 30 tablet, Rfl:  0 ?  Multiple Vitamin (MULTIVITAMIN WITH MINERALS) TABS tablet, Take 1 tablet by mouth daily., Disp: , Rfl:  ?  OLANZapine (ZYPREXA) 20 MG tablet, Take 20 mg by mouth at bedtime. , Disp: , Rfl:  ?  polyethylene glycol powder (GLYCOLAX/MIRALAX) 17 GM/SCOOP powder, Take 17 g by mouth 2 (two) times daily as needed. Then daily as needed constipation, Disp: 116 g, Rfl: 0 ?  Probiotic Product (ACIDOPHILUS PROBIOTIC BLEND PO), Take by mouth., Disp: , Rfl:  ?  traZODone (DESYREL) 50 MG tablet, Take 50 mg by mouth at bedtime as needed for sleep., Disp: , Rfl:  ?  valsartan (DIOVAN) 80 MG tablet, Take 1 tablet (80 mg total) by mouth at bedtime., Disp: 90 tablet, Rfl: 3 ?  Vitamin D, Ergocalciferol, (DRISDOL) 1.25 MG (50000 UNIT) CAPS capsule, 1 tab q wed and 1 tab q sun, Disp: 8 capsule, Rfl: 0 ? ?Observations/Objective: ?Patient is well-developed, well-nourished in no acute distress.  ?Resting comfortably at home.  ?Head is normocephalic, atraumatic.  ?No labored breathing.   ?Speech is clear and coherent with logical content.  ?Patient is alert and oriented at baseline.  ?Sounds congested nasally ? ?Assessment and Plan: ?1. Acute bacterial sinusitis ?- amoxicillin-clavulanate (AUGMENTIN)

## 2021-10-21 NOTE — Patient Instructions (Signed)
?[name], thank you for joining [name], PA-C for today's virtual visit.  While this provider is not your primary care provider (PCP), if your PCP is located in our provider database this encounter information will be shared with them immediately following your visit. ? ?Consent: ?(Patient) Karen Brennan provided verbal consent for this virtual visit at the beginning of the encounter. ? ?Current Medications: ? ?Current Outpatient Medications:  ?  amoxicillin-clavulanate (AUGMENTIN) 875-125 MG tablet, Take 1 tablet by mouth 2 (two) times daily., Disp: 14 tablet, Rfl: 0 ?  ACCU-CHEK GUIDE test strip, USE TO test fasting blood sugar EVERY DAY in THE morning, Disp: 50 strip, Rfl: 0 ?  Accu-Chek Softclix Lancets lancets, Use as instructed, Disp: 100 each, Rfl: 12 ?  acetaminophen (TYLENOL) 500 MG tablet, Take 1,000 mg by mouth every 6 (six) hours as needed for mild pain., Disp: , Rfl:  ?  albuterol (VENTOLIN HFA) 108 (90 Base) MCG/ACT inhaler, Inhale 2 puffs into the lungs every 4 (four) hours as needed for wheezing or shortness of breath., Disp: 18 g, Rfl: 0 ?  benzonatate (TESSALON) 100 MG capsule, Take 1 capsule (100 mg total) by mouth 3 (three) times daily as needed for cough., Disp: 30 capsule, Rfl: 0 ?  benztropine (COGENTIN) 1 MG tablet, Take 1 mg by mouth 2 (two) times daily., Disp: , Rfl:  ?  blood glucose meter kit and supplies KIT, Dispense based on patient and insurance preference. Use up to four times daily as directed. (FOR ICD-9 250.00, 250.01)., Disp: 1 each, Rfl: 0 ?  Blood Glucose Monitoring Suppl (ACCU-CHEK GUIDE) w/Device KIT, Check blood glucose in AM while fasting, Disp: 1 kit, Rfl: 0 ?  clotrimazole (LOTRIMIN) 1 % cream, APPLY TO AFFECTED AREA TWICE A DAY, Disp: 30 g, Rfl: 0 ?  cyanocobalamin 1000 MCG tablet, Take 1 tablet (1,000 mcg total) by mouth daily., Disp: 30 tablet, Rfl: 1 ?  ferrous sulfate 325 (65 FE) MG tablet, Take 1 tablet (325 mg total) by mouth 2 (two) times  daily with a meal., Disp: 60 tablet, Rfl: 0 ?  FLUoxetine (PROZAC) 40 MG capsule, Take 40 mg by mouth daily., Disp: , Rfl:  ?  fluticasone (FLONASE) 50 MCG/ACT nasal spray, Place 2 sprays into both nostrils daily., Disp: 16 g, Rfl: 0 ?  gabapentin (NEURONTIN) 300 MG capsule, TAKE 1 CAPSULE BY MOUTH THREE TIMES A DAY, Disp: 90 capsule, Rfl: 3 ?  guaiFENesin (MUCINEX) 600 MG 12 hr tablet, Take by mouth 2 (two) times daily., Disp: , Rfl:  ?  guaifenesin (ROBITUSSIN) 100 MG/5ML syrup, Take 200 mg by mouth 3 (three) times daily as needed for cough., Disp: , Rfl:  ?  haloperidol (HALDOL) 2 MG tablet, Take 2 mg by mouth daily., Disp: , Rfl:  ?  hydrochlorothiazide (HYDRODIURIL) 25 MG tablet, Take 1 tablet (25 mg total) by mouth daily., Disp: 90 tablet, Rfl: 1 ?  hydrOXYzine (VISTARIL) 25 MG capsule, Take 25 mg by mouth every evening. Every evening as needed, Disp: , Rfl:  ?  Lancet Devices (ACCU-CHEK SOFTCLIX) lancets, Use as instructed, Disp: 1 each, Rfl: 0 ?  loratadine (CLARITIN) 10 MG tablet, Take 1 tablet (10 mg total) by mouth daily., Disp: 90 tablet, Rfl: 0 ?  metFORMIN (GLUCOPHAGE) 1000 MG tablet, Take 1 tablet (1,000 mg total) by mouth 2 (two) times daily with a meal., Disp: 60 tablet, Rfl: 0 ?  metoprolol tartrate (LOPRESSOR) 25 MG tablet, TAKE 1/2 TABLET BY MOUTH TWICE  DAILY, Disp: 30 tablet, Rfl: 0 ?  Multiple Vitamin (MULTIVITAMIN WITH MINERALS) TABS tablet, Take 1 tablet by mouth daily., Disp: , Rfl:  ?  OLANZapine (ZYPREXA) 20 MG tablet, Take 20 mg by mouth at bedtime. , Disp: , Rfl:  ?  polyethylene glycol powder (GLYCOLAX/MIRALAX) 17 GM/SCOOP powder, Take 17 g by mouth 2 (two) times daily as needed. Then daily as needed constipation, Disp: 116 g, Rfl: 0 ?  Probiotic Product (ACIDOPHILUS PROBIOTIC BLEND PO), Take by mouth., Disp: , Rfl:  ?  traZODone (DESYREL) 50 MG tablet, Take 50 mg by mouth at bedtime as needed for sleep., Disp: , Rfl:  ?  valsartan (DIOVAN) 80 MG tablet, Take 1 tablet (80 mg total) by  mouth at bedtime., Disp: 90 tablet, Rfl: 3 ?  Vitamin D, Ergocalciferol, (DRISDOL) 1.25 MG (50000 UNIT) CAPS capsule, 1 tab q wed and 1 tab q sun, Disp: 8 capsule, Rfl: 0  ? ?Medications ordered in this encounter:  ?Meds ordered this encounter  ?Medications  ? amoxicillin-clavulanate (AUGMENTIN) 875-125 MG tablet  ?  Sig: Take 1 tablet by mouth 2 (two) times daily.  ?  Dispense:  14 tablet  ?  Refill:  0  ?  Order Specific Question:   Supervising Provider  ?  Answer:   Noemi Chapel [[id number]]  ?  ? ?*If you need refills on other medications prior to your next appointment, please contact your pharmacy* ? ?Follow-Up: ?Call back or seek an in-person evaluation if the symptoms worsen or if the condition fails to improve as anticipated. ? ?Other Instructions ?Sinus Infection, Adult ?A sinus infection is soreness and swelling (inflammation) of your sinuses. Sinuses are hollow spaces in the bones around your face. They are located: ?Around your eyes. ?In the middle of your forehead. ?Behind your nose. ?In your cheekbones. ?Your sinuses and nasal passages are lined with a fluid called mucus. Mucus drains out of your sinuses. Swelling can trap mucus in your sinuses. This lets germs (bacteria, virus, or fungus) grow, which leads to infection. Most of the time, this condition is caused by a virus. ?What are the causes? ?Allergies. ?Asthma. ?Germs. ?Things that block your nose or sinuses. ?Growths in the nose (nasal polyps). ?Chemicals or irritants in the air. ?A fungus. This is rare. ?What increases the risk? ?Having a weak body defense system (immune system). ?Doing a lot of swimming or diving. ?Using nasal sprays too much. ?Smoking. ?What are the signs or symptoms? ?The main symptoms of this condition are pain and a feeling of pressure around the sinuses. Other symptoms include: ?Stuffy nose (congestion). This may make it hard to breathe through your nose. ?Runny nose (drainage). ?Soreness, swelling, and warmth in the  sinuses. ?A cough that may get worse at night. ?Being unable to smell and taste. ?Mucus that collects in the throat or the back of the nose (postnasal drip). This may cause a sore throat or bad breath. ?Being very tired (fatigued). ?A fever. ?How is this diagnosed? ?Your symptoms. ?Your medical history. ?A physical exam. ?Tests to find out if your condition is short-term (acute) or long-term (chronic). Your doctor may: ?Check your nose for growths (polyps). ?Check your sinuses using a tool that has a light on one end (endoscope). ?Check for allergies or germs. ?Do imaging tests, such as an MRI or CT scan. ?How is this treated? ?Treatment for this condition depends on the cause and whether it is short-term or long-term. ?If caused by a virus, your symptoms should go  away on their own within 10 days. You may be given medicines to relieve symptoms. They include: ?Medicines that shrink swollen tissue in the nose. ?A spray that treats swelling of the nostrils. ?Rinses that help get rid of thick mucus in your nose (nasal saline washes). ?Medicines that treat allergies (antihistamines). ?Over-the-counter pain relievers. ?If caused by bacteria, your doctor may wait to see if you will get better without treatment. You may be given antibiotic medicine if you have: ?A very bad infection. ?A weak body defense system. ?If caused by growths in the nose, surgery may be needed. ?Follow these instructions at home: ?Medicines ?Take, use, or apply over-the-counter and prescription medicines only as told by your doctor. These may include nasal sprays. ?If you were prescribed an antibiotic medicine, take it as told by your doctor. Do not stop taking it even if you start to feel better. ?Hydrate and humidify ? ?Drink enough water to keep your pee (urine) pale yellow. ?Use a cool mist humidifier to keep the humidity level in your home above 50%. ?Breathe in steam for 10-15 minutes, 3-4 times a day, or as told by your doctor. You can do  this in the bathroom while a hot shower is running. ?Try not to spend time in cool or dry air. ?Rest ?Rest as much as you can. ?Sleep with your head raised (elevated). ?Make sure you get enough sleep each night. ?Gen

## 2021-10-23 DIAGNOSIS — Z1152 Encounter for screening for COVID-19: Secondary | ICD-10-CM | POA: Diagnosis not present

## 2021-10-25 ENCOUNTER — Encounter (INDEPENDENT_AMBULATORY_CARE_PROVIDER_SITE_OTHER): Payer: Self-pay | Admitting: Family Medicine

## 2021-10-25 ENCOUNTER — Ambulatory Visit (INDEPENDENT_AMBULATORY_CARE_PROVIDER_SITE_OTHER): Payer: Medicaid Other | Admitting: Family Medicine

## 2021-10-25 VITALS — BP 140/88 | HR 71 | Temp 97.7°F | Ht 73.0 in | Wt >= 6400 oz

## 2021-10-25 DIAGNOSIS — E559 Vitamin D deficiency, unspecified: Secondary | ICD-10-CM | POA: Diagnosis not present

## 2021-10-25 DIAGNOSIS — Z7984 Long term (current) use of oral hypoglycemic drugs: Secondary | ICD-10-CM

## 2021-10-25 DIAGNOSIS — E669 Obesity, unspecified: Secondary | ICD-10-CM | POA: Diagnosis not present

## 2021-10-25 DIAGNOSIS — E66813 Obesity, class 3: Secondary | ICD-10-CM

## 2021-10-25 DIAGNOSIS — K5909 Other constipation: Secondary | ICD-10-CM

## 2021-10-25 DIAGNOSIS — Z6841 Body Mass Index (BMI) 40.0 and over, adult: Secondary | ICD-10-CM

## 2021-10-25 DIAGNOSIS — E1169 Type 2 diabetes mellitus with other specified complication: Secondary | ICD-10-CM

## 2021-10-25 MED ORDER — METFORMIN HCL 1000 MG PO TABS: 1000.0000 mg | ORAL_TABLET | Freq: Two times a day (BID) | ORAL | 0 refills | Status: DC

## 2021-10-25 MED ORDER — VITAMIN D (ERGOCALCIFEROL) 1.25 MG (50000 UNIT) PO CAPS: ORAL_CAPSULE | ORAL | 0 refills | Status: DC

## 2021-10-28 DIAGNOSIS — Z1152 Encounter for screening for COVID-19: Secondary | ICD-10-CM | POA: Diagnosis not present

## 2021-11-02 ENCOUNTER — Other Ambulatory Visit: Payer: Self-pay | Admitting: Family Medicine

## 2021-11-02 DIAGNOSIS — G5793 Unspecified mononeuropathy of bilateral lower limbs: Secondary | ICD-10-CM

## 2021-11-04 DIAGNOSIS — Z1152 Encounter for screening for COVID-19: Secondary | ICD-10-CM | POA: Diagnosis not present

## 2021-11-06 NOTE — Progress Notes (Unsigned)
Chief Complaint:   OBESITY Karen Brennan is here to discuss her progress with her obesity treatment plan along with follow-up of her obesity related diagnoses. Karen Brennan is on the Category 2 Plan and states she is following her eating plan approximately 80% of the time. Karen Brennan states she is using weights, walking, and foot pedaling for  30 minutes 3-4 times per week.  Today's visit was #: 82 Starting weight: 443 lbs Starting date: 02/01/2022 Today's weight: 407 lbs Today's date: 10/25/2021 Total lbs lost to date: 36 lbs Total lbs lost since last in-office visit: 5 lbs  Interim History: Karen Brennan has stuck to the plan more this time and did more portion control, and more protein intake. She denies hunger and cravings. She reports not to be going over 200 calories with snacks: popsicles (45 calories), fiber one brownies, and fruit.  Subjective:   1. Type 2 diabetes mellitus with other specified complication, without long-term current use of insulin (HCC) Labs were discussed today and Jakhia's A1c is worsening; it is now 6.0 and was 5.8. She is currently taking Metformin and denies hunger or cravings. Karen Brennan is starting to eat all her the foods on her plan.  2. Vitamin D deficiency Labs were discussed and her Vitamin D level is 37.2. It has improved, but still can increase a little more. Karen Brennan was advised to increase sun exposure, increase activity outdoors; such as walking.  Assessment/Plan:  No orders of the defined types were placed in this encounter.   Medications Discontinued During This Encounter  Medication Reason   metFORMIN (GLUCOPHAGE) 1000 MG tablet Reorder   Vitamin D, Ergocalciferol, (DRISDOL) 1.25 MG (50000 UNIT) CAPS capsule Reorder     Meds ordered this encounter  Medications   metFORMIN (GLUCOPHAGE) 1000 MG tablet    Sig: Take 1 tablet (1,000 mg total) by mouth 2 (two) times daily with a meal.    Dispense:  60 tablet    Refill:  0   Vitamin D, Ergocalciferol, (DRISDOL) 1.25 MG  (50000 UNIT) CAPS capsule    Sig: 1 tab q wed and 1 tab q sun    Dispense:  8 capsule    Refill:  0    30 d supply;  ** OV for RF **   Do not send RF request     1. Type 2 diabetes mellitus with other specified complication, without long-term current use of insulin ([place]) Karen Brennan will continue taking Metformin 1,000 mg daily and we will refill. Recent labs reviewed today were: CMP, A1C, and Magnesium.  - metFORMIN (GLUCOPHAGE) 1000 MG tablet; Take 1 tablet (1,000 mg total) by mouth 2 (two) times daily with a meal.  Dispense: 60 tablet; Refill: 0  2. Vitamin D deficiency Recent labs were reviewed. We will refill Vitamin D.  - Vitamin D, Ergocalciferol, (DRISDOL) 1.25 MG (50000 UNIT) CAPS capsule; 1 tab q wed and 1 tab q sun  Dispense: 8 capsule; Refill: 0  3. Obesity, current BMI 53.7 Karen Brennan is currently in the action stage of change. As such, her goal is to continue with weight loss efforts. She has agreed to the Category 2 Plan.   Karen Brennan realizes that when she eats and does not skip, she loses weight.  Exercise goals: 150 minutes of exercise weekly.  Behavioral modification strategies: no skipping meals, meal planning and cooking strategies, and planning for success.  Karen Brennan has agreed to follow-up with our clinic in 3 weeks. She was informed of the importance of frequent follow-up visits  to maximize her success with intensive lifestyle modifications for her multiple health conditions.   Objective:   Blood pressure 140/88, pulse 71, temperature 97.7 F (36.5 C), height 6\' 1"  (1.854 m), weight (!) 407 lb (184.6 kg), SpO2 96 %. Body mass index is 53.7 kg/m.  General: Cooperative, alert, well developed, in no acute distress. HEENT: Conjunctivae and lids unremarkable. Cardiovascular: Regular rhythm.  Lungs: Normal work of breathing. Neurologic: No focal deficits.   Lab Results  Component Value Date   CREATININE 0.90 10/05/2021   BUN 10 10/05/2021   NA 142 10/05/2021   K 4.6  10/05/2021   CL 101 10/05/2021   CO2 24 10/05/2021   Lab Results  Component Value Date   ALT 15 10/05/2021   AST 15 10/05/2021   ALKPHOS 68 10/05/2021   BILITOT 0.4 10/05/2021   Lab Results  Component Value Date   HGBA1C 6.0 (H) 10/05/2021   HGBA1C 5.8 (H) 06/20/2021   HGBA1C 6.7 (H) 02/01/2021   HGBA1C 6.2 02/12/2020   HGBA1C 6.3 07/09/2019   Lab Results  Component Value Date   INSULIN 22.7 02/01/2021   Lab Results  Component Value Date   TSH 1.090 09/06/2021   Lab Results  Component Value Date   CHOL 178 02/01/2021   HDL 56 02/01/2021   LDLCALC 107 (H) 02/01/2021   TRIG 81 02/01/2021   CHOLHDL 3.2 11/11/2020   Lab Results  Component Value Date   VD25OH 37.2 10/05/2021   VD25OH 22.9 (L) 06/20/2021   VD25OH 13.9 (L) 02/01/2021   Lab Results  Component Value Date   WBC 7.7 09/06/2021   HGB 11.1 09/06/2021   HCT 36.4 09/06/2021   MCV 78 (L) 09/06/2021   PLT 392 09/06/2021   Lab Results  Component Value Date   IRON 30 06/20/2021   TIBC 444 06/20/2021   FERRITIN 8 (L) 06/20/2021    Attestation Statements:   Reviewed by clinician on day of visit: allergies, medications, problem list, medical history, surgical history, family history, social history, and previous encounter notes.  ILennette Bihari, CMA, am acting as transcriptionist for Dr. Raliegh Scarlet, DO.  I have reviewed the above documentation for accuracy and completeness, and I agree with the above. Marjory Sneddon, D.O.  The [place] was signed into law in 2016 which includes the topic of electronic health records.  This provides immediate access to information in MyChart.  This includes consultation notes, operative notes, office notes, lab results and pathology reports.  If you have any questions about what you read please let us know at your next visit so we can discuss your concerns and take corrective action if need be.  We are right here with you.

## 2021-11-09 ENCOUNTER — Other Ambulatory Visit: Payer: Self-pay | Admitting: Family Medicine

## 2021-11-10 DIAGNOSIS — G4733 Obstructive sleep apnea (adult) (pediatric): Secondary | ICD-10-CM | POA: Diagnosis not present

## 2021-11-15 ENCOUNTER — Encounter (INDEPENDENT_AMBULATORY_CARE_PROVIDER_SITE_OTHER): Payer: Self-pay | Admitting: Family Medicine

## 2021-11-15 ENCOUNTER — Ambulatory Visit (INDEPENDENT_AMBULATORY_CARE_PROVIDER_SITE_OTHER): Payer: Medicaid Other | Admitting: Family Medicine

## 2021-11-15 ENCOUNTER — Encounter: Payer: Self-pay | Admitting: *Deleted

## 2021-11-15 VITALS — BP 126/85 | HR 80 | Temp 98.4°F | Ht 73.0 in | Wt >= 6400 oz

## 2021-11-15 DIAGNOSIS — E669 Obesity, unspecified: Secondary | ICD-10-CM

## 2021-11-15 DIAGNOSIS — E559 Vitamin D deficiency, unspecified: Secondary | ICD-10-CM | POA: Diagnosis not present

## 2021-11-15 DIAGNOSIS — K5909 Other constipation: Secondary | ICD-10-CM

## 2021-11-15 DIAGNOSIS — Z6841 Body Mass Index (BMI) 40.0 and over, adult: Secondary | ICD-10-CM

## 2021-11-15 DIAGNOSIS — E1169 Type 2 diabetes mellitus with other specified complication: Secondary | ICD-10-CM

## 2021-11-15 DIAGNOSIS — Z7984 Long term (current) use of oral hypoglycemic drugs: Secondary | ICD-10-CM

## 2021-11-15 DIAGNOSIS — E538 Deficiency of other specified B group vitamins: Secondary | ICD-10-CM | POA: Diagnosis not present

## 2021-11-15 MED ORDER — VITAMIN D (ERGOCALCIFEROL) 1.25 MG (50000 UNIT) PO CAPS: ORAL_CAPSULE | ORAL | 0 refills | Status: DC

## 2021-11-15 MED ORDER — METFORMIN HCL 1000 MG PO TABS: 1000.0000 mg | ORAL_TABLET | Freq: Two times a day (BID) | ORAL | 0 refills | Status: DC

## 2021-11-15 MED ORDER — CYANOCOBALAMIN 1000 MCG PO TABS: 1000.0000 ug | ORAL_TABLET | Freq: Every day | ORAL | 0 refills | Status: DC

## 2021-11-15 MED ORDER — POLYETHYLENE GLYCOL 3350 17 GM/SCOOP PO POWD: 17.0000 g | Freq: Two times a day (BID) | ORAL | 0 refills | Status: DC | PRN

## 2021-11-15 MED ORDER — LINACLOTIDE 72 MCG PO CAPS: 72.0000 ug | ORAL_CAPSULE | Freq: Every day | ORAL | 0 refills | Status: DC

## 2021-11-16 ENCOUNTER — Other Ambulatory Visit: Payer: Self-pay | Admitting: Family Medicine

## 2021-11-16 DIAGNOSIS — E538 Deficiency of other specified B group vitamins: Secondary | ICD-10-CM

## 2021-11-21 NOTE — Progress Notes (Signed)
Chief Complaint:   OBESITY Karen Brennan is here to discuss her progress with her obesity treatment plan along with follow-up of her obesity related diagnoses. Karen Brennan is on the Category 2 Plan with breakfast options and states she is following her eating plan approximately 80% of the time. Karen Brennan states she is walking, using foot bike and doing weight 30 minutes 3-4 times per week.  Today's visit was #: 14 Starting weight: 443 lbs Starting date: 02/01/2021 Today's weight: 401 lbs Today's date: 11/15/2021 Total lbs lost to date: 42 lbs Total lbs lost since last in-office visit: 6 lbs  Interim History: Mom increased veggies, beans and proteins.  She is skipping less meals and foods.  No concerns today.  She is here to review  recent lab results drawn at last office visit as well.   Subjective:   1. Type 2 diabetes mellitus with obesity (HCC) Discussed labs today with [facility] today.  Worsening labs. Karen Brennan has some hunger and occasional cravings.  Her last A1c slightly worse than prior one.   2. B12 deficiency Discussed labs with [facility] today. B12, 1,016, 2 months ago.  Slowly improving her energy levels.  3. Vitamin D deficiency Discussed labs with [facility] today.  Karen Brennan is tolerating medication(s) well without side effects.  Medication compliance is good as patient endorses taking it as prescribed.  The patient denies additional concerns regarding this condition.  Vitamin D is not quite at goal, but she takes it twice weekly.      4. Other constipation Discussed labs with [facility] today.  Worsening labs (CMP within normal limits). Having a bowel movement every other day.  She is drinking 1/2 gallon of water daily.  She feels bloated and uncomfortable on days she does not poop consistency, normal.    Assessment/Plan:  No orders of the defined types were placed in this encounter.   Medications Discontinued During This Encounter  Medication Reason   cyanocobalamin 1000 MCG tablet  Reorder   polyethylene glycol powder (GLYCOLAX/MIRALAX) 17 GM/SCOOP powder Reorder   metFORMIN (GLUCOPHAGE) 1000 MG tablet Reorder   Vitamin D, Ergocalciferol, (DRISDOL) 1.25 MG (50000 UNIT) CAPS capsule Reorder     Meds ordered this encounter  Medications   metFORMIN (GLUCOPHAGE) 1000 MG tablet    Sig: Take 1 tablet (1,000 mg total) by mouth 2 (two) times daily with a meal.    Dispense:  60 tablet    Refill:  0   polyethylene glycol powder (GLYCOLAX/MIRALAX) 17 GM/SCOOP powder    Sig: Take 17 g by mouth 2 (two) times daily as needed. Then daily as needed constipation    Dispense:  116 g    Refill:  0    30 d supply;  ** OV for RF **   Do not send RF request   Vitamin D, Ergocalciferol, (DRISDOL) 1.25 MG (50000 UNIT) CAPS capsule    Sig: 1 tab q wed and 1 tab q sun    Dispense:  8 capsule    Refill:  0    30 d supply;  ** OV for RF **   Do not send RF request   DISCONTD: cyanocobalamin 1000 MCG tablet    Sig: Take 1 tablet (1,000 mcg total) by mouth daily.    Dispense:  30 tablet    Refill:  0   linaclotide (LINZESS) 72 MCG capsule    Sig: Take 1 capsule (72 mcg total) by mouth daily before breakfast.    Dispense:  30  capsule    Refill:  0     1. Type 2 diabetes mellitus with obesity (HCC) Refill Metformin, see below.  Karen Brennan is to call her insurance about GLP-1 coverage and will let us know what insurance says.  - metFORMIN (GLUCOPHAGE) 1000 MG tablet; Take 1 tablet (1,000 mg total) by mouth 2 (two) times daily with a meal.  Dispense: 60 tablet; Refill: 0  2. B12 deficiency B12 is at goal.  Refill and continue B12 at current dose, see below.  Continue Prudent nutritional plan and B12 rich foods.  cyanocobalamin 1000 MCG tablet               Take 1 tablet (1,000 mcg total) by mouth daily., Starting Tue 11/15/2021, Until Wed 11/16/2021, Normal    3. Vitamin D deficiency Low Vitamin D level contributes to fatigue and are associated with obesity, breast, and colon cancer. She  agrees to continue to take prescription Vitamin D @50 ,000 IU every week and will follow-up for routine testing of Vitamin D, at least 2-3 times per year to avoid over-replacement.  Refill Ergocalciferol twice weekly, see below.   - Vitamin D, Ergocalciferol, (DRISDOL) 1.25 MG (50000 UNIT) CAPS capsule; 1 tab q wed and 1 tab q sun  Dispense: 8 capsule; Refill: 0  4. Other constipation Symptoms are worse despite Miralax twice daily.  Increase water to over 1 gallon daily.  Start Linzess, low dose and increase as needed.  Refill Miralax.  See below.  - polyethylene glycol powder (GLYCOLAX/MIRALAX) 17 GM/SCOOP powder; Take 17 g by mouth 2 (two) times daily as needed. Then daily as needed constipation  Dispense: 116 g; Refill: 0 - linaclotide (LINZESS) 72 MCG capsule; Take 1 capsule (72 mcg total) by mouth daily before breakfast.  Dispense: 30 capsule; Refill: 0  5. Obesity, current BMI 53 Karen Brennan is currently in the action stage of change. As such, her goal is to continue with weight loss efforts. She has agreed to the Category 2 Plan with breakfast options.   Exercise goals: For substantial health benefits, adults should do at least 150 minutes (2 hours and 30 minutes) a week of moderate-intensity, or 75 minutes (1 hour and 15 minutes) a week of vigorous-intensity aerobic physical activity, or an equivalent combination of moderate- and vigorous-intensity aerobic activity. Aerobic activity should be performed in episodes of at least 10 minutes, and preferably, it should be spread throughout the week.   Goal:  Increase activity 5 days a week for at least 30 minutes.  Behavioral modification strategies: increasing lean protein intake, decreasing simple carbohydrates, and planning for success  Karen Brennan has agreed to follow-up with our clinic in 4 weeks. She was informed of the importance of frequent follow-up visits to maximize her success with intensive lifestyle modifications for her multiple health  conditions.   Objective:   Blood pressure 126/85, pulse 80, temperature 98.4 F (36.9 C), height 6\' 1"  (1.854 m), weight (!) 401 lb (181.9 kg), SpO2 100 %. Body mass index is 52.91 kg/m.  General: Cooperative, alert, well developed, in no acute distress. HEENT: Conjunctivae and lids unremarkable. Cardiovascular: Regular rhythm.  Lungs: Normal work of breathing. Neurologic: No focal deficits.   Lab Results  Component Value Date   CREATININE 0.90 10/05/2021   BUN 10 10/05/2021   NA 142 10/05/2021   K 4.6 10/05/2021   CL 101 10/05/2021   CO2 24 10/05/2021   Lab Results  Component Value Date   ALT 15 10/05/2021   AST 15  10/05/2021   ALKPHOS 68 10/05/2021   BILITOT 0.4 10/05/2021   Lab Results  Component Value Date   HGBA1C 6.0 (H) 10/05/2021   HGBA1C 5.8 (H) 06/20/2021   HGBA1C 6.7 (H) 02/01/2021   HGBA1C 6.2 02/12/2020   HGBA1C 6.3 07/09/2019   Lab Results  Component Value Date   INSULIN 22.7 02/01/2021   Lab Results  Component Value Date   TSH 1.090 09/06/2021   Lab Results  Component Value Date   CHOL 178 02/01/2021   HDL 56 02/01/2021   LDLCALC 107 (H) 02/01/2021   TRIG 81 02/01/2021   CHOLHDL 3.2 11/11/2020   Lab Results  Component Value Date   VD25OH 37.2 10/05/2021   VD25OH 22.9 (L) 06/20/2021   VD25OH 13.9 (L) 02/01/2021   Lab Results  Component Value Date   WBC 7.7 09/06/2021   HGB 11.1 09/06/2021   HCT 36.4 09/06/2021   MCV 78 (L) 09/06/2021   PLT 392 09/06/2021   Lab Results  Component Value Date   IRON 30 06/20/2021   TIBC 444 06/20/2021   FERRITIN 8 (L) 06/20/2021    Attestation Statements:   Reviewed by clinician on day of visit: allergies, medications, problem list, medical history, surgical history, family history, social history, and previous encounter notes.  I, Malcolm Metrorina Spence, RMA, am acting as Energy managertranscriptionist for Marsh & McLennanDeborah Cimberly Stoffel, DO.  I have reviewed the above documentation for accuracy and completeness, and I agree with  the above. [name]-  Latina Frank J Hikaru Delorenzo, D.O.  The 21st Century Cures Act was signed into law in 2016 which includes the topic of electronic health records.  This provides immediate access to information in MyChart.  This includes consultation notes, operative notes, office notes, lab results and pathology reports.  If you have any questions about what you read please let us know at your next visit so we can discuss your concerns and take corrective action if need be.  We are right here with you.

## 2021-11-29 ENCOUNTER — Telehealth: Payer: Self-pay

## 2021-11-29 NOTE — Telephone Encounter (Signed)
Patient calls nurse line to report low BP reading. Patient reports taking HCTZ 25 mg and 0.5 tablet of metoprolol 25 mg approx one hour ago. She has since started to feel lightheaded and measured BP at 101/53. Patient reports that she has also been outdoors quite a bit today and has not been drinking a lot of fluids.   Patient has also recently started linzess. Reports two episodes of diarrhea today.   Spoke directly with Dr. Wynelle Link. Recommended that patient rest, hydrate and hold evening metoprolol dose.  Advised patient of provider recommendations. Verbalized understanding. Also instructed patient to reach out to Dr. Sharee Holster regarding concerns for medication side effects to Linzess.   Patient provided with ED precautions.   Veronda Prude, RN

## 2021-11-30 NOTE — Progress Notes (Incomplete)
    SUBJECTIVE:   CHIEF COMPLAINT / HPI:  No chief complaint on file.   Since last visit started on metoprolol 12.5 mg BID for palpitations.  Reduced vitamin B12 supplement to 1000 mcg daily as levels were high normal.  PERTINENT  PMH / PSH: ***  Patient Care Team: Littie Deeds, MD as PCP - General (Family Medicine)   OBJECTIVE:   There were no vitals taken for this visit.  Physical Exam      09/06/2021    9:58 AM  Depression screen PHQ 2/9  Decreased Interest 2  Down, Depressed, Hopeless 2  PHQ - 2 Score 4  Altered sleeping 2  Tired, decreased energy 3  Change in appetite 2  Feeling bad or failure about yourself  1  Trouble concentrating 2  Moving slowly or fidgety/restless 1  Suicidal thoughts 0  PHQ-9 Score 15     {Show previous vital signs (optional):23777}  {Labs  Heme  Chem  Endocrine  Serology  Results Review (optional):23779}  ASSESSMENT/PLAN:   No problem-specific Assessment & Plan notes found for this encounter.    No follow-ups on file.   Littie Deeds, MD [facility]

## 2021-11-30 NOTE — Patient Instructions (Incomplete)
It was nice seeing you today!  Blood work today.  See me in 3 months or whenever is a good for you.  Stay well, Mazen Marcin, MD [facility] [phone]  --  Make sure to check out at the front desk before you leave today.  Please arrive at least 15 minutes prior to your scheduled appointments.  If you had blood work today, I will send you a MyChart message or a letter if results are normal. Otherwise, I will give you a call.  If you had a referral placed, they will call you to set up an appointment. Please give us a call if you don't hear back in the next 2 weeks.  If you need additional refills before your next appointment, please call your pharmacy first.  

## 2021-12-05 ENCOUNTER — Encounter: Payer: Self-pay | Admitting: Family Medicine

## 2021-12-05 ENCOUNTER — Other Ambulatory Visit (INDEPENDENT_AMBULATORY_CARE_PROVIDER_SITE_OTHER): Payer: Self-pay | Admitting: Family Medicine

## 2021-12-05 ENCOUNTER — Ambulatory Visit (INDEPENDENT_AMBULATORY_CARE_PROVIDER_SITE_OTHER): Payer: Medicaid Other | Admitting: Family Medicine

## 2021-12-05 VITALS — BP 126/84 | HR 85 | Ht 73.0 in | Wt >= 6400 oz

## 2021-12-05 DIAGNOSIS — E538 Deficiency of other specified B group vitamins: Secondary | ICD-10-CM

## 2021-12-05 DIAGNOSIS — E1169 Type 2 diabetes mellitus with other specified complication: Secondary | ICD-10-CM

## 2021-12-05 DIAGNOSIS — E669 Obesity, unspecified: Secondary | ICD-10-CM

## 2021-12-05 DIAGNOSIS — R000 Tachycardia, unspecified: Secondary | ICD-10-CM

## 2021-12-05 DIAGNOSIS — E7849 Other hyperlipidemia: Secondary | ICD-10-CM

## 2021-12-05 DIAGNOSIS — E785 Hyperlipidemia, unspecified: Secondary | ICD-10-CM

## 2021-12-05 MED ORDER — ATORVASTATIN CALCIUM 20 MG PO TABS: 20.0000 mg | ORAL_TABLET | Freq: Every day | ORAL | 3 refills | Status: DC

## 2021-12-05 MED ORDER — SEMAGLUTIDE(0.25 OR 0.5MG/DOS) 2 MG/1.5ML ~~LOC~~ SOPN: 0.2500 mg | PEN_INJECTOR | SUBCUTANEOUS | 0 refills | Status: DC

## 2021-12-05 NOTE — Assessment & Plan Note (Signed)
-  lipid panel -start atorvastatin 20 mg

## 2021-12-05 NOTE — Assessment & Plan Note (Signed)
Palpitations have resolved with metoprolol. - can trial metoprolol tartrate 12.5 BID prn rather than scheduled

## 2021-12-05 NOTE — Assessment & Plan Note (Addendum)
Well controlled. - continue metformin - start semaglutide 0.25 mg weekly - f/u rx clinic 1 month for GLP1 titration

## 2021-12-06 ENCOUNTER — Ambulatory Visit (INDEPENDENT_AMBULATORY_CARE_PROVIDER_SITE_OTHER): Payer: Medicaid Other | Admitting: Family Medicine

## 2021-12-06 LAB — LIPID PANEL
Chol/HDL Ratio: 3.1 ratio (ref 0.0–4.4)
Cholesterol, Total: 189 mg/dL (ref 100–199)
HDL: 61 mg/dL (ref >39)
LDL Chol Calc (NIH): 107 mg/dL — ABNORMAL HIGH (ref 0–99)
Triglycerides: 119 mg/dL (ref 0–149)
VLDL Cholesterol Cal: 21 mg/dL (ref 5–40)

## 2021-12-06 LAB — VITAMIN B12: Vitamin B-12: 435 pg/mL (ref 232–1245)

## 2021-12-10 DIAGNOSIS — G4733 Obstructive sleep apnea (adult) (pediatric): Secondary | ICD-10-CM | POA: Diagnosis not present

## 2021-12-16 DIAGNOSIS — Z1152 Encounter for screening for COVID-19: Secondary | ICD-10-CM | POA: Diagnosis not present

## 2021-12-22 ENCOUNTER — Encounter (INDEPENDENT_AMBULATORY_CARE_PROVIDER_SITE_OTHER): Payer: Self-pay | Admitting: Family Medicine

## 2021-12-22 ENCOUNTER — Other Ambulatory Visit: Payer: Self-pay

## 2021-12-22 ENCOUNTER — Ambulatory Visit (INDEPENDENT_AMBULATORY_CARE_PROVIDER_SITE_OTHER): Payer: Medicaid Other | Admitting: Family Medicine

## 2021-12-22 VITALS — BP 107/72 | HR 68 | Temp 98.3°F | Ht 73.0 in | Wt >= 6400 oz

## 2021-12-22 DIAGNOSIS — Z1152 Encounter for screening for COVID-19: Secondary | ICD-10-CM | POA: Diagnosis not present

## 2021-12-22 DIAGNOSIS — K5909 Other constipation: Secondary | ICD-10-CM | POA: Diagnosis not present

## 2021-12-22 DIAGNOSIS — E669 Obesity, unspecified: Secondary | ICD-10-CM

## 2021-12-22 DIAGNOSIS — E1169 Type 2 diabetes mellitus with other specified complication: Secondary | ICD-10-CM

## 2021-12-22 DIAGNOSIS — Z6841 Body Mass Index (BMI) 40.0 and over, adult: Secondary | ICD-10-CM

## 2021-12-22 DIAGNOSIS — D508 Other iron deficiency anemias: Secondary | ICD-10-CM

## 2021-12-22 DIAGNOSIS — Z7985 Long-term (current) use of injectable non-insulin antidiabetic drugs: Secondary | ICD-10-CM | POA: Diagnosis not present

## 2021-12-22 DIAGNOSIS — E1165 Type 2 diabetes mellitus with hyperglycemia: Secondary | ICD-10-CM

## 2021-12-22 DIAGNOSIS — Z7984 Long term (current) use of oral hypoglycemic drugs: Secondary | ICD-10-CM

## 2021-12-22 DIAGNOSIS — E559 Vitamin D deficiency, unspecified: Secondary | ICD-10-CM | POA: Diagnosis not present

## 2021-12-22 MED ORDER — VITAMIN D (ERGOCALCIFEROL) 1.25 MG (50000 UNIT) PO CAPS: ORAL_CAPSULE | ORAL | 0 refills | Status: DC

## 2021-12-22 MED ORDER — LINACLOTIDE 72 MCG PO CAPS: 72.0000 ug | ORAL_CAPSULE | Freq: Every day | ORAL | 0 refills | Status: DC

## 2021-12-22 MED ORDER — METOPROLOL TARTRATE 25 MG PO TABS: 12.5000 mg | ORAL_TABLET | Freq: Two times a day (BID) | ORAL | 0 refills | Status: DC | PRN

## 2021-12-22 MED ORDER — PNV PRENATAL PLUS MULTIVIT+DHA 27-1 & 312 MG PO MISC: ORAL | 0 refills | Status: DC

## 2021-12-22 MED ORDER — METFORMIN HCL 1000 MG PO TABS: 1000.0000 mg | ORAL_TABLET | Freq: Two times a day (BID) | ORAL | 0 refills | Status: DC

## 2021-12-22 MED ORDER — SEMAGLUTIDE(0.25 OR 0.5MG/DOS) 2 MG/1.5ML ~~LOC~~ SOPN: PEN_INJECTOR | SUBCUTANEOUS | 0 refills | Status: DC

## 2021-12-23 ENCOUNTER — Telehealth: Payer: Medicaid Other | Admitting: Cardiology

## 2021-12-23 NOTE — Telephone Encounter (Signed)
Pt care taker, Odester Nilson, called stating that pt needs another CPAP machine because she has the wrong mask

## 2021-12-26 DIAGNOSIS — Z20822 Contact with and (suspected) exposure to covid-19: Secondary | ICD-10-CM | POA: Diagnosis not present

## 2021-12-30 ENCOUNTER — Other Ambulatory Visit: Payer: Self-pay | Admitting: Family Medicine

## 2021-12-30 DIAGNOSIS — G5793 Unspecified mononeuropathy of bilateral lower limbs: Secondary | ICD-10-CM

## 2021-12-31 ENCOUNTER — Ambulatory Visit
Admission: EM | Admit: 2021-12-31 | Discharge: 2021-12-31 | Disposition: A | Discharge status: Home / Self Care | Payer: Medicaid Other

## 2021-12-31 ENCOUNTER — Emergency Department (HOSPITAL_COMMUNITY)
Admission: EM | Admit: 2021-12-31 | Discharge: 2021-12-31 | Disposition: A | Discharge status: Home / Self Care | Payer: Medicaid Other | Attending: Emergency Medicine | Admitting: Emergency Medicine

## 2021-12-31 ENCOUNTER — Other Ambulatory Visit: Payer: Self-pay

## 2021-12-31 ENCOUNTER — Encounter (HOSPITAL_COMMUNITY): Payer: Self-pay | Admitting: Emergency Medicine

## 2021-12-31 ENCOUNTER — Encounter: Payer: Self-pay | Admitting: Emergency Medicine

## 2021-12-31 DIAGNOSIS — R1031 Right lower quadrant pain: Secondary | ICD-10-CM | POA: Insufficient documentation

## 2021-12-31 DIAGNOSIS — Z5321 Procedure and treatment not carried out due to patient leaving prior to being seen by health care provider: Secondary | ICD-10-CM | POA: Insufficient documentation

## 2021-12-31 DIAGNOSIS — R1110 Vomiting, unspecified: Secondary | ICD-10-CM | POA: Insufficient documentation

## 2021-12-31 LAB — CBC
HCT: 40.5 % (ref 36.0–46.0)
Hemoglobin: 12.8 g/dL (ref 12.0–15.0)
MCH: 27.5 pg (ref 26.0–34.0)
MCHC: 31.6 g/dL (ref 30.0–36.0)
MCV: 86.9 fL (ref 80.0–100.0)
Platelets: 364 10*3/uL (ref 150–400)
RBC: 4.66 MIL/uL (ref 3.87–5.11)
RDW: 13.6 % (ref 11.5–15.5)
WBC: 6.2 10*3/uL (ref 4.0–10.5)
nRBC: 0 % (ref 0.0–0.2)

## 2021-12-31 LAB — COMPREHENSIVE METABOLIC PANEL
ALT: 20 U/L (ref 0–44)
AST: 23 U/L (ref 15–41)
Albumin: 3.7 g/dL (ref 3.5–5.0)
Alkaline Phosphatase: 50 U/L (ref 38–126)
Anion gap: 9 (ref 5–15)
BUN: 7 mg/dL (ref 6–20)
CO2: 27 mmol/L (ref 22–32)
Calcium: 8.6 mg/dL — ABNORMAL LOW (ref 8.9–10.3)
Chloride: 101 mmol/L (ref 98–111)
Creatinine, Ser: 0.85 mg/dL (ref 0.44–1.00)
GFR, Estimated: >60 mL/min (ref >60)
Glucose, Bld: 105 mg/dL — ABNORMAL HIGH (ref 70–99)
Potassium: 3.9 mmol/L (ref 3.5–5.1)
Sodium: 137 mmol/L (ref 135–145)
Total Bilirubin: 0.5 mg/dL (ref 0.3–1.2)
Total Protein: 7.1 g/dL (ref 6.5–8.1)

## 2021-12-31 LAB — LIPASE, BLOOD: Lipase: 25 U/L (ref 11–51)

## 2021-12-31 NOTE — Discharge Instructions (Signed)
  Follow up with any recurrent symptoms.

## 2021-12-31 NOTE — ED Notes (Signed)
Pt states she is going to urgent care.

## 2021-12-31 NOTE — ED Triage Notes (Signed)
Pt here for right sided abd pain with episode of N/V that is now resolved

## 2021-12-31 NOTE — Progress Notes (Unsigned)
Chief Complaint:   OBESITY Karen Brennan is here to discuss her progress with her obesity treatment plan along with follow-up of her obesity related diagnoses. Karen Brennan is on the Category 2 Plan with breakfast and lunch options and states she is following her eating plan approximately 80% of the time. Karen Brennan states she is walking 30 minutes 3 times per week.  Today's visit was #: 15 Starting weight: 443 lbs Starting date: 02/01/2022 Today's weight: 402 lbs Today's date: 12/22/2021 Total lbs lost to date: 41 Total lbs lost since last in-office visit: +1  Interim History: Karen Brennan is still skipping meals, but usually always has breakfast.  Has not had any issues with the meal plan.  She makes sure her daughter who is here with her today get's what she needs first.  Subjective:   1. Type 2 diabetes mellitus with hyperglycemia, without long-term current use of insulin (HCC) Last A1c about 3 months ago was at 6.0.  She started Ozempic with PCP last Monday, she is doing well, denies any constipation.   2. Vitamin D deficiency Discussed labs with patient today. She is currently taking prescription vitamin D 50,000 IU each week. She denies nausea, vomiting or muscle weakness.  Karen Brennan voices that her insurance is not paying for her Vitamin D tablets.  Suggested using Good Rx card or Rx Saver cards.  $6.50 for a 90 day supply.  3. Other constipation Karen Brennan voices that Linzess cause loose stools and got dehydrated some.  She does not drinking enough water.  She is only drinking 4 bottles of water per day maximum.     Assessment/Plan:  No orders of the defined types were placed in this encounter.   Medications Discontinued During This Encounter  Medication Reason   Multiple Vitamin (MULTIVITAMIN WITH MINERALS) TABS tablet    ferrous sulfate 325 (65 FE) MG tablet    metFORMIN (GLUCOPHAGE) 1000 MG tablet Reorder   Vitamin D, Ergocalciferol, (DRISDOL) 1.25 MG (50000 UNIT) CAPS capsule Reorder    linaclotide (LINZESS) 72 MCG capsule Reorder   Semaglutide,0.25 or 0.5MG /DOS, 2 MG/1.5ML SOPN      Meds ordered this encounter  Medications   Vitamin D, Ergocalciferol, (DRISDOL) 1.25 MG (50000 UNIT) CAPS capsule    Sig: 1 tab q wed and 1 tab q sun    Dispense:  24 capsule    Refill:  0    90 d supply;  ** OV for RF **   Do not send RF request   metFORMIN (GLUCOPHAGE) 1000 MG tablet    Sig: Take 1 tablet (1,000 mg total) by mouth 2 (two) times daily with a meal.    Dispense:  60 tablet    Refill:  0   linaclotide (LINZESS) 72 MCG capsule    Sig: Take 1 capsule (72 mcg total) by mouth daily before breakfast.    Dispense:  30 capsule    Refill:  0   Prenatal Vit-Fe Fum-FA-Omega (PNV PRENATAL PLUS MULTIVIT+DHA) 27-1 & 312 MG MISC    Sig: 1 po qd    Dispense:  30 each    Refill:  0   Semaglutide,0.25 or 0.5MG /DOS, 2 MG/1.5ML SOPN    Sig: 0.5 mg weekly q Thursday ( already has script)    Dispense:  3 mL    Refill:  0     1. Type 2 diabetes mellitus with hyperglycemia, without long-term current use of insulin (HCC) Refill Metformin, continue Ozempic and increase Ozempic, see below.  Refill - metFORMIN (  GLUCOPHAGE) 1000 MG tablet; Take 1 tablet (1,000 mg total) by mouth 2 (two) times daily with a meal.  Dispense: 60 tablet; Refill: 0  Increase- Semaglutide,0.25 or 0.5MG /DOS, 2 MG/1.5ML SOPN; 0.5 mg weekly q Thursday ( already has script)  Dispense: 3 mL; Refill: 0  2. Vitamin D deficiency Low Vitamin D level contributes to fatigue and are associated with obesity, breast, and colon cancer. She agrees to continue to take prescription Vitamin D @50 ,000 IU every week and will follow-up for routine testing of Vitamin D, at least 2-3 times per year to avoid over-replacement. Karen Brennan requested a prenatal vitamin instead of  a prescription for Vitamin D. Vitamin D level is not at goal.  that this will not help increase her Vitamin D level or support an adequate vitamin d level.   But I will give her a prescription for a prenatal vitamin due to recent deficiency.   Counseling given.  Refill - Vitamin D, Ergocalciferol, (DRISDOL) 1.25 MG (50000 UNIT) CAPS capsule; 1 tab q wed and 1 tab q sun  Dispense: 24 capsule; Refill: 0  Start - Prenatal Vit-Fe Fum-FA-Omega (PNV PRENATAL PLUS MULTIVIT+DHA) 27-1 & 312 MG MISC; 1 po qd  Dispense: 30 each; Refill: 0  3. Other constipation Increase water intake to 1 gallon per day.  Continue medication due to increase in Ozempic dose today.   Refill - linaclotide (LINZESS) 72 MCG capsule; Take 1 capsule (72 mcg total) by mouth daily before breakfast.  Dispense: 30 capsule; Refill: 0  4. Obesity, current BMI 53.1 Try to eat all foods on meal plan.   Karen Brennan is currently in the action stage of change. As such, her goal is to continue with weight loss efforts. She has agreed to the Category 2 Plan with breakfast and lunch options.  Exercise goals:  As is.   Behavioral modification strategies: increasing lean protein intake and decreasing simple carbohydrates.  Karen Brennan has agreed to follow-up with our clinic in 3 weeks. She was informed of the importance of frequent follow-up visits to maximize her success with intensive lifestyle modifications for her multiple health conditions.   Objective:   Blood pressure 107/72, pulse 68, temperature 98.3 F (36.8 C), height 6\' 1"  (1.854 m), weight (!) 402 lb (182.3 kg), SpO2 100 %. Body mass index is 53.04 kg/m.  General: Cooperative, alert, well developed, in no acute distress. HEENT: Conjunctivae and lids unremarkable. Cardiovascular: Regular rhythm.  Lungs: Normal work of breathing. Neurologic: No focal deficits.   Lab Results  Component Value Date   CREATININE 0.85 12/31/2021   BUN 7 12/31/2021   NA 137 12/31/2021   K 3.9 12/31/2021   CL 101 12/31/2021   CO2 27 12/31/2021   Lab Results  Component Value Date   ALT 20 12/31/2021   AST 23 12/31/2021   ALKPHOS 50 12/31/2021    BILITOT 0.5 12/31/2021   Lab Results  Component Value Date   HGBA1C 6.0 (H) 10/05/2021   HGBA1C 5.8 (H) 06/20/2021   HGBA1C 6.7 (H) 02/01/2021   HGBA1C 6.2 02/12/2020   HGBA1C 6.3 07/09/2019   Lab Results  Component Value Date   INSULIN 22.7 02/01/2021   Lab Results  Component Value Date   TSH 1.090 09/06/2021   Lab Results  Component Value Date   CHOL 189 12/05/2021   HDL 61 12/05/2021   LDLCALC 107 (H) 12/05/2021   TRIG 119 12/05/2021   CHOLHDL 3.1 12/05/2021   Lab Results  Component Value Date  VD25OH 37.2 10/05/2021   VD25OH 22.9 (L) 06/20/2021   VD25OH 13.9 (L) 02/01/2021   Lab Results  Component Value Date   WBC 6.2 12/31/2021   HGB 12.8 12/31/2021   HCT 40.5 12/31/2021   MCV 86.9 12/31/2021   PLT 364 12/31/2021   Lab Results  Component Value Date   IRON 30 06/20/2021   TIBC 444 06/20/2021   FERRITIN 8 (L) 06/20/2021    Attestation Statements:   Reviewed by clinician on day of visit: allergies, medications, problem list, medical history, surgical history, family history, social history, and previous encounter notes.  I, Malcolm Metro, RMA, am acting as Energy manager for [employer], DO.   I have reviewed the above documentation for accuracy and completeness, and I agree with the above. [name], D.O.  The 21st Century Cures Act was signed into law in 2016 which includes the topic of electronic health records.  This provides immediate access to information in MyChart.  This includes consultation notes, operative notes, office notes, lab results and pathology reports.  If you have any questions about what you read please let us know at your next visit so we can discuss your concerns and take corrective action if need be.  We are right here with you.

## 2021-12-31 NOTE — ED Provider Notes (Signed)
[facility]    CSN: [id number] Arrival date & time: 12/31/21  1103      History   Chief Complaint Chief Complaint  Patient presents with   Nausea    HPI Karen Brennan is a 46 y.o. female.   Patient here today for evaluation of right lower quadrant pain with an episode of nausea and vomiting that occurred earlier this morning.  She reports symptoms were intense when present however after vomiting symptoms have resolved.  She is no longer having abdominal pain and reports nausea has also resolved.  She has not taken any medication for symptoms.  She does report taking Linzess and is not sure if this was the cause of her pain.  The history is provided by the patient.    Past Medical History:  Diagnosis Date   ANEMIA 08/14/2008   Qualifier: Diagnosis of  By: Genene Churn MD, Jessica     Anxiety    Asthma    Bipolar 1 disorder ([place])    Blood transfusion    Cellulitis 07/15/2020   Of right buttock   Chest pain    Depression    Edema of both lower extremities    GERD (gastroesophageal reflux disease)    History of bronchitis    Hypertension    Kidney stones 10/09/2011   Migraines 10/09/2011   "often"   Pre-diabetes    Schizophrenia ([place])    SOB (shortness of breath)     Patient Active Problem List   Diagnosis Date Noted   Hyperlipidemia associated with type 2 diabetes mellitus ([place]) 12/05/2021   B12 deficiency 08/29/2021   Type II diabetes mellitus ([place]) 08/29/2021   Vitamin D deficiency 08/29/2021   Constipation 08/29/2021   PVC's (premature ventricular contractions) 07/19/2021   Intertrigo 02/16/2020   Tachycardia 38/93/7342   Systolic murmur 87/68/1157   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   Neuropathic pain of both feet 05/09/2016   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   Anxiety 10/25/2011   Bipolar 1 disorder, mixed ([place]) 09/15/2011   Allergic rhinitis 08/31/2008   Microcytic anemia 08/14/2008   Esophageal  reflux 01/24/2008   Hypertension associated with diabetes ([place]) 08/17/2006   Morbid obesity with BMI of 50.0-59.9, adult ([place]) 08/09/2006    Past Surgical History:  Procedure Laterality Date   ANKLE FRACTURE SURGERY   1990's   "left; put screws in"   ENDOMETRIAL ABLATION  ~ 2011   FRACTURE SURGERY     GASTRIC BY PASS     TUBAL LIGATION  2006    OB History     Gravida  5   Para  4   Term  4   Preterm      AB  1   Living  4      SAB      IAB      Ectopic  1   Multiple      Live Births               Home Medications    Prior to Admission medications   Medication Sig Start Date End Date Taking? Authorizing Provider  ACCU-CHEK GUIDE test strip USE TO test fasting blood sugar EVERY DAY in THE morning 10/04/21   Zola Button, MD  Accu-Chek Softclix Lancets lancets USE TO test fasting blood sugar EVERY DAY IN THE MORNING 12/30/21   Alcus Dad, MD  acetaminophen (TYLENOL) 500 MG tablet Take 1,000 mg by mouth every 6 (six) hours as needed  for mild pain.    [provider]  albuterol (VENTOLIN HFA) 108 (90 Base) MCG/ACT inhaler Inhale 2 puffs into the lungs every 4 (four) hours as needed for wheezing or shortness of breath. 09/24/21   Mayers, Cari S, PA-C  atorvastatin (LIPITOR) 20 MG tablet Take 1 tablet (20 mg total) by mouth daily. 12/05/21   Zola Button, MD  benzonatate (TESSALON) 100 MG capsule Take 1 capsule (100 mg total) by mouth 3 (three) times daily as needed for cough. 07/21/21   Brunetta Jeans, PA-C  benztropine (COGENTIN) 1 MG tablet Take 1 mg by mouth 2 (two) times daily. 06/18/19   [provider]  blood glucose meter kit and supplies KIT Dispense based on patient and insurance preference. Use up to four times daily as directed. (FOR ICD-9 250.00, 250.01). 09/06/21   Zola Button, MD  Blood Glucose Monitoring Suppl (ACCU-CHEK GUIDE) w/Device KIT Check blood glucose in AM while fasting 09/07/21   Zola Button, MD  clotrimazole (LOTRIMIN)  1 % cream APPLY TO AFFECTED AREA TWICE A DAY 06/24/21   Zola Button, MD  FLUoxetine (PROZAC) 40 MG capsule Take 40 mg by mouth daily.    [provider]  fluticasone (FLONASE) 50 MCG/ACT nasal spray Place 2 sprays into both nostrils daily. 10/19/21   [name], PA-C  gabapentin (NEURONTIN) 300 MG capsule take 1 Capsule by mouth three times daily 12/30/21   Alcus Dad, MD  guaiFENesin (MUCINEX) 600 MG 12 hr tablet Take by mouth 2 (two) times daily.    [provider]  guaifenesin (ROBITUSSIN) 100 MG/5ML syrup Take 200 mg by mouth 3 (three) times daily as needed for cough.    [provider]  haloperidol (HALDOL) 2 MG tablet Take 2 mg by mouth daily.    [provider]  hydrochlorothiazide (HYDRODIURIL) 25 MG tablet Take 1 tablet (25 mg total) by mouth daily. 09/22/21   Zola Button, MD  hydrOXYzine (VISTARIL) 25 MG capsule Take 25 mg by mouth every evening. Every evening as needed 10/24/20   [provider]  Lancet Devices [facility]) lancets Use as instructed 02/16/17   Nicolette Bang, MD  linaclotide Rolan Lipa) 72 MCG capsule Take 1 capsule (72 mcg total) by mouth daily before breakfast. 12/22/21   Opalski, Neoma Laming, DO  loratadine (CLARITIN) 10 MG tablet Take 1 tablet (10 mg total) by mouth daily. 10/19/21   [name], PA-C  metFORMIN (GLUCOPHAGE) 1000 MG tablet Take 1 tablet (1,000 mg total) by mouth 2 (two) times daily with a meal. 12/22/21   Opalski, Neoma Laming, DO  metoprolol tartrate (LOPRESSOR) 25 MG tablet TAKE 1/2 TABLET BY MOUTH TWICE DAILY 12/30/21   Alcus Dad, MD  OLANZapine (ZYPREXA) 20 MG tablet Take 20 mg by mouth at bedtime.     [provider]  polyethylene glycol powder (GLYCOLAX/MIRALAX) 17 GM/SCOOP powder Take 17 g by mouth 2 (two) times daily as needed. Then daily as needed constipation 11/15/21   Mellody Dance, DO  Prenatal Vit-Fe Fum-FA-Omega (PNV PRENATAL PLUS MULTIVIT+DHA) 27-1 & 312  MG MISC 1 po qd 12/22/21   Opalski, Neoma Laming, DO  Probiotic Product (ACIDOPHILUS PROBIOTIC BLEND PO) Take by mouth.    [provider]  Semaglutide,0.25 or 0.5MG/DOS, 2 MG/1.5ML SOPN 0.5 mg weekly q Thursday ( already has script) 12/22/21   Opalski, Neoma Laming, DO  traZODone (DESYREL) 50 MG tablet Take 50 mg by mouth at bedtime as needed for sleep. 04/07/19   [provider]  valsartan (  DIOVAN) 80 MG tablet TAKE 1 Tablet BY MOUTH ONCE DAILY at bedtime 11/16/21   Zola Button, MD  vitamin B-12 (CYANOCOBALAMIN) 1000 MCG tablet TAKE 1 Tablet BY MOUTH ONCE DAILY 11/16/21   Zola Button, MD  Vitamin D, Ergocalciferol, (DRISDOL) 1.25 MG (50000 UNIT) CAPS capsule 1 tab q wed and 1 tab q sun 12/22/21   Mellody Dance, DO    Family History Family History  Problem Relation Age of Onset   Heart failure Mother    Hyperlipidemia Mother    Hypertension Mother    Diabetes Mother    Sudden death Mother    Depression Mother    Bipolar disorder Mother    Schizophrenia Mother    Hypertension Father     Social History Social History   Tobacco Use   Smoking status: Former    Packs/day: 0.25    Years: 4.00    Total pack years: 1.00    Types: Cigarettes    Quit date: 06/12/2004    Years since quitting: 17.5   Smokeless tobacco: Never  Vaping Use   Vaping Use: Never used  Substance Use Topics   Alcohol use: No   Drug use: No     Allergies   Sibutramine hcl monohydrate   Review of Systems Review of Systems  Constitutional:  Negative for chills and fever.  Eyes:  Negative for discharge and redness.  Gastrointestinal:  Positive for abdominal pain, nausea and vomiting.       All GI symptoms resolved     Physical Exam Triage Vital Signs ED Triage Vitals  Enc Vitals Group     BP 12/31/21 1123 134/88     Pulse Rate 12/31/21 1123 98     Resp 12/31/21 1123 18     Temp 12/31/21 1123 98.2 F (36.8 C)     Temp Source 12/31/21 1123 Oral     SpO2 12/31/21 1123 95 %     Weight --       Height --      Head Circumference --      Peak Flow --      Pain Score 12/31/21 1124 0     Pain Loc --      Pain Edu? --      Excl. in [place]? --    No data found.  Updated Vital Signs BP 134/88 (BP Location: Left Arm)   Pulse 98   Temp 98.2 F (36.8 C) (Oral)   Resp 18   SpO2 95%      Physical Exam Vitals and nursing note reviewed.  Constitutional:      General: She is not in acute distress.    Appearance: Normal appearance. She is not ill-appearing.  HENT:     Head: Normocephalic and atraumatic.  Eyes:     Conjunctiva/sclera: Conjunctivae normal.  Cardiovascular:     Rate and Rhythm: Normal rate and regular rhythm.  Pulmonary:     Effort: Pulmonary effort is normal. No respiratory distress.     Breath sounds: Normal breath sounds. No wheezing, rhonchi or rales.  Abdominal:     General: Abdomen is flat. Bowel sounds are normal. There is no distension.     Tenderness: There is no abdominal tenderness. There is no guarding or rebound.  Neurological:     Mental Status: She is alert.  Psychiatric:        Mood and Affect: Mood normal.        Behavior: Behavior normal.  Thought Content: Thought content normal.      UC Treatments / Results  Labs (all labs ordered are listed, but only abnormal results are displayed) Labs Reviewed - No data to display  EKG   Radiology No results found.  Procedures Procedures (including critical care time)  Medications Ordered in UC Medications - No data to display  Initial Impression / Assessment and Plan / UC Course  I have reviewed the triage vital signs and the nursing notes.  Pertinent labs & imaging results that were available during my care of the patient were reviewed by me and considered in my medical decision making (see chart for details).    Unknown etiology of symptoms but discussed appendicitis versus gallbladder etiology versus other benign cause.  Recommended she continue to monitor symptoms and  follow-up if they recur.  Patient expresses understanding.  Final Clinical Impressions(s) / UC Diagnoses   Final diagnoses:  RLQ abdominal pain     Discharge Instructions       Follow up with any recurrent symptoms.      ED Prescriptions   None    PDMP not reviewed this encounter.   Francene Finders, PA-C 12/31/21 1213

## 2021-12-31 NOTE — ED Triage Notes (Signed)
Patient here with complaint of RLQ abdominal pain and vomiting that started this morning. Patient denies diarrhea, is alert, oriented, ambulatory, and in no apparent distress at this time.

## 2022-01-05 ENCOUNTER — Encounter: Payer: Self-pay | Admitting: Pharmacist

## 2022-01-05 ENCOUNTER — Ambulatory Visit (INDEPENDENT_AMBULATORY_CARE_PROVIDER_SITE_OTHER): Payer: Medicaid Other | Admitting: Pharmacist

## 2022-01-05 DIAGNOSIS — E1165 Type 2 diabetes mellitus with hyperglycemia: Secondary | ICD-10-CM

## 2022-01-05 MED ORDER — ACCU-CHEK SOFTCLIX LANCETS MISC: 6 refills | Status: DC

## 2022-01-05 MED ORDER — OZEMPIC (0.25 OR 0.5 MG/DOSE) 2 MG/3ML ~~LOC~~ SOPN
0.5000 mg | PEN_INJECTOR | SUBCUTANEOUS | 2 refills | Status: DC
Start: 2022-01-05 — End: 2022-02-15

## 2022-01-05 MED ORDER — ACCU-CHEK GUIDE VI STRP: 1.0000 | ORAL_STRIP | 6 refills | Status: DC | PRN

## 2022-01-05 NOTE — Patient Instructions (Addendum)
It was nice to see you today!  Your goal blood sugar is 80-130 before eating and less than 180 after eating.  Medication Changes  Continue Ozempic 0.5mg  once weekly - No dose increase today.  Follow-up with [facility] next [facility].   Follow-up with Dr. Wynelle Link or another provider in August. - make appointment   Monitor blood sugars at home and keep a log (glucometer or piece of paper) to bring with you to your next visit.  Keep up the good work with diet and exercise. Aim for a diet full of vegetables, fruit and lean meats (chicken, [place], fish). Try to limit salt intake by eating fresh or frozen vegetables (instead of canned), rinse canned vegetables prior to cooking and do not add any additional salt to meals.

## 2022-01-05 NOTE — Assessment & Plan Note (Signed)
Diabetes currently under good control with combination metformin and semglutide regimen. Patient is able to verbalize appropriate hypoglycemia management plan. Medication adherence appears good. Concern for episode of severe GI symptoms and continued intermittent symptoms since starting Ozempic (semaglutide).  -Continued GLP-1 Ozempic (semaglutide) at 0.5mg  once weekly.  -Continued metformin 1000mg  BID   -Patient educated balance of side effects and efficacy.  Next visit with [facility] in 1 week (Aug 3rd)

## 2022-01-05 NOTE — Progress Notes (Signed)
REveiwed: I agree with Dr. Koval's documentation and management. 

## 2022-01-05 NOTE — Progress Notes (Signed)
    S:     Chief Complaint  Patient presents with   Medication Management    Diabetes - Ozempic   Karen Brennan is a 46 y.o. female who presents for diabetes evaluation, education, and management.  PMH is significant for recent emergent evaluation of right lower quadrant pain .  Patient was referred and last seen by Primary Care Provider, Dr. Wynelle Link on 12/05/2021.   At last PCP visit 12/05/2021, Ozempic was initiated at 0.25mg  On 12/22/2021 at the [facility]  Ozempic was titrated to 0.5mg  weekly.  On 12/31/2021 patient experienced 10/10 crampy severe right lower quadrant abdominal pain, nausea and vomiting which resolved by the time she was evaluated at the urgent care.  She continues to take Ozempic 0.5mg  weekly.  Today, patient arrives in good spirits and presents without any assistance. She is upbeat and appears without pain.  She reports intermitent abdominal pain of 2/10 daily since her 7/22 pain incident.   Current diabetes medications include: metformin 1000mg  BID and Ozempic (semaglutide) 0.5mg  weekly.   Insurance coverage: Medicaid  Patient denies hypoglycemic events.  Abdominal complain discussed with Dr. .   O:   ROS  Physical Exam Vitals reviewed.  Constitutional:      Appearance: She is obese.  Pulmonary:     Effort: Pulmonary effort is normal.  Neurological:     Mental Status: She is alert.  Psychiatric:        Mood and Affect: Mood normal.        Behavior: Behavior normal.        Thought Content: Thought content normal.     Lab Results  Component Value Date   HGBA1C 6.0 (H) 10/05/2021   Vitals:   01/05/22 0952  BP: (!) 82/70  Pulse: 90  SpO2: 100%    Lipid Panel     Component Value Date/Time   CHOL 189 12/05/2021 1406   TRIG 119 12/05/2021 1406   HDL 61 12/05/2021 1406   CHOLHDL 3.1 12/05/2021 1406   CHOLHDL 3.8 12/09/2014 1102   VLDL 25 12/09/2014 1102   LDLCALC 107 (H) 12/05/2021 1406    Patient is  participating in a Managed Medicaid Plan:  Yes   A/P: Diabetes currently under good control with combination metformin and semglutide regimen. Patient is able to verbalize appropriate hypoglycemia management plan. Medication adherence appears good. Concern for episode of severe GI symptoms and continued intermittent symptoms since starting Ozempic (semaglutide).  -Continued GLP-1 Ozempic (semaglutide) at 0.5mg  once weekly.  (No titration today) -Continued metformin 1000mg  BID   -Patient educated balance of side effects and efficacy.  Next visit with [facility] in 1 week (Aug 3rd)   -Right sided abdominal pain currently minimal - Evaluated by Dr. - attending physician of the day.  Patient educated to seek care for recurrent pain that is severe for additional evaluation and management and asked to return to PCP for evaluation of gal stones with ultrasound.   Written patient instructions provided. Patient verbalized understanding of treatment plan.  Total time in face to face counseling 28 minutes.    Follow-up:  Pharmacist PRN. PCP clinic visit in the next 4 weeks.

## 2022-01-12 ENCOUNTER — Ambulatory Visit (INDEPENDENT_AMBULATORY_CARE_PROVIDER_SITE_OTHER): Payer: Medicaid Other | Admitting: Family Medicine

## 2022-01-12 ENCOUNTER — Encounter (INDEPENDENT_AMBULATORY_CARE_PROVIDER_SITE_OTHER): Payer: Self-pay | Admitting: Family Medicine

## 2022-01-12 VITALS — BP 134/84 | HR 82 | Temp 98.2°F | Ht 73.0 in | Wt 396.0 lb

## 2022-01-12 DIAGNOSIS — E669 Obesity, unspecified: Secondary | ICD-10-CM

## 2022-01-12 DIAGNOSIS — E538 Deficiency of other specified B group vitamins: Secondary | ICD-10-CM | POA: Diagnosis not present

## 2022-01-12 DIAGNOSIS — Z6841 Body Mass Index (BMI) 40.0 and over, adult: Secondary | ICD-10-CM

## 2022-01-12 DIAGNOSIS — E1165 Type 2 diabetes mellitus with hyperglycemia: Secondary | ICD-10-CM | POA: Diagnosis not present

## 2022-01-12 DIAGNOSIS — E559 Vitamin D deficiency, unspecified: Secondary | ICD-10-CM

## 2022-01-12 DIAGNOSIS — Z7984 Long term (current) use of oral hypoglycemic drugs: Secondary | ICD-10-CM

## 2022-01-12 DIAGNOSIS — Z7985 Long-term (current) use of injectable non-insulin antidiabetic drugs: Secondary | ICD-10-CM

## 2022-01-12 DIAGNOSIS — R109 Unspecified abdominal pain: Secondary | ICD-10-CM

## 2022-01-12 DIAGNOSIS — K5909 Other constipation: Secondary | ICD-10-CM | POA: Diagnosis not present

## 2022-01-12 DIAGNOSIS — E119 Type 2 diabetes mellitus without complications: Secondary | ICD-10-CM

## 2022-01-12 MED ORDER — LINACLOTIDE 72 MCG PO CAPS: 72.0000 ug | ORAL_CAPSULE | Freq: Every day | ORAL | 0 refills | Status: DC

## 2022-01-12 MED ORDER — SEMAGLUTIDE (1 MG/DOSE) 4 MG/3ML ~~LOC~~ SOPN: 1.0000 mg | PEN_INJECTOR | SUBCUTANEOUS | 0 refills | Status: DC

## 2022-01-12 MED ORDER — VITAMIN D (ERGOCALCIFEROL) 1.25 MG (50000 UNIT) PO CAPS: ORAL_CAPSULE | ORAL | 0 refills | Status: DC

## 2022-01-12 MED ORDER — PNV PRENATAL PLUS MULTIVIT+DHA 27-1 & 312 MG PO MISC: ORAL | 0 refills | Status: DC

## 2022-01-12 MED ORDER — METFORMIN HCL 1000 MG PO TABS
1000.0000 mg | ORAL_TABLET | Freq: Two times a day (BID) | ORAL | 0 refills | Status: DC
Start: 2022-01-12 — End: 2022-02-15

## 2022-01-13 DIAGNOSIS — Z1152 Encounter for screening for COVID-19: Secondary | ICD-10-CM | POA: Diagnosis not present

## 2022-01-13 LAB — VITAMIN D 25 HYDROXY (VIT D DEFICIENCY, FRACTURES): Vit D, 25-Hydroxy: 46.1 ng/mL (ref 30.0–100.0)

## 2022-01-13 LAB — HEMOGLOBIN A1C
Est. average glucose Bld gHb Est-mCnc: 126 mg/dL
Hgb A1c MFr Bld: 6 % — ABNORMAL HIGH (ref 4.8–5.6)

## 2022-01-13 LAB — VITAMIN B12: Vitamin B-12: 721 pg/mL (ref 232–1245)

## 2022-01-13 LAB — INSULIN, RANDOM: INSULIN: 11 u[IU]/mL (ref 2.6–24.9)

## 2022-01-14 DIAGNOSIS — Z1152 Encounter for screening for COVID-19: Secondary | ICD-10-CM | POA: Diagnosis not present

## 2022-01-16 ENCOUNTER — Ambulatory Visit: Payer: Medicaid Other | Admitting: Family Medicine

## 2022-01-17 ENCOUNTER — Encounter (INDEPENDENT_AMBULATORY_CARE_PROVIDER_SITE_OTHER): Payer: Self-pay | Admitting: Family Medicine

## 2022-01-18 ENCOUNTER — Encounter (INDEPENDENT_AMBULATORY_CARE_PROVIDER_SITE_OTHER): Payer: Self-pay

## 2022-01-20 NOTE — Progress Notes (Unsigned)
Chief Complaint:   OBESITY Karen Brennan is here to discuss her progress with her obesity treatment plan along with follow-up of her obesity related diagnoses. [name] is on the Category 2 Plan with breakfast and lunch options and states she is following her eating plan approximately 80% of the time. [name] states she is walking, biking, and weight training 30 minutes 3 times per week.  Today's visit was #: 16 Starting weight: 443 lbs Starting date: 02/01/2022 Today's weight: 396 lbs Today's date: 01/12/2022 Total lbs lost to date: 47 Total lbs lost since last in-office visit: 5  Interim History: [name] reports she "just ate better." She had more vegetables with proteins. She would have a protein shake if she was not hungry. Pt denies issues with meal plan. Most of the time, she is unable to eat all protein in one sitting and tries to eat the rest later.  Subjective:   1. Type 2 diabetes mellitus without complication, without long-term current use of insulin (HCC) Pt desires to increase Ozempic. It helps pt with portion control and she is able to stay on plan more. Pt denies skipping meals.  2. B12 deficiency Pt's B12 was 435 on 12/05/2021. She is taking OTC B12 1000 mcg daily.  3. Abdominal pain, unspecified abdominal location Rosely was recently seen in an UC on 12/31/21 for abdominal pain. This was 2 weeks after she had increased dose of Ozempic and workup was negative. Symptoms thought to be related to gaseous pains. Pt denies pains since then and has been taking Ozempic since.  4. Vitamin D deficiency She is currently taking prescription vitamin D 50,000 IU twice a week. She denies nausea, vomiting or muscle weakness.  5. Other constipation Pt is on ly on Linzess now. She has no need for Miralax. She has daily bowel movements and has no issues or concerns.  Assessment/Plan:   Orders Placed This Encounter  Procedures   Vitamin B12   VITAMIN D 25 Hydroxy (Vit-D Deficiency, Fractures)    Hemoglobin A1c   Insulin, random    Medications Discontinued During This Encounter  Medication Reason   Vitamin D, Ergocalciferol, (DRISDOL) 1.25 MG (50000 UNIT) CAPS capsule Reorder   metFORMIN (GLUCOPHAGE) 1000 MG tablet Reorder   linaclotide (LINZESS) 72 MCG capsule Reorder   Prenatal Vit-Fe Fum-FA-Omega (PNV PRENATAL PLUS MULTIVIT+DHA) 27-1 & 312 MG MISC Reorder     Meds ordered this encounter  Medications   metFORMIN (GLUCOPHAGE) 1000 MG tablet    Sig: Take 1 tablet (1,000 mg total) by mouth 2 (two) times daily with a meal.    Dispense:  60 tablet    Refill:  0   Vitamin D, Ergocalciferol, (DRISDOL) 1.25 MG (50000 UNIT) CAPS capsule    Sig: 1 tab q wed and 1 tab q sun    Dispense:  24 capsule    Refill:  0    90 d supply;  ** OV for RF **   Do not send RF request   linaclotide (LINZESS) 72 MCG capsule    Sig: Take 1 capsule (72 mcg total) by mouth daily before breakfast.    Dispense:  30 capsule    Refill:  0   Prenatal Vit-Fe Fum-FA-Omega (PNV PRENATAL PLUS MULTIVIT+DHA) 27-1 & 312 MG MISC    Sig: 1 po qd    Dispense:  30 each    Refill:  0   Semaglutide, 1 MG/DOSE, 4 MG/3ML SOPN    Sig: Inject 1 mg as directed once  a week.    Dispense:  3 mL    Refill:  0     1. Type 2 diabetes mellitus without complication, without long-term current use of insulin (HCC) Good blood sugar control is important to decrease the likelihood of diabetic complications such as nephropathy, neuropathy, limb loss, blindness, coronary artery disease, and death. Intensive lifestyle modification including diet, exercise and weight loss are the first line of treatment for diabetes.  Increase Ozempic to 1 mg weekly. Continue Metformin.   Refill- metFORMIN (GLUCOPHAGE) 1000 MG tablet; Take 1 tablet (1,000 mg total) by mouth 2 (two) times daily with a meal.  Dispense: 60 tablet; Refill: 0 Increase & Refill- Semaglutide, 1 MG/DOSE, 4 MG/3ML SOPN; Inject 1 mg as directed once a week.  Dispense: 3 mL;  Refill: 0  Lab/Orders today: - Vitamin B12 - Hemoglobin A1c - Insulin, random  2. B12 deficiency The diagnosis was reviewed with the patient. Counseling provided today, see below. We will continue to monitor. Orders and follow up as documented in patient record. Continue current treatment plan.  Counseling The body needs vitamin B12: to make red blood cells; to make DNA; and to help the nerves work properly so they can carry messages from the brain to the body.  The main causes of vitamin B12 deficiency include dietary deficiency, digestive diseases, pernicious anemia, and having a surgery in which part of the stomach or small intestine is removed.  Certain medicines can make it harder for the body to absorb vitamin B12. These medicines include: heartburn medications; some antibiotics; some medications used to treat diabetes, gout, and high cholesterol.  In some cases, there are no symptoms of this condition. If the condition leads to anemia or nerve damage, various symptoms can occur, such as weakness or fatigue, shortness of breath, and numbness or tingling in your hands and feet.   Treatment:  May include taking vitamin B12 supplements.  Avoid alcohol.  Eat lots of healthy foods that contain vitamin B12: Beef, pork, chicken, [place], and organ meats, such as liver.  Seafood: This includes clams, rainbow trout, salmon, tuna, and haddock. Eggs.  Cereal and dairy products that are fortified: This means that vitamin B12 has been added to the food.   Lab/Orders today: - Vitamin B12  3. Abdominal pain, unspecified abdominal location Symptoms resolved. UC notes and labs reviewed. Follow up with PCP if symptoms return. Pt has been feeling fine and symptoms were not related to Ozempic.  4. Vitamin D deficiency Low Vitamin D level contributes to fatigue and are associated with obesity, breast, and colon cancer. She agrees to continue to take prescription Vitamin D @50 ,000 IU twice a week and  will follow-up for routine testing of Vitamin D, at least 2-3 times per year to avoid over-replacement.  Refill- Vitamin D, Ergocalciferol, (DRISDOL) 1.25 MG (50000 UNIT) CAPS capsule; 1 tab q wed and 1 tab q sun  Dispense: 24 capsule; Refill: 0 Refill- Prenatal Vit-Fe Fum-FA-Omega (PNV PRENATAL PLUS MULTIVIT+DHA) 27-1 & 312 MG MISC; 1 po qd  Dispense: 30 each; Refill: 0  Lab/Orders today: - VITAMIN D 25 Hydroxy (Vit-D Deficiency, Fractures)  5. Other constipation Sumiya was informed that a decrease in bowel movement frequency is normal while losing weight, but stools should not be hard or painful. Orders and follow up as documented in patient record. Increase water intake.  Counseling Getting to Good Bowel Health: Your goal is to have one soft bowel movement each day. Drink at least 8 glasses of water  each day. Eat plenty of fiber (goal is over 25 grams each day). It is best to get most of your fiber from dietary sources which includes leafy green vegetables, fresh fruit, and whole grains. You may need to add fiber with the help of OTC fiber supplements. These include Metamucil, Citrucel, and Flaxseed. If you are still having trouble, try adding Miralax or Magnesium Citrate. If all of these changes do not work, Dietitian.  Refill- linaclotide (LINZESS) 72 MCG capsule; Take 1 capsule (72 mcg total) by mouth daily before breakfast.  Dispense: 30 capsule; Refill: 0  6. Obesity, current BMI 52.3 Karen Brennan is currently in the action stage of change. As such, her goal is to continue with weight loss efforts. She has agreed to the Category 2 Plan with breakfast and lunch options.   Repeat IC at next OV, as her last one was over a year ago.  Exercise goals:  As is  Behavioral modification strategies: increasing lean protein intake, decreasing simple carbohydrates, and no skipping meals.  Zarriah has agreed to follow-up with our clinic in 3 weeks. She was informed of the importance of frequent  follow-up visits to maximize her success with intensive lifestyle modifications for her multiple health conditions.   Christe was informed we would discuss her lab results at her next visit unless there is a critical issue that needs to be addressed sooner. Charlita agreed to keep her next visit at the agreed upon time to discuss these results.  Objective:   Blood pressure 134/84, pulse 82, temperature 98.2 F (36.8 C), height 6\' 1"  (1.854 m), weight (!) 396 lb (179.6 kg), SpO2 100 %. Body mass index is 52.25 kg/m.  General: Cooperative, alert, well developed, in no acute distress. HEENT: Conjunctivae and lids unremarkable. Cardiovascular: Regular rhythm.  Lungs: Normal work of breathing. Neurologic: No focal deficits.   Lab Results  Component Value Date   CREATININE 0.85 12/31/2021   BUN 7 12/31/2021   NA 137 12/31/2021   K 3.9 12/31/2021   CL 101 12/31/2021   CO2 27 12/31/2021   Lab Results  Component Value Date   ALT 20 12/31/2021   AST 23 12/31/2021   ALKPHOS 50 12/31/2021   BILITOT 0.5 12/31/2021   Lab Results  Component Value Date   HGBA1C 6.0 (H) 01/12/2022   HGBA1C 6.0 (H) 10/05/2021   HGBA1C 5.8 (H) 06/20/2021   HGBA1C 6.7 (H) 02/01/2021   HGBA1C 6.2 02/12/2020   Lab Results  Component Value Date   INSULIN 11.0 01/12/2022   INSULIN 22.7 02/01/2021   Lab Results  Component Value Date   TSH 1.090 09/06/2021   Lab Results  Component Value Date   CHOL 189 12/05/2021   HDL 61 12/05/2021   LDLCALC 107 (H) 12/05/2021   TRIG 119 12/05/2021   CHOLHDL 3.1 12/05/2021   Lab Results  Component Value Date   VD25OH 46.1 01/12/2022   VD25OH 37.2 10/05/2021   VD25OH 22.9 (L) 06/20/2021   Lab Results  Component Value Date   WBC 6.2 12/31/2021   HGB 12.8 12/31/2021   HCT 40.5 12/31/2021   MCV 86.9 12/31/2021   PLT 364 12/31/2021   Lab Results  Component Value Date   IRON 30 06/20/2021   TIBC 444 06/20/2021   FERRITIN 8 (L) 06/20/2021    Attestation  Statements:   Reviewed by clinician on day of visit: allergies, medications, problem list, medical history, surgical history, family history, social history, and previous encounter notes.  Time  spent on visit including pre-visit chart review and post-visit care and charting was 40 minutes.   I, Kyung Rudd, BS, CMA, am acting as transcriptionist for [employer], DO.   I have reviewed the above documentation for accuracy and completeness, and I agree with the above. [name], D.O.  The 21st Century Cures Act was signed into law in 2016 which includes the topic of electronic health records.  This provides immediate access to information in MyChart.  This includes consultation notes, operative notes, office notes, lab results and pathology reports.  If you have any questions about what you read please let us know at your next visit so we can discuss your concerns and take corrective action if need be.  We are right here with you.

## 2022-01-27 DIAGNOSIS — M25519 Pain in unspecified shoulder: Secondary | ICD-10-CM | POA: Diagnosis not present

## 2022-01-30 DIAGNOSIS — M25519 Pain in unspecified shoulder: Secondary | ICD-10-CM | POA: Diagnosis not present

## 2022-01-31 DIAGNOSIS — M25519 Pain in unspecified shoulder: Secondary | ICD-10-CM | POA: Diagnosis not present

## 2022-02-01 ENCOUNTER — Other Ambulatory Visit (INDEPENDENT_AMBULATORY_CARE_PROVIDER_SITE_OTHER): Payer: Self-pay | Admitting: Family Medicine

## 2022-02-01 ENCOUNTER — Other Ambulatory Visit: Payer: Self-pay | Admitting: Physician Assistant

## 2022-02-01 ENCOUNTER — Ambulatory Visit (INDEPENDENT_AMBULATORY_CARE_PROVIDER_SITE_OTHER): Payer: Medicaid Other | Admitting: Family Medicine

## 2022-02-01 ENCOUNTER — Encounter: Payer: Self-pay | Admitting: Family Medicine

## 2022-02-01 VITALS — BP 122/73 | HR 94 | Ht 73.0 in | Wt 393.5 lb

## 2022-02-01 DIAGNOSIS — I152 Hypertension secondary to endocrine disorders: Secondary | ICD-10-CM | POA: Diagnosis not present

## 2022-02-01 DIAGNOSIS — E1159 Type 2 diabetes mellitus with other circulatory complications: Secondary | ICD-10-CM

## 2022-02-01 DIAGNOSIS — E559 Vitamin D deficiency, unspecified: Secondary | ICD-10-CM

## 2022-02-01 DIAGNOSIS — L304 Erythema intertrigo: Secondary | ICD-10-CM

## 2022-02-01 DIAGNOSIS — M25519 Pain in unspecified shoulder: Secondary | ICD-10-CM | POA: Diagnosis not present

## 2022-02-01 DIAGNOSIS — J302 Other seasonal allergic rhinitis: Secondary | ICD-10-CM | POA: Diagnosis not present

## 2022-02-01 MED ORDER — NYSTATIN 100000 UNIT/GM EX POWD: 1.0000 | Freq: Three times a day (TID) | CUTANEOUS | 0 refills | Status: DC

## 2022-02-01 MED ORDER — LORATADINE 10 MG PO TABS: 10.0000 mg | ORAL_TABLET | Freq: Every day | ORAL | 0 refills | Status: DC

## 2022-02-01 MED ORDER — FLUTICASONE PROPIONATE 50 MCG/ACT NA SUSP: 2.0000 | Freq: Every day | NASAL | 0 refills | Status: DC

## 2022-02-01 NOTE — Assessment & Plan Note (Signed)
Rash seems consistent with intertrigo, unsuppressed by lack of improvement with prior clobetasol use in this area.  Does not appear to be consistent with any type of hidradenitis suppurativa. - Nystatin powder 3 times daily - Keep areas dry and free of moisture - Change to prevent moisture buildup

## 2022-02-01 NOTE — Patient Instructions (Signed)
I sent in refills for your Flonase, loratadine.  For the spots underneath your breasts and stomach it looks like yeast infection.  The cream you are using before can sometimes make this worse so I am going to resend in for nystatin powder.  You can use this multiple times a day, you do not want it to clump up too much.  If you find that it is not working as well we can always switch to the nystatin cream or ointment as well.  You can also use something called Zeasorb which is over-the-counter to can help decrease the moisture in those areas as well.  It would be very important to keep the areas dry, especially as we are in a very hot time of the year that will help with improving the rash the most.

## 2022-02-01 NOTE — Assessment & Plan Note (Addendum)
BP well controlled at 122/73 today. - Continue HCTZ 25 mg daily - Continue Lopressor 12.5 mg twice daily - Continue valsartan 80 mg daily

## 2022-02-01 NOTE — Progress Notes (Signed)
    SUBJECTIVE:   CHIEF COMPLAINT / HPI:   Rash - Under breasts and stomach - Has been present for some time - Worse in the summers - Has used clobetasol before without improvement.   Medication check in - refills already sent in for ozempic - Is currently on 1 mg of Ozempic weekly and tolerating well - needs flonase and loratidine  PERTINENT  PMH / PSH: Reviewed  OBJECTIVE:   BP 122/73   Pulse 94   Ht 6\' 1"  (1.854 m)   Wt (!) 393 lb 8 oz (178.5 kg)   SpO2 98%   BMI 51.92 kg/m   General: NAD, well-appearing, well-nourished Respiratory: No respiratory distress, breathing comfortably, able to speak in full sentences Skin: warm and dry, pruritic plaques present underneath bilateral breasts and on any skin folds of the abdomen, some mild fissures without bleeding noted as well. Psych: Appropriate affect and mood   ASSESSMENT/PLAN:   Yeast infection  intertrigo Rash seems consistent with intertrigo, unsuppressed by lack of improvement with prior clobetasol use in this area.  Does not appear to be consistent with any type of hidradenitis suppurativa. - Nystatin powder 3 times daily - Keep areas dry and free of moisture - Change to prevent moisture buildup  Hypertension BP well controlled at 122/73 today. - Continue HCTZ 25 mg daily - Continue Lopressor 12.5 mg twice daily - Continue valsartan 80 mg daily  Medication refills - Loratadine refills - Flonase refill sent   , DO [facility]

## 2022-02-02 DIAGNOSIS — M25519 Pain in unspecified shoulder: Secondary | ICD-10-CM | POA: Diagnosis not present

## 2022-02-02 MED ORDER — ALBUTEROL SULFATE HFA 108 (90 BASE) MCG/ACT IN AERS: 2.0000 | INHALATION_SPRAY | RESPIRATORY_TRACT | 0 refills | Status: DC | PRN

## 2022-02-02 NOTE — Telephone Encounter (Signed)
Pt calling back for an update on another CPAP machine. Please advise

## 2022-02-03 ENCOUNTER — Other Ambulatory Visit: Payer: Self-pay | Admitting: Family Medicine

## 2022-02-03 DIAGNOSIS — J302 Other seasonal allergic rhinitis: Secondary | ICD-10-CM

## 2022-02-03 DIAGNOSIS — M1711 Unilateral primary osteoarthritis, right knee: Secondary | ICD-10-CM | POA: Diagnosis not present

## 2022-02-06 DIAGNOSIS — M1711 Unilateral primary osteoarthritis, right knee: Secondary | ICD-10-CM | POA: Diagnosis not present

## 2022-02-07 ENCOUNTER — Telehealth: Payer: Self-pay | Admitting: Family Medicine

## 2022-02-07 DIAGNOSIS — Z1152 Encounter for screening for COVID-19: Secondary | ICD-10-CM | POA: Diagnosis not present

## 2022-02-07 DIAGNOSIS — M1711 Unilateral primary osteoarthritis, right knee: Secondary | ICD-10-CM | POA: Diagnosis not present

## 2022-02-07 NOTE — Telephone Encounter (Signed)
**  After Hours/ Emergency Line Call**  Received after hours call from Dawna Part. Attempted to call x1 with no answer.  Called again and was able to reach patient. She was calling in regard to her granddaughter, Arvilla Market. See [id number] chart for documentation.   Maury Dus, MD PGY-3, [facility] 02/07/2022 8:04 PM

## 2022-02-08 ENCOUNTER — Other Ambulatory Visit (INDEPENDENT_AMBULATORY_CARE_PROVIDER_SITE_OTHER): Payer: Self-pay | Admitting: Family Medicine

## 2022-02-08 ENCOUNTER — Other Ambulatory Visit: Payer: Self-pay

## 2022-02-08 ENCOUNTER — Emergency Department (HOSPITAL_COMMUNITY)
Admission: EM | Admit: 2022-02-08 | Discharge: 2022-02-08 | Disposition: A | Discharge status: Home / Self Care | Payer: Medicaid Other | Attending: Emergency Medicine | Admitting: Emergency Medicine

## 2022-02-08 ENCOUNTER — Encounter (HOSPITAL_COMMUNITY): Payer: Self-pay

## 2022-02-08 DIAGNOSIS — Z794 Long term (current) use of insulin: Secondary | ICD-10-CM | POA: Insufficient documentation

## 2022-02-08 DIAGNOSIS — M1711 Unilateral primary osteoarthritis, right knee: Secondary | ICD-10-CM | POA: Diagnosis not present

## 2022-02-08 DIAGNOSIS — Y92481 Parking lot as the place of occurrence of the external cause: Secondary | ICD-10-CM | POA: Insufficient documentation

## 2022-02-08 DIAGNOSIS — M542 Cervicalgia: Secondary | ICD-10-CM | POA: Insufficient documentation

## 2022-02-08 DIAGNOSIS — E119 Type 2 diabetes mellitus without complications: Secondary | ICD-10-CM

## 2022-02-08 DIAGNOSIS — K5909 Other constipation: Secondary | ICD-10-CM

## 2022-02-08 MED ORDER — NAPROXEN 500 MG PO TABS: 500.0000 mg | ORAL_TABLET | Freq: Two times a day (BID) | ORAL | 0 refills | Status: DC

## 2022-02-08 MED ORDER — IBUPROFEN 800 MG PO TABS
800.0000 mg | ORAL_TABLET | Freq: Once | ORAL | Status: AC
Start: 2022-02-08 — End: 2022-02-08
  Administered 2022-02-08: 800 mg via ORAL
  Filled 2022-02-08: qty 1

## 2022-02-08 MED ORDER — METHOCARBAMOL 500 MG PO TABS: 500.0000 mg | ORAL_TABLET | Freq: Two times a day (BID) | ORAL | 0 refills | Status: DC
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
## 2022-02-09 DIAGNOSIS — M1711 Unilateral primary osteoarthritis, right knee: Secondary | ICD-10-CM | POA: Diagnosis not present

## 2022-02-10 ENCOUNTER — Other Ambulatory Visit: Payer: Self-pay

## 2022-02-10 DIAGNOSIS — M1711 Unilateral primary osteoarthritis, right knee: Secondary | ICD-10-CM | POA: Diagnosis not present

## 2022-02-10 MED ORDER — METOPROLOL TARTRATE 25 MG PO TABS: 12.5000 mg | ORAL_TABLET | Freq: Two times a day (BID) | ORAL | 0 refills | Status: DC | PRN

## 2022-02-13 DIAGNOSIS — M1711 Unilateral primary osteoarthritis, right knee: Secondary | ICD-10-CM | POA: Diagnosis not present

## 2022-02-13 DIAGNOSIS — Z1152 Encounter for screening for COVID-19: Secondary | ICD-10-CM | POA: Diagnosis not present

## 2022-02-14 DIAGNOSIS — M1711 Unilateral primary osteoarthritis, right knee: Secondary | ICD-10-CM | POA: Diagnosis not present

## 2022-02-15 ENCOUNTER — Encounter (INDEPENDENT_AMBULATORY_CARE_PROVIDER_SITE_OTHER): Payer: Self-pay | Admitting: Family Medicine

## 2022-02-15 ENCOUNTER — Ambulatory Visit (INDEPENDENT_AMBULATORY_CARE_PROVIDER_SITE_OTHER): Payer: Medicaid Other | Admitting: Family Medicine

## 2022-02-15 VITALS — BP 122/84 | HR 91 | Temp 98.4°F | Ht 73.0 in | Wt 389.0 lb

## 2022-02-15 DIAGNOSIS — D508 Other iron deficiency anemias: Secondary | ICD-10-CM

## 2022-02-15 DIAGNOSIS — M1711 Unilateral primary osteoarthritis, right knee: Secondary | ICD-10-CM | POA: Diagnosis not present

## 2022-02-15 DIAGNOSIS — Z6841 Body Mass Index (BMI) 40.0 and over, adult: Secondary | ICD-10-CM | POA: Diagnosis not present

## 2022-02-15 DIAGNOSIS — Z7985 Long-term (current) use of injectable non-insulin antidiabetic drugs: Secondary | ICD-10-CM | POA: Diagnosis not present

## 2022-02-15 DIAGNOSIS — E119 Type 2 diabetes mellitus without complications: Secondary | ICD-10-CM | POA: Diagnosis not present

## 2022-02-15 DIAGNOSIS — R0602 Shortness of breath: Secondary | ICD-10-CM

## 2022-02-15 DIAGNOSIS — E559 Vitamin D deficiency, unspecified: Secondary | ICD-10-CM | POA: Diagnosis not present

## 2022-02-15 DIAGNOSIS — Z7984 Long term (current) use of oral hypoglycemic drugs: Secondary | ICD-10-CM

## 2022-02-15 DIAGNOSIS — K5909 Other constipation: Secondary | ICD-10-CM | POA: Diagnosis not present

## 2022-02-15 DIAGNOSIS — E669 Obesity, unspecified: Secondary | ICD-10-CM | POA: Diagnosis not present

## 2022-02-15 DIAGNOSIS — E538 Deficiency of other specified B group vitamins: Secondary | ICD-10-CM

## 2022-02-15 MED ORDER — LINACLOTIDE 72 MCG PO CAPS: 72.0000 ug | ORAL_CAPSULE | Freq: Every day | ORAL | 0 refills | Status: DC

## 2022-02-15 MED ORDER — VITAMIN D (ERGOCALCIFEROL) 1.25 MG (50000 UNIT) PO CAPS: ORAL_CAPSULE | ORAL | 0 refills | Status: DC

## 2022-02-15 MED ORDER — PNV PRENATAL PLUS MULTIVIT+DHA 27-1 & 312 MG PO MISC: ORAL | 0 refills | Status: DC

## 2022-02-15 MED ORDER — METFORMIN HCL 1000 MG PO TABS: 1000.0000 mg | ORAL_TABLET | Freq: Two times a day (BID) | ORAL | 0 refills | Status: DC

## 2022-02-15 MED ORDER — SEMAGLUTIDE (1 MG/DOSE) 4 MG/3ML ~~LOC~~ SOPN
1.0000 mg | PEN_INJECTOR | SUBCUTANEOUS | 0 refills | Status: DC
Start: 2022-02-15 — End: 2022-03-13

## 2022-02-16 DIAGNOSIS — M1711 Unilateral primary osteoarthritis, right knee: Secondary | ICD-10-CM | POA: Diagnosis not present

## 2022-02-17 DIAGNOSIS — M1711 Unilateral primary osteoarthritis, right knee: Secondary | ICD-10-CM | POA: Diagnosis not present

## 2022-02-20 DIAGNOSIS — M1711 Unilateral primary osteoarthritis, right knee: Secondary | ICD-10-CM | POA: Diagnosis not present

## 2022-02-21 DIAGNOSIS — M1711 Unilateral primary osteoarthritis, right knee: Secondary | ICD-10-CM | POA: Diagnosis not present

## 2022-02-22 DIAGNOSIS — M1711 Unilateral primary osteoarthritis, right knee: Secondary | ICD-10-CM | POA: Diagnosis not present

## 2022-02-22 NOTE — Progress Notes (Signed)
Chief Complaint:   OBESITY Karen Brennan is here to discuss her progress with her obesity treatment plan along with follow-up of her obesity related diagnoses. Karen Brennan is on the Category 2 Plan with breakfast and lunch options and states she is following her eating plan approximately 80% of the time. Karen Brennan states she is doing foot bike, walking, and weights 30 minutes 3 times per week.  Today's visit was #: 17 Starting weight: 443 lbs Starting date: 02/01/2022 Today's weight: 389 lbs Today's date: 02/15/2022 Total lbs lost to date: 54 Total lbs lost since last in-office visit: 7  Interim History: Karen Brennan reports Ozempic is helping a lot with portion control and has decreased hunger. She denies GI side effects or constipation.she is drinking approximately 5 bottles of water per day. Review labs and repeat IC today.  Subjective:   1. Type 2 diabetes mellitus without complication, without long-term current use of insulin (HCC) Discussed labs with patient today. We increased Ozempic at pt's last OV and she is tolerating it well without side effects. Pt lost a great amount of weight- lost 4 lbs in fat mass. She has had no abnormal low blood sugars or new symptoms.  2. Other constipation Discussed labs with patient today. Karen Brennan denies symptoms or concerns. Medication: Linzess daily or every other day.  3. Vitamin D deficiency Discussed labs with patient today. She is currently taking prescription vitamin D 50,000 IU twice a week. She denies nausea, vomiting or muscle weakness.  4. B12 deficiency Discussed labs with patient today. Pt is taking an OTC B12 supplement daily.  5. SOB (shortness of breath) on exertion Discussed labs with patient today. Karen Brennan notes increasing shortness of breath with exercising and seems to be worsening over time with weight gain. She notes getting out of breath sooner with activity than she used to. This has gotten worse recently. Karen Brennan denies shortness of breath at  rest or orthopnea.  6. Other iron deficiency anemia Discussed labs with patient today. Pt is taking a prenatal multivitamin daily.   Assessment/Plan:  No orders of the defined types were placed in this encounter.   Medications Discontinued During This Encounter  Medication Reason   Semaglutide,0.25 or 0.5MG /DOS, (OZEMPIC, 0.25 OR 0.5 MG/DOSE,) 2 MG/3ML SOPN    metFORMIN (GLUCOPHAGE) 1000 MG tablet Reorder   Vitamin D, Ergocalciferol, (DRISDOL) 1.25 MG (50000 UNIT) CAPS capsule Reorder   linaclotide (LINZESS) 72 MCG capsule Reorder   Prenatal Vit-Fe Fum-FA-Omega (PNV PRENATAL PLUS MULTIVIT+DHA) 27-1 & 312 MG MISC Reorder   Semaglutide, 1 MG/DOSE, 4 MG/3ML SOPN Reorder     Meds ordered this encounter  Medications   metFORMIN (GLUCOPHAGE) 1000 MG tablet    Sig: Take 1 tablet (1,000 mg total) by mouth 2 (two) times daily with a meal.    Dispense:  60 tablet    Refill:  0   Semaglutide, 1 MG/DOSE, 4 MG/3ML SOPN    Sig: Inject 1 mg as directed once a week.    Dispense:  3 mL    Refill:  0   Vitamin D, Ergocalciferol, (DRISDOL) 1.25 MG (50000 UNIT) CAPS capsule    Sig: 1 tab q wed and 1 tab q sun    Dispense:  24 capsule    Refill:  0    90 d supply;  ** OV for RF **   Do not send RF request   linaclotide (LINZESS) 72 MCG capsule    Sig: Take 1 capsule (72 mcg total) by mouth daily  before breakfast.    Dispense:  30 capsule    Refill:  0   Prenatal Vit-Fe Fum-FA-Omega (PNV PRENATAL PLUS MULTIVIT+DHA) 27-1 & 312 MG MISC    Sig: 1 po qd    Dispense:  30 each    Refill:  0     1. Type 2 diabetes mellitus without complication, without long-term current use of insulin (HCC) Fasting insulin decreased and A1c is still at 6.0. Good blood sugar control is important to decrease the likelihood of diabetic complications such as nephropathy, neuropathy, limb loss, blindness, coronary artery disease, and death. Intensive lifestyle modification including diet, exercise and weight loss are the  first line of treatment for diabetes. No change in dose.  Refill- metFORMIN (GLUCOPHAGE) 1000 MG tablet; Take 1 tablet (1,000 mg total) by mouth 2 (two) times daily with a meal.  Dispense: 60 tablet; Refill: 0 Refill- Semaglutide, 1 MG/DOSE, 4 MG/3ML SOPN; Inject 1 mg as directed once a week.  Dispense: 3 mL; Refill: 0 Refill- linaclotide (LINZESS) 72 MCG capsule; Take 1 capsule (72 mcg total) by mouth daily before breakfast.  Dispense: 30 capsule; Refill: 0  2. Other constipation Lynzey was informed that a decrease in bowel movement frequency is normal while losing weight, but stools should not be hard or painful. Orders and follow up as documented in patient record.   Counseling Getting to Good Bowel Health: Your goal is to have one soft bowel movement each day. Drink at least 8 glasses of water each day. Eat plenty of fiber (goal is over 25 grams each day). It is best to get most of your fiber from dietary sources which includes leafy green vegetables, fresh fruit, and whole grains. You may need to add fiber with the help of OTC fiber supplements. These include Metamucil, Citrucel, and Flaxseed. If you are still having trouble, try adding Miralax or Magnesium Citrate. If all of these changes do not work, Cabin crew.  Refill- linaclotide (LINZESS) 72 MCG capsule; Take 1 capsule (72 mcg total) by mouth daily before breakfast.  Dispense: 30 capsule; Refill: 0  3. Vitamin D deficiency Vit D is much improved at 46.1. Low Vitamin D level contributes to fatigue and are associated with obesity, breast, and colon cancer. She agrees to continue to take prescription Vitamin D @50 ,000 IU twice a week and will follow-up for routine testing of Vitamin D, at least 2-3 times per year to avoid over-replacement.  Refill- Vitamin D, Ergocalciferol, (DRISDOL) 1.25 MG (50000 UNIT) CAPS capsule; 1 tab q wed and 1 tab q sun  Dispense: 24 capsule; Refill: 0 Refill- Prenatal Vit-Fe Fum-FA-Omega (PNV PRENATAL  PLUS MULTIVIT+DHA) 27-1 & 312 MG MISC; 1 po qd  Dispense: 30 each; Refill: 0  4. B12 deficiency B12 at goal. The diagnosis was reviewed with the patient. Counseling provided today, see below. We will continue to monitor. Orders and follow up as documented in patient record. Continue B12 supplement.  Counseling The body needs vitamin B12: to make red blood cells; to make DNA; and to help the nerves work properly so they can carry messages from the brain to the body.  The main causes of vitamin B12 deficiency include dietary deficiency, digestive diseases, pernicious anemia, and having a surgery in which part of the stomach or small intestine is removed.  Certain medicines can make it harder for the body to absorb vitamin B12. These medicines include: heartburn medications; some antibiotics; some medications used to treat diabetes, gout, and high cholesterol.  In  some cases, there are no symptoms of this condition. If the condition leads to anemia or nerve damage, various symptoms can occur, such as weakness or fatigue, shortness of breath, and numbness or tingling in your hands and feet.   Treatment:  May include taking vitamin B12 supplements.  Avoid alcohol.  Eat lots of healthy foods that contain vitamin B12: Beef, pork, chicken, [place], and organ meats, such as liver.  Seafood: This includes clams, rainbow trout, salmon, tuna, and haddock. Eggs.  Cereal and dairy products that are fortified: This means that vitamin B12 has been added to the food.   5. SOB (shortness of breath) on exertion Carol's IC increased by 1000 from prior. It was 1166 and is now 2261. We need to increase to category 3. Willis does feel that she gets out of breath more easily that she used to when she exercises. Karen Brennan's shortness of breath appears to be obesity related and exercise induced. She has agreed to work on weight loss and gradually increase exercise to treat her exercise induced shortness of breath. Will continue  to monitor closely.  6. Other iron deficiency anemia CBC from 12/31/2021 was finally within normal limits. Orders and follow up as documented in patient record. Continue prudent nutritional plan, prenatal vitamin.  Counseling Iron is essential for our bodies to make red blood cells.  Reasons that someone may be deficient include: an iron-deficient diet (more likely in those following vegan or vegetarian diets), women with heavy menses, patients with GI disorders or poor absorption, patients that have had bariatric surgery, frequent blood donors, patients with cancer, and patients with heart disease.   Iron-rich foods include dark leafy greens, red and white meats, eggs, seafood, and beans.   Certain foods and drinks prevent your body from absorbing iron properly. Avoid eating these foods in the same meal as iron-rich foods or with iron supplements. These foods include: coffee, black tea, and red wine; milk, dairy products, and foods that are high in calcium; beans and soybeans; whole grains.  Constipation can be a side effect of iron supplementation. Increased water and fiber intake are helpful. Water goal: > 2 liters/day. Fiber goal: > 25 grams/day.  7. Obesity, current BMI 51.4 Karen Brennan is currently in the action stage of change. As such, her goal is to continue with weight loss efforts. She has agreed to change to the Category 3 Plan with breakfast and lunch options.   Goal is to increase protein intake and increase water.  Exercise goals:  As is and increase as tolerated  Behavioral modification strategies: increasing lean protein intake, decreasing simple carbohydrates, and no skipping meals.  Karen Brennan has agreed to follow-up with our clinic in 3 weeks. She was informed of the importance of frequent follow-up visits to maximize her success with intensive lifestyle modifications for her multiple health conditions.   Objective:   Blood pressure 122/84, pulse 91, temperature 98.4 F (36.9 C),  height 6\' 1"  (1.854 m), weight (!) 389 lb (176.4 kg), SpO2 100 %. Body mass index is 51.32 kg/m.  General: Cooperative, alert, well developed, in no acute distress. HEENT: Conjunctivae and lids unremarkable. Cardiovascular: Regular rhythm.  Lungs: Normal work of breathing. Neurologic: No focal deficits.   Lab Results  Component Value Date   CREATININE 0.85 12/31/2021   BUN 7 12/31/2021   NA 137 12/31/2021   K 3.9 12/31/2021   CL 101 12/31/2021   CO2 27 12/31/2021   Lab Results  Component Value Date   ALT 20  12/31/2021   AST 23 12/31/2021   ALKPHOS 50 12/31/2021   BILITOT 0.5 12/31/2021   Lab Results  Component Value Date   HGBA1C 6.0 (H) 01/12/2022   HGBA1C 6.0 (H) 10/05/2021   HGBA1C 5.8 (H) 06/20/2021   HGBA1C 6.7 (H) 02/01/2021   HGBA1C 6.2 02/12/2020   Lab Results  Component Value Date   INSULIN 11.0 01/12/2022   INSULIN 22.7 02/01/2021   Lab Results  Component Value Date   TSH 1.090 09/06/2021   Lab Results  Component Value Date   CHOL 189 12/05/2021   HDL 61 12/05/2021   LDLCALC 107 (H) 12/05/2021   TRIG 119 12/05/2021   CHOLHDL 3.1 12/05/2021   Lab Results  Component Value Date   VD25OH 46.1 01/12/2022   VD25OH 37.2 10/05/2021   VD25OH 22.9 (L) 06/20/2021   Lab Results  Component Value Date   WBC 6.2 12/31/2021   HGB 12.8 12/31/2021   HCT 40.5 12/31/2021   MCV 86.9 12/31/2021   PLT 364 12/31/2021   Lab Results  Component Value Date   IRON 30 06/20/2021   TIBC 444 06/20/2021   FERRITIN 8 (L) 06/20/2021    Attestation Statements:   Reviewed by clinician on day of visit: allergies, medications, problem list, medical history, surgical history, family history, social history, and previous encounter notes.  Time spent on visit including pre-visit chart review and post-visit care and charting was 40 minutes.   I, Kathlene November, BS, CMA, am acting as transcriptionist for [employer], DO.    I have reviewed the above documentation  for accuracy and completeness, and I agree with the above. Marjory Sneddon, D.O.  The [place] was signed into law in 2016 which includes the topic of electronic health records.  This provides immediate access to information in MyChart.  This includes consultation notes, operative notes, office notes, lab results and pathology reports.  If you have any questions about what you read please let us know at your next visit so we can discuss your concerns and take corrective action if need be.  We are right here with you.

## 2022-02-23 ENCOUNTER — Telehealth: Payer: Self-pay | Admitting: *Deleted

## 2022-02-23 ENCOUNTER — Telehealth: Payer: Medicaid Other | Admitting: Cardiology

## 2022-02-23 DIAGNOSIS — M1711 Unilateral primary osteoarthritis, right knee: Secondary | ICD-10-CM | POA: Diagnosis not present

## 2022-02-23 NOTE — Telephone Encounter (Signed)
Called patient to inform her that per Cyndra Numbers with [facility]  she will need a office visit to document CPAP failure prior to ordering another sleep study. Patient has appointment with Dr Sharee Holster on 03/13/22. She was informed if Dr Sharee Holster will document the CPAP failure and that she still has symptoms and another sleep study has to be ordered per insurance guidelines we can use her note. If not, patient will need to have appointment with either Dr Tresa Endo or Dr Bjorn Pippin for documentation. Patient voiced her understanding and will call me back after her appointment to let me know how I need to proceed.

## 2022-02-23 NOTE — Telephone Encounter (Signed)
Patient called stating she need c-pap machine she states she needs a new prescription for it.  She states this is her third phone call about this.

## 2022-02-23 NOTE — Telephone Encounter (Signed)
See 02/23/22 phone notes.

## 2022-02-23 NOTE — Telephone Encounter (Signed)
Returned a call to the patient. She states that her CPAP machine was taken back by [facility] due to non compliance. They informed her that she has to start over with another sleep study. Patient informed that I will get her a study scheduled and the lab will reach out to her for scheduling. She was also informed she may need a office appointment to document this. Patient voiced understanding.

## 2022-02-24 DIAGNOSIS — M1711 Unilateral primary osteoarthritis, right knee: Secondary | ICD-10-CM | POA: Diagnosis not present

## 2022-02-27 DIAGNOSIS — M1711 Unilateral primary osteoarthritis, right knee: Secondary | ICD-10-CM | POA: Diagnosis not present

## 2022-02-28 DIAGNOSIS — M1711 Unilateral primary osteoarthritis, right knee: Secondary | ICD-10-CM | POA: Diagnosis not present

## 2022-03-01 ENCOUNTER — Other Ambulatory Visit (INDEPENDENT_AMBULATORY_CARE_PROVIDER_SITE_OTHER): Payer: Self-pay | Admitting: Family Medicine

## 2022-03-01 DIAGNOSIS — E119 Type 2 diabetes mellitus without complications: Secondary | ICD-10-CM

## 2022-03-01 DIAGNOSIS — E559 Vitamin D deficiency, unspecified: Secondary | ICD-10-CM

## 2022-03-01 DIAGNOSIS — M1711 Unilateral primary osteoarthritis, right knee: Secondary | ICD-10-CM | POA: Diagnosis not present

## 2022-03-01 DIAGNOSIS — K5909 Other constipation: Secondary | ICD-10-CM

## 2022-03-02 DIAGNOSIS — M1711 Unilateral primary osteoarthritis, right knee: Secondary | ICD-10-CM | POA: Diagnosis not present

## 2022-03-03 DIAGNOSIS — M1711 Unilateral primary osteoarthritis, right knee: Secondary | ICD-10-CM | POA: Diagnosis not present

## 2022-03-03 DIAGNOSIS — Z1152 Encounter for screening for COVID-19: Secondary | ICD-10-CM | POA: Diagnosis not present

## 2022-03-04 ENCOUNTER — Other Ambulatory Visit: Payer: Self-pay | Admitting: Family Medicine

## 2022-03-04 DIAGNOSIS — Z1152 Encounter for screening for COVID-19: Secondary | ICD-10-CM | POA: Diagnosis not present

## 2022-03-04 DIAGNOSIS — J302 Other seasonal allergic rhinitis: Secondary | ICD-10-CM

## 2022-03-06 DIAGNOSIS — M1711 Unilateral primary osteoarthritis, right knee: Secondary | ICD-10-CM | POA: Diagnosis not present

## 2022-03-07 DIAGNOSIS — M1711 Unilateral primary osteoarthritis, right knee: Secondary | ICD-10-CM | POA: Diagnosis not present

## 2022-03-08 DIAGNOSIS — M1711 Unilateral primary osteoarthritis, right knee: Secondary | ICD-10-CM | POA: Diagnosis not present

## 2022-03-09 DIAGNOSIS — Z1152 Encounter for screening for COVID-19: Secondary | ICD-10-CM | POA: Diagnosis not present

## 2022-03-09 DIAGNOSIS — M1711 Unilateral primary osteoarthritis, right knee: Secondary | ICD-10-CM | POA: Diagnosis not present

## 2022-03-10 DIAGNOSIS — M1711 Unilateral primary osteoarthritis, right knee: Secondary | ICD-10-CM | POA: Diagnosis not present

## 2022-03-13 ENCOUNTER — Ambulatory Visit (INDEPENDENT_AMBULATORY_CARE_PROVIDER_SITE_OTHER): Payer: Medicaid Other | Admitting: Family Medicine

## 2022-03-13 ENCOUNTER — Encounter (INDEPENDENT_AMBULATORY_CARE_PROVIDER_SITE_OTHER): Payer: Self-pay | Admitting: Family Medicine

## 2022-03-13 VITALS — BP 132/84 | HR 99 | Temp 98.3°F | Ht 73.0 in | Wt 376.0 lb

## 2022-03-13 DIAGNOSIS — E1169 Type 2 diabetes mellitus with other specified complication: Secondary | ICD-10-CM

## 2022-03-13 DIAGNOSIS — Z6841 Body Mass Index (BMI) 40.0 and over, adult: Secondary | ICD-10-CM

## 2022-03-13 DIAGNOSIS — D508 Other iron deficiency anemias: Secondary | ICD-10-CM | POA: Diagnosis not present

## 2022-03-13 DIAGNOSIS — Z7984 Long term (current) use of oral hypoglycemic drugs: Secondary | ICD-10-CM

## 2022-03-13 DIAGNOSIS — Z7985 Long-term (current) use of injectable non-insulin antidiabetic drugs: Secondary | ICD-10-CM | POA: Diagnosis not present

## 2022-03-13 DIAGNOSIS — E559 Vitamin D deficiency, unspecified: Secondary | ICD-10-CM

## 2022-03-13 DIAGNOSIS — D649 Anemia, unspecified: Secondary | ICD-10-CM | POA: Insufficient documentation

## 2022-03-13 DIAGNOSIS — E669 Obesity, unspecified: Secondary | ICD-10-CM

## 2022-03-13 DIAGNOSIS — M1711 Unilateral primary osteoarthritis, right knee: Secondary | ICD-10-CM | POA: Diagnosis not present

## 2022-03-13 MED ORDER — PNV PRENATAL PLUS MULTIVIT+DHA 27-1 & 312 MG PO MISC: ORAL | 0 refills | Status: DC

## 2022-03-13 MED ORDER — VITAMIN D (ERGOCALCIFEROL) 1.25 MG (50000 UNIT) PO CAPS: ORAL_CAPSULE | ORAL | 0 refills | Status: DC

## 2022-03-13 MED ORDER — METFORMIN HCL 1000 MG PO TABS
1000.0000 mg | ORAL_TABLET | Freq: Two times a day (BID) | ORAL | 0 refills | Status: DC
Start: 2022-03-13 — End: 2022-04-10

## 2022-03-13 MED ORDER — SEMAGLUTIDE (1 MG/DOSE) 4 MG/3ML ~~LOC~~ SOPN
1.0000 mg | PEN_INJECTOR | SUBCUTANEOUS | 0 refills | Status: DC
Start: 2022-03-13 — End: 2022-04-10

## 2022-03-14 DIAGNOSIS — M1711 Unilateral primary osteoarthritis, right knee: Secondary | ICD-10-CM | POA: Diagnosis not present

## 2022-03-15 DIAGNOSIS — M1711 Unilateral primary osteoarthritis, right knee: Secondary | ICD-10-CM | POA: Diagnosis not present

## 2022-03-16 DIAGNOSIS — M1711 Unilateral primary osteoarthritis, right knee: Secondary | ICD-10-CM | POA: Diagnosis not present

## 2022-03-17 DIAGNOSIS — Z1152 Encounter for screening for COVID-19: Secondary | ICD-10-CM | POA: Diagnosis not present

## 2022-03-17 DIAGNOSIS — M25519 Pain in unspecified shoulder: Secondary | ICD-10-CM | POA: Diagnosis not present

## 2022-03-18 NOTE — Progress Notes (Unsigned)
Chief Complaint:   OBESITY Karen Brennan is here to discuss her progress with her obesity treatment plan along with follow-up of her obesity related diagnoses. Karen Brennan is on the Category 3 Plan with breakfast and lunch options and states she is following her eating plan approximately 80% of the time. Karen Brennan states she is walking, foot bike, and weights 30 minutes 3-4 times per week.  Today's visit was #: 18 Starting weight: 443 lbs Starting date: 02/01/2022 Today's weight: 376 lbs Today's date: 03/13/2022 Total lbs lost to date: 67 Total lbs lost since last in-office visit: 13  Interim History: Pt is focused on portion control , measuring and eating her proteins and vegetables. She is eating more food on plan and skipping less even though she may have to go back and finish at a later time.  Subjective:   1. Type 2 diabetes mellitus with obesity (HCC) Karen Brennan is not checking her blood sugars but has no concerns. Her A1c is 6.0. Medication: Metformin, Ozempic  2. Vitamin D deficiency Vit D level at goal at 46.1 about 2 months ago. Medication: Ergocalciferol  3. Other iron deficiency anemia Karen Brennan is not a vegetarian. She does not have a history of weight loss surgery. Medication: Prenatal vitamin  Assessment/Plan:  No orders of the defined types were placed in this encounter.   Medications Discontinued During This Encounter  Medication Reason   metFORMIN (GLUCOPHAGE) 1000 MG tablet Reorder   Semaglutide, 1 MG/DOSE, 4 MG/3ML SOPN Reorder   Vitamin D, Ergocalciferol, (DRISDOL) 1.25 MG (50000 UNIT) CAPS capsule Reorder   Prenatal Vit-Fe Fum-FA-Omega (PNV PRENATAL PLUS MULTIVIT+DHA) 27-1 & 312 MG MISC Reorder     Meds ordered this encounter  Medications   metFORMIN (GLUCOPHAGE) 1000 MG tablet    Sig: Take 1 tablet (1,000 mg total) by mouth 2 (two) times daily with a meal.    Dispense:  60 tablet    Refill:  0   Prenatal Vit-Fe Fum-FA-Omega (PNV PRENATAL PLUS MULTIVIT+DHA) 27-1 & 312 MG  MISC    Sig: 1 po qd    Dispense:  30 each    Refill:  0   Vitamin D, Ergocalciferol, (DRISDOL) 1.25 MG (50000 UNIT) CAPS capsule    Sig: 1 tab q wed and 1 tab q sun    Dispense:  24 capsule    Refill:  0    90 d supply;  ** OV for RF **   Do not send RF request   Semaglutide, 1 MG/DOSE, 4 MG/3ML SOPN    Sig: Inject 1 mg as directed once a week.    Dispense:  3 mL    Refill:  0     1. Type 2 diabetes mellitus with obesity (HCC) Good blood sugar control is important to decrease the likelihood of diabetic complications such as nephropathy, neuropathy, limb loss, blindness, coronary artery disease, and death. Intensive lifestyle modification including diet, exercise and weight loss are the first line of treatment for diabetes.   Refill- metFORMIN (GLUCOPHAGE) 1000 MG tablet; Take 1 tablet (1,000 mg total) by mouth 2 (two) times daily with a meal.  Dispense: 60 tablet; Refill: 0 Refill Semaglutide, 1 MG/DOSE, 4 MG/3ML SOPN; Inject 1 mg as directed once a week.  Dispense: 3 mL; Refill: 0  2. Vitamin D deficiency Low Vitamin D level contributes to fatigue and are associated with obesity, breast, and colon cancer. She agrees to continue to take prescription Vitamin D 50,000 IU twice a week and will follow-up  for routine testing of Vitamin D, at least 2-3 times per year to avoid over-replacement.  Refill- Vitamin D, Ergocalciferol, (DRISDOL) 1.25 MG (50000 UNIT) CAPS capsule; 1 tab q wed and 1 tab q sun  Dispense: 24 capsule; Refill: 0  3. Other iron deficiency anemia The diagnosis was reviewed with the patient. Counseling provided today, see below. We will continue to monitor. Orders and follow up as documented in patient record.  Counseling The body needs vitamin B12: to make red blood cells; to make DNA; and to help the nerves work properly so they can carry messages from the brain to the body.  The main causes of vitamin B12 deficiency include dietary deficiency, digestive diseases,  pernicious anemia, and having a surgery in which part of the stomach or small intestine is removed.  Certain medicines can make it harder for the body to absorb vitamin B12. These medicines include: heartburn medications; some antibiotics; some medications used to treat diabetes, gout, and high cholesterol.  In some cases, there are no symptoms of this condition. If the condition leads to anemia or nerve damage, various symptoms can occur, such as weakness or fatigue, shortness of breath, and numbness or tingling in your hands and feet.   Treatment:  May include taking vitamin B12 supplements.  Avoid alcohol.  Eat lots of healthy foods that contain vitamin B12: Beef, pork, chicken, [place], and organ meats, such as liver.  Seafood: This includes clams, rainbow trout, salmon, tuna, and haddock. Eggs.  Cereal and dairy products that are fortified: This means that vitamin B12 has been added to the food.   Refill- Prenatal Vit-Fe Fum-FA-Omega (PNV PRENATAL PLUS MULTIVIT+DHA) 27-1 & 312 MG MISC; 1 po qd  Dispense: 30 each; Refill: 0  4. Obesity, current BMI 49.6 Karen Brennan is currently in the action stage of change. As such, her goal is to continue with weight loss efforts. She has agreed to the Category 3 Plan with breakfast and lunch options.   Exercise goals: For substantial health benefits, adults should do at least 150 minutes (2 hours and 30 minutes) a week of moderate-intensity, or 75 minutes (1 hour and 15 minutes) a week of vigorous-intensity aerobic physical activity, or an equivalent combination of moderate- and vigorous-intensity aerobic activity. Aerobic activity should be performed in episodes of at least 10 minutes, and preferably, it should be spread throughout the week.  Behavioral modification strategies: increasing lean protein intake, no skipping meals, and avoiding temptations.  Karen Brennan has agreed to follow-up with our clinic in 3 weeks. She was informed of the importance of frequent  follow-up visits to maximize her success with intensive lifestyle modifications for her multiple health conditions.   Objective:   Blood pressure 132/84, pulse 99, temperature 98.3 F (36.8 C), height 6\' 1"  (1.854 m), weight (!) 376 lb (170.6 kg), SpO2 98 %. Body mass index is 49.61 kg/m.  General: Cooperative, alert, well developed, in no acute distress. HEENT: Conjunctivae and lids unremarkable. Cardiovascular: Regular rhythm.  Lungs: Normal work of breathing. Neurologic: No focal deficits.   Lab Results  Component Value Date   CREATININE 0.85 12/31/2021   BUN 7 12/31/2021   NA 137 12/31/2021   K 3.9 12/31/2021   CL 101 12/31/2021   CO2 27 12/31/2021   Lab Results  Component Value Date   ALT 20 12/31/2021   AST 23 12/31/2021   ALKPHOS 50 12/31/2021   BILITOT 0.5 12/31/2021   Lab Results  Component Value Date   HGBA1C 6.0 (H) 01/12/2022  HGBA1C 6.0 (H) 10/05/2021   HGBA1C 5.8 (H) 06/20/2021   HGBA1C 6.7 (H) 02/01/2021   HGBA1C 6.2 02/12/2020   Lab Results  Component Value Date   INSULIN 11.0 01/12/2022   INSULIN 22.7 02/01/2021   Lab Results  Component Value Date   TSH 1.090 09/06/2021   Lab Results  Component Value Date   CHOL 189 12/05/2021   HDL 61 12/05/2021   LDLCALC 107 (H) 12/05/2021   TRIG 119 12/05/2021   CHOLHDL 3.1 12/05/2021   Lab Results  Component Value Date   VD25OH 46.1 01/12/2022   VD25OH 37.2 10/05/2021   VD25OH 22.9 (L) 06/20/2021   Lab Results  Component Value Date   WBC 6.2 12/31/2021   HGB 12.8 12/31/2021   HCT 40.5 12/31/2021   MCV 86.9 12/31/2021   PLT 364 12/31/2021   Lab Results  Component Value Date   IRON 30 06/20/2021   TIBC 444 06/20/2021   FERRITIN 8 (L) 06/20/2021   Attestation Statements:   Reviewed by clinician on day of visit: allergies, medications, problem list, medical history, surgical history, family history, social history, and previous encounter notes.  I, Kyung Rudd, BS, CMA, am acting as  transcriptionist for [employer], DO.   I have reviewed the above documentation for accuracy and completeness, and I agree with the above. [name], D.O.  The 21st Century Cures Act was signed into law in 2016 which includes the topic of electronic health records.  This provides immediate access to information in MyChart.  This includes consultation notes, operative notes, office notes, lab results and pathology reports.  If you have any questions about what you read please let us know at your next visit so we can discuss your concerns and take corrective action if need be.  We are right here with you.

## 2022-03-20 DIAGNOSIS — M25519 Pain in unspecified shoulder: Secondary | ICD-10-CM | POA: Diagnosis not present

## 2022-03-21 DIAGNOSIS — M25519 Pain in unspecified shoulder: Secondary | ICD-10-CM | POA: Diagnosis not present

## 2022-03-21 DIAGNOSIS — H5213 Myopia, bilateral: Secondary | ICD-10-CM | POA: Diagnosis not present

## 2022-03-22 DIAGNOSIS — M25519 Pain in unspecified shoulder: Secondary | ICD-10-CM | POA: Diagnosis not present

## 2022-03-23 ENCOUNTER — Telehealth: Payer: Self-pay | Admitting: Family Medicine

## 2022-03-23 DIAGNOSIS — M25519 Pain in unspecified shoulder: Secondary | ICD-10-CM | POA: Diagnosis not present

## 2022-03-23 DIAGNOSIS — Z1152 Encounter for screening for COVID-19: Secondary | ICD-10-CM | POA: Diagnosis not present

## 2022-03-23 NOTE — Telephone Encounter (Signed)
Patient dropped off DMV placard form to be completed. Last DOS was 02/01/22. Placed in [employer].

## 2022-03-24 DIAGNOSIS — M1711 Unilateral primary osteoarthritis, right knee: Secondary | ICD-10-CM | POA: Diagnosis not present

## 2022-03-24 NOTE — Telephone Encounter (Signed)
Clinical info completed on [facility] form.  Placed form in PCP's box for completion.    When form is completed, please route note to "RN Team" and place in wall pocket in front office.   Salvatore Marvel, CMA

## 2022-03-27 DIAGNOSIS — M1711 Unilateral primary osteoarthritis, right knee: Secondary | ICD-10-CM | POA: Diagnosis not present

## 2022-03-28 DIAGNOSIS — M1711 Unilateral primary osteoarthritis, right knee: Secondary | ICD-10-CM | POA: Diagnosis not present

## 2022-03-28 NOTE — Telephone Encounter (Signed)
Form completed, placed in RN box 

## 2022-03-29 DIAGNOSIS — M1711 Unilateral primary osteoarthritis, right knee: Secondary | ICD-10-CM | POA: Diagnosis not present

## 2022-03-30 ENCOUNTER — Other Ambulatory Visit: Payer: Self-pay

## 2022-03-30 DIAGNOSIS — Z1152 Encounter for screening for COVID-19: Secondary | ICD-10-CM | POA: Diagnosis not present

## 2022-03-30 DIAGNOSIS — M1711 Unilateral primary osteoarthritis, right knee: Secondary | ICD-10-CM | POA: Diagnosis not present

## 2022-03-30 MED ORDER — VALSARTAN 80 MG PO TABS
80.0000 mg | ORAL_TABLET | Freq: Every day | ORAL | 0 refills | Status: DC
Start: 2022-03-30 — End: 2022-08-14

## 2022-03-30 NOTE — Telephone Encounter (Signed)
DMV placard placed up front for pick up.   Copy made for batch scanning.   Patient aware.  

## 2022-03-31 DIAGNOSIS — M1711 Unilateral primary osteoarthritis, right knee: Secondary | ICD-10-CM | POA: Diagnosis not present

## 2022-04-03 ENCOUNTER — Other Ambulatory Visit (INDEPENDENT_AMBULATORY_CARE_PROVIDER_SITE_OTHER): Payer: Self-pay | Admitting: Family Medicine

## 2022-04-03 DIAGNOSIS — D508 Other iron deficiency anemias: Secondary | ICD-10-CM

## 2022-04-03 DIAGNOSIS — E1169 Type 2 diabetes mellitus with other specified complication: Secondary | ICD-10-CM

## 2022-04-03 DIAGNOSIS — M1711 Unilateral primary osteoarthritis, right knee: Secondary | ICD-10-CM | POA: Diagnosis not present

## 2022-04-04 ENCOUNTER — Ambulatory Visit: Payer: Medicaid Other

## 2022-04-04 DIAGNOSIS — M1711 Unilateral primary osteoarthritis, right knee: Secondary | ICD-10-CM | POA: Diagnosis not present

## 2022-04-05 DIAGNOSIS — M1711 Unilateral primary osteoarthritis, right knee: Secondary | ICD-10-CM | POA: Diagnosis not present

## 2022-04-06 DIAGNOSIS — Z1152 Encounter for screening for COVID-19: Secondary | ICD-10-CM | POA: Diagnosis not present

## 2022-04-06 DIAGNOSIS — M1711 Unilateral primary osteoarthritis, right knee: Secondary | ICD-10-CM | POA: Diagnosis not present

## 2022-04-07 DIAGNOSIS — M1711 Unilateral primary osteoarthritis, right knee: Secondary | ICD-10-CM | POA: Diagnosis not present

## 2022-04-10 ENCOUNTER — Encounter (INDEPENDENT_AMBULATORY_CARE_PROVIDER_SITE_OTHER): Payer: Self-pay | Admitting: Family Medicine

## 2022-04-10 ENCOUNTER — Ambulatory Visit (INDEPENDENT_AMBULATORY_CARE_PROVIDER_SITE_OTHER): Payer: Medicaid Other | Admitting: Family Medicine

## 2022-04-10 VITALS — BP 124/85 | HR 96 | Temp 99.0°F | Ht 73.0 in | Wt 373.0 lb

## 2022-04-10 DIAGNOSIS — E1169 Type 2 diabetes mellitus with other specified complication: Secondary | ICD-10-CM | POA: Diagnosis not present

## 2022-04-10 DIAGNOSIS — Z7984 Long term (current) use of oral hypoglycemic drugs: Secondary | ICD-10-CM | POA: Diagnosis not present

## 2022-04-10 DIAGNOSIS — Z6841 Body Mass Index (BMI) 40.0 and over, adult: Secondary | ICD-10-CM

## 2022-04-10 DIAGNOSIS — K5909 Other constipation: Secondary | ICD-10-CM | POA: Diagnosis not present

## 2022-04-10 DIAGNOSIS — D508 Other iron deficiency anemias: Secondary | ICD-10-CM

## 2022-04-10 DIAGNOSIS — Z7985 Long-term (current) use of injectable non-insulin antidiabetic drugs: Secondary | ICD-10-CM

## 2022-04-10 DIAGNOSIS — E669 Obesity, unspecified: Secondary | ICD-10-CM | POA: Diagnosis not present

## 2022-04-10 DIAGNOSIS — M1711 Unilateral primary osteoarthritis, right knee: Secondary | ICD-10-CM | POA: Diagnosis not present

## 2022-04-10 MED ORDER — METFORMIN HCL 1000 MG PO TABS: 1000.0000 mg | ORAL_TABLET | Freq: Two times a day (BID) | ORAL | 0 refills | Status: DC

## 2022-04-10 MED ORDER — SEMAGLUTIDE (1 MG/DOSE) 4 MG/3ML ~~LOC~~ SOPN: 1.0000 mg | PEN_INJECTOR | SUBCUTANEOUS | 0 refills | Status: DC

## 2022-04-10 MED ORDER — PNV PRENATAL PLUS MULTIVIT+DHA 27-1 & 312 MG PO MISC: ORAL | 0 refills | Status: DC

## 2022-04-10 MED ORDER — LINACLOTIDE 72 MCG PO CAPS: 72.0000 ug | ORAL_CAPSULE | Freq: Every day | ORAL | 0 refills | Status: DC

## 2022-04-11 DIAGNOSIS — M1711 Unilateral primary osteoarthritis, right knee: Secondary | ICD-10-CM | POA: Diagnosis not present

## 2022-04-12 DIAGNOSIS — M1711 Unilateral primary osteoarthritis, right knee: Secondary | ICD-10-CM | POA: Diagnosis not present

## 2022-04-13 ENCOUNTER — Other Ambulatory Visit: Payer: Self-pay

## 2022-04-13 DIAGNOSIS — J302 Other seasonal allergic rhinitis: Secondary | ICD-10-CM

## 2022-04-13 DIAGNOSIS — Z1152 Encounter for screening for COVID-19: Secondary | ICD-10-CM | POA: Diagnosis not present

## 2022-04-13 DIAGNOSIS — M1711 Unilateral primary osteoarthritis, right knee: Secondary | ICD-10-CM | POA: Diagnosis not present

## 2022-04-13 MED ORDER — METOPROLOL TARTRATE 25 MG PO TABS: 12.5000 mg | ORAL_TABLET | Freq: Two times a day (BID) | ORAL | 0 refills | Status: DC | PRN

## 2022-04-13 MED ORDER — ALBUTEROL SULFATE HFA 108 (90 BASE) MCG/ACT IN AERS: 1.0000 | INHALATION_SPRAY | RESPIRATORY_TRACT | 0 refills | Status: DC | PRN

## 2022-04-13 MED ORDER — FLUTICASONE PROPIONATE 50 MCG/ACT NA SUSP: 2.0000 | Freq: Every day | NASAL | 0 refills | Status: DC | PRN

## 2022-04-14 DIAGNOSIS — M1711 Unilateral primary osteoarthritis, right knee: Secondary | ICD-10-CM | POA: Diagnosis not present

## 2022-04-17 DIAGNOSIS — M1711 Unilateral primary osteoarthritis, right knee: Secondary | ICD-10-CM | POA: Diagnosis not present

## 2022-04-18 ENCOUNTER — Other Ambulatory Visit: Payer: Self-pay | Admitting: Family Medicine

## 2022-04-18 DIAGNOSIS — J302 Other seasonal allergic rhinitis: Secondary | ICD-10-CM

## 2022-04-18 DIAGNOSIS — M1711 Unilateral primary osteoarthritis, right knee: Secondary | ICD-10-CM | POA: Diagnosis not present

## 2022-04-19 DIAGNOSIS — M1711 Unilateral primary osteoarthritis, right knee: Secondary | ICD-10-CM | POA: Diagnosis not present

## 2022-04-20 DIAGNOSIS — M1711 Unilateral primary osteoarthritis, right knee: Secondary | ICD-10-CM | POA: Diagnosis not present

## 2022-04-20 NOTE — Progress Notes (Signed)
Chief Complaint:   OBESITY Karen Brennan is here to discuss her progress with her obesity treatment plan along with follow-up of her obesity related diagnoses. Karen Brennan is on the Category 3 Plan and states she is following her eating plan approximately 80% of the time. Karen Brennan states she is walking/biking/weights 30 minutes 3 times per week.  Today's visit was #: 19 Starting weight: 443 lbs Starting date: 02/01/2022 Today's weight: 373 lbs Today's date: 04/10/2022 Total lbs lost to date: 70 lbs Total lbs lost since last in-office visit: 3  Interim History: Karen Brennan is skipping a meal a day on average. She has kids/grand babies that she is taking care of and not herself. She is not eating her proteins and is skipping foods--meats.  Subjective:   1. Type 2 diabetes mellitus with other specified complication, unspecified whether long term insulin use (HCC) Imanie is not checking her blood sugars but feeling well. She denies lows/highs. Not eating---skipping meals. She lost muscle mass and gained fat.  2. Other iron deficiency anemia Karen Brennan is asymptomatic with no concerns. Not make constipation worse.  3. Other constipation Karen Brennan drinks 5 bottles of H2O per day. If she does not have Linzess she is not able to poop. She ran out of it several days ago and got backed up. Used Miralax with not much help.  Assessment/Plan:  No orders of the defined types were placed in this encounter.   Medications Discontinued During This Encounter  Medication Reason   linaclotide (LINZESS) 72 MCG capsule Reorder   metFORMIN (GLUCOPHAGE) 1000 MG tablet Reorder   Prenatal Vit-Fe Fum-FA-Omega (PNV PRENATAL PLUS MULTIVIT+DHA) 27-1 & 312 MG MISC Reorder   Semaglutide, 1 MG/DOSE, 4 MG/3ML SOPN Reorder     Meds ordered this encounter  Medications   linaclotide (LINZESS) 72 MCG capsule    Sig: Take 1 capsule (72 mcg total) by mouth daily before breakfast.    Dispense:  90 capsule    Refill:  0   metFORMIN  (GLUCOPHAGE) 1000 MG tablet    Sig: Take 1 tablet (1,000 mg total) by mouth 2 (two) times daily with a meal.    Dispense:  60 tablet    Refill:  0   Prenatal Vit-Fe Fum-FA-Omega (PNV PRENATAL PLUS MULTIVIT+DHA) 27-1 & 312 MG MISC    Sig: 1 po qd    Dispense:  90 each    Refill:  0   Semaglutide, 1 MG/DOSE, 4 MG/3ML SOPN    Sig: Inject 1 mg as directed once a week.    Dispense:  3 mL    Refill:  0     1. Type 2 diabetes mellitus with other specified complication, unspecified whether long term insulin use (HCC) We will refill Ozempic 1 mg SubQ weekly AND refill Metformin 1000 mg twice a day for 1 month with 0 refills.  -Refill metFORMIN (GLUCOPHAGE) 1000 MG tablet; Take 1 tablet (1,000 mg total) by mouth 2 (two) times daily with a meal.  Dispense: 60 tablet; Refill: 0  -Refill Semaglutide, 1 MG/DOSE, 4 MG/3ML SOPN; Inject 1 mg as directed once a week.  Dispense: 3 mL; Refill: 0  2. Other iron deficiency anemia We will refill PNV with MV and DHA 312 mg daily for 3 months with 0 refills.  -Refill Prenatal Vit-Fe Fum-FA-Omega (PNV PRENATAL PLUS MULTIVIT+DHA) 27-1 & 312 MG MISC; 1 po qd  Dispense: 90 each; Refill: 0  3. Other constipation We will refill Linzess 72 mcg daily for 3 months with 0  refills.  -Refill linaclotide (LINZESS) 72 MCG capsule; Take 1 capsule (72 mcg total) by mouth daily before breakfast.  Dispense: 90 capsule; Refill: 0  4. Obesity, current BMI 49.3 Karen Brennan is currently in the action stage of change. As such, her goal is to continue with weight loss efforts. She has agreed to the Category 3 Plan.   Karen Brennan using Theme park manager nearby for better deals on lean proteins.  Exercise goals: As is. Increase as tolerated.  Behavioral modification strategies: no skipping meals and planning for success.  Karen Brennan has agreed to follow-up with our clinic in 3 weeks. She was informed of the importance of frequent follow-up visits to maximize her success with intensive lifestyle  modifications for her multiple health conditions.   Objective:   Blood pressure 124/85, pulse 96, temperature 99 F (37.2 C), height 6\' 1"  (1.854 m), weight (!) 373 lb (169.2 kg), SpO2 90 %. Body mass index is 49.21 kg/m.  General: Cooperative, alert, well developed, in no acute distress. HEENT: Conjunctivae and lids unremarkable. Cardiovascular: Regular rhythm.  Lungs: Normal work of breathing. Neurologic: No focal deficits.   Lab Results  Component Value Date   CREATININE 0.85 12/31/2021   BUN 7 12/31/2021   NA 137 12/31/2021   K 3.9 12/31/2021   CL 101 12/31/2021   CO2 27 12/31/2021   Lab Results  Component Value Date   ALT 20 12/31/2021   AST 23 12/31/2021   ALKPHOS 50 12/31/2021   BILITOT 0.5 12/31/2021   Lab Results  Component Value Date   HGBA1C 6.0 (H) 01/12/2022   HGBA1C 6.0 (H) 10/05/2021   HGBA1C 5.8 (H) 06/20/2021   HGBA1C 6.7 (H) 02/01/2021   HGBA1C 6.2 02/12/2020   Lab Results  Component Value Date   INSULIN 11.0 01/12/2022   INSULIN 22.7 02/01/2021   Lab Results  Component Value Date   TSH 1.090 09/06/2021   Lab Results  Component Value Date   CHOL 189 12/05/2021   HDL 61 12/05/2021   LDLCALC 107 (H) 12/05/2021   TRIG 119 12/05/2021   CHOLHDL 3.1 12/05/2021   Lab Results  Component Value Date   VD25OH 46.1 01/12/2022   VD25OH 37.2 10/05/2021   VD25OH 22.9 (L) 06/20/2021   Lab Results  Component Value Date   WBC 6.2 12/31/2021   HGB 12.8 12/31/2021   HCT 40.5 12/31/2021   MCV 86.9 12/31/2021   PLT 364 12/31/2021   Lab Results  Component Value Date   IRON 30 06/20/2021   TIBC 444 06/20/2021   FERRITIN 8 (L) 06/20/2021   Attestation Statements:   Reviewed by clinician on day of visit: allergies, medications, problem list, medical history, surgical history, family history, social history, and previous encounter notes.  I, Brendell Tyus, am acting as 08/18/2021 for Energy manager, DO.   I have reviewed the above  documentation for accuracy and completeness, and I agree with the above. [employer], D.O.  The 21st Century Cures Act was signed into law in 2016 which includes the topic of electronic health records.  This provides immediate access to information in MyChart.  This includes consultation notes, operative notes, office notes, lab results and pathology reports.  If you have any questions about what you read please let 2017 know at your next visit so we can discuss your concerns and take corrective action if need be.  We are right here with you.

## 2022-04-21 DIAGNOSIS — M25519 Pain in unspecified shoulder: Secondary | ICD-10-CM | POA: Diagnosis not present

## 2022-04-24 ENCOUNTER — Other Ambulatory Visit: Payer: Self-pay

## 2022-04-24 DIAGNOSIS — M25519 Pain in unspecified shoulder: Secondary | ICD-10-CM | POA: Diagnosis not present

## 2022-04-24 DIAGNOSIS — G5793 Unspecified mononeuropathy of bilateral lower limbs: Secondary | ICD-10-CM

## 2022-04-24 DIAGNOSIS — E559 Vitamin D deficiency, unspecified: Secondary | ICD-10-CM

## 2022-04-24 MED ORDER — VITAMIN D (ERGOCALCIFEROL) 1.25 MG (50000 UNIT) PO CAPS: ORAL_CAPSULE | ORAL | 0 refills | Status: DC

## 2022-04-24 MED ORDER — GABAPENTIN 300 MG PO CAPS: 300.0000 mg | ORAL_CAPSULE | Freq: Three times a day (TID) | ORAL | 1 refills | Status: DC

## 2022-04-25 DIAGNOSIS — M25519 Pain in unspecified shoulder: Secondary | ICD-10-CM | POA: Diagnosis not present

## 2022-04-26 DIAGNOSIS — Z1152 Encounter for screening for COVID-19: Secondary | ICD-10-CM | POA: Diagnosis not present

## 2022-04-26 DIAGNOSIS — M25519 Pain in unspecified shoulder: Secondary | ICD-10-CM | POA: Diagnosis not present

## 2022-04-27 DIAGNOSIS — M25519 Pain in unspecified shoulder: Secondary | ICD-10-CM | POA: Diagnosis not present

## 2022-04-28 DIAGNOSIS — Z1152 Encounter for screening for COVID-19: Secondary | ICD-10-CM | POA: Diagnosis not present

## 2022-04-28 DIAGNOSIS — M25519 Pain in unspecified shoulder: Secondary | ICD-10-CM | POA: Diagnosis not present

## 2022-05-01 ENCOUNTER — Ambulatory Visit (INDEPENDENT_AMBULATORY_CARE_PROVIDER_SITE_OTHER): Payer: Medicaid Other | Admitting: Family Medicine

## 2022-05-01 ENCOUNTER — Other Ambulatory Visit: Payer: Self-pay | Admitting: Family Medicine

## 2022-05-01 VITALS — BP 137/81 | HR 94 | Temp 98.4°F | Ht 73.0 in | Wt 367.6 lb

## 2022-05-01 DIAGNOSIS — K5909 Other constipation: Secondary | ICD-10-CM

## 2022-05-01 DIAGNOSIS — J302 Other seasonal allergic rhinitis: Secondary | ICD-10-CM | POA: Diagnosis not present

## 2022-05-01 DIAGNOSIS — E1169 Type 2 diabetes mellitus with other specified complication: Secondary | ICD-10-CM | POA: Diagnosis not present

## 2022-05-01 DIAGNOSIS — Z7984 Long term (current) use of oral hypoglycemic drugs: Secondary | ICD-10-CM

## 2022-05-01 DIAGNOSIS — Z6841 Body Mass Index (BMI) 40.0 and over, adult: Secondary | ICD-10-CM

## 2022-05-01 DIAGNOSIS — M25519 Pain in unspecified shoulder: Secondary | ICD-10-CM | POA: Diagnosis not present

## 2022-05-01 DIAGNOSIS — I10 Essential (primary) hypertension: Secondary | ICD-10-CM

## 2022-05-01 DIAGNOSIS — E559 Vitamin D deficiency, unspecified: Secondary | ICD-10-CM

## 2022-05-01 DIAGNOSIS — E669 Obesity, unspecified: Secondary | ICD-10-CM | POA: Diagnosis not present

## 2022-05-01 DIAGNOSIS — D508 Other iron deficiency anemias: Secondary | ICD-10-CM

## 2022-05-01 MED ORDER — LORATADINE 10 MG PO TABS: 10.0000 mg | ORAL_TABLET | Freq: Every day | ORAL | 0 refills | Status: DC

## 2022-05-01 MED ORDER — METFORMIN HCL 1000 MG PO TABS: 1000.0000 mg | ORAL_TABLET | Freq: Two times a day (BID) | ORAL | 0 refills | Status: DC

## 2022-05-01 MED ORDER — SEMAGLUTIDE (1 MG/DOSE) 4 MG/3ML ~~LOC~~ SOPN: 1.0000 mg | PEN_INJECTOR | SUBCUTANEOUS | 0 refills | Status: DC

## 2022-05-01 MED ORDER — LINACLOTIDE 72 MCG PO CAPS: 72.0000 ug | ORAL_CAPSULE | Freq: Every day | ORAL | 0 refills | Status: DC

## 2022-05-01 MED ORDER — PNV PRENATAL PLUS MULTIVIT+DHA 27-1 & 312 MG PO MISC: ORAL | 0 refills | Status: DC

## 2022-05-01 MED ORDER — VITAMIN D (ERGOCALCIFEROL) 1.25 MG (50000 UNIT) PO CAPS: ORAL_CAPSULE | ORAL | 0 refills | Status: DC

## 2022-05-02 DIAGNOSIS — M25519 Pain in unspecified shoulder: Secondary | ICD-10-CM | POA: Diagnosis not present

## 2022-05-03 DIAGNOSIS — M25519 Pain in unspecified shoulder: Secondary | ICD-10-CM | POA: Diagnosis not present

## 2022-05-04 DIAGNOSIS — M25519 Pain in unspecified shoulder: Secondary | ICD-10-CM | POA: Diagnosis not present

## 2022-05-05 DIAGNOSIS — M25519 Pain in unspecified shoulder: Secondary | ICD-10-CM | POA: Diagnosis not present

## 2022-05-08 DIAGNOSIS — M25519 Pain in unspecified shoulder: Secondary | ICD-10-CM | POA: Diagnosis not present

## 2022-05-09 ENCOUNTER — Ambulatory Visit (INDEPENDENT_AMBULATORY_CARE_PROVIDER_SITE_OTHER): Payer: Medicaid Other

## 2022-05-09 DIAGNOSIS — M25519 Pain in unspecified shoulder: Secondary | ICD-10-CM | POA: Diagnosis not present

## 2022-05-09 DIAGNOSIS — Z23 Encounter for immunization: Secondary | ICD-10-CM

## 2022-05-09 DIAGNOSIS — H52223 Regular astigmatism, bilateral: Secondary | ICD-10-CM | POA: Diagnosis not present

## 2022-05-09 DIAGNOSIS — H5213 Myopia, bilateral: Secondary | ICD-10-CM | POA: Diagnosis not present

## 2022-05-10 DIAGNOSIS — M25519 Pain in unspecified shoulder: Secondary | ICD-10-CM | POA: Diagnosis not present

## 2022-05-11 DIAGNOSIS — M25519 Pain in unspecified shoulder: Secondary | ICD-10-CM | POA: Diagnosis not present

## 2022-05-12 DIAGNOSIS — M25519 Pain in unspecified shoulder: Secondary | ICD-10-CM | POA: Diagnosis not present

## 2022-05-12 DIAGNOSIS — Z1152 Encounter for screening for COVID-19: Secondary | ICD-10-CM | POA: Diagnosis not present

## 2022-05-15 DIAGNOSIS — M25519 Pain in unspecified shoulder: Secondary | ICD-10-CM | POA: Diagnosis not present

## 2022-05-15 NOTE — Progress Notes (Signed)
Chief Complaint:   OBESITY Karen Brennan is here to discuss her progress with her obesity treatment plan along with follow-up of her obesity related diagnoses. Karen Brennan is on the Category 3 Plan and states she is following her eating plan approximately 80% of the time. Karen Brennan states she is walking, foot bike and lifting weights 30 minutes 3 times per week.  Today's visit was #: 20 Starting weight: 443 lbs Starting date: 02/01/2022 Today's weight: 367 lbs Today's date: 05/01/2022 Total lbs lost to date: 76 lbs Total lbs lost since last in-office visit: 6 lbs  Interim History: Thanksgiving day handouts given on recipes and strategies.   Subjective:   1. Type 2 diabetes mellitus with obesity (HCC) Medications reviewed. Diabetic ROS: no polyuria or polydipsia, no chest pain, dyspnea or TIA's, no numbness, tingling or pain in extremities.  2. Other constipation Symptoms well controlled, no issues.   3. Seasonal allergies Karen Brennan has seasonal allergies that have been more bothersome with the change of weather/season.   Pt takes the prescribed medications regularlywhich has improved symptoms some and her quality of life.  Tolerating well, denies concerns.   4. Vitamin D deficiency She is currently taking prescription vitamin D 50,000 IU each week. She denies nausea, vomiting or muscle weakness.  5. Other iron deficiency anemia Karen Brennan is tolerating medication(s) well without side effects.  Medication compliance is good as patient endorses taking it as prescribed.  Symptoms are stable and the patient denies additional concerns regarding this condition.    Assessment/Plan:  No orders of the defined types were placed in this encounter.   Medications Discontinued During This Encounter  Medication Reason   loratadine (CLARITIN) 10 MG tablet Reorder   linaclotide (LINZESS) 72 MCG capsule Reorder   metFORMIN (GLUCOPHAGE) 1000 MG tablet Reorder   Prenatal Vit-Fe Fum-FA-Omega (PNV  PRENATAL PLUS MULTIVIT+DHA) 27-1 & 312 MG MISC Reorder   Semaglutide, 1 MG/DOSE, 4 MG/3ML SOPN Reorder   Vitamin D, Ergocalciferol, (DRISDOL) 1.25 MG (50000 UNIT) CAPS capsule Reorder     Meds ordered this encounter  Medications   linaclotide (LINZESS) 72 MCG capsule    Sig: Take 1 capsule (72 mcg total) by mouth daily before breakfast.    Dispense:  90 capsule    Refill:  0   Vitamin D, Ergocalciferol, (DRISDOL) 1.25 MG (50000 UNIT) CAPS capsule    Sig: 1 tab q wed and 1 tab q sun    Dispense:  24 capsule    Refill:  0    90 d supply;  ** OV for RF **   Do not send RF request   metFORMIN (GLUCOPHAGE) 1000 MG tablet    Sig: Take 1 tablet (1,000 mg total) by mouth 2 (two) times daily with a meal.    Dispense:  60 tablet    Refill:  0   Prenatal Vit-Fe Fum-FA-Omega (PNV PRENATAL PLUS MULTIVIT+DHA) 27-1 & 312 MG MISC    Sig: 1 po qd    Dispense:  90 each    Refill:  0   Semaglutide, 1 MG/DOSE, 4 MG/3ML SOPN    Sig: Inject 1 mg as directed once a week.    Dispense:  3 mL    Refill:  0   DISCONTD: loratadine (CLARITIN) 10 MG tablet    Sig: Take 1 tablet (10 mg total) by mouth daily.    Dispense:  90 tablet    Refill:  0     1. Type 2 diabetes  mellitus with obesity (HCC) Refill - metFORMIN (GLUCOPHAGE) 1000 MG tablet; Take 1 tablet (1,000 mg total) by mouth 2 (two) times daily with a meal.  Dispense: 60 tablet; Refill: 0  Refill - Semaglutide, 1 MG/DOSE, 4 MG/3ML SOPN; Inject 1 mg as directed once a week.  Dispense: 3 mL; Refill: 0  2. Other constipation Karen Brennan was informed that a decrease in bowel movement frequency is normal while losing weight, but stools should not be hard or painful.  - Patient advised to begin MiraLAX once daily or every other day titrated for one softer, normal caliber bowel movement each day.    - Discussed addition of laxatives such as Peri-colace and Colace as needed for refractory constipation.   - Adequate daily water intake encouraged along with  activity/ movement  - Bowel habits and good bowel hygiene discussed with patient  - F/up with your primary care provider if W or NI to rule out causes for constipation besides nutrition  Additional Counseling-  Good Bowel Health: Your goal is to have one soft bowel movement each day. Drink at least half of your weight in ounces of water per day unless otherwise noted by one of your doctors that you must restrict water intake.  Eat plenty of fiber (goal is over 25 grams each day).  It is best to get most of your fiber from dietary sources which includes leafy green vegetables, fresh fruit, and whole grains.  You may need to add fiber with the help of OTC fiber supplements.  These include the likes of Metamucil, Citrucel, Psyllium husks and Flaxseed.  If you are still having trouble, try adding Fleet's enemas or Magnesium Citrate. If all of these changes do not work, contact your PCP.   Refill - linaclotide (LINZESS) 72 MCG capsule; Take 1 capsule (72 mcg total) by mouth daily before breakfast.  Dispense: 90 capsule; Refill: 0  3. Seasonal allergies Refill prn- see below.    - Seasonal / environmental allergies and pathophysiology of disease process discussed with patient.  - Preventative strategies as first line for management discussed.  I encouraged use of KN or N-95 mask prn as well as sterile saline rinses such as Lloyd Huger Med or AYR sinus rinses to be done once- twice daily and after any prolonged exposure to the environment or allergen.    It is best to use distilled water or previously boiled water    - If eyes are itchy or irritated feeling when your seasonal allergies get bad, ok to use Naphcon-A over-the-counter eyedrops as needed - Encouraged to shower in evenings if they have any prolonged exposure to allergens during the day   Refill -Claritin 10 mg one tablet daily, #30, no refills.  4. Vitamin D deficiency Low Vitamin D level contributes to fatigue and are associated with obesity,  breast, and colon cancer. She agrees to continue to take prescription Vitamin D @50 ,000 IU every week and will follow-up for routine testing of Vitamin D, at least 2-3 times per year to avoid over-replacement.  Refill - Vitamin D, Ergocalciferol, (DRISDOL) 1.25 MG (50000 UNIT) CAPS capsule; 1 tab q wed and 1 tab q sun  Dispense: 24 capsule; Refill: 0  5. Other iron deficiency anemia Orders and follow up as documented in patient record.  Counseling Iron is essential for our bodies to make red blood cells.  Reasons that someone may be deficient include: an iron-deficient diet (more likely in those following vegan or vegetarian diets), women with heavy menses, patients  with GI disorders or poor absorption, patients that have had bariatric surgery, frequent blood donors, patients with cancer, and patients with heart disease.   An iron supplement has been recommended. This is found over-the-counter. We recommend:  Iron-rich foods include dark leafy greens, red and white meats, eggs, seafood, and beans.   Certain foods and drinks prevent your body from absorbing iron properly. Avoid eating these foods in the same meal as iron-rich foods or with iron supplements. These foods include: coffee, black tea, and red wine; milk, dairy products, and foods that are high in calcium; beans and soybeans; whole grains.  Constipation can be a side effect of iron supplementation. Increased water and fiber intake are helpful. Water goal: > 2 liters/day. Fiber goal: > 25 grams/day. Refill - Prenatal Vit-Fe Fum-FA-Omega (PNV PRENATAL PLUS MULTIVIT+DHA) 27-1 & 312 MG MISC; 1 po qd  Dispense: 90 each; Refill: 0  6. Obesity, current BMI 48.5 Alizey is currently in the action stage of change. As such, her goal is to continue with weight loss efforts. She has agreed to the Category 3 Plan.   Exercise goals:  As is.   Behavioral modification strategies: holiday eating strategies .  Jamileth has agreed to follow-up with our  clinic in 3 weeks. She was informed of the importance of frequent follow-up visits to maximize her success with intensive lifestyle modifications for her multiple health conditions.   Objective:   Blood pressure 137/81, pulse 94, temperature 98.4 F (36.9 C), height 6\' 1"  (1.854 m), weight (!) 367 lb 9.6 oz (166.7 kg), SpO2 99 %. Body mass index is 48.5 kg/m.  General: Cooperative, alert, well developed, in no acute distress. HEENT: Conjunctivae and lids unremarkable. Cardiovascular: Regular rhythm.  Lungs: Normal work of breathing. Neurologic: No focal deficits.   Lab Results  Component Value Date   CREATININE 0.85 12/31/2021   BUN 7 12/31/2021   NA 137 12/31/2021   K 3.9 12/31/2021   CL 101 12/31/2021   CO2 27 12/31/2021   Lab Results  Component Value Date   ALT 20 12/31/2021   AST 23 12/31/2021   ALKPHOS 50 12/31/2021   BILITOT 0.5 12/31/2021   Lab Results  Component Value Date   HGBA1C 6.0 (H) 01/12/2022   HGBA1C 6.0 (H) 10/05/2021   HGBA1C 5.8 (H) 06/20/2021   HGBA1C 6.7 (H) 02/01/2021   HGBA1C 6.2 02/12/2020   Lab Results  Component Value Date   INSULIN 11.0 01/12/2022   INSULIN 22.7 02/01/2021   Lab Results  Component Value Date   TSH 1.090 09/06/2021   Lab Results  Component Value Date   CHOL 189 12/05/2021   HDL 61 12/05/2021   LDLCALC 107 (H) 12/05/2021   TRIG 119 12/05/2021   CHOLHDL 3.1 12/05/2021   Lab Results  Component Value Date   VD25OH 46.1 01/12/2022   VD25OH 37.2 10/05/2021   VD25OH 22.9 (L) 06/20/2021   Lab Results  Component Value Date   WBC 6.2 12/31/2021   HGB 12.8 12/31/2021   HCT 40.5 12/31/2021   MCV 86.9 12/31/2021   PLT 364 12/31/2021   Lab Results  Component Value Date   IRON 30 06/20/2021   TIBC 444 06/20/2021   FERRITIN 8 (L) 06/20/2021    Attestation Statements:   Reviewed by clinician on day of visit: allergies, medications, problem list, medical history, surgical history, family history, social history,  and previous encounter notes.  I, 08/18/2021, RMA, am acting as [place] for Energy manager, DO.  I have reviewed the above documentation for accuracy and completeness, and I agree with the above. Marjory Sneddon, D.O.  The [place] was signed into law in 2016 which includes the topic of electronic health records.  This provides immediate access to information in MyChart.  This includes consultation notes, operative notes, office notes, lab results and pathology reports.  If you have any questions about what you read please let us know at your next visit so we can discuss your concerns and take corrective action if need be.  We are right here with you.

## 2022-05-15 NOTE — Patient Instructions (Signed)
Karen Brennan is tolerating medication(s) well without side effects.  Medication compliance is good as patient endorses taking it as prescribed.  The patient denies additional concerns regarding this condition.

## 2022-05-16 DIAGNOSIS — M25519 Pain in unspecified shoulder: Secondary | ICD-10-CM | POA: Diagnosis not present

## 2022-05-17 DIAGNOSIS — M25519 Pain in unspecified shoulder: Secondary | ICD-10-CM | POA: Diagnosis not present

## 2022-05-18 DIAGNOSIS — M25519 Pain in unspecified shoulder: Secondary | ICD-10-CM | POA: Diagnosis not present

## 2022-05-19 DIAGNOSIS — M25519 Pain in unspecified shoulder: Secondary | ICD-10-CM | POA: Diagnosis not present

## 2022-05-22 ENCOUNTER — Ambulatory Visit (INDEPENDENT_AMBULATORY_CARE_PROVIDER_SITE_OTHER): Payer: Medicaid Other | Admitting: Family Medicine

## 2022-05-22 DIAGNOSIS — M25519 Pain in unspecified shoulder: Secondary | ICD-10-CM | POA: Diagnosis not present

## 2022-05-22 DIAGNOSIS — Z1152 Encounter for screening for COVID-19: Secondary | ICD-10-CM | POA: Diagnosis not present

## 2022-05-23 DIAGNOSIS — M25519 Pain in unspecified shoulder: Secondary | ICD-10-CM | POA: Diagnosis not present

## 2022-05-24 DIAGNOSIS — M25519 Pain in unspecified shoulder: Secondary | ICD-10-CM | POA: Diagnosis not present

## 2022-05-25 DIAGNOSIS — M25519 Pain in unspecified shoulder: Secondary | ICD-10-CM | POA: Diagnosis not present

## 2022-05-26 DIAGNOSIS — Z1152 Encounter for screening for COVID-19: Secondary | ICD-10-CM | POA: Diagnosis not present

## 2022-05-26 DIAGNOSIS — M25519 Pain in unspecified shoulder: Secondary | ICD-10-CM | POA: Diagnosis not present

## 2022-05-29 DIAGNOSIS — M25519 Pain in unspecified shoulder: Secondary | ICD-10-CM | POA: Diagnosis not present

## 2022-05-30 DIAGNOSIS — M25519 Pain in unspecified shoulder: Secondary | ICD-10-CM | POA: Diagnosis not present

## 2022-05-31 DIAGNOSIS — M25519 Pain in unspecified shoulder: Secondary | ICD-10-CM | POA: Diagnosis not present

## 2022-06-01 ENCOUNTER — Ambulatory Visit (INDEPENDENT_AMBULATORY_CARE_PROVIDER_SITE_OTHER): Payer: Medicaid Other | Admitting: Family Medicine

## 2022-06-01 VITALS — BP 128/87 | HR 95 | Temp 98.2°F | Ht 73.0 in | Wt 364.0 lb

## 2022-06-01 DIAGNOSIS — E669 Obesity, unspecified: Secondary | ICD-10-CM

## 2022-06-01 DIAGNOSIS — Z7984 Long term (current) use of oral hypoglycemic drugs: Secondary | ICD-10-CM

## 2022-06-01 DIAGNOSIS — Z9884 Bariatric surgery status: Secondary | ICD-10-CM

## 2022-06-01 DIAGNOSIS — Z6841 Body Mass Index (BMI) 40.0 and over, adult: Secondary | ICD-10-CM

## 2022-06-01 DIAGNOSIS — K5909 Other constipation: Secondary | ICD-10-CM | POA: Diagnosis not present

## 2022-06-01 DIAGNOSIS — E1169 Type 2 diabetes mellitus with other specified complication: Secondary | ICD-10-CM | POA: Diagnosis not present

## 2022-06-01 DIAGNOSIS — M25519 Pain in unspecified shoulder: Secondary | ICD-10-CM | POA: Diagnosis not present

## 2022-06-01 DIAGNOSIS — K5904 Chronic idiopathic constipation: Secondary | ICD-10-CM

## 2022-06-01 DIAGNOSIS — Z7985 Long-term (current) use of injectable non-insulin antidiabetic drugs: Secondary | ICD-10-CM

## 2022-06-01 DIAGNOSIS — E559 Vitamin D deficiency, unspecified: Secondary | ICD-10-CM

## 2022-06-01 MED ORDER — LINACLOTIDE 72 MCG PO CAPS: 72.0000 ug | ORAL_CAPSULE | Freq: Every day | ORAL | 0 refills | Status: DC

## 2022-06-01 MED ORDER — VITAMIN D (ERGOCALCIFEROL) 1.25 MG (50000 UNIT) PO CAPS: ORAL_CAPSULE | ORAL | 0 refills | Status: DC

## 2022-06-01 MED ORDER — PNV PRENATAL PLUS MULTIVIT+DHA 27-1 & 312 MG PO MISC: ORAL | 0 refills | Status: DC

## 2022-06-01 MED ORDER — METFORMIN HCL 1000 MG PO TABS: 1000.0000 mg | ORAL_TABLET | Freq: Two times a day (BID) | ORAL | 0 refills | Status: DC

## 2022-06-01 MED ORDER — SEMAGLUTIDE (1 MG/DOSE) 4 MG/3ML ~~LOC~~ SOPN: 1.0000 mg | PEN_INJECTOR | SUBCUTANEOUS | 0 refills | Status: DC

## 2022-06-02 ENCOUNTER — Other Ambulatory Visit: Payer: Self-pay | Admitting: Family Medicine

## 2022-06-02 DIAGNOSIS — M25519 Pain in unspecified shoulder: Secondary | ICD-10-CM | POA: Diagnosis not present

## 2022-06-05 DIAGNOSIS — M25519 Pain in unspecified shoulder: Secondary | ICD-10-CM | POA: Diagnosis not present

## 2022-06-06 DIAGNOSIS — M25519 Pain in unspecified shoulder: Secondary | ICD-10-CM | POA: Diagnosis not present

## 2022-06-07 DIAGNOSIS — M25519 Pain in unspecified shoulder: Secondary | ICD-10-CM | POA: Diagnosis not present

## 2022-06-07 LAB — HEMOGLOBIN A1C
Est. average glucose Bld gHb Est-mCnc: 117 mg/dL
Hgb A1c MFr Bld: 5.7 % — ABNORMAL HIGH (ref 4.8–5.6)

## 2022-06-07 LAB — VITAMIN D, 25-HYDROXY, TOTAL: Vitamin D, 25-Hydroxy, Serum: 43 ng/mL

## 2022-06-08 DIAGNOSIS — M25519 Pain in unspecified shoulder: Secondary | ICD-10-CM | POA: Diagnosis not present

## 2022-06-08 NOTE — Progress Notes (Signed)
Chief Complaint:   OBESITY Karen Brennan is here to discuss her progress with her obesity treatment plan along with follow-up of her obesity related diagnoses. Karen Brennan is on the Category 3 Plan and states she is following her eating plan approximately 80% of the time. Karen Brennan states she is walking and weight 30 minutes 3 times per week.  Today's visit was #: 21 Starting weight: 443 LBS Starting date: 02/01/2022 Today's weight: 367 LBS Today's date: 06/01/2022 Total lbs lost to date: 76 LBS Total lbs lost since last in-office visit: 3 LBS  Interim History: Patient has done some celebration eating.  Sometimes skipping breakfast.  Gets fuller quicker with Ozempic.  Currently at nadir weight post gastric bypass surgery with Dr. Lily Peer.  Subjective:   1. Type 2 diabetes mellitus with other specified complication, without long-term current use of insulin (HCC) She is not checking blood sugars.  She is on Ozempic 1 mg weekly, and metformin 1000 mg twice daily.  Patient denies nausea, vomiting, or abdominal pain.  2. Chronic constipation Taking Linzess 72 mcg daily.  Occasional diarrhea.  Has increased water intake.  3. Vitamin D deficiency Vitamin D level 46.1 on 01/12/2022.  Taking prescription vitamin D 50,000 IU 2 times weekly.  Energy level improving.  4. S/P gastric bypass Patient feels improved restriction with weight loss, smaller portions and increase protein intake.  Patient has increased exercise time. Has added a MVI daily.  Assessment/Plan:   1. Type 2 diabetes mellitus with other specified complication, without long-term current use of insulin (HCC) Check A1c today.  Refill- Semaglutide, 1 MG/DOSE, 4 MG/3ML SOPN; Inject 1 mg as directed once a week.  Dispense: 3 mL; Refill: 0  Refill- metFORMIN (GLUCOPHAGE) 1000 MG tablet; Take 1 tablet (1,000 mg total) by mouth 2 (two) times daily with a meal.  Dispense: 60 tablet; Refill: 0  - Hemoglobin A1c  2. Chronic  constipation Reduce Linzess to every other day if diarrhea recurs.  Refill- linaclotide (LINZESS) 72 MCG capsule; Take 1 capsule (72 mcg total) by mouth daily before breakfast.  Dispense: 90 capsule; Refill: 0  3. Vitamin D deficiency Check vitamin D level today.  Refill- Vitamin D, Ergocalciferol, (DRISDOL) 1.25 MG (50000 UNIT) CAPS capsule; 1 tab q wed and 1 tab q sun  Dispense: 10 capsule; Refill: 0  - Vitamin D, 25-Hydroxy, Total  4. S/P gastric bypass Increase weight training to 3 times per week.  Refill- Prenatal Vit-Fe Fum-FA-Omega (PNV PRENATAL PLUS MULTIVIT+DHA) 27-1 & 312 MG MISC; 1 po qd  Dispense: 90 each; Refill: 0  5. Obesity,current BMI 48.1 Increase protein intake, lost 7.6 LB of muscle in 4 weeks.  [name] is currently in the action stage of change. As such, her goal is to continue with weight loss efforts. She has agreed to the Category 3 Plan 110 protein daily.   Exercise goals: All adults should avoid inactivity. Some physical activity is better than none, and adults who participate in any amount of physical activity gain some health benefits.  Behavioral modification strategies: increasing lean protein intake, increasing vegetables, increasing water intake, decreasing eating out, no skipping meals, meal planning and cooking strategies, holiday eating strategies , and planning for success.  [name] has agreed to follow-up with our clinic in 3-4 weeks. She was informed of the importance of frequent follow-up visits to maximize her success with intensive lifestyle modifications for her multiple health conditions.   Objective:   Blood pressure 128/87, pulse 95, temperature 98.2 F (36.8  C), height 6\' 1"  (1.854 m), weight (!) 364 lb (165.1 kg), SpO2 99 %. Body mass index is 48.02 kg/m.  General: Cooperative, alert, well developed, in no acute distress. HEENT: Conjunctivae and lids unremarkable. Cardiovascular: Regular rhythm.  Lungs: Normal work of  breathing. Neurologic: No focal deficits.   Lab Results  Component Value Date   CREATININE 0.85 12/31/2021   BUN 7 12/31/2021   NA 137 12/31/2021   K 3.9 12/31/2021   CL 101 12/31/2021   CO2 27 12/31/2021   Lab Results  Component Value Date   ALT 20 12/31/2021   AST 23 12/31/2021   ALKPHOS 50 12/31/2021   BILITOT 0.5 12/31/2021   Lab Results  Component Value Date   HGBA1C 5.7 (H) 06/01/2022   HGBA1C 6.0 (H) 01/12/2022   HGBA1C 6.0 (H) 10/05/2021   HGBA1C 5.8 (H) 06/20/2021   HGBA1C 6.7 (H) 02/01/2021   Lab Results  Component Value Date   INSULIN 11.0 01/12/2022   INSULIN 22.7 02/01/2021   Lab Results  Component Value Date   TSH 1.090 09/06/2021   Lab Results  Component Value Date   CHOL 189 12/05/2021   HDL 61 12/05/2021   LDLCALC 107 (H) 12/05/2021   TRIG 119 12/05/2021   CHOLHDL 3.1 12/05/2021   Lab Results  Component Value Date   VD25OH 46.1 01/12/2022   VD25OH 37.2 10/05/2021   VD25OH 22.9 (L) 06/20/2021   Lab Results  Component Value Date   WBC 6.2 12/31/2021   HGB 12.8 12/31/2021   HCT 40.5 12/31/2021   MCV 86.9 12/31/2021   PLT 364 12/31/2021   Lab Results  Component Value Date   IRON 30 06/20/2021   TIBC 444 06/20/2021   FERRITIN 8 (L) 06/20/2021   Attestation Statements:   Reviewed by clinician on day of visit: allergies, medications, problem list, medical history, surgical history, family history, social history, and previous encounter notes.  I, Davy Pique, am acting as Location manager for Loyal Gambler, DO.  I have reviewed the above documentation for accuracy and completeness, and I agree with the above. Dell Ponto, DO

## 2022-06-09 DIAGNOSIS — M25519 Pain in unspecified shoulder: Secondary | ICD-10-CM | POA: Diagnosis not present

## 2022-06-10 DIAGNOSIS — Z1152 Encounter for screening for COVID-19: Secondary | ICD-10-CM | POA: Diagnosis not present

## 2022-06-12 DIAGNOSIS — M25519 Pain in unspecified shoulder: Secondary | ICD-10-CM | POA: Diagnosis not present

## 2022-06-13 DIAGNOSIS — M25519 Pain in unspecified shoulder: Secondary | ICD-10-CM | POA: Diagnosis not present

## 2022-06-14 ENCOUNTER — Ambulatory Visit (INDEPENDENT_AMBULATORY_CARE_PROVIDER_SITE_OTHER): Payer: Medicaid Other | Admitting: Family Medicine

## 2022-06-14 ENCOUNTER — Encounter (INDEPENDENT_AMBULATORY_CARE_PROVIDER_SITE_OTHER): Payer: Self-pay | Admitting: Family Medicine

## 2022-06-14 VITALS — BP 128/83 | HR 95 | Temp 99.0°F | Ht 73.0 in | Wt 354.6 lb

## 2022-06-14 DIAGNOSIS — Z7984 Long term (current) use of oral hypoglycemic drugs: Secondary | ICD-10-CM

## 2022-06-14 DIAGNOSIS — M25519 Pain in unspecified shoulder: Secondary | ICD-10-CM | POA: Diagnosis not present

## 2022-06-14 DIAGNOSIS — K5909 Other constipation: Secondary | ICD-10-CM | POA: Diagnosis not present

## 2022-06-14 DIAGNOSIS — E559 Vitamin D deficiency, unspecified: Secondary | ICD-10-CM

## 2022-06-14 DIAGNOSIS — E1169 Type 2 diabetes mellitus with other specified complication: Secondary | ICD-10-CM | POA: Diagnosis not present

## 2022-06-14 DIAGNOSIS — Z6841 Body Mass Index (BMI) 40.0 and over, adult: Secondary | ICD-10-CM

## 2022-06-14 DIAGNOSIS — Z7985 Long-term (current) use of injectable non-insulin antidiabetic drugs: Secondary | ICD-10-CM | POA: Diagnosis not present

## 2022-06-14 DIAGNOSIS — E669 Obesity, unspecified: Secondary | ICD-10-CM

## 2022-06-14 MED ORDER — METFORMIN HCL 1000 MG PO TABS: 1000.0000 mg | ORAL_TABLET | Freq: Two times a day (BID) | ORAL | 0 refills | Status: DC

## 2022-06-14 MED ORDER — SEMAGLUTIDE (1 MG/DOSE) 4 MG/3ML ~~LOC~~ SOPN: 1.0000 mg | PEN_INJECTOR | SUBCUTANEOUS | 0 refills | Status: DC

## 2022-06-15 DIAGNOSIS — M25519 Pain in unspecified shoulder: Secondary | ICD-10-CM | POA: Diagnosis not present

## 2022-06-16 DIAGNOSIS — M25519 Pain in unspecified shoulder: Secondary | ICD-10-CM | POA: Diagnosis not present

## 2022-06-19 DIAGNOSIS — M25519 Pain in unspecified shoulder: Secondary | ICD-10-CM | POA: Diagnosis not present

## 2022-06-20 DIAGNOSIS — M25519 Pain in unspecified shoulder: Secondary | ICD-10-CM | POA: Diagnosis not present

## 2022-06-21 DIAGNOSIS — M25519 Pain in unspecified shoulder: Secondary | ICD-10-CM | POA: Diagnosis not present

## 2022-06-22 DIAGNOSIS — M25519 Pain in unspecified shoulder: Secondary | ICD-10-CM | POA: Diagnosis not present

## 2022-06-23 DIAGNOSIS — M25519 Pain in unspecified shoulder: Secondary | ICD-10-CM | POA: Diagnosis not present

## 2022-06-26 DIAGNOSIS — M25519 Pain in unspecified shoulder: Secondary | ICD-10-CM | POA: Diagnosis not present

## 2022-06-27 DIAGNOSIS — M25519 Pain in unspecified shoulder: Secondary | ICD-10-CM | POA: Diagnosis not present

## 2022-06-28 DIAGNOSIS — M25519 Pain in unspecified shoulder: Secondary | ICD-10-CM | POA: Diagnosis not present

## 2022-06-29 DIAGNOSIS — M25519 Pain in unspecified shoulder: Secondary | ICD-10-CM | POA: Diagnosis not present

## 2022-06-30 DIAGNOSIS — M25519 Pain in unspecified shoulder: Secondary | ICD-10-CM | POA: Diagnosis not present

## 2022-06-30 NOTE — Progress Notes (Unsigned)
Chief Complaint:   OBESITY Karen Brennan is here to discuss her progress with her obesity treatment plan along with follow-up of her obesity related diagnoses. Karen Brennan is on the Category 3 Plan +110 protein daily and states she is following her eating plan approximately 80% of the time. Karen Brennan states she is bike, walking, weights 30 minutes 3 times per week.  Today's visit was #: 39 Starting weight: 443 LBS Starting date: 02/01/2022 Today's weight: 354 LBS Today's date: 06/14/2022 Total lbs lost to date: 69 LBS Total lbs lost since last in-office visit: 10 LBS  Interim History: Patient did excellent with protein intake and is resisting temptations.  She is also drinking a lot of water about 120 ounces per day.  No hunger or cravings.  Patient gained almost 4 pounds of muscle mass and lost 14 pounds of fat mass since 12/21.  Subjective:   1. Chronic constipation Symptoms are well-controlled with H2O intake and with increase in exercise.  However patient is still using Linzess daily.  2. Type 2 diabetes mellitus with obesity ([place]) Discussed labs with patient today. Patient is asymptomatic and doing very well on medication.  No concerns or issues.  Patient denies need for dose change in medication.  3. Vitamin D deficiency Discussed labs with patient today. Vitamin D level is much improved at 43.  Patient has been taking it twice weekly and has been feeling much better.  Assessment/Plan:  No orders of the defined types were placed in this encounter.   Medications Discontinued During This Encounter  Medication Reason   Semaglutide, 1 MG/DOSE, 4 MG/3ML SOPN Reorder   metFORMIN (GLUCOPHAGE) 1000 MG tablet Reorder     Meds ordered this encounter  Medications   metFORMIN (GLUCOPHAGE) 1000 MG tablet    Sig: Take 1 tablet (1,000 mg total) by mouth 2 (two) times daily with a meal.    Dispense:  60 tablet    Refill:  0   Semaglutide, 1 MG/DOSE, 4 MG/3ML SOPN    Sig: Inject 1 mg as directed  once a week.    Dispense:  3 mL    Refill:  0     1. Chronic constipation Continue adequate hydration, exercise and Linzess daily.  2. Type 2 diabetes mellitus with obesity (HCC) A1c is now at 5.7, which is excellent.  Continue Ozempic and metformin.  Continue PNP and weight loss.  Continue increased exercise as tolerated.  Refill- metFORMIN (GLUCOPHAGE) 1000 MG tablet; Take 1 tablet (1,000 mg total) by mouth 2 (two) times daily with a meal.  Dispense: 60 tablet; Refill: 0  Refill- Semaglutide, 1 MG/DOSE, 4 MG/3ML SOPN; Inject 1 mg as directed once a week.  Dispense: 3 mL; Refill: 0  3. Vitamin D deficiency Continue twice weekly ergocalciferol.  No need for refill today per patient.  4. Obesity,current BMI 46.8 No change in meal plan.  Continue to exercise and increase his activity as tolerated.  Continue to get all foods and proteins and and do not skip meals.  Karen Brennan is currently in the action stage of change. As such, her goal is to continue with weight loss efforts. She has agreed to the Category 3 Plan +110 protein daily.   Exercise goals:  Increase activity as tolerated and encouraged resistance training.  Behavioral modification strategies: meal planning and cooking strategies and planning for success.  Karen Brennan has agreed to follow-up with our clinic in 3 weeks. She was informed of the importance of frequent follow-up visits to maximize  her success with intensive lifestyle modifications for her multiple health conditions.   Objective:   Blood pressure 128/83, pulse 95, temperature 99 F (37.2 C), height 6\' 1"  (1.854 m), weight (!) 354 lb 9.6 oz (160.8 kg), SpO2 96 %. Body mass index is 46.78 kg/m.  General: Cooperative, alert, well developed, in no acute distress. HEENT: Conjunctivae and lids unremarkable. Cardiovascular: Regular rhythm.  Lungs: Normal work of breathing. Neurologic: No focal deficits.   Lab Results  Component Value Date   CREATININE 0.85 12/31/2021    BUN 7 12/31/2021   NA 137 12/31/2021   K 3.9 12/31/2021   CL 101 12/31/2021   CO2 27 12/31/2021   Lab Results  Component Value Date   ALT 20 12/31/2021   AST 23 12/31/2021   ALKPHOS 50 12/31/2021   BILITOT 0.5 12/31/2021   Lab Results  Component Value Date   HGBA1C 5.7 (H) 06/01/2022   HGBA1C 6.0 (H) 01/12/2022   HGBA1C 6.0 (H) 10/05/2021   HGBA1C 5.8 (H) 06/20/2021   HGBA1C 6.7 (H) 02/01/2021   Lab Results  Component Value Date   INSULIN 11.0 01/12/2022   INSULIN 22.7 02/01/2021   Lab Results  Component Value Date   TSH 1.090 09/06/2021   Lab Results  Component Value Date   CHOL 189 12/05/2021   HDL 61 12/05/2021   LDLCALC 107 (H) 12/05/2021   TRIG 119 12/05/2021   CHOLHDL 3.1 12/05/2021   Lab Results  Component Value Date   VD25OH 46.1 01/12/2022   VD25OH 37.2 10/05/2021   VD25OH 22.9 (L) 06/20/2021   Lab Results  Component Value Date   WBC 6.2 12/31/2021   HGB 12.8 12/31/2021   HCT 40.5 12/31/2021   MCV 86.9 12/31/2021   PLT 364 12/31/2021   Lab Results  Component Value Date   IRON 30 06/20/2021   TIBC 444 06/20/2021   FERRITIN 8 (L) 06/20/2021   Attestation Statements:   Reviewed by clinician on day of visit: allergies, medications, problem list, medical history, surgical history, family history, social history, and previous encounter notes.  I, Davy Pique, RMA, am acting as Location manager for [employer], DO.   I have reviewed the above documentation for accuracy and completeness, and I agree with the above. Marjory Sneddon, D.O.  The [place] was signed into law in 2016 which includes the topic of electronic health records.  This provides immediate access to information in MyChart.  This includes consultation notes, operative notes, office notes, lab results and pathology reports.  If you have any questions about what you read please let us know at your next visit so we can discuss your concerns and take corrective  action if need be.  We are right here with you.

## 2022-07-03 DIAGNOSIS — M25519 Pain in unspecified shoulder: Secondary | ICD-10-CM | POA: Diagnosis not present

## 2022-07-04 ENCOUNTER — Ambulatory Visit
Admission: EM | Admit: 2022-07-04 | Discharge: 2022-07-04 | Disposition: A | Discharge status: Home / Self Care | Payer: Medicaid Other | Attending: Internal Medicine | Admitting: Internal Medicine

## 2022-07-04 ENCOUNTER — Other Ambulatory Visit: Payer: Self-pay

## 2022-07-04 ENCOUNTER — Emergency Department (HOSPITAL_COMMUNITY)
Admission: EM | Admit: 2022-07-04 | Discharge: 2022-07-04 | Disposition: A | Discharge status: Home / Self Care | Payer: Medicaid Other | Attending: Emergency Medicine | Admitting: Emergency Medicine

## 2022-07-04 ENCOUNTER — Emergency Department (HOSPITAL_COMMUNITY): Payer: Medicaid Other

## 2022-07-04 DIAGNOSIS — Z79899 Other long term (current) drug therapy: Secondary | ICD-10-CM | POA: Insufficient documentation

## 2022-07-04 DIAGNOSIS — I10 Essential (primary) hypertension: Secondary | ICD-10-CM | POA: Diagnosis not present

## 2022-07-04 DIAGNOSIS — Z1152 Encounter for screening for COVID-19: Secondary | ICD-10-CM | POA: Diagnosis not present

## 2022-07-04 DIAGNOSIS — J9811 Atelectasis: Secondary | ICD-10-CM | POA: Diagnosis not present

## 2022-07-04 DIAGNOSIS — J45909 Unspecified asthma, uncomplicated: Secondary | ICD-10-CM | POA: Diagnosis not present

## 2022-07-04 DIAGNOSIS — J101 Influenza due to other identified influenza virus with other respiratory manifestations: Secondary | ICD-10-CM | POA: Diagnosis not present

## 2022-07-04 DIAGNOSIS — R000 Tachycardia, unspecified: Secondary | ICD-10-CM | POA: Diagnosis not present

## 2022-07-04 DIAGNOSIS — Z7984 Long term (current) use of oral hypoglycemic drugs: Secondary | ICD-10-CM | POA: Insufficient documentation

## 2022-07-04 DIAGNOSIS — R079 Chest pain, unspecified: Secondary | ICD-10-CM | POA: Diagnosis not present

## 2022-07-04 DIAGNOSIS — J069 Acute upper respiratory infection, unspecified: Secondary | ICD-10-CM | POA: Diagnosis not present

## 2022-07-04 DIAGNOSIS — E119 Type 2 diabetes mellitus without complications: Secondary | ICD-10-CM | POA: Insufficient documentation

## 2022-07-04 DIAGNOSIS — R059 Cough, unspecified: Secondary | ICD-10-CM | POA: Diagnosis not present

## 2022-07-04 DIAGNOSIS — M25519 Pain in unspecified shoulder: Secondary | ICD-10-CM | POA: Diagnosis not present

## 2022-07-04 DIAGNOSIS — R051 Acute cough: Secondary | ICD-10-CM

## 2022-07-04 DIAGNOSIS — R0789 Other chest pain: Secondary | ICD-10-CM

## 2022-07-04 LAB — BASIC METABOLIC PANEL
Anion gap: 15 (ref 5–15)
BUN: 10 mg/dL (ref 6–20)
CO2: 21 mmol/L — ABNORMAL LOW (ref 22–32)
Calcium: 9 mg/dL (ref 8.9–10.3)
Chloride: 101 mmol/L (ref 98–111)
Creatinine, Ser: 0.97 mg/dL (ref 0.44–1.00)
GFR, Estimated: >60 mL/min (ref >60)
Glucose, Bld: 208 mg/dL — ABNORMAL HIGH (ref 70–99)
Potassium: 3.4 mmol/L — ABNORMAL LOW (ref 3.5–5.1)
Sodium: 137 mmol/L (ref 135–145)

## 2022-07-04 LAB — CBC WITH DIFFERENTIAL/PLATELET
Abs Immature Granulocytes: 0.03 10*3/uL (ref 0.00–0.07)
Basophils Absolute: 0 10*3/uL (ref 0.0–0.1)
Basophils Relative: 0 %
Eosinophils Absolute: 0.1 10*3/uL (ref 0.0–0.5)
Eosinophils Relative: 1 %
HCT: 43.4 % (ref 36.0–46.0)
Hemoglobin: 14.1 g/dL (ref 12.0–15.0)
Immature Granulocytes: 0 %
Lymphocytes Relative: 29 %
Lymphs Abs: 3 10*3/uL (ref 0.7–4.0)
MCH: 29.6 pg (ref 26.0–34.0)
MCHC: 32.5 g/dL (ref 30.0–36.0)
MCV: 91.2 fL (ref 80.0–100.0)
Monocytes Absolute: 0.7 10*3/uL (ref 0.1–1.0)
Monocytes Relative: 7 %
Neutro Abs: 6.6 10*3/uL (ref 1.7–7.7)
Neutrophils Relative %: 63 %
Platelets: 354 10*3/uL (ref 150–400)
RBC: 4.76 MIL/uL (ref 3.87–5.11)
RDW: 12.5 % (ref 11.5–15.5)
WBC: 10.4 10*3/uL (ref 4.0–10.5)
nRBC: 0 % (ref 0.0–0.2)

## 2022-07-04 LAB — TROPONIN I (HIGH SENSITIVITY)
Troponin I (High Sensitivity): <2 ng/L (ref <18)
Troponin I (High Sensitivity): <2 ng/L (ref <18)

## 2022-07-04 LAB — RESP PANEL BY RT-PCR (RSV, FLU A&B, COVID)  RVPGX2
Influenza A by PCR: NEGATIVE
Influenza B by PCR: POSITIVE — AB
Resp Syncytial Virus by PCR: NEGATIVE
SARS Coronavirus 2 by RT PCR: NEGATIVE

## 2022-07-04 LAB — HCG, QUANTITATIVE, PREGNANCY: hCG, Beta Chain, Quant, S: 1 m[IU]/mL (ref <5)

## 2022-07-04 MED ORDER — IOHEXOL 350 MG/ML SOLN
75.0000 mL | Freq: Once | INTRAVENOUS | Status: AC | PRN
  Administered 2022-07-04: 75 mL via INTRAVENOUS

## 2022-07-04 MED ORDER — BENZONATATE 100 MG PO CAPS: 100.0000 mg | ORAL_CAPSULE | Freq: Three times a day (TID) | ORAL | 0 refills | Status: DC

## 2022-07-04 MED ORDER — LACTATED RINGERS IV BOLUS
1000.0000 mL | Freq: Once | INTRAVENOUS | Status: AC
  Administered 2022-07-04: 1000 mL via INTRAVENOUS

## 2022-07-04 MED ORDER — ACETAMINOPHEN 500 MG PO TABS
1000.0000 mg | ORAL_TABLET | Freq: Once | ORAL | Status: AC
  Administered 2022-07-04: 1000 mg via ORAL
  Filled 2022-07-04: qty 2

## 2022-07-04 MED ORDER — ACETAMINOPHEN 325 MG PO TABS
650.0000 mg | ORAL_TABLET | Freq: Once | ORAL | Status: AC
  Administered 2022-07-04: 650 mg via ORAL

## 2022-07-04 NOTE — ED Provider Notes (Signed)
[facility]    CSN: [id number] Arrival date & time: 07/04/22  0804      History   Chief Complaint Chief Complaint  Patient presents with   Cough   Pleurisy    HPI Karen Brennan is a 47 y.o. female.   Patient presents with cough, nasal congestion, chest pain, bilateral eye pain and pressure that has been present for about 4 days.  She reports that her chest pain is present all the time but especially when she coughs and swallows.  Reports pain is present in the center of the chest.  Has taken Robitussin for symptoms with minimal improvement.  Reports history of asthma but has not used inhaler for symptoms.  Denies any known fevers or sick contacts at home.  Denies shortness of breath, sore throat, ear pain, nausea, vomiting, diarrhea, abdominal pain.   Cough   Past Medical History:  Diagnosis Date   ANEMIA 08/14/2008   Qualifier: Diagnosis of  By: Karn Pickler MD, Jessica     Anxiety    Asthma    Bipolar 1 disorder (HCC)    Blood transfusion    Cellulitis 07/15/2020   Of right buttock   Chest pain    Depression    Edema of both lower extremities    GERD (gastroesophageal reflux disease)    History of bronchitis    Hypertension    Kidney stones 10/09/2011   Migraines 10/09/2011   "often"   Pre-diabetes    Schizophrenia (HCC)    SOB (shortness of breath)     Patient Active Problem List   Diagnosis Date Noted   Chronic constipation 06/14/2022   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   Class 3 severe obesity with serious comorbidity and body mass index (BMI) of 50.0 to 59.9 in adult (HCC) 05/01/2022   Seasonal allergies 05/01/2022   Type 2 diabetes mellitus with obesity (HCC) 04/10/2022   Absolute anemia 03/13/2022   Hyperlipidemia associated with type 2 diabetes mellitus (HCC) 12/05/2021   B12 deficiency 08/29/2021   Type II diabetes mellitus (HCC) 08/29/2021   Vitamin D deficiency 08/29/2021   Constipation 08/29/2021   PVC's (premature ventricular  contractions) 07/19/2021   Intertrigo 02/16/2020   Tachycardia 07/10/2019   Systolic murmur 07/10/2019   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   Neuropathic pain of both feet 05/09/2016   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   Anxiety 10/25/2011   Bipolar 1 disorder, mixed (HCC) 09/15/2011   Allergic rhinitis 08/31/2008   Microcytic anemia 08/14/2008   Esophageal reflux 01/24/2008   Hypertension associated with diabetes (HCC) 08/17/2006   Morbid obesity with BMI of 50.0-59.9, adult (HCC) 08/09/2006    Past Surgical History:  Procedure Laterality Date   ANKLE FRACTURE SURGERY   1990's   "left; put screws in"   ENDOMETRIAL ABLATION  ~ 2011   FRACTURE SURGERY     GASTRIC BY PASS     TUBAL LIGATION  2006    OB History     Gravida  5   Para  4   Term  4   Preterm      AB  1   Living  4      SAB      IAB      Ectopic  1   Multiple      Live Births               Home Medications    Prior to Admission medications   Medication  Sig Start Date End Date Taking? Authorizing Provider  Accu-Chek Softclix Lancets lancets USE TO test fasting blood sugar EVERY DAY IN THE MORNING 01/05/22   Moses Manners, MD  acetaminophen (TYLENOL) 500 MG tablet Take 1,000 mg by mouth every 6 (six) hours as needed for mild pain.    [provider]  albuterol (VENTOLIN HFA) 108 (90 Base) MCG/ACT inhaler INHALE 2 PUFFS BY MOUTH EVERY 4 HOURS AS NEEDED wheezing OR SHORTNESS OF BREATH 04/18/22   Littie Deeds, MD  atorvastatin (LIPITOR) 20 MG tablet Take 1 tablet (20 mg total) by mouth daily. 12/05/21   Littie Deeds, MD  benztropine (COGENTIN) 1 MG tablet Take 1 mg by mouth 2 (two) times daily. 06/18/19   [provider]  blood glucose meter kit and supplies KIT Dispense based on patient and insurance preference. Use up to four times daily as directed. (FOR ICD-9 250.00, 250.01). 09/06/21   Littie Deeds, MD  Blood Glucose Monitoring  Suppl (ACCU-CHEK GUIDE) w/Device KIT Check blood glucose in AM while fasting 09/07/21   Littie Deeds, MD  busPIRone (BUSPAR) 7.5 MG tablet Take 7.5 mg by mouth 2 (two) times daily. 11/15/21   [provider]  clotrimazole (LOTRIMIN) 1 % cream APPLY TO AFFECTED AREA TWICE A DAY 06/24/21   Littie Deeds, MD  ferrous sulfate 325 (65 FE) MG tablet Take 325 mg by mouth in the morning and at bedtime. 08/25/21   [provider]  FLUoxetine (PROZAC) 40 MG capsule Take 40 mg by mouth daily.    [provider]  fluticasone (FLONASE) 50 MCG/ACT nasal spray place 2 SPRAYS IN EACH NOSTRIL EVERY DAY AS NEEDED allergies OR RHINITIS 05/01/22   Littie Deeds, MD  gabapentin (NEURONTIN) 300 MG capsule Take 1 capsule (300 mg total) by mouth 3 (three) times daily. 04/24/22   Littie Deeds, MD  glucose blood (ACCU-CHEK GUIDE) test strip 1 each by Other route as needed for other. Use as instructed 01/05/22   Moses Manners, MD  GOODSENSE ALLERGY RELIEF 10 MG tablet TAKE 1 Tablet BY MOUTH ONCE DAILY 05/01/22   Littie Deeds, MD  guaiFENesin (MUCINEX) 600 MG 12 hr tablet Take by mouth 2 (two) times daily.    [provider]  haloperidol (HALDOL) 2 MG tablet Take 2 mg by mouth daily.    [provider]  hydrochlorothiazide (HYDRODIURIL) 25 MG tablet TAKE 1 Tablet BY MOUTH ONCE DAILY 05/01/22   Littie Deeds, MD  hydrOXYzine (VISTARIL) 25 MG capsule Take 25 mg by mouth every evening. Every evening as needed 10/24/20   [provider]  Lancet Devices [facility]) lancets Use as instructed 02/16/17   Arvilla Market, MD  linaclotide [facility]) 72 MCG capsule Take 1 capsule (72 mcg total) by mouth daily before breakfast. 06/01/22   Bowen, Scot Jun, DO  metFORMIN (GLUCOPHAGE) 1000 MG tablet Take 1 tablet (1,000 mg total) by mouth 2 (two) times daily with a meal. 06/14/22   Opalski, Gavin Pound, DO  methocarbamol (ROBAXIN) 500 MG tablet Take 1 tablet (500 mg total) by mouth 2  (two) times daily. 02/08/22   Mannie Stabile, PA-C  metoprolol tartrate (LOPRESSOR) 25 MG tablet TAKE ONE-HALF TABLET BY MOUTH TWICE DAILY AS NEEDED 06/02/22   Littie Deeds, MD  naproxen (NAPROSYN) 500 MG tablet Take 1 tablet (500 mg total) by mouth 2 (two) times daily. 02/08/22   Mannie Stabile, PA-C  nystatin (MYCOSTATIN/NYSTOP) powder Apply 1 Application topically 3 (three) times daily. 02/01/22  Lilland, Alana, DO  OLANZapine (ZYPREXA) 20 MG tablet Take 20 mg by mouth at bedtime.     [provider]  Prenatal Vit-Fe Fum-FA-Omega (PNV PRENATAL PLUS MULTIVIT+DHA) 27-1 & 312 MG MISC 1 po qd 06/01/22   Bowen, Collene Leyden, DO  Prenatal Vit-Fe Fumarate-FA (M-NATAL PLUS) 27-1 MG TABS Take 1 tablet by mouth daily. 05/26/22   [provider]  Probiotic Product (ACIDOPHILUS PROBIOTIC BLEND PO) Take by mouth.    [provider]  Semaglutide, 1 MG/DOSE, 4 MG/3ML SOPN Inject 1 mg as directed once a week. 06/14/22   Mellody Dance, DO  traZODone (DESYREL) 50 MG tablet Take 50 mg by mouth at bedtime as needed for sleep. 04/07/19   [provider]  valsartan (DIOVAN) 80 MG tablet Take 1 tablet (80 mg total) by mouth at bedtime. 03/30/22   Zola Button, MD  vitamin B-12 (CYANOCOBALAMIN) 1000 MCG tablet TAKE 1 Tablet BY MOUTH ONCE DAILY 11/16/21   Zola Button, MD  Vitamin D, Ergocalciferol, (DRISDOL) 1.25 MG (50000 UNIT) CAPS capsule 1 tab q wed and 1 tab q sun 06/01/22   Bowen, Collene Leyden, DO    Family History Family History  Problem Relation Age of Onset   Heart failure Mother    Hyperlipidemia Mother    Hypertension Mother    Diabetes Mother    Sudden death Mother    Depression Mother    Bipolar disorder Mother    Schizophrenia Mother    Hypertension Father     Social History Social History   Tobacco Use   Smoking status: Former    Packs/day: 0.25    Years: 4.00    Total pack years: 1.00    Types: Cigarettes    Quit date: 06/12/2004    Years since  quitting: 18.0   Smokeless tobacco: Never  Vaping Use   Vaping Use: Never used  Substance Use Topics   Alcohol use: No   Drug use: No     Allergies   Sibutramine hcl monohydrate   Review of Systems Review of Systems Per HPI  Physical Exam Triage Vital Signs ED Triage Vitals  Enc Vitals Group     BP 07/04/22 0834 118/78     Pulse Rate 07/04/22 0833 (!) 118     Resp 07/04/22 0833 19     Temp 07/04/22 0833 99.1 F (37.3 C)     Temp src --      SpO2 07/04/22 0833 97 %     Weight --      Height --      Head Circumference --      Peak Flow --      Pain Score 07/04/22 0831 6     Pain Loc --      Pain Edu? --      Excl. in [place]? --    No data found.  Updated Vital Signs BP 118/78   Pulse (!) 118   Temp 99.1 F (37.3 C)   Resp 19   LMP 05/22/2022 (Approximate)   SpO2 97%   Visual Acuity Right Eye Distance:   Left Eye Distance:   Bilateral Distance:    Right Eye Near:   Left Eye Near:    Bilateral Near:     Physical Exam Constitutional:      General: She is not in acute distress.    Appearance: Normal appearance. She is not toxic-appearing or diaphoretic.  HENT:     Head: Normocephalic and atraumatic.  Right Ear: Tympanic membrane and ear canal normal.     Left Ear: Tympanic membrane and ear canal normal.     Nose: Congestion present.     Mouth/Throat:     Mouth: Mucous membranes are moist.     Pharynx: No posterior oropharyngeal erythema.  Eyes:     Extraocular Movements: Extraocular movements intact.     Conjunctiva/sclera: Conjunctivae normal.     Pupils: Pupils are equal, round, and reactive to light.  Cardiovascular:     Rate and Rhythm: Regular rhythm. Tachycardia present.     Pulses: Normal pulses.     Heart sounds: Normal heart sounds.  Pulmonary:     Effort: Pulmonary effort is normal. No respiratory distress.     Breath sounds: Normal breath sounds. No stridor. No wheezing, rhonchi or rales.  Abdominal:     General: Abdomen is flat.  Bowel sounds are normal.     Palpations: Abdomen is soft.  Musculoskeletal:        General: Normal range of motion.     Cervical back: Normal range of motion.  Skin:    General: Skin is warm and dry.  Neurological:     General: No focal deficit present.     Mental Status: She is alert and oriented to person, place, and time. Mental status is at baseline.  Psychiatric:        Mood and Affect: Mood normal.        Behavior: Behavior normal.      UC Treatments / Results  Labs (all labs ordered are listed, but only abnormal results are displayed) Labs Reviewed - No data to display  EKG   Radiology No results found.  Procedures Procedures (including critical care time)  Medications Ordered in UC Medications  acetaminophen (TYLENOL) tablet 650 mg (650 mg Oral Given 07/04/22 0847)    Initial Impression / Assessment and Plan / UC Course  I have reviewed the triage vital signs and the nursing notes.  Pertinent labs & imaging results that were available during my care of the patient were reviewed by me and considered in my medical decision making (see chart for details).     Patient's symptoms are most likely viral in etiology but there is concern given that patient is having constant chest pain and associated tachycardia.  Patient has low-grade temp at 99.1 which could be contributing to tachycardia but given chest pain is constant, this is concern for cardiac etiology.  Therefore, EKG was completed.  There was nonspecific T wave abnormality as well as changes in leads III and aVR when compared to previous EKGs.  Therefore, I do think that cardiac etiology needs to be ruled out which cannot be provided here in urgent care.  The patient was advised to go to the ER for further evaluation and management and was agreeable with plan.  Suggested EMS transport given tachycardia and chest discomfort but patient declined and wished to self transport.  Risks associated with not going by EMS  were discussed with patient.  Patient voiced understanding and accepted risks.  Patient left via self transport. Final Clinical Impressions(s) / UC Diagnoses   Final diagnoses:  Other chest pain  Viral upper respiratory tract infection with cough  Tachycardia     Discharge Instructions      Go to the emergency department as soon as you leave urgent care for further evaluation and management.     ED Prescriptions   None    PDMP not reviewed this  encounter.   Teodora Medici, [place] 07/04/22 [phone]

## 2022-07-04 NOTE — ED Provider Notes (Signed)
[place] EMERGENCY DEPARTMENT AT [facility] Provider Note   CSN: [id number] Arrival date & time: 07/04/22  1002     History  Chief Complaint  Patient presents with   Cough   Chest Pain   Dizziness    Karen Brennan is a 47 y.o. female with HTN, T2DM, PVCs, HLD, obesity s/p gastric bypass, constipation, anemia who presents with cough, CP, cizziness.    Pt complains of cough which has been present for 2 to 3 days.  It is nonproductive.  Does not have any known sick contacts.  Presented to urgent care and was sent here for chest pain and EKG changes.  States she has a burning sensation and what she describes as her trachea. The pain started acutely today after a particularly hard bout of coughing. She has the pain constantly but it is worse with swallowing and coughing.  She does have minimal shortness of breath with exertion.  Denies f/c, nausea/vomiting, diarrhea, urinary symptoms, lower extremity edema. Patient has no h/o similar. She states she has not been eating/drinking very well while she has been sick.   Cough Associated symptoms: chest pain   Chest Pain Associated symptoms: cough and dizziness   Dizziness Associated symptoms: chest pain        Home Medications Prior to Admission medications   Medication Sig Start Date End Date Taking? Authorizing Provider  benzonatate (TESSALON) 100 MG capsule Take 1 capsule (100 mg total) by mouth every 8 (eight) hours. 07/04/22  Yes Margarita Grizzle, MD  Accu-Chek Softclix Lancets lancets USE TO test fasting blood sugar EVERY DAY IN THE MORNING 01/05/22   Moses Manners, MD  acetaminophen (TYLENOL) 500 MG tablet Take 1,000 mg by mouth every 6 (six) hours as needed for mild pain.    [provider]  albuterol (VENTOLIN HFA) 108 (90 Base) MCG/ACT inhaler INHALE 2 PUFFS BY MOUTH EVERY 4 HOURS AS NEEDED wheezing OR SHORTNESS OF BREATH 04/18/22   Littie Deeds, MD  atorvastatin (LIPITOR) 20 MG tablet Take 1 tablet (20 mg  total) by mouth daily. 12/05/21   Littie Deeds, MD  azithromycin (ZITHROMAX Z-PAK) 250 MG tablet Take 2 tab on day one the 1 tab daily for 4 days 07/06/22   Ronney Asters, NP  benztropine (COGENTIN) 1 MG tablet Take 1 mg by mouth 2 (two) times daily. 06/18/19   [provider]  blood glucose meter kit and supplies KIT Dispense based on patient and insurance preference. Use up to four times daily as directed. (FOR ICD-9 250.00, 250.01). 09/06/21   Littie Deeds, MD  Blood Glucose Monitoring Suppl (ACCU-CHEK GUIDE) w/Device KIT Check blood glucose in AM while fasting 09/07/21   Littie Deeds, MD  busPIRone (BUSPAR) 7.5 MG tablet Take 7.5 mg by mouth 2 (two) times daily. 11/15/21   [provider]  clotrimazole (LOTRIMIN) 1 % cream APPLY TO AFFECTED AREA TWICE A DAY 06/24/21   Littie Deeds, MD  ferrous sulfate 325 (65 FE) MG tablet Take 325 mg by mouth in the morning and at bedtime. 08/25/21   [provider]  FLUoxetine (PROZAC) 40 MG capsule Take 40 mg by mouth daily.    [provider]  fluticasone (FLONASE) 50 MCG/ACT nasal spray place 2 SPRAYS IN EACH NOSTRIL EVERY DAY AS NEEDED allergies OR RHINITIS 05/01/22   Littie Deeds, MD  gabapentin (NEURONTIN) 300 MG capsule Take 1 capsule (300 mg total) by mouth 3 (three) times daily. 04/24/22   Littie Deeds, MD  glucose blood (ACCU-CHEK GUIDE) test strip 1 each by Other route as needed for other. Use as instructed 01/05/22   Zenia Resides, MD  GOODSENSE ALLERGY RELIEF 10 MG tablet TAKE 1 Tablet BY MOUTH ONCE DAILY 05/01/22   Zola Button, MD  guaiFENesin (MUCINEX) 600 MG 12 hr tablet Take by mouth 2 (two) times daily.    [provider]  haloperidol (HALDOL) 2 MG tablet Take 2 mg by mouth daily.    [provider]  hydrochlorothiazide (HYDRODIURIL) 25 MG tablet TAKE 1 Tablet BY MOUTH ONCE DAILY 05/01/22   Zola Button, MD  hydrOXYzine (VISTARIL) 25 MG capsule Take 25 mg by mouth every evening. Every evening  as needed 10/24/20   [provider]  Lancet Devices [facility]) lancets Use as instructed 02/16/17   Nicolette Bang, MD  linaclotide Rolan Lipa) 72 MCG capsule Take 1 capsule (72 mcg total) by mouth daily before breakfast. 07/12/22   Opalski, Neoma Laming, DO  metFORMIN (GLUCOPHAGE) 1000 MG tablet Take 1 tablet (1,000 mg total) by mouth 2 (two) times daily with a meal. 07/12/22   Opalski, Neoma Laming, DO  methocarbamol (ROBAXIN) 500 MG tablet Take 1 tablet (500 mg total) by mouth 2 (two) times daily. 02/08/22   Suzy Bouchard, PA-C  metoprolol tartrate (LOPRESSOR) 25 MG tablet TAKE ONE-HALF TABLET BY MOUTH TWICE DAILY AS NEEDED 06/02/22   Zola Button, MD  naproxen (NAPROSYN) 500 MG tablet Take 1 tablet (500 mg total) by mouth 2 (two) times daily. 02/08/22   Suzy Bouchard, PA-C  nystatin (MYCOSTATIN/NYSTOP) powder Apply 1 Application topically 3 (three) times daily. 02/01/22   Lilland, Alana, DO  OLANZapine (ZYPREXA) 20 MG tablet Take 20 mg by mouth at bedtime.     [provider]  Prenatal Vit-Fe Fum-FA-Omega (PNV PRENATAL PLUS MULTIVIT+DHA) 27-1 & 312 MG MISC 1 po qd 07/12/22   Opalski, Neoma Laming, DO  Probiotic Product (ACIDOPHILUS PROBIOTIC BLEND PO) Take by mouth.    [provider]  Semaglutide, 1 MG/DOSE, 4 MG/3ML SOPN Inject 1 mg as directed once a week. 07/12/22   Mellody Dance, DO  traZODone (DESYREL) 50 MG tablet Take 50 mg by mouth at bedtime as needed for sleep. 04/07/19   [provider]  valsartan (DIOVAN) 80 MG tablet Take 1 tablet (80 mg total) by mouth at bedtime. 03/30/22   Zola Button, MD  vitamin B-12 (CYANOCOBALAMIN) 1000 MCG tablet TAKE 1 Tablet BY MOUTH ONCE DAILY 11/16/21   Zola Button, MD  Vitamin D, Ergocalciferol, (DRISDOL) 1.25 MG (50000 UNIT) CAPS capsule 1 tab q wed and 1 tab q sun 07/12/22   Opalski, Neoma Laming, DO      Allergies    Sibutramine hcl monohydrate    Review of Systems   Review of Systems  Respiratory:   Positive for cough.   Cardiovascular:  Positive for chest pain.  Neurological:  Positive for dizziness.   Review of systems Negative for f/c.  A 10 point review of systems was performed and is negative unless otherwise reported in HPI.  Physical Exam Updated Vital Signs BP 101/75 (BP Location: Right Arm)   Pulse 88   Temp 98.7 F (37.1 C) (Oral)   Resp 17   Ht 6\' 1"  (1.854 m)   Wt (!) 158.8 kg   LMP 05/22/2022 (Approximate)   SpO2 98%   BMI 46.18 kg/m  Physical Exam General: Normal appearing female, lying in bed.  HEENT: Sclera anicteric, MMM, trachea midline.  Cardiology: RRR, no murmurs/rubs/gallops. BL  radial and DP pulses equal bilaterally. No chest tenderness to palpation, no crepitus to palpation. Resp: Normal respiratory rate and effort. CTAB, no wheezes, rhonchi, crackles.  Abd: Soft, non-tender, non-distended. No rebound tenderness or guarding.  GU: Deferred. MSK: No peripheral edema or signs of trauma. Extremities without deformity or TTP. No cyanosis or clubbing. Skin: warm, dry. No rashes or lesions. Neuro: A&Ox4, CNs II-XII grossly intact. MAEs. Sensation grossly intact.  Psych: Normal mood and affect.   ED Results / Procedures / Treatments   Labs (all labs ordered are listed, but only abnormal results are displayed) Labs Reviewed  RESP PANEL BY RT-PCR (RSV, FLU A&B, COVID)  RVPGX2 - Abnormal; Notable for the following components:      Result Value   Influenza B by PCR POSITIVE (*)    All other components within normal limits  BASIC METABOLIC PANEL - Abnormal; Notable for the following components:   Potassium 3.4 (*)    CO2 21 (*)    Glucose, Bld 208 (*)    All other components within normal limits  CBC WITH DIFFERENTIAL/PLATELET  HCG, QUANTITATIVE, PREGNANCY  I-STAT BETA HCG BLOOD, ED ([facility], [facility], [facility] ONLY)  TROPONIN I (HIGH SENSITIVITY)  TROPONIN I (HIGH SENSITIVITY)    EKG EKG Interpretation  Date/Time:  Tuesday July 04 2022 11:00:44  EST Ventricular Rate:  123 PR Interval:    QRS Duration: 88 QT Interval:  334 QTC Calculation: 478 R Axis:   63 Text Interpretation: Sinus tachycardia Confirmed by Cindee Lame [phone]) on 07/04/2022 12:58:23 PM  Radiology CXR: IMPRESSION: No active cardiopulmonary disease.  Procedures Procedures    Medications Ordered in ED Medications  lactated ringers bolus 1,000 mL (0 mLs Intravenous Stopped 07/04/22 1437)  acetaminophen (TYLENOL) tablet 1,000 mg (1,000 mg Oral Given 07/04/22 1242)  iohexol (OMNIPAQUE) 350 MG/ML injection 75 mL (75 mLs Intravenous Contrast Given 07/04/22 1555)    ED Course/ Medical Decision Making/ A&P                          Medical Decision Making Amount and/or Complexity of Data Reviewed Labs: ordered. Decision-making details documented in ED Course. Radiology: ordered.  Risk OTC drugs. Prescription drug management.    This patient presents to the ED for concern of acute onset chest pain with coughing, this involves an extensive number of treatment options, and is a complaint that carries with it a high risk of complications and morbidity.  I considered the following differential and admission for this acute, potentially life threatening condition.   MDM:    Patient with history of acute onset severe chest pain that started a result of significant cough now with pain when she coughs breathes or swallows is concerning for an esophageal or tracheal injury or possible mediastinitis. Will obtain CT w chest. Reportedly had EKG changes at urgent care but there are no signs of ischemia on the EKG here, just demonstrates sinus tachycardia. Consider respiratory viral illness, pneumonia, bronchitis, pleural effusion. Has had no signs/symptoms of DVT but cannot PERC out due to tachycardia, also consider PE w/ CP/DOE/cough. Consider hypovolemia/electrolyte abnormalities, renal injury in s/o decreased PO intake. Will treat with fluids.   Clinical Course as of  07/13/22 2242  Tue Jul 04, 2022  1114 WBC: 10.4 No leukocytosis [HN]  1256 Troponin I (High Sensitivity): <2 [HN]  1256 Influenza B By PCR(!): POSITIVE [HN]  1421 Pulse Rate: 97 Decreased w/ fluids [HN]  1455 HCG, Beta Chain, Quant, S:  1 Neg [HN]  6387 Patient is signed out to the oncoming ED physician who is made aware of her history, presentation, exam, workup, and plan.  Plan is to evaluate CT chest. If negative patient can go home with OTC cough remedies. [HN]  1757 No acute abnormalities are noted on CT chest patient has chronic right hemidiaphragm elevation [DR]    Clinical Course User Index [DR] Pattricia Boss, MD [HN] Audley Hose, MD    Labs: I Ordered, and personally interpreted labs.  The pertinent results include:  those listed above  Imaging Studies ordered: I ordered imaging studies including CXR, CT chest I independently visualized and interpreted imaging. I agree with the radiologist interpretation  Additional history obtained from daughter at bedside, chart review.    Cardiac Monitoring: The patient was maintained on a cardiac monitor.  I personally viewed and interpreted the cardiac monitored which showed an underlying rhythm of: NSR  Reevaluation: After the interventions noted above, I reevaluated the patient and found that they have :improved  Social Determinants of Health: Patient lives independently   Disposition:  Patient is signed out to the oncoming ED physician who is made aware of her history, presentation, exam, workup, and plan.   Co morbidities that complicate the patient evaluation  Past Medical History:  Diagnosis Date   ANEMIA 08/14/2008   Qualifier: Diagnosis of  By: Genene Churn MD, Jessica     Anxiety    Asthma    Bipolar 1 disorder ([place])    Blood transfusion    Cellulitis 07/15/2020   Of right buttock   Chest pain    Depression    Edema of both lower extremities    GERD (gastroesophageal reflux disease)    History of bronchitis     Hypertension    Kidney stones 10/09/2011   Migraines 10/09/2011   "often"   Pre-diabetes    Schizophrenia ([place])    SOB (shortness of breath)      Medicines Meds ordered this encounter  Medications   lactated ringers bolus 1,000 mL   acetaminophen (TYLENOL) tablet 1,000 mg   iohexol (OMNIPAQUE) 350 MG/ML injection 75 mL   benzonatate (TESSALON) 100 MG capsule    Sig: Take 1 capsule (100 mg total) by mouth every 8 (eight) hours.    Dispense:  21 capsule    Refill:  0    I have reviewed the patients home medicines and have made adjustments as needed  Problem List / ED Course: Problem List Items Addressed This Visit   None Visit Diagnoses     Acute cough    -  Primary   Influenza B                       This note was created using dictation software, which may contain spelling or grammatical errors.    Audley Hose, MD 07/13/22 [phone]

## 2022-07-04 NOTE — ED Provider Triage Note (Signed)
Emergency Medicine Provider Triage Evaluation Note  Karen Brennan , a 47 y.o. female  was evaluated in triage.  Pt complains of cough which has been present for 2 to 3 days.  It is nonproductive.  Does not have any known sick contacts.  Presented to urgent care and was sent here for "chest pain" and EKG changes.  States she has a burning sensation and what she describes as her trachea.  Does not have any pain anywhere else in her chest.  States this is only present after she coughs.  She does have minimal shortness of breath with exertion.  Denies feeling fevers at home.  Review of Systems  Positive: As above Negative: As above  Physical Exam  BP 135/85 (BP Location: Right Arm)   Pulse (!) 128   Temp 100.2 F (37.9 C) (Oral)   Resp 20   LMP 05/22/2022 (Approximate)   SpO2 97%  Gen:   Awake, no distress   Resp:  Normal effort  MSK:   Moves extremities without difficulty  Other:    Medical Decision Making  Medically screening exam initiated at 10:19 AM.  Appropriate orders placed.  Karen Brennan was informed that the remainder of the evaluation will be completed by another provider, this initial triage assessment does not replace that evaluation, and the importance of remaining in the ED until their evaluation is complete.  Chest pain workup and respiratory panel initiated   Karen Brennan, Hershal Coria 07/04/22 1021

## 2022-07-04 NOTE — ED Provider Notes (Signed)
  Physical Exam  BP 101/75 (BP Location: Right Arm)   Pulse 88   Temp 98.7 F (37.1 C) (Oral)   Resp 17   Ht 1.854 m (6\' 1" )   Wt (!) 158.8 kg   LMP 05/22/2022 (Approximate)   SpO2 98%   BMI 46.18 kg/m   Physical Exam  Procedures  Procedures  ED Course / MDM   Clinical Course as of 07/04/22 1759  Tue Jul 04, 2022  1114 WBC: 10.4 No leukocytosis [HN]  1256 Troponin I (High Sensitivity): <2 [HN]  1256 Influenza B By PCR(!): POSITIVE [HN]  1421 Pulse Rate: 97 Decreased w/ fluids [HN]  1455 HCG, Beta Chain, Quant, S: 1 Neg [HN]  1611 Patient is signed out to the oncoming ED physician who is made aware of her history, presentation, exam, workup, and plan.  Plan is to evaluate CT chest. If negative patient can go home with OTC cough remedies. [HN]  1757 No acute abnormalities are noted on CT chest patient has chronic right hemidiaphragm elevation [DR]    Clinical Course User Index [DR] Pattricia Boss, MD [HN] Audley Hose, MD   Medical Decision Making Amount and/or Complexity of Data Reviewed Labs: ordered. Decision-making details documented in ED Course. Radiology: ordered.  Risk OTC drugs. Prescription drug management.   47 yo female influenza b cough for several days.  Symptoms yesterday of acute chest pain with coughing.  Sent from [facility] with report of EKG changes. Repeat troponin pending CT chest pending Patient was tachycardiac to 130 now 88 after 1 liter of fluid Check chest ct Repeat troponin normal CT chest without acute abnormality. Patient is hemodynamically stable here in ED.  She has been taking acetaminophen for her symptoms.  She has been taking Robitussin at home that has not worked well. Plan prescription for per [facility] Patient appears stable for discharge she is advised regarding follow-up return precautions and voices understanding     Pattricia Boss, MD 07/04/22 1759

## 2022-07-04 NOTE — Progress Notes (Unsigned)
Cardiology Clinic Note   Patient Name: Karen Brennan Date of Encounter: 07/06/2022  Primary Care Provider:  Littie Deeds, MD Primary Cardiologist:  Bryan Lemma, MD  Patient Profile    Karen Brennan 47 year old female presents to the clinic today for follow-up evaluation of her chest discomfort.  Past Medical History    Past Medical History:  Diagnosis Date   ANEMIA 08/14/2008   Qualifier: Diagnosis of  By: Karn Pickler MD, Jessica     Anxiety    Asthma    Bipolar 1 disorder (HCC)    Blood transfusion    Cellulitis 07/15/2020   Of right buttock   Chest pain    Depression    Edema of both lower extremities    GERD (gastroesophageal reflux disease)    History of bronchitis    Hypertension    Kidney stones 10/09/2011   Migraines 10/09/2011   "often"   Pre-diabetes    Schizophrenia (HCC)    SOB (shortness of breath)    Past Surgical History:  Procedure Laterality Date   ANKLE FRACTURE SURGERY   1990's   "left; put screws in"   ENDOMETRIAL ABLATION  ~ 2011   FRACTURE SURGERY     GASTRIC BY PASS     TUBAL LIGATION  2006    Allergies  Allergies  Allergen Reactions   Sibutramine Hcl Monohydrate Hives    History of Present Illness    Karen Brennan is a PMH of bipolar disorder, HTN, prediabetes, anemia, obesity status post gastric bypass surgery, and tachycardia.  Echocardiogram 2/21 showed normal biventricular function, no significant valvular disease, or a cardiac event monitor for 14 days 3/21 which showed occasional PVCs.  She wore a cardiac monitor for 4 days 6/22 which showed occasional PVCs.  She was referred by Dr. Jennette Kettle for evaluation of her tachycardia.  She was initially seen 07/14/19.  She indicated worsening exercise tolerance and episodes of tachycardia.  She indicated that she was having episodes of heart racing that would last for about 5 minutes and resolved.  They have been happening about once per month.  She reported that she was doing exercise videos  and walking but would become fatigued after a few minutes.  She was getting short of breath with minimal exertion.  She did note occasional chest pain.  She described the pain as aching in the center of her chest.  Her episodes would occur at rest.  She denied chest pain with walking or physical activity.  She had been smoking 1 pack/day x 2 years and quit in 2006.  She did report that her mother died of MI in her 63s.  She was seen by Dr. Bjorn Pippin 06/20/2021.  During that time she was doing well.  She denied chest pain, dyspnea, lower extremity swelling and palpitations.  She did note some lightheadedness with standing.  She had not been checking her blood pressure at home.  She indicated that she had been off of her medications for the last 2 days but at the time of her visit she was resuming her medicine.  She had lost around 17 pounds in the last 6 months.  She was participating with healthy weight and wellness.  She had been exercising 3 times per week using a foot bike and weights.  She was seen by Dr. Tresa Endo 07/19/2021.  During that time she was evaluated for OSA.  She was noted to have AHI 26.3/h.  She was noted to have AHI while supine  17.2/h.  She had a mean oxygen saturation of 94.6% with a minimum O2 saturation of 86%.  She was diagnosed with mild OSA.  CPAP titration was recommended.  She presents to the clinic today for follow-up evaluation and states she contracted flu B approximately 5 days ago.  She presented to urgent care and was sent to the emergency department.  She was evaluated in the emergency department and tested positive for flu B.  She has had a cough for the past several days.  She notices chest discomfort with increased cough.  She reports a productive cough for the last several days.  Her blood pressure is 108/70.  Her pulse initially is 111 and on recheck is 100.  She reports that she has only drank about 1 bottle of water today.  I discussed the importance of increased p.o.  hydration and rest.  She also reports that she had her CPAP taken away due to noncompliance.  The company that delivers the machine picked it up.  I will repeat her split-night sleep study.  She reports that she was not able to wear the facemask and would be willing to wear in the nasal pillows.  Her Epworth score was 7 and a STOP-BANG was noted to be 5.  I will prescribe azithromycin, and have her increase her p.o. fluids, reorder split-night sleep study and plan follow-up in 1 year and as needed.  I have asked her to follow-up with her PCP if she does not notice an improvement in her symptoms in the next 7 days.  Today she denies chest pain, shortness of breath, lower extremity edema, fatigue, palpitations, melena, hematuria, hemoptysis, diaphoresis, weakness, presyncope, syncope, orthopnea, and PND.    Home Medications    Prior to Admission medications   Medication Sig Start Date End Date Taking? Authorizing Provider  Accu-Chek Softclix Lancets lancets USE TO test fasting blood sugar EVERY DAY IN THE MORNING 01/05/22   Moses Manners, MD  acetaminophen (TYLENOL) 500 MG tablet Take 1,000 mg by mouth every 6 (six) hours as needed for mild pain.    [provider]  albuterol (VENTOLIN HFA) 108 (90 Base) MCG/ACT inhaler INHALE 2 PUFFS BY MOUTH EVERY 4 HOURS AS NEEDED wheezing OR SHORTNESS OF BREATH 04/18/22   Littie Deeds, MD  atorvastatin (LIPITOR) 20 MG tablet Take 1 tablet (20 mg total) by mouth daily. 12/05/21   Littie Deeds, MD  benztropine (COGENTIN) 1 MG tablet Take 1 mg by mouth 2 (two) times daily. 06/18/19   [provider]  blood glucose meter kit and supplies KIT Dispense based on patient and insurance preference. Use up to four times daily as directed. (FOR ICD-9 250.00, 250.01). 09/06/21   Littie Deeds, MD  Blood Glucose Monitoring Suppl (ACCU-CHEK GUIDE) w/Device KIT Check blood glucose in AM while fasting 09/07/21   Littie Deeds, MD  busPIRone (BUSPAR) 7.5 MG tablet Take  7.5 mg by mouth 2 (two) times daily. 11/15/21   [provider]  clotrimazole (LOTRIMIN) 1 % cream APPLY TO AFFECTED AREA TWICE A DAY 06/24/21   Littie Deeds, MD  ferrous sulfate 325 (65 FE) MG tablet Take 325 mg by mouth in the morning and at bedtime. 08/25/21   [provider]  FLUoxetine (PROZAC) 40 MG capsule Take 40 mg by mouth daily.    [provider]  fluticasone (FLONASE) 50 MCG/ACT nasal spray place 2 SPRAYS IN EACH NOSTRIL EVERY DAY AS NEEDED allergies OR RHINITIS 05/01/22   Littie Deeds, MD  gabapentin (NEURONTIN) 300 MG capsule Take 1 capsule (300 mg total) by mouth 3 (three) times daily. 04/24/22   Zola Button, MD  glucose blood (ACCU-CHEK GUIDE) test strip 1 each by Other route as needed for other. Use as instructed 01/05/22   Zenia Resides, MD  GOODSENSE ALLERGY RELIEF 10 MG tablet TAKE 1 Tablet BY MOUTH ONCE DAILY 05/01/22   Zola Button, MD  guaiFENesin (MUCINEX) 600 MG 12 hr tablet Take by mouth 2 (two) times daily.    [provider]  haloperidol (HALDOL) 2 MG tablet Take 2 mg by mouth daily.    [provider]  hydrochlorothiazide (HYDRODIURIL) 25 MG tablet TAKE 1 Tablet BY MOUTH ONCE DAILY 05/01/22   Zola Button, MD  hydrOXYzine (VISTARIL) 25 MG capsule Take 25 mg by mouth every evening. Every evening as needed 10/24/20   [provider]  Lancet Devices [facility]) lancets Use as instructed 02/16/17   Nicolette Bang, MD  linaclotide [facility]) 72 MCG capsule Take 1 capsule (72 mcg total) by mouth daily before breakfast. 06/01/22   Bowen, Collene Leyden, DO  metFORMIN (GLUCOPHAGE) 1000 MG tablet Take 1 tablet (1,000 mg total) by mouth 2 (two) times daily with a meal. 06/14/22   Opalski, Neoma Laming, DO  methocarbamol (ROBAXIN) 500 MG tablet Take 1 tablet (500 mg total) by mouth 2 (two) times daily. 02/08/22   Suzy Bouchard, PA-C  metoprolol tartrate (LOPRESSOR) 25 MG tablet TAKE ONE-HALF TABLET BY MOUTH TWICE DAILY AS  NEEDED 06/02/22   Zola Button, MD  naproxen (NAPROSYN) 500 MG tablet Take 1 tablet (500 mg total) by mouth 2 (two) times daily. 02/08/22   Suzy Bouchard, PA-C  nystatin (MYCOSTATIN/NYSTOP) powder Apply 1 Application topically 3 (three) times daily. 02/01/22   Lilland, Alana, DO  OLANZapine (ZYPREXA) 20 MG tablet Take 20 mg by mouth at bedtime.     [provider]  Prenatal Vit-Fe Fum-FA-Omega (PNV PRENATAL PLUS MULTIVIT+DHA) 27-1 & 312 MG MISC 1 po qd 06/01/22   Bowen, Collene Leyden, DO  Prenatal Vit-Fe Fumarate-FA (M-NATAL PLUS) 27-1 MG TABS Take 1 tablet by mouth daily. 05/26/22   [provider]  Probiotic Product (ACIDOPHILUS PROBIOTIC BLEND PO) Take by mouth.    [provider]  Semaglutide, 1 MG/DOSE, 4 MG/3ML SOPN Inject 1 mg as directed once a week. 06/14/22   Mellody Dance, DO  traZODone (DESYREL) 50 MG tablet Take 50 mg by mouth at bedtime as needed for sleep. 04/07/19   [provider]  valsartan (DIOVAN) 80 MG tablet Take 1 tablet (80 mg total) by mouth at bedtime. 03/30/22   Zola Button, MD  vitamin B-12 (CYANOCOBALAMIN) 1000 MCG tablet TAKE 1 Tablet BY MOUTH ONCE DAILY 11/16/21   Zola Button, MD  Vitamin D, Ergocalciferol, (DRISDOL) 1.25 MG (50000 UNIT) CAPS capsule 1 tab q wed and 1 tab q sun 06/01/22   Bowen, Collene Leyden, DO    Family History    Family History  Problem Relation Age of Onset   Heart failure Mother    Hyperlipidemia Mother    Hypertension Mother    Diabetes Mother    Sudden death Mother    Depression Mother    Bipolar disorder Mother    Schizophrenia Mother    Hypertension Father    She indicated that the status of her mother is unknown. She indicated that the status of her father is unknown.  Social History    Social History   Socioeconomic History  Marital status: Single    Spouse name: Not on file   Number of children: Not on file   Years of education: Not on file   Highest education level: Not on file   Occupational History   Occupation: DISABLE  Tobacco Use   Smoking status: Former    Packs/day: 0.25    Years: 4.00    Total pack years: 1.00    Types: Cigarettes    Quit date: 06/12/2004    Years since quitting: 18.0   Smokeless tobacco: Never  Vaping Use   Vaping Use: Never used  Substance and Sexual Activity   Alcohol use: No   Drug use: No   Sexual activity: Yes    Birth control/protection: None    Comment: Novasure  Other Topics Concern   Not on file  Social History Narrative   Not on file   Social Determinants of Health   Financial Resource Strain: Not on file  Food Insecurity: Not on file  Transportation Needs: Not on file  Physical Activity: Not on file  Stress: Not on file  Social Connections: Not on file  Intimate Partner Violence: Not on file     Review of Systems    General:  No chills, fever, night sweats or weight changes.  Cardiovascular:  No chest pain, dyspnea on exertion, edema, orthopnea, palpitations, paroxysmal nocturnal dyspnea. Dermatological: No rash, lesions/masses Respiratory: No cough, dyspnea Urologic: No hematuria, dysuria Abdominal:   No nausea, vomiting, diarrhea, bright red blood per rectum, melena, or hematemesis Neurologic:  No visual changes, wkns, changes in mental status. All other systems reviewed and are otherwise negative except as noted above.  Physical Exam    VS:  BP 108/70   Pulse 100   Ht 6\' 1"  (1.854 m)   Wt 258 lb 9.6 oz (117.3 kg)   LMP 05/22/2022 (Approximate)   SpO2 98%   BMI 34.12 kg/m  , BMI Body mass index is 34.12 kg/m. GEN: Well nourished, well developed, in no acute distress. HEENT: normal. Neck: Supple, no JVD, carotid bruits, or masses. Cardiac: RRR, no murmurs, rubs, or gallops. No clubbing, cyanosis, edema.  Radials/DP/PT 2+ and equal bilaterally.  Respiratory:  Respirations regular and unlabored, clear to auscultation bilaterally. GI: Soft, nontender, nondistended, BS + x 4. MS: no deformity or  atrophy. Skin: warm and dry, no rash. Neuro:  Strength and sensation are intact. Psych: Normal affect.  Accessory Clinical Findings    Recent Labs: 09/06/2021: TSH 1.090 10/05/2021: Magnesium 1.7 12/31/2021: ALT 20 07/04/2022: BUN 10; Creatinine, Ser 0.97; Hemoglobin 14.1; Platelets 354; Potassium 3.4; Sodium 137   Recent Lipid Panel    Component Value Date/Time   CHOL 189 12/05/2021 1406   TRIG 119 12/05/2021 1406   HDL 61 12/05/2021 1406   CHOLHDL 3.1 12/05/2021 1406   CHOLHDL 3.8 12/09/2014 1102   VLDL 25 12/09/2014 1102   LDLCALC 107 (H) 12/05/2021 1406         ECG personally reviewed by me today-none today.  Echocardiogram 07/25/2019  IMPRESSIONS     1. Left ventricular ejection fraction, by estimation, is 60 to 65%. The  left ventricle has normal function. The left ventricle has no regional  wall motion abnormalities. Left ventricular diastolic parameters are  indeterminate.   2. Right ventricular systolic function is normal. The right ventricular  size is normal. Tricuspid regurgitation signal is inadequate for assessing  PA pressure.   3. The mitral valve is normal in structure and function. Trivial mitral  valve  regurgitation. No evidence of mitral stenosis.   4. The aortic valve was not well visualized for morphology but is grossly  normal in function. Aortic valve regurgitation is not visualized. No  aortic stenosis is present.   FINDINGS   Left Ventricle: Left ventricular ejection fraction, by estimation, is 60  to 65%. The left ventricle has normal function. The left ventricle has no  regional wall motion abnormalities. The left ventricular internal cavity  size was normal in size. There is   no left ventricular hypertrophy. Left ventricular diastolic parameters  are indeterminate.   Right Ventricle: The right ventricular size is normal. No increase in  right ventricular wall thickness. Right ventricular systolic function is  normal. Tricuspid  regurgitation signal is inadequate for assessing PA  pressure.   Left Atrium: Left atrial size was normal in size.   Right Atrium: Right atrial size was normal in size.   Pericardium: There is no evidence of pericardial effusion.   Mitral Valve: The mitral valve is normal in structure and function. Normal  mobility of the mitral valve leaflets. Trivial mitral valve regurgitation.  No evidence of mitral valve stenosis.   Tricuspid Valve: The tricuspid valve is normal in structure. Tricuspid  valve regurgitation is not demonstrated. No evidence of tricuspid  stenosis.   Aortic Valve: The aortic valve was not well visualized. Aortic valve  regurgitation is not visualized. No aortic stenosis is present.   Pulmonic Valve: The pulmonic valve was normal in structure. Pulmonic valve  regurgitation is trivial. No evidence of pulmonic stenosis.   Aorta: The aortic root and ascending aorta are structurally normal, with  no evidence of dilitation.   IAS/Shunts: No atrial level shunt detected by color flow Doppler.   Assessment & Plan   1.  Essential hypertension-BP today 108/70 Continue hydrochlorothiazide, metoprolol Reduce HCTZ to 12.5 mg daily Heart healthy low-sodium diet-salty 6 given Increase physical activity as tolerated  Chest pain, MSK chest discomfort-discomfort noted with coughing.  Denies recent episodes of arm neck back or chest discomfort.  Denies exertional symptoms.  With recent trip to the emergency department on 07/04/2022.  Positive for influenza B. Continue current medical therapy No plans for ischemic evaluation at this time. Increase p.o. hydration Rest, may take Tylenol for pain  Hyperlipidemia-LDL 107 on 12/05/21 Continue atorvastatin Heart healthy low-sodium high-fiber diet Increase physical activity as tolerated Follows with PCP  PVCs-reports only occasional brief episodes of palpitations. Avoid triggers caffeine, chocolate, EtOH, dehydration  etc.  OSA-follows with Dr. Claiborne Billings.  Reports noncompliance with CPAP.  CPAP company picked up machine due to her history of noncompliance.  She has a STOP-BANG score of 5 and Epworth score of 7.  Reports that she was unable to wear a facemask due to poor fit.  She requests nasal pillows. Continue weight loss Repeat split-night sleep study  URI, productive cough-yellow productive cough over the last 3-4 days.  Clear to auscultation. Order azithromycin Increase p.o. hydration Continue Tessalon Perles, guaifenesin Follow-up with PCP if symptoms are not improved in 1 week.  Obesity-weight today 108/70.  Status post gastric bypass surgery.  Continues to work with healthy weight and wellness. Continue weight loss  Disposition: Follow-up with Dr. Gardiner Rhyme or me in 1 year.   Jossie Ng. Jaylenn Altier NP-C     07/06/2022, 3:52 PM [address] Office [phone] Fax [phone]    I spent 14 minutes examining this patient, reviewing medications, and using patient centered shared decision making  involving her cardiac care.  Prior to her visit I spent greater than 20 minutes reviewing her past medical history,  medications, and prior cardiac tests.

## 2022-07-04 NOTE — ED Notes (Signed)
Patient is being discharged from the Urgent Care and sent to the Emergency Department via pov with daughter . Per mounf np, patient is in need of higher level of care due to EKG changes . Patient is aware and verbalizes understanding of plan of care.  Vitals:   07/04/22 0833 07/04/22 0834  BP:  118/78  Pulse: (!) 118   Resp: 19   Temp: 99.1 F (37.3 C)   SpO2: 97%

## 2022-07-04 NOTE — ED Triage Notes (Signed)
Pt. Stated, I started coughing about 2-3 days ago and now its making my chest hurt and feeling dizzy.

## 2022-07-04 NOTE — ED Triage Notes (Signed)
Pt presents to [facility] with co of cough congestion chest discomfort and eye pain for 4 days. Pt reports she has been robitussin for symptoms.

## 2022-07-04 NOTE — Discharge Instructions (Signed)
Go to the emergency department as soon as you leave urgent care for further evaluation and management. 

## 2022-07-04 NOTE — Discharge Instructions (Signed)
Drink plenty of fluids Use acetaminophen 650 mg as needed for body aches or fever Tessalon Perle prescription has been sent to your pharmacy you may use this as needed for cough Please follow-up with your doctor in 5 to 10 days Return if you are having worsening symptoms especially shortness of breath, uncontrolled fever, or other new symptoms

## 2022-07-05 DIAGNOSIS — M25519 Pain in unspecified shoulder: Secondary | ICD-10-CM | POA: Diagnosis not present

## 2022-07-06 ENCOUNTER — Ambulatory Visit (INDEPENDENT_AMBULATORY_CARE_PROVIDER_SITE_OTHER): Payer: Medicaid Other | Admitting: General Practice

## 2022-07-06 ENCOUNTER — Encounter: Payer: Self-pay | Admitting: General Practice

## 2022-07-06 VITALS — BP 108/70 | HR 100 | Ht 73.0 in | Wt 258.6 lb

## 2022-07-06 DIAGNOSIS — R079 Chest pain, unspecified: Secondary | ICD-10-CM

## 2022-07-06 DIAGNOSIS — I493 Ventricular premature depolarization: Secondary | ICD-10-CM | POA: Diagnosis not present

## 2022-07-06 DIAGNOSIS — R058 Other specified cough: Secondary | ICD-10-CM | POA: Diagnosis not present

## 2022-07-06 DIAGNOSIS — Z6841 Body Mass Index (BMI) 40.0 and over, adult: Secondary | ICD-10-CM | POA: Diagnosis not present

## 2022-07-06 DIAGNOSIS — I10 Essential (primary) hypertension: Secondary | ICD-10-CM

## 2022-07-06 DIAGNOSIS — J069 Acute upper respiratory infection, unspecified: Secondary | ICD-10-CM

## 2022-07-06 DIAGNOSIS — E785 Hyperlipidemia, unspecified: Secondary | ICD-10-CM | POA: Diagnosis not present

## 2022-07-06 DIAGNOSIS — G4733 Obstructive sleep apnea (adult) (pediatric): Secondary | ICD-10-CM

## 2022-07-06 DIAGNOSIS — M25519 Pain in unspecified shoulder: Secondary | ICD-10-CM | POA: Diagnosis not present

## 2022-07-06 MED ORDER — AZITHROMYCIN 250 MG PO TABS: ORAL_TABLET | ORAL | 0 refills | Status: DC

## 2022-07-06 MED ORDER — AZITHROMYCIN 1 G PO PACK: 1.0000 g | PACK | Freq: Once | ORAL | 0 refills | Status: DC

## 2022-07-06 NOTE — Patient Instructions (Addendum)
Medication Instructions:  MAKE SURE YOU TAKE YOUR METOPROLOL DAILY  DECREASE HYDROCHLOROTHIAZIDE (HCTZ) 12.5MG  DAILY FOR 1 WEEK  TAKE THE Z-PACK AS DIRECTED UNTIL GONE *If you need a refill on your cardiac medications before your next appointment, please call your pharmacy*  Lab Work: NONE If you have labs (blood work) drawn today and your tests are completely normal, you will receive your results only by: MyChart Message (if you have MyChart) OR A paper copy in the mail If you have any lab test that is abnormal or we need to change your treatment, we will call you to review the results.  Testing/Procedures: SLEEP STUDY-SOMEONE WILL BE CALLING YOU TO SCHEDULE  IF Z-PAC DOE NOT RELIEVER FOUR COUGH/FLU CALL YOUR PRIMARY MD  Follow-Up: At [facility], you and your health needs are our priority.  As part of our continuing mission to provide you with exceptional heart care, we have created designated Provider Care Teams.  These Care Teams include your primary Cardiologist (physician) and Advanced Practice Providers (APPs -  Physician Assistants and Nurse Practitioners) who all work together to provide you with the care you need, when you need it.  We recommend signing up for the patient portal called "MyChart".  Sign up information is provided on this After Visit Summary.  MyChart is used to connect with patients for Virtual Visits (Telemedicine).  Patients are able to view lab/test results, encounter notes, upcoming appointments, etc.  Non-urgent messages can be sent to your provider as well.   To learn more about what you can do with MyChart, go to [email or web address].    Your next appointment:   12 month(s)  Provider:   Glenetta Hew, MD     Other Instructions MAKE SURE TO STAY HYDRATED AT LEAST 80+ OUNCES DAILY

## 2022-07-07 ENCOUNTER — Telehealth: Payer: Self-pay | Admitting: Cardiology

## 2022-07-07 DIAGNOSIS — M25519 Pain in unspecified shoulder: Secondary | ICD-10-CM | POA: Diagnosis not present

## 2022-07-07 NOTE — Telephone Encounter (Signed)
Pt c/o medication issue:  1. Name of Medication:   azithromycin (ZITHROMAX Z-PAK) 250 MG tablet    2. How are you currently taking this medication (dosage and times per day)? Take 2 tab on day one the 1 tab daily for 4 days   3. Are you having a reaction (difficulty breathing--STAT)? no  4. What is your medication issue? Calling to get clarification on this medication for he patient. Please advise

## 2022-07-07 NOTE — Telephone Encounter (Signed)
Called patient's pharmacy. They needed clarification on azithromycin. Initially this was called in as 1 gram tablet. Advised that updated/current Rx is z-pak 250mg  tabs - take 2 on day 1 and then 1 tab daily x4 days

## 2022-07-10 DIAGNOSIS — M25519 Pain in unspecified shoulder: Secondary | ICD-10-CM | POA: Diagnosis not present

## 2022-07-11 DIAGNOSIS — M25519 Pain in unspecified shoulder: Secondary | ICD-10-CM | POA: Diagnosis not present

## 2022-07-12 ENCOUNTER — Encounter (INDEPENDENT_AMBULATORY_CARE_PROVIDER_SITE_OTHER): Payer: Self-pay | Admitting: Family Medicine

## 2022-07-12 ENCOUNTER — Ambulatory Visit (INDEPENDENT_AMBULATORY_CARE_PROVIDER_SITE_OTHER): Payer: Medicaid Other | Admitting: Family Medicine

## 2022-07-12 VITALS — BP 136/81 | HR 100 | Temp 98.4°F | Ht 73.0 in | Wt 354.8 lb

## 2022-07-12 DIAGNOSIS — E669 Obesity, unspecified: Secondary | ICD-10-CM

## 2022-07-12 DIAGNOSIS — E1169 Type 2 diabetes mellitus with other specified complication: Secondary | ICD-10-CM | POA: Diagnosis not present

## 2022-07-12 DIAGNOSIS — K5909 Other constipation: Secondary | ICD-10-CM | POA: Diagnosis not present

## 2022-07-12 DIAGNOSIS — R000 Tachycardia, unspecified: Secondary | ICD-10-CM | POA: Diagnosis not present

## 2022-07-12 DIAGNOSIS — E559 Vitamin D deficiency, unspecified: Secondary | ICD-10-CM

## 2022-07-12 DIAGNOSIS — M25519 Pain in unspecified shoulder: Secondary | ICD-10-CM | POA: Diagnosis not present

## 2022-07-12 DIAGNOSIS — Z9884 Bariatric surgery status: Secondary | ICD-10-CM

## 2022-07-12 DIAGNOSIS — Z7985 Long-term (current) use of injectable non-insulin antidiabetic drugs: Secondary | ICD-10-CM

## 2022-07-12 DIAGNOSIS — Z7984 Long term (current) use of oral hypoglycemic drugs: Secondary | ICD-10-CM | POA: Diagnosis not present

## 2022-07-12 DIAGNOSIS — Z6841 Body Mass Index (BMI) 40.0 and over, adult: Secondary | ICD-10-CM

## 2022-07-12 MED ORDER — VITAMIN D (ERGOCALCIFEROL) 1.25 MG (50000 UNIT) PO CAPS: ORAL_CAPSULE | ORAL | 0 refills | Status: DC

## 2022-07-12 MED ORDER — METFORMIN HCL 1000 MG PO TABS: 1000.0000 mg | ORAL_TABLET | Freq: Two times a day (BID) | ORAL | 0 refills | Status: DC

## 2022-07-12 MED ORDER — PNV PRENATAL PLUS MULTIVIT+DHA 27-1 & 312 MG PO MISC: ORAL | 0 refills | Status: DC

## 2022-07-12 MED ORDER — LINACLOTIDE 72 MCG PO CAPS: 72.0000 ug | ORAL_CAPSULE | Freq: Every day | ORAL | 0 refills | Status: DC

## 2022-07-12 MED ORDER — SEMAGLUTIDE (1 MG/DOSE) 4 MG/3ML ~~LOC~~ SOPN: 1.0000 mg | PEN_INJECTOR | SUBCUTANEOUS | 0 refills | Status: DC

## 2022-07-13 DIAGNOSIS — M25519 Pain in unspecified shoulder: Secondary | ICD-10-CM | POA: Diagnosis not present

## 2022-07-14 DIAGNOSIS — M25519 Pain in unspecified shoulder: Secondary | ICD-10-CM | POA: Diagnosis not present

## 2022-07-17 DIAGNOSIS — M25519 Pain in unspecified shoulder: Secondary | ICD-10-CM | POA: Diagnosis not present

## 2022-07-18 DIAGNOSIS — M25519 Pain in unspecified shoulder: Secondary | ICD-10-CM | POA: Diagnosis not present

## 2022-07-19 DIAGNOSIS — M25519 Pain in unspecified shoulder: Secondary | ICD-10-CM | POA: Diagnosis not present

## 2022-07-20 DIAGNOSIS — M25519 Pain in unspecified shoulder: Secondary | ICD-10-CM | POA: Diagnosis not present

## 2022-07-21 DIAGNOSIS — M25519 Pain in unspecified shoulder: Secondary | ICD-10-CM | POA: Diagnosis not present

## 2022-07-24 DIAGNOSIS — M25519 Pain in unspecified shoulder: Secondary | ICD-10-CM | POA: Diagnosis not present

## 2022-07-25 DIAGNOSIS — M25519 Pain in unspecified shoulder: Secondary | ICD-10-CM | POA: Diagnosis not present

## 2022-07-26 DIAGNOSIS — M25519 Pain in unspecified shoulder: Secondary | ICD-10-CM | POA: Diagnosis not present

## 2022-07-27 DIAGNOSIS — M25519 Pain in unspecified shoulder: Secondary | ICD-10-CM | POA: Diagnosis not present

## 2022-07-28 ENCOUNTER — Telehealth: Payer: Medicaid Other | Admitting: Family Medicine

## 2022-07-28 DIAGNOSIS — R051 Acute cough: Secondary | ICD-10-CM

## 2022-07-28 DIAGNOSIS — M25519 Pain in unspecified shoulder: Secondary | ICD-10-CM | POA: Diagnosis not present

## 2022-07-28 MED ORDER — BENZONATATE 100 MG PO CAPS: 100.0000 mg | ORAL_CAPSULE | Freq: Three times a day (TID) | ORAL | 0 refills | Status: DC

## 2022-07-28 NOTE — Progress Notes (Signed)
Virtual Visit Consent   [name], you are scheduled for a virtual visit with a [facility] provider today. Just as with appointments in the office, your consent must be obtained to participate. Your consent will be active for this visit and any virtual visit you may have with one of our providers in the next 365 days. If you have a MyChart account, a copy of this consent can be sent to you electronically.  As this is a virtual visit, video technology does not allow for your provider to perform a traditional examination. This may limit your provider's ability to fully assess your condition. If your provider identifies any concerns that need to be evaluated in person or the need to arrange testing (such as labs, EKG, etc.), we will make arrangements to do so. Although advances in technology are sophisticated, we cannot ensure that it will always work on either your end or our end. If the connection with a video visit is poor, the visit may have to be switched to a telephone visit. With either a video or telephone visit, we are not always able to ensure that we have a secure connection.  By engaging in this virtual visit, you consent to the provision of healthcare and authorize for your insurance to be billed (if applicable) for the services provided during this visit. Depending on your insurance coverage, you may receive a charge related to this service.  I need to obtain your verbal consent now. Are you willing to proceed with your visit today? [name] has provided verbal consent on 07/28/2022 for a virtual visit (video or telephone). [name], FNP  Date: 07/28/2022 2:44 PM  Virtual Visit via Video Note   I, [name], connected with  Karen Brennan  ([id number], [birth year]) on 07/28/22 at  2:45 PM EST by a video-enabled telemedicine application and verified that I am speaking with the correct person using two identifiers.  Location: Patient: Virtual Visit Location Patient:  Home Provider: Virtual Visit Location Provider: Home Office   I discussed the limitations of evaluation and management by telemedicine and the availability of in person appointments. The patient expressed understanding and agreed to proceed.    History of Present Illness: Karen Brennan is a 47 y.o. who identifies as a female who was assigned female at birth, and is being seen today for a cough since she had the flu. No fever, wheezing or sob. Requests refill on tessalon perles. Marland Kitchen  HPI: HPI  Problems:  Patient Active Problem List   Diagnosis Date Noted   Morbid obesity (HCC)-starting bmi 58.45 07/12/2022   BMI 45.0-49.9, adult (HCC)-current bmi 46.8 07/12/2022   Chronic constipation 06/14/2022   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   Class 3 severe obesity with serious comorbidity and body mass index (BMI) of 50.0 to 59.9 in adult [facility]) 05/01/2022   Seasonal allergies 05/01/2022   Type 2 diabetes mellitus with obesity ([place]) 04/10/2022   Absolute anemia 03/13/2022   Hyperlipidemia associated with type 2 diabetes mellitus ([place]) 12/05/2021   B12 deficiency 08/29/2021   Type II diabetes mellitus ([place]) 08/29/2021   Vitamin D deficiency 08/29/2021   Constipation 08/29/2021   PVC's (premature ventricular contractions) 07/19/2021   Intertrigo 02/16/2020   Tachycardia 123XX123   Systolic murmur 123XX123   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   Neuropathic pain of both feet 05/09/2016   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   Anxiety 10/25/2011   Bipolar 1 disorder, mixed ([place]) 09/15/2011  Allergic rhinitis 08/31/2008   Microcytic anemia 08/14/2008   Esophageal reflux 01/24/2008   Hypertension associated with diabetes ([place]) 08/17/2006   Morbid obesity with BMI of 50.0-59.9, adult ([place]) 08/09/2006    Allergies:  Allergies  Allergen Reactions   Sibutramine Hcl Monohydrate Hives   Medications:  Current Outpatient Medications:    Accu-Chek Softclix  Lancets lancets, USE TO test fasting blood sugar EVERY DAY IN THE MORNING, Disp: 100 each, Rfl: 6   acetaminophen (TYLENOL) 500 MG tablet, Take 1,000 mg by mouth every 6 (six) hours as needed for mild pain., Disp: , Rfl:    albuterol (VENTOLIN HFA) 108 (90 Base) MCG/ACT inhaler, INHALE 2 PUFFS BY MOUTH EVERY 4 HOURS AS NEEDED wheezing OR SHORTNESS OF BREATH, Disp: 18 g, Rfl: 1   atorvastatin (LIPITOR) 20 MG tablet, Take 1 tablet (20 mg total) by mouth daily., Disp: 90 tablet, Rfl: 3   azithromycin (ZITHROMAX Z-PAK) 250 MG tablet, Take 2 tab on day one the 1 tab daily for 4 days, Disp: 6 each, Rfl: 0   benzonatate (TESSALON) 100 MG capsule, Take 1 capsule (100 mg total) by mouth every 8 (eight) hours., Disp: 21 capsule, Rfl: 0   benztropine (COGENTIN) 1 MG tablet, Take 1 mg by mouth 2 (two) times daily., Disp: , Rfl:    blood glucose meter kit and supplies KIT, Dispense based on patient and insurance preference. Use up to four times daily as directed. (FOR ICD-9 250.00, 250.01)., Disp: 1 each, Rfl: 0   Blood Glucose Monitoring Suppl (ACCU-CHEK GUIDE) w/Device KIT, Check blood glucose in AM while fasting, Disp: 1 kit, Rfl: 0   busPIRone (BUSPAR) 7.5 MG tablet, Take 7.5 mg by mouth 2 (two) times daily., Disp: , Rfl:    clotrimazole (LOTRIMIN) 1 % cream, APPLY TO AFFECTED AREA TWICE A DAY, Disp: 30 g, Rfl: 0   ferrous sulfate 325 (65 FE) MG tablet, Take 325 mg by mouth in the morning and at bedtime., Disp: , Rfl:    FLUoxetine (PROZAC) 40 MG capsule, Take 40 mg by mouth daily., Disp: , Rfl:    fluticasone (FLONASE) 50 MCG/ACT nasal spray, place 2 SPRAYS IN EACH NOSTRIL EVERY DAY AS NEEDED allergies OR RHINITIS, Disp: 16 g, Rfl: 0   gabapentin (NEURONTIN) 300 MG capsule, Take 1 capsule (300 mg total) by mouth 3 (three) times daily., Disp: 90 capsule, Rfl: 1   glucose blood (ACCU-CHEK GUIDE) test strip, 1 each by Other route as needed for other. Use as instructed, Disp: 50 strip, Rfl: 6   GOODSENSE ALLERGY  RELIEF 10 MG tablet, TAKE 1 Tablet BY MOUTH ONCE DAILY, Disp: 90 tablet, Rfl: 0   guaiFENesin (MUCINEX) 600 MG 12 hr tablet, Take by mouth 2 (two) times daily., Disp: , Rfl:    haloperidol (HALDOL) 2 MG tablet, Take 2 mg by mouth daily., Disp: , Rfl:    hydrochlorothiazide (HYDRODIURIL) 25 MG tablet, TAKE 1 Tablet BY MOUTH ONCE DAILY, Disp: 90 tablet, Rfl: 0   hydrOXYzine (VISTARIL) 25 MG capsule, Take 25 mg by mouth every evening. Every evening as needed, Disp: , Rfl:    Lancet Devices (ACCU-CHEK SOFTCLIX) lancets, Use as instructed, Disp: 1 each, Rfl: 0   linaclotide (LINZESS) 72 MCG capsule, Take 1 capsule (72 mcg total) by mouth daily before breakfast., Disp: 30 capsule, Rfl: 0   metFORMIN (GLUCOPHAGE) 1000 MG tablet, Take 1 tablet (1,000 mg total) by mouth 2 (two) times daily with a meal., Disp: 60 tablet, Rfl: 0  methocarbamol (ROBAXIN) 500 MG tablet, Take 1 tablet (500 mg total) by mouth 2 (two) times daily., Disp: 20 tablet, Rfl: 0   metoprolol tartrate (LOPRESSOR) 25 MG tablet, TAKE ONE-HALF TABLET BY MOUTH TWICE DAILY AS NEEDED, Disp: 30 tablet, Rfl: 1   naproxen (NAPROSYN) 500 MG tablet, Take 1 tablet (500 mg total) by mouth 2 (two) times daily., Disp: 30 tablet, Rfl: 0   nystatin (MYCOSTATIN/NYSTOP) powder, Apply 1 Application topically 3 (three) times daily., Disp: 15 g, Rfl: 0   OLANZapine (ZYPREXA) 20 MG tablet, Take 20 mg by mouth at bedtime. , Disp: , Rfl:    Prenatal Vit-Fe Fum-FA-Omega (PNV PRENATAL PLUS MULTIVIT+DHA) 27-1 & 312 MG MISC, 1 po qd, Disp: 90 each, Rfl: 0   Probiotic Product (ACIDOPHILUS PROBIOTIC BLEND PO), Take by mouth., Disp: , Rfl:    Semaglutide, 1 MG/DOSE, 4 MG/3ML SOPN, Inject 1 mg as directed once a week., Disp: 3 mL, Rfl: 0   traZODone (DESYREL) 50 MG tablet, Take 50 mg by mouth at bedtime as needed for sleep., Disp: , Rfl:    valsartan (DIOVAN) 80 MG tablet, Take 1 tablet (80 mg total) by mouth at bedtime., Disp: 90 tablet, Rfl: 0   vitamin B-12  (CYANOCOBALAMIN) 1000 MCG tablet, TAKE 1 Tablet BY MOUTH ONCE DAILY, Disp: 30 tablet, Rfl: 1   Vitamin D, Ergocalciferol, (DRISDOL) 1.25 MG (50000 UNIT) CAPS capsule, 1 tab q wed and 1 tab q sun, Disp: 10 capsule, Rfl: 0  Observations/Objective: Patient is well-developed, well-nourished in no acute distress.  Resting comfortably  at home.  Head is normocephalic, atraumatic.  No labored breathing.  Speech is clear and coherent with logical content.  Patient is alert and oriented at baseline.    Assessment and Plan: 1. Acute cough  Increase fluids, humidifier at night, tylenol or ibuprofen as directed, urgent care if sx worsen.   Follow Up Instructions: I discussed the assessment and treatment plan with the patient. The patient was provided an opportunity to ask questions and all were answered. The patient agreed with the plan and demonstrated an understanding of the instructions.  A copy of instructions were sent to the patient via MyChart unless otherwise noted below.     The patient was advised to call back or seek an in-person evaluation if the symptoms worsen or if the condition fails to improve as anticipated.  Time:  I spent 10 minutes with the patient via telehealth technology discussing the above problems/concerns.    [name], FNP

## 2022-07-28 NOTE — Patient Instructions (Signed)

## 2022-07-31 DIAGNOSIS — M25519 Pain in unspecified shoulder: Secondary | ICD-10-CM | POA: Diagnosis not present

## 2022-08-01 DIAGNOSIS — M25519 Pain in unspecified shoulder: Secondary | ICD-10-CM | POA: Diagnosis not present

## 2022-08-01 NOTE — Progress Notes (Unsigned)
Chief Complaint:   OBESITY Karen Brennan is here to discuss her progress with her obesity treatment plan along with follow-up of her obesity related diagnoses. Karen Brennan is on the Category 3 Plan + 110 grams of protein and states she is following her eating plan approximately 80% of the time. Karen Brennan states she is foot bike/weights/walking 30 minutes 3 times per week.  Today's visit was #: 23 Starting weight: 443 LBS Starting date: 02/01/2022 Today's weight: 354 lbs Today's date: 07/12/2022 Total lbs lost to date: 50 LBS Total lbs lost since last in-office visit: 0  Interim History: Patient's mom is with flu symptoms for 2 weeks now.  Unable to eat everything on plan.  Making soup and has not felt well enough to meal prep like usual.  Was in the ER due to flu and had to get fluids due to dehydration.  They also decreased blood pressure medication dose.  Notes reviewed from ER visit and cardiology visit.  Subjective:   1. Tachycardia Seen by Cards on 1/25 for her low blood pressure of 108/20 and HR 111.  Positive for Flu B on the 23rd of Jan in ER.   Noted to be dehydrated.  Cards gave patient Z pack, feeling better some.  2. Type 2 diabetes mellitus with obesity (HCC) A1c equals 5.43-monthago.  No lows or highs but not checking at home.  Serum creatinine =0.97 1 week ago.  3. Chronic constipation With her recent Flu B contraction-had nausea/vomiting and diarrhea.  No constipation; uses Linzess prior.  80 oz/H20 day.  4. Vitamin D deficiency 447T, month ago.   5. S/P gastric bypass B12-721, Mag=1.7, 1.8.  CBC- 1 week ago-within normal limits.  Assessment/Plan:  No orders of the defined types were placed in this encounter.   Medications Discontinued During This Encounter  Medication Reason   Prenatal Vit-Fe Fumarate-FA (M-NATAL PLUS) 27-1 MG TABS    Vitamin D, Ergocalciferol, (DRISDOL) 1.25 MG (50000 UNIT) CAPS capsule Reorder   Prenatal Vit-Fe Fum-FA-Omega (PNV PRENATAL PLUS  MULTIVIT+DHA) 27-1 & 312 MG MISC Reorder   linaclotide (LINZESS) 72 MCG capsule Reorder   metFORMIN (GLUCOPHAGE) 1000 MG tablet Reorder   Semaglutide, 1 MG/DOSE, 4 MG/3ML SOPN Reorder     Meds ordered this encounter  Medications   DISCONTD: linaclotide (LINZESS) 72 MCG capsule    Sig: Take 1 capsule (72 mcg total) by mouth daily before breakfast.    Dispense:  30 capsule    Refill:  0   DISCONTD: Prenatal Vit-Fe Fum-FA-Omega (PNV PRENATAL PLUS MULTIVIT+DHA) 27-1 & 312 MG MISC    Sig: 1 po qd    Dispense:  90 each    Refill:  0   DISCONTD: Vitamin D, Ergocalciferol, (DRISDOL) 1.25 MG (50000 UNIT) CAPS capsule    Sig: 1 tab q wed and 1 tab q sun    Dispense:  10 capsule    Refill:  0    90 d supply;  ** OV for RF **   Do not send RF request   DISCONTD: metFORMIN (GLUCOPHAGE) 1000 MG tablet    Sig: Take 1 tablet (1,000 mg total) by mouth 2 (two) times daily with a meal.    Dispense:  60 tablet    Refill:  0   DISCONTD: Semaglutide, 1 MG/DOSE, 4 MG/3ML SOPN    Sig: Inject 1 mg as directed once a week.    Dispense:  3 mL    Refill:  0     1.  Tachycardia Make sure she drunks plenty H20. 80+ oz daily.  Patient will increase her blood pressure medications per prior dose and today due to checked blood pressure 136/91.  Increased HCTZ to Qb, not 1/2.  2. Type 2 diabetes mellitus with obesity (HCC) We will refill Metformin 1000 mg twice a day for 1 month AND refill Ozempic 1 mg SQ once a week for 1 month with 0 refills.  -Refill metFORMIN (GLUCOPHAGE) 1000 MG tablet; Take 1 tablet (1,000 mg total) by mouth 2 (two) times daily with a meal.  Dispense: 60 tablet; Refill: 0  -Refill Semaglutide, 1 MG/DOSE, 4 MG/3ML SOPN; Inject 1 mg as directed once a week.  Dispense: 3 mL; Refill: 0  3. Chronic constipation We will refill Linzess 72 mcg once a day for 1 month with 0 refills.  -Refill linaclotide (LINZESS) 72 MCG capsule; Take 1 capsule (72 mcg total) by mouth daily before breakfast.   Dispense: 30 capsule; Refill: 0  4. Vitamin D deficiency We will refill Vit D 50K IU once a week for 1 month with 0 refills.  -Refill Vitamin D, Ergocalciferol, (DRISDOL) 1.25 MG (50000 UNIT) CAPS capsule; 1 tab q wed and 1 tab q sun  Dispense: 10 capsule; Refill: 0  5. S/P gastric bypass We will refill PNV every day for 1 month with 0 refills.  Continue B12=1000 daily.  -Refill Prenatal Vit-Fe Fum-FA-Omega (PNV PRENATAL PLUS MULTIVIT+DHA) 27-1 & 312 MG MISC; 1 po qd  Dispense: 90 each; Refill: 0  6. Morbid obesity (HCC)-starting bmi 58.45  7. BMI 45.0-49.9, adult (HCC)-current bmi 46.8 Karen Brennan is currently in the action stage of change. As such, her goal is to continue with weight loss efforts. She has agreed to the Category 3 Plan with breakfast and lunch options.  Exercise goals: As is.  Behavioral modification strategies: increasing lean protein intake, keeping healthy foods in the home, and avoiding temptations.  Karen Brennan has agreed to follow-up with our clinic in 4 weeks. She was informed of the importance of frequent follow-up visits to maximize her success with intensive lifestyle modifications for her multiple health conditions.   Objective:   Blood pressure 136/81, pulse 100, temperature 98.4 F (36.9 C), height 6' 1"$  (1.854 m), last menstrual period 05/22/2022, SpO2 97 %. Body mass index is 34.12 kg/m.  General: Cooperative, alert, well developed, in no acute distress. HEENT: Conjunctivae and lids unremarkable. Cardiovascular: Regular rhythm.  Lungs: Normal work of breathing. Neurologic: No focal deficits.   Lab Results  Component Value Date   CREATININE 0.97 07/04/2022   BUN 10 07/04/2022   NA 137 07/04/2022   K 3.4 (L) 07/04/2022   CL 101 07/04/2022   CO2 21 (L) 07/04/2022   Lab Results  Component Value Date   ALT 20 12/31/2021   AST 23 12/31/2021   ALKPHOS 50 12/31/2021   BILITOT 0.5 12/31/2021   Lab Results  Component Value Date   HGBA1C 5.7 (H)  06/01/2022   HGBA1C 6.0 (H) 01/12/2022   HGBA1C 6.0 (H) 10/05/2021   HGBA1C 5.8 (H) 06/20/2021   HGBA1C 6.7 (H) 02/01/2021   Lab Results  Component Value Date   INSULIN 11.0 01/12/2022   INSULIN 22.7 02/01/2021   Lab Results  Component Value Date   TSH 1.090 09/06/2021   Lab Results  Component Value Date   CHOL 189 12/05/2021   HDL 61 12/05/2021   LDLCALC 107 (H) 12/05/2021   TRIG 119 12/05/2021   CHOLHDL 3.1 12/05/2021   Lab Results  Component Value Date   VD25OH 46.1 01/12/2022   VD25OH 37.2 10/05/2021   VD25OH 22.9 (L) 06/20/2021   Lab Results  Component Value Date   WBC 10.4 07/04/2022   HGB 14.1 07/04/2022   HCT 43.4 07/04/2022   MCV 91.2 07/04/2022   PLT 354 07/04/2022   Lab Results  Component Value Date   IRON 30 06/20/2021   TIBC 444 06/20/2021   FERRITIN 8 (L) 06/20/2021   Attestation Statements:   Reviewed by clinician on day of visit: allergies, medications, problem list, medical history, surgical history, family history, social history, and previous encounter notes.  Time spent on visit including pre-visit chart review and post-visit care and charting was 40 minutes.   I, Brendell Tyus, am acting as Location manager for [employer], DO.   I have reviewed the above documentation for accuracy and completeness, and I agree with the above. Marjory Sneddon, D.O.  The [place] was signed into law in 2016 which includes the topic of electronic health records.  This provides immediate access to information in MyChart.  This includes consultation notes, operative notes, office notes, lab results and pathology reports.  If you have any questions about what you read please let us know at your next visit so we can discuss your concerns and take corrective action if need be.  We are right here with you.

## 2022-08-02 ENCOUNTER — Encounter (INDEPENDENT_AMBULATORY_CARE_PROVIDER_SITE_OTHER): Payer: Self-pay | Admitting: Family Medicine

## 2022-08-02 ENCOUNTER — Ambulatory Visit (INDEPENDENT_AMBULATORY_CARE_PROVIDER_SITE_OTHER): Payer: Medicaid Other | Admitting: Family Medicine

## 2022-08-02 VITALS — BP 135/89 | HR 95 | Temp 99.5°F | Ht 73.0 in | Wt 350.4 lb

## 2022-08-02 DIAGNOSIS — E559 Vitamin D deficiency, unspecified: Secondary | ICD-10-CM | POA: Diagnosis not present

## 2022-08-02 DIAGNOSIS — Z9884 Bariatric surgery status: Secondary | ICD-10-CM

## 2022-08-02 DIAGNOSIS — E1169 Type 2 diabetes mellitus with other specified complication: Secondary | ICD-10-CM

## 2022-08-02 DIAGNOSIS — Z7984 Long term (current) use of oral hypoglycemic drugs: Secondary | ICD-10-CM | POA: Diagnosis not present

## 2022-08-02 DIAGNOSIS — Z7985 Long-term (current) use of injectable non-insulin antidiabetic drugs: Secondary | ICD-10-CM | POA: Diagnosis not present

## 2022-08-02 DIAGNOSIS — E669 Obesity, unspecified: Secondary | ICD-10-CM

## 2022-08-02 DIAGNOSIS — Z6841 Body Mass Index (BMI) 40.0 and over, adult: Secondary | ICD-10-CM | POA: Diagnosis not present

## 2022-08-02 DIAGNOSIS — M25519 Pain in unspecified shoulder: Secondary | ICD-10-CM | POA: Diagnosis not present

## 2022-08-02 DIAGNOSIS — K5909 Other constipation: Secondary | ICD-10-CM | POA: Diagnosis not present

## 2022-08-02 DIAGNOSIS — D508 Other iron deficiency anemias: Secondary | ICD-10-CM

## 2022-08-02 MED ORDER — SEMAGLUTIDE (1 MG/DOSE) 4 MG/3ML ~~LOC~~ SOPN: 1.0000 mg | PEN_INJECTOR | SUBCUTANEOUS | 0 refills | Status: DC

## 2022-08-02 MED ORDER — METFORMIN HCL 1000 MG PO TABS: 1000.0000 mg | ORAL_TABLET | Freq: Two times a day (BID) | ORAL | 0 refills | Status: DC

## 2022-08-02 MED ORDER — VITAMIN D (ERGOCALCIFEROL) 1.25 MG (50000 UNIT) PO CAPS: ORAL_CAPSULE | ORAL | 0 refills | Status: DC

## 2022-08-02 MED ORDER — LINACLOTIDE 72 MCG PO CAPS: 72.0000 ug | ORAL_CAPSULE | Freq: Every day | ORAL | 0 refills | Status: DC

## 2022-08-02 MED ORDER — PNV PRENATAL PLUS MULTIVIT+DHA 27-1 & 312 MG PO MISC: ORAL | 0 refills | Status: DC

## 2022-08-03 DIAGNOSIS — F250 Schizoaffective disorder, bipolar type: Secondary | ICD-10-CM | POA: Diagnosis not present

## 2022-08-03 DIAGNOSIS — M25519 Pain in unspecified shoulder: Secondary | ICD-10-CM | POA: Diagnosis not present

## 2022-08-04 DIAGNOSIS — M25519 Pain in unspecified shoulder: Secondary | ICD-10-CM | POA: Diagnosis not present

## 2022-08-06 ENCOUNTER — Ambulatory Visit (HOSPITAL_BASED_OUTPATIENT_CLINIC_OR_DEPARTMENT_OTHER): Payer: Medicaid Other | Attending: General Practice | Admitting: Cardiovascular Disease

## 2022-08-06 VITALS — Ht 73.0 in | Wt 345.0 lb

## 2022-08-06 DIAGNOSIS — E119 Type 2 diabetes mellitus without complications: Secondary | ICD-10-CM | POA: Diagnosis not present

## 2022-08-06 DIAGNOSIS — G4736 Sleep related hypoventilation in conditions classified elsewhere: Secondary | ICD-10-CM | POA: Insufficient documentation

## 2022-08-06 DIAGNOSIS — E669 Obesity, unspecified: Secondary | ICD-10-CM | POA: Insufficient documentation

## 2022-08-06 DIAGNOSIS — R0683 Snoring: Secondary | ICD-10-CM | POA: Diagnosis not present

## 2022-08-06 DIAGNOSIS — G4730 Sleep apnea, unspecified: Secondary | ICD-10-CM

## 2022-08-06 DIAGNOSIS — R5383 Other fatigue: Secondary | ICD-10-CM | POA: Insufficient documentation

## 2022-08-06 DIAGNOSIS — G4733 Obstructive sleep apnea (adult) (pediatric): Secondary | ICD-10-CM

## 2022-08-06 DIAGNOSIS — I10 Essential (primary) hypertension: Secondary | ICD-10-CM | POA: Insufficient documentation

## 2022-08-07 DIAGNOSIS — M25519 Pain in unspecified shoulder: Secondary | ICD-10-CM | POA: Diagnosis not present

## 2022-08-08 DIAGNOSIS — M25519 Pain in unspecified shoulder: Secondary | ICD-10-CM | POA: Diagnosis not present

## 2022-08-09 DIAGNOSIS — M25519 Pain in unspecified shoulder: Secondary | ICD-10-CM | POA: Diagnosis not present

## 2022-08-09 DIAGNOSIS — F250 Schizoaffective disorder, bipolar type: Secondary | ICD-10-CM | POA: Diagnosis not present

## 2022-08-10 DIAGNOSIS — M25519 Pain in unspecified shoulder: Secondary | ICD-10-CM | POA: Diagnosis not present

## 2022-08-11 ENCOUNTER — Encounter (HOSPITAL_BASED_OUTPATIENT_CLINIC_OR_DEPARTMENT_OTHER): Payer: Self-pay | Admitting: Cardiovascular Disease

## 2022-08-11 DIAGNOSIS — M25519 Pain in unspecified shoulder: Secondary | ICD-10-CM | POA: Diagnosis not present

## 2022-08-11 NOTE — Procedures (Signed)
Patient Name: Karen Brennan, Karen Brennan Date: 08/06/2022 Gender: Female D.O.B: 1976-02-08 Age (years): 59 Referring Provider: Coletta Memos NP Height (inches): 22 Interpreting Physician: Shelva Majestic MD, ABSM Weight (lbs): 345 RPSGT: Jacolyn Reedy BMI: 46 MRN: [id number] Neck Size: 16.00  CLINICAL INFORMATION Sleep Study Type: NPSG  Indication for sleep study: Diabetes, Fatigue, Hypertension, Obesity  Epworth Sleepiness Score: 6  Most recent polysomnogram dated 07/19/2021 revealed an AHI of 9.2/h and RDI of 21.8/h.  SLEEP STUDY TECHNIQUE As per the AASM Manual for the Scoring of Sleep and Associated Events v2.3 (April 2016) with a hypopnea requiring 4% desaturations.  The channels recorded and monitored were frontal, central and occipital EEG, electrooculogram (EOG), submentalis EMG (chin), nasal and oral airflow, thoracic and abdominal wall motion, anterior tibialis EMG, snore microphone, electrocardiogram, and pulse oximetry.  MEDICATIONS acetaminophen (TYLENOL) 500 MG tablet albuterol (VENTOLIN HFA) 108 (90 Base) MCG/ACT inhaler atorvastatin (LIPITOR) 20 MG tablet azithromycin (ZITHROMAX Z-PAK) 250 MG tablet benzonatate (TESSALON) 100 MG capsule benztropine (COGENTIN) 1 MG tablet blood glucose meter kit and supplies KIT Blood Glucose Monitoring Suppl (ACCU-CHEK GUIDE) w/Device KIT busPIRone (BUSPAR) 7.5 MG tablet clotrimazole (LOTRIMIN) 1 % cream ferrous sulfate 325 (65 FE) MG tablet FLUoxetine (PROZAC) 40 MG capsule fluticasone (FLONASE) 50 MCG/ACT nasal spray gabapentin (NEURONTIN) 300 MG capsule glucose blood (ACCU-CHEK GUIDE) test strip GOODSENSE ALLERGY RELIEF 10 MG tablet guaiFENesin (MUCINEX) 600 MG 12 hr tablet haloperidol (HALDOL) 2 MG tablet hydrochlorothiazide (HYDRODIURIL) 25 MG tablet hydrOXYzine (VISTARIL) 25 MG capsule Lancet Devices (ACCU-CHEK SOFTCLIX) lancets linaclotide (LINZESS) 72 MCG capsule metFORMIN (GLUCOPHAGE) 1000 MG  tablet methocarbamol (ROBAXIN) 500 MG tablet metoprolol tartrate (LOPRESSOR) 25 MG tablet naproxen (NAPROSYN) 500 MG tablet nystatin (MYCOSTATIN/NYSTOP) powder OLANZapine (ZYPREXA) 20 MG tablet Prenatal Vit-Fe Fum-FA-Omega (PNV PRENATAL PLUS MULTIVIT+DHA) 27-1 & 312 MG MISC Probiotic Product (ACIDOPHILUS PROBIOTIC BLEND PO) Semaglutide, 1 MG/DOSE, 4 MG/3ML SOPN traZODone (DESYREL) 50 MG tablet valsartan (DIOVAN) 80 MG tablet vitamin B-12 (CYANOCOBALAMIN) 1000 MCG tablet Vitamin D, Ergocalciferol, (DRISDOL) 1.25 MG (50000 UNIT) CAPS capsule Medications self-administered by patient taken the night of the study : BENZTROPINE MESYLATE, busiprone, FLUOXETINE, GABAPENTIN, HALOPERIDOL, HYDROXYZINE, METFORMIN, OLANZAPINE, TRAZODONE, VALSARTAN  SLEEP ARCHITECTURE The study was initiated at 8:46:13 PM and ended at 3:43:43 AM.  Sleep onset time was 3.9 minutes and the sleep efficiency was 98.3%. The total sleep time was 410.6 minutes.  Stage REM latency was 123.5 minutes.  The patient spent 3.3% of the night in stage N1 sleep, 88.0% in stage N2 sleep, 0.0% in stage N3 and 8.7% in REM.  Alpha intrusion was absent.  Supine sleep was 0.00%.  RESPIRATORY PARAMETERS The overall apnea/hypopnea index (AHI) was 0.6 per hour. The respiratory disturbance index (RDI) was 0.6/h. There were 2 total apneas, including 0 obstructive, 2 central and 0 mixed apneas. There were 2 hypopneas and 0 RERAs.  The AHI during Stage REM sleep was 6.8 per hour.  AHI while supine was N/A per hour.  The mean oxygen saturation was 94.5%. The minimum SpO2 during sleep was 80.0%.  Loud snoring was noted during this study.  CARDIAC DATA The 2 lead EKG demonstrated sinus rhythm. The mean heart rate was 82.9 beats per minute. Other EKG findings include: None.  LEG MOVEMENT DATA The total PLMS were 0 with a resulting PLMS index of 0.0. Associated arousal with leg movement index was 0.0 .  IMPRESSIONS - No significant  obstructive sleep apnea overall (AHI  0.6/h); however, mild sleep apnea was present during REM sleep.  With absent supine sleep on this study the overall AHI may be underestimated if supine sleep was present.  - No significant central sleep apnea occurred during this study (CAI 0.3/h). - Moderate oxygen desaturation to a nadir of 80%. - The patient snored with loud snoring volume. - No cardiac abnormalities were noted during this study. - Clinically significant periodic limb movements did not occur during sleep. No significant associated arousals.  DIAGNOSIS - Nocturnal Hypoxemia (G47.36) - Loud snoring  RECOMMENDATIONS - At present, there is no indication for CPAP.  - Effort should be made to optimize nasal and oropharyngeal patency. - Avoid alcohol, sedatives and other CNS depressants that may worsen sleep apnea and disrupt normal sleep architecture. - Sleep hygiene should be reviewed to assess factors that may improve sleep quality. - Weight management (BMI 46) and regular exercise should be initiated or continued if appropriate.  [Electronically signed] 08/11/2022 02:57 PM  Shelva Majestic MD, [facility], ABSM Diplomate, American Board of Sleep Medicine  NPI: [id number]  [place] PH: [phone]   FX: [phone] [place]

## 2022-08-14 ENCOUNTER — Other Ambulatory Visit: Payer: Self-pay

## 2022-08-14 DIAGNOSIS — G5793 Unspecified mononeuropathy of bilateral lower limbs: Secondary | ICD-10-CM

## 2022-08-14 DIAGNOSIS — M25519 Pain in unspecified shoulder: Secondary | ICD-10-CM | POA: Diagnosis not present

## 2022-08-14 MED ORDER — VALSARTAN 80 MG PO TABS: 80.0000 mg | ORAL_TABLET | Freq: Every day | ORAL | 0 refills | Status: DC

## 2022-08-14 MED ORDER — GABAPENTIN 300 MG PO CAPS: 300.0000 mg | ORAL_CAPSULE | Freq: Three times a day (TID) | ORAL | 1 refills | Status: DC

## 2022-08-15 DIAGNOSIS — M25519 Pain in unspecified shoulder: Secondary | ICD-10-CM | POA: Diagnosis not present

## 2022-08-15 NOTE — Progress Notes (Signed)
Marjory Sneddon, D.O.  ABFM, ABOM Specializing in Clinical Bariatric Medicine  Office located at: [address] , [place]  [address]     Chief Complaint:   OBESITY Karen Brennan is here to discuss her progress with her obesity treatment plan along with follow-up of her obesity related diagnoses. Karen Brennan is on the Category 3 Plan with breakfast and lunch options and states she is following her eating plan approximately 80% of the time. Karen Brennan states she is walking, foot bike, weight 30 minutes 3 times per week.  Today's visit was #: 24 Starting weight: 443 LBS Starting date: 02/01/2022 Today's weight: 350 LBS Today's date: 08/02/2022 Total lbs lost to date: 93 LBS Total lbs lost since last in-office visit: 4 LBS  Interim History: Karen Brennan is here for a follow up office visit.  We reviewed her meal plan and all questions were answered.  Patient's food recall appears to be accurate and consistent with what is on plan when she is following it.   When eating on plan, her hunger and cravings are well controlled.   Biometric, loss been multiple and same and fat mass as prior.   - Patient has had the flu and is very tired.  Not drinking as much and eating out more lately as she doesn't have energy to cook    Subjective:   1. Type 2 diabetes mellitus with obesity ([place]) Patient check blood sugar one time it was 120.  No lows or highs.  No concerns.  Last A1c about 2 months ago was 5.7. Lab Results  Component Value Date   HGBA1C 5.7 (H) 06/01/2022   HGBA1C 6.0 (H) 01/12/2022   HGBA1C 6.0 (H) 10/05/2021   INSULIN 11.0 01/12/2022   INSULIN 22.7 02/01/2021     2. Other constipation Worsening constipation after having the flu.  Was not taking Linzess daily and now needs it.  No MiraLAX. Lab Results  Component Value Date   CREATININE 0.97 07/04/2022   CREATININE 0.85 12/31/2021   CREATININE 0.90 10/05/2021   BUN 10 07/04/2022   BUN 7 12/31/2021   BUN 10 10/05/2021   NA 137  07/04/2022   NA 137 12/31/2021   NA 142 10/05/2021   K 3.4 (L) 07/04/2022   K 3.9 12/31/2021   K 4.6 10/05/2021   CL 101 07/04/2022   CL 101 12/31/2021   CL 101 10/05/2021   CO2 21 (L) 07/04/2022   CO2 27 12/31/2021   CO2 24 10/05/2021     3. Vitamin D deficiency Last vitamin D level one month ago.  No side effects compliance is good now and patient is taking it much more regularly. Lab Results  Component Value Date   VD25OH 46.1 01/12/2022   VD25OH 37.2 10/05/2021   VD25OH 22.9 (L) 06/20/2021     4. Other iron deficiency anemia Patient is taking twice daily FeSO4 now.  She has been very regular with use. Lab Results  Component Value Date   HGB 14.1 07/04/2022   HCT 43.4 07/04/2022   HCT 40.5 12/31/2021   HCT 36.4 09/06/2021      Assessment/Plan:   Medications Discontinued During This Encounter  Medication Reason   linaclotide (LINZESS) 72 MCG capsule Reorder   Prenatal Vit-Fe Fum-FA-Omega (PNV PRENATAL PLUS MULTIVIT+DHA) 27-1 & 312 MG MISC Reorder   Vitamin D, Ergocalciferol, (DRISDOL) 1.25 MG (50000 UNIT) CAPS capsule Reorder   metFORMIN (GLUCOPHAGE) 1000 MG tablet Reorder   Semaglutide, 1 MG/DOSE, 4 MG/3ML SOPN  Reorder     Meds ordered this encounter  Medications   metFORMIN (GLUCOPHAGE) 1000 MG tablet    Sig: Take 1 tablet (1,000 mg total) by mouth 2 (two) times daily with a meal.    Dispense:  60 tablet    Refill:  0   Prenatal Vit-Fe Fum-FA-Omega (PNV PRENATAL PLUS MULTIVIT+DHA) 27-1 & 312 MG MISC    Sig: 1 po qd    Dispense:  90 each    Refill:  0   Semaglutide, 1 MG/DOSE, 4 MG/3ML SOPN    Sig: Inject 1 mg as directed once a week.    Dispense:  3 mL    Refill:  0   Vitamin D, Ergocalciferol, (DRISDOL) 1.25 MG (50000 UNIT) CAPS capsule    Sig: 1 tab q wed and 1 tab q sun    Dispense:  8 capsule    Refill:  0    90 d supply;  ** OV for RF **   Do not send RF request   linaclotide (LINZESS) 72 MCG capsule    Sig: Take 1 capsule (72 mcg total) by  mouth daily before breakfast.    Dispense:  30 capsule    Refill:  0     1. Type 2 diabetes mellitus with obesity ([place]) - Counseled patient on pathophysiology of disease and discussed how good blood sugar control is important to decrease the risk of diabetic complications such as nephropathy, neuropathy, limb loss, blindness, coronary artery disease, etc.   - Intensive lifestyle modification including diet, exercise and weight loss are the first line of treatment for diabetes. We extensively discussed the importance of decreasing simple carbs and how certain foods they eat will affect their blood sugars - Reminded Karen Brennan if she feels poorly- check Blood Sugar and Blood Pressure at that time.    - Hypoglycemia prevention discussed with the patient.  Eat on a regular basis- no skipping or going long periods without eating.     - Recommend that any concerns about medicines should be directed at the prescribing provider - Recheck labs in 3 months if not done at Endo provider / PCP.  - Importance of f/up with PCP and all other specialists, as scheduled, was stressed to the patient today  -  No need for change in dose at this time.  Refill- metFORMIN (GLUCOPHAGE) 1000 MG tablet; Take 1 tablet (1,000 mg total) by mouth 2 (two) times daily with a meal.  Dispense: 60 tablet; Refill: 0  Refill- Semaglutide, 1 MG/DOSE, 4 MG/3ML SOPN; Inject 1 mg as directed once a week.  Dispense: 3 mL; Refill: 0   2. Other constipation - informed that a decrease in bowel movement frequency is normal while losing weight, but stools should not be hard or painful.  - Patient advised to begin MiraLAX once daily or every other day titrated for one softer, normal caliber bowel movement each day.    - Discussed addition of laxatives such as Peri-colace and Colace as needed for refractory constipation.   - Adequate daily water intake encouraged along with activity/ movement  - Bowel habits and good bowel hygiene  discussed with patient  - F/up with your primary care provider if W or NI to rule out causes for constipation besides nutrition   Additional Counseling-  Good Bowel Health: Your goal is to have one soft bowel movement each day. Drink at least half of your weight in ounces of water per day unless otherwise noted by one  of your doctors that you must restrict water intake.  Eat plenty of fiber (goal is over 25 grams each day).  It is best to get most of your fiber from dietary sources which includes leafy green vegetables, fresh fruit, and whole grains.  You may need to add fiber with the help of OTC fiber supplements.  These include the likes of Metamucil, Citrucel, Psyllium husks and Flaxseed.  If you are still having trouble, try adding Fleet's enemas or Magnesium Citrate. If all of these changes do not work, contact your PCP.   Refill- linaclotide (LINZESS) 72 MCG capsule; Take 1 capsule (72 mcg total) by mouth daily before breakfast.  Dispense: 30 capsule; Refill: 0   3. Vitamin D deficiency Low Vitamin D level contributes to fatigue and are associated with obesity, breast, and colon cancer. She agrees to continue to take prescription Vitamin D '@50'$ ,000 IU every week and will follow-up for routine testing of Vitamin D, at least 2-3 times per year to avoid over-replacement.  Continue twice weekly.  Refill- Vitamin D, Ergocalciferol, (DRISDOL) 1.25 MG (50000 UNIT) CAPS capsule; 1 tab q wed and 1 tab q sun  Dispense: 8 capsule; Refill: 0   4. Other iron deficiency anemia HgB at goal. Continue prenatal vitamin.  refill- Prenatal Vit-Fe Fum-FA-Omega (PNV PRENATAL PLUS MULTIVIT+DHA) 27-1 & 312 MG MISC; 1 po qd  Dispense: 90 each; Refill: 0   5. BMI 45.0-49.9, adult (HCC)-current bmi 46.2  6. Morbid obesity (HCC)-starting bmi 58.45 Karen Brennan is currently in the action stage of change. As such, her goal is to continue with weight loss efforts. She has agreed to the Category 3 Plan with breakfast and  lunch options..   Exercise goals:  As is, but increase activity as tolerated.  Behavioral modification strategies: decreasing simple carbohydrates, decreasing eating out, and planning for success.  Kashe has agreed to follow-up with our clinic in 3 weeks. She was informed of the importance of frequent follow-up visits to maximize her success with intensive lifestyle modifications for her multiple health conditions.    Objective:   Blood pressure 135/89, pulse 95, temperature 99.5 F (37.5 C), height '6\' 1"'$  (1.854 m), weight (!) 350 lb 6.4 oz (158.9 kg), last menstrual period 05/22/2022, SpO2 95 %. Body mass index is 46.23 kg/m.  General: Cooperative, alert, well developed, in no acute distress. HEENT: Conjunctivae and lids unremarkable. Cardiovascular: Regular rhythm.  Lungs: Normal work of breathing. Neurologic: No focal deficits.   Lab Results  Component Value Date   CREATININE 0.97 07/04/2022   BUN 10 07/04/2022   NA 137 07/04/2022   K 3.4 (L) 07/04/2022   CL 101 07/04/2022   CO2 21 (L) 07/04/2022   Lab Results  Component Value Date   ALT 20 12/31/2021   AST 23 12/31/2021   ALKPHOS 50 12/31/2021   BILITOT 0.5 12/31/2021   Lab Results  Component Value Date   HGBA1C 5.7 (H) 06/01/2022   HGBA1C 6.0 (H) 01/12/2022   HGBA1C 6.0 (H) 10/05/2021   HGBA1C 5.8 (H) 06/20/2021   HGBA1C 6.7 (H) 02/01/2021   Lab Results  Component Value Date   INSULIN 11.0 01/12/2022   INSULIN 22.7 02/01/2021   Lab Results  Component Value Date   TSH 1.090 09/06/2021   Lab Results  Component Value Date   CHOL 189 12/05/2021   HDL 61 12/05/2021   LDLCALC 107 (H) 12/05/2021   TRIG 119 12/05/2021   CHOLHDL 3.1 12/05/2021   Lab Results  Component Value Date  VD25OH 46.1 01/12/2022   VD25OH 37.2 10/05/2021   VD25OH 22.9 (L) 06/20/2021   Lab Results  Component Value Date   WBC 10.4 07/04/2022   HGB 14.1 07/04/2022   HCT 43.4 07/04/2022   MCV 91.2 07/04/2022   PLT 354  07/04/2022   Lab Results  Component Value Date   IRON 30 06/20/2021   TIBC 444 06/20/2021   FERRITIN 8 (L) 06/20/2021   Attestation Statements:   Reviewed by clinician on day of visit: allergies, medications, problem list, medical history, surgical history, family history, social history, and previous encounter notes.  I, Davy Pique, RMA, am acting as Location manager for [employer], DO.  I have reviewed the above documentation for accuracy and completeness, and I agree with the above. Marjory Sneddon, D.O.  The [place] was signed into law in 2016 which includes the topic of electronic health records.  This provides immediate access to information in MyChart.  This includes consultation notes, operative notes, office notes, lab results and pathology reports.  If you have any questions about what you read please let us know at your next visit so we can discuss your concerns and take corrective action if need be.  We are right here with you.

## 2022-08-16 DIAGNOSIS — M25519 Pain in unspecified shoulder: Secondary | ICD-10-CM | POA: Diagnosis not present

## 2022-08-17 DIAGNOSIS — M25519 Pain in unspecified shoulder: Secondary | ICD-10-CM | POA: Diagnosis not present

## 2022-08-18 ENCOUNTER — Telehealth: Payer: Self-pay | Admitting: *Deleted

## 2022-08-18 DIAGNOSIS — M25519 Pain in unspecified shoulder: Secondary | ICD-10-CM | POA: Diagnosis not present

## 2022-08-18 NOTE — Telephone Encounter (Signed)
Patient informed per Dr Claiborne Billings sleep study shows no indication for CPAP at this time.

## 2022-08-18 NOTE — Telephone Encounter (Signed)
-----   Message from Troy Sine, MD sent at 08/11/2022  3:03 PM EST ----- Mariann Laster, please notify pt of results

## 2022-08-21 DIAGNOSIS — M25519 Pain in unspecified shoulder: Secondary | ICD-10-CM | POA: Diagnosis not present

## 2022-08-22 DIAGNOSIS — M25519 Pain in unspecified shoulder: Secondary | ICD-10-CM | POA: Diagnosis not present

## 2022-08-22 DIAGNOSIS — F250 Schizoaffective disorder, bipolar type: Secondary | ICD-10-CM | POA: Diagnosis not present

## 2022-08-23 ENCOUNTER — Encounter (INDEPENDENT_AMBULATORY_CARE_PROVIDER_SITE_OTHER): Payer: Self-pay | Admitting: Family Medicine

## 2022-08-23 ENCOUNTER — Ambulatory Visit (INDEPENDENT_AMBULATORY_CARE_PROVIDER_SITE_OTHER): Payer: Medicaid Other | Admitting: Family Medicine

## 2022-08-23 VITALS — BP 143/81 | HR 104 | Temp 98.2°F | Ht 73.0 in | Wt 348.0 lb

## 2022-08-23 DIAGNOSIS — G4734 Idiopathic sleep related nonobstructive alveolar hypoventilation: Secondary | ICD-10-CM

## 2022-08-23 DIAGNOSIS — K5909 Other constipation: Secondary | ICD-10-CM

## 2022-08-23 DIAGNOSIS — G4736 Sleep related hypoventilation in conditions classified elsewhere: Secondary | ICD-10-CM | POA: Diagnosis not present

## 2022-08-23 DIAGNOSIS — Z7985 Long-term (current) use of injectable non-insulin antidiabetic drugs: Secondary | ICD-10-CM | POA: Diagnosis not present

## 2022-08-23 DIAGNOSIS — E559 Vitamin D deficiency, unspecified: Secondary | ICD-10-CM

## 2022-08-23 DIAGNOSIS — E1169 Type 2 diabetes mellitus with other specified complication: Secondary | ICD-10-CM

## 2022-08-23 DIAGNOSIS — Z6841 Body Mass Index (BMI) 40.0 and over, adult: Secondary | ICD-10-CM

## 2022-08-23 DIAGNOSIS — Z7984 Long term (current) use of oral hypoglycemic drugs: Secondary | ICD-10-CM

## 2022-08-23 DIAGNOSIS — K5900 Constipation, unspecified: Secondary | ICD-10-CM

## 2022-08-23 DIAGNOSIS — M25519 Pain in unspecified shoulder: Secondary | ICD-10-CM | POA: Diagnosis not present

## 2022-08-23 DIAGNOSIS — D508 Other iron deficiency anemias: Secondary | ICD-10-CM | POA: Diagnosis not present

## 2022-08-23 MED ORDER — SEMAGLUTIDE (1 MG/DOSE) 4 MG/3ML ~~LOC~~ SOPN: 1.0000 mg | PEN_INJECTOR | SUBCUTANEOUS | 0 refills | Status: DC

## 2022-08-23 MED ORDER — METFORMIN HCL 1000 MG PO TABS: 1000.0000 mg | ORAL_TABLET | Freq: Two times a day (BID) | ORAL | 0 refills | Status: DC

## 2022-08-23 MED ORDER — VITAMIN D (ERGOCALCIFEROL) 1.25 MG (50000 UNIT) PO CAPS: ORAL_CAPSULE | ORAL | 0 refills | Status: DC

## 2022-08-23 NOTE — Progress Notes (Signed)
Marjory Sneddon, D.O.  ABFM, ABOM Specializing in Clinical Bariatric Medicine  Office located at: [address] [place], [place]  [address]     Assessment and Plan:   No orders of the defined types were placed in this encounter.   Medications Discontinued During This Encounter  Medication Reason   metFORMIN (GLUCOPHAGE) 1000 MG tablet Reorder   Semaglutide, 1 MG/DOSE, 4 MG/3ML SOPN Reorder   Vitamin D, Ergocalciferol, (DRISDOL) 1.25 MG (50000 UNIT) CAPS capsule Reorder     Meds ordered this encounter  Medications   metFORMIN (GLUCOPHAGE) 1000 MG tablet    Sig: Take 1 tablet (1,000 mg total) by mouth 2 (two) times daily with a meal.    Dispense:  60 tablet    Refill:  0   Semaglutide, 1 MG/DOSE, 4 MG/3ML SOPN    Sig: Inject 1 mg as directed once a week.    Dispense:  3 mL    Refill:  0   Vitamin D, Ergocalciferol, (DRISDOL) 1.25 MG (50000 UNIT) CAPS capsule    Sig: 1 tab q wed and 1 tab q sun    Dispense:  8 capsule    Refill:  0    90 d supply;  ** OV for RF **   Do not send RF request     Type 2 diabetes mellitus with obesity ([place]) Assessment: Condition is At goal.. Labs were reviewed.   Lab Results  Component Value Date   HGBA1C 5.7 (H) 06/01/2022   HGBA1C 6.0 (H) 01/12/2022   HGBA1C 6.0 (H) 10/05/2021   INSULIN 11.0 01/12/2022   INSULIN 22.7 02/01/2021  She reports compliance and tolerance of Ozempic and Metformin and feels that her cravings and hunger are well controlled. She denies any GI upset.  Plan: Encouraged to follow prudent nutritional plan and exercise. - Continue Metformin and Ozempic as prescribed. - Intensive lifestyle modification including diet, exercise and weight loss are the first line of treatment for diabetes. We extensively discussed the importance of decreasing simple carbs and how certain foods they eat will affect their blood sugars - Reminded [name] if she feels poorly- check Blood Sugar and Blood Pressure at that time.     - Hypoglycemia prevention discussed with the patient.  Eat on a regular basis- no skipping or going long periods without eating.     - Recommend that any concerns about medicines should be directed at the prescribing provider - Recheck labs 3 months from last if not done at Endo provider / PCP.    BMI 45.0-49.9, adult (HCC)-current bmi 45.9 Morbid obesity ([facility]CC)-starting bmi 58.45/date 02/01/21 Assessment: Condition is Improving, but not optimized..  Biometric data collected today, was reviewed with patient.  Fat mass has decreased by 3lb. Muscle mass has increased by .6lb. Total body water has increased by .8 lb.  Patient is compliant with Ozempic and tolerating it well.   Plan: Continue with Category 3 Plan with breafast and lunch options. Patient also given lean protein handout sheet.    Vitamin D deficiency Assessment: Condition is At goal.. Labs were reviewed.  Lab Results  Component Value Date   VD25OH 46.1 01/12/2022   VD25OH 37.2 10/05/2021   VD25OH 22.9 (L) 06/20/2021  She reports compliance and good tolerance of Ergocalciferol 50K IU twice weekly.    Plan: Patient to continue 50 K IU Ergocalciferol 1 tab q wed and 1 tab q sun- Will refill medication today.    Constipation Assessment: Condition is Controlled.Marland Kitchen  She reports that her constipation resolved with increased water intake- she is now drinking at least 7 bottles of water per day. She does not check her blood sugars at home.  Plan: This is well controlled at this time. Clintona was informed that a decrease in bowel movement frequency is normal while losing weight, but stools should not be hard or painful.   - Adequate daily water intake encouraged along with activity/ movement.    Nocturnal Hypoxemia Assessment: Condition is Not optimized..  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  Plan:Advised her to follow up with her PCP who may consider supplemental O2 at night given this new finding.   Iron deficiency anemia Assessment: Condition is At goal.. Labs were reviewed. Lab Results  Component Value Date   IRON 30 06/20/2021   TIBC 444 06/20/2021   FERRITIN 8 (L) 06/20/2021   Lab Results  Component Value Date   WBC 10.4 07/04/2022   HGB 14.1 07/04/2022   HCT 43.4 07/04/2022   MCV 91.2 07/04/2022   PLT 354 07/04/2022  She reports compliance and good tolerance of prenatals. She denies any symptoms of anemia at this time.  Plan: Advised to continue prenatal vitamin at the recommendations of her PCP. Will continue to monitor labs as indicated.   TREATMENT PLAN FOR OBESITY:  Recommended Dietary Goals Marion is currently in the action stage of change. As such, her goal is to continue weight management plan. She has agreed to the Category 3 Plan with breakfast and lunch options   Behavioral Intervention Additional resources provided today: category 3 meal plan information, lean protein handout sheet Evidence-based interventions for health behavior change were utilized today including the discussion of self monitoring techniques, problem-solving barriers and SMART goal setting techniques.   Regarding patient's less desirable eating habits and patterns, we employed the technique of small changes.  Pt will specifically work on: Continue with good adherence to meal plan and exercise for next visit.    Recommended Physical Activity Goals Ayahna has been advised to work up to 150 minutes of moderate intensity aerobic activity a week and strengthening exercises 2-3 times per week for cardiovascular health, weight loss maintenance and preservation of muscle mass.  She has agreed to Continue current level of physical activity    FOLLOW UP: Return in about 3 weeks (around 09/13/2022).Marland Kitchen She was informed of the importance of frequent follow  up visits to maximize her success with intensive lifestyle modifications for her multiple health conditions.  Subjective:   Chief complaint: Obesity Sherline is here to discuss her progress with her obesity treatment plan. She is on the the Category 3 Plan with breakfast and lunch options and states she is following her eating plan approximately 80% of the time. She states she is exercising 30 minutes 3 days per week.  Protein content sheets Interval History:  LUCYANA NIMER is here for a follow up office visit. We reviewed her meal plan and all questions were answered. Patient's food recall appears to be accurate and consistent with what is on plan when she is following it. When eating on plan, her hunger and cravings are well controlled.     Since last office visit she is following her meal plan and meeting protein goals. She drinks 7 bottles of water daily.   Pharmacotherapy for weight  loss: She is currently taking  Ozempic and Metformin  for medical weight loss.  Denies side effects.    Review of Systems:  Pertinent positives were addressed with patient today.  Weight Summary and Biometrics   Weight Lost Since Last Visit: 2lb   Vitals Temp: 98.2 F (36.8 C) BP: (!) 143/81 Pulse Rate: (!) 104 SpO2: 96 %   Anthropometric Measurements Height: 6\' 1"  (1.854 m) Weight: (!) 348 lb (157.9 kg) BMI (Calculated): 45.92 Weight at Last Visit: 350lb Weight Lost Since Last Visit: 2lb Starting Weight: 443lb Total Weight Loss (lbs): 95 lb (43.1 kg) Peak Weight: 505lb   Body Composition  Body Fat %: 54.8 % Fat Mass (lbs): 190.8 lbs Muscle Mass (lbs): 149.4 lbs Total Body Water (lbs): 110.4 lbs Visceral Fat Rating : 18   Other Clinical Data Fasting: no Labs: no Today's Visit #: 25 Starting Date: 02/01/21    Objective:   PHYSICAL EXAM:  Blood pressure (!) 143/81, pulse (!) 104, temperature 98.2 F (36.8 C), height 6\' 1"  (1.854 m), weight (!) 348 lb (157.9 kg), SpO2 96  %. Body mass index is 45.91 kg/m.  General: Well Developed, well nourished, and in no acute distress.  HEENT: Normocephalic, atraumatic Skin: Warm and dry, cap RF less 2 sec, good turgor Chest:  Normal excursion, shape, no gross abn Respiratory: speaking in full sentences, no conversational dyspnea NeuroM-Sk: Ambulates w/o assistance, moves * 4 Psych: A and O *3, insight good, mood-full  DIAGNOSTIC DATA REVIEWED:  BMET    Component Value Date/Time   NA 137 07/04/2022 1037   NA 142 10/05/2021 1215   K 3.4 (L) 07/04/2022 1037   CL 101 07/04/2022 1037   CO2 21 (L) 07/04/2022 1037   GLUCOSE 208 (H) 07/04/2022 1037   BUN 10 07/04/2022 1037   BUN 10 10/05/2021 1215   CREATININE 0.97 07/04/2022 1037   CREATININE 0.82 07/05/2016 0942   CALCIUM 9.0 07/04/2022 1037   GFRNONAA >60 07/04/2022 1037   GFRNONAA >89 07/05/2016 0942   GFRAA 99 02/12/2020 1423   GFRAA >89 07/05/2016 0942   Lab Results  Component Value Date   HGBA1C 5.7 (H) 06/01/2022   HGBA1C 5.7 09/15/2011   Lab Results  Component Value Date   INSULIN 11.0 01/12/2022   INSULIN 22.7 02/01/2021   Lab Results  Component Value Date   TSH 1.090 09/06/2021   CBC    Component Value Date/Time   WBC 10.4 07/04/2022 1037   RBC 4.76 07/04/2022 1037   HGB 14.1 07/04/2022 1037   HGB 11.1 09/06/2021 1058   HCT 43.4 07/04/2022 1037   HCT 36.4 09/06/2021 1058   PLT 354 07/04/2022 1037   PLT 392 09/06/2021 1058   MCV 91.2 07/04/2022 1037   MCV 78 (L) 09/06/2021 1058   MCH 29.6 07/04/2022 1037   MCHC 32.5 07/04/2022 1037   RDW 12.5 07/04/2022 1037   RDW 14.5 09/06/2021 1058   Iron Studies    Component Value Date/Time   IRON 30 06/20/2021 1205   TIBC 444 06/20/2021 1205   FERRITIN 8 (L) 06/20/2021 1205   IRONPCTSAT 7 (LL) 06/20/2021 1205   Lipid Panel     Component Value Date/Time   CHOL 189 12/05/2021 1406   TRIG 119 12/05/2021 1406   HDL 61 12/05/2021 1406   CHOLHDL 3.1 12/05/2021 1406   CHOLHDL 3.8  12/09/2014 1102   VLDL 25 12/09/2014 1102   LDLCALC 107 (H) 12/05/2021 1406   Hepatic Function Panel  Component Value Date/Time   PROT 7.1 12/31/2021 0940   PROT 7.4 10/05/2021 1215   ALBUMIN 3.7 12/31/2021 0940   ALBUMIN 4.3 10/05/2021 1215   AST 23 12/31/2021 0940   ALT 20 12/31/2021 0940   ALKPHOS 50 12/31/2021 0940   BILITOT 0.5 12/31/2021 0940   BILITOT 0.4 10/05/2021 1215   BILIDIR 0.1 02/02/2009 2254   IBILI 0.6 02/02/2009 2254      Component Value Date/Time   TSH 1.090 09/06/2021 1058   Nutritional Lab Results  Component Value Date   VD25OH 46.1 01/12/2022   VD25OH 37.2 10/05/2021   VD25OH 22.9 (L) 06/20/2021    Attestations:   Reviewed by clinician on day of visit: allergies, medications, problem list, medical history, surgical history, family history, social history, and previous encounter notes.   Patient was in the office today and time spent on visit including pre-visit chart review and post-visit care/coordination of care and electronic medical record documentation was *** minutes. 50% of the time was in face to face counseling of this patient's medical condition(s) and providing education on treatment options to include the first-line treatment of diet and lifestyle modification.   I,Alexis Herring,acting as a Education administrator for [employer], DO.,have documented all relevant documentation on the behalf of Mellody Dance, DO,as directed by  Mellody Dance, DO while in the presence of Mellody Dance, DO.   I, Mellody Dance, DO, have reviewed all documentation for this visit. The documentation on 08/25/22 for the exam, diagnosis, procedures, and orders are all accurate and complete.

## 2022-08-24 ENCOUNTER — Other Ambulatory Visit: Payer: Self-pay

## 2022-08-24 DIAGNOSIS — I10 Essential (primary) hypertension: Secondary | ICD-10-CM

## 2022-08-24 DIAGNOSIS — M25519 Pain in unspecified shoulder: Secondary | ICD-10-CM | POA: Diagnosis not present

## 2022-08-24 MED ORDER — VALSARTAN 80 MG PO TABS: 80.0000 mg | ORAL_TABLET | Freq: Every day | ORAL | 0 refills | Status: DC

## 2022-08-24 MED ORDER — HYDROCHLOROTHIAZIDE 12.5 MG PO TABS: 12.5000 mg | ORAL_TABLET | Freq: Every day | ORAL | 0 refills | Status: DC

## 2022-08-25 DIAGNOSIS — M25519 Pain in unspecified shoulder: Secondary | ICD-10-CM | POA: Diagnosis not present

## 2022-08-25 NOTE — Telephone Encounter (Signed)
Called and provided clarification to pharmacy.   Talbot Grumbling, RN

## 2022-08-25 NOTE — Telephone Encounter (Signed)
Received call from pharmacy regarding dosage on HCTZ. She reports that 12.5 mg is lower dosage than what patient was previously receiving.   She states that last rx was for 25 mg tablets. Requesting clarification on which dosage she should fill.   Please advise.   Talbot Grumbling, RN

## 2022-08-28 DIAGNOSIS — M25519 Pain in unspecified shoulder: Secondary | ICD-10-CM | POA: Diagnosis not present

## 2022-08-29 DIAGNOSIS — M25519 Pain in unspecified shoulder: Secondary | ICD-10-CM | POA: Diagnosis not present

## 2022-08-30 DIAGNOSIS — M25519 Pain in unspecified shoulder: Secondary | ICD-10-CM | POA: Diagnosis not present

## 2022-08-31 DIAGNOSIS — M25519 Pain in unspecified shoulder: Secondary | ICD-10-CM | POA: Diagnosis not present

## 2022-09-01 DIAGNOSIS — M25519 Pain in unspecified shoulder: Secondary | ICD-10-CM | POA: Diagnosis not present

## 2022-09-04 DIAGNOSIS — M25519 Pain in unspecified shoulder: Secondary | ICD-10-CM | POA: Diagnosis not present

## 2022-09-05 ENCOUNTER — Other Ambulatory Visit: Payer: Self-pay | Admitting: Cardiovascular Disease

## 2022-09-05 ENCOUNTER — Telehealth: Payer: Self-pay | Admitting: *Deleted

## 2022-09-05 DIAGNOSIS — M25519 Pain in unspecified shoulder: Secondary | ICD-10-CM | POA: Diagnosis not present

## 2022-09-05 DIAGNOSIS — G4736 Sleep related hypoventilation in conditions classified elsewhere: Secondary | ICD-10-CM

## 2022-09-05 NOTE — Telephone Encounter (Signed)
Left message to return a cal to dicsuss oxygen level.

## 2022-09-06 ENCOUNTER — Ambulatory Visit: Payer: Medicaid Other | Attending: Student | Admitting: Student

## 2022-09-06 ENCOUNTER — Ambulatory Visit (INDEPENDENT_AMBULATORY_CARE_PROVIDER_SITE_OTHER): Payer: Medicaid Other | Admitting: Family Medicine

## 2022-09-06 ENCOUNTER — Encounter: Payer: Self-pay | Admitting: Student

## 2022-09-06 VITALS — BP 105/75 | HR 101 | Ht 73.0 in | Wt 345.0 lb

## 2022-09-06 VITALS — BP 111/84 | HR 90 | Ht 73.0 in | Wt 345.2 lb

## 2022-09-06 DIAGNOSIS — R109 Unspecified abdominal pain: Secondary | ICD-10-CM | POA: Diagnosis not present

## 2022-09-06 DIAGNOSIS — E782 Mixed hyperlipidemia: Secondary | ICD-10-CM | POA: Insufficient documentation

## 2022-09-06 DIAGNOSIS — M25519 Pain in unspecified shoulder: Secondary | ICD-10-CM | POA: Diagnosis not present

## 2022-09-06 DIAGNOSIS — R8290 Unspecified abnormal findings in urine: Secondary | ICD-10-CM

## 2022-09-06 DIAGNOSIS — Z79899 Other long term (current) drug therapy: Secondary | ICD-10-CM | POA: Diagnosis not present

## 2022-09-06 DIAGNOSIS — R002 Palpitations: Secondary | ICD-10-CM

## 2022-09-06 DIAGNOSIS — I10 Essential (primary) hypertension: Secondary | ICD-10-CM | POA: Insufficient documentation

## 2022-09-06 DIAGNOSIS — I493 Ventricular premature depolarization: Secondary | ICD-10-CM | POA: Diagnosis not present

## 2022-09-06 DIAGNOSIS — R10A2 Flank pain, left side: Secondary | ICD-10-CM

## 2022-09-06 LAB — POCT URINALYSIS DIP (MANUAL ENTRY)
Glucose, UA: NEGATIVE mg/dL
Nitrite, UA: NEGATIVE
Spec Grav, UA: >=1.03 — AB (ref 1.010–1.025)
Urobilinogen, UA: 2 E.U./dL — AB
pH, UA: 5.5 (ref 5.0–8.0)

## 2022-09-06 MED ORDER — CEFIXIME 400 MG PO CAPS: 400.0000 mg | ORAL_CAPSULE | Freq: Every day | ORAL | 0 refills | Status: AC

## 2022-09-06 NOTE — Patient Instructions (Addendum)
Medication Instructions:  Your physician recommends that you continue on your current medications as directed. Please refer to the Current Medication list given to you today.  *If you need a refill on your cardiac medications before your next appointment, please call your pharmacy*  Please take your blood pressure daily 2 hours after taking your medicine for 2 weeks and send in a MyChart message. Please include heart rates.   HOW TO TAKE YOUR BLOOD PRESSURE: Rest 5 minutes before taking your blood pressure. Don't smoke or drink caffeinated beverages for at least 30 minutes before. Take your blood pressure before (not after) you eat. Sit comfortably with your back supported and both feet on the floor (don't cross your legs). Elevate your arm to heart level on a table or a desk. Use the proper sized cuff. It should fit smoothly and snugly around your bare upper arm. There should be enough room to slip a fingertip under the cuff. The bottom edge of the cuff should be 1 inch above the crease of the elbow. Ideally, take 3 measurements at one sitting and record the average.  Please increase your hydration.     Lab Work: Your physician recommends that you have the following labs drawn today: CBC, BMET and TSH  If you have labs (blood work) drawn today and your tests are completely normal, you will receive your results only by: [place] (if you have MyChart) OR A paper copy in the mail If you have any lab test that is abnormal or we need to change your treatment, we will call you to review the results.   Testing/Procedures: NONE   Follow-Up: At [facility], you and your health needs are our priority.  As part of our continuing mission to provide you with exceptional heart care, we have created designated Provider Care Teams.  These Care Teams include your primary Cardiologist (physician) and Advanced Practice Providers (APPs -  Physician Assistants and Nurse Practitioners)  who all work together to provide you with the care you need, when you need it.  We recommend signing up for the patient portal called "MyChart".  Sign up information is provided on this After Visit Summary.  MyChart is used to connect with patients for Virtual Visits (Telemedicine).  Patients are able to view lab/test results, encounter notes, upcoming appointments, etc.  Non-urgent messages can be sent to your provider as well.   To learn more about what you can do with MyChart, go to [email or web address].    Your next appointment:   You have a recall in for a 1 year follow up with your primary cardiologist.

## 2022-09-06 NOTE — Progress Notes (Signed)
    SUBJECTIVE:   CHIEF COMPLAINT / HPI:   Flank Pain This is a new problem. Episode onset: Left flank pain x 3 days with abnormal urine odor. The problem has been waxing and waning since onset. Quality: Dul pain. The pain is at a severity of 5/10. The pain is moderate. Exacerbated by: nothing. Associated symptoms include a fever. Pertinent negatives include no dysuria. (Last fever was this morning, Tmax of 102. History of kidney stones. However, her pain is different from when she had a kidney stone.) Risk factors: Hx of urine and kidney infection. She has tried NSAIDs (Tylenol) for the symptoms. The treatment provided moderate relief.   PERTINENT  PMH / PSH: PMHx reviewed  OBJECTIVE:   BP 111/84   Pulse 90   Ht 6\' 1"  (1.854 m)   Wt (!) 345 lb 3.2 oz (156.6 kg)   SpO2 100%   BMI 45.54 kg/m   Physical Exam Vitals and nursing note reviewed.  Cardiovascular:     Rate and Rhythm: Normal rate and regular rhythm.     Heart sounds: Normal heart sounds. No murmur heard. Pulmonary:     Effort: Pulmonary effort is normal. No respiratory distress.     Breath sounds: Normal breath sounds. No wheezing.  Abdominal:     General: Bowel sounds are normal.     Tenderness: There is no abdominal tenderness. There is no right CVA tenderness or left CVA tenderness.      ASSESSMENT/PLAN:  Flank pain with abnormal urine odor. UA showed + Leukocyte with trace blood Urine sent for culture. I will treat empirically for pyelonephritis. Suprax escribed. F/U soon for urine reassessment. She agreed with the plan.    Andrena Mews, MD [place]

## 2022-09-06 NOTE — Progress Notes (Signed)
Cardiology Clinic Note   Date: 09/06/2022 ID: Karen Brennan, DOB 06-10-1976, MRN [id number]  Primary Cardiologist:  Glenetta Hew, MD  Patient Profile    Karen Brennan is a 47 y.o. female who presents to the clinic today for evaluation of increased heart rate.   Past medical history significant for: Palpitations/PVCs. ZIO monitor 11/24/2020: Occasional PVCs (2%). Hypertension. Hyperlipidemia. Lipid panel 12/05/2021: LDL 107, HDL 61, TG 119, total 189. T2DM. Bipolar disorder. S/p gastric bypass.    History of Present Illness    Karen Brennan was first evaluated by Dr. Gardiner Rhyme on 07/14/2019 for shortness of breath and tachycardia at the request of Dr. Nori Riis.  At that time patient reported worsening exercise intolerance and episodes of tachycardia.  She reported feeling like her heart is racing for about 5 minutes and then resolving on its own.  She reported becoming easily fatigued after a few minutes of exercise and shortness of breath with minimal exertion.  Occasional chest pain described as aching in the center of her chest.  She smoked a pack a day for couple of years but quit in 2006.  14 day event monitor showed no significant arrhythmias and occasional PVCs (1.7%).  Echo showed normal LV function with no significant valvular abnormalities.  She underwent sleep study with Dr. Claiborne Billings in February 2023 and CPAP was recommended.  She was last seen in the office by Coletta Memos, NP on 07/06/2022.  At that time she had recently been diagnosed with influenza B and was reporting some mild chest discomfort with increased cough.  She also reported her CPAP was taken away secondary to noncompliance.  She reported she was unable to wear the facemask secondary to poor fit but would be willing to wear the nasal pillows.  Epworth score 7, STOP-BANG 5; patient was referred for a split-night sleep study.  Study did not show need for CPAP at the present time and patient was educated on optimization of  nasal and oropharyngeal patency.  Today, patient reports episodes of elevated heart rate for the last two days. She reports two nights ago she was awakened from sleep with racing heart. She did not get up to check how fast it was going. She is unsure how long it lasted, as she was able to fall back to sleep. The next morning BP was ~130/80 and HR was 126. She rechecked in several hours later and it was 89. Heart rate today is 101. Palpitations feel like they have in the past.  She reports recent illness with fever and malaise with decreased PO intake. She feels her palpitations were well controlled before this. Patient denies shortness of breath or dyspnea on exertion. No chest pain, pressure, or tightness. She is not having palpitations currently. Denies lower extremity edema, orthopnea, or PND. She walks for 30 minutes three times a week.     ROS: All other systems reviewed and are otherwise negative except as noted in History of Present Illness.  Studies Reviewed    ECG not ordered today.    Physical Exam    VS:  BP 105/75 (BP Location: Left Arm, Patient Position: Sitting, Cuff Size: Large)   Pulse (!) 101   Ht 6\' 1"  (1.854 m)   Wt (!) 345 lb (156.5 kg)   BMI 45.52 kg/m  , BMI Body mass index is 45.52 kg/m.  GEN: Well nourished, well developed, in no acute distress. Neck: No JVD or carotid bruits. Cardiac:  RRR. No murmurs. No rubs or  gallops.   Respiratory:  Respirations regular and unlabored. Clear to auscultation without rales, wheezing or rhonchi. GI: Soft, nontender, nondistended. Extremities: Radials/DP/PT 2+ and equal bilaterally. No clubbing or cyanosis. No edema.  Skin: Warm and dry, no rash. Neuro: Strength intact.  Assessment & Plan   Palpitations/PVCs.  ZIO monitor June 2022 showed occasional PVCs (2%).  Patient reports good control until 2 days ago. She states she was awakened from sleep with heart racing. She did not check the rate and was able to go back to sleep.  The next morning BP was ~130/80 and HR 126. It eventually came down to 89. She reports recent fever and malaise with decreased PO intake. BP today 105/75, HR 101. Will get CBC, BMP, and TSH today. She will track BP and HR over the next 2 weeks and call in or my chart the readings. If her HR continues to be increased will consider changing metoprolol tartrate to Toprol XL to see if this will provide better control.  Continue metoprolol at current dose.  Hypertension.  BP today 105/75. Patient denies dizziness or headaches.  Continue metoprolol, hydrochlorothiazide, valsartan. Hyperlipidemia.  June 2023 107, not at goal.  Continue atorvastatin and diet modifications.  Followed by PCP.  Disposition: CBC, BMP, TSH today. Track BP and HR for two weeks and call or my chart with readings. Recall for Dr. Ellyn Hack already in system.          Signed, [place]. Amran Malter, DNP, NP-C

## 2022-09-06 NOTE — Patient Instructions (Signed)
Pyelonephritis, Adult  Pyelonephritis is an infection in the kidney. The kidneys are the parts of the body that help clean the blood. They move waste out of the blood and into the pee (urine). This infection can happen fast, or it can last for a long time. In most cases, it clears up with treatment and does not cause other problems. What are the causes? This infection may be caused by germs (bacteria). The germs may go: From your bladder up into your kidney. This may happen after you have a bladder infection. From your blood into your kidney. What increases the risk? You are more likely to get this condition if: You are pregnant. You are older. You have: Diabetes. Prostatitis. This is irritation and swelling of the prostate gland. Kidney stones or bladder stones. Other problems with your kidney or the parts of your body that carry pee from the kidneys to the bladder (ureters). Cancer. You have a soft tube called a catheter in your bladder. You are sexually active. You use a medicine that kills sperm (spermicide). This may be used to prevent pregnancy. You have had a urinary tract infection (UTI) before. What are the signs or symptoms? Peeing often. Feeling the need to pee right away. A burning or stinging feeling when you pee. Pain in your belly, back, or side. Fever or chills. Vomiting or feeling like you may vomit. Dark pee or blood in your pee. How is this treated? You may be given antibiotics to take. You will need to drink lots of fluids. If the infection is bad, you may need to stay in the hospital. You may be given antibiotics and fluids through an IV. In some cases, other treatments may be needed. Follow these instructions at home: Eating and drinking Drink enough fluid to keep your pee pale yellow. Avoid caffeine, tea, and drinks that are bubbly (carbonated). General instructions Take over-the-counter and prescription medicines as told by your doctor. Finish your  antibiotics even if you start to feel better. Pee (urinate) often. Do not hold in your pee for long periods of time. Pee before and after you have sex. If you are female, wipe from front to back after you poop (have a bowel movement). Use each tissue only once. Keep all follow-up visits. Your doctor will want to make sure that you are getting better. Contact a doctor if: You do not feel better after 2 days. Your symptoms get worse. You have a fever or chills. You cannot take your medicine. Get help right away if: You vomit each time that you eat or drink. You have very bad pain in your back or side. You are very weak, or you faint. This information is not intended to replace advice given to you by your health care provider. Make sure you discuss any questions you have with your health care provider. Document Revised: 12/19/2021 Document Reviewed: 12/19/2021 Elsevier Patient Education  [place].

## 2022-09-07 DIAGNOSIS — M25519 Pain in unspecified shoulder: Secondary | ICD-10-CM | POA: Diagnosis not present

## 2022-09-07 LAB — CBC
Hematocrit: 42.8 % (ref 34.0–46.6)
Hemoglobin: 13.9 g/dL (ref 11.1–15.9)
MCH: 29.1 pg (ref 26.6–33.0)
MCHC: 32.5 g/dL (ref 31.5–35.7)
MCV: 90 fL (ref 79–97)
Platelets: 388 10*3/uL (ref 150–450)
RBC: 4.78 x10E6/uL (ref 3.77–5.28)
RDW: 12.2 % (ref 11.7–15.4)
WBC: 6.6 10*3/uL (ref 3.4–10.8)

## 2022-09-07 LAB — BASIC METABOLIC PANEL
BUN/Creatinine Ratio: 12 (ref 9–23)
BUN: 9 mg/dL (ref 6–24)
CO2: 25 mmol/L (ref 20–29)
Calcium: 9.6 mg/dL (ref 8.7–10.2)
Chloride: 101 mmol/L (ref 96–106)
Creatinine, Ser: 0.78 mg/dL (ref 0.57–1.00)
Glucose: 119 mg/dL — ABNORMAL HIGH (ref 70–99)
Potassium: 4.1 mmol/L (ref 3.5–5.2)
Sodium: 142 mmol/L (ref 134–144)
eGFR: 95 mL/min/{1.73_m2} (ref >59)

## 2022-09-07 LAB — TSH: TSH: 0.877 u[IU]/mL (ref 0.450–4.500)

## 2022-09-08 DIAGNOSIS — G809 Cerebral palsy, unspecified: Secondary | ICD-10-CM | POA: Diagnosis not present

## 2022-09-09 LAB — URINE CULTURE

## 2022-09-11 ENCOUNTER — Telehealth: Payer: Self-pay

## 2022-09-11 DIAGNOSIS — G809 Cerebral palsy, unspecified: Secondary | ICD-10-CM | POA: Diagnosis not present

## 2022-09-11 NOTE — Telephone Encounter (Signed)
Test result discussed. Ecoli UTI pan-sensitive. She has one more day of A/B treatment. Symptoms improved, but still has back pain. F?U recommended.  She agreed with the plan. Appointment made.

## 2022-09-11 NOTE — Telephone Encounter (Signed)
Patient calls nurse line requesting to speak with provider regarding urine culture results.   She was able to view these via mychart and would like to discuss next steps.   Forwarding to Dr. Gwendlyn Deutscher.   Talbot Grumbling, RN

## 2022-09-12 ENCOUNTER — Ambulatory Visit (INDEPENDENT_AMBULATORY_CARE_PROVIDER_SITE_OTHER): Payer: Medicaid Other | Admitting: Family Medicine

## 2022-09-12 VITALS — BP 111/83 | HR 100 | Ht 73.0 in | Wt 343.0 lb

## 2022-09-12 DIAGNOSIS — E669 Obesity, unspecified: Secondary | ICD-10-CM | POA: Diagnosis not present

## 2022-09-12 DIAGNOSIS — R110 Nausea: Secondary | ICD-10-CM | POA: Diagnosis not present

## 2022-09-12 DIAGNOSIS — R319 Hematuria, unspecified: Secondary | ICD-10-CM | POA: Diagnosis not present

## 2022-09-12 DIAGNOSIS — N390 Urinary tract infection, site not specified: Secondary | ICD-10-CM | POA: Diagnosis not present

## 2022-09-12 DIAGNOSIS — E1169 Type 2 diabetes mellitus with other specified complication: Secondary | ICD-10-CM | POA: Diagnosis not present

## 2022-09-12 DIAGNOSIS — N3289 Other specified disorders of bladder: Secondary | ICD-10-CM | POA: Diagnosis not present

## 2022-09-12 DIAGNOSIS — G809 Cerebral palsy, unspecified: Secondary | ICD-10-CM | POA: Diagnosis not present

## 2022-09-12 LAB — POCT URINALYSIS DIP (MANUAL ENTRY)
Bilirubin, UA: NEGATIVE
Blood, UA: NEGATIVE
Glucose, UA: NEGATIVE mg/dL
Ketones, POC UA: NEGATIVE mg/dL
Nitrite, UA: NEGATIVE
Protein Ur, POC: NEGATIVE mg/dL
Spec Grav, UA: >=1.03 — AB (ref 1.010–1.025)
Urobilinogen, UA: 1 E.U./dL
pH, UA: 5.5 (ref 5.0–8.0)

## 2022-09-12 MED ORDER — ONDANSETRON HCL 4 MG PO TABS: 4.0000 mg | ORAL_TABLET | Freq: Three times a day (TID) | ORAL | 0 refills | Status: DC | PRN

## 2022-09-12 MED ORDER — ACETAMINOPHEN-CODEINE 300-30 MG PO TABS: 1.0000 | ORAL_TABLET | Freq: Four times a day (QID) | ORAL | 0 refills | Status: AC | PRN

## 2022-09-12 MED ORDER — PHENAZOPYRIDINE HCL 100 MG PO TABS
100.0000 mg | ORAL_TABLET | Freq: Three times a day (TID) | ORAL | 0 refills | Status: AC
Start: 2022-09-12 — End: 2022-09-15

## 2022-09-12 NOTE — Progress Notes (Unsigned)
    SUBJECTIVE:   CHIEF COMPLAINT / HPI:   Urinary complaint - Last seen at [facility] 3/27 with Dr. Gwendlyn Deutscher, dx UTI, Rx cefixime (Suprax) - Culture collected shows pansensitive E. coli - Since completing the antibiotic, patient reports no fever or chills - Still having some nausea s/p antibiotic - Also still having some left abdomen and flank pain  PERTINENT  PMH / PSH:  Patient Active Problem List   Diagnosis Date Noted   Morbid obesity (HCC)-starting bmi 58.45 07/12/2022   BMI 45.0-49.9, adult (HCC)-current bmi 46.8 07/12/2022   Chronic constipation 06/14/2022   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   Class 3 severe obesity with serious comorbidity and body mass index (BMI) of 50.0 to 59.9 in adult 05/01/2022   Seasonal allergies 05/01/2022   Type 2 diabetes mellitus with obesity 04/10/2022   Absolute anemia 03/13/2022   Hyperlipidemia associated with type 2 diabetes mellitus 12/05/2021   B12 deficiency 08/29/2021   Type II diabetes mellitus 08/29/2021   Vitamin D deficiency 08/29/2021   Constipation 08/29/2021   PVC's (premature ventricular contractions) 07/19/2021   Intertrigo 02/16/2020   Tachycardia 123XX123   Systolic murmur 123XX123   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   Neuropathic pain of both feet 05/09/2016   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   Anxiety 10/25/2011   Urinary tract infection with hematuria 10/12/2011   Bipolar 1 disorder, mixed 09/15/2011   Allergic rhinitis 08/31/2008   Microcytic anemia 08/14/2008   Esophageal reflux 01/24/2008   Hypertension associated with diabetes ([place]) 08/17/2006   Morbid obesity with BMI of 50.0-59.9, adult 08/09/2006    OBJECTIVE:   BP 111/83   Pulse 100   Ht 6\' 1"  (1.854 m)   Wt (!) 343 lb (155.6 kg)   SpO2 98%   BMI 45.25 kg/m    General: Awake, alert, NAD Cardiac: Regular rate and rhythm, no murmur Respiratory: CTAB anteriorly Abdomen: No tenderness to palpation in any  quadrant, no TTP in suprapubic area MSK: Negative CVA tenderness bilaterally  ASSESSMENT/PLAN:   Urinary tract infection with hematuria UA today reassuring, only abnormalities are elevated specific gravity and some leukocytes.  Given patient is afebrile has normalized UA, declined to treat further.  Culture showed pansensitive E. coli, for which prior prescription of cefixime is sufficient.  Patient reports very bad flank pain and some nausea still, specifically requests pain meds and nausea meds. - Pyridium 3 times daily x 3 days for mucosal irritation - Zofran 4 mg tablets every 8 as needed - Tylenol No. 3, rx 12 tablets -Return precautions given, see AVS for more  Type 2 diabetes mellitus with obesity ([place]) - Sent for urine microalbumin today given normalized UA and need for screening with T2DM     Karen Essex, MD [place]

## 2022-09-12 NOTE — Patient Instructions (Addendum)
It was wonderful to meet you today. Thank you for allowing me to be a part of your care. Below is a short summary of what we discussed at your visit today:  Urine infection Today, we collected a urine sample which showed no evidence of ongoing infection.   Tylenol #3 (with codeine) for pain. Use sparingly.  Pyridium as prescribed for bladder irritation.  Zofran for nausea.   Return if fever, chills, or worsening symptoms.   Health Maintenance We like to think about ways to keep you healthy for years to come. Below are some interventions and screenings we can offer to keep you healthy: - PAP smear (please schedule here at your convenience) - COVID vaccine - seasonal flu vaccine  Please bring all of your medications to every appointment!  If you have any questions or concerns, please do not hesitate to contact us via phone or MyChart message.   Ezequiel Essex, MD

## 2022-09-13 ENCOUNTER — Encounter (INDEPENDENT_AMBULATORY_CARE_PROVIDER_SITE_OTHER): Payer: Self-pay | Admitting: Family Medicine

## 2022-09-13 ENCOUNTER — Ambulatory Visit (INDEPENDENT_AMBULATORY_CARE_PROVIDER_SITE_OTHER): Payer: Medicaid Other | Admitting: Family Medicine

## 2022-09-13 VITALS — BP 137/84 | HR 98 | Temp 99.2°F | Ht 73.0 in | Wt 339.4 lb

## 2022-09-13 DIAGNOSIS — D508 Other iron deficiency anemias: Secondary | ICD-10-CM | POA: Diagnosis not present

## 2022-09-13 DIAGNOSIS — K5909 Other constipation: Secondary | ICD-10-CM

## 2022-09-13 DIAGNOSIS — E1169 Type 2 diabetes mellitus with other specified complication: Secondary | ICD-10-CM

## 2022-09-13 DIAGNOSIS — Z6841 Body Mass Index (BMI) 40.0 and over, adult: Secondary | ICD-10-CM | POA: Diagnosis not present

## 2022-09-13 DIAGNOSIS — E559 Vitamin D deficiency, unspecified: Secondary | ICD-10-CM

## 2022-09-13 DIAGNOSIS — Z7984 Long term (current) use of oral hypoglycemic drugs: Secondary | ICD-10-CM | POA: Diagnosis not present

## 2022-09-13 DIAGNOSIS — Z7985 Long-term (current) use of injectable non-insulin antidiabetic drugs: Secondary | ICD-10-CM | POA: Diagnosis not present

## 2022-09-13 DIAGNOSIS — G809 Cerebral palsy, unspecified: Secondary | ICD-10-CM | POA: Diagnosis not present

## 2022-09-13 MED ORDER — BLOOD GLUCOSE MONITORING SUPPL DEVI
1.0000 | Freq: Two times a day (BID) | 0 refills | Status: DC
Start: 2022-09-13 — End: 2023-10-11

## 2022-09-13 MED ORDER — LANCETS MISC. MISC
1.0000 | Freq: Two times a day (BID) | 0 refills | Status: AC
Start: 2022-09-13 — End: 2022-10-13

## 2022-09-13 MED ORDER — PNV PRENATAL PLUS MULTIVIT+DHA 27-1 & 312 MG PO MISC: ORAL | 0 refills | Status: DC

## 2022-09-13 MED ORDER — LINACLOTIDE 72 MCG PO CAPS: 72.0000 ug | ORAL_CAPSULE | Freq: Every day | ORAL | 0 refills | Status: DC

## 2022-09-13 MED ORDER — METFORMIN HCL 1000 MG PO TABS: 1000.0000 mg | ORAL_TABLET | Freq: Two times a day (BID) | ORAL | 0 refills | Status: DC

## 2022-09-13 MED ORDER — VITAMIN D (ERGOCALCIFEROL) 1.25 MG (50000 UNIT) PO CAPS
ORAL_CAPSULE | ORAL | 0 refills | Status: DC
Start: 2022-09-13 — End: 2022-10-02

## 2022-09-13 MED ORDER — SEMAGLUTIDE (1 MG/DOSE) 4 MG/3ML ~~LOC~~ SOPN
1.0000 mg | PEN_INJECTOR | SUBCUTANEOUS | 0 refills | Status: DC
Start: 2022-09-13 — End: 2022-10-02

## 2022-09-13 MED ORDER — BLOOD GLUCOSE TEST VI STRP
1.0000 | ORAL_STRIP | Freq: Two times a day (BID) | 0 refills | Status: AC
Start: 2022-09-13 — End: 2022-10-13

## 2022-09-13 NOTE — Progress Notes (Signed)
Karen Brennan, D.O.  ABFM, ABOM Specializing in Clinical Bariatric Medicine  Office located at: [address] [place], [place]  [address]     Assessment and Plan:   Orders Placed This Encounter  Procedures   VITAMIN D 25 Hydroxy (Vit-D Deficiency, Fractures)   Hemoglobin A1c    Medications Discontinued During This Encounter  Medication Reason   Prenatal Vit-Fe Fum-FA-Omega (PNV PRENATAL PLUS MULTIVIT+DHA) 27-1 & 312 MG MISC Reorder   linaclotide (LINZESS) 72 MCG capsule Reorder   metFORMIN (GLUCOPHAGE) 1000 MG tablet Reorder   Semaglutide, 1 MG/DOSE, 4 MG/3ML SOPN Reorder   Vitamin D, Ergocalciferol, (DRISDOL) 1.25 MG (50000 UNIT) CAPS capsule Reorder   Lancet Devices (ACCU-CHEK SOFTCLIX) lancets    Blood Glucose Monitoring Suppl (ACCU-CHEK GUIDE) w/Device KIT    glucose blood (ACCU-CHEK GUIDE) test strip    Blood Glucose Monitoring Suppl (ACCU-CHEK GUIDE) w/Device KIT      Meds ordered this encounter  Medications   linaclotide (LINZESS) 72 MCG capsule    Sig: Take 1 capsule (72 mcg total) by mouth daily before breakfast.    Dispense:  30 capsule    Refill:  0   metFORMIN (GLUCOPHAGE) 1000 MG tablet    Sig: Take 1 tablet (1,000 mg total) by mouth 2 (two) times daily with a meal.    Dispense:  60 tablet    Refill:  0   Prenatal Vit-Fe Fum-FA-Omega (PNV PRENATAL PLUS MULTIVIT+DHA) 27-1 & 312 MG MISC    Sig: 1 po qd    Dispense:  90 each    Refill:  0   Semaglutide, 1 MG/DOSE, 4 MG/3ML SOPN    Sig: Inject 1 mg as directed once a week.    Dispense:  3 mL    Refill:  0   Vitamin D, Ergocalciferol, (DRISDOL) 1.25 MG (50000 UNIT) CAPS capsule    Sig: 1 tab q wed and 1 tab q sun    Dispense:  8 capsule    Refill:  0    90 d supply;  ** OV for RF **   Do not send RF request   Blood Glucose Monitoring Suppl DEVI    Sig: 1 each by Does not apply route 2 (two) times daily. May substitute to any manufacturer covered by patient's insurance. check sugars fasting and  post-prandial daily.    Dispense:  1 each    Refill:  0   Glucose Blood (BLOOD GLUCOSE TEST STRIPS) STRP    Sig: 1 each by Does not apply route 2 (two) times daily. May substitute to any manufacturer covered by patient's insurance. check sugars fasting and post-prandial daily.    Dispense:  60 each    Refill:  0   Lancets Misc. MISC    Sig: 1 each by Does not apply route in the morning and at bedtime. May substitute to any manufacturer covered by patient's insurance. check sugars fasting and post-prandial daily.    Dispense:  100 each    Refill:  0    Check A1c and Vitamin D at next OV  Type 2 diabetes mellitus with obesity Assessment: Condition is Not optimized. Lab Results  Component Value Date   HGBA1C 5.7 (H) 06/01/2022   HGBA1C 6.0 (H) 01/12/2022   HGBA1C 6.0 (H) 10/05/2021   INSULIN 11.0 01/12/2022   INSULIN 22.7 02/01/2021  Patient has been more compliant with Metformin 1,000 mg BID and Semaglutide 1 mg weekly. Her hunger and cravings have been well controlled. She  has been eating all the foods on the meal plan even when she feels full. She has not been checking blood sugars at home lately.   Plan: Will refill Metformin and Semaglutide today.  Continue with meds. Check sugars twice daily at home. I placed an order for a Blood Glucose monitoring device. Continue her prudent nutritional plan and continue to advance exercise and cardiovascular fitness as tolerated.    Other constipation Assessment: Condition is stable. Patient has been weaning off the Linzess and is taking it prn. Patient drinks approximately 1 gallon of water daily.   Plan: Will refill Linzess today.   - I recommended that she can also take Miralax OTC as it is less powerful than the Linzess and because her constipation is improving.  Continue drinking 1 gallon of water daily.   Vitamin D deficiency Assessment: Condition is Improving, but not optimized. Lab Results  Component Value Date   VD25OH 46.1  01/12/2022   VD25OH 37.2 10/05/2021   VD25OH 22.9 (L) 06/20/2021  Patient is compliant with  Ergocalciferol 50K IU twice weekly. Denies any side effects.  Plan: Will refill Vitamin D today. Continue with Ergocalciferol 50K IU twice weekly  - weight loss will likely improve availability of vitamin D, thus encouraged Renn to continue with meal plan and their weight loss efforts to further improve this condition.  Thus, we will need to monitor levels regularly (every 3-4 mo on average) to keep levels within normal limits and prevent over supplementation.   Other iron deficiency anemia Assessment: Condition is stable. Lab Results  Component Value Date   WBC 6.6 09/06/2022   HGB 13.9 09/06/2022   HCT 42.8 09/06/2022   MCV 90 09/06/2022   PLT 388 09/06/2022  She reports good compliance and tolerance with Prenatal Vitamins  Plan: Will refill Prenatal Vitamins today.  Continue with prenatal vitamins at the recommendation of her PCP. Continue her prudent nutritional plan and continue to advance exercise and cardiovascular fitness as tolerated.   TREATMENT PLAN FOR OBESITY: BMI 45.0-49.9, adult (HCC)-current bmi 44.8 Morbid obesity (HCC)-starting bmi 58.45/date 02/01/21 Assessment: Condition is Improving, but not optimized..  Fat mass has decreased by 14.6lb. Muscle mass has increased by 5.6lb. Total body water has decreased by 0.6lb.   Plan: Continue with  Category 3 meal plan with breakfast and lunch options.  Behavioral Intervention Additional resources provided today: patient declined need for any Evidence-based interventions for health behavior change were utilized today including the discussion of self monitoring techniques, problem-solving barriers and SMART goal setting techniques.   Regarding patient's less desirable eating habits and patterns, we employed the technique of small changes.  Pt will specifically work on: continue with Category 3 meal plan with breakfast and lunch  options and continue walking regiment for next visit.    Recommended Physical Activity Goals She has agreed to Continue current level of physical activity   FOLLOW UP: Return in about 3 weeks (around 10/04/2022). She was informed of the importance of frequent follow up visits to maximize her success with intensive lifestyle modifications for her multiple health conditions.  Subjective:   Chief complaint: Obesity Karen Brennan is here to discuss her progress with her obesity treatment plan. She is on the the Category 3 Plan with breakfast and lunch options and states she is following her eating plan approximately 80% of the time. She states she is exercising 30 minutes 3 days per week.  Interval History:  Karen Brennan is here for a follow up office visit.  Since last office visit she has been sick on two occasions, however she has still been mostly hitting her protein and caloric goals.  We reviewed her meal plan and all questions were answered. Patient's food recall appears to be accurate and consistent with what is on plan when she is following it. When eating on plan, her hunger and cravings are well controlled.      Pharmacotherapy for weight loss: She is currently taking  Semaglutide and Metformin   for medical weight loss.  Denies side effects.    Review of Systems:  Pertinent positives were addressed with patient today.  Weight Summary and Biometrics   Weight Lost Since Last Visit: 9lb  No data recorded   Vitals Temp: 99.2 F (37.3 C) BP: 137/84 Pulse Rate: 98 SpO2: 98 %   Anthropometric Measurements Height: 6\' 1"  (1.854 m) Weight: (!) 339 lb 6.4 oz (154 kg) BMI (Calculated): 44.79 Weight Lost Since Last Visit: 9lb Starting Weight: 443lb Total Weight Loss (lbs): 104 lb (47.2 kg) Peak Weight: 505lb   Body Composition  Body Fat %: 51.9 % Fat Mass (lbs): 176.2 lbs Muscle Mass (lbs): 155 lbs Total Body Water (lbs): 109.8 lbs Visceral Fat Rating : 16   Other Clinical  Data Fasting: no Labs: no Today's Visit #: 26 Starting Date: 02/01/21    Objective:   PHYSICAL EXAM:  Blood pressure 137/84, pulse 98, temperature 99.2 F (37.3 C), height 6\' 1"  (1.854 m), weight (!) 339 lb 6.4 oz (154 kg), SpO2 98 %. Body mass index is 44.78 kg/m.  General: Well Developed, well nourished, and in no acute distress.  HEENT: Normocephalic, atraumatic Skin: Warm and dry, cap RF less 2 sec, good turgor Chest:  Normal excursion, shape, no gross abn Respiratory: speaking in full sentences, no conversational dyspnea NeuroM-Sk: Ambulates w/o assistance, moves * 4 Psych: A and O *3, insight good, mood-full  DIAGNOSTIC DATA REVIEWED:  BMET    Component Value Date/Time   NA 142 09/06/2022 1057   K 4.1 09/06/2022 1057   CL 101 09/06/2022 1057   CO2 25 09/06/2022 1057   GLUCOSE 119 (H) 09/06/2022 1057   GLUCOSE 208 (H) 07/04/2022 1037   BUN 9 09/06/2022 1057   CREATININE 0.78 09/06/2022 1057   CREATININE 0.82 07/05/2016 0942   CALCIUM 9.6 09/06/2022 1057   GFRNONAA >60 07/04/2022 1037   GFRNONAA >89 07/05/2016 0942   GFRAA 99 02/12/2020 1423   GFRAA >89 07/05/2016 0942   Lab Results  Component Value Date   HGBA1C 5.7 (H) 06/01/2022   HGBA1C 5.7 09/15/2011   Lab Results  Component Value Date   INSULIN 11.0 01/12/2022   INSULIN 22.7 02/01/2021   Lab Results  Component Value Date   TSH 0.877 09/06/2022   CBC    Component Value Date/Time   WBC 6.6 09/06/2022 1057   WBC 10.4 07/04/2022 1037   RBC 4.78 09/06/2022 1057   RBC 4.76 07/04/2022 1037   HGB 13.9 09/06/2022 1057   HCT 42.8 09/06/2022 1057   PLT 388 09/06/2022 1057   MCV 90 09/06/2022 1057   MCH 29.1 09/06/2022 1057   MCH 29.6 07/04/2022 1037   MCHC 32.5 09/06/2022 1057   MCHC 32.5 07/04/2022 1037   RDW 12.2 09/06/2022 1057   Iron Studies    Component Value Date/Time   IRON 30 06/20/2021 1205   TIBC 444 06/20/2021 1205   FERRITIN 8 (L) 06/20/2021 1205   IRONPCTSAT 7 (LL)  06/20/2021 1205   Lipid Panel  Component Value Date/Time   CHOL 189 12/05/2021 1406   TRIG 119 12/05/2021 1406   HDL 61 12/05/2021 1406   CHOLHDL 3.1 12/05/2021 1406   CHOLHDL 3.8 12/09/2014 1102   VLDL 25 12/09/2014 1102   LDLCALC 107 (H) 12/05/2021 1406   Hepatic Function Panel     Component Value Date/Time   PROT 7.1 12/31/2021 0940   PROT 7.4 10/05/2021 1215   ALBUMIN 3.7 12/31/2021 0940   ALBUMIN 4.3 10/05/2021 1215   AST 23 12/31/2021 0940   ALT 20 12/31/2021 0940   ALKPHOS 50 12/31/2021 0940   BILITOT 0.5 12/31/2021 0940   BILITOT 0.4 10/05/2021 1215   BILIDIR 0.1 02/02/2009 2254   IBILI 0.6 02/02/2009 2254      Component Value Date/Time   TSH 0.877 09/06/2022 1057   Nutritional Lab Results  Component Value Date   VD25OH 46.1 01/12/2022   VD25OH 37.2 10/05/2021   VD25OH 22.9 (L) 06/20/2021    Attestations:   Reviewed by clinician on day of visit: allergies, medications, problem list, medical history, surgical history, family history, social history, and previous encounter notes.   I,Special Puri,acting as a Education administrator for [employer], DO.,have documented all relevant documentation on the behalf of Mellody Dance, DO,as directed by  Mellody Dance, DO while in the presence of Mellody Dance, DO.  I, Mellody Dance, DO, have reviewed all documentation for this visit. The documentation on 09/13/22 for the exam, diagnosis, procedures, and orders are all accurate and complete.

## 2022-09-13 NOTE — Assessment & Plan Note (Signed)
-   Sent for urine microalbumin today given normalized UA and need for screening with T2DM

## 2022-09-13 NOTE — Assessment & Plan Note (Signed)
UA today reassuring, only abnormalities are elevated specific gravity and some leukocytes.  Given patient is afebrile has normalized UA, declined to treat further.  Culture showed pansensitive E. coli, for which prior prescription of cefixime is sufficient.  Patient reports very bad flank pain and some nausea still, specifically requests pain meds and nausea meds. - Pyridium 3 times daily x 3 days for mucosal irritation - Zofran 4 mg tablets every 8 as needed - Tylenol No. 3, rx 12 tablets -Return precautions given, see AVS for more

## 2022-09-13 NOTE — Assessment & Plan Note (Signed)
>>  ASSESSMENT AND PLAN FOR TYPE 2 DIABETES MELLITUS WITH OBESITY (HCC) WRITTEN ON 09/13/2022  5:14 PM BY Fayette Pho, MD  - Sent for urine microalbumin today given normalized UA and need for screening with T2DM

## 2022-09-14 DIAGNOSIS — G809 Cerebral palsy, unspecified: Secondary | ICD-10-CM | POA: Diagnosis not present

## 2022-09-15 DIAGNOSIS — G809 Cerebral palsy, unspecified: Secondary | ICD-10-CM | POA: Diagnosis not present

## 2022-09-18 DIAGNOSIS — G809 Cerebral palsy, unspecified: Secondary | ICD-10-CM | POA: Diagnosis not present

## 2022-09-19 DIAGNOSIS — G809 Cerebral palsy, unspecified: Secondary | ICD-10-CM | POA: Diagnosis not present

## 2022-09-20 DIAGNOSIS — G809 Cerebral palsy, unspecified: Secondary | ICD-10-CM | POA: Diagnosis not present

## 2022-09-21 DIAGNOSIS — G809 Cerebral palsy, unspecified: Secondary | ICD-10-CM | POA: Diagnosis not present

## 2022-09-22 DIAGNOSIS — G809 Cerebral palsy, unspecified: Secondary | ICD-10-CM | POA: Diagnosis not present

## 2022-09-25 ENCOUNTER — Other Ambulatory Visit: Payer: Self-pay | Admitting: Family Medicine

## 2022-09-25 DIAGNOSIS — G5793 Unspecified mononeuropathy of bilateral lower limbs: Secondary | ICD-10-CM

## 2022-09-25 DIAGNOSIS — G809 Cerebral palsy, unspecified: Secondary | ICD-10-CM | POA: Diagnosis not present

## 2022-09-26 DIAGNOSIS — G809 Cerebral palsy, unspecified: Secondary | ICD-10-CM | POA: Diagnosis not present

## 2022-09-27 DIAGNOSIS — G809 Cerebral palsy, unspecified: Secondary | ICD-10-CM | POA: Diagnosis not present

## 2022-09-28 DIAGNOSIS — G809 Cerebral palsy, unspecified: Secondary | ICD-10-CM | POA: Diagnosis not present

## 2022-09-29 DIAGNOSIS — G809 Cerebral palsy, unspecified: Secondary | ICD-10-CM | POA: Diagnosis not present

## 2022-10-02 ENCOUNTER — Telehealth: Payer: Self-pay | Admitting: Cardiology

## 2022-10-02 ENCOUNTER — Telehealth: Payer: Self-pay | Admitting: Student

## 2022-10-02 ENCOUNTER — Ambulatory Visit (INDEPENDENT_AMBULATORY_CARE_PROVIDER_SITE_OTHER): Payer: Medicaid Other | Admitting: Family Medicine

## 2022-10-02 ENCOUNTER — Encounter (INDEPENDENT_AMBULATORY_CARE_PROVIDER_SITE_OTHER): Payer: Self-pay | Admitting: Family Medicine

## 2022-10-02 VITALS — BP 128/80 | HR 129 | Temp 98.3°F | Ht 73.0 in | Wt 344.0 lb

## 2022-10-02 DIAGNOSIS — Z6841 Body Mass Index (BMI) 40.0 and over, adult: Secondary | ICD-10-CM | POA: Diagnosis not present

## 2022-10-02 DIAGNOSIS — E1169 Type 2 diabetes mellitus with other specified complication: Secondary | ICD-10-CM

## 2022-10-02 DIAGNOSIS — R000 Tachycardia, unspecified: Secondary | ICD-10-CM

## 2022-10-02 DIAGNOSIS — E559 Vitamin D deficiency, unspecified: Secondary | ICD-10-CM | POA: Diagnosis not present

## 2022-10-02 DIAGNOSIS — F250 Schizoaffective disorder, bipolar type: Secondary | ICD-10-CM | POA: Diagnosis not present

## 2022-10-02 DIAGNOSIS — Z7984 Long term (current) use of oral hypoglycemic drugs: Secondary | ICD-10-CM | POA: Diagnosis not present

## 2022-10-02 DIAGNOSIS — D508 Other iron deficiency anemias: Secondary | ICD-10-CM

## 2022-10-02 DIAGNOSIS — K5909 Other constipation: Secondary | ICD-10-CM | POA: Diagnosis not present

## 2022-10-02 DIAGNOSIS — Z7985 Long-term (current) use of injectable non-insulin antidiabetic drugs: Secondary | ICD-10-CM

## 2022-10-02 DIAGNOSIS — G809 Cerebral palsy, unspecified: Secondary | ICD-10-CM | POA: Diagnosis not present

## 2022-10-02 DIAGNOSIS — I10 Essential (primary) hypertension: Secondary | ICD-10-CM

## 2022-10-02 MED ORDER — VALSARTAN 80 MG PO TABS: 80.0000 mg | ORAL_TABLET | Freq: Every day | ORAL | 3 refills | Status: DC

## 2022-10-02 MED ORDER — SEMAGLUTIDE (1 MG/DOSE) 4 MG/3ML ~~LOC~~ SOPN: 1.0000 mg | PEN_INJECTOR | SUBCUTANEOUS | 0 refills | Status: DC

## 2022-10-02 MED ORDER — HYDROCHLOROTHIAZIDE 12.5 MG PO TABS
12.5000 mg | ORAL_TABLET | Freq: Every day | ORAL | 3 refills | Status: DC
Start: 2022-10-02 — End: 2023-08-08

## 2022-10-02 MED ORDER — VITAMIN D (ERGOCALCIFEROL) 1.25 MG (50000 UNIT) PO CAPS
ORAL_CAPSULE | ORAL | 0 refills | Status: DC
Start: 2022-10-02 — End: 2022-11-15

## 2022-10-02 MED ORDER — ATORVASTATIN CALCIUM 20 MG PO TABS: 20.0000 mg | ORAL_TABLET | Freq: Every day | ORAL | 3 refills | Status: DC

## 2022-10-02 MED ORDER — LINACLOTIDE 72 MCG PO CAPS: 72.0000 ug | ORAL_CAPSULE | Freq: Every day | ORAL | 0 refills | Status: DC

## 2022-10-02 MED ORDER — METOPROLOL TARTRATE 25 MG PO TABS: ORAL_TABLET | ORAL | 3 refills | Status: DC

## 2022-10-02 MED ORDER — METFORMIN HCL 1000 MG PO TABS
1000.0000 mg | ORAL_TABLET | Freq: Two times a day (BID) | ORAL | 0 refills | Status: DC
Start: 2022-10-02 — End: 2022-11-15

## 2022-10-02 NOTE — Telephone Encounter (Signed)
STAT if HR is under 50 or over 120 (normal HR is 60-100 beats per minute)  What is your heart rate? HR 129  Do you have a log of your heart rate readings (document readings)? States she doesn't have anything documented prior to today, but will start documenting  Do you have any other symptoms? Fatigue, and get dizzy when standing.    Pt states she is out of her metoprolol, and has been out since Saturday. She states at her visit last month, she was told that if this continued, that the medication would be changed. In case medication can get refilled - refill info is below.  *STAT* If patient is at the pharmacy, call can be transferred to refill team.   1. Which medications need to be refilled? (please list name of each medication and dose if known) metoprolol tartrate (LOPRESSOR) 25 MG tablet   2. Which pharmacy/location (including street and city if local pharmacy) is medication to be sent to?  [facility] - [place], [place] - [address]    3. Do they need a 30 day or 90 day supply? 90 day, if its possible   Was going to transfer pt to triage due to high HR, pt hung up.

## 2022-10-02 NOTE — Telephone Encounter (Signed)
Spoke to pt. Pt is out of Metoprolol. Looked over last office visit notes. Pt is to continue medications. Per the note:  Hypertension.  BP today 105/75. Patient denies dizziness or headaches.  Continue metoprolol, hydrochlorothiazide, valsartan. Hyperlipidemia.  June 2023 107, not at goal.  Continue atorvastatin and diet modifications.  Followed by PCP.    Refills sent to pt's preferred pharmacy. Pt state she is dizzy at times, pt advised to go from sitting to standing slowly. Tips of how to get an accurate blood pressure reading sent to pt via MyChart.

## 2022-10-02 NOTE — Progress Notes (Deleted)
    SUBJECTIVE:   CHIEF COMPLAINT / HPI:   NASTASSJA WITKOP is a 47 y.o. female who presents to the [facility] clinic today to discuss the following concerns:   "High Heart Rate" She did call her cardiologist office yesterday reporting that she is out of her metoprolol.  She received refills.  Medication Refills   PERTINENT  PMH / PSH: Type 2 diabetes, hypertension, PVCs, bipolar 1 disorder, hyperlipidemia, class III obesity  OBJECTIVE:   There were no vitals taken for this visit.   General: NAD, pleasant, able to participate in exam Cardiac: RRR, no murmurs. Respiratory: CTAB, normal effort, No wheezes, rales or rhonchi Abdomen: Bowel sounds present, nontender, nondistended, no hepatosplenomegaly. Extremities: no edema or cyanosis. Skin: warm and dry, no rashes noted Neuro: alert, no obvious focal deficits Psych: Normal affect and mood  ASSESSMENT/PLAN:   No problem-specific Assessment & Plan notes found for this encounter.     Sabino Dick, DO [facility]

## 2022-10-02 NOTE — Telephone Encounter (Signed)
Hello!  I received the following message through secure chat. I am not in the office today. Will someone please contact the patient and determine if she needs to be seen. She has also been out of her medication so she will need that sent in.  She is a patient of Dr. Herbie Baltimore.   "hello, this is Deb Opalski, a [facility] doc at [facility]. I am seeing Karen Brennan today and she has been out of her metoprolol for 2 days, with HR- 120-128. BP- 128/80 today. I saw your recent OV note with her regarding these symptoms and they have not seemed to have changed at all. She complains of some increased fatigue with exercise, but otherwise is asx. Denies feeling the heart palpitations. Should I have her call your office for further direction or will you have someone call her? Her new number is [phone]. Please advise. Thanks!"   Thank you!  Etta Grandchild. Cylinda Santoli, DNP, NP-C  10/02/2022, 12:13 PM [facility] [address] Office [phone] Fax [phone]

## 2022-10-02 NOTE — Progress Notes (Signed)
[name], D.O.  ABFM, ABOM Specializing in Clinical Bariatric Medicine  Office located at: [address] [place], [place]  [address]     Assessment and Plan:   Orders Placed This Encounter  Procedures   T4, free   TSH   Magnesium   A1c   Vitamin D     Medications Discontinued During This Encounter  Medication Reason   linaclotide (LINZESS) 72 MCG capsule Reorder   metFORMIN (GLUCOPHAGE) 1000 MG tablet Reorder   Semaglutide, 1 MG/DOSE, 4 MG/3ML SOPN Reorder   Vitamin D, Ergocalciferol, (DRISDOL) 1.25 MG (50000 UNIT) CAPS capsule Reorder     Meds ordered this encounter  Medications   Semaglutide, 1 MG/DOSE, 4 MG/3ML SOPN    Sig: Inject 1 mg as directed once a week.    Dispense:  3 mL    Refill:  0   metFORMIN (GLUCOPHAGE) 1000 MG tablet    Sig: Take 1 tablet (1,000 mg total) by mouth 2 (two) times daily with a meal.    Dispense:  60 tablet    Refill:  0   linaclotide (LINZESS) 72 MCG capsule    Sig: Take 1 capsule (72 mcg total) by mouth daily before breakfast.    Dispense:  30 capsule    Refill:  0   Vitamin D, Ergocalciferol, (DRISDOL) 1.25 MG (50000 UNIT) CAPS capsule    Sig: 1 tab q wed and 1 tab q sun    Dispense:  8 capsule    Refill:  0    90 d supply;  ** OV for RF **   Do not send RF request    Type 2 diabetes mellitus with obesity  Assessment: Condition is Improving, but not optimized..  Lab Results  Component Value Date   HGBA1C 5.7 (H) 06/01/2022   HGBA1C 6.0 (H) 01/12/2022   HGBA1C 6.0 (H) 10/05/2021   INSULIN 11.0 01/12/2022   INSULIN 22.7 02/01/2021  She reports better compliance and tolerance with Metformin 1,000 mg BID and Semaglutide 1 mg weekly. Her hunger and cravings are controlled when eating on plan.  Patient has not obtained her lancets, blood glucose test strips, and blood glucose monitoring device from pharmacy sent in at last OV yet.   Plan: Recheck A1c today. Will refill Metformin and Semaglutide. Continue with  both meds as prescribed.  - Check blood sugars regularly at home when she obtains the devices/proper equipment. -Continue her prudent nutritional plan that is low in simple carbohydrates, saturated fats and trans fats to goal of 5-10% weight loss to achieve significant health benefits.     Vitamin D deficiency Assessment: Condition is improving, but not optimized. Lab Results  Component Value Date   VD25OH 46.1 01/12/2022   VD25OH 37.2 10/05/2021   VD25OH 22.9 (L) 06/20/2021  Patient has been more compliant with Ergocalciferol 50K IU weekly. Denies any side effects.  Plan: recheck labs. Will refill Ergocalciferol 50K IU weekly today. Continue with med.  - weight loss will likely improve availability of vitamin D, thus encouraged Darsha to continue with meal plan and their weight loss efforts to further improve this condition.     Other constipation Assessment: Condition is stable. Patient has been weaning off the Linzess and is taking it prn. Patient endorses that she has been drinking less than 1 gallon of water daily due to stress.   Plan: Will refill Linzess today. Continue with med prn.  Drink at least half of your weight in ounces of water  per day unless otherwise noted by one of your doctors that you must restrict water intake.  Eat plenty of fiber (goal is over 30-40 grams each day).  It is best to get most of your fiber from dietary sources which includes leafy green vegetables, fresh fruit, and whole grains.     Tachycardia Assessment: Condition is acutely worsened Lab Results  Component Value Date   TSH 0.877 09/06/2022   FREET4 1.29 02/01/2021   Lab Results  Component Value Date   CREATININE 0.78 09/06/2022   BUN 9 09/06/2022   NA 142 09/06/2022   K 4.1 09/06/2022   CL 101 09/06/2022   CO2 25 09/06/2022   Lab Results  Component Value Date   WBC 6.6 09/06/2022   HGB 13.9 09/06/2022   HCT 42.8 09/06/2022   MCV 90 09/06/2022   PLT 388 09/06/2022   Lab Results   Component Value Date   CREATININE 0.78 09/06/2022   BUN 9 09/06/2022   NA 142 09/06/2022   K 4.1 09/06/2022   CL 101 09/06/2022   CO2 25 09/06/2022  Her Last BMP and CBC, which was done on 09/06/22, were both within normal limits.  On 09/06/22, patient also saw cardiology for increased heart rate and palpitations.  Today, I checked Jaeline's pulse at my office and it was 120 bpm. She endorses that she takes Metoprolol 500 mg per cardiology for her heart palpitations, however she has been off of it for 2 days because she ran out of her prescription and the pharmacy was unable to refill. Today, patient denies difficulty breathing or heart palpitations. However, she endorses feeling more fatigued than usual while walking.   Plan: recheck labs that weren't drawn recently- tsh, F t4, mag.  I contacted and informed the patients Cardiology NP regarding her being off the Metoprolol for the last 2 days, her pulse value  and the  associated symptoms. Here is the message I sent:  "Hello, this is Deb Mahiya Kercheval, a [facility] doc at [facility].  I am seeing My Bicknell today and she has been out of her metoprolol for 2 days, with HR- 120-128. BP- 128/80 today.  I saw your recent OV note with her regarding these symptoms and they have not seemed to have changed at all.  She complains of some increased fatigue with exercise, but otherwise is asx.   Denies feeling the heart palpitations.  Should I have her call your office for further direction or will you have someone call her?  Her new number is [phone].  Please advise. Thanks!"  I was informed that the message would go to triage and someone would contact the pt later today by Ms Daine Floras. Lawanna Kobus has agreed to track her blood pressure and pulses as per cardiology and send those values to NP [place] in 4-5 days.  - I told pt to contact her cardiology team if she had not heard anything from them by 2pm today.  - Continue with med- when obtained- and any  treatment as recommended by cardiology.    TREATMENT PLAN FOR OBESITY: BMI 45.0-49.9, adult (HCC)-current bmi 44.8 Morbid obesity (HCC)-starting bmi 58.45/date 02/01/21 Assessment: Condition is not optimized. Fat mass has increased by 11.2lb. Muscle mass has decreased by 6.2lb. Total body water has decreased by 2.4lb.   Plan:  Jaeleen is currently in the action stage of change. As such, her goal is to continue weight management plan. Nora will work on [employer] habits and continue the Category  3 meal plan with breakfast and lunch options.   Behavioral Intervention Additional resources provided today: patient declined Evidence-based interventions for health behavior change were utilized today including the discussion of self monitoring techniques, problem-solving barriers and SMART goal setting techniques.   Regarding patient's less desirable eating habits and patterns, we employed the technique of small changes.  Pt will specifically work on: continue adherence to the Category 3 meal plan for next visit.    Recommended Physical Activity Goals Amarachi has been advised to work up to 150 minutes of moderate intensity aerobic activity a week and strengthening exercises 2-3 times per week for cardiovascular health, weight loss maintenance and preservation of muscle mass.  She has agreed to Continue current level of physical activity   FOLLOW UP: Return in about 3 weeks (around 10/23/2022). She was informed of the importance of frequent follow up visits to maximize her success with intensive lifestyle modifications for her multiple health conditions.  ADAMARIZ GILLOTT is aware that we will review all of her lab results at our next visit.  She is aware that if anything is critical/ life threatening with the results, we will be contacting her via MyChart prior to the office visit to discuss management.    Subjective:   Chief complaint: Obesity Nhyla is here to discuss her progress with her  obesity treatment plan. She is on the the Category 3 Plan with breakfast and lunch options and states she is following her eating plan approximately 80% of the time. She states she is exercising 30-45 minutes 3-4 days per week.  Interval History:  MALENI SEYER is here for a follow up office visit. Since last office visit she has been snacking more and skipping meals because of the increased stress in her life. She also endorses not drinking enough water.    Pharmacotherapy for weight loss: She is currently taking  Semaglutide and Metformin  for medical weight loss.  Denies side effects.    Review of Systems:  Pertinent positives were addressed with patient today.   Weight Summary and Biometrics   Weight Lost Since Last Visit: 0  Weight Gained Since Last Visit: 5lb    Vitals Temp: 98.3 F (36.8 C) BP: 128/80 Pulse Rate: (!) 129 SpO2: 100 %   Anthropometric Measurements Height:  (1.854 m) Weight: (!) 344 lb (156 kg) BMI (Calculated): 45.4 Weight at Last Visit: 339lb Weight Lost Since Last Visit: 0 Weight Gained Since Last Visit: 5lb Starting Weight: 443lb Total Weight Loss (lbs): 98 lb (44.5 kg) Peak Weight: 505lb   Body Composition  Body Fat %: 54.5 % Fat Mass (lbs): 187.4 lbs Muscle Mass (lbs): 148.8 lbs Total Body Water (lbs): 112.2 lbs Visceral Fat Rating : 17   Other Clinical Data Fasting: yes Labs: ys Today's Visit #: [id number] Starting Date: 02/01/21    Objective:   PHYSICAL EXAM: Blood pressure 128/80, pulse (!) 129 ( repeat 120bpm in office with me via pulse oximeter and palpation confirmed), temperature 98.3 F (36.8 C), height  (1.854 m), weight (!) 344 lb (156 kg), SpO2 100 %. Body mass index is 45.39 kg/m.  General: Well Developed, well nourished, and in no acute distress.  HEENT: Normocephalic, atraumatic Skin: Warm and dry, cap RF less 2 sec, good turgor Chest:  Normal excursion, shape, no gross abn Respiratory: speaking in full  sentences, no conversational dyspnea NeuroM-Sk: Ambulates w/o assistance, moves * 4 Psych: A and O *3, insight good, mood-full  DIAGNOSTIC DATA REVIEWED:  BMET    Component Value Date/Time   NA 142 09/06/2022 1057   K 4.1 09/06/2022 1057   CL 101 09/06/2022 1057   CO2 25 09/06/2022 1057   GLUCOSE 119 (H) 09/06/2022 1057   GLUCOSE 208 (H) 07/04/2022 1037   BUN 9 09/06/2022 1057   CREATININE 0.78 09/06/2022 1057   CREATININE 0.82 07/05/2016 0942   CALCIUM 9.6 09/06/2022 1057   GFRNONAA >60 07/04/2022 1037   GFRNONAA >89 07/05/2016 0942   GFRAA 99 02/12/2020 1423   GFRAA >89 07/05/2016 0942   Lab Results  Component Value Date   HGBA1C 5.7 (H) 06/01/2022   HGBA1C 5.7 09/15/2011   Lab Results  Component Value Date   INSULIN 11.0 01/12/2022   INSULIN 22.7 02/01/2021   Lab Results  Component Value Date   TSH 0.877 09/06/2022   CBC    Component Value Date/Time   WBC 6.6 09/06/2022 1057   WBC 10.4 07/04/2022 1037   RBC 4.78 09/06/2022 1057   RBC 4.76 07/04/2022 1037   HGB 13.9 09/06/2022 1057   HCT 42.8 09/06/2022 1057   PLT 388 09/06/2022 1057   MCV 90 09/06/2022 1057   MCH 29.1 09/06/2022 1057   MCH 29.6 07/04/2022 1037   MCHC 32.5 09/06/2022 1057   MCHC 32.5 07/04/2022 1037   RDW 12.2 09/06/2022 1057   Iron Studies    Component Value Date/Time   IRON 30 06/20/2021 1205   TIBC 444 06/20/2021 1205   FERRITIN 8 (L) 06/20/2021 1205   IRONPCTSAT 7 (LL) 06/20/2021 1205   Lipid Panel     Component Value Date/Time   CHOL 189 12/05/2021 1406   TRIG 119 12/05/2021 1406   HDL 61 12/05/2021 1406   CHOLHDL 3.1 12/05/2021 1406   CHOLHDL 3.8 12/09/2014 1102   VLDL 25 12/09/2014 1102   LDLCALC 107 (H) 12/05/2021 1406   Hepatic Function Panel     Component Value Date/Time   PROT 7.1 12/31/2021 0940   PROT 7.4 10/05/2021 1215   ALBUMIN 3.7 12/31/2021 0940   ALBUMIN 4.3 10/05/2021 1215   AST 23 12/31/2021 0940   ALT 20 12/31/2021 0940   ALKPHOS 50 12/31/2021  0940   BILITOT 0.5 12/31/2021 0940   BILITOT 0.4 10/05/2021 1215   BILIDIR 0.1 02/02/2009 2254   IBILI 0.6 02/02/2009 2254      Component Value Date/Time   TSH 0.877 09/06/2022 1057   Nutritional Lab Results  Component Value Date   VD25OH 46.1 01/12/2022   VD25OH 37.2 10/05/2021   VD25OH 22.9 (L) 06/20/2021    Attestations:   Reviewed by clinician on day of visit: allergies, medications, problem list, medical history, surgical history, family history, social history, and previous encounter notes.  Patient was in the office today and time spent on visit including pre-visit chart review and post-visit care/coordination of care and electronic medical record documentation was 50 minutes. 50% of the time was in face to face counseling of this patient's medical condition(s) and providing education on treatment options to include the first-line treatment of diet and lifestyle modification.   I,Special Puri,acting as a Neurosurgeon for [employer], DO.,have documented all relevant documentation on the behalf of Thomasene Lot, DO,as directed by  Thomasene Lot, DO while in the presence of Thomasene Lot, DO.   I, Thomasene Lot, DO, have reviewed all documentation for this visit. The documentation on 10/02/22 for the exam, diagnosis, procedures, and orders are all accurate and complete.

## 2022-10-02 NOTE — Telephone Encounter (Signed)
Spoke to pt. See chart.  

## 2022-10-02 NOTE — Telephone Encounter (Signed)
Called patient left message on personal voice mail to call back. 

## 2022-10-02 NOTE — Telephone Encounter (Signed)
Patient returned RN's call. 

## 2022-10-03 ENCOUNTER — Ambulatory Visit: Payer: Medicaid Other

## 2022-10-03 DIAGNOSIS — G809 Cerebral palsy, unspecified: Secondary | ICD-10-CM | POA: Diagnosis not present

## 2022-10-03 LAB — VITAMIN D 25 HYDROXY (VIT D DEFICIENCY, FRACTURES): Vit D, 25-Hydroxy: 45.6 ng/mL (ref 30.0–100.0)

## 2022-10-03 LAB — T4, FREE: Free T4: 1.24 ng/dL (ref 0.82–1.77)

## 2022-10-03 LAB — TSH: TSH: 0.973 u[IU]/mL (ref 0.450–4.500)

## 2022-10-03 LAB — MAGNESIUM: Magnesium: 2 mg/dL (ref 1.6–2.3)

## 2022-10-03 LAB — HEMOGLOBIN A1C
Est. average glucose Bld gHb Est-mCnc: 111 mg/dL
Hgb A1c MFr Bld: 5.5 % (ref 4.8–5.6)

## 2022-10-04 DIAGNOSIS — G809 Cerebral palsy, unspecified: Secondary | ICD-10-CM | POA: Diagnosis not present

## 2022-10-05 DIAGNOSIS — G809 Cerebral palsy, unspecified: Secondary | ICD-10-CM | POA: Diagnosis not present

## 2022-10-06 DIAGNOSIS — G809 Cerebral palsy, unspecified: Secondary | ICD-10-CM | POA: Diagnosis not present

## 2022-10-09 DIAGNOSIS — G809 Cerebral palsy, unspecified: Secondary | ICD-10-CM | POA: Diagnosis not present

## 2022-10-10 DIAGNOSIS — G809 Cerebral palsy, unspecified: Secondary | ICD-10-CM | POA: Diagnosis not present

## 2022-10-11 DIAGNOSIS — G809 Cerebral palsy, unspecified: Secondary | ICD-10-CM | POA: Diagnosis not present

## 2022-10-12 DIAGNOSIS — G809 Cerebral palsy, unspecified: Secondary | ICD-10-CM | POA: Diagnosis not present

## 2022-10-13 DIAGNOSIS — G809 Cerebral palsy, unspecified: Secondary | ICD-10-CM | POA: Diagnosis not present

## 2022-10-16 DIAGNOSIS — G809 Cerebral palsy, unspecified: Secondary | ICD-10-CM | POA: Diagnosis not present

## 2022-10-17 DIAGNOSIS — G809 Cerebral palsy, unspecified: Secondary | ICD-10-CM | POA: Diagnosis not present

## 2022-10-18 DIAGNOSIS — G809 Cerebral palsy, unspecified: Secondary | ICD-10-CM | POA: Diagnosis not present

## 2022-10-19 DIAGNOSIS — G809 Cerebral palsy, unspecified: Secondary | ICD-10-CM | POA: Diagnosis not present

## 2022-10-20 DIAGNOSIS — G809 Cerebral palsy, unspecified: Secondary | ICD-10-CM | POA: Diagnosis not present

## 2022-10-23 DIAGNOSIS — G809 Cerebral palsy, unspecified: Secondary | ICD-10-CM | POA: Diagnosis not present

## 2022-10-24 DIAGNOSIS — G809 Cerebral palsy, unspecified: Secondary | ICD-10-CM | POA: Diagnosis not present

## 2022-10-25 ENCOUNTER — Ambulatory Visit (INDEPENDENT_AMBULATORY_CARE_PROVIDER_SITE_OTHER): Payer: Medicaid Other | Admitting: Family Medicine

## 2022-10-25 DIAGNOSIS — G809 Cerebral palsy, unspecified: Secondary | ICD-10-CM | POA: Diagnosis not present

## 2022-10-26 DIAGNOSIS — G809 Cerebral palsy, unspecified: Secondary | ICD-10-CM | POA: Diagnosis not present

## 2022-10-27 DIAGNOSIS — G809 Cerebral palsy, unspecified: Secondary | ICD-10-CM | POA: Diagnosis not present

## 2022-10-30 DIAGNOSIS — G809 Cerebral palsy, unspecified: Secondary | ICD-10-CM | POA: Diagnosis not present

## 2022-10-30 DIAGNOSIS — F250 Schizoaffective disorder, bipolar type: Secondary | ICD-10-CM | POA: Diagnosis not present

## 2022-10-31 DIAGNOSIS — F250 Schizoaffective disorder, bipolar type: Secondary | ICD-10-CM | POA: Diagnosis not present

## 2022-10-31 DIAGNOSIS — G809 Cerebral palsy, unspecified: Secondary | ICD-10-CM | POA: Diagnosis not present

## 2022-11-01 ENCOUNTER — Encounter: Payer: Self-pay | Admitting: Cardiovascular Disease

## 2022-11-01 DIAGNOSIS — G809 Cerebral palsy, unspecified: Secondary | ICD-10-CM | POA: Diagnosis not present

## 2022-11-02 DIAGNOSIS — G809 Cerebral palsy, unspecified: Secondary | ICD-10-CM | POA: Diagnosis not present

## 2022-11-03 DIAGNOSIS — R0789 Other chest pain: Secondary | ICD-10-CM | POA: Diagnosis not present

## 2022-11-06 DIAGNOSIS — R0789 Other chest pain: Secondary | ICD-10-CM | POA: Diagnosis not present

## 2022-11-07 DIAGNOSIS — R0789 Other chest pain: Secondary | ICD-10-CM | POA: Diagnosis not present

## 2022-11-08 ENCOUNTER — Other Ambulatory Visit (INDEPENDENT_AMBULATORY_CARE_PROVIDER_SITE_OTHER): Payer: Self-pay | Admitting: Family Medicine

## 2022-11-08 DIAGNOSIS — E559 Vitamin D deficiency, unspecified: Secondary | ICD-10-CM

## 2022-11-08 DIAGNOSIS — K5909 Other constipation: Secondary | ICD-10-CM

## 2022-11-08 DIAGNOSIS — E119 Type 2 diabetes mellitus without complications: Secondary | ICD-10-CM

## 2022-11-08 DIAGNOSIS — R0789 Other chest pain: Secondary | ICD-10-CM | POA: Diagnosis not present

## 2022-11-08 DIAGNOSIS — E1169 Type 2 diabetes mellitus with other specified complication: Secondary | ICD-10-CM

## 2022-11-09 ENCOUNTER — Other Ambulatory Visit: Payer: Self-pay

## 2022-11-09 DIAGNOSIS — J302 Other seasonal allergic rhinitis: Secondary | ICD-10-CM

## 2022-11-09 DIAGNOSIS — R0789 Other chest pain: Secondary | ICD-10-CM | POA: Diagnosis not present

## 2022-11-09 DIAGNOSIS — G5793 Unspecified mononeuropathy of bilateral lower limbs: Secondary | ICD-10-CM

## 2022-11-09 MED ORDER — GABAPENTIN 300 MG PO CAPS: 300.0000 mg | ORAL_CAPSULE | Freq: Three times a day (TID) | ORAL | 1 refills | Status: DC

## 2022-11-09 MED ORDER — ALBUTEROL SULFATE HFA 108 (90 BASE) MCG/ACT IN AERS
INHALATION_SPRAY | RESPIRATORY_TRACT | 1 refills | Status: DC
Start: 2022-11-09 — End: 2022-12-07

## 2022-11-09 MED ORDER — LORATADINE 10 MG PO TABS: 10.0000 mg | ORAL_TABLET | Freq: Every day | ORAL | 0 refills | Status: DC

## 2022-11-09 MED ORDER — FLUTICASONE PROPIONATE 50 MCG/ACT NA SUSP
NASAL | 0 refills | Status: DC
Start: 2022-11-09 — End: 2022-12-07

## 2022-11-10 DIAGNOSIS — R0789 Other chest pain: Secondary | ICD-10-CM | POA: Diagnosis not present

## 2022-11-13 DIAGNOSIS — R0789 Other chest pain: Secondary | ICD-10-CM | POA: Diagnosis not present

## 2022-11-14 DIAGNOSIS — R0789 Other chest pain: Secondary | ICD-10-CM | POA: Diagnosis not present

## 2022-11-15 ENCOUNTER — Ambulatory Visit (INDEPENDENT_AMBULATORY_CARE_PROVIDER_SITE_OTHER): Payer: Medicaid Other | Admitting: Family Medicine

## 2022-11-15 ENCOUNTER — Encounter (INDEPENDENT_AMBULATORY_CARE_PROVIDER_SITE_OTHER): Payer: Self-pay | Admitting: Family Medicine

## 2022-11-15 VITALS — BP 151/85 | HR 86 | Temp 98.7°F | Ht 73.0 in | Wt 344.0 lb

## 2022-11-15 DIAGNOSIS — E538 Deficiency of other specified B group vitamins: Secondary | ICD-10-CM

## 2022-11-15 DIAGNOSIS — E1169 Type 2 diabetes mellitus with other specified complication: Secondary | ICD-10-CM

## 2022-11-15 DIAGNOSIS — Z7985 Long-term (current) use of injectable non-insulin antidiabetic drugs: Secondary | ICD-10-CM | POA: Diagnosis not present

## 2022-11-15 DIAGNOSIS — Z6841 Body Mass Index (BMI) 40.0 and over, adult: Secondary | ICD-10-CM

## 2022-11-15 DIAGNOSIS — E559 Vitamin D deficiency, unspecified: Secondary | ICD-10-CM

## 2022-11-15 DIAGNOSIS — Z7984 Long term (current) use of oral hypoglycemic drugs: Secondary | ICD-10-CM

## 2022-11-15 DIAGNOSIS — D508 Other iron deficiency anemias: Secondary | ICD-10-CM | POA: Diagnosis not present

## 2022-11-15 DIAGNOSIS — K5909 Other constipation: Secondary | ICD-10-CM

## 2022-11-15 DIAGNOSIS — R0789 Other chest pain: Secondary | ICD-10-CM | POA: Diagnosis not present

## 2022-11-15 MED ORDER — LINACLOTIDE 72 MCG PO CAPS
72.0000 ug | ORAL_CAPSULE | Freq: Every day | ORAL | 0 refills | Status: DC
Start: 2022-11-15 — End: 2022-12-06

## 2022-11-15 MED ORDER — SEMAGLUTIDE (1 MG/DOSE) 4 MG/3ML ~~LOC~~ SOPN: 1.0000 mg | PEN_INJECTOR | SUBCUTANEOUS | 0 refills | Status: DC

## 2022-11-15 MED ORDER — PNV PRENATAL PLUS MULTIVIT+DHA 27-1 & 312 MG PO MISC
ORAL | 0 refills | Status: DC
Start: 2022-11-15 — End: 2022-12-06

## 2022-11-15 MED ORDER — VITAMIN B-12 1000 MCG PO TABS: 1000.0000 ug | ORAL_TABLET | Freq: Every day | ORAL | 1 refills | Status: DC

## 2022-11-15 MED ORDER — METFORMIN HCL 1000 MG PO TABS: 1000.0000 mg | ORAL_TABLET | Freq: Two times a day (BID) | ORAL | 0 refills | Status: DC

## 2022-11-15 MED ORDER — VITAMIN D (ERGOCALCIFEROL) 1.25 MG (50000 UNIT) PO CAPS
ORAL_CAPSULE | ORAL | 0 refills | Status: DC
Start: 2022-11-15 — End: 2022-12-06

## 2022-11-15 NOTE — Progress Notes (Signed)
Karen Brennan, D.O.  ABFM, ABOM Specializing in Clinical Bariatric Medicine  Office located at: [address] [place], [place]  [address]     Assessment and Plan:   Medications Discontinued During This Encounter  Medication Reason   vitamin B-12 (CYANOCOBALAMIN) 1000 MCG tablet Reorder   Prenatal Vit-Fe Fum-FA-Omega (PNV PRENATAL PLUS MULTIVIT+DHA) 27-1 & 312 MG MISC Reorder   Semaglutide, 1 MG/DOSE, 4 MG/3ML SOPN Reorder   metFORMIN (GLUCOPHAGE) 1000 MG tablet Reorder   linaclotide (LINZESS) 72 MCG capsule Reorder   Vitamin D, Ergocalciferol, (DRISDOL) 1.25 MG (50000 UNIT) CAPS capsule Reorder     Meds ordered this encounter  Medications   Vitamin D, Ergocalciferol, (DRISDOL) 1.25 MG (50000 UNIT) CAPS capsule    Sig: 1 tab q wed and 1 tab q sun    Dispense:  8 capsule    Refill:  0    90 d supply;  ** OV for RF **   Do not send RF request   cyanocobalamin (VITAMIN B12) 1000 MCG tablet    Sig: Take 1 tablet (1,000 mcg total) by mouth daily.    Dispense:  30 tablet    Refill:  1   linaclotide (LINZESS) 72 MCG capsule    Sig: Take 1 capsule (72 mcg total) by mouth daily before breakfast.    Dispense:  30 capsule    Refill:  0   metFORMIN (GLUCOPHAGE) 1000 MG tablet    Sig: Take 1 tablet (1,000 mg total) by mouth 2 (two) times daily with a meal.    Dispense:  60 tablet    Refill:  0   Prenatal Vit-Fe Fum-FA-Omega (PNV PRENATAL PLUS MULTIVIT+DHA) 27-1 & 312 MG MISC    Sig: 1 po qd    Dispense:  90 each    Refill:  0   Semaglutide, 1 MG/DOSE, 4 MG/3ML SOPN    Sig: Inject 1 mg as directed once a week.    Dispense:  3 mL    Refill:  0     Type 2 diabetes mellitus with obesity (HCC) Assessment: Condition is at goal. Labs were reviewed with patient today and education provided on them. We discussed how the foods patient eats may influence these laboratory findings.  All of the patient's questions about them were answered   Lab Results  Component Value Date    HGBA1C 5.5 10/02/2022   HGBA1C 5.7 (H) 06/01/2022   HGBA1C 6.0 (H) 01/12/2022   INSULIN 11.0 01/12/2022   INSULIN 22.7 02/01/2021   Lab Results  Component Value Date   TSH 0.973 10/02/2022   FREET4 1.24 10/02/2022  - No issues with Metformin 1,000 mg BID and Semaglutide 1 mg weekly. Denies any GI effects.  - Labs indicate that: Her A1c levels have improved from 5.7 on 06/01/22 to 5.5 on 10/02/22. Her Magnesium levels are stable at 2.0 and stable.Her Thyroid levels are within normal limits.   Plan: - Continue with antidiabetic medications. Will refill Semaglutide and Metformin today.  Pt declines need for increase in dosage of Semaglutide.  - Continue her prudent nutritional plan that is low in simple carbohydrates, saturated fats and trans fats to goal of 5-10% weight loss to achieve significant health benefits. - Will continue to monitor condition closely alongside PCP   Vitamin D deficiency Assessment: Condition is almost at goal. Labs were reviewed with patient today and all of the patient's questions about them were answered   Lab Results  Component Value Date  VD25OH 45.6 10/02/2022   VD25OH 46.1 01/12/2022   VD25OH 37.2 10/05/2021  - Labs indicate that her Vitamin D levels are almost within the recommended range of 50-80.  - No issues with Ergocalciferol 50K IU weekly. Denies any GI side effects.   Plan: - Continue with OTC supplement. Will reorder this today.   - Will continue to  monitor levels regularly (every 3-4 mo on average) to keep levels within normal limits and prevent over supplementation.   Other constipation Assessment: Condition is stable. - Patient endorses having one soft bowel movement each day.  - She has been taking Linzess prn and requests a refill.   Plan: - Continue with med. Will refill this today.   - Continue to: Drink at least half of your weight in ounces of water per day unless otherwise noted by one of your doctors that you must restrict  water intake.  Eat plenty of fiber (goal is over 25 grams each day).  It is best to get most of your fiber from dietary sources which includes leafy green vegetables, fresh fruit, and whole grains.   - Will continue to monitor condition closely.    B12 deficiency Assessment: Condition is stable. Lab Results  Component Value Date   VITAMINB12 721 01/12/2022  - No issues with OTC Vitamin B12 1,000 mcg daily. Denies any GI side effects.  Plan: - Continue with supplement. Will reorder today.   - Continue their prudent nutritional plan and focus on b12 rich foods such as lean red meats; poultry; eggs; seafood; beans, peas, and lentils; nuts and seeds; and soy products - We will continue to monitor as deemed clinically necessary.   Other iron deficiency anemia Assessment: Condition is stable. - No issues with Prenatal Vitamins 1 po qd. Denies any adverse effects. - Pt requests a refill.  Plan: - Continue with supplement. Will reorder this today.    - Continue her prudent nutritional plan and continue to advance exercise and cardiovascular fitness as tolerated.  - We will continue to monitor as deemed clinically necessary.   TREATMENT PLAN FOR OBESITY: BMI 45.0-49.9, adult (HCC)-current bmi 45.4 Morbid obesity (HCC)-starting bmi 58.45/date 02/01/21 Assessment:  Karen Brennan is here to discuss her progress with her obesity treatment plan along with follow-up of her obesity related diagnoses. See Medical Weight Management Flowsheet for complete bioelectrical impedance results.  Condition is stable. Biometric data collected today, was reviewed with patient.   Since last office visit on 10/02/22 patient's  Muscle mass has increased by 6.6 lb. Fat mass has decreased by 6.6 lb. Total body water has increased by 5.4 lb.  Counseling done on how various foods will affect these numbers and how to maximize success  Total lbs lost to date: 99 Total weight loss percentage to date: 22.35   Plan:   -  Continue the Category 3 meal plan with breakfast and lunch options.   Behavioral Intervention Additional resources provided today: resistance bands, Band IT exercises, Category 3 meal plan, breakfast options, lunch options, Egg Muffins Handout, Tuna Salad Recipe with [place] [employer]urt.    Evidence-based interventions for health behavior change were utilized today including the discussion of self monitoring techniques, problem-solving barriers and SMART goal setting techniques.   Regarding patient's less desirable eating habits and patterns, we employed the technique of small changes.  Pt will specifically work on:30 minutes, 3 days per week of cardio dancing and doing resistance band exercises 2 sets of 10, 2 days a week  for  next visit.    Recommended Physical Activity Goals  Karen Brennan has been advised to slowly work up to 150 minutes of moderate intensity aerobic activity a week and strengthening exercises 2-3 times per week for cardiovascular health, weight loss maintenance and preservation of muscle mass.   She has agreed to Continue current level of physical activity   FOLLOW UP: Return in 3 weeks (on 12/06/2022). She was informed of the importance of frequent follow up visits to maximize her success with intensive lifestyle modifications for her multiple health conditions.   Subjective:   Chief complaint: Obesity Karen Brennan is here to discuss her progress with her obesity treatment plan. She is on the the Category 3 Plan with breakfast and lunch options and states she is following her eating plan approximately 80% of the time. She states she is walking 30 minutes 3 days per week.  Interval History:  Karen Brennan is here for a follow up office visit.     Since last office visit:   - Pt endorses that sometimes she does not feel hungry and other times she does feel hungry.  - She has been skipping meals sometimes. - Her bowl movements have been stable and no issues with her water consumption.    Pharmacotherapy for weight loss: She is currently taking  Metformin and Semaglutide  for medical weight loss.  Denies side effects.    Review of Systems:  Pertinent positives were addressed with patient today.  Weight Summary and Biometrics   Weight Lost Since Last Visit: 0  Weight Gained Since Last Visit: 0    Vitals Temp: 98.7 F (37.1 C) BP: (!) 151/85 Pulse Rate: 86 SpO2: 100 %   Anthropometric Measurements Height: 6\' 1"  (1.854 m) Weight: (!) 344 lb (156 kg) BMI (Calculated): 45.4 Weight at Last Visit: 344lb Weight Lost Since Last Visit: 0 Weight Gained Since Last Visit: 0 Starting Weight: 443lb Total Weight Loss (lbs): 98 lb (44.5 kg) Peak Weight: 505lb   Body Composition  Body Fat %: 52.5 % Fat Mass (lbs): 180.8 lbs Muscle Mass (lbs): 155.4 lbs Total Body Water (lbs): 117.6 lbs Visceral Fat Rating : 17   Other Clinical Data Fasting: yes Labs: no Today's Visit #: 248 Starting Date: 02/01/21    Objective:   PHYSICAL EXAM: Blood pressure (!) 151/85, pulse 86, temperature 98.7 F (37.1 C), height 6\' 1"  (1.854 m), weight (!) 344 lb (156 kg), SpO2 100 %. Body mass index is 45.39 kg/m.  General: Well Developed, well nourished, and in no acute distress.  HEENT: Normocephalic, atraumatic Skin: Warm and dry, cap RF less 2 sec, good turgor Chest:  Normal excursion, shape, no gross abn Respiratory: speaking in full sentences, no conversational dyspnea NeuroM-Sk: Ambulates w/o assistance, moves * 4 Psych: A and O *3, insight good, mood-full  DIAGNOSTIC DATA REVIEWED:  BMET    Component Value Date/Time   NA 142 09/06/2022 1057   K 4.1 09/06/2022 1057   CL 101 09/06/2022 1057   CO2 25 09/06/2022 1057   GLUCOSE 119 (H) 09/06/2022 1057   GLUCOSE 208 (H) 07/04/2022 1037   BUN 9 09/06/2022 1057   CREATININE 0.78 09/06/2022 1057   CREATININE 0.82 07/05/2016 0942   CALCIUM 9.6 09/06/2022 1057   GFRNONAA >60 07/04/2022 1037   GFRNONAA >89 07/05/2016  0942   GFRAA 99 02/12/2020 1423   GFRAA >89 07/05/2016 0942   Lab Results  Component Value Date   HGBA1C 5.5 10/02/2022   HGBA1C 5.7 09/15/2011  Lab Results  Component Value Date   INSULIN 11.0 01/12/2022   INSULIN 22.7 02/01/2021   Lab Results  Component Value Date   TSH 0.973 10/02/2022   CBC    Component Value Date/Time   WBC 6.6 09/06/2022 1057   WBC 10.4 07/04/2022 1037   RBC 4.78 09/06/2022 1057   RBC 4.76 07/04/2022 1037   HGB 13.9 09/06/2022 1057   HCT 42.8 09/06/2022 1057   PLT 388 09/06/2022 1057   MCV 90 09/06/2022 1057   MCH 29.1 09/06/2022 1057   MCH 29.6 07/04/2022 1037   MCHC 32.5 09/06/2022 1057   MCHC 32.5 07/04/2022 1037   RDW 12.2 09/06/2022 1057   Iron Studies    Component Value Date/Time   IRON 30 06/20/2021 1205   TIBC 444 06/20/2021 1205   FERRITIN 8 (L) 06/20/2021 1205   IRONPCTSAT 7 (LL) 06/20/2021 1205   Lipid Panel     Component Value Date/Time   CHOL 189 12/05/2021 1406   TRIG 119 12/05/2021 1406   HDL 61 12/05/2021 1406   CHOLHDL 3.1 12/05/2021 1406   CHOLHDL 3.8 12/09/2014 1102   VLDL 25 12/09/2014 1102   LDLCALC 107 (H) 12/05/2021 1406   Hepatic Function Panel     Component Value Date/Time   PROT 7.1 12/31/2021 0940   PROT 7.4 10/05/2021 1215   ALBUMIN 3.7 12/31/2021 0940   ALBUMIN 4.3 10/05/2021 1215   AST 23 12/31/2021 0940   ALT 20 12/31/2021 0940   ALKPHOS 50 12/31/2021 0940   BILITOT 0.5 12/31/2021 0940   BILITOT 0.4 10/05/2021 1215   BILIDIR 0.1 02/02/2009 2254   IBILI 0.6 02/02/2009 2254      Component Value Date/Time   TSH 0.973 10/02/2022 1223   Nutritional Lab Results  Component Value Date   VD25OH 45.6 10/02/2022   VD25OH 46.1 01/12/2022   VD25OH 37.2 10/05/2021    Attestations:   Reviewed by clinician on day of visit: allergies, medications, problem list, medical history, surgical history, family history, social history, and previous encounter notes.    I,Special Puri,acting as a Neurosurgeon  for [employer], DO.,have documented all relevant documentation on the behalf of Thomasene Lot, DO,as directed by  Thomasene Lot, DO while in the presence of Thomasene Lot, DO.   I, Thomasene Lot, DO, have reviewed all documentation for this visit. The documentation on 11/15/22 for the exam, diagnosis, procedures, and orders are all accurate and complete.

## 2022-11-16 DIAGNOSIS — R0789 Other chest pain: Secondary | ICD-10-CM | POA: Diagnosis not present

## 2022-11-17 DIAGNOSIS — R0789 Other chest pain: Secondary | ICD-10-CM | POA: Diagnosis not present

## 2022-11-20 DIAGNOSIS — R0789 Other chest pain: Secondary | ICD-10-CM | POA: Diagnosis not present

## 2022-11-21 DIAGNOSIS — R0789 Other chest pain: Secondary | ICD-10-CM | POA: Diagnosis not present

## 2022-11-22 DIAGNOSIS — R0789 Other chest pain: Secondary | ICD-10-CM | POA: Diagnosis not present

## 2022-11-23 DIAGNOSIS — R0789 Other chest pain: Secondary | ICD-10-CM | POA: Diagnosis not present

## 2022-11-24 DIAGNOSIS — R0789 Other chest pain: Secondary | ICD-10-CM | POA: Diagnosis not present

## 2022-11-27 DIAGNOSIS — R0789 Other chest pain: Secondary | ICD-10-CM | POA: Diagnosis not present

## 2022-11-28 DIAGNOSIS — R0789 Other chest pain: Secondary | ICD-10-CM | POA: Diagnosis not present

## 2022-11-29 DIAGNOSIS — R0789 Other chest pain: Secondary | ICD-10-CM | POA: Diagnosis not present

## 2022-11-30 DIAGNOSIS — R0789 Other chest pain: Secondary | ICD-10-CM | POA: Diagnosis not present

## 2022-12-01 DIAGNOSIS — R0789 Other chest pain: Secondary | ICD-10-CM | POA: Diagnosis not present

## 2022-12-04 DIAGNOSIS — R0789 Other chest pain: Secondary | ICD-10-CM | POA: Diagnosis not present

## 2022-12-05 DIAGNOSIS — R0789 Other chest pain: Secondary | ICD-10-CM | POA: Diagnosis not present

## 2022-12-06 ENCOUNTER — Encounter (INDEPENDENT_AMBULATORY_CARE_PROVIDER_SITE_OTHER): Payer: Self-pay | Admitting: Family Medicine

## 2022-12-06 ENCOUNTER — Ambulatory Visit (INDEPENDENT_AMBULATORY_CARE_PROVIDER_SITE_OTHER): Payer: Medicaid Other | Admitting: Family Medicine

## 2022-12-06 VITALS — BP 118/82 | HR 83 | Temp 99.1°F | Ht 73.0 in | Wt 347.0 lb

## 2022-12-06 DIAGNOSIS — Z6841 Body Mass Index (BMI) 40.0 and over, adult: Secondary | ICD-10-CM

## 2022-12-06 DIAGNOSIS — Z7985 Long-term (current) use of injectable non-insulin antidiabetic drugs: Secondary | ICD-10-CM | POA: Diagnosis not present

## 2022-12-06 DIAGNOSIS — K5909 Other constipation: Secondary | ICD-10-CM | POA: Diagnosis not present

## 2022-12-06 DIAGNOSIS — Z7984 Long term (current) use of oral hypoglycemic drugs: Secondary | ICD-10-CM

## 2022-12-06 DIAGNOSIS — D508 Other iron deficiency anemias: Secondary | ICD-10-CM | POA: Diagnosis not present

## 2022-12-06 DIAGNOSIS — E559 Vitamin D deficiency, unspecified: Secondary | ICD-10-CM | POA: Diagnosis not present

## 2022-12-06 DIAGNOSIS — E1169 Type 2 diabetes mellitus with other specified complication: Secondary | ICD-10-CM

## 2022-12-06 DIAGNOSIS — R0789 Other chest pain: Secondary | ICD-10-CM | POA: Diagnosis not present

## 2022-12-06 MED ORDER — METFORMIN HCL 1000 MG PO TABS: 1000.0000 mg | ORAL_TABLET | Freq: Two times a day (BID) | ORAL | 0 refills | Status: DC

## 2022-12-06 MED ORDER — LINACLOTIDE 72 MCG PO CAPS: 72.0000 ug | ORAL_CAPSULE | Freq: Every day | ORAL | 0 refills | Status: DC

## 2022-12-06 MED ORDER — SEMAGLUTIDE (1 MG/DOSE) 4 MG/3ML ~~LOC~~ SOPN
1.0000 mg | PEN_INJECTOR | SUBCUTANEOUS | 0 refills | Status: DC
Start: 2022-12-06 — End: 2022-12-27

## 2022-12-06 MED ORDER — PNV PRENATAL PLUS MULTIVIT+DHA 27-1 & 312 MG PO MISC: ORAL | 0 refills | Status: DC

## 2022-12-06 MED ORDER — VITAMIN D (ERGOCALCIFEROL) 1.25 MG (50000 UNIT) PO CAPS
ORAL_CAPSULE | ORAL | 0 refills | Status: DC
Start: 2022-12-06 — End: 2022-12-27

## 2022-12-06 NOTE — Progress Notes (Signed)
Karen Brennan, D.O.  ABFM, ABOM Specializing in Clinical Bariatric Medicine  Office located at: [address] [place], [place]  [address]     Assessment and Plan:    Medications Discontinued During This Encounter  Medication Reason   Vitamin D, Ergocalciferol, (DRISDOL) 1.25 MG (50000 UNIT) CAPS capsule Reorder   linaclotide (LINZESS) 72 MCG capsule Reorder   metFORMIN (GLUCOPHAGE) 1000 MG tablet Reorder   Prenatal Vit-Fe Fum-FA-Omega (PNV PRENATAL PLUS MULTIVIT+DHA) 27-1 & 312 MG MISC Reorder   Semaglutide, 1 MG/DOSE, 4 MG/3ML SOPN Reorder     Meds ordered this encounter  Medications   linaclotide (LINZESS) 72 MCG capsule    Sig: Take 1 capsule (72 mcg total) by mouth daily before breakfast.    Dispense:  30 capsule    Refill:  0   metFORMIN (GLUCOPHAGE) 1000 MG tablet    Sig: Take 1 tablet (1,000 mg total) by mouth 2 (two) times daily with a meal.    Dispense:  60 tablet    Refill:  0   Semaglutide, 1 MG/DOSE, 4 MG/3ML SOPN    Sig: Inject 1 mg as directed once a week.    Dispense:  3 mL    Refill:  0   Vitamin D, Ergocalciferol, (DRISDOL) 1.25 MG (50000 UNIT) CAPS capsule    Sig: 1 tab q wed and 1 tab q sun    Dispense:  8 capsule    Refill:  0    90 d supply;  ** OV for RF **   Do not send RF request   Prenatal Vit-Fe Fum-FA-Omega (PNV PRENATAL PLUS MULTIVIT+DHA) 27-1 & 312 MG MISC    Sig: 1 po qd    Dispense:  90 each    Refill:  0     Type 2 diabetes mellitus with obesity (HCC) Assessment: Her diabetes mellitus is being treated with Metformin 1,000 mg BID and Semaglutide 1 mg once weekly. She is tolerating both medications well, denies any GI upset. Her hunger and cravings are controlled when following the meal plan.    Lab Results  Component Value Date   HGBA1C 5.5 10/02/2022   HGBA1C 5.7 (H) 06/01/2022   HGBA1C 6.0 (H) 01/12/2022   INSULIN 11.0 01/12/2022   INSULIN 22.7 02/01/2021    Plan: Continue with both antidiabetic medications. Pt  desires to maintain with current dose of Semaglutide. Will refill Semaglutide & Metformin today.     Continue her prudent nutritional plan that is low in simple carbohydrates, saturated fats and trans fats to goal of 5-10% weight loss to achieve significant health benefits.  Pt encouraged to continually advance exercise and cardiovascular fitness as tolerated throughout weight loss journey. Recheck labs in 3 months if not done at Endo provider / PCP. Importance of f/up with PCP and all other specialists, as scheduled, was stressed to the patient today     Other constipation Assessment: Karen Brennan endorses that her constipation symptoms are controlled when taking Linzess 72 mcg. She's been working on increasing her water intake to 1 gallon daily.   Plan:Pt advised to maintain with Linzess at current dose.  Will refill today.   Continue to: Drink at least half of your weight in ounces of water per day unless otherwise noted by one of your doctors that you must restrict water intake.  Eat plenty of fiber (goal is over 25 grams each day).  It is best to get most of your fiber from dietary sources which includes leafy green  vegetables, fresh fruit, and whole grains.  If these changes do not work, contact your PCP.    Vitamin D deficiency Assessment: Condition is not quite at goal. Her Vit D def is being treated with Ergocalciferol 50K IU weekly. She is tolerating supplement well, no concerns.   Lab Results  Component Value Date   VD25OH 45.6 10/02/2022   VD25OH 46.1 01/12/2022   VD25OH 37.2 10/05/2021   Plan: Pt agrees to continue with prescribed supplement at current dose. Will refill ERGO today.   Weight loss will likely improve availability of vitamin D, thus encouraged Karen Brennan to continue with meal plan and their weight loss efforts to further improve this condition.  Thus, we will need to monitor levels regularly (every 3-4 mo on average) to keep levels within normal limits and prevent over  supplementation.   Other iron deficiency anemia Assessment: Condition is being treated with Prenatal Vitamins 1 po every day. She is tolerating the vitamins well, no issues.   Plan: Continue with supplement. Will refill  today.    Iron-rich foods include dark leafy greens, red and white meats, eggs, seafood, and beans   Certain foods and drinks prevent your body from absorbing iron properly.  Avoid eating these foods in the same meal as iron-rich foods or with iron supplements.  These foods include: coffee, black tea, and red wine; milk, dairy products, and foods that are high in calcium; beans and soybeans; whole grains. Will continue to monitor condition as deemed clinically necessary.   TREATMENT PLAN FOR OBESITY: BMI 45.0-49.9, adult (HCC)-current bmi 45.79 Morbid obesity (HCC)-starting bmi 58.45/date 02/01/21 Assessment: Karen Brennan is here to discuss her progress with her obesity treatment plan along with follow-up of her obesity related diagnoses. See Medical Weight Management Flowsheet for complete bioelectrical impedance results.  Condition is not optimized.  Biometric data collected today, was reviewed with patient.   Since last office visit on 11/15/22 patient's  Muscle mass has increased by 3.6 lb. Fat mass has not changed. Total body water has decreased by 10 lb.  Counseling done on how various foods will affect these numbers and how to maximize success  Total lbs lost to date: 96 Total weight loss percentage to date: 21.67   Plan: Continue the Category 3 meal plan with breakfast and lunch options.   - I encouraged Karen Brennan to explore the [employer] App, which can be used to located food pantries near her home.   Behavioral Intervention Additional resources provided today:  [employer] Evidence-based interventions for health behavior change were utilized today including the discussion of self monitoring techniques, problem-solving barriers and SMART goal  setting techniques.   Regarding patient's less desirable eating habits and patterns, we employed the technique of small changes.  Pt will specifically work on: continue with prescribed meal plan/exercise regiment, decrease salt intake, and increase water intake for next visit.    Recommended Physical Activity Goals  Karen Brennan has been advised to slowly work up to 150 minutes of moderate intensity aerobic activity a week and strengthening exercises 2-3 times per week for cardiovascular health, weight loss maintenance and preservation of muscle mass.   She has agreed to Continue current level of physical activity   FOLLOW UP: Return in about 3 weeks (around 12/27/2022). She was informed of the importance of frequent follow up visits to maximize her success with intensive lifestyle modifications for her multiple health conditions.  Subjective:   Chief complaint: Obesity Karen Brennan is here to discuss  her progress with her obesity treatment plan. She is on the the Category 3 Plan with breakfast and lunch options and states she is following her eating plan approximately 80% of the time. She states she is exercising (chair, weight, and walking) 30 minutes 3 days per week.  Interval History:  Karen Brennan is here for a follow up office visit.   Since last OV, N[place]ia has been doing well. She endorses not feeling hungry/not eating in the mornings, and then overeating when she gets hungry later in the day. She endorses that recently it has been a challenge financially to buy the healthy nutritious foods on her meal plan. She is working towards drinking 1 gallon of water daily. When following the meal plan, her hunger and cravings are pretty well controlled.   Pharmacotherapy for weight loss: She is currently taking  Metformin and Semaglutide  for medical weight loss.  Denies side effects.    Review of Systems:  Pertinent positives were addressed with patient today.  Reviewed by clinician on day of visit:  allergies, medications, problem list, medical history, surgical history, family history, social history, and previous encounter notes.  Weight Summary and Biometrics   Weight Lost Since Last Visit: 0  Weight Gained Since Last Visit: 3lb    Vitals Temp: 99.1 F (37.3 C) BP: 118/82 Pulse Rate: 83 SpO2: 98 %   Anthropometric Measurements Height: 6\' 1"  (1.854 m) Weight: (!) 347 lb (157.4 kg) BMI (Calculated): 45.79 Weight at Last Visit: 344lb Weight Lost Since Last Visit: 0 Weight Gained Since Last Visit: 3lb Starting Weight: 443lb Total Weight Loss (lbs): 95 lb (43.1 kg) Peak Weight: 505lb   Body Composition  Body Fat %: 52.1 % Fat Mass (lbs): 180.8 lbs Muscle Mass (lbs): 158 lbs Total Body Water (lbs): 127.6 lbs Visceral Fat Rating : 17   Other Clinical Data Fasting: no Labs: no Today's Visit #: 29 Starting Date: 02/01/21   Objective:   PHYSICAL EXAM: Blood pressure 118/82, pulse 83, temperature 99.1 F (37.3 C), height 6\' 1"  (1.854 m), weight (!) 347 lb (157.4 kg), SpO2 98 %. Body mass index is 45.78 kg/m.  General: Well Developed, well nourished, and in no acute distress.  HEENT: Normocephalic, atraumatic Skin: Warm and dry, cap RF less 2 sec, good turgor Chest:  Normal excursion, shape, no gross abn Respiratory: speaking in full sentences, no conversational dyspnea NeuroM-Sk: Ambulates w/o assistance, moves * 4 Psych: A and O *3, insight good, mood-full  DIAGNOSTIC DATA REVIEWED:  BMET    Component Value Date/Time   NA 142 09/06/2022 1057   K 4.1 09/06/2022 1057   CL 101 09/06/2022 1057   CO2 25 09/06/2022 1057   GLUCOSE 119 (H) 09/06/2022 1057   GLUCOSE 208 (H) 07/04/2022 1037   BUN 9 09/06/2022 1057   CREATININE 0.78 09/06/2022 1057   CREATININE 0.82 07/05/2016 0942   CALCIUM 9.6 09/06/2022 1057   GFRNONAA >60 07/04/2022 1037   GFRNONAA >89 07/05/2016 0942   GFRAA 99 02/12/2020 1423   GFRAA >89 07/05/2016 0942   Lab Results   Component Value Date   HGBA1C 5.5 10/02/2022   HGBA1C 5.7 09/15/2011   Lab Results  Component Value Date   INSULIN 11.0 01/12/2022   INSULIN 22.7 02/01/2021   Lab Results  Component Value Date   TSH 0.973 10/02/2022   CBC    Component Value Date/Time   WBC 6.6 09/06/2022 1057   WBC 10.4 07/04/2022 1037   RBC 4.78 09/06/2022 1057  RBC 4.76 07/04/2022 1037   HGB 13.9 09/06/2022 1057   HCT 42.8 09/06/2022 1057   PLT 388 09/06/2022 1057   MCV 90 09/06/2022 1057   MCH 29.1 09/06/2022 1057   MCH 29.6 07/04/2022 1037   MCHC 32.5 09/06/2022 1057   MCHC 32.5 07/04/2022 1037   RDW 12.2 09/06/2022 1057   Iron Studies    Component Value Date/Time   IRON 30 06/20/2021 1205   TIBC 444 06/20/2021 1205   FERRITIN 8 (L) 06/20/2021 1205   IRONPCTSAT 7 (LL) 06/20/2021 1205   Lipid Panel     Component Value Date/Time   CHOL 189 12/05/2021 1406   TRIG 119 12/05/2021 1406   HDL 61 12/05/2021 1406   CHOLHDL 3.1 12/05/2021 1406   CHOLHDL 3.8 12/09/2014 1102   VLDL 25 12/09/2014 1102   LDLCALC 107 (H) 12/05/2021 1406   Hepatic Function Panel     Component Value Date/Time   PROT 7.1 12/31/2021 0940   PROT 7.4 10/05/2021 1215   ALBUMIN 3.7 12/31/2021 0940   ALBUMIN 4.3 10/05/2021 1215   AST 23 12/31/2021 0940   ALT 20 12/31/2021 0940   ALKPHOS 50 12/31/2021 0940   BILITOT 0.5 12/31/2021 0940   BILITOT 0.4 10/05/2021 1215   BILIDIR 0.1 02/02/2009 2254   IBILI 0.6 02/02/2009 2254      Component Value Date/Time   TSH 0.973 10/02/2022 1223   Nutritional Lab Results  Component Value Date   VD25OH 45.6 10/02/2022   VD25OH 46.1 01/12/2022   VD25OH 37.2 10/05/2021    Attestations:   I, Special Puri, acting as a Stage manager for [employer], DO., have compiled all relevant documentation for today's office visit on behalf of Thomasene Lot, DO, while in the presence of [employer], DO.  I have reviewed the above documentation for accuracy and completeness, and  I agree with the above. Karen Brennan, D.O.  The 21st Century Cures Act was signed into law in 2016 which includes the topic of electronic health records.  This provides immediate access to information in MyChart.  This includes consultation notes, operative notes, office notes, lab results and pathology reports.  If you have any questions about what you read please let us know at your next visit so we can discuss your concerns and take corrective action if need be.  We are right here with you.

## 2022-12-07 ENCOUNTER — Other Ambulatory Visit: Payer: Self-pay | Admitting: Family Medicine

## 2022-12-07 ENCOUNTER — Other Ambulatory Visit: Payer: Self-pay

## 2022-12-07 DIAGNOSIS — R0789 Other chest pain: Secondary | ICD-10-CM | POA: Diagnosis not present

## 2022-12-07 DIAGNOSIS — J302 Other seasonal allergic rhinitis: Secondary | ICD-10-CM

## 2022-12-07 MED ORDER — FLUTICASONE PROPIONATE 50 MCG/ACT NA SUSP: NASAL | 0 refills | Status: DC

## 2022-12-08 DIAGNOSIS — R0789 Other chest pain: Secondary | ICD-10-CM | POA: Diagnosis not present

## 2022-12-11 DIAGNOSIS — R0789 Other chest pain: Secondary | ICD-10-CM | POA: Diagnosis not present

## 2022-12-12 DIAGNOSIS — R0789 Other chest pain: Secondary | ICD-10-CM | POA: Diagnosis not present

## 2022-12-13 DIAGNOSIS — R0789 Other chest pain: Secondary | ICD-10-CM | POA: Diagnosis not present

## 2022-12-14 DIAGNOSIS — R0789 Other chest pain: Secondary | ICD-10-CM | POA: Diagnosis not present

## 2022-12-26 ENCOUNTER — Ambulatory Visit: Payer: Medicaid Other | Admitting: Family Medicine

## 2022-12-27 ENCOUNTER — Encounter (INDEPENDENT_AMBULATORY_CARE_PROVIDER_SITE_OTHER): Payer: Self-pay | Admitting: Family Medicine

## 2022-12-27 ENCOUNTER — Ambulatory Visit (INDEPENDENT_AMBULATORY_CARE_PROVIDER_SITE_OTHER): Payer: MEDICAID | Admitting: Family Medicine

## 2022-12-27 VITALS — BP 138/89 | HR 100 | Temp 98.6°F | Ht 73.0 in | Wt 351.0 lb

## 2022-12-27 DIAGNOSIS — D508 Other iron deficiency anemias: Secondary | ICD-10-CM | POA: Diagnosis not present

## 2022-12-27 DIAGNOSIS — K5909 Other constipation: Secondary | ICD-10-CM

## 2022-12-27 DIAGNOSIS — Z6841 Body Mass Index (BMI) 40.0 and over, adult: Secondary | ICD-10-CM

## 2022-12-27 DIAGNOSIS — E559 Vitamin D deficiency, unspecified: Secondary | ICD-10-CM | POA: Diagnosis not present

## 2022-12-27 DIAGNOSIS — E1169 Type 2 diabetes mellitus with other specified complication: Secondary | ICD-10-CM | POA: Diagnosis not present

## 2022-12-27 DIAGNOSIS — E119 Type 2 diabetes mellitus without complications: Secondary | ICD-10-CM

## 2022-12-27 DIAGNOSIS — Z7985 Long-term (current) use of injectable non-insulin antidiabetic drugs: Secondary | ICD-10-CM

## 2022-12-27 DIAGNOSIS — Z7984 Long term (current) use of oral hypoglycemic drugs: Secondary | ICD-10-CM

## 2022-12-27 MED ORDER — PNV PRENATAL PLUS MULTIVIT+DHA 27-1 & 312 MG PO MISC: ORAL | 0 refills | Status: DC

## 2022-12-27 MED ORDER — VITAMIN D (ERGOCALCIFEROL) 1.25 MG (50000 UNIT) PO CAPS
ORAL_CAPSULE | ORAL | 0 refills | Status: DC
Start: 2022-12-27 — End: 2023-01-18

## 2022-12-27 MED ORDER — LINACLOTIDE 72 MCG PO CAPS: 72.0000 ug | ORAL_CAPSULE | Freq: Every day | ORAL | 0 refills | Status: DC

## 2022-12-27 MED ORDER — SEMAGLUTIDE (1 MG/DOSE) 4 MG/3ML ~~LOC~~ SOPN
1.0000 mg | PEN_INJECTOR | SUBCUTANEOUS | 0 refills | Status: DC
Start: 2022-12-27 — End: 2023-01-18

## 2022-12-27 NOTE — Progress Notes (Signed)
Karen Brennan, D.O.  ABFM, ABOM Specializing in Clinical Bariatric Medicine  Office located at: [address] [place], [place]  [address]     Assessment and Plan:   Medications Discontinued During This Encounter  Medication Reason   linaclotide (LINZESS) 72 MCG capsule Reorder   Semaglutide, 1 MG/DOSE, 4 MG/3ML SOPN Reorder   Vitamin D, Ergocalciferol, (DRISDOL) 1.25 MG (50000 UNIT) CAPS capsule Reorder   Prenatal Vit-Fe Fum-FA-Omega (PNV PRENATAL PLUS MULTIVIT+DHA) 27-1 & 312 MG MISC Reorder    Meds ordered this encounter  Medications   linaclotide (LINZESS) 72 MCG capsule    Sig: Take 1 capsule (72 mcg total) by mouth daily before breakfast.    Dispense:  30 capsule    Refill:  0   Prenatal Vit-Fe Fum-FA-Omega (PNV PRENATAL PLUS MULTIVIT+DHA) 27-1 & 312 MG MISC    Sig: 1 po qd    Dispense:  90 each    Refill:  0   Semaglutide, 1 MG/DOSE, 4 MG/3ML SOPN    Sig: Inject 1 mg as directed once a week.    Dispense:  3 mL    Refill:  0   Vitamin D, Ergocalciferol, (DRISDOL) 1.25 MG (50000 UNIT) CAPS capsule    Sig: 1 tab q wed and 1 tab q sun    Dispense:  8 capsule    Refill:  0    90 d supply;  ** OV for RF **   Do not send RF request    Other constipation Assessment: Condition is Controlled. She continues to increase fiber in her diet and continues Linzess 72 mcg daily and is tolerating this well without any adverse side effects.   Plan: She is to continue Linzess at her current does and I will refill today.    Type 2 diabetes mellitus with obesity (HCC) Assessment: Condition is Controlled. She continues Metformin without any GI upset and Semaglutide without any adverse effects. She is tolerating both well. Her hunger and cravings are less controlled as she eats out of boredom.  Lab Results  Component Value Date   HGBA1C 5.5 10/02/2022   HGBA1C 5.7 (H) 06/01/2022   HGBA1C 6.0 (H) 01/12/2022   INSULIN 11.0 01/12/2022   INSULIN 22.7 02/01/2021   Plan: She  will continue Metformin 1,000 mg BID and Semaglutide 1 mg once weekly. She denies need for a dose increase of the Semaglutide at this moment. I will refill both today. Reminded Karen Brennan that lifestyle changes are the first line treatment option for most all disease processes.  Education provided that "food is medicine" and I encouraged her to be mindful of how certain foods can improve or worsen her medical conditions.   - Recheck labs in 3 months if not done at Endo provider / PCP.  - Importance of f/up with PCP and all other specialists, as scheduled, was stressed to the patient today    Vitamin D deficiency Assessment: Condition is Not at goal. Her vitamin D level is not within the optimal range of 50-80. She continues Ergocalciferol 50K IU weekly and is tolerating this well with no significant difficulties.  Lab Results  Component Value Date   VD25OH 45.6 10/02/2022   VD25OH 46.1 01/12/2022   VD25OH 37.2 10/05/2021   Plan: She will continue ERGO as directed by Dr. Val Eagle. Will refill today.  - weight loss will likely improve availability of vitamin D, we will need to monitor levels regularly (every 3-4 mo on average) to keep levels  within normal limits and prevent over supplementation.   Other iron deficiency anemia Assessment: Condition is being treated with prenatal vitamin containing iron supplement.  Lab Results  Component Value Date   IRON 30 06/20/2021   TIBC 444 06/20/2021   FERRITIN 8 (L) 06/20/2021   Plan: She is to continue with her Prenatal Vitamins 1 po every day.  Will refill  today.    TREATMENT PLAN FOR OBESITY: BMI 50.0-59.9, adult (HCC)- current bmi 45.79 Morbid obesity (HCC)-starting bmi 58.45/date 02/01/21 Assessment:  Karen Brennan is here to discuss her progress with her obesity treatment plan along with follow-up of her obesity related diagnoses. See Medical Weight Management Flowsheet for complete bioelectrical impedance results.  Condition is docourse:  improving. Biometric data collected today, was reviewed with patient.   Since last office visit on 12/06/2022 patient's  Muscle mass has increased by 4lb. Fat mass has decreased by 0.2lb. Total body water has decreased by 3.2lb.  Counseling done on how various foods will affect these numbers and how to maximize success  Total lbs lost to date: 92lbs Total weight loss percentage to date: 20.77%  Plan: Continue to adhere to the prescribed Category 3 meal plan with breakfast and lunch options.   Behavioral Intervention Additional resources provided today: patient declined Evidence-based interventions for health behavior change were utilized today including the discussion of self monitoring techniques, problem-solving barriers and SMART goal setting techniques.   Regarding patient's less desirable eating habits and patterns, we employed the technique of small changes.  Pt will specifically work on: increase physical activity for next visit.    Recommended Physical Activity Goals  Karen Brennan has been advised to slowly work up to 150 minutes of moderate intensity aerobic activity a week and strengthening exercises 2-3 times per week for cardiovascular health, weight loss maintenance and preservation of muscle mass.   She has agreed to Increase physical activity in their day and reduce sedentary time (increase NEAT).  FOLLOW UP: Return in about 3 weeks (around 01/17/2023). She was informed of the importance of frequent follow up visits to maximize her success with intensive lifestyle modifications for her multiple health conditions.  Subjective:   Chief complaint: Obesity Karen Brennan is here to discuss her progress with her obesity treatment plan. She is on the the Category 3 Plan with B and L options and states she is following her eating plan approximately 60% of the time. She states she is exercising 30 minutes 2 days per week.  Interval History:  Karen Brennan is here for a follow up office visit.   Since last OV pt states that she has fell off her prescribed meal plan due to multiple grandkids birthday parties. She states that after coming home she continued to eat off plan and snacked occasionally. She informed me that she has been eating out of boredom and has skipped meals occasionally.   Pharmacotherapy for weight loss: She is currently taking  Metformin and Semaglutide  for medical weight loss.  Denies side effects.    Review of Systems:  Pertinent positives were addressed with patient today.  Reviewed by clinician on day of visit: allergies, medications, problem list, medical history, surgical history, family history, social history, and previous encounter notes.  Weight Summary and Biometrics   Weight Lost Since Last Visit: 0lb  Weight Gained Since Last Visit: 4  Vitals Temp: 98.6 F (37 C) BP: 138/89 Pulse Rate: 100 SpO2: 98 %   Anthropometric Measurements Height: 6\' 1"  (1.854 m) Weight: Karen Brennan)  351 lb (159.2 kg) BMI (Calculated): 46.32 Weight at Last Visit: 347lb Weight Lost Since Last Visit: 0lb Weight Gained Since Last Visit: 4 Starting Weight: 443lb Total Weight Loss (lbs): 91 lb (41.3 kg) Peak Weight: 505lb   Body Composition  Body Fat %: 51.4 % Fat Mass (lbs): 180.6 lbs Muscle Mass (lbs): 162 lbs Total Body Water (lbs): 124.4 lbs Visceral Fat Rating : 17   Other Clinical Data Fasting: no Labs: no Today's Visit #: 30 Starting Date: 02/01/21   Objective:   PHYSICAL EXAM: Blood pressure 138/89, pulse 100, temperature 98.6 F (37 C), height 6\' 1"  (1.854 m), weight (!) 351 lb (159.2 kg), SpO2 98%. Body mass index is 46.31 kg/m.  General: Well Developed, well nourished, and in no acute distress.  HEENT: Normocephalic, atraumatic Skin: Warm and dry, cap RF less 2 sec, good turgor Chest:  Normal excursion, shape, no gross abn Respiratory: speaking in full sentences, no conversational dyspnea NeuroM-Sk: Ambulates w/o assistance, moves * 4 Psych:  A and O *3, insight good, mood-full  DIAGNOSTIC DATA REVIEWED:  BMET    Component Value Date/Time   NA 142 09/06/2022 1057   K 4.1 09/06/2022 1057   CL 101 09/06/2022 1057   CO2 25 09/06/2022 1057   GLUCOSE 119 (H) 09/06/2022 1057   GLUCOSE 208 (H) 07/04/2022 1037   BUN 9 09/06/2022 1057   CREATININE 0.78 09/06/2022 1057   CREATININE 0.82 07/05/2016 0942   CALCIUM 9.6 09/06/2022 1057   GFRNONAA >60 07/04/2022 1037   GFRNONAA >89 07/05/2016 0942   GFRAA 99 02/12/2020 1423   GFRAA >89 07/05/2016 0942   Lab Results  Component Value Date   HGBA1C 5.5 10/02/2022   HGBA1C 5.7 09/15/2011   Lab Results  Component Value Date   INSULIN 11.0 01/12/2022   INSULIN 22.7 02/01/2021   Lab Results  Component Value Date   TSH 0.973 10/02/2022   CBC    Component Value Date/Time   WBC 6.6 09/06/2022 1057   WBC 10.4 07/04/2022 1037   RBC 4.78 09/06/2022 1057   RBC 4.76 07/04/2022 1037   HGB 13.9 09/06/2022 1057   HCT 42.8 09/06/2022 1057   PLT 388 09/06/2022 1057   MCV 90 09/06/2022 1057   MCH 29.1 09/06/2022 1057   MCH 29.6 07/04/2022 1037   MCHC 32.5 09/06/2022 1057   MCHC 32.5 07/04/2022 1037   RDW 12.2 09/06/2022 1057   Iron Studies    Component Value Date/Time   IRON 30 06/20/2021 1205   TIBC 444 06/20/2021 1205   FERRITIN 8 (L) 06/20/2021 1205   IRONPCTSAT 7 (LL) 06/20/2021 1205   Lipid Panel     Component Value Date/Time   CHOL 189 12/05/2021 1406   TRIG 119 12/05/2021 1406   HDL 61 12/05/2021 1406   CHOLHDL 3.1 12/05/2021 1406   CHOLHDL 3.8 12/09/2014 1102   VLDL 25 12/09/2014 1102   LDLCALC 107 (H) 12/05/2021 1406   Hepatic Function Panel     Component Value Date/Time   PROT 7.1 12/31/2021 0940   PROT 7.4 10/05/2021 1215   ALBUMIN 3.7 12/31/2021 0940   ALBUMIN 4.3 10/05/2021 1215   AST 23 12/31/2021 0940   ALT 20 12/31/2021 0940   ALKPHOS 50 12/31/2021 0940   BILITOT 0.5 12/31/2021 0940   BILITOT 0.4 10/05/2021 1215   BILIDIR 0.1 02/02/2009 2254    IBILI 0.6 02/02/2009 2254      Component Value Date/Time   TSH 0.973 10/02/2022 1223   Nutritional Lab  Results  Component Value Date   VD25OH 45.6 10/02/2022   VD25OH 46.1 01/12/2022   VD25OH 37.2 10/05/2021    Attestations:   I, Clinical biochemist, acting as a Stage manager for [employer], DO., have compiled all relevant documentation for today's office visit on behalf of Karen Lot, DO, while in the presence of [employer], DO.  I have reviewed the above documentation for accuracy and completeness, and I agree with the above. Karen Brennan, D.O.  The 21st Century Cures Act was signed into law in 2016 which includes the topic of electronic health records.  This provides immediate access to information in MyChart.  This includes consultation notes, operative notes, office notes, lab results and pathology reports.  If you have any questions about what you read please let us know at your next visit so we can discuss your concerns and take corrective action if need be.  We are right here with you.

## 2022-12-28 DIAGNOSIS — F250 Schizoaffective disorder, bipolar type: Secondary | ICD-10-CM | POA: Diagnosis not present

## 2023-01-09 DIAGNOSIS — F250 Schizoaffective disorder, bipolar type: Secondary | ICD-10-CM | POA: Diagnosis not present

## 2023-01-12 DIAGNOSIS — F250 Schizoaffective disorder, bipolar type: Secondary | ICD-10-CM | POA: Diagnosis not present

## 2023-01-18 ENCOUNTER — Encounter (INDEPENDENT_AMBULATORY_CARE_PROVIDER_SITE_OTHER): Payer: Self-pay | Admitting: Family Medicine

## 2023-01-18 ENCOUNTER — Ambulatory Visit (INDEPENDENT_AMBULATORY_CARE_PROVIDER_SITE_OTHER): Payer: MEDICAID | Admitting: Family Medicine

## 2023-01-18 DIAGNOSIS — E1169 Type 2 diabetes mellitus with other specified complication: Secondary | ICD-10-CM | POA: Diagnosis not present

## 2023-01-18 DIAGNOSIS — E559 Vitamin D deficiency, unspecified: Secondary | ICD-10-CM | POA: Diagnosis not present

## 2023-01-18 DIAGNOSIS — K5909 Other constipation: Secondary | ICD-10-CM | POA: Diagnosis not present

## 2023-01-18 DIAGNOSIS — Z7985 Long-term (current) use of injectable non-insulin antidiabetic drugs: Secondary | ICD-10-CM

## 2023-01-18 DIAGNOSIS — Z7984 Long term (current) use of oral hypoglycemic drugs: Secondary | ICD-10-CM

## 2023-01-18 DIAGNOSIS — Z6841 Body Mass Index (BMI) 40.0 and over, adult: Secondary | ICD-10-CM

## 2023-01-18 DIAGNOSIS — D508 Other iron deficiency anemias: Secondary | ICD-10-CM

## 2023-01-18 MED ORDER — SEMAGLUTIDE (1 MG/DOSE) 4 MG/3ML ~~LOC~~ SOPN
1.0000 mg | PEN_INJECTOR | SUBCUTANEOUS | 0 refills | Status: DC
Start: 2023-01-18 — End: 2023-02-26

## 2023-01-18 MED ORDER — VITAMIN B-12 1000 MCG PO TABS: 1000.0000 ug | ORAL_TABLET | Freq: Every day | ORAL | Status: DC

## 2023-01-18 MED ORDER — PNV PRENATAL PLUS MULTIVIT+DHA 27-1 & 312 MG PO MISC
ORAL | 0 refills | Status: DC
Start: 2023-01-18 — End: 2023-04-11

## 2023-01-18 MED ORDER — LINACLOTIDE 72 MCG PO CAPS
72.0000 ug | ORAL_CAPSULE | Freq: Every day | ORAL | 0 refills | Status: AC
Start: 2023-01-18 — End: ?

## 2023-01-18 MED ORDER — METFORMIN HCL 1000 MG PO TABS
1000.0000 mg | ORAL_TABLET | Freq: Two times a day (BID) | ORAL | 0 refills | Status: AC
Start: 2023-01-18 — End: ?

## 2023-01-18 MED ORDER — VITAMIN D (ERGOCALCIFEROL) 1.25 MG (50000 UNIT) PO CAPS
ORAL_CAPSULE | ORAL | 0 refills | Status: DC
Start: 2023-01-18 — End: 2023-04-11

## 2023-01-18 NOTE — Progress Notes (Signed)
Karen Brennan, D.O.  ABFM, ABOM Specializing in Clinical Bariatric Medicine  Office located at: [address] [place], [place]  [address]     Assessment and Plan:   Medications Discontinued During This Encounter  Medication Reason   cyanocobalamin (VITAMIN B12) 1000 MCG tablet Reorder   metFORMIN (GLUCOPHAGE) 1000 MG tablet Reorder   linaclotide (LINZESS) 72 MCG capsule Reorder   Prenatal Vit-Fe Fum-FA-Omega (PNV PRENATAL PLUS MULTIVIT+DHA) 27-1 & 312 MG MISC Reorder   Semaglutide, 1 MG/DOSE, 4 MG/3ML SOPN Reorder   Vitamin D, Ergocalciferol, (DRISDOL) 1.25 MG (50000 UNIT) CAPS capsule Reorder    Meds ordered this encounter  Medications   Vitamin D, Ergocalciferol, (DRISDOL) 1.25 MG (50000 UNIT) CAPS capsule    Sig: 1 tab q wed and 1 tab q sun    Dispense:  8 capsule    Refill:  0    90 d supply;  ** OV for RF **   Do not send RF request   Semaglutide, 1 MG/DOSE, 4 MG/3ML SOPN    Sig: Inject 1 mg as directed once a week.    Dispense:  3 mL    Refill:  0   Prenatal Vit-Fe Fum-FA-Omega (PNV PRENATAL PLUS MULTIVIT+DHA) 27-1 & 312 MG MISC    Sig: 1 po qd    Dispense:  90 each    Refill:  0   metFORMIN (GLUCOPHAGE) 1000 MG tablet    Sig: Take 1 tablet (1,000 mg total) by mouth 2 (two) times daily with a meal.    Dispense:  60 tablet    Refill:  0   linaclotide (LINZESS) 72 MCG capsule    Sig: Take 1 capsule (72 mcg total) by mouth daily before breakfast.    Dispense:  30 capsule    Refill:  0   cyanocobalamin (VITAMIN B12) 1000 MCG tablet    Sig: Take 1 tablet (1,000 mcg total) by mouth daily.     Vitamin D deficiency Assessment: Condition is Not at goal.. Pt has been complaint with Ergocalciferol and tolerates this well. She denies any adverse side effects at this time.  Lab Results  Component Value Date   VD25OH 45.6 10/02/2022   VD25OH 46.1 01/12/2022   VD25OH 37.2 10/05/2021   Plan:- Continue Ergocalciferol 50K IU weekly. I will refill today.   -  Informed patient this may be a lifelong thing, and she was encouraged to continue to take the medicine until told otherwise.     - We will need to monitor levels regularly every 3-4 mo on average to keep levels within normal limits and prevent over supplementation. Will adjust treatment as deemed clinically necessary.    Type 2 diabetes mellitus with obesity (HCC) Assessment: Condition is Not at goal..  Her A1c is within normal range and her insulin level is elevated. Pt has been complaint with taking Metformin 1000mg  BID and Semaglutide 1mg  once weekly. She denies any I upset, N/V/D, and any adverse side effects.  Lab Results  Component Value Date   HGBA1C 5.5 10/02/2022   HGBA1C 5.7 (H) 06/01/2022   HGBA1C 6.0 (H) 01/12/2022   INSULIN 11.0 01/12/2022   INSULIN 22.7 02/01/2021    Plan: - Continue with Metformin and Semaglutide at current dose. I will refill today.   - I gave her counseling about decreasing simple carbs as they can make her nauseated on specific medications.   - We extensively discussed the importance of decreasing simple carbs, increasing proteins and how certain  foods they eat will affect their blood sugars  - Recommend that any concerns about medicines should be directed at the prescribing provider.   - Recheck labs in 3 months if not done at Endo provider / PCP.    Other constipation Assessment: Condition is Controlled. Pt is complaint with Linzess and tolerates this without difficulty.   Plan: - Continue Linzess daily. I will refill today.   - Continue to increase fiber in her diet to help alleviate constipation.   - Unless pre-existing renal or cardiopulmonary conditions exist which patient was told to limit their fluid intake by another provider, I recommended roughly one half of their weight in pounds, to be the approximate ounces of non-caloric, non-caffeinated beverages they should drink per day; including more if they are engaging in  exercise.   TREATMENT PLAN FOR OBESITY: BMI 50.0-59.9, adult (HCC)- current bmi 44.6 Morbid obesity (HCC)-starting bmi 58.45/date 02/01/21 Assessment:  Karen Brennan is here to discuss her progress with her obesity treatment plan along with follow-up of her obesity related diagnoses. See Medical Weight Management Flowsheet for complete bioelectrical impedance results.  Condition is not optimized. Biometric data collected today, was reviewed with patient.   Since last office visit on 12/27/2022 patient's  Muscle mass has decreased by 12.6lb. Fat mass has increased by 0.6lb. Total body water has decreased by 16.2lb.  Counseling done on how various foods will affect these numbers and how to maximize success  Total lbs lost to date: 105 Total weight loss percentage to date: 23.70%  Plan: - Continue to follow the Category 3 meal plan with B and L options.   Behavioral Intervention Additional resources provided today: patient declined Evidence-based interventions for health behavior change were utilized today including the discussion of self monitoring techniques, problem-solving barriers and SMART goal setting techniques.   Regarding patient's less desirable eating habits and patterns, we employed the technique of small changes.  Pt will specifically work on: 30-64mins of walking for next visit.    Recommended Physical Activity Goals  [name] has been advised to slowly work up to 150 minutes of moderate intensity aerobic activity a week and strengthening exercises 2-3 times per week for cardiovascular health, weight loss maintenance and preservation of muscle mass.   She has agreed to Increase physical activity in their day and reduce sedentary time (increase NEAT).   FOLLOW UP: Return in about 3 weeks (around 02/08/2023).  She was informed of the importance of frequent follow up visits to maximize her success with intensive lifestyle modifications for her multiple health  conditions.  Subjective:   Chief complaint: Obesity Karen Brennan is here to discuss her progress with her obesity treatment plan. She is on the the Category 3 Plan with breakfast and lunch options and states she is following her eating plan approximately 80% of the time. She states she is exercising 30 minutes 3-4 days per week.  Interval History:  Karen Brennan is here for a follow up office visit.     Since last OV pt has been doing well and has looked into a food pantry to obtain healthier financial free foods. Pt notes that she is not drinking as much water as she needs to be. She has been feeling "off" since yesterday, pt notes having a headache and being nauseated since the day before.    Pharmacotherapy for weight loss: She is currently taking  Metformin and Semaglutide  for medical weight loss.  Denies side effects.    Review of  Systems:  Pertinent positives were addressed with patient today.   Reviewed by clinician on day of visit: allergies, medications, problem list, medical history, surgical history, family history, social history, and previous encounter notes.  Weight Summary and Biometrics   Weight Lost Since Last Visit: 13lb  Weight Gained Since Last Visit: 0lb   Vitals Temp: 99.3 F (37.4 C) BP: 115/62 Pulse Rate: 95 SpO2: 98 %   Anthropometric Measurements Height: 6\' 1"  (1.854 m) Weight: (!) 338 lb (153.3 kg) BMI (Calculated): 44.6 Weight at Last Visit: 351lb Weight Lost Since Last Visit: 13lb Weight Gained Since Last Visit: 0lb Starting Weight: 443lb Total Weight Loss (lbs): 105 lb (47.6 kg) Peak Weight: 505lb   Body Composition  Body Fat %: 53.5 % Fat Mass (lbs): 181.2 lbs Muscle Mass (lbs): 149.4 lbs Total Body Water (lbs): 107.8 lbs Visceral Fat Rating : 17   Other Clinical Data Fasting: no Labs: no Today's Visit #: 31 Starting Date: 02/01/21     Objective:   PHYSICAL EXAM: Blood pressure 115/62, pulse 95, temperature 99.3 F (37.4  C), height 6\' 1"  (1.854 m), weight (!) 338 lb (153.3 kg), SpO2 98%. Body mass index is 44.59 kg/m.  General: Well Developed, well nourished, and in no acute distress.  HEENT: Normocephalic, atraumatic Skin: Warm and dry, cap RF less 2 sec, good turgor Chest:  Normal excursion, shape, no gross abn Respiratory: speaking in full sentences, no conversational dyspnea NeuroM-Sk: Ambulates w/o assistance, moves * 4 Psych: A and O *3, insight good, mood-full  DIAGNOSTIC DATA REVIEWED:  BMET    Component Value Date/Time   NA 142 09/06/2022 1057   K 4.1 09/06/2022 1057   CL 101 09/06/2022 1057   CO2 25 09/06/2022 1057   GLUCOSE 119 (H) 09/06/2022 1057   GLUCOSE 208 (H) 07/04/2022 1037   BUN 9 09/06/2022 1057   CREATININE 0.78 09/06/2022 1057   CREATININE 0.82 07/05/2016 0942   CALCIUM 9.6 09/06/2022 1057   GFRNONAA >60 07/04/2022 1037   GFRNONAA >89 07/05/2016 0942   GFRAA 99 02/12/2020 1423   GFRAA >89 07/05/2016 0942   Lab Results  Component Value Date   HGBA1C 5.5 10/02/2022   HGBA1C 5.7 09/15/2011   Lab Results  Component Value Date   INSULIN 11.0 01/12/2022   INSULIN 22.7 02/01/2021   Lab Results  Component Value Date   TSH 0.973 10/02/2022   CBC    Component Value Date/Time   WBC 6.6 09/06/2022 1057   WBC 10.4 07/04/2022 1037   RBC 4.78 09/06/2022 1057   RBC 4.76 07/04/2022 1037   HGB 13.9 09/06/2022 1057   HCT 42.8 09/06/2022 1057   PLT 388 09/06/2022 1057   MCV 90 09/06/2022 1057   MCH 29.1 09/06/2022 1057   MCH 29.6 07/04/2022 1037   MCHC 32.5 09/06/2022 1057   MCHC 32.5 07/04/2022 1037   RDW 12.2 09/06/2022 1057   Iron Studies    Component Value Date/Time   IRON 30 06/20/2021 1205   TIBC 444 06/20/2021 1205   FERRITIN 8 (L) 06/20/2021 1205   IRONPCTSAT 7 (LL) 06/20/2021 1205   Lipid Panel     Component Value Date/Time   CHOL 189 12/05/2021 1406   TRIG 119 12/05/2021 1406   HDL 61 12/05/2021 1406   CHOLHDL 3.1 12/05/2021 1406   CHOLHDL 3.8  12/09/2014 1102   VLDL 25 12/09/2014 1102   LDLCALC 107 (H) 12/05/2021 1406   Hepatic Function Panel     Component Value Date/Time  PROT 7.1 12/31/2021 0940   PROT 7.4 10/05/2021 1215   ALBUMIN 3.7 12/31/2021 0940   ALBUMIN 4.3 10/05/2021 1215   AST 23 12/31/2021 0940   ALT 20 12/31/2021 0940   ALKPHOS 50 12/31/2021 0940   BILITOT 0.5 12/31/2021 0940   BILITOT 0.4 10/05/2021 1215   BILIDIR 0.1 02/02/2009 2254   IBILI 0.6 02/02/2009 2254      Component Value Date/Time   TSH 0.973 10/02/2022 1223   Nutritional Lab Results  Component Value Date   VD25OH 45.6 10/02/2022   VD25OH 46.1 01/12/2022   VD25OH 37.2 10/05/2021    Attestations:   I, Clinical biochemist, acting as a Stage manager for [employer] DO., have compiled all relevant documentation for today's office visit on behalf of Thomasene Lot, DO, while in the presence of [employer], DO.  I have reviewed the above documentation for accuracy and completeness, and I agree with the above. Karen Brennan, D.O.  The 21st Century Cures Act was signed into law in 2016 which includes the topic of electronic health records.  This provides immediate access to information in MyChart.  This includes consultation notes, operative notes, office notes, lab results and pathology reports.  If you have any questions about what you read please let us know at your next visit so we can discuss your concerns and take corrective action if need be.  We are right here with you.

## 2023-01-30 ENCOUNTER — Telehealth: Payer: Self-pay

## 2023-02-02 NOTE — Telephone Encounter (Signed)
[facility] from [facility] for CETIRIZINE HCL 10MG  TABLET. Don see on current medication list. If you want patient to take this medication. Please send Rx to pharmacy with instructions, dose and duration. Aquilla Solian, CMA

## 2023-02-16 ENCOUNTER — Encounter: Payer: Self-pay | Admitting: Family Medicine

## 2023-02-16 ENCOUNTER — Other Ambulatory Visit (HOSPITAL_COMMUNITY)
Admission: RE | Admit: 2023-02-16 | Discharge: 2023-02-16 | Disposition: A | Discharge status: Home / Self Care | Payer: MEDICAID | Source: Ambulatory Visit | Attending: Family Medicine | Admitting: Family Medicine

## 2023-02-16 ENCOUNTER — Other Ambulatory Visit: Payer: Self-pay

## 2023-02-16 ENCOUNTER — Ambulatory Visit (INDEPENDENT_AMBULATORY_CARE_PROVIDER_SITE_OTHER): Payer: MEDICAID | Admitting: Family Medicine

## 2023-02-16 VITALS — BP 138/88 | HR 84 | Ht 73.0 in | Wt 340.8 lb

## 2023-02-16 DIAGNOSIS — E669 Obesity, unspecified: Secondary | ICD-10-CM

## 2023-02-16 DIAGNOSIS — Z23 Encounter for immunization: Secondary | ICD-10-CM

## 2023-02-16 DIAGNOSIS — E1169 Type 2 diabetes mellitus with other specified complication: Secondary | ICD-10-CM | POA: Diagnosis not present

## 2023-02-16 DIAGNOSIS — L304 Erythema intertrigo: Secondary | ICD-10-CM

## 2023-02-16 DIAGNOSIS — Z0000 Encounter for general adult medical examination without abnormal findings: Secondary | ICD-10-CM | POA: Insufficient documentation

## 2023-02-16 MED ORDER — CLOTRIMAZOLE 1 % EX CREA
TOPICAL_CREAM | CUTANEOUS | 0 refills | Status: DC
Start: 2023-02-16 — End: 2023-10-11

## 2023-02-16 MED ORDER — NYSTATIN 100000 UNIT/GM EX POWD
1.0000 | Freq: Three times a day (TID) | CUTANEOUS | 0 refills | Status: DC
Start: 2023-02-16 — End: 2023-07-05

## 2023-02-16 NOTE — Patient Instructions (Addendum)
Thank you for coming in today!  Things we discussed today: I placed a referral to GI for a colonoscopy. They should call you to schedule this. Let me know if you don't hear anything in 2 weeks. 2.  You can go to [facility] for a mammogram. Call to make an appointment. Their phone number is [phone] 3. I will message you with the results of your lab work from today  Have a great day! Dr Dolan Amen
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
## 2023-02-17 LAB — LIPID PANEL
Chol/HDL Ratio: 2.2 ratio (ref 0.0–4.4)
Cholesterol, Total: 126 mg/dL (ref 100–199)
HDL: 57 mg/dL (ref >39)
LDL Chol Calc (NIH): 56 mg/dL (ref 0–99)
Triglycerides: 62 mg/dL (ref 0–149)
VLDL Cholesterol Cal: 13 mg/dL (ref 5–40)

## 2023-02-17 LAB — HEPATITIS B SURFACE ANTIGEN: Hepatitis B Surface Ag: NEGATIVE

## 2023-02-17 LAB — HIV ANTIBODY (ROUTINE TESTING W REFLEX): HIV Screen 4th Generation wRfx: NONREACTIVE

## 2023-02-17 LAB — HEPATITIS C ANTIBODY: Hep C Virus Ab: NONREACTIVE

## 2023-02-17 LAB — RPR: RPR Ser Ql: NONREACTIVE

## 2023-02-18 LAB — MICROALBUMIN / CREATININE URINE RATIO
Creatinine, Urine: 258.5 mg/dL
Microalb/Creat Ratio: 9 mg/g{creat} (ref 0–29)
Microalbumin, Urine: 24.5 ug/mL

## 2023-02-19 ENCOUNTER — Encounter: Payer: Self-pay | Admitting: Family Medicine

## 2023-02-21 ENCOUNTER — Telehealth (INDEPENDENT_AMBULATORY_CARE_PROVIDER_SITE_OTHER): Payer: Self-pay | Admitting: Family Medicine

## 2023-02-21 LAB — HM DIABETES EYE EXAM

## 2023-02-21 NOTE — Telephone Encounter (Signed)
Patient called in requesting a refill on her Ozempic, Multi Vitamin and Metformin, states she usually gets refills at visit but her visit was cancelled d/t Dr. Val Eagle being out sick. Please send to [facility] on [facility].

## 2023-02-21 NOTE — Telephone Encounter (Signed)
LAST APPOINTMENT DATE: 01/18/23 NEXT APPOINTMENT DATE:03/15/23   [facility] - [place], [place] - [address] [place] [address] Phone: [phone] Fax: [phone]  Patient is requesting a refill of the following medications: No prescriptions requested or ordered in this encounter   Date last filled: 01/18/23 Previously prescribed by Dr Sharee Holster  Lab Results      Component                Value               Date                      HGBA1C                   5.5                 10/02/2022                HGBA1C                   5.7 (H)             06/01/2022                HGBA1C                   6.0 (H)             01/12/2022           Lab Results      Component                Value               Date                      LDLCALC                  56                  02/16/2023                CREATININE               0.78                09/06/2022           Lab Results      Component                Value               Date                      VD25OH                   45.6                10/02/2022                VD25OH                   46.1                01/12/2022                VD25OH  37.2                10/05/2021            BP Readings from Last 3 Encounters: 02/16/23 : 138/88 01/18/23 : 115/62 12/27/22 : 138/89

## 2023-02-22 ENCOUNTER — Ambulatory Visit (INDEPENDENT_AMBULATORY_CARE_PROVIDER_SITE_OTHER): Payer: Medicaid Other | Admitting: Family Medicine

## 2023-02-22 ENCOUNTER — Telehealth: Payer: Self-pay | Admitting: Family Medicine

## 2023-02-22 LAB — CYTOLOGY - PAP
Chlamydia: NEGATIVE
Comment: NEGATIVE
Comment: NEGATIVE
Comment: NEGATIVE
Comment: NORMAL
Diagnosis: NEGATIVE
Diagnosis: REACTIVE
High risk HPV: POSITIVE — AB
Neisseria Gonorrhea: NEGATIVE
Trichomonas: NEGATIVE

## 2023-02-22 NOTE — Telephone Encounter (Signed)
Called patient to discuss lab results and need for colposcopy based on positive HPV from pap. Risk of CIN3+ is 4.1% per ASCCP risk calculator. Patient did not answer and no voicemail option. Will send MyChart message and ask [facility] staff to schedule patient in colpo clinic.

## 2023-02-22 NOTE — Telephone Encounter (Signed)
Ok to refill each x 1

## 2023-02-26 ENCOUNTER — Other Ambulatory Visit (INDEPENDENT_AMBULATORY_CARE_PROVIDER_SITE_OTHER): Payer: Self-pay

## 2023-02-26 ENCOUNTER — Ambulatory Visit (INDEPENDENT_AMBULATORY_CARE_PROVIDER_SITE_OTHER): Payer: MEDICAID | Admitting: Family Medicine

## 2023-02-26 VITALS — BP 112/79 | HR 83 | Ht 73.0 in | Wt 339.4 lb

## 2023-02-26 DIAGNOSIS — E1169 Type 2 diabetes mellitus with other specified complication: Secondary | ICD-10-CM

## 2023-02-26 DIAGNOSIS — Z23 Encounter for immunization: Secondary | ICD-10-CM

## 2023-02-26 DIAGNOSIS — M25572 Pain in left ankle and joints of left foot: Secondary | ICD-10-CM

## 2023-02-26 DIAGNOSIS — Z6841 Body Mass Index (BMI) 40.0 and over, adult: Secondary | ICD-10-CM

## 2023-02-26 MED ORDER — MELOXICAM 15 MG PO TABS
15.0000 mg | ORAL_TABLET | Freq: Every day | ORAL | 0 refills | Status: AC
Start: 2023-02-26 — End: 2023-03-13

## 2023-02-26 MED ORDER — SEMAGLUTIDE (1 MG/DOSE) 4 MG/3ML ~~LOC~~ SOPN: 1.0000 mg | PEN_INJECTOR | SUBCUTANEOUS | 0 refills | Status: DC

## 2023-02-26 NOTE — Patient Instructions (Signed)
Please try meloxicam daily for your pain, please do not take Profen or naproxen with this, please avoid taking this on an empty stomach  Please go across the street to [facility] to obtain an x-ray of your ankle  I will give you a call if your x-ray results are abnormal.  Otherwise I will send you a message on MyChart  I placed a referral for you to see a sports medicine physician for further evaluation of your foot pain

## 2023-02-26 NOTE — Progress Notes (Signed)
    SUBJECTIVE:   CHIEF COMPLAINT / HPI:   L ankle and foot/heel pain worsening x2-3 weeks Patient reports that several years ago she suffered a left ankle fracture.  Since then, she has been having problems.  However, 2 to 3 weeks ago she twisted her ankle and fell and since then has been having worsening swelling and pain.  She also reports pain along the bottom of her foot She reports wearing mostly flat shoes without any arch support Has been using Tylenol and ibuprofen regularly without benefit   PERTINENT  PMH / PSH: N/A  OBJECTIVE:   BP 112/79   Pulse 83   Ht 6\' 1"  (1.854 m)   Wt (!) 339 lb 6 oz (153.9 kg)   SpO2 100%   BMI 44.78 kg/m    General: NAD, pleasant, able to participate in exam Respiratory: No respiratory distress Skin: warm and dry, no rashes noted Psych: Normal affect and mood  Left ankle/foot Inspection: No gross deformity, ecchymosis.  Left ankle does appear slightly swollen compared to right Palpation: Mildly tender to palpation of posterior edge of medial and lateral malleoli.  Nontender over the base of the fifth metatarsal.  Mildly tender over the dorsum of the foot.  Pain with calcaneal squeeze.  Mildly tender to palpation plantar surface of the foot and heel.  ROM: Slightly restricted range of motion about the ankle.  Pain in plantar surface of foot with dorsiflexion of toes Strength: 5/5   ASSESSMENT/PLAN:   Assessment & Plan Left ankle pain, unspecified chronicity Worsening left ankle pain and swelling after traumatic injury to 3 weeks ago, given her tenderness on exam and reported history of ankle fracture several years ago we will obtain x-ray for further evaluation.  Also, concern for plantar fasciitis, would benefit from sports medicine evaluation of her gait and potential need for additional treatment or orthotics, etc.  Prescribed short course of meloxicam for pain control, last BMP in March 2024 unremarkable.    Vonna Drafts, MD [facility]  Health [facility]

## 2023-03-01 ENCOUNTER — Encounter: Payer: Self-pay | Admitting: Internal Medicine

## 2023-03-02 ENCOUNTER — Other Ambulatory Visit: Payer: Self-pay | Admitting: Family Medicine

## 2023-03-02 ENCOUNTER — Encounter: Payer: Self-pay | Admitting: Family Medicine

## 2023-03-02 DIAGNOSIS — Z1231 Encounter for screening mammogram for malignant neoplasm of breast: Secondary | ICD-10-CM

## 2023-03-05 DIAGNOSIS — F250 Schizoaffective disorder, bipolar type: Secondary | ICD-10-CM | POA: Diagnosis not present

## 2023-03-06 ENCOUNTER — Ambulatory Visit: Payer: MEDICAID | Admitting: Family Medicine

## 2023-03-15 ENCOUNTER — Encounter (INDEPENDENT_AMBULATORY_CARE_PROVIDER_SITE_OTHER): Payer: Self-pay | Admitting: Family Medicine

## 2023-03-15 ENCOUNTER — Ambulatory Visit (INDEPENDENT_AMBULATORY_CARE_PROVIDER_SITE_OTHER): Payer: MEDICAID | Admitting: Family Medicine

## 2023-03-15 VITALS — BP 121/88 | HR 86 | Temp 99.0°F | Ht 73.0 in | Wt 332.0 lb

## 2023-03-15 DIAGNOSIS — Z6841 Body Mass Index (BMI) 40.0 and over, adult: Secondary | ICD-10-CM

## 2023-03-15 DIAGNOSIS — E1169 Type 2 diabetes mellitus with other specified complication: Secondary | ICD-10-CM | POA: Diagnosis not present

## 2023-03-15 DIAGNOSIS — K5909 Other constipation: Secondary | ICD-10-CM

## 2023-03-15 DIAGNOSIS — B977 Papillomavirus as the cause of diseases classified elsewhere: Secondary | ICD-10-CM

## 2023-03-15 DIAGNOSIS — Z7985 Long-term (current) use of injectable non-insulin antidiabetic drugs: Secondary | ICD-10-CM

## 2023-03-15 DIAGNOSIS — Z7984 Long term (current) use of oral hypoglycemic drugs: Secondary | ICD-10-CM

## 2023-03-15 DIAGNOSIS — E785 Hyperlipidemia, unspecified: Secondary | ICD-10-CM

## 2023-03-15 MED ORDER — SEMAGLUTIDE (1 MG/DOSE) 4 MG/3ML ~~LOC~~ SOPN
1.0000 mg | PEN_INJECTOR | SUBCUTANEOUS | 0 refills | Status: DC
Start: 2023-03-15 — End: 2023-04-11

## 2023-03-15 MED ORDER — METFORMIN HCL 1000 MG PO TABS
1000.0000 mg | ORAL_TABLET | Freq: Two times a day (BID) | ORAL | 0 refills | Status: DC
Start: 2023-03-15 — End: 2023-04-11

## 2023-03-15 MED ORDER — LINACLOTIDE 72 MCG PO CAPS: 72.0000 ug | ORAL_CAPSULE | Freq: Every day | ORAL | 0 refills | Status: DC

## 2023-03-15 NOTE — Progress Notes (Signed)
Karen Brennan, D.O.  ABFM, ABOM Specializing in Clinical Bariatric Medicine  Office located at: [address] [place], [place]  [address]     Assessment and Plan:   Medications Discontinued During This Encounter  Medication Reason   metFORMIN (GLUCOPHAGE) 1000 MG tablet Reorder   linaclotide (LINZESS) 72 MCG capsule Reorder   Semaglutide, 1 MG/DOSE, 4 MG/3ML SOPN Reorder     Meds ordered this encounter  Medications   Semaglutide, 1 MG/DOSE, 4 MG/3ML SOPN    Sig: Inject 1 mg as directed once a week.    Dispense:  3 mL    Refill:  0   linaclotide (LINZESS) 72 MCG capsule    Sig: Take 1 capsule (72 mcg total) by mouth daily before breakfast.    Dispense:  90 capsule    Refill:  0   metFORMIN (GLUCOPHAGE) 1000 MG tablet    Sig: Take 1 tablet (1,000 mg total) by mouth 2 (two) times daily with a meal.    Dispense:  180 tablet    Refill:  0    Check fasting blood work next OV  Hyperlipidemia associated with type 2 diabetes mellitus (HCC) Assessment & Plan: Lab Results  Component Value Date   CHOL 126 02/16/2023   HDL 57 02/16/2023   LDLCALC 56 02/16/2023   TRIG 62 02/16/2023   CHOLHDL 2.2 02/16/2023   Hyperlipidemia treated with Lipitor 20 mg daily. Pt had lipid panel done on 02/16/23 as part of routine adult health examination. LDL has improved from 107 to 56. TRIG have also improved from 119 to 62. HDL levels normal at 57. C/w statin therapy per PCP/specialist & our treatment plan of a heart-heathy, low cholesterol meal plan   Type 2 diabetes mellitus with obesity (HCC) Assessment & Plan: Hemoglobin A1c of 5.5 on 10/02/22. Currently on Semaglutide 1 mg once a week & Metformin 1,000 mg bid  with adequate clinical response. Pt endorses that hunger and cravings are not problematic & dose not feel any dose change in either meds is indicated at this time. Will refill Semaglutide & Metformin today. Continue with reduced calorie nutritional plan (RCNP).     High risk  HPV infection Assessment & Plan: Pt had PAP test on 02/16/23 that was positive for high risk HPV. She is scheduled for a colposcopy with PCP in the near future.    Other constipation Assessment & Plan: Symptoms have been stable with Linzess 72 mcg daily. Will refill Linzess today - pt reminded about adequate fiber intake & proper hydration.   BMI 50.0-59.9, adult (HCC)- current bmi 43.81 Morbid obesity (HCC)-starting bmi 58.45/date 02/01/21 Assessment & Plan: Since last office visit on 01/18/23 patient's  Muscle mass has increased by 11.2 lb. Fat mass has decreased by 17.6 lb. Total body water has increased by 3.8 lb.  Counseling done on how various foods will affect these numbers and how to maximize success  Total lbs lost to date: 111 lbs  Total weight loss percentage to date: 25.06%  No change to meal plan - see Subjective   Behavioral Intervention Additional resources provided today: n/a  Evidence-based interventions for health behavior change were utilized today including the discussion of self monitoring techniques, problem-solving barriers and SMART goal setting techniques.   Regarding patient's less desirable eating habits and patterns, we employed the technique of small changes.  Pt will specifically work on: doing some form of daily activity for 20 minutes for next visit.    FOLLOW UP: Return  04/11/23. She was informed of the importance of frequent follow up visits to maximize her success with intensive lifestyle modifications for her multiple health conditions.  Subjective:   Chief complaint: Obesity Karen Brennan is here to discuss her progress with her obesity treatment plan. She is on the Category 3 Plan with B/L options and states she is following her eating plan approximately 50% of the time. She states she is walking 20 minutes 3 days per week.  Interval History:  Karen Brennan is here for a follow up office visit. Pt endorses that money has been limited at times, however she  still has been getting in all the ounces of protein on her meal plan. Did eat out during a few celebratory occasions. Overall no c/o hunger and cravings. Pt satisfied with meal plan.   Pharmacotherapy for weight loss: She is currently taking  Metformin 1,000 mg bid & Semaglutide 1 mg once a week   for medical weight loss.  Denies side effects.    Review of Systems:  Pertinent positives were addressed with patient today.  Reviewed by clinician on day of visit: allergies, medications, problem list, medical history, surgical history, family history, social history, and previous encounter notes.  Weight Summary and Biometrics   Weight Lost Since Last Visit: 6lb  Weight Gained Since Last Visit: 0lb   Vitals Temp: 99 F (37.2 C) BP: 121/88 Pulse Rate: 86 SpO2: 99 %   Anthropometric Measurements Height: 6\' 1"  (1.854 m) Weight: (!) 332 lb (150.6 kg) BMI (Calculated): 43.81 Weight at Last Visit: 338lb Weight Lost Since Last Visit: 6lb Weight Gained Since Last Visit: 0lb Starting Weight: 443lb Total Weight Loss (lbs): 111 lb (50.3 kg) Peak Weight: 505lb   Body Composition  Body Fat %: 49.2 % Fat Mass (lbs): 163.6 lbs Muscle Mass (lbs): 160.6 lbs Total Body Water (lbs): 111.6 lbs Visceral Fat Rating : 15   Other Clinical Data Fasting: no Labs: no Today's Visit #: 32 Starting Date: 02/01/21   Objective:   PHYSICAL EXAM: Blood pressure 121/88, pulse 86, temperature 99 F (37.2 C), height 6\' 1"  (1.854 m), weight (!) 332 lb (150.6 kg), SpO2 99%. Body mass index is 43.8 kg/m.  General: Well Developed, well nourished, and in no acute distress.  HEENT: Normocephalic, atraumatic Skin: Warm and dry, cap RF less 2 sec, good turgor Chest:  Normal excursion, shape, no gross abn Respiratory: speaking in full sentences, no conversational dyspnea NeuroM-Sk: Ambulates w/o assistance, moves * 4 Psych: A and O *3, insight good, mood-full  DIAGNOSTIC DATA REVIEWED:  BMET     Component Value Date/Time   NA 142 09/06/2022 1057   K 4.1 09/06/2022 1057   CL 101 09/06/2022 1057   CO2 25 09/06/2022 1057   GLUCOSE 119 (H) 09/06/2022 1057   GLUCOSE 208 (H) 07/04/2022 1037   BUN 9 09/06/2022 1057   CREATININE 0.78 09/06/2022 1057   CREATININE 0.82 07/05/2016 0942   CALCIUM 9.6 09/06/2022 1057   GFRNONAA >60 07/04/2022 1037   GFRNONAA >89 07/05/2016 0942   GFRAA 99 02/12/2020 1423   GFRAA >89 07/05/2016 0942   Lab Results  Component Value Date   HGBA1C 5.5 10/02/2022   HGBA1C 5.7 09/15/2011   Lab Results  Component Value Date   INSULIN 11.0 01/12/2022   INSULIN 22.7 02/01/2021   Lab Results  Component Value Date   TSH 0.973 10/02/2022   CBC    Component Value Date/Time   WBC 6.6 09/06/2022 1057   WBC 10.4  07/04/2022 1037   RBC 4.78 09/06/2022 1057   RBC 4.76 07/04/2022 1037   HGB 13.9 09/06/2022 1057   HCT 42.8 09/06/2022 1057   PLT 388 09/06/2022 1057   MCV 90 09/06/2022 1057   MCH 29.1 09/06/2022 1057   MCH 29.6 07/04/2022 1037   MCHC 32.5 09/06/2022 1057   MCHC 32.5 07/04/2022 1037   RDW 12.2 09/06/2022 1057   Iron Studies    Component Value Date/Time   IRON 30 06/20/2021 1205   TIBC 444 06/20/2021 1205   FERRITIN 8 (L) 06/20/2021 1205   IRONPCTSAT 7 (LL) 06/20/2021 1205   Lipid Panel     Component Value Date/Time   CHOL 126 02/16/2023 1605   TRIG 62 02/16/2023 1605   HDL 57 02/16/2023 1605   CHOLHDL 2.2 02/16/2023 1605   CHOLHDL 3.8 12/09/2014 1102   VLDL 25 12/09/2014 1102   LDLCALC 56 02/16/2023 1605   Hepatic Function Panel     Component Value Date/Time   PROT 7.1 12/31/2021 0940   PROT 7.4 10/05/2021 1215   ALBUMIN 3.7 12/31/2021 0940   ALBUMIN 4.3 10/05/2021 1215   AST 23 12/31/2021 0940   ALT 20 12/31/2021 0940   ALKPHOS 50 12/31/2021 0940   BILITOT 0.5 12/31/2021 0940   BILITOT 0.4 10/05/2021 1215   BILIDIR 0.1 02/02/2009 2254   IBILI 0.6 02/02/2009 2254      Component Value Date/Time   TSH 0.973  10/02/2022 1223   Nutritional Lab Results  Component Value Date   VD25OH 45.6 10/02/2022   VD25OH 46.1 01/12/2022   VD25OH 37.2 10/05/2021    Attestations:   I, Karen Brennan, acting as a Stage manager for [employer], DO., have compiled all relevant documentation for today's office visit on behalf of Karen Lot, DO, while in the presence of [employer], DO.  I have reviewed the above documentation for accuracy and completeness, and I agree with the above. Karen Brennan, D.O.  The 21st Century Cures Act was signed into law in 2016 which includes the topic of electronic health records.  This provides immediate access to information in MyChart.  This includes consultation notes, operative notes, office notes, lab results and pathology reports.  If you have any questions about what you read please let us know at your next visit so we can discuss your concerns and take corrective action if need be.  We are right here with you.

## 2023-03-16 DIAGNOSIS — F250 Schizoaffective disorder, bipolar type: Secondary | ICD-10-CM | POA: Diagnosis not present

## 2023-03-19 ENCOUNTER — Ambulatory Visit (AMBULATORY_SURGERY_CENTER): Payer: MEDICAID

## 2023-03-19 VITALS — Ht 73.0 in | Wt 340.0 lb

## 2023-03-19 DIAGNOSIS — Z1211 Encounter for screening for malignant neoplasm of colon: Secondary | ICD-10-CM

## 2023-03-19 MED ORDER — PEG 3350-KCL-NA BICARB-NACL 420 G PO SOLR
4000.0000 mL | Freq: Once | ORAL | 0 refills | Status: AC
Start: 2023-03-19 — End: 2023-03-19

## 2023-03-19 NOTE — Progress Notes (Signed)

## 2023-03-22 ENCOUNTER — Other Ambulatory Visit (INDEPENDENT_AMBULATORY_CARE_PROVIDER_SITE_OTHER): Payer: Self-pay | Admitting: Family Medicine

## 2023-03-23 ENCOUNTER — Telehealth: Payer: Self-pay | Admitting: *Deleted

## 2023-03-23 NOTE — Telephone Encounter (Signed)
PA SUBMITTED VIA [employer] FOR OZEMPIC. AWAITING ON QUESTIONS FROM INSURANCE COMPANY.    Karen Brennan (Key: [id number])  [employer] is processing your PA request and will respond shortly with next steps. You may close this dialog, return to your dashboard, and perform other tasks. To check for an update later, open this request again from your dashboard.  If you need assistance, please chat with CoverMyMeds or call us at [phone].

## 2023-03-26 NOTE — Telephone Encounter (Signed)
Approved. OZEMPIC (1 MG/DOSE) 4MG /3ML Soln Pen-inj is approved from 03/26/2023 to 07/13/2023. All strengths of the drug are approved.. Authorization Expiration Date: July 13, 2023.

## 2023-03-27 ENCOUNTER — Encounter: Payer: Self-pay | Admitting: Family Medicine

## 2023-03-27 ENCOUNTER — Ambulatory Visit (INDEPENDENT_AMBULATORY_CARE_PROVIDER_SITE_OTHER): Payer: MEDICAID | Admitting: Family Medicine

## 2023-03-27 VITALS — BP 114/70 | Ht 73.0 in | Wt 340.0 lb

## 2023-03-27 DIAGNOSIS — M25372 Other instability, left ankle: Secondary | ICD-10-CM

## 2023-03-27 DIAGNOSIS — M722 Plantar fascial fibromatosis: Secondary | ICD-10-CM

## 2023-03-27 MED ORDER — MELOXICAM 15 MG PO TABS: 15.0000 mg | ORAL_TABLET | Freq: Every day | ORAL | 2 refills | Status: DC

## 2023-03-27 NOTE — Progress Notes (Addendum)
PCP: Lorayne Bender, MD  Chief Complaint: Left ankle, heel pain Subjective:   HPI: Patient is a 47 y.o. female here for Left ankle and heel pain.  Patient states that she fractured her left ankle many many years ago, when she was 47 years old and at that time was given a brace.  Patient states that she has been wearing an ASO brace since then.  Patient states that the same ASO brace she is wearing during this visit is the same when she was given back then.  Patient states that she did not know she was not supposed to wear the brace persistently.  Patient states that whenever she does not wear the brace she is having a very difficult time walking and is unable to.  Patient also notes that her ankle will randomly hurt throughout the day whether she is putting weight on it or not.  Patient also notes she has some left heel pain.  Patient states that her first up in the morning is painful and that it might slowly get better throughout the day.  Patient has not tried any rehab exercises for plantar fasciitis yet.  Patient has no other concerns at this time..   Past Medical History:  Diagnosis Date   ANEMIA 08/14/2008   Qualifier: Diagnosis of  By: Karn Pickler MD, Jessica     Anxiety    Asthma    B12 deficiency 08/29/2021   Bipolar 1 disorder (HCC)    Blood transfusion    Cellulitis 07/15/2020   Of right buttock   Chest pain    Depression    Edema of both lower extremities    GERD (gastroesophageal reflux disease)    History of bronchitis    Hypertension    Kidney stones 10/09/2011   Migraines 10/09/2011   "often"   Morbid obesity with BMI of 50.0-59.9, adult (HCC) 08/09/2006   Qualifier: Diagnosis of   By: Karn Pickler MD, Jessica         Pre-diabetes    Schizophrenia (HCC)    SOB (shortness of breath)    Vitamin D deficiency 08/29/2021    Current Outpatient Medications on File Prior to Visit  Medication Sig Dispense Refill   Accu-Chek Softclix Lancets lancets USE TO test fasting blood sugar  EVERY DAY IN THE MORNING 100 each 6   albuterol (VENTOLIN HFA) 108 (90 Base) MCG/ACT inhaler inhale 2 puffs BY MOUTH EVERY 4 HOURS AS NEEDED FOR wheezing OR SHORTNESS OF BREATH 18 g 1   atorvastatin (LIPITOR) 20 MG tablet Take 1 tablet (20 mg total) by mouth daily. 90 tablet 3   benztropine (COGENTIN) 1 MG tablet Take 1 mg by mouth 2 (two) times daily.     blood glucose meter kit and supplies KIT Dispense based on patient and insurance preference. Use up to four times daily as directed. (FOR ICD-9 250.00, 250.01). 1 each 0   Blood Glucose Monitoring Suppl DEVI 1 each by Does not apply route 2 (two) times daily. May substitute to any manufacturer covered by patient's insurance. check sugars fasting and post-prandial daily. 1 each 0   busPIRone (BUSPAR) 7.5 MG tablet Take 7.5 mg by mouth 2 (two) times daily.     clotrimazole (LOTRIMIN) 1 % cream APPLY TO AFFECTED AREA TWICE A DAY 30 g 0   cyanocobalamin (VITAMIN B12) 1000 MCG tablet Take 1 tablet (1,000 mcg total) by mouth daily.     ferrous sulfate 325 (65 FE) MG tablet Take 325 mg by mouth in the  morning and at bedtime.     FLUoxetine (PROZAC) 40 MG capsule Take 40 mg by mouth daily.     fluticasone (FLONASE) 50 MCG/ACT nasal spray place 2 SPRAYS in each nostril DAILY AS NEEDED FOR allergies OR RHINITIS 16 g 0   gabapentin (NEURONTIN) 300 MG capsule Take 1 capsule (300 mg total) by mouth 3 (three) times daily. 90 capsule 1   GOODSENSE ALLERGY RELIEF 10 MG tablet TAKE 1 Tablet BY MOUTH ONCE DAILY 90 tablet 0   guaiFENesin (MUCINEX) 600 MG 12 hr tablet Take by mouth 2 (two) times daily.     haloperidol (HALDOL) 2 MG tablet Take 2 mg by mouth daily.     hydrochlorothiazide (HYDRODIURIL) 12.5 MG tablet Take 1 tablet (12.5 mg total) by mouth daily. 90 tablet 3   hydrOXYzine (VISTARIL) 25 MG capsule Take 25 mg by mouth every evening. Every evening as needed     linaclotide (LINZESS) 72 MCG capsule Take 1 capsule (72 mcg total) by mouth daily before  breakfast. 90 capsule 0   metFORMIN (GLUCOPHAGE) 1000 MG tablet Take 1 tablet (1,000 mg total) by mouth 2 (two) times daily with a meal. 180 tablet 0   metoprolol tartrate (LOPRESSOR) 25 MG tablet TAKE ONE-HALF TABLET BY MOUTH TWICE DAILY AS NEEDED 90 tablet 3   nystatin (MYCOSTATIN/NYSTOP) powder Apply 1 Application topically 3 (three) times daily. 15 g 0   OLANZapine (ZYPREXA) 20 MG tablet Take 20 mg by mouth at bedtime.      ondansetron (ZOFRAN) 4 MG tablet Take 1 tablet (4 mg total) by mouth every 8 (eight) hours as needed for nausea or vomiting. (Patient not taking: Reported on 03/19/2023) 20 tablet 0   Prenatal Vit-Fe Fum-FA-Omega (PNV PRENATAL PLUS MULTIVIT+DHA) 27-1 & 312 MG MISC 1 po qd 90 each 0   Probiotic Product (ACIDOPHILUS PROBIOTIC BLEND PO) Take by mouth. (Patient not taking: Reported on 03/19/2023)     Semaglutide, 1 MG/DOSE, 4 MG/3ML SOPN Inject 1 mg as directed once a week. 3 mL 0   traZODone (DESYREL) 50 MG tablet Take 50 mg by mouth at bedtime as needed for sleep.     valsartan (DIOVAN) 80 MG tablet Take 1 tablet (80 mg total) by mouth at bedtime. 90 tablet 3   Vitamin D, Ergocalciferol, (DRISDOL) 1.25 MG (50000 UNIT) CAPS capsule 1 tab q wed and 1 tab q sun 8 capsule 0   No current facility-administered medications on file prior to visit.    Past Surgical History:  Procedure Laterality Date   ANKLE FRACTURE SURGERY   1990's   "left; put screws in"   ENDOMETRIAL ABLATION  ~ 2011   FRACTURE SURGERY     GASTRIC BY PASS     TUBAL LIGATION  2006    Allergies  Allergen Reactions   Sibutramine Hcl Monohydrate Hives    BP 114/70   Ht 6\' 1"  (1.854 m)   Wt (!) 340 lb (154.2 kg)   BMI 44.86 kg/m       No data to display              No data to display              Objective:  Physical Exam:  Gen: NAD, comfortable in exam room  Ankle/Foot, Left: Mild visible swelling of left ankle compared to right, no erythea ecchymosis, or bony deformity. Range of  motion is full in all directions though there is pain noted with both plantar and dorsiflexion.  Strength is 5/5 in all directions. No tenderness at the insertion/body/myotendinous junction of the Achilles tendon; No peroneal tendon tenderness or subluxation; Mild tenderness on posterior aspects of lateral, no TTP over medial malleolus; TTP over the ATLF; Talar dome nontender; Mild plantar calcaneal tenderness; No tenderness over the navicular prominence or base of the 5th MT; No tenderness over cuboid; No tenderness at the distal metatarsals; Able to walk 4 steps.  Provocative Testing:   - Anterior Drawer: NEG  - Talar Tilt: NEG  - Kleiger's Test: NEG  - Tib/Fib Squeeze Test: NEG; Calcaneal Squeeze Test: NEG  - Tinel's test at Tarsal Tunnel: NEG    Assessment & Plan:  1. 1. Instability of left ankle joint - Patient's ankle pain is located mainly on the anterior portion over the TLIF as well as some pain on the lateral malleolus.  Patient's pain is likely related to instability given that patient states she has been using the ankle brace for some 30 years now.  Patient states that they did not know that they were supposed to not wear the brace.  At this time, will have patient go complete her x-rays to evaluate for any structural issues that may be causing her pain.  Patient would benefit from a more vigorous rehab program in order to increase both the mobility as well as the stability of the left ankle joint.  In the meantime given that patient cannot walk without the ASO brace, was fitted with new ASO today.  Patient needs to follow-up in the sports med clinic as soon as she gets her x-rays done for further evaluation.  2. Plantar fasciitis of left foot -Patient's heel pain is likely related to plantar fasciitis, discussed with patient the benefits of doing at home therapy for now.  Patient would like to try that.  Will also do meloxicam as needed to help with pain.  Patient is  agreeable.    Brenton Grills MD, PGY-4  Sports Medicine Fellow [facility]  Addendum:  Patient seen in the office by fellow.  History, exam, plan of care were precepted with me.  Darene Lamer, DO, CAQSM

## 2023-03-30 ENCOUNTER — Ambulatory Visit
Admission: RE | Admit: 2023-03-30 | Discharge: 2023-03-30 | Disposition: A | Discharge status: Home / Self Care | Payer: MEDICAID | Source: Ambulatory Visit | Attending: Family Medicine | Admitting: Family Medicine

## 2023-03-30 DIAGNOSIS — M25572 Pain in left ankle and joints of left foot: Secondary | ICD-10-CM

## 2023-04-02 ENCOUNTER — Encounter: Payer: MEDICAID | Admitting: Internal Medicine

## 2023-04-04 ENCOUNTER — Ambulatory Visit
Admission: RE | Admit: 2023-04-04 | Discharge: 2023-04-04 | Disposition: A | Discharge status: Home / Self Care | Payer: MEDICAID | Source: Ambulatory Visit | Attending: Family Medicine | Admitting: Family Medicine

## 2023-04-04 DIAGNOSIS — Z1231 Encounter for screening mammogram for malignant neoplasm of breast: Secondary | ICD-10-CM

## 2023-04-11 ENCOUNTER — Encounter (INDEPENDENT_AMBULATORY_CARE_PROVIDER_SITE_OTHER): Payer: Self-pay | Admitting: Family Medicine

## 2023-04-11 ENCOUNTER — Encounter (INDEPENDENT_AMBULATORY_CARE_PROVIDER_SITE_OTHER): Payer: Self-pay

## 2023-04-11 ENCOUNTER — Other Ambulatory Visit: Payer: Self-pay

## 2023-04-11 ENCOUNTER — Ambulatory Visit (INDEPENDENT_AMBULATORY_CARE_PROVIDER_SITE_OTHER): Payer: MEDICAID | Admitting: Family Medicine

## 2023-04-11 VITALS — BP 136/85 | HR 87 | Temp 99.1°F | Ht 73.0 in | Wt 334.0 lb

## 2023-04-11 DIAGNOSIS — E669 Obesity, unspecified: Secondary | ICD-10-CM

## 2023-04-11 DIAGNOSIS — E538 Deficiency of other specified B group vitamins: Secondary | ICD-10-CM

## 2023-04-11 DIAGNOSIS — Z7984 Long term (current) use of oral hypoglycemic drugs: Secondary | ICD-10-CM | POA: Diagnosis not present

## 2023-04-11 DIAGNOSIS — E1169 Type 2 diabetes mellitus with other specified complication: Secondary | ICD-10-CM

## 2023-04-11 DIAGNOSIS — G5793 Unspecified mononeuropathy of bilateral lower limbs: Secondary | ICD-10-CM

## 2023-04-11 DIAGNOSIS — Z6841 Body Mass Index (BMI) 40.0 and over, adult: Secondary | ICD-10-CM | POA: Diagnosis not present

## 2023-04-11 DIAGNOSIS — K5909 Other constipation: Secondary | ICD-10-CM | POA: Diagnosis not present

## 2023-04-11 DIAGNOSIS — D508 Other iron deficiency anemias: Secondary | ICD-10-CM

## 2023-04-11 DIAGNOSIS — E559 Vitamin D deficiency, unspecified: Secondary | ICD-10-CM

## 2023-04-11 MED ORDER — VITAMIN D (ERGOCALCIFEROL) 1.25 MG (50000 UNIT) PO CAPS: ORAL_CAPSULE | ORAL | 0 refills | Status: DC

## 2023-04-11 MED ORDER — METFORMIN HCL 1000 MG PO TABS
1000.0000 mg | ORAL_TABLET | Freq: Two times a day (BID) | ORAL | 0 refills | Status: DC
Start: 2023-04-11 — End: 2023-05-09

## 2023-04-11 MED ORDER — SEMAGLUTIDE (1 MG/DOSE) 4 MG/3ML ~~LOC~~ SOPN
1.0000 mg | PEN_INJECTOR | SUBCUTANEOUS | 0 refills | Status: DC
Start: 2023-04-11 — End: 2023-05-09

## 2023-04-11 MED ORDER — VITAMIN B-12 1000 MCG PO TABS: 1000.0000 ug | ORAL_TABLET | Freq: Every day | ORAL | Status: DC

## 2023-04-11 MED ORDER — LINACLOTIDE 72 MCG PO CAPS
72.0000 ug | ORAL_CAPSULE | Freq: Every day | ORAL | 0 refills | Status: DC
Start: 2023-04-11 — End: 2023-05-09

## 2023-04-11 MED ORDER — GABAPENTIN 300 MG PO CAPS
300.0000 mg | ORAL_CAPSULE | Freq: Three times a day (TID) | ORAL | 1 refills | Status: DC
Start: 2023-04-11 — End: 2023-06-15

## 2023-04-11 MED ORDER — PNV PRENATAL PLUS MULTIVIT+DHA 27-1 & 312 MG PO MISC
ORAL | 0 refills | Status: DC
Start: 2023-04-11 — End: 2023-05-09

## 2023-04-11 NOTE — Progress Notes (Signed)
Karen Brennan, D.O.  ABFM, ABOM Specializing in Clinical Bariatric Medicine  Office located at: [address] [place], [place]  [address]     Assessment and Plan:   Orders Placed This Encounter  Procedures   VITAMIN D 25 Hydroxy (Vit-D Deficiency, Fractures)   Vitamin B12   Magnesium   Hemoglobin A1c   Comprehensive metabolic panel   TSH   Medications Discontinued During This Encounter  Medication Reason   Prenatal Vit-Fe Fum-FA-Omega (PNV PRENATAL PLUS MULTIVIT+DHA) 27-1 & 312 MG MISC Reorder   cyanocobalamin (VITAMIN B12) 1000 MCG tablet Reorder   Semaglutide, 1 MG/DOSE, 4 MG/3ML SOPN Reorder   linaclotide (LINZESS) 72 MCG capsule Reorder   metFORMIN (GLUCOPHAGE) 1000 MG tablet Reorder   Vitamin D, Ergocalciferol, (DRISDOL) 1.25 MG (50000 UNIT) CAPS capsule Reorder    Meds ordered this encounter  Medications   cyanocobalamin (VITAMIN B12) 1000 MCG tablet    Sig: Take 1 tablet (1,000 mcg total) by mouth daily.   linaclotide (LINZESS) 72 MCG capsule    Sig: Take 1 capsule (72 mcg total) by mouth daily before breakfast.    Dispense:  90 capsule    Refill:  0   metFORMIN (GLUCOPHAGE) 1000 MG tablet    Sig: Take 1 tablet (1,000 mg total) by mouth 2 (two) times daily with a meal.    Dispense:  180 tablet    Refill:  0   Prenatal Vit-Fe Fum-FA-Omega (PNV PRENATAL PLUS MULTIVIT+DHA) 27-1 & 312 MG MISC    Sig: 1 po qd    Dispense:  90 each    Refill:  0   Semaglutide, 1 MG/DOSE, 4 MG/3ML SOPN    Sig: Inject 1 mg as directed once a week.    Dispense:  3 mL    Refill:  0   Vitamin D, Ergocalciferol, (DRISDOL) 1.25 MG (50000 UNIT) CAPS capsule    Sig: 1 tab q wed and 1 tab q sun    Dispense:  8 capsule    Refill:  0    90 d supply;  ** OV for RF **   Do not send RF request   Labs obtained today will be reviewed at her next visit.    Other constipation Assessment & Plan: Pt is taking Linzess 72 mcg to mange her constipation. Tolerating well with no side  effects. I advise she stay hydrated and drink at least half her weight in ounces of water. Continue with current regimen. We will monitor her condition as it pertains to her weight loss journey.    Type 2 diabetes mellitus with obesity [facility]) Assessment & Plan: Lab Results  Component Value Date   HGBA1C 5.5 04/11/2023   HGBA1C 5.5 10/02/2022   HGBA1C 5.7 (H) 06/01/2022   INSULIN 11.0 01/12/2022   INSULIN 22.7 02/01/2021    Pt taking Metformin once in the morning and once at nighttime. Tolerating well with no side effects. She is also taking 1 mg semaglutide currently. Tolerating well. Reports no adverse side effects since starting this medication. Denies any associated reflux or constipation. We extensively discussed the importance of decreasing simple carbs, increasing proteins and how certain foods they eat will affect their blood sugars. Follow meal plan. Continue on current medication regimen. Will continue to monitor her condition closely. Will refill Semaglutide today. Will check fasting A1C, CMP, and TSH today.     - Counseled patient on pathophysiology of disease and discussed how good blood sugar control is important to decrease  the risk of diabetic complications such as nephropathy, neuropathy, limb loss, blindness, coronary artery disease, etc.   - Intensive lifestyle modification including diet, exercise and weight loss are the first line of treatment for diabetes. We extensively discussed the importance of decreasing simple carbs, increasing proteins and how certain foods they eat will affect their blood sugars - Reminded Karen Brennan if she feels poorly- check Blood Sugar and Blood Pressure at that time.    - Hypoglycemia prevention discussed with the patient.  Eat on a regular basis- no skipping or going long periods without eating.     - Recommend that any concerns about medicines should be directed at the prescribing provider - Recheck labs in 3 months if not done at Endo  provider / PCP.  - Importance of f/up with PCP and all other specialists, as scheduled, was stressed to the patient today    Other iron deficiency anemia Assessment & Plan: Pt is taking prenatals to manage her anemia. Tolerating well. Continue on this current regimen. Will continue to monitor her condition as it pertains to weight loss.    Morbid obesity (HCC)-starting bmi 58.45/date 02/01/21 BMI 40.0-44.9, adult (HCC)- current BMI 44.08 Assessment & Plan: Karen Brennan is here to discuss her progress with her obesity treatment plan along with follow-up of her obesity related diagnoses. See Medical Weight Management Flowsheet for complete bioelectrical impedance results.  Very extensive education and counseling  on healthy food options given to patient for snacks and meals. I encouraged her to try snacks that are low net carb and high in protein and fiber. I suggested the option of pairing a food high in protein with fruits to help increase protein intake with snacks. Avoid eating fruits with high carbs and sugars and low protein. Educated pt on Glycemic Index and how foods high on the glycemic index may increasing blood glucose levels, hunger, and cravings. Multiple handouts given and reviewed with patient.  Since last office visit on 03/15/2023 patient's muscle mass has decreased by 5.8 lbs. Fat mass has increased by 7.4 lbs. Total body water has increased by 4.4 lbs.  Counseling done on how various foods will affect these numbers and how to maximize success.   Total lbs lost to date: 109 lbs Total weight loss percentage to date: -24.6 %  No change to meal plan - see Subjective  Behavioral Intervention Additional resources provided today: Healthy complex carbs, healthy sources of protein, overview of healthy eating, and multiple handouts on glycemic index and glycemic load of fruits, vegetables, and common foods.  Evidence-based interventions for health behavior change were utilized today  including the discussion of self monitoring techniques, problem-solving barriers and SMART goal setting techniques.   Regarding patient's less desirable eating habits and patterns, we employed the technique of small changes.  Pt will specifically work on: Increasing protein and reading more info on glycemic index for next visit.    FOLLOW UP: Return in about 2 weeks (around 04/25/2023). She was informed of the importance of frequent follow up visits to maximize her success with intensive lifestyle modifications for her multiple health conditions.  Subjective:   Chief complaint: Obesity Karen Brennan is here to discuss her progress with her obesity treatment plan. She is on the Category 3 Plan with B/L options and states she is following her eating plan approximately 80% of the time. She states she is walking 20 minutes 3 days per week.  Interval History:  Karen Brennan is here for a follow  up office visit. Since last OV, she has not been eating enough protein. She also endorses hunger and cravings. Sometimes she skips lunch by accident when the "time flies". She tends to snack on bananas and fruits to avoid eating sweets.   Pharmacotherapy for weight loss: She is currently taking  Metformin and Semaglutide  for medical weight loss.  Denies side effects.    Review of Systems:  Pertinent positives were addressed with patient today.  Reviewed by clinician on day of visit: allergies, medications, problem list, medical history, surgical history, family history, social history, and previous encounter notes.  Weight Summary and Biometrics   Weight Lost Since Last Visit: 0lb  Weight Gained Since Last Visit: 2lb   Vitals Temp: 99.1 F (37.3 C) BP: 136/85 Pulse Rate: 87 SpO2: 100 %   Anthropometric Measurements Height: 6\' 1"  (1.854 m) Weight: (!) 334 lb (151.5 kg) BMI (Calculated): 44.08 Weight at Last Visit: 332lb Weight Lost Since Last Visit: 0lb Weight Gained Since Last Visit:  2lb Starting Weight: 443lb Total Weight Loss (lbs): 109 lb (49.4 kg) Peak Weight: 505lb   Body Composition  Body Fat %: 51.2 % Fat Mass (lbs): 171 lbs Muscle Mass (lbs): 154.8 lbs Total Body Water (lbs): 116 lbs Visceral Fat Rating : 16   Other Clinical Data Fasting: yes Labs: yes Today's Visit #: 33 Starting Date: 02/01/21    Objective:   PHYSICAL EXAM: Blood pressure 136/85, pulse 87, temperature 99.1 F (37.3 C), height 6\' 1"  (1.854 m), weight (!) 334 lb (151.5 kg), SpO2 100%. Body mass index is 44.07 kg/m.  General: Well Developed, well nourished, and in no acute distress.  HEENT: Normocephalic, atraumatic Skin: Warm and dry, cap RF less 2 sec, good turgor Chest:  Normal excursion, shape, no gross abn Respiratory: speaking in full sentences, no conversational dyspnea NeuroM-Sk: Ambulates w/o assistance, moves * 4 Psych: A and O *3, insight good, mood-full  DIAGNOSTIC DATA REVIEWED:  BMET    Component Value Date/Time   NA 142 09/06/2022 1057   K 4.1 09/06/2022 1057   CL 101 09/06/2022 1057   CO2 25 09/06/2022 1057   GLUCOSE 119 (H) 09/06/2022 1057   GLUCOSE 208 (H) 07/04/2022 1037   BUN 9 09/06/2022 1057   CREATININE 0.78 09/06/2022 1057   CREATININE 0.82 07/05/2016 0942   CALCIUM 9.6 09/06/2022 1057   GFRNONAA >60 07/04/2022 1037   GFRNONAA >89 07/05/2016 0942   GFRAA 99 02/12/2020 1423   GFRAA >89 07/05/2016 0942   Lab Results  Component Value Date   HGBA1C 5.5 10/02/2022   HGBA1C 5.7 09/15/2011   Lab Results  Component Value Date   INSULIN 11.0 01/12/2022   INSULIN 22.7 02/01/2021   Lab Results  Component Value Date   TSH 0.973 10/02/2022   CBC    Component Value Date/Time   WBC 6.6 09/06/2022 1057   WBC 10.4 07/04/2022 1037   RBC 4.78 09/06/2022 1057   RBC 4.76 07/04/2022 1037   HGB 13.9 09/06/2022 1057   HCT 42.8 09/06/2022 1057   PLT 388 09/06/2022 1057   MCV 90 09/06/2022 1057   MCH 29.1 09/06/2022 1057   MCH 29.6  07/04/2022 1037   MCHC 32.5 09/06/2022 1057   MCHC 32.5 07/04/2022 1037   RDW 12.2 09/06/2022 1057   Iron Studies    Component Value Date/Time   IRON 30 06/20/2021 1205   TIBC 444 06/20/2021 1205   FERRITIN 8 (L) 06/20/2021 1205   IRONPCTSAT 7 (LL) 06/20/2021 1205  Lipid Panel     Component Value Date/Time   CHOL 126 02/16/2023 1605   TRIG 62 02/16/2023 1605   HDL 57 02/16/2023 1605   CHOLHDL 2.2 02/16/2023 1605   CHOLHDL 3.8 12/09/2014 1102   VLDL 25 12/09/2014 1102   LDLCALC 56 02/16/2023 1605   Hepatic Function Panel     Component Value Date/Time   PROT 7.1 12/31/2021 0940   PROT 7.4 10/05/2021 1215   ALBUMIN 3.7 12/31/2021 0940   ALBUMIN 4.3 10/05/2021 1215   AST 23 12/31/2021 0940   ALT 20 12/31/2021 0940   ALKPHOS 50 12/31/2021 0940   BILITOT 0.5 12/31/2021 0940   BILITOT 0.4 10/05/2021 1215   BILIDIR 0.1 02/02/2009 2254   IBILI 0.6 02/02/2009 2254      Component Value Date/Time   TSH 0.973 10/02/2022 1223   Nutritional Lab Results  Component Value Date   VD25OH 45.6 10/02/2022   VD25OH 46.1 01/12/2022   VD25OH 37.2 10/05/2021    Attestations:    Reviewed by clinician on day of visit: allergies, medications, problem list, medical history, surgical history, family history, social history, and previous encounter notes pertinent to patient's obesity diagnosis.    This encounter took 40 total minutes of time including time coordinating the above treatment plan, any pre-visit chart review and post-visit documentation and reviewing any labs and/or imaging, reviewing pertinent prior notes, and providing counseling the patient about such treatment plan.  Approximately 50% of this time was spent in direct, face-to-face counseling and coordination of care.  I, Isabelle Course, acting as a medical scribe for Thomasene Lot, DO., have compiled all relevant documentation for today's office visit on behalf of Thomasene Lot, DO, while in the presence of [employer], DO.  I have reviewed the above documentation for accuracy and completeness, and I agree with the above. Karen Brennan, D.O.  The 21st Century Cures Act was signed into law in 2016 which includes the topic of electronic health records.  This provides immediate access to information in MyChart.  This includes consultation notes, operative notes, office notes, lab results and pathology reports.  If you have any questions about what you read please let us know at your next visit so we can discuss your concerns and take corrective action if need be.  We are right here with you.

## 2023-04-12 ENCOUNTER — Other Ambulatory Visit: Payer: Self-pay | Admitting: Family Medicine

## 2023-04-12 ENCOUNTER — Ambulatory Visit: Payer: MEDICAID | Admitting: Family Medicine

## 2023-04-12 VITALS — BP 132/84 | HR 86 | Ht 73.0 in | Wt 337.4 lb

## 2023-04-12 DIAGNOSIS — R87618 Other abnormal cytological findings on specimens from cervix uteri: Secondary | ICD-10-CM

## 2023-04-12 DIAGNOSIS — R87619 Unspecified abnormal cytological findings in specimens from cervix uteri: Secondary | ICD-10-CM | POA: Diagnosis not present

## 2023-04-12 LAB — COMPREHENSIVE METABOLIC PANEL
ALT: 16 [IU]/L (ref 0–32)
AST: 20 [IU]/L (ref 0–40)
Albumin: 4.1 g/dL (ref 3.9–4.9)
Alkaline Phosphatase: 56 [IU]/L (ref 44–121)
BUN/Creatinine Ratio: 12 (ref 9–23)
BUN: 8 mg/dL (ref 6–24)
Bilirubin Total: 1.1 mg/dL (ref 0.0–1.2)
CO2: 24 mmol/L (ref 20–29)
Calcium: 9 mg/dL (ref 8.7–10.2)
Chloride: 103 mmol/L (ref 96–106)
Creatinine, Ser: 0.68 mg/dL (ref 0.57–1.00)
Globulin, Total: 2.2 g/dL (ref 1.5–4.5)
Glucose: 81 mg/dL (ref 70–99)
Potassium: 3.9 mmol/L (ref 3.5–5.2)
Sodium: 142 mmol/L (ref 134–144)
Total Protein: 6.3 g/dL (ref 6.0–8.5)
eGFR: 109 mL/min/{1.73_m2} (ref >59)

## 2023-04-12 LAB — VITAMIN B12: Vitamin B-12: 325 pg/mL (ref 232–1245)

## 2023-04-12 LAB — HEMOGLOBIN A1C
Est. average glucose Bld gHb Est-mCnc: 111 mg/dL
Hgb A1c MFr Bld: 5.5 % (ref 4.8–5.6)

## 2023-04-12 LAB — MAGNESIUM: Magnesium: 1.9 mg/dL (ref 1.6–2.3)

## 2023-04-12 LAB — TSH: TSH: 1.15 u[IU]/mL (ref 0.450–4.500)

## 2023-04-12 LAB — POCT URINE PREGNANCY: Preg Test, Ur: NEGATIVE

## 2023-04-12 LAB — VITAMIN D 25 HYDROXY (VIT D DEFICIENCY, FRACTURES): Vit D, 25-Hydroxy: 34.9 ng/mL (ref 30.0–100.0)

## 2023-04-12 MED ORDER — ACETAMINOPHEN 500 MG PO CAPS
1000.0000 mg | ORAL_CAPSULE | Freq: Once | ORAL | Status: DC
Start: 2023-04-12 — End: 2023-04-12

## 2023-04-12 MED ORDER — ACETAMINOPHEN 500 MG PO TABS
1000.0000 mg | ORAL_TABLET | Freq: Once | ORAL | Status: AC
Start: 2023-04-12 — End: 2023-04-12
  Administered 2023-04-12: 1000 mg via ORAL

## 2023-04-12 NOTE — Patient Instructions (Signed)
Colposcopy, Care After  The following information offers guidance on how to care for yourself after your procedure. Your doctor may also give you more specific instructions. If you have problems or questions, contact your doctor. What can I expect after the procedure? If you did not have a sample of your tissue taken out (did not have a biopsy), you may only have some spotting of blood for a few days. You can go back to your normal activities. If you had a sample of your tissue taken out, it is common to have: Soreness and mild pain. These may last for a few days. Mild bleeding or fluid (discharge) coming from your vagina. The fluid will look dark and grainy. You may have this for a few days. The fluid may be caused by a liquid that was used during your procedure. You may need to wear a sanitary pad. Spotting of blood for at least 48 hours after the procedure. Follow these instructions at home: Medicines Take over-the-counter and prescription medicines only as told by your doctor. Ask your doctor what over-the-counter pain medicines and prescription medicines you can start taking again. This is very important if you take blood thinners. Activity For at least 3 days, or for as long as told by your doctor, avoid: Douching. Using tampons. Having sex. Return to your normal activities as told by your doctor. Ask your doctor what activities are safe for you. General instructions Ask your doctor if you may take baths, swim, or use a hot tub. You may take showers. If you use birth control (contraception), keep using it. Keep all follow-up visits. Contact a doctor if: You have a fever or chills. You faint or feel light-headed. Get help right away if: You bleed a lot from your vagina. A lot of bleeding means that the bleeding soaks through a pad in less than 1 hour. You have clumps of blood (blood clots) coming from your vagina. You have signs that could mean you have an infection. This may be  fluid coming from your vagina that is: Different than normal. Yellow. Bad-smelling. You have very bad pain or cramps in your lower belly that do not get better with medicine. Summary If you did not have a sample of your tissue taken out, you may only have some spotting of blood for a few days. You can go back to your normal activities. If you had a sample of your tissue taken out, it is common to have mild pain for a few days and spotting for 48 hours. Avoid douching, using tampons, and having sex for at least 3 days after the procedure or for as long as told. Get help right away if you have a lot of bleeding, very bad pain, or signs of infection. This information is not intended to replace advice given to you by your health care provider. Make sure you discuss any questions you have with your health care provider. Document Revised: 10/24/2020 Document Reviewed: 10/24/2020 Elsevier Patient Education  2024 [employer]  

## 2023-04-12 NOTE — Progress Notes (Signed)
Patient ID: Karen Brennan, female   DOB: 1975/08/25, 47 y.o.   MRN: [id number]  Chief Complaint  Patient presents with   Abnormal Pap Smear    HPI Karen Brennan is a 47 y.o. female presents for colposcopy today s/p +HPV on pap with normal cytology. Previous history of normal colpo in December 2016. Recalls having another colpo + biopsy in the past but could not remember when, but had no treatment performed. No other concerns today.   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  Previous colposcopy: 05/2015 which was normal.  Prior cervical treatment: no treatment.  Past Medical History:  Diagnosis Date   ANEMIA 08/14/2008   Qualifier: Diagnosis of  By: Karn Pickler MD, Jessica     Anxiety    Asthma    B12 deficiency 08/29/2021   Bipolar 1 disorder (HCC)    Blood transfusion    Cellulitis 07/15/2020   Of right buttock   Chest pain    Depression    Edema of both lower extremities    GERD (gastroesophageal reflux disease)    History of bronchitis    Hypertension    Kidney stones 10/09/2011   Migraines 10/09/2011   "often"   Morbid obesity with BMI of 50.0-59.9, adult (HCC) 08/09/2006   Qualifier: Diagnosis of   By: Karn Pickler MD, Jessica         Pre-diabetes    Schizophrenia (HCC)    SOB (shortness of breath)    Vitamin D deficiency 08/29/2021    Past Surgical History:  Procedure Laterality Date   ANKLE FRACTURE SURGERY   1990's   "left; put screws in"   ENDOMETRIAL ABLATION  ~ 2011   FRACTURE SURGERY     GASTRIC BY PASS     TUBAL LIGATION  2006    Family History  Problem Relation Age of Onset   Heart failure Mother    Hyperlipidemia Mother    Hypertension Mother    Diabetes Mother    Sudden death Mother    Depression Mother    Bipolar disorder Mother    Schizophrenia Mother    Hypertension Father    Colon cancer Neg Hx    Esophageal cancer Neg Hx    Rectal cancer Neg Hx    Stomach cancer Neg Hx     Social  History Social History   Tobacco Use   Smoking status: Former    Current packs/day: 0.00    Average packs/day: 0.3 packs/day for 4.0 years (1.0 ttl pk-yrs)    Types: Cigarettes    Start date: 06/12/2000    Quit date: 06/12/2004    Years since quitting: 18.8   Smokeless tobacco: Never  Vaping Use   Vaping status: Never Used  Substance Use Topics   Alcohol use: No   Drug use: No    Allergies  Allergen Reactions   Sibutramine Hcl Monohydrate Hives    Current Outpatient Medications  Medication Sig Dispense Refill   Accu-Chek Softclix Lancets lancets USE TO test fasting blood sugar EVERY DAY IN THE MORNING 100 each 6   albuterol (VENTOLIN HFA) 108 (90 Base) MCG/ACT inhaler inhale 2 puffs BY MOUTH EVERY 4 HOURS AS NEEDED FOR wheezing OR SHORTNESS OF BREATH 18 g 1   atorvastatin (LIPITOR) 20 MG tablet Take 1 tablet (20 mg total) by mouth daily. 90 tablet 3   benztropine (COGENTIN) 1 MG tablet Take 1 mg by mouth 2 (two) times daily.  blood glucose meter kit and supplies KIT Dispense based on patient and insurance preference. Use up to four times daily as directed. (FOR ICD-9 250.00, 250.01). 1 each 0   Blood Glucose Monitoring Suppl DEVI 1 each by Does not apply route 2 (two) times daily. May substitute to any manufacturer covered by patient's insurance. check sugars fasting and post-prandial daily. 1 each 0   busPIRone (BUSPAR) 7.5 MG tablet Take 7.5 mg by mouth 2 (two) times daily.     clotrimazole (LOTRIMIN) 1 % cream APPLY TO AFFECTED AREA TWICE A DAY 30 g 0   cyanocobalamin (VITAMIN B12) 1000 MCG tablet Take 1 tablet (1,000 mcg total) by mouth daily.     ferrous sulfate 325 (65 FE) MG tablet Take 325 mg by mouth in the morning and at bedtime.     FLUoxetine (PROZAC) 40 MG capsule Take 40 mg by mouth daily.     fluticasone (FLONASE) 50 MCG/ACT nasal spray place 2 SPRAYS in each nostril DAILY AS NEEDED FOR allergies OR RHINITIS 16 g 0   gabapentin (NEURONTIN) 300 MG capsule Take 1  capsule (300 mg total) by mouth 3 (three) times daily. 90 capsule 1   GOODSENSE ALLERGY RELIEF 10 MG tablet TAKE 1 Tablet BY MOUTH ONCE DAILY 90 tablet 0   guaiFENesin (MUCINEX) 600 MG 12 hr tablet Take by mouth 2 (two) times daily.     haloperidol (HALDOL) 2 MG tablet Take 2 mg by mouth daily.     hydrochlorothiazide (HYDRODIURIL) 12.5 MG tablet Take 1 tablet (12.5 mg total) by mouth daily. 90 tablet 3   hydrOXYzine (VISTARIL) 25 MG capsule Take 25 mg by mouth every evening. Every evening as needed     linaclotide (LINZESS) 72 MCG capsule Take 1 capsule (72 mcg total) by mouth daily before breakfast. 90 capsule 0   meloxicam (MOBIC) 15 MG tablet Take 1 tablet (15 mg total) by mouth daily. 30 tablet 2   metFORMIN (GLUCOPHAGE) 1000 MG tablet Take 1 tablet (1,000 mg total) by mouth 2 (two) times daily with a meal. 180 tablet 0   metoprolol tartrate (LOPRESSOR) 25 MG tablet TAKE ONE-HALF TABLET BY MOUTH TWICE DAILY AS NEEDED 90 tablet 3   nystatin (MYCOSTATIN/NYSTOP) powder Apply 1 Application topically 3 (three) times daily. 15 g 0   OLANZapine (ZYPREXA) 20 MG tablet Take 20 mg by mouth at bedtime.      ondansetron (ZOFRAN) 4 MG tablet Take 1 tablet (4 mg total) by mouth every 8 (eight) hours as needed for nausea or vomiting. 20 tablet 0   Prenatal Vit-Fe Fum-FA-Omega (PNV PRENATAL PLUS MULTIVIT+DHA) 27-1 & 312 MG MISC 1 po qd 90 each 0   Probiotic Product (ACIDOPHILUS PROBIOTIC BLEND PO) Take by mouth.     Semaglutide, 1 MG/DOSE, 4 MG/3ML SOPN Inject 1 mg as directed once a week. 3 mL 0   traZODone (DESYREL) 50 MG tablet Take 50 mg by mouth at bedtime as needed for sleep.     valsartan (DIOVAN) 80 MG tablet Take 1 tablet (80 mg total) by mouth at bedtime. 90 tablet 3   Vitamin D, Ergocalciferol, (DRISDOL) 1.25 MG (50000 UNIT) CAPS capsule 1 tab q wed and 1 tab q sun 8 capsule 0   No current facility-administered medications for this visit.   Blood pressure 132/84, pulse 86, height 6\' 1"  (1.854  m), weight (!) 337 lb 6.4 oz (153 kg), SpO2 97%.  Physical Exam General: NAD, awake and alert HEENT: Normocephalic, atraumatic. Conjunctiva normal.  Extremities: No BLE edema, no deformities or significant joint findings. Peripheral pulses intact.  Skin: Warm and dry. Neuro: No focal neurological deficits. F GU: Normally developed genitalia with no external lesions or eruptions. Vagina and cervix show no lesions, inflammation, discharge or tenderness. No cystocele.  Data Reviewed Pap result 02/16/23 reviewed today - + high risk HPV, normal cytology Colposcopy 05/27/15 reviewed today - normal  Assessment    Procedure Details  The risks and benefits of the procedure and Written informed consent obtained.  Speculum placed in vagina and excellent visualization of cervix achieved, cervix swabbed x 3 with acetic acid solution.  Specimens: Endocervical pathology and Surgical pathology (12 o'clock)  Complications: none.     Plan    Specimens labeled and sent to Pathology. Return to discuss Pathology results in 2 weeks. Tylenol 1000 mg given in clinic to moderate pain after procedure.     Fortunato Curling 04/12/2023, 10:18 AM

## 2023-04-13 LAB — SURGICAL PATHOLOGY

## 2023-04-14 ENCOUNTER — Telehealth: Payer: Self-pay | Admitting: Family Medicine

## 2023-04-14 DIAGNOSIS — R87613 High grade squamous intraepithelial lesion on cytologic smear of cervix (HGSIL): Secondary | ICD-10-CM

## 2023-04-14 DIAGNOSIS — R85613 High grade squamous intraepithelial lesion on cytologic smear of anus (HGSIL): Secondary | ICD-10-CM

## 2023-04-14 NOTE — Telephone Encounter (Signed)
I called and discussed test result. She confirmed her name and DOB Cervical biopsy shows HSIL - Immediate treatment with LEEP discussed All questions were answered She agreed with referral to Gyn for treatment  Colposcopy Report FINAL DIAGNOSIS        1. Cervix, biopsy, 12 o'clock :       - HIGH-GRADE SQUAMOUS INTRAEPITHELIAL LESION (CIN2, HIGH GRADE DYSPLASIA)        2. Endocervix, curettage,  :       - BENIGN CERVICAL GLANDULAR MUCOSA       - NEGATIVE FOR DYSPLASIA OR MALIGNANCY

## 2023-04-16 ENCOUNTER — Encounter: Payer: Self-pay | Admitting: Internal Medicine

## 2023-04-16 ENCOUNTER — Ambulatory Visit: Payer: MEDICAID | Admitting: Internal Medicine

## 2023-04-16 VITALS — BP 126/72 | HR 62 | Temp 97.4°F | Resp 15 | Ht 73.0 in | Wt 340.0 lb

## 2023-04-16 DIAGNOSIS — Z1211 Encounter for screening for malignant neoplasm of colon: Secondary | ICD-10-CM

## 2023-04-16 DIAGNOSIS — I493 Ventricular premature depolarization: Secondary | ICD-10-CM | POA: Diagnosis not present

## 2023-04-16 DIAGNOSIS — I10 Essential (primary) hypertension: Secondary | ICD-10-CM | POA: Diagnosis not present

## 2023-04-16 DIAGNOSIS — D123 Benign neoplasm of transverse colon: Secondary | ICD-10-CM

## 2023-04-16 DIAGNOSIS — E119 Type 2 diabetes mellitus without complications: Secondary | ICD-10-CM | POA: Diagnosis not present

## 2023-04-16 MED ORDER — SODIUM CHLORIDE 0.9 % IV SOLN: 500.0000 mL | Freq: Once | INTRAVENOUS | Status: DC

## 2023-04-16 NOTE — Progress Notes (Signed)
Pt's states no medical or surgical changes since previsit or office visit. 

## 2023-04-16 NOTE — Progress Notes (Signed)
Pt resting comfortably. VSS. Airway intact. SBAR complete to RN. All questions answered.   

## 2023-04-16 NOTE — Progress Notes (Signed)
GASTROENTEROLOGY PROCEDURE H&P NOTE   Primary Care Physician: Lorayne Bender, MD    Reason for Procedure:   Colon cancer screening  Plan:    Colonoscopy  Patient is appropriate for endoscopic procedure(s) in the ambulatory ([facility]) setting.  The nature of the procedure, as well as the risks, benefits, and alternatives were carefully and thoroughly reviewed with the patient. Ample time for discussion and questions allowed. The patient understood, was satisfied, and agreed to proceed.     HPI: Karen Brennan is a 47 y.o. female who presents for colonoscopy for colon cancer screening. Denies blood in stools, changes in bowel habits, or unintentional weight loss. Denies family history of colon cancer.  Past Medical History:  Diagnosis Date   ANEMIA 08/14/2008   Qualifier: Diagnosis of  By: Karn Pickler MD, Jessica     Anxiety    Asthma    B12 deficiency 08/29/2021   Bipolar 1 disorder (HCC)    Blood transfusion    Cellulitis 07/15/2020   Of right buttock   Chest pain    Depression    Edema of both lower extremities    GERD (gastroesophageal reflux disease)    History of bronchitis    Hypertension    Kidney stones 10/09/2011   Migraines 10/09/2011   "often"   Morbid obesity with BMI of 50.0-59.9, adult (HCC) 08/09/2006   Qualifier: Diagnosis of   By: Karn Pickler MD, Jessica         Pre-diabetes    Schizophrenia (HCC)    SOB (shortness of breath)    Vitamin D deficiency 08/29/2021    Past Surgical History:  Procedure Laterality Date   ANKLE FRACTURE SURGERY   1990's   "left; put screws in"   ENDOMETRIAL ABLATION  ~ 2011   FRACTURE SURGERY     GASTRIC BY PASS     TUBAL LIGATION  2006    Prior to Admission medications   Medication Sig Start Date End Date Taking? Authorizing Provider  Accu-Chek Softclix Lancets lancets USE TO test fasting blood sugar EVERY DAY IN THE MORNING 01/05/22  Yes Hensel, Santiago Bumpers, MD  atorvastatin (LIPITOR) 20 MG tablet Take 1 tablet (20 mg total) by  mouth daily. 10/02/22  Yes Wittenborn, Gavin Pound, NP  benztropine (COGENTIN) 1 MG tablet Take 1 mg by mouth 2 (two) times daily. 06/18/19  Yes [provider]  blood glucose meter kit and supplies KIT Dispense based on patient and insurance preference. Use up to four times daily as directed. (FOR ICD-9 250.00, 250.01). 09/06/21  Yes Littie Deeds, MD  Blood Glucose Monitoring Suppl DEVI 1 each by Does not apply route 2 (two) times daily. May substitute to any manufacturer covered by patient's insurance. check sugars fasting and post-prandial daily. 09/13/22  Yes Opalski, Gavin Pound, DO  busPIRone (BUSPAR) 7.5 MG tablet Take 7.5 mg by mouth 2 (two) times daily. 11/15/21  Yes [provider]  FLUoxetine (PROZAC) 40 MG capsule Take 40 mg by mouth daily.   Yes [provider]  guaiFENesin (MUCINEX) 600 MG 12 hr tablet Take by mouth 2 (two) times daily.   Yes [provider]  haloperidol (HALDOL) 2 MG tablet Take 2 mg by mouth daily.   Yes [provider]  hydrochlorothiazide (HYDRODIURIL) 12.5 MG tablet Take 1 tablet (12.5 mg total) by mouth daily. 10/02/22  Yes Wittenborn, Gavin Pound, NP  hydrOXYzine (VISTARIL) 25 MG capsule Take 25 mg by mouth every evening. Every evening as needed 10/24/20  Yes [provider]  linaclotide (LINZESS) 72 MCG capsule Take 1 capsule (72 mcg total) by mouth daily before breakfast. 04/11/23  Yes Opalski, Gavin Pound, DO  metFORMIN (GLUCOPHAGE) 1000 MG tablet Take 1 tablet (1,000 mg total) by mouth 2 (two) times daily with a meal. 04/11/23  Yes Opalski, Deborah, DO  metoprolol tartrate (LOPRESSOR) 25 MG tablet TAKE ONE-HALF TABLET BY MOUTH TWICE DAILY AS NEEDED 10/02/22  Yes Wittenborn, Gavin Pound, NP  OLANZapine (ZYPREXA) 20 MG tablet Take 20 mg by mouth at bedtime.    Yes [provider]  traZODone (DESYREL) 50 MG tablet Take 50 mg by mouth at bedtime as needed for sleep. 04/07/19  Yes [provider]  valsartan (DIOVAN) 80 MG  tablet Take 1 tablet (80 mg total) by mouth at bedtime. 10/02/22  Yes Wittenborn, Gavin Pound, NP  albuterol (VENTOLIN HFA) 108 (90 Base) MCG/ACT inhaler inhale 2 puffs BY MOUTH EVERY 4 HOURS AS NEEDED FOR wheezing OR SHORTNESS OF BREATH 12/07/22   Littie Deeds, MD  clotrimazole (LOTRIMIN) 1 % cream APPLY TO AFFECTED AREA TWICE A DAY 02/16/23   Hindel, Leah, MD  cyanocobalamin (VITAMIN B12) 1000 MCG tablet Take 1 tablet (1,000 mcg total) by mouth daily. 04/11/23   Thomasene Lot, DO  ferrous sulfate 325 (65 FE) MG tablet Take 325 mg by mouth in the morning and at bedtime. 08/25/21   [provider]  fluticasone (FLONASE) 50 MCG/ACT nasal spray place 2 SPRAYS in each nostril DAILY AS NEEDED FOR allergies OR RHINITIS 12/07/22   Littie Deeds, MD  gabapentin (NEURONTIN) 300 MG capsule Take 1 capsule (300 mg total) by mouth 3 (three) times daily. 04/11/23   Lorayne Bender, MD  GOODSENSE ALLERGY RELIEF 10 MG tablet TAKE 1 Tablet BY MOUTH ONCE DAILY 12/07/22   Littie Deeds, MD  meloxicam (MOBIC) 15 MG tablet Take 1 tablet (15 mg total) by mouth daily. 03/27/23   Brenton Grills, MD  nystatin (MYCOSTATIN/NYSTOP) powder Apply 1 Application topically 3 (three) times daily. 02/16/23   Hindel, Tacey Ruiz, MD  ondansetron (ZOFRAN) 4 MG tablet Take 1 tablet (4 mg total) by mouth every 8 (eight) hours as needed for nausea or vomiting. 09/12/22   Valetta Close, MD  Prenatal Vit-Fe Fum-FA-Omega (PNV PRENATAL PLUS MULTIVIT+DHA) 27-1 & 312 MG MISC 1 po qd 04/11/23   Thomasene Lot, DO  Probiotic Product (ACIDOPHILUS PROBIOTIC BLEND PO) Take by mouth.    [provider]  Semaglutide, 1 MG/DOSE, 4 MG/3ML SOPN Inject 1 mg as directed once a week. 04/11/23   Thomasene Lot, DO  Vitamin D, Ergocalciferol, (DRISDOL) 1.25 MG (50000 UNIT) CAPS capsule 1 tab q wed and 1 tab q sun 04/11/23   Thomasene Lot, DO    Current Outpatient Medications  Medication Sig Dispense Refill   Accu-Chek Softclix Lancets lancets USE TO test  fasting blood sugar EVERY DAY IN THE MORNING 100 each 6   atorvastatin (LIPITOR) 20 MG tablet Take 1 tablet (20 mg total) by mouth daily. 90 tablet 3   benztropine (COGENTIN) 1 MG tablet Take 1 mg by mouth 2 (two) times daily.     blood glucose meter kit and supplies KIT Dispense based on patient and insurance preference. Use up to four times daily as directed. (FOR ICD-9 250.00, 250.01). 1 each 0   Blood Glucose Monitoring Suppl DEVI 1 each by Does not apply route 2 (two) times daily. May substitute to any manufacturer covered by patient's insurance. check sugars fasting and post-prandial daily. 1 each 0   busPIRone (BUSPAR) 7.5  MG tablet Take 7.5 mg by mouth 2 (two) times daily.     FLUoxetine (PROZAC) 40 MG capsule Take 40 mg by mouth daily.     guaiFENesin (MUCINEX) 600 MG 12 hr tablet Take by mouth 2 (two) times daily.     haloperidol (HALDOL) 2 MG tablet Take 2 mg by mouth daily.     hydrochlorothiazide (HYDRODIURIL) 12.5 MG tablet Take 1 tablet (12.5 mg total) by mouth daily. 90 tablet 3   hydrOXYzine (VISTARIL) 25 MG capsule Take 25 mg by mouth every evening. Every evening as needed     linaclotide (LINZESS) 72 MCG capsule Take 1 capsule (72 mcg total) by mouth daily before breakfast. 90 capsule 0   metFORMIN (GLUCOPHAGE) 1000 MG tablet Take 1 tablet (1,000 mg total) by mouth 2 (two) times daily with a meal. 180 tablet 0   metoprolol tartrate (LOPRESSOR) 25 MG tablet TAKE ONE-HALF TABLET BY MOUTH TWICE DAILY AS NEEDED 90 tablet 3   OLANZapine (ZYPREXA) 20 MG tablet Take 20 mg by mouth at bedtime.      traZODone (DESYREL) 50 MG tablet Take 50 mg by mouth at bedtime as needed for sleep.     valsartan (DIOVAN) 80 MG tablet Take 1 tablet (80 mg total) by mouth at bedtime. 90 tablet 3   albuterol (VENTOLIN HFA) 108 (90 Base) MCG/ACT inhaler inhale 2 puffs BY MOUTH EVERY 4 HOURS AS NEEDED FOR wheezing OR SHORTNESS OF BREATH 18 g 1   clotrimazole (LOTRIMIN) 1 % cream APPLY TO AFFECTED AREA TWICE  A DAY 30 g 0   cyanocobalamin (VITAMIN B12) 1000 MCG tablet Take 1 tablet (1,000 mcg total) by mouth daily.     ferrous sulfate 325 (65 FE) MG tablet Take 325 mg by mouth in the morning and at bedtime.     fluticasone (FLONASE) 50 MCG/ACT nasal spray place 2 SPRAYS in each nostril DAILY AS NEEDED FOR allergies OR RHINITIS 16 g 0   gabapentin (NEURONTIN) 300 MG capsule Take 1 capsule (300 mg total) by mouth 3 (three) times daily. 90 capsule 1   GOODSENSE ALLERGY RELIEF 10 MG tablet TAKE 1 Tablet BY MOUTH ONCE DAILY 90 tablet 0   meloxicam (MOBIC) 15 MG tablet Take 1 tablet (15 mg total) by mouth daily. 30 tablet 2   nystatin (MYCOSTATIN/NYSTOP) powder Apply 1 Application topically 3 (three) times daily. 15 g 0   ondansetron (ZOFRAN) 4 MG tablet Take 1 tablet (4 mg total) by mouth every 8 (eight) hours as needed for nausea or vomiting. 20 tablet 0   Prenatal Vit-Fe Fum-FA-Omega (PNV PRENATAL PLUS MULTIVIT+DHA) 27-1 & 312 MG MISC 1 po qd 90 each 0   Probiotic Product (ACIDOPHILUS PROBIOTIC BLEND PO) Take by mouth.     Semaglutide, 1 MG/DOSE, 4 MG/3ML SOPN Inject 1 mg as directed once a week. 3 mL 0   Vitamin D, Ergocalciferol, (DRISDOL) 1.25 MG (50000 UNIT) CAPS capsule 1 tab q wed and 1 tab q sun 8 capsule 0   Current Facility-Administered Medications  Medication Dose Route Frequency Provider Last Rate Last Admin   0.9 %  sodium chloride infusion  500 mL Intravenous Once Imogene Burn, MD        Allergies as of 04/16/2023 - Review Complete 04/16/2023  Allergen Reaction Noted   Sibutramine hcl monohydrate Hives 10/05/2011    Family History  Problem Relation Age of Onset   Heart failure Mother    Hyperlipidemia Mother    Hypertension Mother  Diabetes Mother    Sudden death Mother    Depression Mother    Bipolar disorder Mother    Schizophrenia Mother    Hypertension Father    Colon cancer Neg Hx    Esophageal cancer Neg Hx    Rectal cancer Neg Hx    Stomach cancer Neg Hx      Social History   Socioeconomic History   Marital status: Single    Spouse name: Not on file   Number of children: Not on file   Years of education: Not on file   Highest education level: Not on file  Occupational History   Occupation: DISABLE  Tobacco Use   Smoking status: Former    Current packs/day: 0.00    Average packs/day: 0.3 packs/day for 4.0 years (1.0 ttl pk-yrs)    Types: Cigarettes    Start date: 06/12/2000    Quit date: 06/12/2004    Years since quitting: 18.8   Smokeless tobacco: Never  Vaping Use   Vaping status: Never Used  Substance and Sexual Activity   Alcohol use: No   Drug use: No   Sexual activity: Yes    Birth control/protection: None    Comment: Novasure  Other Topics Concern   Not on file  Social History Narrative   Not on file   Social Determinants of Health   Financial Resource Strain: Not on file  Food Insecurity: Not on file  Transportation Needs: Not on file  Physical Activity: Not on file  Stress: Not on file  Social Connections: Not on file  Intimate Partner Violence: Not on file    Physical Exam: Vital signs in last 24 hours: BP 133/88   Pulse 67   Temp (!) 97.4 F (36.3 C)   Ht 6\' 1"  (1.854 m)   Wt (!) 340 lb (154.2 kg)   LMP  (LMP Unknown)   SpO2 99%   BMI 44.86 kg/m  GEN: NAD EYE: Sclerae anicteric ENT: MMM CV: Non-tachycardic Pulm: No increased work of breathing GI: Soft, NT/ND NEURO:  Alert & Oriented   Eulah Pont, MD [facility]  04/16/2023 11:18 AM

## 2023-04-16 NOTE — Patient Instructions (Addendum)
Resume previous diet Continue present medications Await pathology results on polyps removed  Handouts on polyps & hemorrhoids given to you today   YOU HAD AN ENDOSCOPIC PROCEDURE TODAY AT THE [facility]:   Refer to the procedure report that was given to you for any specific questions about what was found during the examination.  If the procedure report does not answer your questions, please call your gastroenterologist to clarify.  If you requested that your care partner not be given the details of your procedure findings, then the procedure report has been included in a sealed envelope for you to review at your convenience later.  YOU SHOULD EXPECT: Some feelings of bloating in the abdomen. Passage of more gas than usual.  Walking can help get rid of the air that was put into your GI tract during the procedure and reduce the bloating. If you had a lower endoscopy (such as a colonoscopy or flexible sigmoidoscopy) you may notice spotting of blood in your stool or on the toilet paper. If you underwent a bowel prep for your procedure, you may not have a normal bowel movement for a few days.  Please Note:  You might notice some irritation and congestion in your nose or some drainage.  This is from the oxygen used during your procedure.  There is no need for concern and it should clear up in a day or so.  SYMPTOMS TO REPORT IMMEDIATELY:  Following lower endoscopy (colonoscopy or flexible sigmoidoscopy):  Excessive amounts of blood in the stool  Significant tenderness or worsening of abdominal pains  Swelling of the abdomen that is new, acute  Fever of 100F or higher  For urgent or emergent issues, a gastroenterologist can be reached at any hour by calling [phone]. Do not use MyChart messaging for urgent concerns.    DIET:  We do recommend a small meal at first, but then you may proceed to your regular diet.  Drink plenty of fluids but you should avoid alcoholic beverages for  24 hours.  ACTIVITY:  You should plan to take it easy for the rest of today and you should NOT DRIVE or use heavy machinery until tomorrow (because of the sedation medicines used during the test).    FOLLOW UP: Our staff will call the number listed on your records the next business day following your procedure.  We will call around 7:15- 8:00 am to check on you and address any questions or concerns that you may have regarding the information given to you following your procedure. If we do not reach you, we will leave a message.     If any biopsies were taken you will be contacted by phone or by letter within the next 1-3 weeks.  Please call us at [phone] if you have not heard about the biopsies in 3 weeks.    SIGNATURES/CONFIDENTIALITY: You and/or your care partner have signed paperwork which will be entered into your electronic medical record.  These signatures attest to the fact that that the information above on your After Visit Summary has been reviewed and is understood.  Full responsibility of the confidentiality of this discharge information lies with you and/or your care-partner.

## 2023-04-16 NOTE — Op Note (Signed)
[facility] Patient Name: Karen Brennan Procedure Date: 04/16/2023 11:32 AM MRN: [id number] Endoscopist: Madelyn Brunner [name] , , [phone] Age: 47 Referring MD:  Date of Birth: [birth year] Gender: Female Account #: [id number] Procedure:                Colonoscopy Indications:              Screening for colorectal malignant neoplasm, This                            is the patient's first colonoscopy Medicines:                Monitored Anesthesia Care Procedure:                Pre-Anesthesia Assessment:                           - Prior to the procedure, a History and Physical                            was performed, and patient medications and                            allergies were reviewed. The patient's tolerance of                            previous anesthesia was also reviewed. The risks                            and benefits of the procedure and the sedation                            options and risks were discussed with the patient.                            All questions were answered, and informed consent                            was obtained. Prior Anticoagulants: The patient has                            taken no anticoagulant or antiplatelet agents. ASA                            Grade Assessment: II - A patient with mild systemic                            disease. After reviewing the risks and benefits,                            the patient was deemed in satisfactory condition to                            undergo the procedure.  After obtaining informed consent, the colonoscope                            was passed under direct vision. Throughout the                            procedure, the patient's blood pressure, pulse, and                            oxygen saturations were monitored continuously. The                            CF [id number] #[id number] was introduced through the anus                            and advanced to  the the terminal ileum. The                            colonoscopy was performed without difficulty. The                            patient tolerated the procedure well. The quality                            of the bowel preparation was good. The terminal                            ileum, ileocecal valve, appendiceal orifice, and                            rectum were photographed. Scope In: 11:34:56 AM Scope Out: 11:58:36 AM Scope Withdrawal Time: 0 hours 15 minutes 53 seconds  Total Procedure Duration: 0 hours 23 minutes 40 seconds  Findings:                 The terminal ileum appeared normal.                           Two sessile polyps were found in the transverse                            colon. The polyps were 3 to 5 mm in size. These                            polyps were removed with a cold snare. Resection                            and retrieval were complete.                           Non-bleeding internal hemorrhoids were found during                            retroflexion. Complications:  No immediate complications. Estimated Blood Loss:     Estimated blood loss was minimal. Impression:               - The examined portion of the ileum was normal.                           - Two 3 to 5 mm polyps in the transverse colon,                            removed with a cold snare. Resected and retrieved.                           - Non-bleeding internal hemorrhoids. Recommendation:           - Discharge patient to home (with escort).                           - Await pathology results.                           - The findings and recommendations were discussed                            with the patient. Dr Particia Lather "Alan Ripper" Leonides Schanz,  04/16/2023 12:04:10 PM

## 2023-04-17 ENCOUNTER — Telehealth: Payer: Self-pay

## 2023-04-17 NOTE — Telephone Encounter (Signed)
Incorrect ° °

## 2023-04-17 NOTE — Telephone Encounter (Signed)
  Follow up Call-     04/16/2023   10:31 AM  Call back number  Post procedure Call Back phone  # [phone]  Permission to leave phone message Yes    Attempted to call patient x2, no answer. Voicemail service not available.

## 2023-04-18 ENCOUNTER — Encounter: Payer: Self-pay | Admitting: Internal Medicine

## 2023-04-18 LAB — SURGICAL PATHOLOGY

## 2023-04-19 ENCOUNTER — Other Ambulatory Visit (HOSPITAL_COMMUNITY)
Admission: RE | Admit: 2023-04-19 | Discharge: 2023-04-19 | Disposition: A | Discharge status: Home / Self Care | Payer: MEDICAID | Source: Ambulatory Visit | Attending: Family Medicine | Admitting: Family Medicine

## 2023-04-19 ENCOUNTER — Telehealth: Payer: Self-pay

## 2023-04-19 ENCOUNTER — Encounter: Payer: Self-pay | Admitting: Family Medicine

## 2023-04-19 ENCOUNTER — Ambulatory Visit: Payer: MEDICAID | Admitting: Family Medicine

## 2023-04-19 VITALS — BP 126/78 | HR 99 | Temp 98.3°F | Wt 337.2 lb

## 2023-04-19 DIAGNOSIS — R109 Unspecified abdominal pain: Secondary | ICD-10-CM | POA: Insufficient documentation

## 2023-04-19 LAB — POCT WET PREP (WET MOUNT)
Clue Cells Wet Prep Whiff POC: NEGATIVE
Trichomonas Wet Prep HPF POC: ABSENT

## 2023-04-19 MED ORDER — DOXYCYCLINE HYCLATE 100 MG PO TABS: 100.0000 mg | ORAL_TABLET | Freq: Two times a day (BID) | ORAL | 0 refills | Status: AC

## 2023-04-19 MED ORDER — CEFTRIAXONE SODIUM 1 G IJ SOLR
1.0000 g | Freq: Once | INTRAMUSCULAR | Status: AC
Start: 2023-04-19 — End: 2023-04-19
  Administered 2023-04-19: 1 g via INTRAMUSCULAR

## 2023-04-19 NOTE — Progress Notes (Addendum)
    SUBJECTIVE:   CHIEF COMPLAINT / HPI:   Stomach pain and chills Recently had a colposcopy done on 10/31 for abnormal Pap which came back at [facility] (CIN-2, high-grade dysplasia).  Procedure note mentioned moderate bleeding ultimately controlled with Monsel solution. On 11/2, patient started having chills.  She notes discharge at this time but was more black.  On 11/3, patient started having abdominal cramping.  She also states she had a colonoscopy on 11/4.  However, her abdominal cramping has continued past this, and this morning 11/7, she started to notice bleeding and some white discharge.  While her cramping and spotting can be normal for her in the setting of her previous endometrial ablation, she is worried since he just had this procedure done.  She has been taking ibuprofen and Tylenol so is unsure if she had fevers.  She has been nauseous but has no vomiting.  No bowel changes.  No urinary changes or dysuria.  Last sexually active years ago.  PERTINENT  PMH / PSH: Hypertension, hyperlipidemia, type 2 diabetes, GERD, chronic constipation, history of Roux-en-Y gastric bypass, bipolar 1 disorder, anxiety  OBJECTIVE:   BP 126/78   Pulse 99   Temp 98.3 F (36.8 C)   Wt (!) 337 lb 3.2 oz (153 kg)   LMP  (LMP Unknown)   SpO2 99%   BMI 44.49 kg/m   General: Alert and oriented, in NAD Skin: Warm, dry, and intact HEENT: NCAT, EOM grossly normal, midline nasal septum Cardiac: RRR, no m/r/g appreciated Respiratory: CTAB, breathing and speaking comfortably on RA Abdominal: Soft, nontender, nondistended, normoactive bowel sounds Extremities: Moves all extremities grossly equally Neurological: No gross focal deficit Psychiatric: Appropriate mood and affect  Genitourinary: External vagina normal.  Cervix appreciated with post-colposcopic changes of irregular hyperpigmentation around the os, no frank discharge or pus appreciated though foul odor present, scant off-white discharge appreciated  with blood, cervical motion tenderness elicited on bimanual exam  ASSESSMENT/PLAN:   Abdominal cramping Symptoms and exam after colposcopy concerning for post-procedural infection.  Reassured by normal vitals today.  Collected GC/chlamydia and wet prep swabs to rule out specific pathogens. Patient unable to leave urine sample. Will empirically treat with doxycycline 100 mg twice daily for 7 days as well as ceftriaxone 1 g once IM here in the office.  Follow-up on Monday, 11/11 to ensure improvement or present to ED over the weekend if symptoms worsen before then.  Janeal Holmes, MD [facility]

## 2023-04-19 NOTE — Patient Instructions (Addendum)
We sent off cervical swabs to test for bacteria and yeast that could be causing your symptoms. We also sent off a urine sample. We gave you rocephin in the clinic. I have prescribed doxycycline. Come back on Monday to follow up or sooner to the ED if your pain or symptoms worsen.

## 2023-04-19 NOTE — Progress Notes (Signed)
Administered 1 gram of ceftriaxone at 1425 per orders from Dr. Phineas Real. Administered in RUOQ, site unremarkable, tolerated injection well.   Patient observed for 15 minutes post injection. No signs of adverse reaction noted.   Veronda Prude, RN

## 2023-04-20 LAB — CERVICOVAGINAL ANCILLARY ONLY
Chlamydia: NEGATIVE
Comment: NEGATIVE
Comment: NORMAL
Neisseria Gonorrhea: NEGATIVE

## 2023-04-23 ENCOUNTER — Ambulatory Visit (INDEPENDENT_AMBULATORY_CARE_PROVIDER_SITE_OTHER): Payer: MEDICAID | Admitting: Family Medicine

## 2023-04-23 VITALS — BP 115/80 | HR 85 | Ht 73.0 in | Wt 342.6 lb

## 2023-04-23 DIAGNOSIS — R109 Unspecified abdominal pain: Secondary | ICD-10-CM | POA: Diagnosis not present

## 2023-04-23 NOTE — Progress Notes (Signed)
    SUBJECTIVE:   CHIEF COMPLAINT / HPI:  Patients presents for follow up after being seen 11/07 for cramping and chills after colposcopy concerning for post procedural infection.   PERTINENT  PMH / PSH:  Reviewed and updated in patients chart   OBJECTIVE:   BP 115/80   Pulse 85   Ht 6\' 1"  (1.854 m)   Wt (!) 342 lb 9.6 oz (155.4 kg)   LMP  (LMP Unknown)   SpO2 99%   BMI 45.20 kg/m   General: A&O, NAD HEENT: No sign of trauma, EOM grossly intact Cardiac: RRR, no m/r/g Respiratory: CTAB, normal WOB, no w/c/r GI: Soft, NTTP, non-distended  Extremities: NTTP, no peripheral edema. Neuro: Normal gait, moves all four extremities appropriately. Psych: Appropriate mood and affect  ASSESSMENT/PLAN:   Abdominal Pain Patient presents for follow-up after being seen on 11/7 for abdominal pain and chills s/p colposcopy on 10/31.  She was started on doxycycline and and given ceftriaxone 1 g IM in the office for concern of post procedural infection.  She reports that she is doing much better and is not having any more symptoms.  She says that she has some mild cramping on and off but that is usual for her.  She denies any chills, fevers, abdominal pain.  She is asking about follow-up with gynecology for abnormal colposcopy results.  Discussed referral with Guadelupe Sabin. -Patient much improved, advised to finish antibiotic course -Return precautions given. -Contacted Guadelupe Sabin regarding referral to gynecology   Hal Morales, MD [facility]

## 2023-04-23 NOTE — Patient Instructions (Addendum)
It was wonderful to see you today.  Please bring ALL of your medications with you to every visit.   Today we talked about:  Your cramping and chills. I'm happy you're feeling better! Please complete your antibiotic course. If you have any new symptoms of abdominal pain, fevers, chills or other concerns, please call us or schedule an appointment.   Thank you for choosing [facility].   Please call [phone] with any questions about today's appointment.  Please arrive at least 15 minutes prior to your scheduled appointments.   If you had blood work today, I will send you a MyChart message or a letter if results are normal. Otherwise, I will give you a call.   If you had a referral placed, they will call you to set up an appointment. Please give [place] a call if you don't hear back in the next 2 weeks.   If you need additional refills before your next appointment, please call your pharmacy first.   Hal Morales, MD Family Medicine

## 2023-04-26 ENCOUNTER — Ambulatory Visit (INDEPENDENT_AMBULATORY_CARE_PROVIDER_SITE_OTHER): Payer: MEDICAID | Admitting: Family Medicine

## 2023-05-01 NOTE — Telephone Encounter (Signed)
Routed message to PCP. Jacorie Ernsberger, CMA  

## 2023-05-09 ENCOUNTER — Encounter (INDEPENDENT_AMBULATORY_CARE_PROVIDER_SITE_OTHER): Payer: Self-pay | Admitting: Family Medicine

## 2023-05-09 ENCOUNTER — Ambulatory Visit (INDEPENDENT_AMBULATORY_CARE_PROVIDER_SITE_OTHER): Payer: MEDICAID | Admitting: Family Medicine

## 2023-05-09 VITALS — BP 122/81 | HR 84 | Temp 99.3°F | Ht 73.0 in | Wt 330.0 lb

## 2023-05-09 DIAGNOSIS — Z7984 Long term (current) use of oral hypoglycemic drugs: Secondary | ICD-10-CM

## 2023-05-09 DIAGNOSIS — Z6841 Body Mass Index (BMI) 40.0 and over, adult: Secondary | ICD-10-CM

## 2023-05-09 DIAGNOSIS — D508 Other iron deficiency anemias: Secondary | ICD-10-CM | POA: Diagnosis not present

## 2023-05-09 DIAGNOSIS — Z7985 Long-term (current) use of injectable non-insulin antidiabetic drugs: Secondary | ICD-10-CM

## 2023-05-09 DIAGNOSIS — E538 Deficiency of other specified B group vitamins: Secondary | ICD-10-CM

## 2023-05-09 DIAGNOSIS — E1169 Type 2 diabetes mellitus with other specified complication: Secondary | ICD-10-CM | POA: Diagnosis not present

## 2023-05-09 DIAGNOSIS — K5909 Other constipation: Secondary | ICD-10-CM | POA: Diagnosis not present

## 2023-05-09 DIAGNOSIS — E559 Vitamin D deficiency, unspecified: Secondary | ICD-10-CM | POA: Diagnosis not present

## 2023-05-09 MED ORDER — PNV PRENATAL PLUS MULTIVIT+DHA 27-1 & 312 MG PO MISC: ORAL | 1 refills | Status: DC

## 2023-05-09 MED ORDER — METFORMIN HCL 500 MG PO TABS
500.0000 mg | ORAL_TABLET | Freq: Two times a day (BID) | ORAL | 1 refills | Status: DC
Start: 2023-05-09 — End: 2023-06-20

## 2023-05-09 MED ORDER — NEPHRO-VITE RX 1 MG PO TABS: 1.0000 | ORAL_TABLET | Freq: Every day | ORAL | 1 refills | Status: DC

## 2023-05-09 MED ORDER — LINACLOTIDE 72 MCG PO CAPS: 72.0000 ug | ORAL_CAPSULE | Freq: Every day | ORAL | 1 refills | Status: DC

## 2023-05-09 MED ORDER — VITAMIN D (ERGOCALCIFEROL) 1.25 MG (50000 UNIT) PO CAPS: ORAL_CAPSULE | ORAL | 1 refills | Status: DC

## 2023-05-09 MED ORDER — SEMAGLUTIDE (1 MG/DOSE) 4 MG/3ML ~~LOC~~ SOPN
1.0000 mg | PEN_INJECTOR | SUBCUTANEOUS | 0 refills | Status: DC
Start: 2023-05-09 — End: 2023-06-20

## 2023-05-09 NOTE — Progress Notes (Signed)
Karen Brennan, D.O.  ABFM, ABOM Specializing in Clinical Bariatric Medicine  Office located at: [address] [place], [place]  [address]   Assessment and Plan:   FOR THE DISEASE OF OBESITY: BMI 40.0-44.9, adult (HCC)- current BMI 44.08 Morbid obesity (HCC)-starting bmi 58.45/date 02/01/21 -     Semaglutide (1 MG/DOSE); Inject 1 mg as directed once a week.  Dispense: 3 mL; Refill: 0 Since last office visit on 04/11/2023 patient's  Muscle mass has decreased by 11lb. Fat mass has increased by 8.2lb. Total body water has decreased by 6.2lb.  Counseling done on how various foods will affect these numbers and how to maximize success  Total lbs lost to date: 113 Total weight loss percentage to date: 25.51%    Recommended Dietary Goals Karen Brennan is currently in the action stage of change. As such, her goal is to continue weight management plan.  She has agreed to: continue current plan   Behavioral Intervention We discussed the following today: increasing lean protein intake to established goals, increasing fiber rich foods, continue to practice mindfulness when eating, planning for success, better snacking choices, and continue to work on maintaining a reduced calorie state, getting the recommended amount of protein, incorporating whole foods, making healthy choices, staying well hydrated and practicing mindfulness when eating.   Additional resources provided today: None  Evidence-based interventions for health behavior change were utilized today including the discussion of self monitoring techniques, problem-solving barriers and SMART goal setting techniques.   Regarding patient's less desirable eating habits and patterns, we employed the technique of small changes.   Pt will specifically work on: Walk 5 days a wk for next visit.    Recommended Physical Activity Goals Karen Brennan has been advised to work up to 150 minutes of moderate intensity aerobic activity a week and  strengthening exercises 2-3 times per week for cardiovascular health, weight loss maintenance and preservation of muscle mass.   She has agreed to :  Continue current level of physical activity  and continue to gradually increase the amount and intensity of exercise    Pharmacotherapy We discussed various medication options to help Karen Brennan with her weight loss efforts and we both agreed to : continue with nutritional and behavioral strategies and continue current anti-obesity medication regimen   FOR ASSOCIATED CONDITIONS ADDRESSED TODAY: Vitamin D deficiency Assessment:  Condition is Not at goal.. Labs were reviewed. Pt vitamin D level continues to be sub-optimal and has dropped from 45.6 to 34.9. Patient reports good compliance and tolerance of taking Ergocalciferol consistently.  Lab Results  Component Value Date   VD25OH 34.9 04/11/2023   VD25OH 45.6 10/02/2022   VD25OH 46.1 01/12/2022   Lab Results  Component Value Date   VITAMINB12 325 04/11/2023   Plan: Encouraged pt to continue Ergocalciferol 50K lU twice weekly due to sub-optimal vitamin D level. I discussed the importance of vitamin D to the patient's health and well-being as well as to their ability to lose weight. I reviewed possible symptoms of low Vitamin D:  low energy, depressed mood, muscle aches, joint aches, osteoporosis etc. with patient and ideal vitamin D levels reviewed with patient. Weight loss will likely improve availability of vitamin D, thus encouraged Karen Brennan to continue with meal plan and their weight loss efforts to further improve this condition. Vitamin D deficiency -     Vitamin D (Ergocalciferol); 1 tab q wed and 1 tab q sun  Dispense: 8 capsule; Refill: 1   Type 2 diabetes  mellitus with obesity (HCC) Assessment: Condition is Not at goal.. Labs were reviewed. Pt A1c has been stable at 5.5 as of 04/11/2023. She continues to take Metformin and Semaglutide without difficulty. Pt informed me that she recently  restarted Ozempic a little over a week ago due to having to hold it due to the coloscopy. She tolerates this well and denies any GI upset or N/V/D. Pt CMP, TSH, and magnesium are all within normal levels.  Lab Results  Component Value Date   HGBA1C 5.5 04/11/2023   HGBA1C 5.5 10/02/2022   HGBA1C 5.7 (H) 06/01/2022   INSULIN 11.0 01/12/2022   INSULIN 22.7 02/01/2021   Lab Results  Component Value Date   CREATININE 0.68 04/11/2023   BUN 8 04/11/2023   NA 142 04/11/2023   K 3.9 04/11/2023   CL 103 04/11/2023   CO2 24 04/11/2023      Component Value Date/Time   PROT 6.3 04/11/2023 1050   ALBUMIN 4.1 04/11/2023 1050   AST 20 04/11/2023 1050   ALT 16 04/11/2023 1050   ALKPHOS 56 04/11/2023 1050   BILITOT 1.1 04/11/2023 1050   BILIDIR 0.1 02/02/2009 2254   IBILI 0.6 02/02/2009 2254   Lab Results  Component Value Date   TSH 1.150 04/11/2023   Component Ref Range & Units 4 wk ago 7 mo ago 1 yr ago 3 yr ago  Magnesium 1.6 - 2.3 mg/dL 1.9 2.0 1.7 1.8   Plan:  Since her A1c level has been stable and controlled I advised pt to decrease her Metformin from 1000mg  to 500mg . We extensively discussed the importance of decreasing simple carbs, increasing proteins and how certain foods they eat will affect their blood sugars.  Type 2 diabetes mellitus with obesity (HCC)  -     Semaglutide (1 MG/DOSE); Inject 1 mg as directed once a week.  Dispense: 3 mL; Refill: 0 -     metFORMIN HCl; Take 1 tablet (500 mg total) by mouth 2 (two) times daily with a meal.  Dispense: 60 tablet; Refill: 1   Other constipation Assessment & Plan: Condition is Controlled.. Patient reports good compliance and tolerance of taking Linzess without difficulty. She notes having regular bowel movements. She continues to eat fiber rich foods and stays hydrated. She request need for refill.  Other constipation -     linaCLOtide; Take 1 capsule (72 mcg total) by mouth daily before breakfast.  Dispense: 30 capsule;  Refill: 1   Other iron deficiency anemia Assessment & Plan: Pt continues to take her Prenatal vitamin without difficulty for her hx of iron deficiency anemia.  She denies feeling symptoms of fatigue.  Lab Results  Component Value Date   WBC 6.6 09/06/2022   HGB 13.9 09/06/2022   HCT 42.8 09/06/2022   MCV 90 09/06/2022   PLT 388 09/06/2022  Other iron deficiency anemia -     PNV Prenatal Plus Multivit+DHA; 1 po qd  Dispense: 30 each; Refill: 1   B12 Deficiency Assessment: Condition is Not at goal.. Pt B-12 level is below the optimal level of 500. She does not take any B complex vitamins or any multi-vitamins that are not her prenatals at this time. I advised pt to begin a B-12 supplement and increase her intake of B-12 rich foods such as lean red meats and greek yogurts.  Lab Results  Component Value Date   VITAMINB12 325 04/11/2023   Other orders -     Nephro-Vite Rx; Take 1 tablet by mouth daily  with breakfast.  Dispense: 30 tablet; Refill: 1  Follow up:   Return in about 6 weeks (around 06/20/2023). She was informed of the importance of frequent follow up visits to maximize her success with intensive lifestyle modifications for her multiple health conditions.  Subjective:   Chief complaint: Obesity Karen Brennan is here to discuss her progress with her obesity treatment plan. She is on the the Category 3 Plan with breakfast and lunch and states she is following her eating plan approximately 80% of the time. She states she is walking 30 minutes 3 days per week.  Interval History:  Karen Brennan is here for a follow up office visit. Since last OV,  she is doing better. She recently had a colposcopy and at her biopsy site this became infected. She was placed on antibiotics and eating healthy was not on her mind during this time. She still tried her best to eat on plan when she was feeling better.   Barriers identified: medical issues.   Pharmacotherapy for weight loss: She is currently  taking Metformin (off label use for incretin effect and / or insulin resistance and / or diabetes prevention) with adequate clinical response  and without side effects. and Ozempic with diabetes as the primary indication with adequate clinical response  and without side effects..   Review of Systems:  Pertinent positives were addressed with patient today.  Reviewed by clinician on day of visit: allergies, medications, problem list, medical history, surgical history, family history, social history, and previous encounter notes.  Weight Summary and Biometrics   Weight Lost Since Last Visit: 4lb  Weight Gained Since Last Visit: 0lb    Vitals Temp: 99.3 F (37.4 C) BP: 122/81 Pulse Rate: 84 SpO2: 99 %   Anthropometric Measurements Height: 6\' 1"  (1.854 m) Weight: (!) 330 lb (149.7 kg) BMI (Calculated): 43.55 Weight at Last Visit: 334lb Weight Lost Since Last Visit: 4lb Weight Gained Since Last Visit: 0lb Starting Weight: 443lb Total Weight Loss (lbs): 113 lb (51.3 kg) Peak Weight: 505lb   Body Composition  Body Fat %: 54.2 % Fat Mass (lbs): 179.2 lbs Muscle Mass (lbs): 143.8 lbs Total Body Water (lbs): 109.8 lbs Visceral Fat Rating : 17   Other Clinical Data Fasting: no Labs: no Today's Visit #: 34 Starting Date: 02/01/21    Objective:   PHYSICAL EXAM: Blood pressure 122/81, pulse 84, temperature 99.3 F (37.4 C), height 6\' 1"  (1.854 m), weight (!) 330 lb (149.7 kg), SpO2 99%. Body mass index is 43.54 kg/m.  General: she is overweight, cooperative and in no acute distress. PSYCH: Has normal mood, affect and thought process.   HEENT: EOMI, sclerae are anicteric. Lungs: Normal breathing effort, no conversational dyspnea. Extremities: Moves * 4 Neurologic: A and O * 3, good insight  DIAGNOSTIC DATA REVIEWED: BMET    Component Value Date/Time   NA 142 04/11/2023 1050   K 3.9 04/11/2023 1050   CL 103 04/11/2023 1050   CO2 24 04/11/2023 1050   GLUCOSE 81  04/11/2023 1050   GLUCOSE 208 (H) 07/04/2022 1037   BUN 8 04/11/2023 1050   CREATININE 0.68 04/11/2023 1050   CREATININE 0.82 07/05/2016 0942   CALCIUM 9.0 04/11/2023 1050   GFRNONAA >60 07/04/2022 1037   GFRNONAA >89 07/05/2016 0942   GFRAA 99 02/12/2020 1423   GFRAA >89 07/05/2016 0942   Lab Results  Component Value Date   HGBA1C 5.5 04/11/2023   HGBA1C 5.7 09/15/2011   Lab Results  Component Value Date  INSULIN 11.0 01/12/2022   INSULIN 22.7 02/01/2021   Lab Results  Component Value Date   TSH 1.150 04/11/2023   CBC    Component Value Date/Time   WBC 6.6 09/06/2022 1057   WBC 10.4 07/04/2022 1037   RBC 4.78 09/06/2022 1057   RBC 4.76 07/04/2022 1037   HGB 13.9 09/06/2022 1057   HCT 42.8 09/06/2022 1057   PLT 388 09/06/2022 1057   MCV 90 09/06/2022 1057   MCH 29.1 09/06/2022 1057   MCH 29.6 07/04/2022 1037   MCHC 32.5 09/06/2022 1057   MCHC 32.5 07/04/2022 1037   RDW 12.2 09/06/2022 1057   Iron Studies    Component Value Date/Time   IRON 30 06/20/2021 1205   TIBC 444 06/20/2021 1205   FERRITIN 8 (L) 06/20/2021 1205   IRONPCTSAT 7 (LL) 06/20/2021 1205   Lipid Panel     Component Value Date/Time   CHOL 126 02/16/2023 1605   TRIG 62 02/16/2023 1605   HDL 57 02/16/2023 1605   CHOLHDL 2.2 02/16/2023 1605   CHOLHDL 3.8 12/09/2014 1102   VLDL 25 12/09/2014 1102   LDLCALC 56 02/16/2023 1605   Hepatic Function Panel     Component Value Date/Time   PROT 6.3 04/11/2023 1050   ALBUMIN 4.1 04/11/2023 1050   AST 20 04/11/2023 1050   ALT 16 04/11/2023 1050   ALKPHOS 56 04/11/2023 1050   BILITOT 1.1 04/11/2023 1050   BILIDIR 0.1 02/02/2009 2254   IBILI 0.6 02/02/2009 2254      Component Value Date/Time   TSH 1.150 04/11/2023 1050   Nutritional Lab Results  Component Value Date   VD25OH 34.9 04/11/2023   VD25OH 45.6 10/02/2022   VD25OH 46.1 01/12/2022    Attestations:   I, Clinical biochemist, acting as a Stage manager for [employer], DO.,  have compiled all relevant documentation for today's office visit on behalf of Thomasene Lot, DO, while in the presence of [employer], DO.  Reviewed by clinician on day of visit: allergies, medications, problem list, medical history, surgical history, family history, social history, and previous encounter notes pertinent to patient's obesity diagnosis. preparing to see patient (e.g. review and interpretation of tests, old notes ), obtaining and/or reviewing separately obtained history, performing a medically appropriate examination or evaluation, counseling and educating the patient, ordering medications, test or procedures, documenting clinical information in the electronic or other health care record, and independently interpreting results and communicating results to the patient, family, or caregiver   I have reviewed the above documentation for accuracy and completeness, and I agree with the above. Karen Brennan, D.O.  The 21st Century Cures Act was signed into law in 2016 which includes the topic of electronic health records.  This provides immediate access to information in MyChart.  This includes consultation notes, operative notes, office notes, lab results and pathology reports.  If you have any questions about what you read please let us know at your next visit so we can discuss your concerns and take corrective action if need be.  We are right here with you.

## 2023-05-14 DIAGNOSIS — R69 Illness, unspecified: Secondary | ICD-10-CM | POA: Diagnosis not present

## 2023-05-15 DIAGNOSIS — R69 Illness, unspecified: Secondary | ICD-10-CM | POA: Diagnosis not present

## 2023-05-16 DIAGNOSIS — R69 Illness, unspecified: Secondary | ICD-10-CM | POA: Diagnosis not present

## 2023-05-17 DIAGNOSIS — R69 Illness, unspecified: Secondary | ICD-10-CM | POA: Diagnosis not present

## 2023-05-18 DIAGNOSIS — R69 Illness, unspecified: Secondary | ICD-10-CM | POA: Diagnosis not present

## 2023-05-21 DIAGNOSIS — R69 Illness, unspecified: Secondary | ICD-10-CM | POA: Diagnosis not present

## 2023-05-22 DIAGNOSIS — R69 Illness, unspecified: Secondary | ICD-10-CM | POA: Diagnosis not present

## 2023-05-23 DIAGNOSIS — R69 Illness, unspecified: Secondary | ICD-10-CM | POA: Diagnosis not present

## 2023-05-24 DIAGNOSIS — R69 Illness, unspecified: Secondary | ICD-10-CM | POA: Diagnosis not present

## 2023-05-25 DIAGNOSIS — R69 Illness, unspecified: Secondary | ICD-10-CM | POA: Diagnosis not present

## 2023-05-28 DIAGNOSIS — R69 Illness, unspecified: Secondary | ICD-10-CM | POA: Diagnosis not present

## 2023-05-29 DIAGNOSIS — R69 Illness, unspecified: Secondary | ICD-10-CM | POA: Diagnosis not present

## 2023-05-30 DIAGNOSIS — R69 Illness, unspecified: Secondary | ICD-10-CM | POA: Diagnosis not present

## 2023-05-31 DIAGNOSIS — R69 Illness, unspecified: Secondary | ICD-10-CM | POA: Diagnosis not present

## 2023-06-01 DIAGNOSIS — R87613 High grade squamous intraepithelial lesion on cytologic smear of cervix (HGSIL): Secondary | ICD-10-CM | POA: Diagnosis not present

## 2023-06-01 DIAGNOSIS — R69 Illness, unspecified: Secondary | ICD-10-CM | POA: Diagnosis not present

## 2023-06-04 DIAGNOSIS — R69 Illness, unspecified: Secondary | ICD-10-CM | POA: Diagnosis not present

## 2023-06-05 DIAGNOSIS — R69 Illness, unspecified: Secondary | ICD-10-CM | POA: Diagnosis not present

## 2023-06-06 DIAGNOSIS — R69 Illness, unspecified: Secondary | ICD-10-CM | POA: Diagnosis not present

## 2023-06-07 DIAGNOSIS — R69 Illness, unspecified: Secondary | ICD-10-CM | POA: Diagnosis not present

## 2023-06-08 DIAGNOSIS — R69 Illness, unspecified: Secondary | ICD-10-CM | POA: Diagnosis not present

## 2023-06-11 DIAGNOSIS — R69 Illness, unspecified: Secondary | ICD-10-CM | POA: Diagnosis not present

## 2023-06-12 DIAGNOSIS — R69 Illness, unspecified: Secondary | ICD-10-CM | POA: Diagnosis not present

## 2023-06-13 DIAGNOSIS — R69 Illness, unspecified: Secondary | ICD-10-CM | POA: Diagnosis not present

## 2023-06-14 ENCOUNTER — Other Ambulatory Visit (INDEPENDENT_AMBULATORY_CARE_PROVIDER_SITE_OTHER): Payer: Self-pay | Admitting: Family Medicine

## 2023-06-14 DIAGNOSIS — E559 Vitamin D deficiency, unspecified: Secondary | ICD-10-CM

## 2023-06-14 DIAGNOSIS — R69 Illness, unspecified: Secondary | ICD-10-CM | POA: Diagnosis not present

## 2023-06-14 DIAGNOSIS — E1169 Type 2 diabetes mellitus with other specified complication: Secondary | ICD-10-CM

## 2023-06-15 ENCOUNTER — Other Ambulatory Visit: Payer: Self-pay

## 2023-06-15 DIAGNOSIS — G5793 Unspecified mononeuropathy of bilateral lower limbs: Secondary | ICD-10-CM

## 2023-06-15 DIAGNOSIS — R69 Illness, unspecified: Secondary | ICD-10-CM | POA: Diagnosis not present

## 2023-06-18 DIAGNOSIS — R69 Illness, unspecified: Secondary | ICD-10-CM | POA: Diagnosis not present

## 2023-06-19 ENCOUNTER — Telehealth (INDEPENDENT_AMBULATORY_CARE_PROVIDER_SITE_OTHER): Payer: Self-pay

## 2023-06-19 ENCOUNTER — Encounter (INDEPENDENT_AMBULATORY_CARE_PROVIDER_SITE_OTHER): Payer: Self-pay

## 2023-06-19 DIAGNOSIS — R69 Illness, unspecified: Secondary | ICD-10-CM | POA: Diagnosis not present

## 2023-06-19 MED ORDER — GABAPENTIN 300 MG PO CAPS: 300.0000 mg | ORAL_CAPSULE | Freq: Three times a day (TID) | ORAL | 1 refills | Status: DC

## 2023-06-19 NOTE — Telephone Encounter (Signed)
 Prior auth sent Key: Lavella Hammock)

## 2023-06-19 NOTE — Telephone Encounter (Signed)
 Request Reference Number: [id number]. OZEMPIC INJ 4MG /3ML is approved through 06/18/2024. For further questions, call [employer] at [phone].Marland Kitchen Authorization Expiration Date: June 18, 2024.

## 2023-06-20 ENCOUNTER — Encounter (INDEPENDENT_AMBULATORY_CARE_PROVIDER_SITE_OTHER): Payer: Self-pay | Admitting: Family Medicine

## 2023-06-20 ENCOUNTER — Ambulatory Visit (INDEPENDENT_AMBULATORY_CARE_PROVIDER_SITE_OTHER): Payer: Medicaid Other | Admitting: Family Medicine

## 2023-06-20 VITALS — BP 129/84 | HR 82 | Temp 97.7°F | Ht 73.0 in | Wt 321.0 lb

## 2023-06-20 DIAGNOSIS — Z7984 Long term (current) use of oral hypoglycemic drugs: Secondary | ICD-10-CM

## 2023-06-20 DIAGNOSIS — R69 Illness, unspecified: Secondary | ICD-10-CM | POA: Diagnosis not present

## 2023-06-20 DIAGNOSIS — E1169 Type 2 diabetes mellitus with other specified complication: Secondary | ICD-10-CM

## 2023-06-20 DIAGNOSIS — D508 Other iron deficiency anemias: Secondary | ICD-10-CM | POA: Diagnosis not present

## 2023-06-20 DIAGNOSIS — Z6841 Body Mass Index (BMI) 40.0 and over, adult: Secondary | ICD-10-CM

## 2023-06-20 DIAGNOSIS — Z7985 Long-term (current) use of injectable non-insulin antidiabetic drugs: Secondary | ICD-10-CM

## 2023-06-20 DIAGNOSIS — K5909 Other constipation: Secondary | ICD-10-CM

## 2023-06-20 DIAGNOSIS — E559 Vitamin D deficiency, unspecified: Secondary | ICD-10-CM | POA: Diagnosis not present

## 2023-06-20 DIAGNOSIS — E538 Deficiency of other specified B group vitamins: Secondary | ICD-10-CM

## 2023-06-20 MED ORDER — PNV PRENATAL PLUS MULTIVIT+DHA 27-1 & 312 MG PO MISC: ORAL | 1 refills | Status: DC

## 2023-06-20 MED ORDER — LINACLOTIDE 72 MCG PO CAPS: 72.0000 ug | ORAL_CAPSULE | Freq: Every day | ORAL | 1 refills | Status: DC

## 2023-06-20 MED ORDER — VITAMIN D (ERGOCALCIFEROL) 1.25 MG (50000 UNIT) PO CAPS: ORAL_CAPSULE | ORAL | 1 refills | Status: DC

## 2023-06-20 MED ORDER — NEPHRO-VITE RX 1 MG PO TABS: 1.0000 | ORAL_TABLET | Freq: Every day | ORAL | 1 refills | Status: DC

## 2023-06-20 MED ORDER — SEMAGLUTIDE (1 MG/DOSE) 4 MG/3ML ~~LOC~~ SOPN: 1.0000 mg | PEN_INJECTOR | SUBCUTANEOUS | 0 refills | Status: DC

## 2023-06-20 MED ORDER — METFORMIN HCL 500 MG PO TABS: 500.0000 mg | ORAL_TABLET | Freq: Two times a day (BID) | ORAL | 1 refills | Status: DC

## 2023-06-20 NOTE — Progress Notes (Signed)
 Karen Brennan, D.O.  ABFM, ABOM Specializing in Clinical Bariatric Medicine  Office located at: [address] [place], [place]  [address]   Assessment and Plan:   FOR THE DISEASE OF OBESITY: Morbid obesity (HCC)-starting bmi 58.45/date 02/01/21 BMI 40.0-44.9, adult (HCC)- current BMI 42.36 Assessment & Plan: Bariatric information not obtained today.   Total lbs lost to date: 122 lbs  Total weight loss percentage to date: 27.54 %    Recommended Dietary Goals Karen Brennan is currently in the action stage of change. As such, her goal is to continue weight management plan.  She has agreed to: continue current plan   Behavioral Intervention We discussed the following today: increasing lean protein intake to established goals, avoiding temptations and identifying enticing environmental cues, and continue to work on maintaining a reduced calorie state, getting the recommended amount of protein, incorporating whole foods, making healthy choices, staying well hydrated and practicing mindfulness when eating.  Additional resources provided today: Benefits of losing 7-10% body weight handout.   Evidence-based interventions for health behavior change were utilized today including the discussion of self monitoring techniques, problem-solving barriers and SMART goal setting techniques.   Regarding patient's less desirable eating habits and patterns, we employed the technique of small changes.   Pt will specifically work on: Increase exercise to at least 30 minutes 4 days a week for next visit.    Recommended Physical Activity Goals Karen Brennan has been advised to work up to 150 minutes of moderate intensity aerobic activity a week and strengthening exercises 2-3 times per week for cardiovascular health, weight loss maintenance and preservation of muscle mass.   She has agreed to :  Increase physical activity in their day and reduce sedentary time (increase NEAT).   Pharmacotherapy We  discussed various medication options to help [facility] with her weight loss efforts and we both agreed to : continue with nutritional and behavioral strategies and continue current anti-obesity medication regimen   FOR ASSOCIATED CONDITIONS ADDRESSED TODAY: Other constipation Assessment & Plan: Condition is well controlled. Pt taking Linzess  72 mcg once daily. Tolerating well, no side effects. No acute concerns today. Continue with current regimen with no changes. Will follow up as relevant to weight loss.   Orders:  - Refill Linzess  today, no dose changes.    Type 2 diabetes mellitus with obesity [facility]) Assessment & Plan: Lab Results  Component Value Date   HGBA1C 5.5 04/11/2023   HGBA1C 5.5 10/02/2022   HGBA1C 5.7 (H) 06/01/2022   INSULIN  11.0 01/12/2022   INSULIN  22.7 02/01/2021   Last A1C is at goal at 5.5. Pt compliant with Semaglutide  once weekly and Metformin  500 mg BID. Pt would not like to increase the doses of Metformin . Endorses feeling unwell when eating off-plan foods such as pork and pastas. States she has been eating smaller meals due to effectiveness of Semaglutide . Aryelle reports a recent flare up of an old ankle injury that has made it difficult for her to exercise with the cold weather.   Reviewed ways to increase daily protein intake with a protein shake or high protein snacks. I also recommend she increase her protein intake at least 30 g with every meal. Increase exercise as able and seek indoor alternatives.  Continue with current medication as instructed to manage her diabetes.  Orders: - Refill Metformin , no dose changes.  - Refill Semaglutide , no dose changes.    Other iron deficiency anemia Assessment & Plan: Lab Results  Component Value Date  IRON 30 06/20/2021   TIBC 444 06/20/2021   FERRITIN 8 (L) 06/20/2021   Pt is taking her prenatals for iron supplementation. Tolerating well, no adverse effects. No acute concerns today. Continue with current regimen  as directed.   Orders:  - Refill prenatal vitamin, no changes.    Vitamin D  deficiency Assessment & Plan: Lab Results  Component Value Date   VD25OH 34.9 04/11/2023   VD25OH 45.6 10/02/2022   VD25OH 46.1 01/12/2022   Last vitamin D  below goal at 34.9. Pt compliant with ERGO 50K units twice weekly (Wednesday/Sunday). Tolerating well, no side effects reported. She reports also taking her B12 as directed. Continue with current vitamin D  and B12 supplementation. No dose changes to either medication.   Orders: -Refill ERGO, no changes to dosage.  -Refill B12 OTC, no dose change.   Follow up:   Return in about 4 weeks (around 07/18/2023). She was informed of the importance of frequent follow up visits to maximize her success with intensive lifestyle modifications for her multiple health conditions.  Subjective:   Chief complaint: Obesity Karen Brennan is here to discuss her progress with her obesity treatment plan. She is on the the Category 3 Plan with B/L options and states she is following her eating plan approximately 50% of the time. She states she is walking 15 minutes minutes 4 days per week.  Interval History:  Karen Brennan is here for a follow up office visit. Since last OV, she is down 9. Has increased fruit and vegetable intake but struggled to meet her daily protein goals. Reports not feeling well when eating off-plan. Feels she should be eating smaller meals. Drinks protein shake at least once daily.   Barriers identified: Holiday celebration temptations, struggled with meeting daily protein goal.   Pharmacotherapy for weight loss: She is currently taking Metformin  (off label use for incretin effect and / or insulin  resistance and / or diabetes prevention) with adequate clinical response  and without side effects. and Ozempic  with diabetes as the primary indication with adequate clinical response  and without side effects..   Review of Systems:  Pertinent positives were addressed  with patient today.  Reviewed by clinician on day of visit: allergies, medications, problem list, medical history, surgical history, family history, social history, and previous encounter notes.  Weight Summary and Biometrics   Weight Lost Since Last Visit: 9lb  Weight Gained Since Last Visit: 0lb    Vitals Temp: 97.7 F (36.5 C) BP: 129/84 Pulse Rate: 82 SpO2: 100 %   Anthropometric Measurements Height: 6' 1 (1.854 m) Weight: (!) 321 lb (145.6 kg) BMI (Calculated): 42.36 Weight at Last Visit: 330lb Weight Lost Since Last Visit: 9lb Weight Gained Since Last Visit: 0lb Starting Weight: 443lb Total Weight Loss (lbs): 122 lb (55.3 kg) Peak Weight: 505lb   No data recorded Other Clinical Data Fasting: no Labs: no Today's Visit #: 35 Starting Date: 02/01/21    Objective:   PHYSICAL EXAM: Blood pressure 129/84, pulse 82, temperature 97.7 F (36.5 C), height 6' 1 (1.854 m), weight (!) 321 lb (145.6 kg), SpO2 100%. Body mass index is 42.35 kg/m.  General: she is overweight, cooperative and in no acute distress. PSYCH: Has normal mood, affect and thought process.   HEENT: EOMI, sclerae are anicteric. Lungs: Normal breathing effort, no conversational dyspnea. Extremities: Moves * 4 Neurologic: A and O * 3, good insight  DIAGNOSTIC DATA REVIEWED: BMET    Component Value Date/Time   NA 142 04/11/2023 1050  K 3.9 04/11/2023 1050   CL 103 04/11/2023 1050   CO2 24 04/11/2023 1050   GLUCOSE 81 04/11/2023 1050   GLUCOSE 208 (H) 07/04/2022 1037   BUN 8 04/11/2023 1050   CREATININE 0.68 04/11/2023 1050   CREATININE 0.82 07/05/2016 0942   CALCIUM  9.0 04/11/2023 1050   GFRNONAA >60 07/04/2022 1037   GFRNONAA >89 07/05/2016 0942   GFRAA 99 02/12/2020 1423   GFRAA >89 07/05/2016 0942   Lab Results  Component Value Date   HGBA1C 5.5 04/11/2023   HGBA1C 5.7 09/15/2011   Lab Results  Component Value Date   INSULIN  11.0 01/12/2022   INSULIN  22.7 02/01/2021    Lab Results  Component Value Date   TSH 1.150 04/11/2023   CBC    Component Value Date/Time   WBC 6.6 09/06/2022 1057   WBC 10.4 07/04/2022 1037   RBC 4.78 09/06/2022 1057   RBC 4.76 07/04/2022 1037   HGB 13.9 09/06/2022 1057   HCT 42.8 09/06/2022 1057   PLT 388 09/06/2022 1057   MCV 90 09/06/2022 1057   MCH 29.1 09/06/2022 1057   MCH 29.6 07/04/2022 1037   MCHC 32.5 09/06/2022 1057   MCHC 32.5 07/04/2022 1037   RDW 12.2 09/06/2022 1057   Iron Studies    Component Value Date/Time   IRON 30 06/20/2021 1205   TIBC 444 06/20/2021 1205   FERRITIN 8 (L) 06/20/2021 1205   IRONPCTSAT 7 (LL) 06/20/2021 1205   Lipid Panel     Component Value Date/Time   CHOL 126 02/16/2023 1605   TRIG 62 02/16/2023 1605   HDL 57 02/16/2023 1605   CHOLHDL 2.2 02/16/2023 1605   CHOLHDL 3.8 12/09/2014 1102   VLDL 25 12/09/2014 1102   LDLCALC 56 02/16/2023 1605   Hepatic Function Panel     Component Value Date/Time   PROT 6.3 04/11/2023 1050   ALBUMIN 4.1 04/11/2023 1050   AST 20 04/11/2023 1050   ALT 16 04/11/2023 1050   ALKPHOS 56 04/11/2023 1050   BILITOT 1.1 04/11/2023 1050   BILIDIR 0.1 02/02/2009 2254   IBILI 0.6 02/02/2009 2254      Component Value Date/Time   TSH 1.150 04/11/2023 1050   Nutritional Lab Results  Component Value Date   VD25OH 34.9 04/11/2023   VD25OH 45.6 10/02/2022   VD25OH 46.1 01/12/2022    Attestations:   I, Karen Brennan, acting as a stage manager for Karen Jenkins, DO., have compiled all relevant documentation for today's office visit on behalf of Karen Jenkins, DO, while in the presence of [employer], DO.  Reviewed by clinician on day of visit: allergies, medications, problem list, medical history, surgical history, family history, social history, and previous encounter notes pertinent to patient's obesity diagnosis.  I have reviewed the above documentation for accuracy and completeness, and I agree with the above. Karen Brennan,  D.O.  The 21st Century Cures Act was signed into law in 2016 which includes the topic of electronic health records.  This provides immediate access to information in MyChart.  This includes consultation notes, operative notes, office notes, lab results and pathology reports.  If you have any questions about what you read please let us  know at your next visit so we can discuss your concerns and take corrective action if need be.  We are right here with you.

## 2023-06-21 DIAGNOSIS — R69 Illness, unspecified: Secondary | ICD-10-CM | POA: Diagnosis not present

## 2023-06-22 DIAGNOSIS — R69 Illness, unspecified: Secondary | ICD-10-CM | POA: Diagnosis not present

## 2023-06-25 ENCOUNTER — Encounter: Payer: Self-pay | Admitting: Student

## 2023-06-25 ENCOUNTER — Ambulatory Visit (INDEPENDENT_AMBULATORY_CARE_PROVIDER_SITE_OTHER): Payer: Self-pay | Admitting: Student

## 2023-06-25 VITALS — BP 118/87 | HR 78 | Ht 73.0 in | Wt 327.4 lb

## 2023-06-25 DIAGNOSIS — S91301A Unspecified open wound, right foot, initial encounter: Secondary | ICD-10-CM | POA: Diagnosis present

## 2023-06-25 DIAGNOSIS — M722 Plantar fascial fibromatosis: Secondary | ICD-10-CM | POA: Diagnosis not present

## 2023-06-25 DIAGNOSIS — R69 Illness, unspecified: Secondary | ICD-10-CM | POA: Diagnosis not present

## 2023-06-25 NOTE — Assessment & Plan Note (Addendum)
 Patient comes in with small/pinpoint foot wound on side of heel, that happened a few days before.  Patient reports that a candle broke and she got glass in her foot.  Patient reports she was able to take all the glass out of her foot, but wanted provider to check to be sure.  No glass under inspection/deep palpation, no tenderness, no erythema redness, no swelling.  Foot wound appears to be healing, will recommend patient provide normal wound care, soap and water, keep dry, use bandage.  Encourage /- Neosporin as needed.  Patient is diabetic, with neuropathy, will recommend daily checks of foot.  Return precautions for swelling/pus drainage/redness/increased pain.  Patient declines foot x-ray today. - Wound care, keep covered and dry and clean - Follow-up in 2 weeks if wound not healed - Return precautions for signs of infection - Check foot daily for healing/worsening

## 2023-06-25 NOTE — Progress Notes (Signed)
  SUBJECTIVE:   CHIEF COMPLAINT / HPI:   Foot pain Candle broke and she ended up getting glass in her feet. She took the glass out, but is worried that there may still be more, as her foot hurts. She request I inspect foot for more glass, declines foot x-ray today.   Plantar Fascitis Bottom of foot hurts, all the way to heel, worse with walking. Was seen and given meloxicam , f/u w/ Sport's Medicine, but did not let them know about this issue (saw them for ankle issue).    PERTINENT  PMH / PSH:    OBJECTIVE:  BP 118/87   Pulse 78   Ht 6' 1 (1.854 m)   Wt (!) 327 lb 6.4 oz (148.5 kg)   SpO2 98%   BMI 43.20 kg/m  Physical Exam Musculoskeletal:       Feet:  Feet:     Right foot:     Skin integrity: Skin breakdown and dry skin present. No ulcer, blister, erythema, warmth, callus or fissure.     Toenail Condition: Right toenails are normal.     Comments: Small wound, nearly pinpoint , on side of heel     ASSESSMENT/PLAN:   Assessment & Plan Open wound of foot, right, initial encounter Patient comes in with small/pinpoint foot wound on side of heel, that happened a few days before.  Patient reports that a candle broke and she got glass in her foot.  Patient reports she was able to take all the glass out of her foot, but wanted provider to check to be sure.  No glass under inspection/deep palpation, no tenderness, no erythema redness, no swelling.  Foot wound appears to be healing, will recommend patient provide normal wound care, soap and water, keep dry, use bandage.  Encourage /- Neosporin as needed.  Patient is diabetic, with neuropathy, will recommend daily checks of foot.  Return precautions for swelling/pus drainage/redness/increased pain.  Patient declines foot x-ray today. - Wound care, keep covered and dry and clean - Follow-up in 2 weeks if wound not healed - Return precautions for signs of infection - Check foot daily for healing/worsening Plantar fasciitis Patient  comes in with continued pain in feet, from plantar fasciitis.  Patient was previously diagnosed with this, during clinic visit 02/26/23, and recommended to follow-up with sports medicine for evaluation/shoe inserts.  Patient followed up with sports medicine, but forgot to mention plantar fasciitis.  Will recommend patient refollow-up with sports medicine. - Follow-up sports medicine No follow-ups on file. Penne Rhein, MD 06/25/2023, 1:57 PM PGY-3, [facility]

## 2023-06-25 NOTE — Patient Instructions (Addendum)
 It was great to see you! Thank you for allowing me to participate in your care!  I recommend that you always bring your medications to each appointment as this makes it easy to ensure we are on the correct medications and helps us  not miss when refills are needed.  Our plans for today:  - Foot Pain Your foot looks okay, and is not showing any signs of infection, I also don't appreciate any glass in it either. Your wound she heal up over the next 2 weeks. If not, follow up with your doctor.   Keep wound clean with soap and water, dry, and bandaged, until completely healed  Can use neosporin if you like   If wound develops any, pus, swelling, redness, or increased tenderness, make appointment right away to be seen.   Take care and seek immediate care sooner if you develop any concerns.   Dr. Penne Rhein, MD [facility]

## 2023-06-25 NOTE — Assessment & Plan Note (Addendum)
 Patient comes in with continued pain in feet, from plantar fasciitis.  Patient was previously diagnosed with this, during clinic visit 02/26/23, and recommended to follow-up with sports medicine for evaluation/shoe inserts.  Patient followed up with sports medicine, but forgot to mention plantar fasciitis.  Will recommend patient refollow-up with sports medicine. - Follow-up sports medicine

## 2023-06-26 ENCOUNTER — Telehealth: Payer: Self-pay | Admitting: Family Medicine

## 2023-06-26 DIAGNOSIS — R69 Illness, unspecified: Secondary | ICD-10-CM | POA: Diagnosis not present

## 2023-06-26 NOTE — Telephone Encounter (Signed)
 Dois Davenport from [facility] called checking on the status of the PCS form. It is in the doctors box it was faxed over to [place] on 06/12/2023 and they are needing it ASAP due to it expiring 07/13/2023.   Please Advise.   Thanks!

## 2023-06-26 NOTE — Telephone Encounter (Signed)
Routed message to PCP. Karen Brennan, CMA  

## 2023-06-27 ENCOUNTER — Telehealth: Payer: Self-pay | Admitting: Family Medicine

## 2023-06-27 DIAGNOSIS — R69 Illness, unspecified: Secondary | ICD-10-CM | POA: Diagnosis not present

## 2023-06-27 NOTE — Telephone Encounter (Signed)
 Called patient to discuss request for personal care services. Verified patient's name and DOB. Patient states her psychiatrist filled out this form in the past but she was unable to get an appointment with them prior to the end of the month when the form is due. Scheduled patient for visit with me tomorrow 1/16 to complete form.

## 2023-06-28 ENCOUNTER — Ambulatory Visit: Payer: Self-pay | Admitting: Family Medicine

## 2023-06-28 ENCOUNTER — Ambulatory Visit: Payer: Medicaid Other

## 2023-06-28 DIAGNOSIS — R69 Illness, unspecified: Secondary | ICD-10-CM | POA: Diagnosis not present

## 2023-06-29 ENCOUNTER — Telehealth: Payer: Self-pay | Admitting: Family Medicine

## 2023-06-29 DIAGNOSIS — R69 Illness, unspecified: Secondary | ICD-10-CM | POA: Diagnosis not present

## 2023-06-29 NOTE — Telephone Encounter (Signed)
Called patient to discuss request for personal care services. Patient states that current caregivers help her with with bathing, dressing, and mobilizing around her house. Her daughter is able to help her sometimes but is not available consistently. It is my medical opinion that personal care services are appropriate for this patient.

## 2023-07-02 DIAGNOSIS — R69 Illness, unspecified: Secondary | ICD-10-CM | POA: Diagnosis not present

## 2023-07-03 ENCOUNTER — Ambulatory Visit: Payer: Self-pay | Admitting: Family Medicine

## 2023-07-03 DIAGNOSIS — R69 Illness, unspecified: Secondary | ICD-10-CM | POA: Diagnosis not present

## 2023-07-04 DIAGNOSIS — R69 Illness, unspecified: Secondary | ICD-10-CM | POA: Diagnosis not present

## 2023-07-05 ENCOUNTER — Other Ambulatory Visit: Payer: Self-pay | Admitting: Family Medicine

## 2023-07-05 DIAGNOSIS — R69 Illness, unspecified: Secondary | ICD-10-CM | POA: Diagnosis not present

## 2023-07-05 DIAGNOSIS — L304 Erythema intertrigo: Secondary | ICD-10-CM

## 2023-07-05 NOTE — Progress Notes (Signed)
Cardiology Office Note:    Date:  07/06/2023   ID:  Karen Brennan, DOB [birth year], MRN [id number]  PCP:  Lorayne Bender, MD  Cardiologist:  Bryan Lemma, MD  Electrophysiologist:  None   Referring MD: Lorayne Bender, MD   Chief Complaint  Patient presents with   Palpitations    History of Present Illness:    Karen Brennan is a 48 y.o. female with a hx of bipolar disorder, hypertension, prediabetes, anemia, obesity status post gastric bypass who presents for follow-up.  She was referred by Dr. Jennette Kettle for an initial evaluation of tachycardia, initially seen on 07/14/2019.  She was seen by Dr. Parke Simmers and Dr. Jennette Kettle on 07/09/2019, reported worsening exercise intolerance and episodes of tachycardia.  Reports that she has episodes where she feels like her heart is racing, will last about 5 minutes and resolve.  Happening about once per month or so.  She reports that she does exercise videos and walks, but becomes fatigued after a few minutes.  States that she gets short of breath with minimal exertion.  She does report that she gets occasional chest pain, which she describes as aching pain in center of her chest.  Episodes occur at rest, denies any chest pain when walking or doing her exercise videos.  Pain lasts a few minutes and resolves.  Smoked 1ppd x 2 years, quit in 2006.  Mother had MI in 39s.    TTE on 07/25/2019 showed normal biventricular function, no significant valvular disease.  Zio patch x14 days on 08/21/2019 showed occasional PVCs (1.7% of beats).  Zio patch x4 days on 11/24/2020 showed occasional PVCs (2% of beats).  Since last clinic visit, she reports she is doing well.  States that she has been taking metoprolol and not having palpitations.  She denies any chest pain, dyspnea, lower extremity edema.  Does report some lightheadedness but denies any syncope.  She walks 15 to 20 minutes 3 days/week.  She is doing healthy weight and wellness, has lost 120 pounds.   Wt Readings from Last 3  Encounters:  07/06/23 (!) 330 lb 12.8 oz (150 kg)  06/25/23 (!) 327 lb 6.4 oz (148.5 kg)  06/20/23 (!) 321 lb (145.6 kg)     Past Medical History:  Diagnosis Date   ANEMIA 08/14/2008   Qualifier: Diagnosis of  By: Karn Pickler MD, Jessica     Anxiety    Asthma    B12 deficiency 08/29/2021   Bipolar 1 disorder (HCC)    Blood transfusion    Cellulitis 07/15/2020   Of right buttock   Chest pain    Depression    Edema of both lower extremities    GERD (gastroesophageal reflux disease)    History of bronchitis    Hypertension    Kidney stones 10/09/2011   Migraines 10/09/2011   "often"   Morbid obesity with BMI of 50.0-59.9, adult (HCC) 08/09/2006   Qualifier: Diagnosis of   By: Karn Pickler MD, Jessica         Pre-diabetes    Schizophrenia (HCC)    SOB (shortness of breath)    Vitamin D deficiency 08/29/2021    Past Surgical History:  Procedure Laterality Date   ANKLE FRACTURE SURGERY   1990's   "left; put screws in"   ENDOMETRIAL ABLATION  ~ 2011   FRACTURE SURGERY     GASTRIC BY PASS     TUBAL LIGATION  2006    Current Medications: Current Meds  Medication Sig   Accu-Chek  Softclix Lancets lancets USE TO test fasting blood sugar EVERY DAY IN THE MORNING   albuterol (VENTOLIN HFA) 108 (90 Base) MCG/ACT inhaler inhale 2 puffs BY MOUTH EVERY 4 HOURS AS NEEDED FOR wheezing OR SHORTNESS OF BREATH   atorvastatin (LIPITOR) 20 MG tablet Take 1 tablet (20 mg total) by mouth daily.   B Complex-C-Folic Acid (B COMPLEX-VITAMIN C-FOLIC ACID) 1 MG tablet Take 1 tablet by mouth daily with breakfast.   benztropine (COGENTIN) 1 MG tablet Take 1 mg by mouth 2 (two) times daily.   blood glucose meter kit and supplies KIT Dispense based on patient and insurance preference. Use up to four times daily as directed. (FOR ICD-9 250.00, 250.01).   Blood Glucose Monitoring Suppl DEVI 1 each by Does not apply route 2 (two) times daily. May substitute to any manufacturer covered by patient's  insurance. check sugars fasting and post-prandial daily.   busPIRone (BUSPAR) 7.5 MG tablet Take 7.5 mg by mouth 2 (two) times daily.   clotrimazole (LOTRIMIN) 1 % cream APPLY TO AFFECTED AREA TWICE A DAY   cyanocobalamin (VITAMIN B12) 1000 MCG tablet Take 1 tablet (1,000 mcg total) by mouth daily.   ferrous sulfate 325 (65 FE) MG tablet Take 325 mg by mouth in the morning and at bedtime.   FLUoxetine (PROZAC) 40 MG capsule Take 40 mg by mouth daily.   fluticasone (FLONASE) 50 MCG/ACT nasal spray place 2 SPRAYS in each nostril DAILY AS NEEDED FOR allergies OR RHINITIS   gabapentin (NEURONTIN) 300 MG capsule Take 1 capsule (300 mg total) by mouth 3 (three) times daily.   GOODSENSE ALLERGY RELIEF 10 MG tablet TAKE 1 Tablet BY MOUTH ONCE DAILY   guaiFENesin (MUCINEX) 600 MG 12 hr tablet Take by mouth 2 (two) times daily.   haloperidol (HALDOL) 2 MG tablet Take 2 mg by mouth daily.   hydrochlorothiazide (HYDRODIURIL) 12.5 MG tablet Take 1 tablet (12.5 mg total) by mouth daily.   hydrOXYzine (VISTARIL) 25 MG capsule Take 25 mg by mouth every evening. Every evening as needed   linaclotide (LINZESS) 72 MCG capsule Take 1 capsule (72 mcg total) by mouth daily before breakfast.   metFORMIN (GLUCOPHAGE) 500 MG tablet Take 1 tablet (500 mg total) by mouth 2 (two) times daily with a meal.   metoprolol tartrate (LOPRESSOR) 25 MG tablet TAKE ONE-HALF TABLET BY MOUTH TWICE DAILY AS NEEDED   OLANZapine (ZYPREXA) 20 MG tablet Take 20 mg by mouth at bedtime.    ondansetron (ZOFRAN) 4 MG tablet Take 1 tablet (4 mg total) by mouth every 8 (eight) hours as needed for nausea or vomiting.   Prenatal Vit-Fe Fum-FA-Omega (PNV PRENATAL PLUS MULTIVIT+DHA) 27-1 & 312 MG MISC 1 po qd   Probiotic Product (ACIDOPHILUS PROBIOTIC BLEND PO) Take by mouth.   Semaglutide, 1 MG/DOSE, 4 MG/3ML SOPN Inject 1 mg as directed once a week.   traZODone (DESYREL) 50 MG tablet Take 50 mg by mouth at bedtime as needed for sleep.    valsartan (DIOVAN) 80 MG tablet Take 1 tablet (80 mg total) by mouth at bedtime.   Vitamin D, Ergocalciferol, (DRISDOL) 1.25 MG (50000 UNIT) CAPS capsule 1 tab q wed and 1 tab q sun   [DISCONTINUED] nystatin (MYCOSTATIN/NYSTOP) powder Apply 1 Application topically 3 (three) times daily.     Allergies:   Sibutramine hcl monohydrate   Social History   Socioeconomic History   Marital status: Single    Spouse name: Not on file   Number of children:  Not on file   Years of education: Not on file   Highest education level: Not on file  Occupational History   Occupation: DISABLE  Tobacco Use   Smoking status: Former    Current packs/day: 0.00    Average packs/day: 0.3 packs/day for 4.0 years (1.0 ttl pk-yrs)    Types: Cigarettes    Start date: 06/12/2000    Quit date: 06/12/2004    Years since quitting: 19.0   Smokeless tobacco: Never  Vaping Use   Vaping status: Never Used  Substance and Sexual Activity   Alcohol use: No   Drug use: No   Sexual activity: Yes    Birth control/protection: None    Comment: Novasure  Other Topics Concern   Not on file  Social History Narrative   Not on file   Social Drivers of Health   Financial Resource Strain: Not on file  Food Insecurity: Not on file  Transportation Needs: Not on file  Physical Activity: Not on file  Stress: Not on file  Social Connections: Not on file     Family History: Mother had MI in 77s  ROS:   Please see the history of present illness.     All other systems reviewed and are negative.  EKGs/Labs/Other Studies Reviewed:    The following studies were reviewed today:   EKG:   07/06/2023: Normal sinus rhythm, heart rate 82, no ST abnormalities 11/11/2020- The EKG ordered demonstrates sinus tachycardia, rate 110, frequent PVCs 10/31/2019- The ekg ordered most recently demonstrates sinus tachycardia, rate 183, nonspecific T wave flattening  TTE 07/25/19:  1. Left ventricular ejection fraction, by estimation, is 60  to 65%. The  left ventricle has normal function. The left ventricle has no regional  wall motion abnormalities. Left ventricular diastolic parameters are  indeterminate.   2. Right ventricular systolic function is normal. The right ventricular  size is normal. Tricuspid regurgitation signal is inadequate for assessing  PA pressure.   3. The mitral valve is normal in structure and function. Trivial mitral  valve regurgitation. No evidence of mitral stenosis.   4. The aortic valve was not well visualized for morphology but is grossly  normal in function. Aortic valve regurgitation is not visualized. No  aortic stenosis is present.   Cardiac monitor 08/21/19: No significant arrhythmias detected Occasional PVCs (1.7% of beats). Longest bigeminy episode lasted 12 seconds, and longest trigeminy episode lasted 5 seconds. Patient triggered events corresponded to sinus rhythm +/- PVCs   14 days of data recorded on Zio monitor. Patient had a min HR of 60 bpm, max HR of 171 bpm, and avg HR of 85 bpm. Predominant underlying rhythm was Sinus Rhythm. No VT, SVT, atrial fibrillation, high degree block, or pauses noted. Isolated atrial ectopy was rare (<1%).  Isolated ventricular ectopy was occasional (1.7%).   Longest bigeminy episode lasted 12 seconds, and longest trigeminy episode lasted 5 seconds.  There were 11 triggered events, corresponding to sinus rhythm +/- PVCs. No significant arrhythmias detected.  Recent Labs: 09/06/2022: Hemoglobin 13.9; Platelets 388 04/11/2023: ALT 16; BUN 8; Creatinine, Ser 0.68; Magnesium 1.9; Potassium 3.9; Sodium 142; TSH 1.150  Recent Lipid Panel    Component Value Date/Time   CHOL 126 02/16/2023 1605   TRIG 62 02/16/2023 1605   HDL 57 02/16/2023 1605   CHOLHDL 2.2 02/16/2023 1605   CHOLHDL 3.8 12/09/2014 1102   VLDL 25 12/09/2014 1102   LDLCALC 56 02/16/2023 1605    Physical Exam:    VS:  BP 124/84 (BP Location: Left Arm, Patient Position: Sitting)   Pulse  82   Ht 6' (1.829 m)   Wt (!) 330 lb 12.8 oz (150 kg)   SpO2 99%   BMI 44.86 kg/m     Wt Readings from Last 3 Encounters:  07/06/23 (!) 330 lb 12.8 oz (150 kg)  06/25/23 (!) 327 lb 6.4 oz (148.5 kg)  06/20/23 (!) 321 lb (145.6 kg)     GEN:   in no acute distress HEENT: Normal NECK: No JVD CARDIAC:tachycardic, regular, no murmurs, rubs, gallops RESPIRATORY:  Clear to auscultation without rales, wheezing or rhonchi  ABDOMEN: Soft, non-tender, non-distended MUSCULOSKELETAL:  No edema SKIN: Warm and dry NEUROLOGIC:  Alert and oriented x 3 PSYCHIATRIC:  Normal affect   ASSESSMENT:    1. Palpitations   2. Essential hypertension, benign   3. Morbid obesity (HCC)   4. Hyperlipidemia, unspecified hyperlipidemia type      PLAN:    Palpitations: Describes episodes where it feels like heart is racing at rest, concerning for arrhythmia.  Zio patch x14 days showed no significant arrhythmias.  Did have occasional PVCs (1.7% of beats).  Frequent PVCs on EKG in clinic 11/11/20.  Zio patch x4 days on 11/24/2020 showed occasional PVCs (2% of beats). -On as needed metoprolol with improvement in symptoms, can continue  OSA: was on CPAP for less than 1 year.  Lost 120 lbs, repeat sleep study 07/2022 showed no significant OSA   Dyspnea on exertion: Suspect deconditioning is contributing.  No structural heart disease on TTE 07/2019  Chest pain: Description suggests noncardiac chest pain, as describes dull aching pain not related to exertion.  No recent episodes  Hypertension: on hydrochlorothiazide 12.5 mg daily and valsartan 80 mg daily.  Appears controlled  Hyperlipidemia: On atorvastatin 20 mg daily.  LDL 56 on 02/16/2023  Morbid obesity: Body mass index is 44.86 kg/m.  Referred to healthy weight and wellness.  On semaglutide.  Has lost 120 pounds.  RTC in 1 year   Medication Adjustments/Labs and Tests Ordered: Current medicines are reviewed at length with the patient today.  Concerns  regarding medicines are outlined above.  Orders Placed This Encounter  Procedures   Basic Metabolic Panel (BMET)   Magnesium   EKG 12-Lead   No orders of the defined types were placed in this encounter.   Patient Instructions  Medication Instructions:  Continue  current medication *If you need a refill on your cardiac medications before your next appointment, please call your pharmacy*   Lab Work: Bmet, Mg today If you have labs (blood work) drawn today and your tests are completely normal, you will receive your results only by: MyChart Message (if you have MyChart) OR A paper copy in the mail If you have any lab test that is abnormal or we need to change your treatment, we will call you to review the results.   Testing/Procedures: none   Follow-Up: At [facility], you and your health needs are our priority.  As part of our continuing mission to provide you with exceptional heart care, we have created designated Provider Care Teams.  These Care Teams include your primary Cardiologist (physician) and Advanced Practice Providers (APPs -  Physician Assistants and Nurse Practitioners) who all work together to provide you with the care you need, when you need it.  We recommend signing up for the patient portal called "MyChart".  Sign up information is provided on this After Visit Summary.  MyChart is used  to connect with patients for Virtual Visits (Telemedicine).  Patients are able to view lab/test results, encounter notes, upcoming appointments, etc.  Non-urgent messages can be sent to your provider as well.   To learn more about what you can do with MyChart, go to [email or web address].    Your next appointment:   1 year(s)  Provider:   Dr. Bjorn Pippin  Other Instructions none           Signed, Little Ishikawa, MD  07/06/2023 10:09 AM     [facility]

## 2023-07-06 ENCOUNTER — Encounter: Payer: Self-pay | Admitting: Cardiology

## 2023-07-06 ENCOUNTER — Telehealth: Payer: Self-pay | Admitting: Family Medicine

## 2023-07-06 ENCOUNTER — Ambulatory Visit: Payer: Medicaid Other | Attending: Cardiology | Admitting: Cardiology

## 2023-07-06 VITALS — BP 124/84 | HR 82 | Ht 72.0 in | Wt 330.8 lb

## 2023-07-06 DIAGNOSIS — R69 Illness, unspecified: Secondary | ICD-10-CM | POA: Diagnosis not present

## 2023-07-06 DIAGNOSIS — E785 Hyperlipidemia, unspecified: Secondary | ICD-10-CM | POA: Diagnosis not present

## 2023-07-06 DIAGNOSIS — I10 Essential (primary) hypertension: Secondary | ICD-10-CM | POA: Diagnosis not present

## 2023-07-06 DIAGNOSIS — R002 Palpitations: Secondary | ICD-10-CM

## 2023-07-06 MED ORDER — NYSTATIN 100000 UNIT/GM EX POWD: 1.0000 | Freq: Three times a day (TID) | CUTANEOUS | 11 refills | Status: AC

## 2023-07-06 NOTE — Patient Instructions (Signed)
Medication Instructions:  Continue  current medication *If you need a refill on your cardiac medications before your next appointment, please call your pharmacy*   Lab Work: Bmet, Mg today If you have labs (blood work) drawn today and your tests are completely normal, you will receive your results only by: MyChart Message (if you have MyChart) OR A paper copy in the mail If you have any lab test that is abnormal or we need to change your treatment, we will call you to review the results.   Testing/Procedures: none   Follow-Up: At [facility], you and your health needs are our priority.  As part of our continuing mission to provide you with exceptional heart care, we have created designated Provider Care Teams.  These Care Teams include your primary Cardiologist (physician) and Advanced Practice Providers (APPs -  Physician Assistants and Nurse Practitioners) who all work together to provide you with the care you need, when you need it.  We recommend signing up for the patient portal called "MyChart".  Sign up information is provided on this After Visit Summary.  MyChart is used to connect with patients for Virtual Visits (Telemedicine).  Patients are able to view lab/test results, encounter notes, upcoming appointments, etc.  Non-urgent messages can be sent to your provider as well.   To learn more about what you can do with MyChart, go to [email or web address].    Your next appointment:   1 year(s)  Provider:   Dr. Bjorn Pippin  Other Instructions none

## 2023-07-06 NOTE — Telephone Encounter (Signed)
Patient calling requesting a new referral to be sent to an OBGYN in [place] instead of [employer]. Patients insurance has changed to [facility] [place]. Not sure if a new referral will actually need placed or not. Routing to PCP and referral coordinator.

## 2023-07-07 LAB — BASIC METABOLIC PANEL
BUN/Creatinine Ratio: 17 (ref 9–23)
BUN: 13 mg/dL (ref 6–24)
CO2: 27 mmol/L (ref 20–29)
Calcium: 9.5 mg/dL (ref 8.7–10.2)
Chloride: 101 mmol/L (ref 96–106)
Creatinine, Ser: 0.75 mg/dL (ref 0.57–1.00)
Glucose: 83 mg/dL (ref 70–99)
Potassium: 4.6 mmol/L (ref 3.5–5.2)
Sodium: 142 mmol/L (ref 134–144)
eGFR: 99 mL/min/{1.73_m2} (ref >59)

## 2023-07-07 LAB — MAGNESIUM: Magnesium: 2 mg/dL (ref 1.6–2.3)

## 2023-07-09 ENCOUNTER — Encounter: Payer: Self-pay | Admitting: *Deleted

## 2023-07-09 DIAGNOSIS — R69 Illness, unspecified: Secondary | ICD-10-CM | POA: Diagnosis not present

## 2023-07-10 DIAGNOSIS — R69 Illness, unspecified: Secondary | ICD-10-CM | POA: Diagnosis not present

## 2023-07-11 DIAGNOSIS — R69 Illness, unspecified: Secondary | ICD-10-CM | POA: Diagnosis not present

## 2023-07-12 ENCOUNTER — Other Ambulatory Visit: Payer: Self-pay | Admitting: Family Medicine

## 2023-07-12 DIAGNOSIS — R69 Illness, unspecified: Secondary | ICD-10-CM | POA: Diagnosis not present

## 2023-07-12 DIAGNOSIS — G5793 Unspecified mononeuropathy of bilateral lower limbs: Secondary | ICD-10-CM

## 2023-07-13 DIAGNOSIS — R69 Illness, unspecified: Secondary | ICD-10-CM | POA: Diagnosis not present

## 2023-07-16 DIAGNOSIS — R269 Unspecified abnormalities of gait and mobility: Secondary | ICD-10-CM | POA: Diagnosis not present

## 2023-07-16 DIAGNOSIS — R69 Illness, unspecified: Secondary | ICD-10-CM | POA: Diagnosis not present

## 2023-07-16 DIAGNOSIS — E119 Type 2 diabetes mellitus without complications: Secondary | ICD-10-CM | POA: Diagnosis not present

## 2023-07-16 DIAGNOSIS — R3981 Functional urinary incontinence: Secondary | ICD-10-CM | POA: Diagnosis not present

## 2023-07-17 ENCOUNTER — Encounter: Payer: Self-pay | Admitting: Family Medicine

## 2023-07-17 ENCOUNTER — Ambulatory Visit (INDEPENDENT_AMBULATORY_CARE_PROVIDER_SITE_OTHER): Payer: Medicaid Other | Admitting: Family Medicine

## 2023-07-17 ENCOUNTER — Other Ambulatory Visit: Payer: Self-pay

## 2023-07-17 VITALS — BP 91/65 | Ht 73.0 in | Wt 315.0 lb

## 2023-07-17 DIAGNOSIS — R69 Illness, unspecified: Secondary | ICD-10-CM | POA: Diagnosis not present

## 2023-07-17 DIAGNOSIS — M25372 Other instability, left ankle: Secondary | ICD-10-CM

## 2023-07-17 DIAGNOSIS — M19172 Post-traumatic osteoarthritis, left ankle and foot: Secondary | ICD-10-CM

## 2023-07-17 DIAGNOSIS — M722 Plantar fascial fibromatosis: Secondary | ICD-10-CM

## 2023-07-17 MED ORDER — METHYLPREDNISOLONE ACETATE 40 MG/ML IJ SUSP
40.0000 mg | Freq: Once | INTRAMUSCULAR | Status: AC
  Administered 2023-07-17: 40 mg via INTRA_ARTICULAR

## 2023-07-17 MED ORDER — MELOXICAM 15 MG PO TABS: 15.0000 mg | ORAL_TABLET | Freq: Every day | ORAL | 0 refills | Status: DC

## 2023-07-17 NOTE — Progress Notes (Signed)
 PCP: Adele Song, MD  Chief Complaint: left ankle and foot pain Subjective:   HPI: Patient is a 48 y.o. female here for Left ankle and foot pain.  Patient had an x-ray in October of last year which shows significant osteoarthritic changes.  Patient states that she broke her foot many years ago and had surgery and then eventually had the hardware removed.  Patient states that the pain in her ankle is usually worse at the end of the day.  Patient states that she also has some swelling in her left ankle.  Patient does not have any pain in her right ankle at all the pain is located the left side.  Patient also notes that she is been diagnosed with plantar fasciitis.  Patient is not done anything for the plantar fasciitis on the left side and states that her pain is all located over the arch of her foot.  Patient otherwise has no other concerns at this time..   Past Medical History:  Diagnosis Date   ANEMIA 08/14/2008   Qualifier: Diagnosis of  By: Joyice MD, Jessica     Anxiety    Asthma    B12 deficiency 08/29/2021   Bipolar 1 disorder (HCC)    Blood transfusion    Cellulitis 07/15/2020   Of right buttock   Chest pain    Depression    Edema of both lower extremities    GERD (gastroesophageal reflux disease)    History of bronchitis    Hypertension    Kidney stones 10/09/2011   Migraines 10/09/2011   often   Morbid obesity with BMI of 50.0-59.9, adult (HCC) 08/09/2006   Qualifier: Diagnosis of   By: Joyice MD, Jessica         Pre-diabetes    Schizophrenia (HCC)    SOB (shortness of breath)    Vitamin D  deficiency 08/29/2021    Current Outpatient Medications on File Prior to Visit  Medication Sig Dispense Refill   Accu-Chek Softclix Lancets lancets USE TO test fasting blood sugar EVERY DAY IN THE MORNING 100 each 6   albuterol  (VENTOLIN  HFA) 108 (90 Base) MCG/ACT inhaler inhale 2 puffs BY MOUTH EVERY 4 HOURS AS NEEDED FOR wheezing OR SHORTNESS OF BREATH 18 g 1   atorvastatin   (LIPITOR) 20 MG tablet Take 1 tablet (20 mg total) by mouth daily. 90 tablet 3   B Complex-C-Folic Acid  (B COMPLEX-VITAMIN C-FOLIC ACID ) 1 MG tablet Take 1 tablet by mouth daily with breakfast. 30 tablet 1   benztropine (COGENTIN) 1 MG tablet Take 1 mg by mouth 2 (two) times daily.     blood glucose meter kit and supplies KIT Dispense based on patient and insurance preference. Use up to four times daily as directed. (FOR ICD-9 250.00, 250.01). 1 each 0   Blood Glucose Monitoring Suppl DEVI 1 each by Does not apply route 2 (two) times daily. May substitute to any manufacturer covered by patient's insurance. check sugars fasting and post-prandial daily. 1 each 0   busPIRone (BUSPAR) 7.5 MG tablet Take 7.5 mg by mouth 2 (two) times daily.     clotrimazole  (LOTRIMIN ) 1 % cream APPLY TO AFFECTED AREA TWICE A DAY 30 g 0   cyanocobalamin  (VITAMIN B12) 1000 MCG tablet Take 1 tablet (1,000 mcg total) by mouth daily.     ferrous sulfate  325 (65 FE) MG tablet Take 325 mg by mouth in the morning and at bedtime.     FLUoxetine (PROZAC) 40 MG capsule Take 40 mg by  mouth daily.     fluticasone  (FLONASE ) 50 MCG/ACT nasal spray place 2 SPRAYS in each nostril DAILY AS NEEDED FOR allergies OR RHINITIS 16 g 0   gabapentin  (NEURONTIN ) 300 MG capsule Take 1 capsule (300 mg total) by mouth 3 (three) times daily. 90 capsule 1   GOODSENSE ALLERGY RELIEF 10 MG tablet TAKE 1 Tablet BY MOUTH ONCE DAILY 90 tablet 0   guaiFENesin  (MUCINEX ) 600 MG 12 hr tablet Take by mouth 2 (two) times daily.     haloperidol (HALDOL) 2 MG tablet Take 2 mg by mouth daily.     hydrochlorothiazide  (HYDRODIURIL ) 12.5 MG tablet Take 1 tablet (12.5 mg total) by mouth daily. 90 tablet 3   hydrOXYzine  (VISTARIL ) 25 MG capsule Take 25 mg by mouth every evening. Every evening as needed     linaclotide  (LINZESS ) 72 MCG capsule Take 1 capsule (72 mcg total) by mouth daily before breakfast. 30 capsule 1   meloxicam  (MOBIC ) 15 MG tablet Take 1 tablet (15  mg total) by mouth daily. (Patient not taking: Reported on 07/06/2023) 30 tablet 2   metFORMIN  (GLUCOPHAGE ) 500 MG tablet Take 1 tablet (500 mg total) by mouth 2 (two) times daily with a meal. 60 tablet 1   metoprolol  tartrate (LOPRESSOR ) 25 MG tablet TAKE ONE-HALF TABLET BY MOUTH TWICE DAILY AS NEEDED 90 tablet 3   nystatin  (MYCOSTATIN /NYSTOP ) powder Apply 1 Application topically 3 (three) times daily. 15 g 11   OLANZapine (ZYPREXA) 20 MG tablet Take 20 mg by mouth at bedtime.      ondansetron  (ZOFRAN ) 4 MG tablet Take 1 tablet (4 mg total) by mouth every 8 (eight) hours as needed for nausea or vomiting. 20 tablet 0   Prenatal Vit-Fe Fum-FA-Omega (PNV PRENATAL PLUS MULTIVIT+DHA) 27-1 & 312 MG MISC 1 po qd 30 each 1   Probiotic Product (ACIDOPHILUS PROBIOTIC BLEND PO) Take by mouth.     Semaglutide , 1 MG/DOSE, 4 MG/3ML SOPN Inject 1 mg as directed once a week. 3 mL 0   traZODone (DESYREL) 50 MG tablet Take 50 mg by mouth at bedtime as needed for sleep.     valsartan  (DIOVAN ) 80 MG tablet Take 1 tablet (80 mg total) by mouth at bedtime. 90 tablet 3   Vitamin D , Ergocalciferol , (DRISDOL ) 1.25 MG (50000 UNIT) CAPS capsule 1 tab q wed and 1 tab q sun 8 capsule 1   No current facility-administered medications on file prior to visit.    Past Surgical History:  Procedure Laterality Date   ANKLE FRACTURE SURGERY   1990's   left; put screws in   ENDOMETRIAL ABLATION  ~ 2011   FRACTURE SURGERY     GASTRIC BY PASS     TUBAL LIGATION  2006    Allergies  Allergen Reactions   Sibutramine Hcl Monohydrate Hives    BP 91/65   Ht 6' 1 (1.854 m)   Wt (!) 315 lb (142.9 kg)   BMI 41.56 kg/m       No data to display              No data to display              Objective:  Physical Exam:  Gen: NAD, comfortable in exam room  Ankle/Foot, Left: . No visible erythema or bony deformity. There is noted swelling of the left ankle. Notable pes cavus deformity. Transverse arch grossly  intact; Normal eversion/inversion stress testing. Range of motion is full in all directions. Strength is  5/5 in all directions. No tenderness on posterior aspects of lateral and medial malleolus; Stable lateral and medial ligaments; Talar dome nontender; No plantar calcaneal tenderness; No tenderness over the navicular prominence or base of the 5th MT; No tenderness over cuboid; No tenderness at the distal metatarsals; Able to walk 4 steps.  Provocative Testing:   - Anterior Drawer: NEG  - Talar Tilt: NEG  - Kleiger's Test: NEG  - Calcaneal Squeeze Test: NEG     Assessment & Plan:  1. 1. Osteoarthritis of left ankle joint (Primary) - Patient's x-ray do show significant spurring as well as some osteoarthritic changes.  After discussion with patient, patient would likely benefit from steroid injection.  Patient is agreeable to injection would like to proceed today with injection.  Advised patient that she may continue to keep the ankle wrapped with some sort of compression as that will help with the swelling she is dealing with.  Ace bandage applied for patient. -Patient is also dealing with plantar fasciitis, at this time we will go ahead and have patient do the exercises as well as as needed meloxicam  for the plantar fasciitis.  If no improvement can consider custom orthotics or over-the-counter orthotics - US  LIMITED JOINT SPACE STRUCTURES LOW LEFT; Future  After obtaining consent patient was positioned comfortably and area/skin was cleaned with alcohol swabs as well as chlorhexidine.. A 25 gauge needle is used to inject 5 mL of 1% lidocaine  for local anesthesia, into the joint.  After subsequent time, a 22 inch needle was injected into the joint containing 2 mL of 1% lidocaine  and 1 mL of Depo-Medrol  (methylprednisolone  acetate) for anti-inflammatory effect, with the needle guided into the joint space under real-time ultrasound.  Needle was visualized with injectate going into the joint and photos  were taken and saved into the patient's chart.  The procedure is monitored for proper placement, and once the medications are injected, gentle pressure is applied to the site. Post-procedure, the patient is observed  for any immediate reactions, and given rest instructions for 24 hours. Documentation of the procedure and possible complications such as pain, infection, or joint flare were noted, with follow-up as needed.    Jennifer Holland MD, PGY-4  Sports Medicine Fellow [facility]

## 2023-07-18 ENCOUNTER — Ambulatory Visit (INDEPENDENT_AMBULATORY_CARE_PROVIDER_SITE_OTHER): Payer: Medicaid Other | Admitting: Family Medicine

## 2023-07-18 ENCOUNTER — Encounter (INDEPENDENT_AMBULATORY_CARE_PROVIDER_SITE_OTHER): Payer: Self-pay | Admitting: Family Medicine

## 2023-07-18 VITALS — BP 109/75 | HR 92 | Temp 98.1°F | Ht 73.0 in | Wt 323.0 lb

## 2023-07-18 DIAGNOSIS — E559 Vitamin D deficiency, unspecified: Secondary | ICD-10-CM

## 2023-07-18 DIAGNOSIS — Z7985 Long-term (current) use of injectable non-insulin antidiabetic drugs: Secondary | ICD-10-CM

## 2023-07-18 DIAGNOSIS — Z6841 Body Mass Index (BMI) 40.0 and over, adult: Secondary | ICD-10-CM | POA: Diagnosis not present

## 2023-07-18 DIAGNOSIS — E538 Deficiency of other specified B group vitamins: Secondary | ICD-10-CM

## 2023-07-18 DIAGNOSIS — K5909 Other constipation: Secondary | ICD-10-CM

## 2023-07-18 DIAGNOSIS — Z7984 Long term (current) use of oral hypoglycemic drugs: Secondary | ICD-10-CM | POA: Diagnosis not present

## 2023-07-18 DIAGNOSIS — R69 Illness, unspecified: Secondary | ICD-10-CM | POA: Diagnosis not present

## 2023-07-18 DIAGNOSIS — D508 Other iron deficiency anemias: Secondary | ICD-10-CM

## 2023-07-18 DIAGNOSIS — E1169 Type 2 diabetes mellitus with other specified complication: Secondary | ICD-10-CM

## 2023-07-18 MED ORDER — VITAMIN D (ERGOCALCIFEROL) 1.25 MG (50000 UNIT) PO CAPS: ORAL_CAPSULE | ORAL | 0 refills | Status: DC

## 2023-07-18 MED ORDER — LINACLOTIDE 72 MCG PO CAPS: 72.0000 ug | ORAL_CAPSULE | Freq: Every day | ORAL | 0 refills | Status: DC

## 2023-07-18 MED ORDER — PNV PRENATAL PLUS MULTIVIT+DHA 27-1 & 312 MG PO MISC: ORAL | 1 refills | Status: DC

## 2023-07-18 MED ORDER — SEMAGLUTIDE (1 MG/DOSE) 4 MG/3ML ~~LOC~~ SOPN: 1.0000 mg | PEN_INJECTOR | SUBCUTANEOUS | 0 refills | Status: DC

## 2023-07-18 MED ORDER — METFORMIN HCL 500 MG PO TABS: 500.0000 mg | ORAL_TABLET | Freq: Two times a day (BID) | ORAL | 0 refills | Status: DC

## 2023-07-18 MED ORDER — NEPHRO-VITE RX 1 MG PO TABS: 1.0000 | ORAL_TABLET | Freq: Every day | ORAL | 1 refills | Status: DC

## 2023-07-18 NOTE — Patient Instructions (Signed)

## 2023-07-18 NOTE — Progress Notes (Signed)
 Karen Brennan, D.O.  ABFM, ABOM Specializing in Clinical Bariatric Medicine  Office located at: [address] [place], [place]  [address]   Assessment and Plan:   FOR THE DISEASE OF OBESITY: BMI 40.0-44.9, adult (HCC)- current BMI 42.62 Morbid obesity (HCC)-starting bmi 58.45/date 02/01/21 Assessment & Plan: Since last office visit on 06/20/2023 patient's Muscle mass is 172.6 lbs. Fat mass is 141.2 lbs. Total body water is 114.4 lbs.  Counseling done on how various foods will affect these numbers and how to maximize success  Total lbs lost to date: 120 lbs Total weight loss percentage to date: 27.09%    Recommended Dietary Goals Karen Brennan is currently in the action stage of change. As such, her goal is to continue weight management plan.  She has agreed to: continue current plan   Behavioral Intervention We discussed the following today: work on meal planning and preparation and using GPT or another AI platform for recipe ideas- searching low calorie, low carb, high protein chicken recipes etc  Additional resources provided today:  Handout on Chat-GPT inspired recipes  Evidence-based interventions for health behavior change were utilized today including the discussion of self monitoring techniques, problem-solving barriers and SMART goal setting techniques.   Regarding patient's less desirable eating habits and patterns, we employed the technique of small changes.   Pt will specifically work on: increasing protein intake   Recommended Physical Activity Goals [name] has been advised to work up to 150 minutes of moderate intensity aerobic activity a week and strengthening exercises 2-3 times per week for cardiovascular health, weight loss maintenance and preservation of muscle mass.   She has agreed to : go to [facility] 3 days a week   Pharmacotherapy We both agreed to : continue with nutritional and behavioral strategies and continue AOMs   FOR ASSOCIATED CONDITIONS  ADDRESSED TODAY:  Type 2 diabetes mellitus with obesity (HCC) Assessment & Plan: Relevant medications: Metformin  500 mg twice daily and Ozempic  1 mg weekly. She is tolerating both medications well - denies gastrointestinal issues. Maintain with all current medications. Continue working on nutrition plan to decrease simple carbohydrates, increase lean proteins and exercise to promote weight loss and improve glycemic control. Will recheck fasting blood work next OV.   Orders:  -     metFORMIN  HCl; Take 1 tablet (500 mg total) by mouth 2 (two) times daily with a meal.  Dispense: 180 tablet; Refill: 0 -     Semaglutide  (1 MG/DOSE); Inject 1 mg as directed once a week.  Dispense: 3 mL; Refill: 0   B12 deficiency Assessment & Plan: Lab Results  Component Value Date   VITAMINB12 325 04/11/2023   B12 level was 325 on 04/11/23 - not at goal of over 500. Pt does not think she's been taking any B12 supplement. Pt instructed to start OTC B12 1,000 mcg daily. Will continue to monitor expectantly.    Vitamin D  deficiency Assessment & Plan: Pt is on prescription high dose vitamin D  twice weekly without any complications. Continue vitamin D  supplementation. Recheck levels next OV.   Orders:  -     Vitamin D  (Ergocalciferol ); 1 tab q wed and 1 tab q sun  Dispense: 24 capsule; Refill: 0   Other iron deficiency anemia Assessment & Plan: Relevant supplements: Ferrous Sulfate  325 mg twice daily & Prenatal Vitamin. Continue supplementation regiment. Continue iron-rich prudent nutritional plan.   Orders:  -     PNV Prenatal Plus Multivit+DHA; 1 po qd  Dispense: 90  each; Refill: 1   Other constipation Assessment & Plan: Pt has no acute concerns today. She requests refill for her Linzess . Will refill. Continue to monitor condition.   Orders: -     linaCLOtide ; Take 1 capsule (72 mcg total) by mouth daily before breakfast.  Dispense: 90 capsule; Refill: 0   Follow up:   Return 08/08/2023. She was  informed of the importance of frequent follow up visits to maximize her success with intensive lifestyle modifications for her multiple health conditions.  Subjective:   Chief complaint: Obesity [name] is here to discuss her progress with her obesity treatment plan. She is on the Category 3 Plan and states she is following her eating plan approximately 50% of the time. She states she is walking 15 minutes 2-3 days per week.  Interval History:  Karen Brennan is here for a follow up office visit. Since last OV on 06/20/2023, Karen Brennan is up 2 lbs. She admits to not getting the adequate amounts of lean protein. She also reports having sweets cravings for chocolate.   Pharmacotherapy for weight loss: She is currently taking Metformin  500 mg twice daily and Ozempic  1 mg once a week.  Review of Systems:  Pertinent positives were addressed with patient today.  Reviewed by clinician on day of visit: allergies, medications, problem list, medical history, surgical history, family history, social history, and previous encounter notes.  Weight Summary and Biometrics   Weight Lost Since Last Visit: 0  Weight Gained Since Last Visit: 2lb   Vitals Temp: 98.1 F (36.7 C) BP: 109/75 Pulse Rate: 92 SpO2: 100 %   Anthropometric Measurements Height: 6' 1 (1.854 m) Weight: (!) 323 lb (146.5 kg) BMI (Calculated): 42.62 Weight at Last Visit: 321lb Weight Lost Since Last Visit: 0 Weight Gained Since Last Visit: 2lb Starting Weight: 443lb Total Weight Loss (lbs): 120 lb (54.4 kg) Peak Weight: 505lb   Body Composition  Body Fat %: 43.7 % Fat Mass (lbs): 141.2 lbs Muscle Mass (lbs): 172.6 lbs Total Body Water (lbs): 114.4 lbs Visceral Fat Rating : 13   Other Clinical Data Fasting: no Labs: no Today's Visit #: 36 Starting Date: 02/01/21   Objective:   PHYSICAL EXAM: Blood pressure 109/75, pulse 92, temperature 98.1 F (36.7 C), height 6' 1 (1.854 m), weight (!) 323 lb (146.5 kg), SpO2  100%. Body mass index is 42.61 kg/m.  General: she is overweight, cooperative and in no acute distress. PSYCH: Has normal mood, affect and thought process.   HEENT: EOMI, sclerae are anicteric. Lungs: Normal breathing effort, no conversational dyspnea. Extremities: Moves * 4 Neurologic: A and O * 3, good insight  DIAGNOSTIC DATA REVIEWED: BMET    Component Value Date/Time   NA 142 07/06/2023 0954   K 4.6 07/06/2023 0954   CL 101 07/06/2023 0954   CO2 27 07/06/2023 0954   GLUCOSE 83 07/06/2023 0954   GLUCOSE 208 (H) 07/04/2022 1037   BUN 13 07/06/2023 0954   CREATININE 0.75 07/06/2023 0954   CREATININE 0.82 07/05/2016 0942   CALCIUM  9.5 07/06/2023 0954   GFRNONAA >60 07/04/2022 1037   GFRNONAA >89 07/05/2016 0942   GFRAA 99 02/12/2020 1423   GFRAA >89 07/05/2016 0942   Lab Results  Component Value Date   HGBA1C 5.5 04/11/2023   HGBA1C 5.7 09/15/2011   Lab Results  Component Value Date   INSULIN  11.0 01/12/2022   INSULIN  22.7 02/01/2021   Lab Results  Component Value Date   TSH 1.150 04/11/2023  CBC    Component Value Date/Time   WBC 6.6 09/06/2022 1057   WBC 10.4 07/04/2022 1037   RBC 4.78 09/06/2022 1057   RBC 4.76 07/04/2022 1037   HGB 13.9 09/06/2022 1057   HCT 42.8 09/06/2022 1057   PLT 388 09/06/2022 1057   MCV 90 09/06/2022 1057   MCH 29.1 09/06/2022 1057   MCH 29.6 07/04/2022 1037   MCHC 32.5 09/06/2022 1057   MCHC 32.5 07/04/2022 1037   RDW 12.2 09/06/2022 1057   Iron Studies    Component Value Date/Time   IRON 30 06/20/2021 1205   TIBC 444 06/20/2021 1205   FERRITIN 8 (L) 06/20/2021 1205   IRONPCTSAT 7 (LL) 06/20/2021 1205   Lipid Panel     Component Value Date/Time   CHOL 126 02/16/2023 1605   TRIG 62 02/16/2023 1605   HDL 57 02/16/2023 1605   CHOLHDL 2.2 02/16/2023 1605   CHOLHDL 3.8 12/09/2014 1102   VLDL 25 12/09/2014 1102   LDLCALC 56 02/16/2023 1605   Hepatic Function Panel     Component Value Date/Time   PROT 6.3  04/11/2023 1050   ALBUMIN 4.1 04/11/2023 1050   AST 20 04/11/2023 1050   ALT 16 04/11/2023 1050   ALKPHOS 56 04/11/2023 1050   BILITOT 1.1 04/11/2023 1050   BILIDIR 0.1 02/02/2009 2254   IBILI 0.6 02/02/2009 2254      Component Value Date/Time   TSH 1.150 04/11/2023 1050   Nutritional Lab Results  Component Value Date   VD25OH 34.9 04/11/2023   VD25OH 45.6 10/02/2022   VD25OH 46.1 01/12/2022    Attestations:   I, Special Puri, acting as a stage manager for [employer], DO., have compiled all relevant documentation for today's office visit on behalf of Karen Jenkins, DO, while in the presence of [employer], DO.  I have reviewed the above documentation for accuracy and completeness, and I agree with the above. Karen JINNY Brennan, D.O.  The 21st Century Cures Act was signed into law in 2016 which includes the topic of electronic health records.  This provides immediate access to information in MyChart.  This includes consultation notes, operative notes, office notes, lab results and pathology reports.  If you have any questions about what you read please let us  know at your next visit so we can discuss your concerns and take corrective action if need be.  We are right here with you.

## 2023-07-19 DIAGNOSIS — R69 Illness, unspecified: Secondary | ICD-10-CM | POA: Diagnosis not present

## 2023-07-20 DIAGNOSIS — R69 Illness, unspecified: Secondary | ICD-10-CM | POA: Diagnosis not present

## 2023-07-23 DIAGNOSIS — R69 Illness, unspecified: Secondary | ICD-10-CM | POA: Diagnosis not present

## 2023-07-24 ENCOUNTER — Ambulatory Visit (INDEPENDENT_AMBULATORY_CARE_PROVIDER_SITE_OTHER): Payer: Medicaid Other | Admitting: Student

## 2023-07-24 VITALS — BP 117/73 | HR 92 | Ht 73.0 in | Wt 322.0 lb

## 2023-07-24 DIAGNOSIS — D649 Anemia, unspecified: Secondary | ICD-10-CM | POA: Diagnosis not present

## 2023-07-24 DIAGNOSIS — R42 Dizziness and giddiness: Secondary | ICD-10-CM

## 2023-07-24 DIAGNOSIS — R69 Illness, unspecified: Secondary | ICD-10-CM | POA: Diagnosis not present

## 2023-07-24 MED ORDER — MECLIZINE HCL 12.5 MG PO TABS: 12.5000 mg | ORAL_TABLET | Freq: Three times a day (TID) | ORAL | 0 refills | Status: DC | PRN

## 2023-07-24 NOTE — Patient Instructions (Addendum)
It was great to see you today! Thank you for choosing [facility] for your primary care.  Today we addressed: Please monitor BP if able  Return in 1-2 weeks if not improving  Please contact [facility] to discuss medication changes  Antivert 12.5 three times a day as needed  Stay hydrated    If you haven't already, sign up for My Chart to have easy access to your labs results, and communication with your primary care physician. We are checking some labs today. If they are abnormal, I will call you. If they are normal, I will send you a MyChart message (if it is active) or a letter in the mail. If you do not hear about your labs in the next 2 weeks, please call the office. I recommend that you always bring your medications to each appointment as this makes it easy to ensure you are on the correct medications and helps [place] not miss refills when you need them. Call the clinic at [phone] if your symptoms worsen or you have any concerns.  Please arrive 15 minutes before your appointment to ensure smooth check in process.  We appreciate your efforts in making this happen.  Thank you for allowing me to participate in your care, Alfredo Martinez, MD 07/24/2023, 10:49 AM PGY-3, [facility]

## 2023-07-24 NOTE — Progress Notes (Signed)
    SUBJECTIVE:   CHIEF COMPLAINT / HPI:   Dizziness  Balance Concerns:  - Ongoing x 2 weeks  - worried about vertigo  - no recent illness  - Has history of palpitations for which she sees cardiology  - Last cards visit 07/06/23, "Zio patch x14 days showed no significant arrhythmias. Did have occasional PVCs (1.7% of beats). Frequent PVCs on EKG in clinic 11/11/20. Zio patch x4 days on 11/24/2020 showed occasional PVCs (2% of beats)." Uses Metoprolol intermittently - Recently started on abilify with Dr. Wilson Singer through [place]  - When getting up or starting walking makes the dizziness worse  - Larey Seat twice, no syncope or head trauma  - No nausea or vomiting  - On Metformin and Ozempic for DM2  - History of Anemia  - Has had unilateral weakness in the past last year after flu vaccine but  - History of HTN- hydrochlorothiazide 12.5 mg daily and valsartan 80 mg daily    PERTINENT  PMH / PSH:  Reviewed   OBJECTIVE:   BP 117/73   Pulse 92   Ht 6\' 1"  (1.854 m)   Wt (!) 322 lb (146.1 kg)   SpO2 98%   BMI 42.48 kg/m   General: Alert and oriented in no apparent distress Head: [place]/AT.   Eyes: EOMI Ears:  External ears WNL, Bilateral TM's normal without retraction, redness or bulging. Nose:  Septum midline  Heart: Regular rate and rhythm with no murmurs appreciated Lungs: CTA bilaterally, no wheezing Abdomen: Bowel sounds present, no abdominal pain Skin: Warm and dry Neuro: CN III, IV,VI: EOMI CV V: Normal sensation in V1, V2, V3 CVII: Symmetric smile and brow raise CN VIII: Normal hearing CN XI: 5/5 shoulder shrug CN XII: Symmetric tongue protrusion  UE and LE strength 5/5 Normal sensation in UE and LE bilaterally   Negative Rhomberg     ASSESSMENT/PLAN:   Assessment & Plan Dizziness Ddx: BPPV --changes in head positioning (patient reports that she does in fact have these symptoms), vestibular neuritis - no recent viral illness, [place]ie[place] - no hearing changes, Migraine- no  history of migraines or current headaches, orthostatic hypotension - borderline positive results, stroke/TIA-no appearance of a central lesion with normal neurological exam, Cardiac arrhythmia - history of PVCs on BB without any palpitations noted by patient (2% burden on last Zio). Stay hydrated, cannot check BP at home. Possible that antipsychotics are contributing but unsure, instructed to call [facility] about symptoms to discuss. Updated med list. No evidence of hypoglycemia without other symptoms. Antivert x 20 tabs in case of vertigo. Strict return precautions. See pcp in 1-2 weeks if not improving.  Anemia, unspecified type H/o, repeat CBC      Alfredo Martinez, MD [facility]

## 2023-07-25 ENCOUNTER — Other Ambulatory Visit: Payer: Self-pay | Admitting: Family Medicine

## 2023-07-25 ENCOUNTER — Encounter: Payer: Self-pay | Admitting: Student

## 2023-07-25 DIAGNOSIS — R69 Illness, unspecified: Secondary | ICD-10-CM | POA: Diagnosis not present

## 2023-07-25 DIAGNOSIS — M722 Plantar fascial fibromatosis: Secondary | ICD-10-CM

## 2023-07-25 LAB — CBC WITH DIFFERENTIAL/PLATELET
Basophils Absolute: 0 10*3/uL (ref 0.0–0.2)
Basos: 1 %
EOS (ABSOLUTE): 0 10*3/uL (ref 0.0–0.4)
Eos: 1 %
Hematocrit: 40.6 % (ref 34.0–46.6)
Hemoglobin: 13.3 g/dL (ref 11.1–15.9)
Immature Grans (Abs): 0 10*3/uL (ref 0.0–0.1)
Immature Granulocytes: 0 %
Lymphocytes Absolute: 3.8 10*3/uL — ABNORMAL HIGH (ref 0.7–3.1)
Lymphs: 43 %
MCH: 29.3 pg (ref 26.6–33.0)
MCHC: 32.8 g/dL (ref 31.5–35.7)
MCV: 89 fL (ref 79–97)
Monocytes Absolute: 0.4 10*3/uL (ref 0.1–0.9)
Monocytes: 5 %
Neutrophils Absolute: 4.4 10*3/uL (ref 1.4–7.0)
Neutrophils: 50 %
Platelets: 357 10*3/uL (ref 150–450)
RBC: 4.54 x10E6/uL (ref 3.77–5.28)
RDW: 11.8 % (ref 11.7–15.4)
WBC: 8.7 10*3/uL (ref 3.4–10.8)

## 2023-07-26 ENCOUNTER — Telehealth: Payer: Medicaid Other | Admitting: Family Medicine

## 2023-07-26 DIAGNOSIS — R3989 Other symptoms and signs involving the genitourinary system: Secondary | ICD-10-CM | POA: Diagnosis not present

## 2023-07-26 DIAGNOSIS — G5793 Unspecified mononeuropathy of bilateral lower limbs: Secondary | ICD-10-CM | POA: Diagnosis not present

## 2023-07-26 MED ORDER — CEPHALEXIN 500 MG PO CAPS: 500.0000 mg | ORAL_CAPSULE | Freq: Two times a day (BID) | ORAL | 0 refills | Status: AC

## 2023-07-26 NOTE — Patient Instructions (Signed)
Karen Brennan, thank you for joining Freddy Finner, NP for today's virtual visit.  While this provider is not your primary care provider (PCP), if your PCP is located in our provider database this encounter information will be shared with them immediately following your visit.   A [facility] MyChart account gives you access to today's visit and all your visits, tests, and labs performed at [facility] " click here if you don't have a [facility] MyChart account or go to [email or web address]  Consent: (Patient) Karen Brennan provided verbal consent for this virtual visit at the beginning of the encounter.  Current Medications:  Current Outpatient Medications:    cephALEXin (KEFLEX) 500 MG capsule, Take 1 capsule (500 mg total) by mouth 2 (two) times daily for 7 days., Disp: 14 capsule, Rfl: 0   Accu-Chek Softclix Lancets lancets, USE TO test fasting blood sugar EVERY DAY IN THE MORNING, Disp: 100 each, Rfl: 6   albuterol (VENTOLIN HFA) 108 (90 Base) MCG/ACT inhaler, inhale 2 puffs BY MOUTH EVERY 4 HOURS AS NEEDED FOR wheezing OR SHORTNESS OF BREATH, Disp: 18 g, Rfl: 1   ARIPiprazole (ABILIFY) 20 MG tablet, Take 20 mg by mouth at bedtime as needed., Disp: , Rfl:    atorvastatin (LIPITOR) 20 MG tablet, Take 1 tablet (20 mg total) by mouth daily., Disp: 90 tablet, Rfl: 3   B Complex-C-Folic Acid (B COMPLEX-VITAMIN C-FOLIC ACID) 1 MG tablet, Take 1 tablet by mouth daily with breakfast., Disp: 90 tablet, Rfl: 1   benztropine (COGENTIN) 1 MG tablet, Take 1 mg by mouth 2 (two) times daily., Disp: , Rfl:    blood glucose meter kit and supplies KIT, Dispense based on patient and insurance preference. Use up to four times daily as directed. (FOR ICD-9 250.00, 250.01)., Disp: 1 each, Rfl: 0   Blood Glucose Monitoring Suppl DEVI, 1 each by Does not apply route 2 (two) times daily. May substitute to any manufacturer covered by patient's insurance. check sugars fasting and post-prandial  daily., Disp: 1 each, Rfl: 0   busPIRone (BUSPAR) 7.5 MG tablet, Take 7.5 mg by mouth 2 (two) times daily., Disp: , Rfl:    clotrimazole (LOTRIMIN) 1 % cream, APPLY TO AFFECTED AREA TWICE A DAY, Disp: 30 g, Rfl: 0   Cyanocobalamin 1000 MCG CAPS, Take 1 tablet by mouth daily., Disp: , Rfl:    ferrous sulfate 325 (65 FE) MG tablet, Take 325 mg by mouth in the morning and at bedtime., Disp: , Rfl:    FLUoxetine (PROZAC) 40 MG capsule, Take 40 mg by mouth daily., Disp: , Rfl:    gabapentin (NEURONTIN) 300 MG capsule, Take 1 capsule (300 mg total) by mouth 3 (three) times daily., Disp: 90 capsule, Rfl: 1   GOODSENSE ALLERGY RELIEF 10 MG tablet, TAKE 1 Tablet BY MOUTH ONCE DAILY, Disp: 90 tablet, Rfl: 0   guaiFENesin (MUCINEX) 600 MG 12 hr tablet, Take by mouth 2 (two) times daily., Disp: , Rfl:    haloperidol (HALDOL) 2 MG tablet, Take 2 mg by mouth daily., Disp: , Rfl:    hydrochlorothiazide (HYDRODIURIL) 12.5 MG tablet, Take 1 tablet (12.5 mg total) by mouth daily., Disp: 90 tablet, Rfl: 3   hydrOXYzine (VISTARIL) 25 MG capsule, Take 25 mg by mouth every evening. Every evening as needed, Disp: , Rfl:    linaclotide (LINZESS) 72 MCG capsule, Take 1 capsule (72 mcg total) by mouth daily before breakfast., Disp: 90 capsule, Rfl: 0   meclizine (ANTIVERT) 12.5  MG tablet, Take 1 tablet (12.5 mg total) by mouth 3 (three) times daily as needed for dizziness., Disp: 20 tablet, Rfl: 0   meloxicam (MOBIC) 15 MG tablet, Take 1 tablet (15 mg total) by mouth daily., Disp: 14 tablet, Rfl: 0   metFORMIN (GLUCOPHAGE) 500 MG tablet, Take 1 tablet (500 mg total) by mouth 2 (two) times daily with a meal., Disp: 180 tablet, Rfl: 0   metoprolol tartrate (LOPRESSOR) 25 MG tablet, TAKE ONE-HALF TABLET BY MOUTH TWICE DAILY AS NEEDED, Disp: 90 tablet, Rfl: 3   nystatin (MYCOSTATIN/NYSTOP) powder, Apply 1 Application topically 3 (three) times daily., Disp: 15 g, Rfl: 11   OLANZapine (ZYPREXA) 20 MG tablet, Take 20 mg by mouth  at bedtime., Disp: , Rfl:    Prenatal Vit-Fe Fum-FA-Omega (PNV PRENATAL PLUS MULTIVIT+DHA) 27-1 & 312 MG MISC, 1 po qd, Disp: 90 each, Rfl: 1   Probiotic Product (ACIDOPHILUS PROBIOTIC BLEND PO), Take by mouth., Disp: , Rfl:    Semaglutide, 1 MG/DOSE, 4 MG/3ML SOPN, Inject 1 mg as directed once a week., Disp: 3 mL, Rfl: 0   traZODone (DESYREL) 50 MG tablet, Take 50 mg by mouth at bedtime as needed for sleep., Disp: , Rfl:    valsartan (DIOVAN) 80 MG tablet, Take 1 tablet (80 mg total) by mouth at bedtime., Disp: 90 tablet, Rfl: 3   Vitamin D, Ergocalciferol, (DRISDOL) 1.25 MG (50000 UNIT) CAPS capsule, 1 tab q wed and 1 tab q sun, Disp: 24 capsule, Rfl: 0   Medications ordered in this encounter:  Meds ordered this encounter  Medications   cephALEXin (KEFLEX) 500 MG capsule    Sig: Take 1 capsule (500 mg total) by mouth 2 (two) times daily for 7 days.    Dispense:  14 capsule    Refill:  0    Supervising Provider:   Merrilee Jansky [[id number]]     *If you need refills on other medications prior to your next appointment, please contact your pharmacy*  Follow-Up: Call back or seek an in-person evaluation if the symptoms worsen or if the condition fails to improve as anticipated.  [facility] [phone]  Other Instructions  -increase fluids -complete medication as discussed     If you have been instructed to have an in-person evaluation today at a local Urgent Care facility, please use the link below. It will take you to a list of all of our available [facility], including address, phone number and hours of operation. Please do not delay care.  [facility]  If you or a family member do not have a primary care provider, use the link below to schedule a visit and establish care. When you choose a [place] primary care physician or advanced practice provider, you gain a long-term partner in health. Find a Primary Care Provider  Learn more  about [facility]'s in-office and virtual care options: [place] - Get Care Now

## 2023-07-26 NOTE — Progress Notes (Signed)
Virtual Visit Consent   Karen Brennan, you are scheduled for a virtual visit with a [facility] provider today. Just as with appointments in the office, your consent must be obtained to participate. Your consent will be active for this visit and any virtual visit you may have with one of our providers in the next 365 days. If you have a MyChart account, a copy of this consent can be sent to you electronically.  As this is a virtual visit, video technology does not allow for your provider to perform a traditional examination. This may limit your provider's ability to fully assess your condition. If your provider identifies any concerns that need to be evaluated in person or the need to arrange testing (such as labs, EKG, etc.), we will make arrangements to do so. Although advances in technology are sophisticated, we cannot ensure that it will always work on either your end or our end. If the connection with a video visit is poor, the visit may have to be switched to a telephone visit. With either a video or telephone visit, we are not always able to ensure that we have a secure connection.  By engaging in this virtual visit, you consent to the provision of healthcare and authorize for your insurance to be billed (if applicable) for the services provided during this visit. Depending on your insurance coverage, you may receive a charge related to this service.  I need to obtain your verbal consent now. Are you willing to proceed with your visit today? Karen Brennan has provided verbal consent on 07/26/2023 for a virtual visit (video or telephone). Freddy Finner, NP  Date: 07/26/2023 9:32 AM   Virtual Visit via Video Note   I, Freddy Finner, connected with  Karen Brennan  ([id number], [birth year]) on 07/26/23 at  9:30 AM EST by a video-enabled telemedicine application and verified that I am speaking with the correct person using two identifiers.  Location: Patient: Virtual Visit Location Patient:  Home Provider: Virtual Visit Location Provider: Home Office   I discussed the limitations of evaluation and management by telemedicine and the availability of in person appointments. The patient expressed understanding and agreed to proceed.    History of Present Illness: Karen Brennan is a 48 y.o. who identifies as a female who was assigned female at birth, and is being seen today for UTI  Onset was 1 week- back discomfort, lower back  Associated symptoms are urine is darker with odor, increased need to pee, urgency  Modifying factors are  increased fluids Denies chest pain, shortness of breath, fevers, chills, n/v   Problems:  Patient Active Problem List   Diagnosis Date Noted   Open wound of foot, right, initial encounter 06/25/2023   Plantar fasciitis 06/25/2023   BMI 45.0-49.9, adult (HCC)-current bmi 46.8 07/12/2022   Chronic constipation 06/14/2022   Seasonal allergies 05/01/2022   Hyperlipidemia associated with type 2 diabetes mellitus (HCC) 12/05/2021   Type II diabetes mellitus (HCC) 08/29/2021   PVC's (premature ventricular contractions) 07/19/2021   Intertrigo 02/16/2020   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   Neuropathic pain of both feet 05/09/2016   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   Anxiety 10/25/2011   Urinary tract infection with hematuria 10/12/2011   Bipolar 1 disorder, mixed (HCC) 09/15/2011   Allergic rhinitis 08/31/2008   Esophageal reflux 01/24/2008   Hypertension associated with diabetes (HCC) 08/17/2006    Allergies:  Allergies  Allergen Reactions  Sibutramine Hcl Monohydrate Hives   Medications:  Current Outpatient Medications:    Accu-Chek Softclix Lancets lancets, USE TO test fasting blood sugar EVERY DAY IN THE MORNING, Disp: 100 each, Rfl: 6   albuterol (VENTOLIN HFA) 108 (90 Base) MCG/ACT inhaler, inhale 2 puffs BY MOUTH EVERY 4 HOURS AS NEEDED FOR wheezing OR SHORTNESS OF BREATH, Disp: 18 g, Rfl:  1   ARIPiprazole (ABILIFY) 20 MG tablet, Take 20 mg by mouth at bedtime as needed., Disp: , Rfl:    atorvastatin (LIPITOR) 20 MG tablet, Take 1 tablet (20 mg total) by mouth daily., Disp: 90 tablet, Rfl: 3   B Complex-C-Folic Acid (B COMPLEX-VITAMIN C-FOLIC ACID) 1 MG tablet, Take 1 tablet by mouth daily with breakfast., Disp: 90 tablet, Rfl: 1   benztropine (COGENTIN) 1 MG tablet, Take 1 mg by mouth 2 (two) times daily., Disp: , Rfl:    blood glucose meter kit and supplies KIT, Dispense based on patient and insurance preference. Use up to four times daily as directed. (FOR ICD-9 250.00, 250.01)., Disp: 1 each, Rfl: 0   Blood Glucose Monitoring Suppl DEVI, 1 each by Does not apply route 2 (two) times daily. May substitute to any manufacturer covered by patient's insurance. check sugars fasting and post-prandial daily., Disp: 1 each, Rfl: 0   busPIRone (BUSPAR) 7.5 MG tablet, Take 7.5 mg by mouth 2 (two) times daily., Disp: , Rfl:    clotrimazole (LOTRIMIN) 1 % cream, APPLY TO AFFECTED AREA TWICE A DAY, Disp: 30 g, Rfl: 0   Cyanocobalamin 1000 MCG CAPS, Take 1 tablet by mouth daily., Disp: , Rfl:    ferrous sulfate 325 (65 FE) MG tablet, Take 325 mg by mouth in the morning and at bedtime., Disp: , Rfl:    FLUoxetine (PROZAC) 40 MG capsule, Take 40 mg by mouth daily., Disp: , Rfl:    gabapentin (NEURONTIN) 300 MG capsule, Take 1 capsule (300 mg total) by mouth 3 (three) times daily., Disp: 90 capsule, Rfl: 1   GOODSENSE ALLERGY RELIEF 10 MG tablet, TAKE 1 Tablet BY MOUTH ONCE DAILY, Disp: 90 tablet, Rfl: 0   guaiFENesin (MUCINEX) 600 MG 12 hr tablet, Take by mouth 2 (two) times daily., Disp: , Rfl:    haloperidol (HALDOL) 2 MG tablet, Take 2 mg by mouth daily., Disp: , Rfl:    hydrochlorothiazide (HYDRODIURIL) 12.5 MG tablet, Take 1 tablet (12.5 mg total) by mouth daily., Disp: 90 tablet, Rfl: 3   hydrOXYzine (VISTARIL) 25 MG capsule, Take 25 mg by mouth every evening. Every evening as needed, Disp: ,  Rfl:    linaclotide (LINZESS) 72 MCG capsule, Take 1 capsule (72 mcg total) by mouth daily before breakfast., Disp: 90 capsule, Rfl: 0   meclizine (ANTIVERT) 12.5 MG tablet, Take 1 tablet (12.5 mg total) by mouth 3 (three) times daily as needed for dizziness., Disp: 20 tablet, Rfl: 0   meloxicam (MOBIC) 15 MG tablet, Take 1 tablet (15 mg total) by mouth daily., Disp: 14 tablet, Rfl: 0   metFORMIN (GLUCOPHAGE) 500 MG tablet, Take 1 tablet (500 mg total) by mouth 2 (two) times daily with a meal., Disp: 180 tablet, Rfl: 0   metoprolol tartrate (LOPRESSOR) 25 MG tablet, TAKE ONE-HALF TABLET BY MOUTH TWICE DAILY AS NEEDED, Disp: 90 tablet, Rfl: 3   nystatin (MYCOSTATIN/NYSTOP) powder, Apply 1 Application topically 3 (three) times daily., Disp: 15 g, Rfl: 11   OLANZapine (ZYPREXA) 20 MG tablet, Take 20 mg by mouth at bedtime., Disp: , Rfl:  Prenatal Vit-Fe Fum-FA-Omega (PNV PRENATAL PLUS MULTIVIT+DHA) 27-1 & 312 MG MISC, 1 po qd, Disp: 90 each, Rfl: 1   Probiotic Product (ACIDOPHILUS PROBIOTIC BLEND PO), Take by mouth., Disp: , Rfl:    Semaglutide, 1 MG/DOSE, 4 MG/3ML SOPN, Inject 1 mg as directed once a week., Disp: 3 mL, Rfl: 0   traZODone (DESYREL) 50 MG tablet, Take 50 mg by mouth at bedtime as needed for sleep., Disp: , Rfl:    valsartan (DIOVAN) 80 MG tablet, Take 1 tablet (80 mg total) by mouth at bedtime., Disp: 90 tablet, Rfl: 3   Vitamin D, Ergocalciferol, (DRISDOL) 1.25 MG (50000 UNIT) CAPS capsule, 1 tab q wed and 1 tab q sun, Disp: 24 capsule, Rfl: 0  Observations/Objective: Patient is well-developed, well-nourished in no acute distress.  Resting comfortably  at home.  Head is normocephalic, atraumatic.  No labored breathing.  Speech is clear and coherent with logical content.  Patient is alert and oriented at baseline.    Assessment and Plan:  1. Suspected UTI (Primary)   - cephALEXin (KEFLEX) 500 MG capsule; Take 1 capsule (500 mg total) by mouth 2 (two) times daily for 7  days.  Dispense: 14 capsule; Refill: 0  -no other red flags for stone or kidney infection -increase fluids -complete medication as discussed -prevention discussed and on AVS   Reviewed side effects, risks and benefits of medication.    Patient acknowledged agreement and understanding of the plan.   Past Medical, Surgical, Social History, Allergies, and Medications have been Reviewed.     Follow Up Instructions: I discussed the assessment and treatment plan with the patient. The patient was provided an opportunity to ask questions and all were answered. The patient agreed with the plan and demonstrated an understanding of the instructions.  A copy of instructions were sent to the patient via MyChart unless otherwise noted below.    The patient was advised to call back or seek an in-person evaluation if the symptoms worsen or if the condition fails to improve as anticipated.    Freddy Finner, NP

## 2023-07-27 DIAGNOSIS — G5793 Unspecified mononeuropathy of bilateral lower limbs: Secondary | ICD-10-CM | POA: Diagnosis not present

## 2023-07-30 DIAGNOSIS — G5793 Unspecified mononeuropathy of bilateral lower limbs: Secondary | ICD-10-CM | POA: Diagnosis not present

## 2023-08-01 DIAGNOSIS — G5793 Unspecified mononeuropathy of bilateral lower limbs: Secondary | ICD-10-CM | POA: Diagnosis not present

## 2023-08-03 DIAGNOSIS — G5793 Unspecified mononeuropathy of bilateral lower limbs: Secondary | ICD-10-CM | POA: Diagnosis not present

## 2023-08-06 ENCOUNTER — Other Ambulatory Visit: Payer: Self-pay | Admitting: Family Medicine

## 2023-08-06 DIAGNOSIS — F411 Generalized anxiety disorder: Secondary | ICD-10-CM | POA: Diagnosis not present

## 2023-08-06 DIAGNOSIS — F5101 Primary insomnia: Secondary | ICD-10-CM | POA: Diagnosis not present

## 2023-08-06 DIAGNOSIS — F250 Schizoaffective disorder, bipolar type: Secondary | ICD-10-CM | POA: Diagnosis not present

## 2023-08-06 DIAGNOSIS — G5793 Unspecified mononeuropathy of bilateral lower limbs: Secondary | ICD-10-CM | POA: Diagnosis not present

## 2023-08-06 DIAGNOSIS — F4310 Post-traumatic stress disorder, unspecified: Secondary | ICD-10-CM | POA: Diagnosis not present

## 2023-08-06 DIAGNOSIS — L304 Erythema intertrigo: Secondary | ICD-10-CM

## 2023-08-08 ENCOUNTER — Other Ambulatory Visit: Payer: Self-pay | Admitting: Student

## 2023-08-08 ENCOUNTER — Ambulatory Visit (INDEPENDENT_AMBULATORY_CARE_PROVIDER_SITE_OTHER): Payer: Medicaid Other | Admitting: Family Medicine

## 2023-08-08 ENCOUNTER — Encounter (INDEPENDENT_AMBULATORY_CARE_PROVIDER_SITE_OTHER): Payer: Self-pay | Admitting: Family Medicine

## 2023-08-08 VITALS — BP 130/82 | HR 94 | Temp 98.3°F | Ht 73.0 in | Wt 320.0 lb

## 2023-08-08 DIAGNOSIS — Z9884 Bariatric surgery status: Secondary | ICD-10-CM | POA: Diagnosis not present

## 2023-08-08 DIAGNOSIS — E1169 Type 2 diabetes mellitus with other specified complication: Secondary | ICD-10-CM | POA: Diagnosis not present

## 2023-08-08 DIAGNOSIS — Z7985 Long-term (current) use of injectable non-insulin antidiabetic drugs: Secondary | ICD-10-CM

## 2023-08-08 DIAGNOSIS — E559 Vitamin D deficiency, unspecified: Secondary | ICD-10-CM

## 2023-08-08 DIAGNOSIS — Z7984 Long term (current) use of oral hypoglycemic drugs: Secondary | ICD-10-CM | POA: Diagnosis not present

## 2023-08-08 DIAGNOSIS — D508 Other iron deficiency anemias: Secondary | ICD-10-CM | POA: Diagnosis not present

## 2023-08-08 DIAGNOSIS — Z6841 Body Mass Index (BMI) 40.0 and over, adult: Secondary | ICD-10-CM

## 2023-08-08 DIAGNOSIS — E538 Deficiency of other specified B group vitamins: Secondary | ICD-10-CM

## 2023-08-08 DIAGNOSIS — K5909 Other constipation: Secondary | ICD-10-CM

## 2023-08-08 DIAGNOSIS — G5793 Unspecified mononeuropathy of bilateral lower limbs: Secondary | ICD-10-CM | POA: Diagnosis not present

## 2023-08-08 DIAGNOSIS — E669 Obesity, unspecified: Secondary | ICD-10-CM | POA: Diagnosis not present

## 2023-08-08 DIAGNOSIS — I10 Essential (primary) hypertension: Secondary | ICD-10-CM

## 2023-08-08 MED ORDER — LINACLOTIDE 72 MCG PO CAPS: 72.0000 ug | ORAL_CAPSULE | Freq: Every day | ORAL | 0 refills | Status: DC

## 2023-08-08 MED ORDER — PNV PRENATAL PLUS MULTIVIT+DHA 27-1 & 312 MG PO MISC: ORAL | 0 refills | Status: DC

## 2023-08-08 MED ORDER — SEMAGLUTIDE (1 MG/DOSE) 4 MG/3ML ~~LOC~~ SOPN: 1.0000 mg | PEN_INJECTOR | SUBCUTANEOUS | 0 refills | Status: DC

## 2023-08-08 MED ORDER — METFORMIN HCL 500 MG PO TABS: 500.0000 mg | ORAL_TABLET | Freq: Two times a day (BID) | ORAL | 0 refills | Status: DC

## 2023-08-08 MED ORDER — VITAMIN D (ERGOCALCIFEROL) 1.25 MG (50000 UNIT) PO CAPS: ORAL_CAPSULE | ORAL | 0 refills | Status: DC

## 2023-08-08 NOTE — Progress Notes (Signed)
 Karen Brennan, D.O.  ABFM, ABOM Specializing in Clinical Bariatric Medicine  Office located at: [address] [place], [place]  [address]   Assessment and Plan:  Labs obtained today ( A1C, B12, Vit D, Vit B6, B1, Folate, Iron, ferritin, and transferrin) to be reviewed at her next OV.   FOR THE DISEASE OF OBESITY: BMI 40.0-44.9, adult (HCC)- current BMI 42.23 Morbid obesity (HCC)-starting bmi 58.45/date 02/01/21 Assessment & Plan: Data from LOV does not appear to be accurate -- compared todays data to 05/09/2023. Since last office visit on 07/18/2023, patient's  muscle mass has increased by 1.6 lbs. Fat mass has increased by 12 lbs. Total body water has increased by 7.6 lbs.  Counseling done on how various foods will affect these numbers and how to maximize success   Total lbs lost to date: 123 lbs Total weight loss percentage to date: 27.77%    Recommended Dietary Goals Karen Brennan is currently in the action stage of change. As such, her goal is to continue weight management plan.  She has agreed to: continue current plan   Behavioral Intervention We discussed the following today: increasing lean protein intake to established goals, increasing water intake , and work on meal planning and preparation  Additional resources provided today: None  Evidence-based interventions for health behavior change were utilized today including the discussion of self monitoring techniques, problem-solving barriers and SMART goal setting techniques.   Regarding patient's less desirable eating habits and patterns, we employed the technique of small changes.   Pt will specifically work on: n/a   Recommended Physical Activity Goals Karen Brennan has been advised to work up to 150 minutes of moderate intensity aerobic activity a week and strengthening exercises 2-3 times per week for cardiovascular health, weight loss maintenance and preservation of muscle mass.   She has agreed to : Go to  [facility]   Pharmacotherapy We both agreed to : continue with nutritional and behavioral strategies and continue current medication regimen   FOR ASSOCIATED CONDITIONS ADDRESSED TODAY:  Type 2 diabetes mellitus with obesity (HCC) Assessment & Plan: Karen Brennan is treating T2DM with Semaglutide 1 mg weekly and Metformin 500 mg twice daily. She is managing medications well. Pt reports nausea whenever eating off plan. Karen Brennan also reports being part of a study at [facility]. During this study she had her cholesterol checked. Asked pt to send results or bring them in next OV. Reminded her to continuously eat on plan. Gave pt 90 day supply of meds on 2/5, pt states pharmacy is only giving her 30 day supply and she requests refill. Will recheck A1c today.  Relevant Orders: -     metFORMIN HCl; Take 1 tablet (500 mg total) by mouth 2 (two) times daily with a meal.  Dispense: 60 tablet; Refill: 0 -     Semaglutide (1 MG/DOSE); Inject 1 mg as directed once a week.  Dispense: 3 mL; Refill: 0 -     Hemoglobin A1c   S/P gastric bypass Assessment & Plan: Karen Brennan had gastric bypass surgery done 11/13/2011. Pt reports having a difficult time to finish larger meals. She also admits to occasionally using a protein shake as a supplement, but hasn't been doing so recently. Education done on eating multiple small meals throughout the day. Informed pt it is okay to have one protein shake daily as a supplement. Will check additional labs to rule out deficiencies d/t gastric bypass.  Relevant Orders: S/P gastric bypass -     Vitamin  B1 -     Folate -     Vitamin B6   B12 deficiency Assessment & Plan: Lab Results  Component Value Date   VITAMINB12 1,017 08/08/2023  Karen Brennan is taking Cyanocobalamin 1000 mcg daily for this condition. B12 levels were not at goal 3 months ago at 325. Continue current supplementation. Will recheck levels today to observe progress.  Relevant Orders: -     Vitamin B12   Vitamin D  deficiency Assessment & Plan: Pt is taking ERGO 50000 1 tab every wed and 1 tab every sun. Per last obtained labs, Vit D levels were 34.9 which is below the goal of 50-70. Will refill supplement today. Will recheck levels to observe progress.   Relevant Orders: -     Vitamin D (Ergocalciferol); 1 tab q wed and 1 tab q sun  Dispense: 8 capsule; Refill: 0 -     VITAMIN D 25 Hydroxy (Vit-D Deficiency, Fractures)   Other constipation Assessment & Plan: Karen Brennan uses Linzess 72 mcg as needed for this diagnosis. No concerns today in this regard. Will refill medication today.  Relevant Orders: -     linaCLOtide; Take 1 capsule (72 mcg total) by mouth daily before breakfast.  Dispense: 30 capsule; Refill: 0   Other iron deficiency anemia Assessment & Plan: Pt is currently taking prenatal vitamins for her iron deficiency anemia. No concerns with this supplementation. Recheck levels today. Will continue to monitor closely.  Relevant Orders: -     PNV Prenatal Plus Multivit+DHA; 1 po qd  Dispense: 30 each; Refill: 0 -     Iron and TIBC -     Ferritin -     Transferrin  Follow up:   Return in about 19 days (around 08/27/2023). She was informed of the importance of frequent follow up visits to maximize her success with intensive lifestyle modifications for her multiple health conditions.  Subjective:   Chief complaint: Obesity Karen Brennan is here to discuss her progress with her obesity treatment plan. She is on the the Category 3 Plan and states she is following her eating plan approximately 60% of the time. She states she is walking 20 minutes 3 days per week.  Interval History:  Karen Brennan is here for a follow up office visit. Since last OV on 07/18/2023, Karen Brennan is down 3 lbs. She reports eating more protein but still not enough. Pt has been snacking on fruits and vegetables. Additionally, pt admits she has not been going to [facility].   Pharmacotherapy for weight loss: She is currently taking  Semaglutide 1 mg once weekly.   Review of Systems:  Pertinent positives were addressed with patient today.  Reviewed by clinician on day of visit: allergies, medications, problem list, medical history, surgical history, family history, social history, and previous encounter notes.  Weight Summary and Biometrics   Weight Lost Since Last Visit: 3 lb  Weight Gained Since Last Visit: 0 lb    Vitals Temp: 98.3 F (36.8 C) BP: 130/82 Pulse Rate: 94 SpO2: 100 %   Anthropometric Measurements Height: 6\' 1"  (1.854 m) Weight: (!) 320 lb (145.2 kg) BMI (Calculated): 42.23 Weight at Last Visit: 323 lb Weight Lost Since Last Visit: 3 lb Weight Gained Since Last Visit: 0 lb Starting Weight: 443 lb Total Weight Loss (lbs): 123 lb (55.8 kg) Peak Weight: 505 lb   Body Composition  Body Fat %: 52.2 % Fat Mass (lbs): 167.2 lbs Muscle Mass (lbs): 145.4 lbs Total Body Water (lbs): 102.2 lbs  Visceral Fat Rating : 16   Other Clinical Data Fasting: no Labs: yes Today's Visit #: 37 Starting Date: 02/01/21    Objective:   PHYSICAL EXAM: Blood pressure 130/82, pulse 94, temperature 98.3 F (36.8 C), height 6\' 1"  (1.854 m), weight (!) 320 lb (145.2 kg), SpO2 100%. Body mass index is 42.22 kg/m.  General: she is overweight, cooperative and in no acute distress. PSYCH: Has normal mood, affect and thought process.   HEENT: EOMI, sclerae are anicteric. Lungs: Normal breathing effort, no conversational dyspnea. Extremities: Moves * 4 Neurologic: A and O * 3, good insight  DIAGNOSTIC DATA REVIEWED: BMET    Component Value Date/Time   NA 142 07/06/2023 0954   K 4.6 07/06/2023 0954   CL 101 07/06/2023 0954   CO2 27 07/06/2023 0954   GLUCOSE 83 07/06/2023 0954   GLUCOSE 208 (H) 07/04/2022 1037   BUN 13 07/06/2023 0954   CREATININE 0.75 07/06/2023 0954   CREATININE 0.82 07/05/2016 0942   CALCIUM 9.5 07/06/2023 0954   GFRNONAA >60 07/04/2022 1037   GFRNONAA >89 07/05/2016 0942    GFRAA 99 02/12/2020 1423   GFRAA >89 07/05/2016 0942   Lab Results  Component Value Date   HGBA1C 5.5 04/11/2023   HGBA1C 5.7 09/15/2011   Lab Results  Component Value Date   INSULIN 11.0 01/12/2022   INSULIN 22.7 02/01/2021   Lab Results  Component Value Date   TSH 1.150 04/11/2023   CBC    Component Value Date/Time   WBC 8.7 07/24/2023 1112   WBC 10.4 07/04/2022 1037   RBC 4.54 07/24/2023 1112   RBC 4.76 07/04/2022 1037   HGB 13.3 07/24/2023 1112   HCT 40.6 07/24/2023 1112   PLT 357 07/24/2023 1112   MCV 89 07/24/2023 1112   MCH 29.3 07/24/2023 1112   MCH 29.6 07/04/2022 1037   MCHC 32.8 07/24/2023 1112   MCHC 32.5 07/04/2022 1037   RDW 11.8 07/24/2023 1112   Iron Studies    Component Value Date/Time   IRON 30 06/20/2021 1205   TIBC 444 06/20/2021 1205   FERRITIN 8 (L) 06/20/2021 1205   IRONPCTSAT 7 (LL) 06/20/2021 1205   Lipid Panel     Component Value Date/Time   CHOL 126 02/16/2023 1605   TRIG 62 02/16/2023 1605   HDL 57 02/16/2023 1605   CHOLHDL 2.2 02/16/2023 1605   CHOLHDL 3.8 12/09/2014 1102   VLDL 25 12/09/2014 1102   LDLCALC 56 02/16/2023 1605   Hepatic Function Panel     Component Value Date/Time   PROT 6.3 04/11/2023 1050   ALBUMIN 4.1 04/11/2023 1050   AST 20 04/11/2023 1050   ALT 16 04/11/2023 1050   ALKPHOS 56 04/11/2023 1050   BILITOT 1.1 04/11/2023 1050   BILIDIR 0.1 02/02/2009 2254   IBILI 0.6 02/02/2009 2254      Component Value Date/Time   TSH 1.150 04/11/2023 1050   Nutritional Lab Results  Component Value Date   VD25OH 34.9 04/11/2023   VD25OH 45.6 10/02/2022   VD25OH 46.1 01/12/2022    Attestations:   I, Camryn Mix, acting as a Stage manager for [employer], DO., have compiled all relevant documentation for today's office visit on behalf of Thomasene Lot, DO, while in the presence of [employer], DO.  Reviewed by clinician on day of visit: allergies, medications, problem list, medical history,  surgical history, family history, social history, and previous encounter notes pertinent to patient's obesity diagnosis. I have spent 48 minutes in  the care of the patient today including: preparing to see patient (e.g. review and interpretation of tests, old notes ), obtaining and/or reviewing separately obtained history, performing a medically appropriate examination or evaluation, counseling and educating the patient, ordering medications, test or procedures, documenting clinical information in the electronic or other health care record, and independently interpreting results and communicating results to the patient, family, or caregiver   I have reviewed the above documentation for accuracy and completeness, and I agree with the above. Karen Brennan, D.O.  The 21st Century Cures Act was signed into law in 2016 which includes the topic of electronic health records.  This provides immediate access to information in MyChart.  This includes consultation notes, operative notes, office notes, lab results and pathology reports.  If you have any questions about what you read please let us know at your next visit so we can discuss your concerns and take corrective action if need be.  We are right here with you.

## 2023-08-10 DIAGNOSIS — G5793 Unspecified mononeuropathy of bilateral lower limbs: Secondary | ICD-10-CM | POA: Diagnosis not present

## 2023-08-13 DIAGNOSIS — G5793 Unspecified mononeuropathy of bilateral lower limbs: Secondary | ICD-10-CM | POA: Diagnosis not present

## 2023-08-13 LAB — IRON AND TIBC
Iron Saturation: 25 % (ref 15–55)
Iron: 101 ug/dL (ref 27–159)
Total Iron Binding Capacity: 407 ug/dL (ref 250–450)
UIBC: 306 ug/dL (ref 131–425)

## 2023-08-13 LAB — HEMOGLOBIN A1C
Est. average glucose Bld gHb Est-mCnc: 111 mg/dL
Hgb A1c MFr Bld: 5.5 % (ref 4.8–5.6)

## 2023-08-13 LAB — FERRITIN: Ferritin: 52 ng/mL (ref 15–150)

## 2023-08-13 LAB — VITAMIN B1: Thiamine: 111.5 nmol/L (ref 66.5–200.0)

## 2023-08-13 LAB — TRANSFERRIN: Transferrin: 309 mg/dL (ref 192–364)

## 2023-08-13 LAB — FOLATE: Folate: >20 ng/mL (ref >3.0)

## 2023-08-13 LAB — VITAMIN B12: Vitamin B-12: 1017 pg/mL (ref 232–1245)

## 2023-08-13 LAB — VITAMIN D 25 HYDROXY (VIT D DEFICIENCY, FRACTURES): Vit D, 25-Hydroxy: 42.2 ng/mL (ref 30.0–100.0)

## 2023-08-15 DIAGNOSIS — G5793 Unspecified mononeuropathy of bilateral lower limbs: Secondary | ICD-10-CM | POA: Diagnosis not present

## 2023-08-17 DIAGNOSIS — G5793 Unspecified mononeuropathy of bilateral lower limbs: Secondary | ICD-10-CM | POA: Diagnosis not present

## 2023-08-20 ENCOUNTER — Ambulatory Visit: Payer: Medicaid Other | Admitting: Obstetrics & Gynecology

## 2023-08-20 ENCOUNTER — Other Ambulatory Visit (HOSPITAL_COMMUNITY)
Admission: RE | Admit: 2023-08-20 | Discharge: 2023-08-20 | Disposition: A | Discharge status: Home / Self Care | Source: Ambulatory Visit | Attending: Obstetrics & Gynecology | Admitting: Obstetrics & Gynecology

## 2023-08-20 ENCOUNTER — Telehealth: Payer: Self-pay | Admitting: *Deleted

## 2023-08-20 ENCOUNTER — Encounter: Payer: Self-pay | Admitting: Obstetrics & Gynecology

## 2023-08-20 VITALS — BP 114/80 | HR 91 | Ht 73.0 in | Wt 325.0 lb

## 2023-08-20 DIAGNOSIS — N871 Moderate cervical dysplasia: Secondary | ICD-10-CM | POA: Diagnosis not present

## 2023-08-20 DIAGNOSIS — Z3202 Encounter for pregnancy test, result negative: Secondary | ICD-10-CM | POA: Diagnosis not present

## 2023-08-20 DIAGNOSIS — D069 Carcinoma in situ of cervix, unspecified: Secondary | ICD-10-CM | POA: Insufficient documentation

## 2023-08-20 DIAGNOSIS — G5793 Unspecified mononeuropathy of bilateral lower limbs: Secondary | ICD-10-CM | POA: Diagnosis not present

## 2023-08-20 LAB — POCT URINE PREGNANCY: Preg Test, Ur: NEGATIVE

## 2023-08-20 MED ORDER — METRONIDAZOLE 500 MG PO TABS: 500.0000 mg | ORAL_TABLET | Freq: Two times a day (BID) | ORAL | 0 refills | Status: AC

## 2023-08-20 NOTE — Telephone Encounter (Signed)
 RTC to patient regarding vaginal itching following LEEP procedure today. No answer. Left HIPAA compliant VM with call back number.

## 2023-08-20 NOTE — Patient Instructions (Signed)
LEEP POST-PROCEDURE INSTRUCTIONS  You may take Ibuprofen, Aleve or Tylenol for pain if needed.  Cramping is normal.  You will have black and/or bloody discharge at first.  This will lighten and then turn clear before completely resolving.  This will take 2 to 3 weeks.  Put nothing in your vagina until the bleeding or discharge stops (usually 3 weeks).  You need to call if you have redness around the biopsy site, if there is any unusual draining, if the bleeding is heavy, or if you are concerned.  Shower or bathe as normal  We will call you within one week with results or we will discuss the results at your follow-up appointment if needed.  You will need to return for a follow-up Pap smear as directed by your physician. 

## 2023-08-20 NOTE — Addendum Note (Signed)
 Addended by: Marya Landry D on: 08/20/2023 09:25 AM   Modules accepted: Orders

## 2023-08-20 NOTE — Progress Notes (Signed)
   GYNECOLOGY OFFICE PROCEDURE NOTE  Karen Brennan is a 48 y.o. [id number] here for LEEP. No GYN concerns. Pap smear and colposcopy history reviewed.    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  Role of HPV,cervical dysplasia and need for close followup was empasized. Informed written consent was obtained. All questions were answered. Time out performed. Urine pregnancy test was negative.  ??Procedure: The patient was placed in lithotomy position and the bivalved coated speculum was placed in the patient's vagina. A grounding pad placed on the patient. Lugol's solution was applied to the cervix and areas of decreased uptake were noted around the transformation zone.   Local anesthesia was administered via an intracervical block using 10 ml of 2% Lidocaine with epinephrine. The suction was turned on and the Large Fisher Cone Biopsy Excisor on 60 Watts of blended current was used to excise the area of decreased uptake and excise the entire transformation zone. Also excised a smaller sample on the anterior lip above the bigger sample to get the remaining concerning dysplasia area.  Excellent hemostasis was achieved using roller ball coagulation set at 60 Watts coagulation current. Monsel's solution was then applied and the speculum was removed from the vagina. Specimens were sent to pathology.  ?The patient tolerated the procedure well. Post-operative instructions given to patient, including instruction to seek medical attention for persistent bright red bleeding, fever, abdominal/pelvic pain, dysuria, nausea or vomiting. She was also told about the possibility of having copious yellow to black  tinged discharge for weeks. She reported having BV after colposcopy, wanted Metronidazole prescribed in case this happens again and this was done.  She was counseled to avoid anything in the vagina (sex/douching/tampons) for 3 weeks. She has a 4 week post-operative check to assess wound healing, review results and discuss further management.    Jaynie Collins, MD, FACOG Obstetrician & Gynecologist, [facility] for [employer], [facility]

## 2023-08-22 DIAGNOSIS — G5793 Unspecified mononeuropathy of bilateral lower limbs: Secondary | ICD-10-CM | POA: Diagnosis not present

## 2023-08-22 LAB — SURGICAL PATHOLOGY

## 2023-08-23 ENCOUNTER — Encounter: Payer: Self-pay | Admitting: Obstetrics & Gynecology

## 2023-08-23 NOTE — Progress Notes (Signed)
 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 Please call to inform patient of results and recommendations.  Jaynie Collins, MD

## 2023-08-24 DIAGNOSIS — G5793 Unspecified mononeuropathy of bilateral lower limbs: Secondary | ICD-10-CM | POA: Diagnosis not present

## 2023-08-27 ENCOUNTER — Ambulatory Visit (INDEPENDENT_AMBULATORY_CARE_PROVIDER_SITE_OTHER): Payer: Medicaid Other | Admitting: Family Medicine

## 2023-08-27 ENCOUNTER — Encounter (INDEPENDENT_AMBULATORY_CARE_PROVIDER_SITE_OTHER): Payer: Self-pay | Admitting: Family Medicine

## 2023-08-27 VITALS — BP 122/78 | HR 102 | Temp 98.2°F | Ht 73.0 in | Wt 316.0 lb

## 2023-08-27 DIAGNOSIS — G5793 Unspecified mononeuropathy of bilateral lower limbs: Secondary | ICD-10-CM | POA: Diagnosis not present

## 2023-08-27 DIAGNOSIS — Z7984 Long term (current) use of oral hypoglycemic drugs: Secondary | ICD-10-CM

## 2023-08-27 DIAGNOSIS — Z7985 Long-term (current) use of injectable non-insulin antidiabetic drugs: Secondary | ICD-10-CM

## 2023-08-27 DIAGNOSIS — E559 Vitamin D deficiency, unspecified: Secondary | ICD-10-CM | POA: Diagnosis not present

## 2023-08-27 DIAGNOSIS — D508 Other iron deficiency anemias: Secondary | ICD-10-CM | POA: Diagnosis not present

## 2023-08-27 DIAGNOSIS — E538 Deficiency of other specified B group vitamins: Secondary | ICD-10-CM

## 2023-08-27 DIAGNOSIS — E1169 Type 2 diabetes mellitus with other specified complication: Secondary | ICD-10-CM

## 2023-08-27 DIAGNOSIS — Z6841 Body Mass Index (BMI) 40.0 and over, adult: Secondary | ICD-10-CM | POA: Diagnosis not present

## 2023-08-27 DIAGNOSIS — K5909 Other constipation: Secondary | ICD-10-CM | POA: Diagnosis not present

## 2023-08-27 MED ORDER — PNV PRENATAL PLUS MULTIVIT+DHA 27-1 & 312 MG PO MISC: ORAL | 0 refills | Status: DC

## 2023-08-27 MED ORDER — NEPHRO-VITE RX 1 MG PO TABS: 1.0000 | ORAL_TABLET | Freq: Every day | ORAL | 0 refills | Status: DC

## 2023-08-27 MED ORDER — METFORMIN HCL 500 MG PO TABS: 500.0000 mg | ORAL_TABLET | Freq: Two times a day (BID) | ORAL | 0 refills | Status: DC

## 2023-08-27 MED ORDER — SEMAGLUTIDE (1 MG/DOSE) 4 MG/3ML ~~LOC~~ SOPN: 1.0000 mg | PEN_INJECTOR | SUBCUTANEOUS | 0 refills | Status: DC

## 2023-08-27 MED ORDER — LINACLOTIDE 72 MCG PO CAPS: 72.0000 ug | ORAL_CAPSULE | Freq: Every day | ORAL | 0 refills | Status: DC

## 2023-08-27 MED ORDER — VITAMIN D (ERGOCALCIFEROL) 1.25 MG (50000 UNIT) PO CAPS: ORAL_CAPSULE | ORAL | 0 refills | Status: DC

## 2023-08-27 NOTE — Progress Notes (Signed)
 Karen Brennan, D.O.  ABFM, ABOM Specializing in Clinical Bariatric Medicine  Office located at: [address] [place], [place]  [address]   Assessment and Plan:   FOR THE DISEASE OF OBESITY: BMI 40.0-44.9, adult (HCC)- current BMI 41.7 Morbid obesity (HCC)-starting bmi 58.45/date 02/01/21 Assessment & Plan: Since last office visit on 08/08/2023, patient's muscle mass has decreased by 0.4 lbs. Fat mass has decreased by 3.2 lbs. Total body water has decreased by 0.6 lbs.  Counseling done on how various foods will affect these numbers and how to maximize success  Total lbs lost to date: 127 lbs Total weight loss percentage to date: 28.67%    Recommended Dietary Goals Karen Brennan is currently in the action stage of change. As such, her goal is to continue weight management plan.  She has agreed to: continue current plan   Behavioral Intervention We discussed the following today: continue to work on maintaining a reduced calorie state, getting the recommended amount of protein, incorporating whole foods, making healthy choices, staying well hydrated and practicing mindfulness when eating.  Additional resources provided today: None  Evidence-based interventions for health behavior change were utilized today including the discussion of self monitoring techniques, problem-solving barriers and SMART goal setting techniques.   Regarding patient's less desirable eating habits and patterns, we employed the technique of small changes.   Pt will specifically work on: n/a   Recommended Physical Activity Goals Karen Brennan has been advised to work up to 150 minutes of moderate intensity aerobic activity a week and strengthening exercises 2-3 times per week for cardiovascular health, weight loss maintenance and preservation of muscle mass.   She has agreed to :  Increase physical activity in their day and reduce sedentary time (increase NEAT).   Pharmacotherapy We both agreed to : Go to [facility]  2 days a week   FOR ASSOCIATED CONDITIONS ADDRESSED TODAY:  Type 2 diabetes mellitus with obesity [facility]) Assessment & Plan: Lab Results  Component Value Date   HGBA1C 5.5 08/08/2023   HGBA1C 5.5 04/11/2023   HGBA1C 5.5 10/02/2022   INSULIN 11.0 01/12/2022   INSULIN 22.7 02/01/2021  Relevant medications: Ozempic 1 mg once weekly and Metformin 500 mg twice daily Reviewed labs from LOV, pt A1c stable at 5.5. T2DM well managed. Hunger well controlled, pt reports occasional boredom cravings. Betrice usually resolves these cravings by eating fruit. Pt denies any adverse SE from medications. Continue decreasing simple carbs and increasing lean protein, as well as continuous exercise. Continue current medication regimen. Will monitor condition as it pertains to her weight loss journey.   Relevant Orders: -     metFORMIN HCl; Take 1 tablet (500 mg total) by mouth 2 (two) times daily with a meal.  Dispense: 60 tablet; Refill: 0 -     Semaglutide (1 MG/DOSE); Inject 1 mg as directed once a week.  Dispense: 3 mL; Refill: 0   Other constipation Assessment & Plan: Karen Brennan is managing her constipation with Linzess 72 mcg as needed. She reports this is working well to relieve her symptoms, and notices an increase when constipation when not taking. Continue taking medication. Will continue to monitor closely.   Relevant Orders: -     linaCLOtide; Take 1 capsule (72 mcg total) by mouth daily before breakfast.  Dispense: 30 capsule; Refill: 0   Other iron deficiency anemia Assessment & Plan: Lab Results  Component Value Date   IRON 101 08/08/2023   TIBC 407 08/08/2023   FERRITIN 52 08/08/2023  Karen Brennan in managing her iron deficiency anemia with prenatal vitamins. Last labs reviewed with pt showing all levels WNL. Continue taking supplement daily. Will monitor condition.   Relevant Orders: -     PNV Prenatal Plus Multivit+DHA; 1 po qd  Dispense: 30 each; Refill: 0   B12 deficiency Assessment &  Plan: Lab Results  Component Value Date   THIAMINE 111.5 08/08/2023   VITAMINB12 1,017 08/08/2023   FOLATE >20.0 08/08/2023  Pt takes B Complex for B12 deficiency. Per last obtained labs, all levels are WNL. No concerns today in this regard, continue current supplementation regimen. Will continue to monitor.   Relevant Orders: -     Nephro-Vite Rx; Take 1 tablet by mouth daily with breakfast.  Dispense: 30 tablet; Refill: 0    Vitamin D deficiency Assessment & Plan: Lab Results  Component Value Date   VD25OH 42.2 08/08/2023  Karen Brennan is taking ERGO 50,000 units twice weekly. She reports sometimes forgetting to take supplement. Reviewed last obtained labs with pt, Vit D levels were sub optimal at 42.2. Will not change dose today. Encouraged pt to take continuously take supplement as prescribed. Will recheck levels in 3 months to observe improvement.   Relevant Orders: -     Vitamin D (Ergocalciferol); 1 tab q wed and 1 tab q sun  Dispense: 8 capsule; Refill: 0  Follow up:   Return in about 23 days (around 09/19/2023). She was informed of the importance of frequent follow up visits to maximize her success with intensive lifestyle modifications for her multiple health conditions.  Subjective:   Chief complaint: Obesity Karen Brennan is here to discuss her progress with her obesity treatment plan. She is on the the Category 3 Plan and states she is following her eating plan approximately 50% of the time. She states she is walking 15 minutes 3 days per week.  Interval History:  Karen Brennan is here for a follow up office visit. Since last OV on 08/08/2023, Karen Brennan is up 4 lbs. Pt reports she has been struggling with vertigo, and also had to get two procedures done for an abnormal pap smear. She endorses feeling down emotionally d/t the recent medical events.  Pharmacotherapy for weight loss: She is currently taking Metformin 500 mg twice daily and Ozempic 1 mg once weekly.   Review of Systems:   Pertinent positives were addressed with patient today.  Reviewed by clinician on day of visit: allergies, medications, problem list, medical history, surgical history, family history, social history, and previous encounter notes.  Weight Summary and Biometrics   Weight Lost Since Last Visit: 4lb  Weight Gained Since Last Visit: 0   Vitals Temp: 98.2 F (36.8 C) BP: 122/78 Pulse Rate: (!) 102 SpO2: 99 %   Anthropometric Measurements Height: 6\' 1"  (1.854 m) Weight: (!) 316 lb (143.3 kg) BMI (Calculated): 41.7 Weight at Last Visit: 320lb Weight Lost Since Last Visit: 4lb Weight Gained Since Last Visit: 0 Starting Weight: 443lb Total Weight Loss (lbs): 127 lb (57.6 kg) Peak Weight: 505lb   Body Composition  Body Fat %: 51.8 % Fat Mass (lbs): 164 lbs Muscle Mass (lbs): 145 lbs Total Body Water (lbs): 101.6 lbs Visceral Fat Rating : 15   Other Clinical Data Fasting: no Labs: yes Today's Visit #: 38 Starting Date: 02/01/21    Objective:   PHYSICAL EXAM: Blood pressure 122/78, pulse (!) 102, temperature 98.2 F (36.8 C), height 6\' 1"  (1.854 m), weight (!) 316 lb (143.3 kg), SpO2 99%. Body mass  index is 41.69 kg/m.  General: she is overweight, cooperative and in no acute distress. PSYCH: Has normal mood, affect and thought process.   HEENT: EOMI, sclerae are anicteric. Lungs: Normal breathing effort, no conversational dyspnea. Extremities: Moves * 4 Neurologic: A and O * 3, good insight  DIAGNOSTIC DATA REVIEWED: BMET    Component Value Date/Time   NA 142 07/06/2023 0954   K 4.6 07/06/2023 0954   CL 101 07/06/2023 0954   CO2 27 07/06/2023 0954   GLUCOSE 83 07/06/2023 0954   GLUCOSE 208 (H) 07/04/2022 1037   BUN 13 07/06/2023 0954   CREATININE 0.75 07/06/2023 0954   CREATININE 0.82 07/05/2016 0942   CALCIUM 9.5 07/06/2023 0954   GFRNONAA >60 07/04/2022 1037   GFRNONAA >89 07/05/2016 0942   GFRAA 99 02/12/2020 1423   GFRAA >89 07/05/2016 0942    Lab Results  Component Value Date   HGBA1C 5.5 08/08/2023   HGBA1C 5.7 09/15/2011   Lab Results  Component Value Date   INSULIN 11.0 01/12/2022   INSULIN 22.7 02/01/2021   Lab Results  Component Value Date   TSH 1.150 04/11/2023   CBC    Component Value Date/Time   WBC 8.7 07/24/2023 1112   WBC 10.4 07/04/2022 1037   RBC 4.54 07/24/2023 1112   RBC 4.76 07/04/2022 1037   HGB 13.3 07/24/2023 1112   HCT 40.6 07/24/2023 1112   PLT 357 07/24/2023 1112   MCV 89 07/24/2023 1112   MCH 29.3 07/24/2023 1112   MCH 29.6 07/04/2022 1037   MCHC 32.8 07/24/2023 1112   MCHC 32.5 07/04/2022 1037   RDW 11.8 07/24/2023 1112   Iron Studies    Component Value Date/Time   IRON 101 08/08/2023 1029   TIBC 407 08/08/2023 1029   FERRITIN 52 08/08/2023 1029   IRONPCTSAT 25 08/08/2023 1029   Lipid Panel     Component Value Date/Time   CHOL 126 02/16/2023 1605   TRIG 62 02/16/2023 1605   HDL 57 02/16/2023 1605   CHOLHDL 2.2 02/16/2023 1605   CHOLHDL 3.8 12/09/2014 1102   VLDL 25 12/09/2014 1102   LDLCALC 56 02/16/2023 1605   Hepatic Function Panel     Component Value Date/Time   PROT 6.3 04/11/2023 1050   ALBUMIN 4.1 04/11/2023 1050   AST 20 04/11/2023 1050   ALT 16 04/11/2023 1050   ALKPHOS 56 04/11/2023 1050   BILITOT 1.1 04/11/2023 1050   BILIDIR 0.1 02/02/2009 2254   IBILI 0.6 02/02/2009 2254      Component Value Date/Time   TSH 1.150 04/11/2023 1050   Nutritional Lab Results  Component Value Date   VD25OH 42.2 08/08/2023   VD25OH 34.9 04/11/2023   VD25OH 45.6 10/02/2022    Attestations:   I, Camryn Mix, acting as a Stage manager for [employer], DO., have compiled all relevant documentation for today's office visit on behalf of Thomasene Lot, DO, while in the presence of [employer], DO.  I have reviewed the above documentation for accuracy and completeness, and I agree with the above. Karen Brennan, D.O.  The 21st Century Cures Act was  signed into law in 2016 which includes the topic of electronic health records.  This provides immediate access to information in MyChart.  This includes consultation notes, operative notes, office notes, lab results and pathology reports.  If you have any questions about what you read please let us know at your next visit so we can discuss your concerns and take corrective  action if need be.  We are right here with you.

## 2023-08-28 ENCOUNTER — Ambulatory Visit: Payer: Medicaid Other | Admitting: Family Medicine

## 2023-08-29 DIAGNOSIS — G5793 Unspecified mononeuropathy of bilateral lower limbs: Secondary | ICD-10-CM | POA: Diagnosis not present

## 2023-08-31 DIAGNOSIS — G5793 Unspecified mononeuropathy of bilateral lower limbs: Secondary | ICD-10-CM | POA: Diagnosis not present

## 2023-09-03 DIAGNOSIS — G5793 Unspecified mononeuropathy of bilateral lower limbs: Secondary | ICD-10-CM | POA: Diagnosis not present

## 2023-09-05 DIAGNOSIS — G5793 Unspecified mononeuropathy of bilateral lower limbs: Secondary | ICD-10-CM | POA: Diagnosis not present

## 2023-09-07 DIAGNOSIS — G5793 Unspecified mononeuropathy of bilateral lower limbs: Secondary | ICD-10-CM | POA: Diagnosis not present

## 2023-09-10 ENCOUNTER — Other Ambulatory Visit: Payer: Self-pay | Admitting: Student

## 2023-09-10 DIAGNOSIS — G5793 Unspecified mononeuropathy of bilateral lower limbs: Secondary | ICD-10-CM | POA: Diagnosis not present

## 2023-09-12 DIAGNOSIS — G5793 Unspecified mononeuropathy of bilateral lower limbs: Secondary | ICD-10-CM | POA: Diagnosis not present

## 2023-09-14 DIAGNOSIS — G5793 Unspecified mononeuropathy of bilateral lower limbs: Secondary | ICD-10-CM | POA: Diagnosis not present

## 2023-09-17 DIAGNOSIS — G5793 Unspecified mononeuropathy of bilateral lower limbs: Secondary | ICD-10-CM | POA: Diagnosis not present

## 2023-09-18 ENCOUNTER — Ambulatory Visit (INDEPENDENT_AMBULATORY_CARE_PROVIDER_SITE_OTHER): Admitting: Family Medicine

## 2023-09-18 ENCOUNTER — Encounter: Payer: Self-pay | Admitting: Family Medicine

## 2023-09-18 VITALS — BP 115/88 | Ht 73.0 in | Wt 320.0 lb

## 2023-09-18 DIAGNOSIS — M25372 Other instability, left ankle: Secondary | ICD-10-CM | POA: Diagnosis not present

## 2023-09-18 DIAGNOSIS — M19172 Post-traumatic osteoarthritis, left ankle and foot: Secondary | ICD-10-CM

## 2023-09-18 NOTE — Progress Notes (Signed)
 PCP: Lorayne Bender, MD  Chief Complaint: ankle f/u Subjective:   HPI: Patient is a 48 y.o. female here for left ankle pain.  Patient notes improvement in her ankle pain after the injection previously.  Patient states that she feels like she is about 30% better.  Patient also has been doing her exercises that were given to her.  Patient has also been wearing her knee brace.  Patient otherwise has no other concerns at this time and does note improvement.    Past Medical History:  Diagnosis Date   ANEMIA 08/14/2008   Qualifier: Diagnosis of  By: Karn Pickler MD, Jessica     Anxiety    Asthma    B12 deficiency 08/29/2021   Bipolar 1 disorder (HCC)    Blood transfusion    Cellulitis 07/15/2020   Of right buttock   Chest pain    Depression    Edema of both lower extremities    GERD (gastroesophageal reflux disease)    History of bronchitis    Hypertension    Kidney stones 10/09/2011   Migraines 10/09/2011   "often"   Morbid obesity with BMI of 50.0-59.9, adult (HCC) 08/09/2006   Qualifier: Diagnosis of   By: Karn Pickler MD, Jessica         Pre-diabetes    Schizophrenia (HCC)    SOB (shortness of breath)    Vitamin D deficiency 08/29/2021    Current Outpatient Medications on File Prior to Visit  Medication Sig Dispense Refill   Accu-Chek Softclix Lancets lancets USE TO test fasting blood sugar EVERY DAY IN THE MORNING 100 each 6   albuterol (VENTOLIN HFA) 108 (90 Base) MCG/ACT inhaler inhale 2 puffs BY MOUTH EVERY 4 HOURS AS NEEDED FOR wheezing OR SHORTNESS OF BREATH 18 g 1   ARIPiprazole (ABILIFY) 20 MG tablet Take 20 mg by mouth at bedtime as needed.     atorvastatin (LIPITOR) 20 MG tablet TAKE 1 Tablet BY MOUTH ONCE DAILY 90 tablet 3   B Complex-C-Folic Acid (B COMPLEX-VITAMIN C-FOLIC ACID) 1 MG tablet Take 1 tablet by mouth daily with breakfast. 30 tablet 0   benztropine (COGENTIN) 1 MG tablet Take 1 mg by mouth 2 (two) times daily.     blood glucose meter kit and supplies KIT Dispense  based on patient and insurance preference. Use up to four times daily as directed. (FOR ICD-9 250.00, 250.01). 1 each 0   Blood Glucose Monitoring Suppl DEVI 1 each by Does not apply route 2 (two) times daily. May substitute to any manufacturer covered by patient's insurance. check sugars fasting and post-prandial daily. 1 each 0   busPIRone (BUSPAR) 7.5 MG tablet Take 7.5 mg by mouth 2 (two) times daily.     clotrimazole (LOTRIMIN) 1 % cream APPLY TO AFFECTED AREA TWICE A DAY 30 g 0   Cyanocobalamin 1000 MCG CAPS Take 1 tablet by mouth daily.     ferrous sulfate 325 (65 FE) MG tablet Take 325 mg by mouth in the morning and at bedtime.     FLUoxetine (PROZAC) 40 MG capsule Take 40 mg by mouth daily.     gabapentin (NEURONTIN) 300 MG capsule Take 1 capsule (300 mg total) by mouth 3 (three) times daily. 90 capsule 1   GOODSENSE ALLERGY RELIEF 10 MG tablet TAKE 1 Tablet BY MOUTH ONCE DAILY 90 tablet 0   guaiFENesin (MUCINEX) 600 MG 12 hr tablet Take by mouth 2 (two) times daily.     haloperidol (HALDOL) 2 MG tablet Take  2 mg by mouth daily.     hydrochlorothiazide (HYDRODIURIL) 12.5 MG tablet TAKE 1 Tablet BY MOUTH ONCE DAILY 90 tablet 3   hydrOXYzine (VISTARIL) 25 MG capsule Take 25 mg by mouth every evening. Every evening as needed     linaclotide (LINZESS) 72 MCG capsule Take 1 capsule (72 mcg total) by mouth daily before breakfast. 30 capsule 0   meclizine (ANTIVERT) 12.5 MG tablet Take 1 tablet (12.5 mg total) by mouth 3 (three) times daily as needed for dizziness. 20 tablet 0   meloxicam (MOBIC) 15 MG tablet Take 1 tablet (15 mg total) by mouth daily. 14 tablet 0   metFORMIN (GLUCOPHAGE) 500 MG tablet Take 1 tablet (500 mg total) by mouth 2 (two) times daily with a meal. 60 tablet 0   metoprolol tartrate (LOPRESSOR) 25 MG tablet TAKE ONE-HALF TABLET BY MOUTH TWICE DAILY AS NEEDED 90 tablet 3   nystatin (MYCOSTATIN/NYSTOP) powder Apply 1 Application topically 3 (three) times daily. 15 g 11    OLANZapine (ZYPREXA) 20 MG tablet Take 20 mg by mouth at bedtime.     Prenatal Vit-Fe Fum-FA-Omega (PNV PRENATAL PLUS MULTIVIT+DHA) 27-1 & 312 MG MISC 1 po qd 30 each 0   Probiotic Product (ACIDOPHILUS PROBIOTIC BLEND PO) Take by mouth.     Semaglutide, 1 MG/DOSE, 4 MG/3ML SOPN Inject 1 mg as directed once a week. 3 mL 0   traZODone (DESYREL) 50 MG tablet Take 50 mg by mouth at bedtime as needed for sleep.     valsartan (DIOVAN) 80 MG tablet TAKE 1 Tablet BY MOUTH ONCE DAILY at bedtime 90 tablet 3   Vitamin D, Ergocalciferol, (DRISDOL) 1.25 MG (50000 UNIT) CAPS capsule 1 tab q wed and 1 tab q sun 8 capsule 0   No current facility-administered medications on file prior to visit.    Past Surgical History:  Procedure Laterality Date   ANKLE FRACTURE SURGERY   1990's   "left; put screws in"   ENDOMETRIAL ABLATION  ~ 2011   FRACTURE SURGERY     GASTRIC BY PASS     TUBAL LIGATION  2006    Allergies  Allergen Reactions   Sibutramine Hcl Monohydrate Hives    BP 115/88   Ht 6\' 1"  (1.854 m)   Wt (!) 320 lb (145.2 kg)   BMI 42.22 kg/m       No data to display              No data to display              Objective:  Physical Exam:  Gen: NAD, comfortable in exam room  Inspection reveals surgical scar of the left side and mild swelling compared to the right.  Continued mild tenderness over the ATFL area as well as the anterior tibial talar compartment.  No tenderness over the malleolus.  Range of motion is full with plantar and dorsiflexion though some pain still noted with both dorsi or plantarflexion.  Patient able ambulate without any difficulty.   Assessment & Plan:  1. 1. Instability of left ankle joint (Primary) - Ambulatory referral to Physical Therapy  2. Post-traumatic osteoarthritis of left ankle - pt noted improvement from injection. Will continue with rehab.  Patient would like to try aquatic therapy at [facility].  Will continue to do strengthening and  eventually goal is to have patient not have to wear ASO brace for daily wear. - Will have patient follow-up after physical therapy and reevaluate at that  time.  Can also consider shockwave therapy in the future though cost may be prohibiting factor. - Ambulatory referral to Physical Therapy    Brenton Grills MD, PGY-4  Sports Medicine Fellow [facility]

## 2023-09-19 ENCOUNTER — Encounter (INDEPENDENT_AMBULATORY_CARE_PROVIDER_SITE_OTHER): Payer: Self-pay

## 2023-09-19 ENCOUNTER — Encounter (INDEPENDENT_AMBULATORY_CARE_PROVIDER_SITE_OTHER): Payer: Self-pay | Admitting: Family Medicine

## 2023-09-19 ENCOUNTER — Ambulatory Visit (INDEPENDENT_AMBULATORY_CARE_PROVIDER_SITE_OTHER): Payer: Medicaid Other | Admitting: Family Medicine

## 2023-09-19 ENCOUNTER — Ambulatory Visit

## 2023-09-19 VITALS — BP 126/84 | HR 78 | Temp 98.7°F | Ht 73.0 in | Wt 320.8 lb

## 2023-09-19 DIAGNOSIS — Z6841 Body Mass Index (BMI) 40.0 and over, adult: Secondary | ICD-10-CM

## 2023-09-19 DIAGNOSIS — Z7984 Long term (current) use of oral hypoglycemic drugs: Secondary | ICD-10-CM | POA: Diagnosis not present

## 2023-09-19 DIAGNOSIS — D508 Other iron deficiency anemias: Secondary | ICD-10-CM | POA: Diagnosis not present

## 2023-09-19 DIAGNOSIS — E1169 Type 2 diabetes mellitus with other specified complication: Secondary | ICD-10-CM

## 2023-09-19 DIAGNOSIS — G5793 Unspecified mononeuropathy of bilateral lower limbs: Secondary | ICD-10-CM | POA: Diagnosis not present

## 2023-09-19 DIAGNOSIS — E1140 Type 2 diabetes mellitus with diabetic neuropathy, unspecified: Secondary | ICD-10-CM

## 2023-09-19 DIAGNOSIS — E538 Deficiency of other specified B group vitamins: Secondary | ICD-10-CM

## 2023-09-19 DIAGNOSIS — Z9884 Bariatric surgery status: Secondary | ICD-10-CM

## 2023-09-19 DIAGNOSIS — Z7985 Long-term (current) use of injectable non-insulin antidiabetic drugs: Secondary | ICD-10-CM

## 2023-09-19 DIAGNOSIS — K5909 Other constipation: Secondary | ICD-10-CM | POA: Diagnosis not present

## 2023-09-19 MED ORDER — LINACLOTIDE 72 MCG PO CAPS: 72.0000 ug | ORAL_CAPSULE | Freq: Every day | ORAL | 0 refills | Status: DC

## 2023-09-19 MED ORDER — PNV PRENATAL PLUS MULTIVIT+DHA 27-1 & 312 MG PO MISC: ORAL | 1 refills | Status: DC

## 2023-09-19 MED ORDER — METFORMIN HCL 500 MG PO TABS: 500.0000 mg | ORAL_TABLET | Freq: Two times a day (BID) | ORAL | 1 refills | Status: DC

## 2023-09-19 MED ORDER — NEPHRO-VITE RX 1 MG PO TABS: 1.0000 | ORAL_TABLET | Freq: Every day | ORAL | 1 refills | Status: AC

## 2023-09-19 MED ORDER — SEMAGLUTIDE (2 MG/DOSE) 8 MG/3ML ~~LOC~~ SOPN: 2.0000 mg | PEN_INJECTOR | SUBCUTANEOUS | 0 refills | Status: DC

## 2023-09-19 NOTE — Progress Notes (Signed)
 Karen Brennan, D.O.  ABFM, ABOM Specializing in Clinical Bariatric Medicine  Office located at: [address] [place], [place]  [address]   Assessment and Plan:  No orders of the defined types were placed in this encounter.  Medications Discontinued During This Encounter  Medication Reason   Semaglutide, 1 MG/DOSE, 4 MG/3ML SOPN    linaclotide (LINZESS) 72 MCG capsule Reorder   metFORMIN (GLUCOPHAGE) 500 MG tablet Reorder   Prenatal Vit-Fe Fum-FA-Omega (PNV PRENATAL PLUS MULTIVIT+DHA) 27-1 & 312 MG MISC Reorder   B Complex-C-Folic Acid (B COMPLEX-VITAMIN C-FOLIC ACID) 1 MG tablet Reorder    Meds ordered this encounter  Medications   B Complex-C-Folic Acid (B COMPLEX-VITAMIN C-FOLIC ACID) 1 MG tablet    Sig: Take 1 tablet by mouth daily with breakfast.    Dispense:  90 tablet    Refill:  1   linaclotide (LINZESS) 72 MCG capsule    Sig: Take 1 capsule (72 mcg total) by mouth daily before breakfast.    Dispense:  90 capsule    Refill:  0   metFORMIN (GLUCOPHAGE) 500 MG tablet    Sig: Take 1 tablet (500 mg total) by mouth 2 (two) times daily with a meal.    Dispense:  60 tablet    Refill:  1   Prenatal Vit-Fe Fum-FA-Omega (PNV PRENATAL PLUS MULTIVIT+DHA) 27-1 & 312 MG MISC    Sig: 1 po qd    Dispense:  90 each    Refill:  1   Semaglutide, 2 MG/DOSE, 8 MG/3ML SOPN    Sig: Inject 2 mg as directed once a week.    Dispense:  9 mL    Refill:  0     FOR THE DISEASE OF OBESITY:  BMI 40.0-44.9, adult (HCC)- current BMI 42.33 Morbid obesity (HCC)-starting bmi 58.45/date 02/01/21 Assessment & Plan: Since last office visit on 08/27/2023, patient's muscle mass has decreased by 1 lb. Fat mass has increased by 4 lbs. Total body water has increased by 0.4 lbs.  Counseling done on how various foods will affect these numbers and how to maximize success  Total lbs lost to date: 124 lbs Total weight loss percentage to date: 27.77%    Recommended Dietary Goals Karen Brennan is  currently in the action stage of change. As such, her goal is to continue weight management plan.  She has agreed to: continue current plan   Behavioral Intervention We discussed the following today: continue to work on implementation of reduced calorie nutritional plan and continue to practice mindfulness when eating  Additional resources provided today: Handout on non-starchy vegetables, Healthy sources of protein, Handout on balanced plate concepts and Handout on Examples of Low Glycemic Index and Low Calorie Fruits & Vegetables  Evidence-based interventions for health behavior change were utilized today including the discussion of self monitoring techniques, problem-solving barriers and SMART goal setting techniques.  Regarding patient's less desirable eating habits and patterns, we employed the technique of small changes.   Pt will specifically work on: Increasing more exercise for next visit.    Recommended Physical Activity Goals Karen Brennan has been advised to work up to 150 minutes of moderate intensity aerobic activity a week and strengthening exercises 2-3 times per week for cardiovascular health, weight loss maintenance and preservation of muscle mass.   She has agreed to :  Increase the intensity, frequency or duration of aerobic exercises     Pharmacotherapy We both agreed to : Increase Ozempic dose oto 2 mg once weekly  FOR ASSOCIATED CONDITIONS ADDRESSED TODAY:  Type 2 diabetes mellitus with obesity (HCC) Assessment & Plan: Karen Brennan is taking Metformin 500 mg twice daily and Semaglutide 1 mg once weekly. Pt denies any GI upset. Hunger/cravings not well controlled, however pt admits to not eating enough protein. Encouraged pt to ensure she is getting an adequate amount of protein daily. Will increase Ozempic to 2 mg once weekly. Reminded pt of risks/benefits of this medication. She verbalizes agreement to this dose change. Continue following RCNP.   Relevant Orders: -      metFORMIN HCl; Take 1 tablet (500 mg total) by mouth 2 (two) times daily with a meal.  Dispense: 60 tablet; Refill: 1 -     Semaglutide (2 MG/DOSE); Inject 2 mg as directed once a week.  Dispense: 9 mL; Refill: 0   Other constipation Assessment & Plan: Karen Brennan is taking Linzess 72 mcg as needed. She denies any recurrent symptoms with this condition. Pt endorses having bowel movements about every other day. No concerns today in this regard. Will monitor condition.  Relevant Orders: -     linaCLOtide; Take 1 capsule (72 mcg total) by mouth daily before breakfast.  Dispense: 90 capsule; Refill: 0   Other iron deficiency anemia Assessment & Plan: Karen Brennan takes a prenatal vitamin once daily for iron deficiency. Pt denies any complaints with current supplementation regimen. Will continue to monitor.   Relevant Orders: -     PNV Prenatal Plus Multivit+DHA; 1 po qd  Dispense: 90 each; Refill: 1   B12 deficiency S/P gastric bypass Assessment & Plan: Karen Brennan is taking a B complex vitamin for this condition. Tolerating well, no adverse SE. Continue supplement at current dose. Will recheck levels 3 months from last obtained results.    Relevant Orders: -     Nephro-Vite Rx; Take 1 tablet by mouth daily with breakfast.  Dispense: 90 tablet; Refill: 1   Type 2 diabetes mellitus with diabetic neuropathy, without long-term current use of insulin (HCC) Assessment & Plan: Karen Brennan was prescribed Gabapentin 300 mg 3 times daily, however she has been spacing out doses to stretch out medication because she could not get it filled. Pt has concerns about numbness and cold feeling in feet. She has not been able to exercise as frequently because of these symptoms. Suggested to pt there may have been an increase in symptoms d/t a decrease in the Gabapentin dose. Encouraged to f/up with physician to clarify cause of neuropathic symptoms. Will monitor alongside specialist.   Follow up:   Return in about 2 months  (around 11/19/2023). She was informed of the importance of frequent follow up visits to maximize her success with intensive lifestyle modifications for her multiple health conditions.  Subjective:   Chief complaint: Obesity Karen Brennan is here to discuss her progress with her obesity treatment plan. She is on the the Category 3 Plan and states she is following her eating plan approximately 30% of the time. She states she is walking 20 minutes 3 days per week.  Interval History:  Karen Brennan is here for a follow up office visit. Since last OV on 08/27/2023, Karen Brennan is up 4 lbs. She reports it has been more challenging to get foods d/t expenses. Pt also has been exercising minimally as she is experiencing neuropathic symptoms in her feet.   Pharmacotherapy for weight loss: She is currently taking Metformin 500 mg twice daily and Semaglutide 1 mg once weekly.   Review of Systems:  Pertinent positives were addressed with  patient today.  Reviewed by clinician on day of visit: allergies, medications, problem list, medical history, surgical history, family history, social history, and previous encounter notes.  Weight Summary and Biometrics   Weight Lost Since Last Visit: 0  Weight Gained Since Last Visit: 4lb    Vitals Temp: 98.7 F (37.1 C) BP: 126/84 Pulse Rate: 78 SpO2: 99 %   Anthropometric Measurements Height: 6\' 1"  (1.854 m) Weight: (!) 320 lb 12.8 oz (145.5 kg) BMI (Calculated): 42.33 Weight at Last Visit: 320lb Weight Lost Since Last Visit: 0 Weight Gained Since Last Visit: 4lb Starting Weight: 443lb Total Weight Loss (lbs): 124 lb (56.2 kg) Peak Weight: 505lb   Body Composition  Body Fat %: 52.6 % Fat Mass (lbs): 168 lbs Muscle Mass (lbs): 144 lbs Total Body Water (lbs): 102 lbs Visceral Fat Rating : 16   Other Clinical Data Fasting: No Labs: No Today's Visit #: 39 Starting Date: 02/01/21    Objective:   PHYSICAL EXAM: Blood pressure 126/84, pulse 78,  temperature 98.7 F (37.1 C), height 6\' 1"  (1.854 m), weight (!) 320 lb 12.8 oz (145.5 kg), SpO2 99%. Body mass index is 42.32 kg/m.  General: she is overweight, cooperative and in no acute distress. PSYCH: Has normal mood, affect and thought process.   HEENT: EOMI, sclerae are anicteric. Lungs: Normal breathing effort, no conversational dyspnea. Extremities: Moves * 4 Neurologic: A and O * 3, good insight  DIAGNOSTIC DATA REVIEWED: BMET    Component Value Date/Time   NA 142 07/06/2023 0954   K 4.6 07/06/2023 0954   CL 101 07/06/2023 0954   CO2 27 07/06/2023 0954   GLUCOSE 83 07/06/2023 0954   GLUCOSE 208 (H) 07/04/2022 1037   BUN 13 07/06/2023 0954   CREATININE 0.75 07/06/2023 0954   CREATININE 0.82 07/05/2016 0942   CALCIUM 9.5 07/06/2023 0954   GFRNONAA >60 07/04/2022 1037   GFRNONAA >89 07/05/2016 0942   GFRAA 99 02/12/2020 1423   GFRAA >89 07/05/2016 0942   Lab Results  Component Value Date   HGBA1C 5.5 08/08/2023   HGBA1C 5.7 09/15/2011   Lab Results  Component Value Date   INSULIN 11.0 01/12/2022   INSULIN 22.7 02/01/2021   Lab Results  Component Value Date   TSH 1.150 04/11/2023   CBC    Component Value Date/Time   WBC 8.7 07/24/2023 1112   WBC 10.4 07/04/2022 1037   RBC 4.54 07/24/2023 1112   RBC 4.76 07/04/2022 1037   HGB 13.3 07/24/2023 1112   HCT 40.6 07/24/2023 1112   PLT 357 07/24/2023 1112   MCV 89 07/24/2023 1112   MCH 29.3 07/24/2023 1112   MCH 29.6 07/04/2022 1037   MCHC 32.8 07/24/2023 1112   MCHC 32.5 07/04/2022 1037   RDW 11.8 07/24/2023 1112   Iron Studies    Component Value Date/Time   IRON 101 08/08/2023 1029   TIBC 407 08/08/2023 1029   FERRITIN 52 08/08/2023 1029   IRONPCTSAT 25 08/08/2023 1029   Lipid Panel     Component Value Date/Time   CHOL 126 02/16/2023 1605   TRIG 62 02/16/2023 1605   HDL 57 02/16/2023 1605   CHOLHDL 2.2 02/16/2023 1605   CHOLHDL 3.8 12/09/2014 1102   VLDL 25 12/09/2014 1102   LDLCALC 56  02/16/2023 1605   Hepatic Function Panel     Component Value Date/Time   PROT 6.3 04/11/2023 1050   ALBUMIN 4.1 04/11/2023 1050   AST 20 04/11/2023 1050   ALT 16  04/11/2023 1050   ALKPHOS 56 04/11/2023 1050   BILITOT 1.1 04/11/2023 1050   BILIDIR 0.1 02/02/2009 2254   IBILI 0.6 02/02/2009 2254      Component Value Date/Time   TSH 1.150 04/11/2023 1050   Nutritional Lab Results  Component Value Date   VD25OH 42.2 08/08/2023   VD25OH 34.9 04/11/2023   VD25OH 45.6 10/02/2022    Attestations:   I, Camryn Mix, acting as a Stage manager for [employer], DO., have compiled all relevant documentation for today's office visit on behalf of Marceil Sensor, DO, while in the presence of [employer], DO.  I have reviewed the above documentation for accuracy and completeness, and I agree with the above. Karen Brennan, D.O.  The 21st Century Cures Act was signed into law in 2016 which includes the topic of electronic health records.  This provides immediate access to information in MyChart.  This includes consultation notes, operative notes, office notes, lab results and pathology reports.  If you have any questions about what you read please let us  know at your next visit so we can discuss your concerns and take corrective action if need be.  We are right here with you.

## 2023-09-20 ENCOUNTER — Encounter: Payer: Self-pay | Admitting: Family Medicine

## 2023-09-20 ENCOUNTER — Ambulatory Visit (INDEPENDENT_AMBULATORY_CARE_PROVIDER_SITE_OTHER): Admitting: Family Medicine

## 2023-09-20 VITALS — BP 121/80 | HR 87 | Ht 73.0 in | Wt 326.0 lb

## 2023-09-20 DIAGNOSIS — R0989 Other specified symptoms and signs involving the circulatory and respiratory systems: Secondary | ICD-10-CM

## 2023-09-20 DIAGNOSIS — Z7985 Long-term (current) use of injectable non-insulin antidiabetic drugs: Secondary | ICD-10-CM

## 2023-09-20 DIAGNOSIS — E1141 Type 2 diabetes mellitus with diabetic mononeuropathy: Secondary | ICD-10-CM | POA: Diagnosis not present

## 2023-09-20 DIAGNOSIS — G5793 Unspecified mononeuropathy of bilateral lower limbs: Secondary | ICD-10-CM

## 2023-09-20 MED ORDER — GABAPENTIN 300 MG PO CAPS: 300.0000 mg | ORAL_CAPSULE | Freq: Three times a day (TID) | ORAL | 1 refills | Status: DC

## 2023-09-20 NOTE — Progress Notes (Cosign Needed)
   SUBJECTIVE:   CHIEF COMPLAINT / HPI:  Karen Brennan is a 48 y.o. female with a pertinent past medical history of neuropathic pain of bilateral feet, plantar fasciitis, and T2DM well controlled on metformin and semaglutide presenting to the clinic for numbness of feet and toes for 4-5 days.  Foot/toe numbness Numbness under metatarsals of bilateral feet started acutely 4-5 days ago. Normally has tingling and burning of feet due to neuropathy, but only occasionally has numbness. This is the worst numbness she has ever had and the most prolonged. Stepped on small piece of glass 1.5 weeks ago.  Did not notice, later pulled it out, no visible injuries now. Clarified that patient did not notice any bleeding or lacerations at site where she stepped on glass and it was just stuck to the surface of the foot.  Diabetes Last A1c 5.5 on 08/08/2023 Medications: Metformin 500 mg BID, semaglutide 2 mg weekly Adherence: Good Eye exam: UTD Foot exam: DUE Microalbumin: UTD Statin: Atorvastatin 20 mg No symptoms of hypoglycemia, polyuria, polydipsia, foot ulcers/trauma Endorses numbness of BLE, mild tingling in bilateral hands   PERTINENT PMH / PSH: T2DM, diabetic neuropathy, plantar fasciitis Roux-en-Y gastric bypass 2021 on multiple nutritional supplements   OBJECTIVE:   BP 121/80   Pulse 87   Ht 6\' 1"  (1.854 m)   Wt (!) 326 lb (147.9 kg)   SpO2 98%   BMI 43.01 kg/m   General: Age-appropriate, resting comfortably in chair, NAD, alert and at baseline. Cardiovascular: Regular rate and rhythm. Normal S1/S2. No murmurs, rubs, or gallops appreciated. 2+ radial pulses. Extremities: No peripheral edema bilaterally. Diabetic Foot Exam - Simple   Simple Foot Form Visual Inspection No deformities, no ulcerations, no other skin breakdown bilaterally: Yes Sensation Testing See comments: Yes Pulse Check See comments: Yes Comments Intact to touch bilaterally, but reduced discrimination in  point sensation with monofilament. Poorly palpable DP an PT pulses bilaterally, toes mildly cool to touch.  Capillary refill 3 seconds. Doppler US with positive arterial DP and PT pulses bilaterally.     ASSESSMENT/PLAN:   Assessment & Plan Neuropathic pain of both feet Suspect primary etiology of decreased sensation is neuropathy given that patient has been rationing her gabapentin due to running out.  Sensation reassuringly fairly intact on monofilament testing, though reduced.  Has a history of gastric bypass making subacute combined degeneration or other nutritional deficiency more likely, but vitamin B1, 12, B6, folate, and iron WNL on 2/26 and patient reports good adherence to nutritional supplements.  Did consider plantar fasciitis given patient's history of condition and indication of numbness initially on plantar surface of foot, but would not expect extension to dorsal surface and would expect burning rather than anaesthesia. - Refilled gabapentin 300 mg TID, can titrate up if not improving in 1-2 weeks Decreased pulses in feet Initially unable to palpate DP and PT pulses, but reassured by Doppler US testing and absence of pain.  Do not suspect acute limb ischemia.  Possible claudication symptoms on history, though unclear.  Extremities do have adequate capillary refill.  Suspect PAD. - Referral to Vascular Surgery for diagnostic ABI +/- [place] Type 2 diabetes mellitus with diabetic mononeuropathy, without long-term current use of insulin (HCC) A1c well controlled on metfromin and semaglutide, but small fiber neuropathy still possible. - Continue metformin and semaglutide  Return if symptoms worsen or fail to improve.  Jathan Balling Sharion Dove, MD [facility]

## 2023-09-20 NOTE — Assessment & Plan Note (Signed)
 Suspect primary etiology of decreased sensation is neuropathy given that patient has been rationing her gabapentin due to running out.  Sensation reassuringly fairly intact on monofilament testing, though reduced.  Has a history of gastric bypass making subacute combined degeneration or other nutritional deficiency more likely, but vitamin B1, 12, B6, folate, and iron WNL on 2/26 and patient reports good adherence to nutritional supplements.  Did consider plantar fasciitis given patient's history of condition and indication of numbness initially on plantar surface of foot, but would not expect extension to dorsal surface and would expect burning rather than anaesthesia. - Refilled gabapentin 300 mg TID, can titrate up if not improving in 1-2 weeks

## 2023-09-20 NOTE — Assessment & Plan Note (Signed)
 A1c well controlled on metfromin and semaglutide, but small fiber neuropathy still possible. - Continue metformin and semaglutide

## 2023-09-20 NOTE — Patient Instructions (Signed)
 It was great to see you today! Thank you for choosing [facility] for your primary care.  Today we addressed: 1. Foot numbness I suspect your foot numbness is due to a combination of your neuropathy (running out of gabapentin) and possibly decreased blood circulation with the coldness sensation.  I have refilled your gabapentin, please resume taking that 3 times a day as prescribed.  You can call us back in 1 to 2 weeks if you do not notice any change and we can consider increasing gabapentin slightly.  Most importantly, I am referring you to our vascular doctors to take a look at your blood circulation in your feet to make sure that the blood is flowing well.  I am reassured that I was able to obtain pulses with my ultrasound in the clinic and I do not think there is any emergency right now.  Thank you for coming to see [place] at [facility] and for the opportunity to care for you! Maranda Marte, MD 09/20/2023, 10:04 AM

## 2023-09-21 DIAGNOSIS — G5793 Unspecified mononeuropathy of bilateral lower limbs: Secondary | ICD-10-CM | POA: Diagnosis not present

## 2023-09-24 DIAGNOSIS — G5793 Unspecified mononeuropathy of bilateral lower limbs: Secondary | ICD-10-CM | POA: Diagnosis not present

## 2023-09-26 DIAGNOSIS — G5793 Unspecified mononeuropathy of bilateral lower limbs: Secondary | ICD-10-CM | POA: Diagnosis not present

## 2023-09-28 DIAGNOSIS — G5793 Unspecified mononeuropathy of bilateral lower limbs: Secondary | ICD-10-CM | POA: Diagnosis not present

## 2023-10-01 DIAGNOSIS — G5793 Unspecified mononeuropathy of bilateral lower limbs: Secondary | ICD-10-CM | POA: Diagnosis not present

## 2023-10-02 ENCOUNTER — Ambulatory Visit: Admitting: Student

## 2023-10-02 ENCOUNTER — Telehealth: Payer: Self-pay

## 2023-10-02 VITALS — BP 111/67 | HR 95 | Ht 73.0 in | Wt 327.4 lb

## 2023-10-02 DIAGNOSIS — G5793 Unspecified mononeuropathy of bilateral lower limbs: Secondary | ICD-10-CM | POA: Diagnosis not present

## 2023-10-02 DIAGNOSIS — N390 Urinary tract infection, site not specified: Secondary | ICD-10-CM

## 2023-10-02 LAB — POCT URINALYSIS DIP (MANUAL ENTRY)
Bilirubin, UA: NEGATIVE
Blood, UA: NEGATIVE
Glucose, UA: NEGATIVE mg/dL
Nitrite, UA: POSITIVE — AB
Protein Ur, POC: NEGATIVE mg/dL
Spec Grav, UA: 1.025 (ref 1.010–1.025)
Urobilinogen, UA: 0.2 U/dL
pH, UA: 5.5 (ref 5.0–8.0)

## 2023-10-02 LAB — POCT UA - MICROSCOPIC ONLY: RBC, Urine, Miroscopic: NONE SEEN (ref 0–2)

## 2023-10-02 MED ORDER — CEPHALEXIN 500 MG PO CAPS: 500.0000 mg | ORAL_CAPSULE | Freq: Two times a day (BID) | ORAL | 0 refills | Status: AC

## 2023-10-02 NOTE — Progress Notes (Addendum)
    SUBJECTIVE:   CHIEF COMPLAINT / HPI:   Possible UTI  Suspected Neuropathic Foot Pain:  - The patient presents with sharp shooting pains in their foot, specifically in the toe and the area towards the toe.  - The area is swollen and the pain is described as 'horrible', especially with walking.  - They are currently taking gabapentin  300mg  three times a day for the neuropathy.  - They also report a bad smell in their urine, suspecting a urinary tract infection (UTI).  - They have a history of diabetes, which is reportedly well controlled. - Last seen 09/20/23 for same pain in the feet  - B1, B12, B6, folate & iron nml 02/26 as reported by previous note - Was instructed to continue with Gabapentin  300 TID and possibly titrate up if not working   PERTINENT  PMH / PSH: Reviewed   OBJECTIVE:   BP 111/67   Pulse 95   Ht 6\' 1"  (1.854 m)   Wt (!) 327 lb 6.4 oz (148.5 kg)   SpO2 100%   BMI 43.20 kg/m   General: Alert and oriented in no apparent distress Heart: Regular rate and rhythm with no murmurs appreciated Lungs: CTA bilaterally, no wheezing Abdomen: Bowel sounds present, no abdominal pain Skin: Warm and dry Extremities: No lower extremity edema Foot Exam: Diminished DP pulses, FROM, TTP over metatarsal pads-plantar aspect b/l, no ulcerations    ASSESSMENT/PLAN:   Assessment & Plan Urinary tract infection without hematuria, site unspecified UA with nitrite and leuks, treat with Keflex  -- pansensitive EColi in the past. Ucx sent  Neuropathic pain of both feet Started a few weeks ago, well controlled diabetic. Previous vitamin testing normal. Unknown etiology, seeing vascular in future for ABI per last note. Increased Gabapentin  to 300 in AM and midday then 600 at night. If still symptomatic in 1-2 weeks, call PCP. Taper up gabapentin  as able, change to pregabalin if indicated and consider NCS for neuropathic symptoms. Discussed with patient and she verbalized understanding.       Ernestina Headland, MD [facility]

## 2023-10-02 NOTE — Assessment & Plan Note (Addendum)
 Started a few weeks ago, well controlled diabetic. Previous vitamin testing normal. Unknown etiology, seeing vascular in future for ABI per last note. Increased Gabapentin  to 300 in AM and midday then 600 at night. If still symptomatic in 1-2 weeks, call PCP. Taper up gabapentin  as able, change to pregabalin if indicated and consider NCS for neuropathic symptoms. Discussed with patient and she verbalized understanding.

## 2023-10-02 NOTE — Patient Instructions (Signed)
 It was great to see you today! Thank you for choosing [facility] for your primary care.  Today we addressed: For UTI: We will start Keflex  twice a day for 7 days  For pain in the feet: take 300 mg in morning and midday, then take 600 mg at night  Call PCP if symptoms persist over the next couple of weeks   If you haven't already, sign up for My Chart to have easy access to your labs results, and communication with your primary care physician.   Please arrive 15 minutes before your appointment to ensure smooth check in process.  We appreciate your efforts in making this happen.  Thank you for allowing me to participate in your care, Ernestina Headland, MD 10/02/2023, 10:38 AM PGY-3, [facility]

## 2023-10-02 NOTE — Telephone Encounter (Signed)
 Patient calls nurse line regarding gabapentin  prescription.   Patient reports that she was advised to increase dosage of gabapentin , however, pharmacy did not receive new rx.   Patient will need new rx with updated directions, as pharmacy will be unable to fill early without new directions/ prescription.   Forwarding to prescriber.   Elsie Halo, RN

## 2023-10-03 DIAGNOSIS — G5793 Unspecified mononeuropathy of bilateral lower limbs: Secondary | ICD-10-CM | POA: Diagnosis not present

## 2023-10-05 ENCOUNTER — Other Ambulatory Visit: Payer: Self-pay | Admitting: Student

## 2023-10-05 DIAGNOSIS — G5793 Unspecified mononeuropathy of bilateral lower limbs: Secondary | ICD-10-CM | POA: Diagnosis not present

## 2023-10-05 MED ORDER — GABAPENTIN 300 MG PO CAPS: ORAL_CAPSULE | ORAL | 3 refills | Status: DC

## 2023-10-08 DIAGNOSIS — G5793 Unspecified mononeuropathy of bilateral lower limbs: Secondary | ICD-10-CM | POA: Diagnosis not present

## 2023-10-09 ENCOUNTER — Telehealth (INDEPENDENT_AMBULATORY_CARE_PROVIDER_SITE_OTHER): Payer: Self-pay | Admitting: Family Medicine

## 2023-10-09 NOTE — Telephone Encounter (Signed)
 Returned pt call and LVM to give us  a call back.

## 2023-10-09 NOTE — Telephone Encounter (Signed)
 04/29 pt wants a call, says medication is making her sick to her stomach and other side effects

## 2023-10-10 DIAGNOSIS — G5793 Unspecified mononeuropathy of bilateral lower limbs: Secondary | ICD-10-CM | POA: Diagnosis not present

## 2023-10-10 NOTE — Telephone Encounter (Signed)
 Called patient to informed her to hold off taking her Ozempic  while she still taking her ABX. Patient will go back to her lower dose and restart her Ozempic  once she finish her ABX Rx. She didn't know switch Rx made her stomach upset. Pt verbalized understanding. Pt had no questions at this time but was encouraged to call back if questions arise.

## 2023-10-11 ENCOUNTER — Encounter: Payer: Self-pay | Admitting: Family Medicine

## 2023-10-11 ENCOUNTER — Ambulatory Visit (INDEPENDENT_AMBULATORY_CARE_PROVIDER_SITE_OTHER): Admitting: Family Medicine

## 2023-10-11 ENCOUNTER — Telehealth: Payer: Self-pay

## 2023-10-11 VITALS — BP 109/76 | HR 100 | Ht 73.0 in | Wt 324.6 lb

## 2023-10-11 DIAGNOSIS — J302 Other seasonal allergic rhinitis: Secondary | ICD-10-CM | POA: Diagnosis not present

## 2023-10-11 DIAGNOSIS — R55 Syncope and collapse: Secondary | ICD-10-CM | POA: Diagnosis present

## 2023-10-11 DIAGNOSIS — E669 Obesity, unspecified: Secondary | ICD-10-CM

## 2023-10-11 DIAGNOSIS — E119 Type 2 diabetes mellitus without complications: Secondary | ICD-10-CM

## 2023-10-11 DIAGNOSIS — Z7985 Long-term (current) use of injectable non-insulin antidiabetic drugs: Secondary | ICD-10-CM

## 2023-10-11 DIAGNOSIS — E1169 Type 2 diabetes mellitus with other specified complication: Secondary | ICD-10-CM | POA: Diagnosis not present

## 2023-10-11 LAB — POCT CBG (FASTING - GLUCOSE)-MANUAL ENTRY: Glucose Fasting, POC: 81 mg/dL (ref 70–99)

## 2023-10-11 MED ORDER — ACCU-CHEK AVIVA PLUS VI STRP
ORAL_STRIP | 12 refills | Status: DC
Start: 2023-10-11 — End: 2023-10-12

## 2023-10-11 MED ORDER — ACCU-CHEK AVIVA PLUS W/DEVICE KIT: PACK | 0 refills | Status: DC

## 2023-10-11 MED ORDER — ALBUTEROL SULFATE HFA 108 (90 BASE) MCG/ACT IN AERS: INHALATION_SPRAY | RESPIRATORY_TRACT | 1 refills | Status: DC

## 2023-10-11 MED ORDER — BLOOD GLUCOSE MONITORING SUPPL DEVI: 1.0000 | Freq: Two times a day (BID) | 0 refills | Status: DC

## 2023-10-11 MED ORDER — BLOOD GLUCOSE MONITOR KIT
PACK | 0 refills | Status: AC
Start: 2023-10-11 — End: ?

## 2023-10-11 MED ORDER — ATORVASTATIN CALCIUM 20 MG PO TABS: 20.0000 mg | ORAL_TABLET | Freq: Every day | ORAL | 3 refills | Status: AC

## 2023-10-11 MED ORDER — ACCU-CHEK SOFTCLIX LANCETS MISC: 6 refills | Status: AC

## 2023-10-11 NOTE — Progress Notes (Signed)
    SUBJECTIVE:   CHIEF COMPLAINT / HPI:   Dizziness for several months since beginning of the year, combination of room spinning sensations and lightheadedness Previously seen diagnosed with vertigo and prescribed meclizine , that did not help [place] on 4/27 after getting out of bed, landed on her knees.  Denies hitting her head but reports that she lost consciousness for a few seconds.  Witnessed by children.  Denies any symptoms prior to the episode and returned to baseline immediately when she woke up.  No history of syncope.  Frequently feels like she is about to pass out and has to hold onto something to prevent her from falling.  Reports 100 pound weight loss over the last year, takes Ozempic .  Has had nausea with the Ozempic  and while taking antibiotics recently for a UTI.  No chest pain, diaphoresis, vomiting  PERTINENT  PMH / PSH: Hypertension, PVCs, type 2 diabetes, bipolar 1 disorder, history of Roux-en-Y gastric bypass  OBJECTIVE:   BP 109/76   Pulse 100   Ht 6\' 1"  (1.854 m)   Wt (!) 324 lb 9.6 oz (147.2 kg)   SpO2 100%   BMI 42.83 kg/m   General: well appearing, NAD Cardiovascular: RRR, no m/r/g Respiratory: normal work of breathing on RA, CTAB Ambulates independently without difficulty  ASSESSMENT/PLAN:   Assessment & Plan Syncope, unspecified syncope type Dizziness for several months now s/p syncopal episode 4/27.  Suspect this to be due to orthostatic hypotension as her orthostatic vital were positive today, with associated dizziness during measurement.  She has had a recent significant weight loss over the last year and on multiple blood pressure medications, so likely just needs these titrated down.  She also has issues with vertigo and room spinning sensation, discussed that we could consider vestibular rehab therapy if vertigo persists after we address her hypotension - Stop HCTZ until follow-up appointment in 2 weeks - Advised to keep blood pressure log at home,  reviewed fall precautions and slowly standing from sitting position to prevent falls - Echo ordered to rule out structural abnormality that could be causing syncope Type 2 diabetes mellitus with other specified complication, unspecified whether long term insulin  use (HCC) CBG normal today.  Refilled diabetic supplies   Sarahann Cumins, DO [facility]

## 2023-10-11 NOTE — Assessment & Plan Note (Signed)
 CBG normal today.  Refilled diabetic supplies

## 2023-10-11 NOTE — Patient Instructions (Signed)
 Good to see you today - Thank you for coming in  Things we discussed today: Please stop taking your hydrochlorothiazide  until your next visit.  Check your blood pressure several times a week.  Keep a log of your blood pressures to bring to your next visit. Stay hydrated I have ordered an echo of your heart to make sure there is no abnormality causing you to pass out.  We will schedule this and call you.   Please always bring your medication bottles  Come back in 2 weeks or sooner if you begin falling more frequently or your symptoms worsen

## 2023-10-11 NOTE — Telephone Encounter (Signed)
 Patient calls nurse line in regards to DM meter and testing strips.   She reports the pharmacy did not receive these two prescriptions. She reports they only received the lancets.   DM meter and strips were set to print.  Both resent to patients pharmacy.

## 2023-10-12 DIAGNOSIS — G5793 Unspecified mononeuropathy of bilateral lower limbs: Secondary | ICD-10-CM | POA: Diagnosis not present

## 2023-10-12 MED ORDER — ACCU-CHEK GUIDE TEST VI STRP: ORAL_STRIP | 12 refills | Status: AC

## 2023-10-12 MED ORDER — ACCU-CHEK GUIDE W/DEVICE KIT: PACK | 0 refills | Status: DC

## 2023-10-12 NOTE — Addendum Note (Signed)
 Addended by: Rechelle Niebla C on: 10/12/2023 12:24 PM   Modules accepted: Orders

## 2023-10-12 NOTE — Telephone Encounter (Signed)
 Patient returns call to nurse line stating that glucometer supplies are not covered by insurance.   Spoke with [employer] is covered by insurance.   Resending preferred supplies.   Called pharmacy. Pharmacist reports that meter is not covered under [employer] care advantage plan.   Called patient. She reports that she has [employer].   She will contact pharmacy and provide new insurance information. She will call back with any issues.   Elsie Halo, RN

## 2023-10-15 DIAGNOSIS — G5793 Unspecified mononeuropathy of bilateral lower limbs: Secondary | ICD-10-CM | POA: Diagnosis not present

## 2023-10-17 ENCOUNTER — Ambulatory Visit (INDEPENDENT_AMBULATORY_CARE_PROVIDER_SITE_OTHER): Admitting: Family Medicine

## 2023-10-17 DIAGNOSIS — G5793 Unspecified mononeuropathy of bilateral lower limbs: Secondary | ICD-10-CM | POA: Diagnosis not present

## 2023-10-19 DIAGNOSIS — G5793 Unspecified mononeuropathy of bilateral lower limbs: Secondary | ICD-10-CM | POA: Diagnosis not present

## 2023-10-22 DIAGNOSIS — G5793 Unspecified mononeuropathy of bilateral lower limbs: Secondary | ICD-10-CM | POA: Diagnosis not present

## 2023-10-24 DIAGNOSIS — G5793 Unspecified mononeuropathy of bilateral lower limbs: Secondary | ICD-10-CM | POA: Diagnosis not present

## 2023-10-25 ENCOUNTER — Encounter: Payer: Self-pay | Admitting: Family Medicine

## 2023-10-25 ENCOUNTER — Ambulatory Visit (INDEPENDENT_AMBULATORY_CARE_PROVIDER_SITE_OTHER): Admitting: Family Medicine

## 2023-10-25 VITALS — BP 112/64 | HR 84 | Ht 73.0 in | Wt 332.6 lb

## 2023-10-25 DIAGNOSIS — Z9884 Bariatric surgery status: Secondary | ICD-10-CM

## 2023-10-25 DIAGNOSIS — F250 Schizoaffective disorder, bipolar type: Secondary | ICD-10-CM | POA: Diagnosis not present

## 2023-10-25 DIAGNOSIS — F4310 Post-traumatic stress disorder, unspecified: Secondary | ICD-10-CM | POA: Diagnosis not present

## 2023-10-25 DIAGNOSIS — R296 Repeated falls: Secondary | ICD-10-CM

## 2023-10-25 DIAGNOSIS — R110 Nausea: Secondary | ICD-10-CM | POA: Diagnosis not present

## 2023-10-25 DIAGNOSIS — M25561 Pain in right knee: Secondary | ICD-10-CM

## 2023-10-25 DIAGNOSIS — M25562 Pain in left knee: Secondary | ICD-10-CM | POA: Diagnosis not present

## 2023-10-25 DIAGNOSIS — F411 Generalized anxiety disorder: Secondary | ICD-10-CM | POA: Diagnosis not present

## 2023-10-25 MED ORDER — VALSARTAN 40 MG PO TABS: 40.0000 mg | ORAL_TABLET | Freq: Every day | ORAL | 3 refills | Status: DC

## 2023-10-25 MED ORDER — ONDANSETRON HCL 4 MG PO TABS: 4.0000 mg | ORAL_TABLET | Freq: Three times a day (TID) | ORAL | 0 refills | Status: DC | PRN

## 2023-10-25 NOTE — Progress Notes (Signed)
    SUBJECTIVE:   CHIEF COMPLAINT / HPI:   Seen on 10/11/2023 for syncope, thought to be due to orthostatic hypotension.  Discontinued hydrochlorothiazide  Now taking metoprolol  25 mg and valsartan  80 mg daily Fell again 1 week ago.  Was getting out of bed and felt lightheaded causing her to fall and land on her bilateral knees.  No loss of consciousness. Overall feels like her symptoms are improving since she stopped her blood pressure medicine Unable to check it at home because she does not have a cuff  Echo scheduled for 6/17 at 10:30  She states that she takes prenatal vitamins with additional vitamins that she cannot remember the name of since her bariatric surgery  Also reports nausea, burping, gastric discomfort since increasing her Ozempic  to 2 mg 2 weeks ago.  Decreased it back to one 1 week ago.  Has been taking Gas-X with no improvement  PERTINENT  PMH / PSH: Hypertension, PVCs, type 2 diabetes, bipolar 1 disorder, history of Roux-en-Y gastric bypass  OBJECTIVE:   BP 112/64   Pulse 84   Ht 6\' 1"  (1.854 m)   Wt (!) 332 lb 9.6 oz (150.9 kg)   LMP  (LMP Unknown) Comment: approx 3 months ago  SpO2 97%   BMI 43.88 kg/m   General: well appearing, NAD Cardiovascular: RRR, no m/r/g Respiratory: normal work of breathing on RA, CTAB Abdomen: Normal bowel sounds Extremities: No tenderness to palpation or bony abnormalities to bilateral knees.  No overlying skin changes.  Able to ambulate independently without difficulty.  ASSESSMENT/PLAN:   Assessment & Plan Falls Improved, suspect these falls are still in setting of orthostatic hypotension.  Blood pressure normal but on the lower end today. - Will decrease valsartan  to 40 mg daily, continue metoprolol  25 mg - Encouraged to buy blood pressure cuff over-the-counter to check blood pressures at home - Follow-up in 2 weeks - BMP collected today to assess for electrolyte abnormality History of bariatric surgery Reassuring that  she is taking vitamins however vitamin deficiency could be contributing to her falls as well. - Provided with a list of vitamins that she needs after having bariatric surgery to compare to the one she is taking at home - Checked B1, B12, vitamin D , iron, folate  Acute pain of both knees Suspect chronic osteoarthritis exacerbated by recent fall.  No sign of acute abnormality on exam today.  Patient would like to go back to sports medicine who she saw earlier this year - Standing knee x-rays ordered prior to returning to sports medicine Nausea Intermittent nausea since increasing her Ozempic  to 2 mg.  Encouraged her to decrease it back to 1 which she has already done. Provided with 10 tablets of 4 mg Zofran  to take as needed for nausea Return if symptoms persist despite decreasing Ozempic  back down   F/u in 2 weeks for BP check  Sarahann Cumins, DO [facility]

## 2023-10-25 NOTE — Patient Instructions (Signed)
 Good to see you today - Thank you for coming in  Things we discussed today: I decreased your valsartan  to 40 mg.  Take this every night. I sent a prescription for Zofran  to take every 8 hours as needed for nausea. I ordered x-rays of your knees.  You can go to [address] to have these done. I am checking some vitamin levels and your electrolytes today.  I will let you know if these are abnormal.   Please always bring your medication bottles  Come back in 2 weeks

## 2023-10-26 ENCOUNTER — Ambulatory Visit: Payer: Self-pay | Admitting: Family Medicine

## 2023-10-26 DIAGNOSIS — G5793 Unspecified mononeuropathy of bilateral lower limbs: Secondary | ICD-10-CM | POA: Diagnosis not present

## 2023-10-29 DIAGNOSIS — G5793 Unspecified mononeuropathy of bilateral lower limbs: Secondary | ICD-10-CM | POA: Diagnosis not present

## 2023-10-29 LAB — FOLATE: Folate: 18.1 ng/mL (ref >3.0)

## 2023-10-29 LAB — BASIC METABOLIC PANEL WITH GFR
BUN/Creatinine Ratio: 20 (ref 9–23)
BUN: 13 mg/dL (ref 6–24)
CO2: 24 mmol/L (ref 20–29)
Calcium: 8.9 mg/dL (ref 8.7–10.2)
Chloride: 104 mmol/L (ref 96–106)
Creatinine, Ser: 0.66 mg/dL (ref 0.57–1.00)
Glucose: 89 mg/dL (ref 70–99)
Potassium: 3.9 mmol/L (ref 3.5–5.2)
Sodium: 142 mmol/L (ref 134–144)
eGFR: 109 mL/min/{1.73_m2} (ref >59)

## 2023-10-29 LAB — VITAMIN B1: Thiamine: 93.2 nmol/L (ref 66.5–200.0)

## 2023-10-29 LAB — IRON: Iron: 55 ug/dL (ref 27–159)

## 2023-10-29 LAB — VITAMIN D 25 HYDROXY (VIT D DEFICIENCY, FRACTURES): Vit D, 25-Hydroxy: 52 ng/mL (ref 30.0–100.0)

## 2023-10-29 LAB — VITAMIN B12: Vitamin B-12: 723 pg/mL (ref 232–1245)

## 2023-10-30 ENCOUNTER — Ambulatory Visit
Admission: RE | Admit: 2023-10-30 | Discharge: 2023-10-30 | Disposition: A | Discharge status: Home / Self Care | Source: Ambulatory Visit | Attending: Family Medicine | Admitting: Family Medicine

## 2023-10-30 DIAGNOSIS — M25561 Pain in right knee: Secondary | ICD-10-CM

## 2023-10-30 DIAGNOSIS — M170 Bilateral primary osteoarthritis of knee: Secondary | ICD-10-CM | POA: Diagnosis not present

## 2023-10-31 DIAGNOSIS — G5793 Unspecified mononeuropathy of bilateral lower limbs: Secondary | ICD-10-CM | POA: Diagnosis not present

## 2023-11-02 ENCOUNTER — Ambulatory Visit (HOSPITAL_BASED_OUTPATIENT_CLINIC_OR_DEPARTMENT_OTHER): Attending: Family Medicine | Admitting: Physical Therapy

## 2023-11-02 ENCOUNTER — Other Ambulatory Visit: Payer: Self-pay

## 2023-11-02 ENCOUNTER — Encounter (HOSPITAL_BASED_OUTPATIENT_CLINIC_OR_DEPARTMENT_OTHER): Payer: Self-pay | Admitting: Physical Therapy

## 2023-11-02 DIAGNOSIS — M19172 Post-traumatic osteoarthritis, left ankle and foot: Secondary | ICD-10-CM | POA: Diagnosis present

## 2023-11-02 DIAGNOSIS — G5793 Unspecified mononeuropathy of bilateral lower limbs: Secondary | ICD-10-CM | POA: Diagnosis not present

## 2023-11-02 DIAGNOSIS — M25372 Other instability, left ankle: Secondary | ICD-10-CM | POA: Diagnosis present

## 2023-11-02 NOTE — Therapy (Signed)
 OUTPATIENT PHYSICAL THERAPY EVALUATION   Patient Name: Karen Brennan MRN: [id number] DOB:1976-04-30, 48 y.o., female Today's Date: 11/02/2023  END OF SESSION:  PT End of Session - 11/02/23 1050     Visit Number 1    Number of Visits 13    Date for PT Re-Evaluation 12/14/23    Authorization Type [employer] MCD    PT Start Time 1015    PT Stop Time 1048    PT Time Calculation (min) 33 min    Activity Tolerance Patient tolerated treatment well    Behavior During Therapy WFL for tasks assessed/performed             Past Medical History:  Diagnosis Date   ANEMIA 08/14/2008   Qualifier: Diagnosis of  By: Chanetta Comes MD, Evangelyne Loja     Anxiety    Asthma    B12 deficiency 08/29/2021   Bipolar 1 disorder (HCC)    Blood transfusion    Cellulitis 07/15/2020   Of right buttock   Chest pain    Depression    Edema of both lower extremities    GERD (gastroesophageal reflux disease)    History of bronchitis    Hypertension    Kidney stones 10/09/2011   Migraines 10/09/2011   "often"   Morbid obesity with BMI of 50.0-59.9, adult (HCC) 08/09/2006   Qualifier: Diagnosis of   By: Chanetta Comes MD, Tamecca Artiga         Pre-diabetes    Schizophrenia (HCC)    SOB (shortness of breath)    Vitamin D  deficiency 08/29/2021   Past Surgical History:  Procedure Laterality Date   ANKLE FRACTURE SURGERY   1990's   "left; put screws in"   ENDOMETRIAL ABLATION  ~ 2011   FRACTURE SURGERY     GASTRIC BY PASS     TUBAL LIGATION  2006   Patient Active Problem List   Diagnosis Date Noted   Severe dysplasia of cervix (CIN III) 08/20/2023   Open wound of foot, right, initial encounter 06/25/2023   Plantar fasciitis 06/25/2023   BMI 45.0-49.9, adult (HCC)-current bmi 46.8 07/12/2022   Chronic constipation 06/14/2022   Seasonal allergies 05/01/2022   Hyperlipidemia associated with type 2 diabetes mellitus (HCC) 12/05/2021   Type II diabetes mellitus (HCC) 08/29/2021   PVC's (premature ventricular contractions)  07/19/2021   Intertrigo 02/16/2020   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 Include strengthening exercises and aquatic therapy in treatment plan.   Rationale for Evaluation and Treatment: Rehabilitation  THERAPY DIAG:  Instability of left ankle joint  Post-traumatic osteoarthritis of left ankle  ONSET DATE: 2-3 mo ago (around Feb 2025) acute on chronic   SUBJECTIVE:  SUBJECTIVE STATEMENT: Broke my ankle when I was a teenager, hardware removed in the past. It swells really bad. I usually wear compression socks and ASO with tennis shoes. Sometimes limited in shoe wear by ankle edema. Two falls in the last 2 months due to dizziness, blacked out and fell on knees. On BP meds- changing due to recent weight loss.   PERTINENT HISTORY:  Peripheral neuropathy- reports occasional sensation of pressure of floor on foot, gabapentin  for management.   PAIN:  Are you having pain? Yes: NPRS scale: 5-6 right now, up to 10/10 Pain location: all around the ankle and radiates Pain description: burning N/T Aggravating factors: standing too long Relieving factors: ibuprofen , tylenol   PRECAUTIONS:  Fall  RED FLAGS: None   WEIGHT BEARING  RESTRICTIONS:  No  FALLS:  Has patient fallen in last 6 months? Yes. Number of falls 5  LIVING ENVIRONMENT: Lives with: lives with their daughter x3 Lives in: House/apartment- no stairs  OCCUPATION:  On disability  PLOF:  Sometimes needs help with bathing and getting off the couch  PATIENT GOALS:  Working on weight loss   OBJECTIVE:  Note: Objective measures were completed at Evaluation unless otherwise noted.  DIAGNOSTIC FINDINGS:  WFL  PATIENT SURVEYS:  LEFS  Extreme difficulty/unable (0), Quite a bit of difficulty (1), Moderate difficulty (2), Little difficulty (3), No difficulty (4) Survey date:  5/23  Any of your usual work, housework or school activities 1  2. Usual hobbies, recreational or sporting activities 0  3. Getting into/out of the bath 0  4. Walking between rooms 1  5. Putting on socks/shoes 1  6. Squatting  0  7. Lifting an object, like a bag of groceries from the floor 1  8. Performing light activities around your home 1  9. Performing heavy activities around your home 0  10. Getting into/out of a car 0  11. Walking 2 blocks 0  12. Walking 1 mile 0  13. Going up/down 10 stairs (1 flight) 0  14. Standing for 1 hour 0  15.  sitting for 1 hour 0  16. Running on even ground 0  17. Running on uneven ground 0  18. Making sharp turns while running fast 0  19. Hopping  0  20. Rolling over in bed 1  Score total:  6     COGNITIVE STATUS: Within functional limits for tasks assessed   SENSATION: Limited due to peripheral neuropathy in bilat feet  EDEMA:  Yes: Lt lateral maleolus   GAIT: Eval: flexed posture, lacking DF at heel strike bilat, antalgic, slow cadence.    Body Part #1 Ankle  EVAL ankle AROM DF: knee flexed-3, knee ext -10  Able to stand with DF at -3 deg, unable to demo standing toe raise  FUNCTIONAL TESTS:  Berg Balance Scale:  Item Test date: 5/23EVAL Test date:  Test date:   Sitting to standing 1. needs minimal aid to  stand or stabilize Insert OPRCBERGREEVAL SmartPhrase at re-test date Insert OPRCBERGREEVAL SmartPhrase at re-test date  2. Standing unsupported 4. able to stand safely for 2 minutes    3. Sitting with back unsupported, feet supported 4. able to sit safely and securely for 2 minutes    4. Standing to sitting 1. sits independently but has uncontrolled descent    5. Pivot transfer  3. able to transfer safely with definite need of hands    6. Standing unsupported with eyes closed 4. able to stand 10 seconds safely  7. Standing unsupported with feet together 4. able to place feet together independently and stand 1 minute safely    8. Reaching forward with outstretched arms while standing 3. can reach forward 12 cm (5 inches)    9. Pick up object from the floor from standing 3. able to pick up slipper but needs supervision    10. Turning to look behind over left and right shoulders while standing 3. looks behind one side only, other side shows less weight shift    11. Turn 360 degrees 2. able to turn 360 degrees safely but slowly    12. Place alternate foot on step or stool while standing unsupported 2. able to complete 4 steps without aid with supervision    13. Standing unsupported one foot in front 3. able to place foot ahead independently and hold 30 seconds    14. Standing on one leg 1. tries to lift leg unable to hold 3 seconds but remains standing independently.      Total Score 38/56 Total Score /56 Total Score /56                                                                                                                                 TREATMENT DATE:   11/02/23: See plan   PATIENT EDUCATION:  Education details: Anatomy of condition, POC, HEP, exercise form/rationale Person educated: Patient Education method: Explanation, Demonstration, Tactile cues, and Verbal cues Education comprehension: verbalized understanding, returned demonstration, verbal cues required, tactile cues  required, and needs further education  HOME EXERCISE PROGRAM: To be established at pool   ASSESSMENT:  CLINICAL IMPRESSION: Patient is a 48 y.o. F who was seen today for physical therapy evaluation and treatment for Lt ankle pain- subacute on chronic onset. Pt has recently lost 130lb and has very limited central stability leading to distal pain and lack of control. Able to ambulate but lacks DF which puts her at a trip risk. Pt will benefit significantly from aquatic therapy for gross biomechanical chain strengthening and endurance challenge. I did introduce her to aquatics today and walked her back to the pool.     OBJECTIVE IMPAIRMENTS: Abnormal gait, decreased activity tolerance, decreased balance, decreased coordination, decreased mobility, difficulty walking, decreased ROM, decreased strength, dizziness, increased edema, impaired flexibility, impaired sensation, improper body mechanics, obesity, and pain.   ACTIVITY LIMITATIONS: standing, squatting, stairs, transfers, bed mobility, bathing, toileting, hygiene/grooming, and locomotion level  PARTICIPATION LIMITATIONS: cleaning, laundry, shopping, and community activity  PERSONAL FACTORS: obesity & weight changes, blood pressure changes/medication management, peripheral neuropathy, recent falls are also affecting patient's functional outcome.   REHAB POTENTIAL: Good  CLINICAL DECISION MAKING: Unstable/unpredictable  EVALUATION COMPLEXITY: High   GOALS: Goals reviewed with patient? Yes  SHORT TERM GOALS: Target date: 6/13  Verbalize awareness of core engagement in exercises Baseline: will progress as appropriate Goal status: INITIAL  2.  Able to navigate stairs into/out of pool with proper, reciprocal  form Baseline: will progress as appropriate Goal status: INITIAL   LONG TERM GOALS: Target date: POC date  BERG to improve by MDC Baseline: see obj Goal status: INITIAL  2.  LEFS to improve by MDC Baseline: see  obj Goal status: INITIAL  3.  Able to stand from standard chair without UEs Baseline: unable at eval Goal status: INITIAL  4.   Active DF to, at least, neutral with knee extended Baseline: see obj Goal status: INITIAL    PLAN:  PT FREQUENCY: 1-2x/week  PT DURATION: POC date  PLANNED INTERVENTIONS: 97164- PT Re-evaluation, 97750- Physical Performance Testing, 97110-Therapeutic exercises, 97530- Therapeutic activity, [id number]- Neuromuscular re-education, 97535- Self Care, 16109- Manual therapy, [phone]- Gait training, [phone]- Aquatic Therapy, Patient/Family education, Balance training, Stair training, Taping, Dry Needling, Joint mobilization, Spinal mobilization, Cryotherapy, and Moist heat.  PLAN FOR NEXT SESSION: begin aquatics   Alanson Hausmann C. Khaliya Golinski PT, DPT 11/02/23 11:57 AM  For all possible CPT codes, reference the Planned Interventions line above.     Check all conditions that are expected to impact treatment: {Conditions expected to impact treatment:Morbid obesity and Neurological condition and/or seizures   If treatment provided at initial evaluation, no treatment charged due to lack of authorization.

## 2023-11-05 ENCOUNTER — Ambulatory Visit
Admission: RE | Admit: 2023-11-05 | Discharge: 2023-11-05 | Disposition: A | Discharge status: Home / Self Care | Source: Ambulatory Visit | Attending: Physician Assistant | Admitting: Physician Assistant

## 2023-11-05 VITALS — BP 103/72 | HR 91 | Temp 98.0°F | Resp 16

## 2023-11-05 DIAGNOSIS — G5793 Unspecified mononeuropathy of bilateral lower limbs: Secondary | ICD-10-CM | POA: Diagnosis not present

## 2023-11-05 DIAGNOSIS — J069 Acute upper respiratory infection, unspecified: Secondary | ICD-10-CM | POA: Diagnosis not present

## 2023-11-05 LAB — POC SARS CORONAVIRUS 2 AG -  ED: SARS Coronavirus 2 Ag: NEGATIVE

## 2023-11-05 NOTE — ED Provider Notes (Signed)
 [facility]    CSN: [id number] Arrival date & time: 11/05/23  0848      History   Chief Complaint Chief Complaint  Patient presents with   Cough    Entered by patient    HPI Karen Brennan is a 48 y.o. female.   Patient here today for evaluation of cough that she has had the last few days.  She has not had fever.  She does report cough is productive of yellow phlegm.  She denies any nausea, vomiting or diarrhea.  She has taken over-the-counter medication with mild relief.  The history is provided by the patient.  Cough Associated symptoms: sore throat   Associated symptoms: no chills, no ear pain, no eye discharge, no fever, no shortness of breath and no wheezing     Past Medical History:  Diagnosis Date   ANEMIA 08/14/2008   Qualifier: Diagnosis of  By: Chanetta Comes MD, Jessica     Anxiety    Asthma    B12 deficiency 08/29/2021   Bipolar 1 disorder (HCC)    Blood transfusion    Cellulitis 07/15/2020   Of right buttock   Chest pain    Depression    Edema of both lower extremities    GERD (gastroesophageal reflux disease)    History of bronchitis    Hypertension    Kidney stones 10/09/2011   Migraines 10/09/2011   "often"   Morbid obesity with BMI of 50.0-59.9, adult (HCC) 08/09/2006   Qualifier: Diagnosis of   By: Chanetta Comes MD, Jessica         Pre-diabetes    Schizophrenia (HCC)    SOB (shortness of breath)    Vitamin D  deficiency 08/29/2021    Patient Active Problem List   Diagnosis Date Noted   Severe dysplasia of cervix (CIN III) 08/20/2023   Open wound of foot, right, initial encounter 06/25/2023   Plantar fasciitis 06/25/2023   BMI 45.0-49.9, adult (HCC)-current bmi 46.8 07/12/2022   Chronic constipation 06/14/2022   Seasonal allergies 05/01/2022   Hyperlipidemia associated with type 2 diabetes mellitus (HCC) 12/05/2021   Type II diabetes mellitus (HCC) 08/29/2021   PVC's (premature ventricular contractions) 07/19/2021   Intertrigo  02/16/2020   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   Neuropathic pain of both feet 05/09/2016   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   Anxiety 10/25/2011   Urinary tract infection with hematuria 10/12/2011   Bipolar 1 disorder, mixed (HCC) 09/15/2011   Allergic rhinitis 08/31/2008   Esophageal reflux 01/24/2008   Hypertension associated with diabetes (HCC) 08/17/2006    Past Surgical History:  Procedure Laterality Date   ANKLE FRACTURE SURGERY   1990's   "left; put screws in"   ENDOMETRIAL ABLATION  ~ 2011   FRACTURE SURGERY     GASTRIC BY PASS     TUBAL LIGATION  2006    OB History     Gravida  5   Para  4   Term  4   Preterm      AB  1   Living  4      SAB      IAB      Ectopic  1   Multiple      Live Births               Home Medications    Prior to Admission medications   Medication Sig Start Date End Date Taking? Authorizing Provider  Accu-Chek Softclix Lancets lancets USE  TO test fasting blood sugar EVERY DAY IN THE MORNING 10/11/23  Yes Everhart, Kirstie, DO  albuterol  (VENTOLIN  HFA) 108 (90 Base) MCG/ACT inhaler inhale 2 puffs BY MOUTH EVERY 4 HOURS AS NEEDED FOR wheezing OR SHORTNESS OF BREATH 10/11/23   Everhart, Kirstie, DO  ARIPiprazole  (ABILIFY ) 20 MG tablet Take 20 mg by mouth at bedtime as needed.   Yes [provider]  atorvastatin  (LIPITOR) 20 MG tablet Take 1 tablet (20 mg total) by mouth daily. 10/11/23  Yes Everhart, Kirstie, DO  B Complex-C-Folic Acid  (B COMPLEX-VITAMIN C-FOLIC ACID ) 1 MG tablet Take 1 tablet by mouth daily with breakfast. 09/19/23  Yes Opalski, Deborah, DO  benztropine (COGENTIN) 1 MG tablet Take 1 mg by mouth 2 (two) times daily. 06/18/19  Yes [provider]  blood glucose meter kit and supplies KIT Dispense based on patient and insurance preference. Use up to four times daily as directed. (FOR ICD-9 250.00, 250.01). 10/11/23  Yes Everhart, Kirstie, DO  Blood Glucose  Monitoring Suppl (ACCU-CHEK GUIDE) w/Device KIT Use to check blood sugar 3x per day. E11.9 10/12/23  Yes Everhart, Kirstie, DO  busPIRone (BUSPAR) 7.5 MG tablet Take 7.5 mg by mouth 2 (two) times daily. 11/15/21  Yes [provider]  Cyanocobalamin  1000 MCG CAPS Take 1 tablet by mouth daily. 04/11/23  Yes [provider]  ferrous sulfate  325 (65 FE) MG tablet Take 325 mg by mouth in the morning and at bedtime. 08/25/21  Yes [provider]  FLUoxetine (PROZAC) 40 MG capsule Take 40 mg by mouth daily.   Yes [provider]  gabapentin  (NEURONTIN ) 300 MG capsule 300 in AM and midday then 600 at night 10/05/23  Yes Maxwell, Allee, MD  glucose blood (ACCU-CHEK GUIDE TEST) test strip Use to check blood sugar 3x per day. E11.9 10/12/23  Yes Everhart, Kirstie, DO  haloperidol (HALDOL) 2 MG tablet Take 2 mg by mouth daily.   Yes [provider]  hydrOXYzine  (VISTARIL ) 25 MG capsule Take 25 mg by mouth every evening. Every evening as needed 10/24/20  Yes [provider]  linaclotide  (LINZESS ) 72 MCG capsule Take 1 capsule (72 mcg total) by mouth daily before breakfast. 09/19/23  Yes Opalski, Bernardo Bridgeman, DO  meclizine  (ANTIVERT ) 12.5 MG tablet Take 1 tablet (12.5 mg total) by mouth 3 (three) times daily as needed for dizziness. 07/24/23   Ernestina Headland, MD  metFORMIN  (GLUCOPHAGE ) 500 MG tablet Take 1 tablet (500 mg total) by mouth 2 (two) times daily with a meal. 09/19/23  Yes Opalski, Deborah, DO  metoprolol  tartrate (LOPRESSOR ) 25 MG tablet TAKE ONE-HALF TABLET BY MOUTH TWICE DAILY AS NEEDED 08/08/23  Yes Wittenborn, Bernardo Bridgeman, NP  nystatin  (MYCOSTATIN /NYSTOP ) powder Apply 1 Application topically 3 (three) times daily. 07/06/23  Yes Hindel, Leah, MD  OLANZapine (ZYPREXA) 20 MG tablet Take 20 mg by mouth at bedtime.   Yes [provider]  ondansetron  (ZOFRAN ) 4 MG tablet Take 1 tablet (4 mg total) by mouth every 8 (eight) hours as needed for nausea or vomiting.  10/25/23   Everhart, Kirstie, DO  Prenatal Vit-Fe Fum-FA-Omega (PNV PRENATAL PLUS MULTIVIT+DHA) 27-1 & 312 MG MISC 1 po qd 09/19/23  Yes Opalski, Deborah, DO  Probiotic Product (ACIDOPHILUS PROBIOTIC BLEND PO) Take by mouth.   Yes [provider]  Semaglutide , 2 MG/DOSE, 8 MG/3ML SOPN Inject 2 mg as directed once a week. 09/19/23  Yes Opalski, Bernardo Bridgeman, DO  traZODone (DESYREL) 50 MG tablet Take 50 mg by mouth at bedtime as  needed for sleep. 04/07/19  Yes [provider]  valsartan  (DIOVAN ) 40 MG tablet Take 1 tablet (40 mg total) by mouth at bedtime. 10/25/23  Yes Everhart, Kirstie, DO  Vitamin D , Ergocalciferol , (DRISDOL ) 1.25 MG (50000 UNIT) CAPS capsule 1 tab q wed and 1 tab q sun 08/27/23  Yes Opalski, Bernardo Bridgeman, DO    Family History Family History  Problem Relation Age of Onset   Heart failure Mother    Hyperlipidemia Mother    Hypertension Mother    Diabetes Mother    Sudden death Mother    Depression Mother    Bipolar disorder Mother    Schizophrenia Mother    Hypertension Father    Colon cancer Neg Hx    Esophageal cancer Neg Hx    Rectal cancer Neg Hx    Stomach cancer Neg Hx     Social History Social History   Tobacco Use   Smoking status: Former    Current packs/day: 0.00    Average packs/day: 0.3 packs/day for 4.0 years (1.0 ttl pk-yrs)    Types: Cigarettes    Start date: 06/12/2000    Quit date: 06/12/2004    Years since quitting: 19.4   Smokeless tobacco: Never  Vaping Use   Vaping status: Never Used  Substance Use Topics   Alcohol use: No   Drug use: No     Allergies   Sibutramine hcl monohydrate   Review of Systems Review of Systems  Constitutional:  Negative for chills and fever.  HENT:  Positive for congestion and sore throat. Negative for ear pain.   Eyes:  Negative for discharge and redness.  Respiratory:  Positive for cough. Negative for shortness of breath and wheezing.   Gastrointestinal:  Negative for abdominal pain, diarrhea,  nausea and vomiting.     Physical Exam Triage Vital Signs ED Triage Vitals  Encounter Vitals Group     BP      Systolic BP Percentile      Diastolic BP Percentile      Pulse      Resp      Temp      Temp src      SpO2      Weight      Height      Head Circumference      Peak Flow      Pain Score      Pain Loc      Pain Education      Exclude from Growth Chart    No data found.  Updated Vital Signs BP 103/72 (BP Location: Left Arm)   Pulse 91   Temp 98 F (36.7 C) (Oral)   Resp 16   LMP  (LMP Unknown) Comment: approx 3 months ago  SpO2 99%   Visual Acuity Right Eye Distance:   Left Eye Distance:   Bilateral Distance:    Right Eye Near:   Left Eye Near:    Bilateral Near:     Physical Exam Vitals and nursing note reviewed.  Constitutional:      General: She is not in acute distress.    Appearance: Normal appearance. She is not ill-appearing.  HENT:     Head: Normocephalic and atraumatic.     Nose: Congestion present.     Mouth/Throat:     Mouth: Mucous membranes are moist.     Pharynx: No oropharyngeal exudate or posterior oropharyngeal erythema.  Eyes:     Conjunctiva/sclera: Conjunctivae normal.  Cardiovascular:  Rate and Rhythm: Normal rate and regular rhythm.     Heart sounds: Normal heart sounds. No murmur heard. Pulmonary:     Effort: Pulmonary effort is normal. No respiratory distress.     Breath sounds: Normal breath sounds. No wheezing, rhonchi or rales.  Skin:    General: Skin is warm and dry.  Neurological:     Mental Status: She is alert.  Psychiatric:        Mood and Affect: Mood normal.        Thought Content: Thought content normal.      UC Treatments / Results  Labs (all labs ordered are listed, but only abnormal results are displayed) Labs Reviewed  POC SARS CORONAVIRUS 2 AG -  ED    EKG   Radiology No results found.  Procedures Procedures (including critical care time)  Medications Ordered in UC Medications  - No data to display  Initial Impression / Assessment and Plan / UC Course  I have reviewed the triage vital signs and the nursing notes.  Pertinent labs & imaging results that were available during my care of the patient were reviewed by me and considered in my medical decision making (see chart for details).   COVID screening ordered and was negative.  Recommended symptomatic treatment, increase fluids and rest as I suspect symptoms are related to viral URI.  Encouraged follow-up if no gradual improvement or with any further concerns.   Final Clinical Impressions(s) / UC Diagnoses   Final diagnoses:  Acute upper respiratory infection   Discharge Instructions   None    ED Prescriptions   None    PDMP not reviewed this encounter.   Vernestine Gondola, PA-C 11/05/23 1233

## 2023-11-05 NOTE — ED Triage Notes (Signed)
 Patient presents with a cough x 2 days. Patient is coughing up yellow phlegm.

## 2023-11-07 ENCOUNTER — Other Ambulatory Visit (INDEPENDENT_AMBULATORY_CARE_PROVIDER_SITE_OTHER): Payer: Self-pay | Admitting: Family Medicine

## 2023-11-07 ENCOUNTER — Other Ambulatory Visit: Payer: Self-pay | Admitting: Family Medicine

## 2023-11-07 ENCOUNTER — Other Ambulatory Visit: Payer: Self-pay | Admitting: Vascular Surgery

## 2023-11-07 DIAGNOSIS — R0989 Other specified symptoms and signs involving the circulatory and respiratory systems: Secondary | ICD-10-CM

## 2023-11-07 DIAGNOSIS — J302 Other seasonal allergic rhinitis: Secondary | ICD-10-CM

## 2023-11-07 DIAGNOSIS — E1169 Type 2 diabetes mellitus with other specified complication: Secondary | ICD-10-CM

## 2023-11-07 DIAGNOSIS — G5793 Unspecified mononeuropathy of bilateral lower limbs: Secondary | ICD-10-CM | POA: Diagnosis not present

## 2023-11-09 DIAGNOSIS — G5793 Unspecified mononeuropathy of bilateral lower limbs: Secondary | ICD-10-CM | POA: Diagnosis not present

## 2023-11-12 DIAGNOSIS — G5793 Unspecified mononeuropathy of bilateral lower limbs: Secondary | ICD-10-CM | POA: Diagnosis not present

## 2023-11-14 DIAGNOSIS — G5793 Unspecified mononeuropathy of bilateral lower limbs: Secondary | ICD-10-CM | POA: Diagnosis not present

## 2023-11-15 ENCOUNTER — Other Ambulatory Visit: Payer: Self-pay

## 2023-11-16 DIAGNOSIS — G5793 Unspecified mononeuropathy of bilateral lower limbs: Secondary | ICD-10-CM | POA: Diagnosis not present

## 2023-11-19 ENCOUNTER — Encounter (INDEPENDENT_AMBULATORY_CARE_PROVIDER_SITE_OTHER): Payer: Self-pay | Admitting: Family Medicine

## 2023-11-19 ENCOUNTER — Ambulatory Visit (INDEPENDENT_AMBULATORY_CARE_PROVIDER_SITE_OTHER): Admitting: Family Medicine

## 2023-11-19 VITALS — BP 109/75 | HR 74 | Temp 98.7°F | Ht 73.0 in | Wt 332.0 lb

## 2023-11-19 DIAGNOSIS — D508 Other iron deficiency anemias: Secondary | ICD-10-CM | POA: Diagnosis not present

## 2023-11-19 DIAGNOSIS — K5909 Other constipation: Secondary | ICD-10-CM

## 2023-11-19 DIAGNOSIS — Z6841 Body Mass Index (BMI) 40.0 and over, adult: Secondary | ICD-10-CM

## 2023-11-19 DIAGNOSIS — E1169 Type 2 diabetes mellitus with other specified complication: Secondary | ICD-10-CM

## 2023-11-19 DIAGNOSIS — Z7985 Long-term (current) use of injectable non-insulin antidiabetic drugs: Secondary | ICD-10-CM | POA: Diagnosis not present

## 2023-11-19 DIAGNOSIS — E559 Vitamin D deficiency, unspecified: Secondary | ICD-10-CM | POA: Diagnosis not present

## 2023-11-19 DIAGNOSIS — E1159 Type 2 diabetes mellitus with other circulatory complications: Secondary | ICD-10-CM

## 2023-11-19 DIAGNOSIS — E538 Deficiency of other specified B group vitamins: Secondary | ICD-10-CM | POA: Diagnosis not present

## 2023-11-19 DIAGNOSIS — Z9884 Bariatric surgery status: Secondary | ICD-10-CM | POA: Diagnosis not present

## 2023-11-19 DIAGNOSIS — I152 Hypertension secondary to endocrine disorders: Secondary | ICD-10-CM | POA: Diagnosis not present

## 2023-11-19 DIAGNOSIS — G5793 Unspecified mononeuropathy of bilateral lower limbs: Secondary | ICD-10-CM | POA: Diagnosis not present

## 2023-11-19 MED ORDER — VITAMIN D (ERGOCALCIFEROL) 1.25 MG (50000 UNIT) PO CAPS: ORAL_CAPSULE | ORAL | 1 refills | Status: DC

## 2023-11-19 MED ORDER — PNV PRENATAL PLUS MULTIVIT+DHA 27-1 & 312 MG PO MISC: ORAL | 0 refills | Status: DC

## 2023-11-19 MED ORDER — SEMAGLUTIDE (1 MG/DOSE) 4 MG/3ML ~~LOC~~ SOPN: 1.0000 mg | PEN_INJECTOR | SUBCUTANEOUS | 1 refills | Status: DC

## 2023-11-19 MED ORDER — LINACLOTIDE 72 MCG PO CAPS
72.0000 ug | ORAL_CAPSULE | Freq: Every day | ORAL | 0 refills | Status: DC
Start: 2023-11-19 — End: 2024-02-06

## 2023-11-19 NOTE — Progress Notes (Signed)
 [name], D.O.  ABFM, ABOM Specializing in Clinical Bariatric Medicine  Office located at: [address] [place], [place]  [address]   Assessment and Plan:   Medications Discontinued During This Encounter  Medication Reason   Semaglutide , 2 MG/DOSE, 8 MG/3ML SOPN    Vitamin D , Ergocalciferol , (DRISDOL ) 1.25 MG (50000 UNIT) CAPS capsule Reorder   linaclotide  (LINZESS ) 72 MCG capsule Reorder   Prenatal Vit-Fe Fum-FA-Omega (PNV PRENATAL PLUS MULTIVIT+DHA) 27-1 & 312 MG MISC Reorder     Meds ordered this encounter  Medications   linaclotide  (LINZESS ) 72 MCG capsule    Sig: Take 1 capsule (72 mcg total) by mouth daily before breakfast.    Dispense:  90 capsule    Refill:  0   Vitamin D , Ergocalciferol , (DRISDOL ) 1.25 MG (50000 UNIT) CAPS capsule    Sig: 1 tab q wed and 1 tab q sun    Dispense:  8 capsule    Refill:  1    ** OV for RF **   Do not send RF request   Semaglutide , 1 MG/DOSE, 4 MG/3ML SOPN    Sig: Inject 1 mg as directed once a week.    Dispense:  3 mL    Refill:  1   Prenatal Vit-Fe Fum-FA-Omega (PNV PRENATAL PLUS MULTIVIT+DHA) 27-1 & 312 MG MISC    Sig: 1 po qd    Dispense:  90 each    Refill:  0    FOR THE DISEASE OF OBESITY:  BMI 40.0-44.9, adult (HCC)- current BMI 43.81 Morbid obesity (HCC)-starting bmi 58.45/date 02/01/21 Assessment & Plan: Since last office visit on 09/19/2023 patient's  Muscle mass has decreased by 4 lb. Fat mass has increased by 16.8 lb. Total body water not registered on scale. Counseling done on how various foods will affect these numbers and how to maximize success  Total lbs lost to date: 112 lbs  Total weight loss percentage to date: 25.23%    Recommended Dietary Goals Kele is currently in the action stage of change. As such, her goal is to continue weight management plan.  She has agreed to: continue current plan   Behavioral Intervention We discussed the following today: continue to work on maintaining a  reduced calorie state, getting the recommended amount of protein, incorporating whole foods, making healthy choices, staying well hydrated and practicing mindfulness when eating.  Additional resources provided today: Handout on CAT 3 meal plan, Handout on CAT 3-4 breakfast options, and Handout on CAT 3-4 lunch options  Evidence-based interventions for health behavior change were utilized today including the discussion of self monitoring techniques, problem-solving barriers and SMART goal setting techniques.   Regarding patient's less desirable eating habits and patterns, we employed the technique of small changes.   Pt will specifically work on: n/a   Recommended Physical Activity Goals Mychaela has been advised to work up to 300-450 minutes of moderate intensity aerobic activity a week and strengthening exercises 2-3 times per week for cardiovascular health, weight loss maintenance and preservation of muscle mass.   She has agreed to :  Think about enjoyable ways to increase daily physical activity and overcoming barriers to exercise   Pharmacotherapy We both agreed to continue current regimen.    ASSOCIATED CONDITIONS ADDRESSED TODAY:  Type 2 diabetes mellitus with obesity [facility]) Assessment & Plan: Lab Results  Component Value Date   HGBA1C 5.5 08/08/2023   HGBA1C 5.5 04/11/2023   HGBA1C 5.5 10/02/2022   INSULIN  11.0 01/12/2022  INSULIN  22.7 02/01/2021    LOV on 09/19/2023, I increased her Semaglutide  to 2 mg wkly. On the 2 mg dose she experienced nausea, burping, and gastric discomfort and decreased it back to one 1 mg. She is tolerating the 1 mg dose well.   Encouraged her to decrease her Ozempic  back to 1 mg which she has already done. Continue balanced diet focusing on protein, fruits, and vegetables while limiting simple carbohydrates.    Hypertension associated with type 2 diabetes mellitus (HCC) Assessment & Plan: Last 3 blood pressure readings in our office are as  follows: BP Readings from Last 3 Encounters:  11/19/23 109/75  11/05/23 103/72  10/25/23 112/64   The ASCVD Risk score (Arnett DK, et al., 2019) failed to calculate for the following reasons:   The valid total cholesterol range is 130 to 320 mg/dL  Lab Results  Component Value Date   CREATININE 0.66 10/25/2023   Relevant medications: Metoprolol  25 mg and Valsartan  40 mg daily.  Pt met with her PCP in May because she had been experiencing syncope thought to be due to orthostatic hypotension for several months since the beginning of the year. They discontinued her hydrochlorothiazide  and later decreased her Valsartan  from 80 to 40 mg. BP at goal today. Pt asx and has no acute concerns.   Continue regimen and f/up with PCP. If symptoms return consider weaning off Metoprolol .    Other iron deficiency anemia Assessment & Plan: Lab Results  Component Value Date   WBC 8.7 07/24/2023   HGB 13.3 07/24/2023   HCT 40.6 07/24/2023   MCV 89 07/24/2023   PLT 357 07/24/2023   Lab Results  Component Value Date   IRON 55 10/25/2023   TIBC 407 08/08/2023   FERRITIN 52 08/08/2023   Doing well on daily Prenatal Vit-Fe Fum-FA-Omega supplement. Continue regimen.    S/P gastric bypass/ B12 deficiency Assessment & Plan: Doing well on daily B complex-C-Folic Acid  supplement. Continue regimen.    Other constipation Assessment & Plan: Has had to use her Linzess  daily because of increased constipation d/t increased stress. Continue regimen. Encouraged exercise and adequate hydration.    Vitamin D  deficiency Assessment & Plan: Lab Results  Component Value Date   VD25OH 52.0 10/25/2023   VD25OH 42.2 08/08/2023   VD25OH 34.9 04/11/2023   Doing well on ergocalciferol  50,000 units twice wkly. Continue regimen. Recheck; future.     Follow up:   Return 12/19/2023 at 10:00 AM. She was informed of the importance of frequent follow up visits to maximize her success with intensive lifestyle  modifications for her multiple health conditions.  Subjective:   Chief complaint: Obesity Dorene is here to discuss her progress with her obesity treatment plan. She is on the Category 3 Plan and states she is following her eating plan approximately 10% of the time. She states she is not exercising.  Interval History:  LETIA GUIDRY is here for a follow up office visit. Since last OV on 09/19/2023, Ms.Deramo is up 12 lbs. She has been very stressed d/t her eldest daughters acute change in mental status and behavior. B/c of the stress, she has not been exercising and endorses sub-optimal adherence to her meal plan.   Pharmacotherapy that aid with weight loss: She is currently taking Ozempic  1 mg wkly and Metformin  500 mg two times daily.  Review of Systems:  Pertinent positives were addressed with patient today.  Reviewed by clinician on day of visit: allergies, medications, problem list, medical history,  surgical history, family history, social history, and previous encounter notes.  Weight Summary and Biometrics   Weight Lost Since Last Visit: 0lb  Weight Gained Since Last Visit: 12lb  Vitals Temp: 98.7 F (37.1 C) BP: 109/75 Pulse Rate: 74 SpO2: 100 %   Anthropometric Measurements Height: 6' 1 (1.854 m) Weight: (!) 332 lb (150.6 kg) BMI (Calculated): 43.81 Weight at Last Visit: 320lb Weight Lost Since Last Visit: 0lb Weight Gained Since Last Visit: 12lb Starting Weight: 443lb Total Weight Loss (lbs): 112 lb (50.8 kg)   Body Composition  Body Fat %: 55.6 % Fat Mass (lbs): 184.8 lbs Muscle Mass (lbs): 140 lbs Visceral Fat Rating : 17   Other Clinical Data Fasting: No Labs: no Today's Visit #: 40 Starting Date: 02/01/21    Objective:   PHYSICAL EXAM: Blood pressure 109/75, pulse 74, temperature 98.7 F (37.1 C), height 6' 1 (1.854 m), weight (!) 332 lb (150.6 kg), SpO2 100%. Body mass index is 43.8 kg/m.  General: she is overweight, cooperative and in  no acute distress. PSYCH: Has normal mood, affect and thought process.   HEENT: EOMI, sclerae are anicteric. Lungs: Normal breathing effort, no conversational dyspnea. Extremities: Moves * 4 Neurologic: A and O * 3, good insight  DIAGNOSTIC DATA REVIEWED: BMET    Component Value Date/Time   NA 142 10/25/2023 1413   K 3.9 10/25/2023 1413   CL 104 10/25/2023 1413   CO2 24 10/25/2023 1413   GLUCOSE 89 10/25/2023 1413   GLUCOSE 208 (H) 07/04/2022 1037   BUN 13 10/25/2023 1413   CREATININE 0.66 10/25/2023 1413   CREATININE 0.82 07/05/2016 0942   CALCIUM  8.9 10/25/2023 1413   GFRNONAA >60 07/04/2022 1037   GFRNONAA >89 07/05/2016 0942   GFRAA 99 02/12/2020 1423   GFRAA >89 07/05/2016 0942   Lab Results  Component Value Date   HGBA1C 5.5 08/08/2023   HGBA1C 5.7 09/15/2011   Lab Results  Component Value Date   INSULIN  11.0 01/12/2022   INSULIN  22.7 02/01/2021   Lab Results  Component Value Date   TSH 1.150 04/11/2023   CBC    Component Value Date/Time   WBC 8.7 07/24/2023 1112   WBC 10.4 07/04/2022 1037   RBC 4.54 07/24/2023 1112   RBC 4.76 07/04/2022 1037   HGB 13.3 07/24/2023 1112   HCT 40.6 07/24/2023 1112   PLT 357 07/24/2023 1112   MCV 89 07/24/2023 1112   MCH 29.3 07/24/2023 1112   MCH 29.6 07/04/2022 1037   MCHC 32.8 07/24/2023 1112   MCHC 32.5 07/04/2022 1037   RDW 11.8 07/24/2023 1112   Iron Studies    Component Value Date/Time   IRON 55 10/25/2023 1413   TIBC 407 08/08/2023 1029   FERRITIN 52 08/08/2023 1029   IRONPCTSAT 25 08/08/2023 1029   Lipid Panel     Component Value Date/Time   CHOL 126 02/16/2023 1605   TRIG 62 02/16/2023 1605   HDL 57 02/16/2023 1605   CHOLHDL 2.2 02/16/2023 1605   CHOLHDL 3.8 12/09/2014 1102   VLDL 25 12/09/2014 1102   LDLCALC 56 02/16/2023 1605   Hepatic Function Panel     Component Value Date/Time   PROT 6.3 04/11/2023 1050   ALBUMIN 4.1 04/11/2023 1050   AST 20 04/11/2023 1050   ALT 16 04/11/2023 1050    ALKPHOS 56 04/11/2023 1050   BILITOT 1.1 04/11/2023 1050   BILIDIR 0.1 02/02/2009 2254   IBILI 0.6 02/02/2009 2254  Component Value Date/Time   TSH 1.150 04/11/2023 1050   Nutritional Lab Results  Component Value Date   VD25OH 52.0 10/25/2023   VD25OH 42.2 08/08/2023   VD25OH 34.9 04/11/2023    Attestations:   I, Special Puri, acting as a Stage manager for [employer], DO., have compiled all relevant documentation for today's office visit on behalf of Marceil Sensor, DO, while in the presence of [employer], DO.  I have reviewed the above documentation for accuracy and completeness, and I agree with the above. [name], D.O.  The 21st Century Cures Act was signed into law in 2016 which includes the topic of electronic health records.  This provides immediate access to information in MyChart.  This includes consultation notes, operative notes, office notes, lab results and pathology reports.  If you have any questions about what you read please let us  know at your next visit so we can discuss your concerns and take corrective action if need be.  We are right here with you.

## 2023-11-21 DIAGNOSIS — G5793 Unspecified mononeuropathy of bilateral lower limbs: Secondary | ICD-10-CM | POA: Diagnosis not present

## 2023-11-22 ENCOUNTER — Ambulatory Visit: Admitting: Family Medicine

## 2023-11-23 ENCOUNTER — Encounter (HOSPITAL_BASED_OUTPATIENT_CLINIC_OR_DEPARTMENT_OTHER): Payer: Self-pay | Admitting: Physical Therapy

## 2023-11-23 ENCOUNTER — Ambulatory Visit (HOSPITAL_BASED_OUTPATIENT_CLINIC_OR_DEPARTMENT_OTHER): Payer: Self-pay | Attending: Family Medicine | Admitting: Physical Therapy

## 2023-11-23 DIAGNOSIS — M19172 Post-traumatic osteoarthritis, left ankle and foot: Secondary | ICD-10-CM | POA: Insufficient documentation

## 2023-11-23 DIAGNOSIS — M25372 Other instability, left ankle: Secondary | ICD-10-CM | POA: Diagnosis present

## 2023-11-23 DIAGNOSIS — G5793 Unspecified mononeuropathy of bilateral lower limbs: Secondary | ICD-10-CM | POA: Diagnosis not present

## 2023-11-23 NOTE — Therapy (Signed)
 OUTPATIENT PHYSICAL THERAPY TREATMENT   Patient Name: Karen Brennan MRN: [id number] DOB:[birth year], 48 y.o., female Today's Date: 11/23/2023  END OF SESSION:  PT End of Session - 11/23/23 0845     Visit Number 2    Number of Visits 13    Date for PT Re-Evaluation 12/14/23    Authorization Type [employer] MCD    PT Start Time 0845    PT Stop Time 0923    PT Time Calculation (min) 38 min    Activity Tolerance Patient tolerated treatment well    Behavior During Therapy [facility] for tasks assessed/performed          Past Medical History:  Diagnosis Date   ANEMIA 08/14/2008   Qualifier: Diagnosis of  By: Chanetta Comes MD, Jessica     Anxiety    Asthma    B12 deficiency 08/29/2021   Bipolar 1 disorder (HCC)    Blood transfusion    Cellulitis 07/15/2020   Of right buttock   Chest pain    Depression    Edema of both lower extremities    GERD (gastroesophageal reflux disease)    History of bronchitis    Hypertension    Kidney stones 10/09/2011   Migraines 10/09/2011   often   Morbid obesity with BMI of 50.0-59.9, adult (HCC) 08/09/2006   Qualifier: Diagnosis of   By: Chanetta Comes MD, Jessica         Pre-diabetes    Schizophrenia (HCC)    SOB (shortness of breath)    Vitamin D  deficiency 08/29/2021   Past Surgical History:  Procedure Laterality Date   ANKLE FRACTURE SURGERY   1990's   left; put screws in   ENDOMETRIAL ABLATION  ~ 2011   FRACTURE SURGERY     GASTRIC BY PASS     TUBAL LIGATION  2006   Patient Active Problem List   Diagnosis Date Noted   Severe dysplasia of cervix (CIN III) 08/20/2023   Open wound of foot, right, initial encounter 06/25/2023   Plantar fasciitis 06/25/2023   BMI 45.0-49.9, adult (HCC)-current bmi 46.8 07/12/2022   Chronic constipation 06/14/2022   Seasonal allergies 05/01/2022   Hyperlipidemia associated with type 2 diabetes mellitus (HCC) 12/05/2021   Type II diabetes mellitus (HCC) 08/29/2021   PVC's (premature ventricular contractions)  07/19/2021   Intertrigo 02/16/2020   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 Include strengthening exercises and aquatic therapy in treatment plan.   Rationale for Evaluation and Treatment: Rehabilitation  THERAPY DIAG:  Instability of left ankle joint  Post-traumatic osteoarthritis of left ankle  ONSET DATE: 2-3 mo ago (around Feb 2025) acute on chronic   SUBJECTIVE:  SUBJECTIVE STATEMENT: Pt reports no new changes since evaluation.  She is not afraid of the water, but does not know how to swim.   POOL ACCESS:  [employer]  From initial evaluation: Broke my ankle when I was a teenager, hardware removed in the past. It swells really bad. I usually wear compression socks and ASO with tennis shoes. Sometimes limited in shoe wear by ankle edema. Two falls in the last 2 months due to dizziness, blacked out and fell on knees. On BP meds- changing due to recent weight loss.   PERTINENT HISTORY:  Peripheral neuropathy- reports occasional sensation of pressure of floor on foot, gabapentin  for management.   PAIN:  Are you having pain? Yes: NPRS scale: 5-6/10 Pain location: L ankle, both knees, lower back Pain description: tight,  tender Aggravating factors: standing too long Relieving factors: ibuprofen , tylenol   PRECAUTIONS:  Fall  RED FLAGS: None   WEIGHT BEARING RESTRICTIONS:  No  FALLS:  Has patient fallen in last 6 months? Yes. Number of falls 5  LIVING ENVIRONMENT: Lives with: lives with their daughter x3 Lives in: House/apartment- no stairs  OCCUPATION:  On disability  PLOF:  Sometimes needs help with bathing and getting off the couch  PATIENT GOALS:  Working on weight loss   OBJECTIVE:  Note: Objective measures were completed at Evaluation unless otherwise noted.  DIAGNOSTIC FINDINGS:  WFL  PATIENT SURVEYS:  LEFS  Extreme difficulty/unable (0), Quite a bit of difficulty (1), Moderate difficulty (2), Little difficulty (3), No difficulty (4) Survey date:  5/23  Any of your usual work, housework or school activities 1  2. Usual hobbies, recreational or sporting activities 0  3. Getting into/out of the bath 0  4. Walking between rooms 1  5. Putting on socks/shoes 1  6. Squatting  0  7. Lifting an object, like a bag of groceries from the floor 1  8. Performing light activities around your home 1  9. Performing heavy activities around your home 0  10. Getting into/out of a car 0  11. Walking 2 blocks 0  12. Walking 1 mile 0  13. Going up/down 10 stairs (1 flight) 0  14. Standing for 1 hour 0  15.  sitting for 1 hour 0  16. Running on even ground 0  17. Running on uneven ground 0  18. Making sharp turns while running fast 0  19. Hopping  0  20. Rolling over in bed 1  Score total:  6     COGNITIVE STATUS: Within functional limits for tasks assessed   SENSATION: Limited due to peripheral neuropathy in bilat feet  EDEMA:  Yes: Lt lateral maleolus   GAIT: Eval: flexed posture, lacking DF at heel strike bilat, antalgic, slow cadence.    Body Part #1 Ankle  EVAL ankle AROM DF: knee flexed-3, knee ext -10  Able to stand with DF at -3 deg, unable to demo standing toe  raise  FUNCTIONAL TESTS:  Berg Balance Scale:  Item Test date: 5/23EVAL Test date:  Test date:   Sitting to standing 1. needs minimal aid to stand or stabilize Insert OPRCBERGREEVAL SmartPhrase at re-test date Insert OPRCBERGREEVAL SmartPhrase at re-test date  2. Standing unsupported 4. able to stand safely for 2 minutes    3. Sitting with back unsupported, feet supported 4. able to sit safely and securely for 2 minutes    4. Standing to sitting 1. sits independently but has uncontrolled descent    5. Pivot transfer  3. able to transfer safely with definite need of hands    6. Standing unsupported with eyes closed 4. able to stand 10 seconds safely    7. Standing unsupported with feet together 4. able to place feet together independently and stand 1 minute safely    8. Reaching forward with outstretched arms while standing 3. can reach forward 12 cm (5 inches)    9. Pick up object from the floor from standing 3. able to pick up slipper but needs supervision    10. Turning to look behind over left and right shoulders while standing 3. looks behind one side only, other side shows less weight shift    11. Turn 360 degrees 2. able to turn 360 degrees safely but slowly    12. Place alternate foot on step or stool while standing unsupported 2. able to complete 4 steps without aid with supervision    13. Standing unsupported one foot in front 3. able to place foot ahead independently and hold 30 seconds    14. Standing on one leg 1. tries to lift leg unable to hold 3 seconds but remains standing independently.      Total Score 38/56 Total Score /56 Total Score /56                                                                                                                                 TREATMENT DATE:  [facility] Adult PT Treatment:                                             11/23/23 Pt seen for aquatic therapy today.  Treatment took place in water 3.5-4.75 ft in depth at the [employer] [facility]  pool. Temp of water was 91.  Pt entered/exited the pool via stairs independently in step-to pattern with bilat rail.  - Intro to aquatic therapy principles - unsupported walking forward/ backward x 3 laps (in deepest water) - cues for heel/toe and toe/heel - unsupport side stepping -> with arm addct/ abdct with rainbow hand floats  - suitcase carry marching forward/ backward with bilat rainbow hand floats at side (increased knee pain) - UE on wall:  toe/heel raises x 10; hip add/abd x10 ; hamstring curls x 5 each LE - return to walking with cues for even step length - tandem gait forward/ backward, intermittent UE on wall-> UE on barbell - TrA set with solid pull down to thighs-> rainbow hand floats  * once dried off applied reg rock tape to L ankle in 2 stirrups, one crossing at talus to provide increased proprioception. Pt instructed in safe tape removal technique; verbalized understanding.   Pt requires the buoyancy and hydrostatic pressure of water for support, and to offload joints by unweighting joint load by at least 50 % in navel deep  water and by at least 75-80% in chest to neck deep water.  Viscosity of the water is needed for resistance of strengthening. Water current perturbations provides challenge to standing balance requiring increased core activation.      PATIENT EDUCATION:  Education details: intro to aquatic therapy  Person educated: Patient Education method: Explanation, Demonstration, Tactile cues, and Verbal cues Education comprehension: verbalized understanding, returned demonstration, verbal cues required, tactile cues required, and needs further education  HOME EXERCISE PROGRAM: To be established at pool   ASSESSMENT:  CLINICAL IMPRESSION: Pt demonstrates safety and independence in aquatic setting with therapist instructing from deck. Pt demonstrates confidence in setting, moving throughout all depths easily. She is 6'1 so she works well in the deepest part of  therapy pool.  She requires cues for more upright posture throughout (vs forward flexed). Pt reported reduction of pain in L ankle to 4/10 at end of session, but increase in lower back pain to 6/10. Encouraged pt to trial compression sock on L ankle to assist with edema control.   Goals are ongoing.    From initial evaluation:  Patient is a 48 y.o. F who was seen today for physical therapy evaluation and treatment for Lt ankle pain- subacute on chronic onset. Pt has recently lost 130lb and has very limited central stability leading to distal pain and lack of control. Able to ambulate but lacks DF which puts her at a trip risk. Pt will benefit significantly from aquatic therapy for gross biomechanical chain strengthening and endurance challenge. I did introduce her to aquatics today and walked her back to the pool.     OBJECTIVE IMPAIRMENTS: Abnormal gait, decreased activity tolerance, decreased balance, decreased coordination, decreased mobility, difficulty walking, decreased ROM, decreased strength, dizziness, increased edema, impaired flexibility, impaired sensation, improper body mechanics, obesity, and pain.   ACTIVITY LIMITATIONS: standing, squatting, stairs, transfers, bed mobility, bathing, toileting, hygiene/grooming, and locomotion level  PARTICIPATION LIMITATIONS: cleaning, laundry, shopping, and community activity  PERSONAL FACTORS: obesity & weight changes, blood pressure changes/medication management, peripheral neuropathy, recent falls are also affecting patient's functional outcome.   REHAB POTENTIAL: Good  CLINICAL DECISION MAKING: Unstable/unpredictable  EVALUATION COMPLEXITY: High   GOALS: Goals reviewed with patient? Yes  SHORT TERM GOALS: Target date: 6/13  Verbalize awareness of core engagement in exercises Baseline: will progress as appropriate Goal status: INITIAL  2.  Able to navigate stairs into/out of pool with proper, reciprocal form Baseline: will progress  as appropriate Goal status: IN PROGRESS- 11/23/23  (first session, step-to pattern)   LONG TERM GOALS: Target date: POC date  BERG to improve by MDC Baseline: see obj Goal status: INITIAL  2.  LEFS to improve by MDC Baseline: see obj Goal status: INITIAL  3.  Able to stand from standard chair without UEs Baseline: unable at eval Goal status: INITIAL  4.   Active DF to, at least, neutral with knee extended Baseline: see obj Goal status: INITIAL    PLAN:  PT FREQUENCY: 1-2x/week  PT DURATION: POC date  PLANNED INTERVENTIONS: 97164- PT Re-evaluation, 97750- Physical Performance Testing, 97110-Therapeutic exercises, 97530- Therapeutic activity, [id number]- Neuromuscular re-education, 97535- Self Care, 86578- Manual therapy, [phone]- Gait training, [phone]- Aquatic Therapy, Patient/Family education, Balance training, Stair training, Taping, Dry Needling, Joint mobilization, Spinal mobilization, Cryotherapy, and Moist heat.  PLAN FOR NEXT SESSION: continue aquatic therapy.     Almedia Jacobsen, PTA 11/23/23 11:25 AM [facility] [address] [place], [place], [address] Phone: [phone]  Fax:  [phone]    For all possible CPT codes, reference the Planned Interventions line above.     Check all conditions that are expected to impact treatment: {Conditions expected to impact treatment:Morbid obesity and Neurological condition and/or seizures   If treatment provided at initial evaluation, no treatment charged due to lack of authorization.

## 2023-11-26 ENCOUNTER — Encounter (HOSPITAL_BASED_OUTPATIENT_CLINIC_OR_DEPARTMENT_OTHER): Payer: Self-pay | Admitting: Physical Therapy

## 2023-11-26 ENCOUNTER — Ambulatory Visit (HOSPITAL_BASED_OUTPATIENT_CLINIC_OR_DEPARTMENT_OTHER): Admitting: Physical Therapy

## 2023-11-26 DIAGNOSIS — M25372 Other instability, left ankle: Secondary | ICD-10-CM

## 2023-11-26 DIAGNOSIS — M19172 Post-traumatic osteoarthritis, left ankle and foot: Secondary | ICD-10-CM

## 2023-11-26 DIAGNOSIS — G5793 Unspecified mononeuropathy of bilateral lower limbs: Secondary | ICD-10-CM | POA: Diagnosis not present

## 2023-11-26 NOTE — Therapy (Signed)
 OUTPATIENT PHYSICAL THERAPY TREATMENT   Patient Name: Karen Brennan MRN: [id number] DOB:03/07/1976, 48 y.o., female Today's Date: 11/26/2023  END OF SESSION:  PT End of Session - 11/26/23 0849     Visit Number 3    Number of Visits 13    Date for PT Re-Evaluation 12/14/23    Authorization Type [employer] MCD    PT Start Time 0847    PT Stop Time 0926    PT Time Calculation (min) 39 min    Activity Tolerance Patient tolerated treatment well    Behavior During Therapy [facility] for tasks assessed/performed          Past Medical History:  Diagnosis Date   ANEMIA 08/14/2008   Qualifier: Diagnosis of  By: Chanetta Comes MD, Jessica     Anxiety    Asthma    B12 deficiency 08/29/2021   Bipolar 1 disorder (HCC)    Blood transfusion    Cellulitis 07/15/2020   Of right buttock   Chest pain    Depression    Edema of both lower extremities    GERD (gastroesophageal reflux disease)    History of bronchitis    Hypertension    Kidney stones 10/09/2011   Migraines 10/09/2011   often   Morbid obesity with BMI of 50.0-59.9, adult (HCC) 08/09/2006   Qualifier: Diagnosis of   By: Chanetta Comes MD, Jessica         Pre-diabetes    Schizophrenia (HCC)    SOB (shortness of breath)    Vitamin D  deficiency 08/29/2021   Past Surgical History:  Procedure Laterality Date   ANKLE FRACTURE SURGERY   1990's   left; put screws in   ENDOMETRIAL ABLATION  ~ 2011   FRACTURE SURGERY     GASTRIC BY PASS     TUBAL LIGATION  2006   Patient Active Problem List   Diagnosis Date Noted   Severe dysplasia of cervix (CIN III) 08/20/2023   Open wound of foot, right, initial encounter 06/25/2023   Plantar fasciitis 06/25/2023   BMI 45.0-49.9, adult (HCC)-current bmi 46.8 07/12/2022   Chronic constipation 06/14/2022   Seasonal allergies 05/01/2022   Hyperlipidemia associated with type 2 diabetes mellitus (HCC) 12/05/2021   Type II diabetes mellitus (HCC) 08/29/2021   PVC's (premature ventricular contractions)  07/19/2021   Intertrigo 02/16/2020   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 Include strengthening exercises and aquatic therapy in treatment plan.   Rationale for Evaluation and Treatment: Rehabilitation  THERAPY DIAG:  Instability of left ankle joint  Post-traumatic osteoarthritis of left ankle  ONSET DATE: 2-3 mo ago (around Feb 2025) acute on chronic   SUBJECTIVE:  SUBJECTIVE STATEMENT: Pt reports tired didn't sleep well.  May have stepped wrong and LB is hurting more  Tape did help some  POOL ACCESS:  [employer]  From initial evaluation: Broke my ankle when I was a teenager, hardware removed in the past. It swells really bad. I usually wear compression socks and ASO with tennis shoes. Sometimes limited in shoe wear by ankle edema. Two falls in the last 2 months due to dizziness, blacked out and fell on knees. On BP meds- changing due to recent weight loss.   PERTINENT HISTORY:  Peripheral neuropathy- reports occasional sensation of pressure of floor on foot, gabapentin  for management.   PAIN:  Are you having pain? Yes: NPRS scale: 5/10 Pain location: L ankle, both knees, lower back Pain description: tight,  tender Aggravating factors: standing too long Relieving factors: ibuprofen , tylenol   PRECAUTIONS:  Fall  RED FLAGS: None   WEIGHT BEARING RESTRICTIONS:  No  FALLS:  Has patient fallen in last 6 months? Yes. Number of falls 5  LIVING ENVIRONMENT: Lives with: lives with their daughter x3 Lives in: House/apartment- no stairs  OCCUPATION:  On disability  PLOF:  Sometimes needs help with bathing and getting off the couch  PATIENT GOALS:  Working on weight loss   OBJECTIVE:  Note: Objective measures were completed at Evaluation unless otherwise noted.  DIAGNOSTIC FINDINGS:  WFL  PATIENT SURVEYS:  LEFS  Extreme difficulty/unable (0), Quite a bit of difficulty (1), Moderate difficulty (2), Little difficulty (3), No difficulty (4) Survey date:  5/23  Any of your usual work, housework or school activities 1  2. Usual hobbies, recreational or sporting activities 0  3. Getting into/out of the bath 0  4. Walking between rooms 1  5. Putting on socks/shoes 1  6. Squatting  0  7. Lifting an object, like a bag of groceries from the floor 1  8. Performing light activities around your home 1  9. Performing heavy activities around your home 0  10. Getting into/out of a car 0  11. Walking 2 blocks 0  12. Walking 1 mile 0  13. Going up/down 10 stairs (1 flight) 0  14. Standing for 1 hour 0  15.  sitting for 1 hour 0  16. Running on even ground 0  17. Running on uneven ground 0  18. Making sharp turns while running fast 0  19. Hopping  0  20. Rolling over in bed 1  Score total:  6     COGNITIVE STATUS: Within functional limits for tasks assessed   SENSATION: Limited due to peripheral neuropathy in bilat feet  EDEMA:  Yes: Lt lateral maleolus   GAIT: Eval: flexed posture, lacking DF at heel strike bilat, antalgic, slow cadence.    Body Part #1 Ankle  EVAL ankle AROM DF: knee flexed-3, knee ext -10  Able to stand with DF at -3 deg, unable to demo standing toe  raise  FUNCTIONAL TESTS:  Berg Balance Scale:  Item Test date: 5/23EVAL Test date:  Test date:   Sitting to standing 1. needs minimal aid to stand or stabilize Insert OPRCBERGREEVAL SmartPhrase at re-test date Insert OPRCBERGREEVAL SmartPhrase at re-test date  2. Standing unsupported 4. able to stand safely for 2 minutes    3. Sitting with back unsupported, feet supported 4. able to sit safely and securely for 2 minutes    4. Standing to sitting 1. sits independently but has uncontrolled descent    5. Pivot transfer  3. able  to transfer safely with definite need of hands    6. Standing unsupported with eyes closed 4. able to stand 10 seconds safely    7. Standing unsupported with feet together 4. able to place feet together independently and stand 1 minute safely    8. Reaching forward with outstretched arms while standing 3. can reach forward 12 cm (5 inches)    9. Pick up object from the floor from standing 3. able to pick up slipper but needs supervision    10. Turning to look behind over left and right shoulders while standing 3. looks behind one side only, other side shows less weight shift    11. Turn 360 degrees 2. able to turn 360 degrees safely but slowly    12. Place alternate foot on step or stool while standing unsupported 2. able to complete 4 steps without aid with supervision    13. Standing unsupported one foot in front 3. able to place foot ahead independently and hold 30 seconds    14. Standing on one leg 1. tries to lift leg unable to hold 3 seconds but remains standing independently.      Total Score 38/56 Total Score /56 Total Score /56                                                                                                                                 TREATMENT DATE:  [facility] Adult PT Treatment:                                             11/26/23 Pt seen for aquatic therapy today.  Treatment took place in water 3.5-4.75 ft in depth at the [facility] [employer]  pool. Temp of water was 91.  Pt entered/exited the pool via stairs independently in step-to pattern with bilat rail.  - UE support barbell walking forward/ backward (in deepest water) - cues for heel/toe and toe/heel - unsupport side stepping -> with arm addct/ abdct with rainbow hand floats  - L stretch; tail wag - UE on wall:  toe/heel raises x 10; hip add/abd x10 ; hamstring curls x 10 each LE; relaxed squats - TrA set with solid noodle pull down to thighs wide stance then staggered - suitcase carry marching forward/ backward with bilat rainbow hand floats at side (no knee pain); then unilaterally. - tandem gait forward/ backward, intermittent UE on wall-> UE on barbell - return to walking with cues for even step length between exercises for recovery  Pt requires the buoyancy and hydrostatic pressure of water for support, and to offload joints by unweighting joint load by at least 50 % in navel deep water and by at least 75-80% in chest to neck deep water.  Viscosity of the water is needed for resistance of strengthening. Water current perturbations provides  challenge to standing balance requiring increased core activation.      PATIENT EDUCATION:  Education details: intro to aquatic therapy  Person educated: Patient Education method: Explanation, Demonstration, Tactile cues, and Verbal cues Education comprehension: verbalized understanding, returned demonstration, verbal cues required, tactile cues required, and needs further education  HOME EXERCISE PROGRAM: To be established at pool   ASSESSMENT:  CLINICAL IMPRESSION: Pt edu on abd bracing fore core engagement and posture improvement.  She is encouraged to maintain at home while completing Adl's as able for added core engagement throughout day improving posture.  She VU. She is cued throughout session to maintain.  Added LB stretching to improve toleration to ankle/foot exercises with good results. Goals ongoing    From  initial evaluation:  Patient is a 48 y.o. F who was seen today for physical therapy evaluation and treatment for Lt ankle pain- subacute on chronic onset. Pt has recently lost 130lb and has very limited central stability leading to distal pain and lack of control. Able to ambulate but lacks DF which puts her at a trip risk. Pt will benefit significantly from aquatic therapy for gross biomechanical chain strengthening and endurance challenge. I did introduce her to aquatics today and walked her back to the pool.     OBJECTIVE IMPAIRMENTS: Abnormal gait, decreased activity tolerance, decreased balance, decreased coordination, decreased mobility, difficulty walking, decreased ROM, decreased strength, dizziness, increased edema, impaired flexibility, impaired sensation, improper body mechanics, obesity, and pain.   ACTIVITY LIMITATIONS: standing, squatting, stairs, transfers, bed mobility, bathing, toileting, hygiene/grooming, and locomotion level  PARTICIPATION LIMITATIONS: cleaning, laundry, shopping, and community activity  PERSONAL FACTORS: obesity & weight changes, blood pressure changes/medication management, peripheral neuropathy, recent falls are also affecting patient's functional outcome.   REHAB POTENTIAL: Good  CLINICAL DECISION MAKING: Unstable/unpredictable  EVALUATION COMPLEXITY: High   GOALS: Goals reviewed with patient? Yes  SHORT TERM GOALS: Target date: 6/13  Verbalize awareness of core engagement in exercises Baseline: will progress as appropriate Goal status: Met 11/26/23  2.  Able to navigate stairs into/out of pool with proper, reciprocal form Baseline: will progress as appropriate Goal status: IN PROGRESS- 11/23/23  (first session, step-to pattern)   LONG TERM GOALS: Target date: POC date  BERG to improve by MDC Baseline: see obj Goal status: INITIAL  2.  LEFS to improve by MDC Baseline: see obj Goal status: INITIAL  3.  Able to stand from standard chair  without UEs Baseline: unable at eval Goal status: INITIAL  4.   Active DF to, at least, neutral with knee extended Baseline: see obj Goal status: INITIAL    PLAN:  PT FREQUENCY: 1-2x/week  PT DURATION: POC date  PLANNED INTERVENTIONS: 97164- PT Re-evaluation, 97750- Physical Performance Testing, 97110-Therapeutic exercises, 97530- Therapeutic activity, [id number]- Neuromuscular re-education, 97535- Self Care, 82956- Manual therapy, [phone]- Gait training, [phone]- Aquatic Therapy, Patient/Family education, Balance training, Stair training, Taping, Dry Needling, Joint mobilization, Spinal mobilization, Cryotherapy, and Moist heat.  PLAN FOR NEXT SESSION: continue aquatic therapy.     Adriana Hopping Hodges) Rozelle Caudle MPT 11/26/23 9:13 AM [facility] [address] [place], [place], [address] Phone: [phone]   Fax:  [phone]     For all possible CPT codes, reference the Planned Interventions line above.     Check all conditions that are expected to impact treatment: {Conditions expected to impact treatment:Morbid obesity and Neurological condition and/or seizures   If treatment provided at initial evaluation, no treatment charged due to lack of authorization.

## 2023-11-26 NOTE — Progress Notes (Signed)
 [facility] [place]  ASSESSMENT / PLAN: Karen Brennan is a 48 y.o. female with normal vascular physical exam and ankle-brachial index. Low concern for peripheral arterial disease. Recommend continued follow up with primary care physician. Follow up with me as needed.  CHIEF COMPLAINT: foot discomfort  HISTORY OF PRESENT ILLNESS: Karen Brennan is a 48 y.o. female with bilateral foot pain. She has been told she has plantar fasciitis and neuropathy. She does not report any symptoms of intermittent claudication, ischemic rest pain, or ulceration.   Past Medical History:  Diagnosis Date   ANEMIA 08/14/2008   Qualifier: Diagnosis of  By: Chanetta Comes MD, Jessica     Anxiety    Asthma    B12 deficiency 08/29/2021   Bipolar 1 disorder (HCC)    Blood transfusion    Cellulitis 07/15/2020   Of right buttock   Chest pain    Depression    Edema of both lower extremities    GERD (gastroesophageal reflux disease)    History of bronchitis    Hypertension    Kidney stones 10/09/2011   Migraines 10/09/2011   often   Morbid obesity with BMI of 50.0-59.9, adult (HCC) 08/09/2006   Qualifier: Diagnosis of   By: Chanetta Comes MD, Jessica         Pre-diabetes    Schizophrenia (HCC)    SOB (shortness of breath)    Vitamin D  deficiency 08/29/2021    Past Surgical History:  Procedure Laterality Date   ANKLE FRACTURE SURGERY   1990's   left; put screws in   ENDOMETRIAL ABLATION  ~ 2011   FRACTURE SURGERY     GASTRIC BY PASS     TUBAL LIGATION  2006    Family History  Problem Relation Age of Onset   Heart failure Mother    Hyperlipidemia Mother    Hypertension Mother    Diabetes Mother    Sudden death Mother    Depression Mother    Bipolar disorder Mother    Schizophrenia Mother    Hypertension Father    Colon cancer Neg Hx    Esophageal cancer Neg Hx    Rectal cancer Neg Hx    Stomach cancer Neg Hx     Social History   Socioeconomic History   Marital status:  Single    Spouse name: Not on file   Number of children: Not on file   Years of education: Not on file   Highest education level: Not on file  Occupational History   Occupation: DISABLE  Tobacco Use   Smoking status: Former    Current packs/day: 0.00    Average packs/day: 0.3 packs/day for 4.0 years (1.0 ttl pk-yrs)    Types: Cigarettes    Start date: 06/12/2000    Quit date: 06/12/2004    Years since quitting: 19.4   Smokeless tobacco: Never  Vaping Use   Vaping status: Never Used  Substance and Sexual Activity   Alcohol use: No   Drug use: No   Sexual activity: Yes    Birth control/protection: None    Comment: Novasure  Other Topics Concern   Not on file  Social History Narrative   Not on file   Social Drivers of Health   Financial Resource Strain: Not on file  Food Insecurity: Not on file  Transportation Needs: Not on file  Physical Activity: Not on file  Stress: Not on file  Social Connections: Not on file  Intimate Partner Violence: Not on  file    Allergies  Allergen Reactions   Sibutramine Hcl Monohydrate Hives    Current Outpatient Medications  Medication Sig Dispense Refill   Accu-Chek Softclix Lancets lancets USE TO test fasting blood sugar EVERY DAY IN THE MORNING 100 each 6   albuterol  (VENTOLIN  HFA) 108 (90 Base) MCG/ACT inhaler inhale 2 PUFFS BY MOUTH every 4 hours as needed FOR wheezing OR shortness of breath 18 g 1   ARIPiprazole  (ABILIFY ) 20 MG tablet Take 20 mg by mouth at bedtime as needed.     atorvastatin  (LIPITOR) 20 MG tablet Take 1 tablet (20 mg total) by mouth daily. 90 tablet 3   B Complex-C-Folic Acid  (B COMPLEX-VITAMIN C-FOLIC ACID ) 1 MG tablet Take 1 tablet by mouth daily with breakfast. 90 tablet 1   benztropine (COGENTIN) 1 MG tablet Take 1 mg by mouth 2 (two) times daily.     blood glucose meter kit and supplies KIT Dispense based on patient and insurance preference. Use up to four times daily as directed. (FOR ICD-9 250.00, 250.01). 1  each 0   Blood Glucose Monitoring Suppl (ACCU-CHEK GUIDE) w/Device KIT Use to check blood sugar 3x per day. E11.9 1 kit 0   busPIRone (BUSPAR) 7.5 MG tablet Take 7.5 mg by mouth 2 (two) times daily.     Cyanocobalamin  1000 MCG CAPS Take 1 tablet by mouth daily.     ferrous sulfate  325 (65 FE) MG tablet Take 325 mg by mouth in the morning and at bedtime.     FLUoxetine (PROZAC) 40 MG capsule Take 40 mg by mouth daily.     gabapentin  (NEURONTIN ) 300 MG capsule 300 in AM and midday then 600 at night 90 capsule 3   glucose blood (ACCU-CHEK GUIDE TEST) test strip Use to check blood sugar 3x per day. E11.9 100 each 12   haloperidol (HALDOL) 2 MG tablet Take 2 mg by mouth daily.     hydrOXYzine  (VISTARIL ) 25 MG capsule Take 25 mg by mouth every evening. Every evening as needed     linaclotide  (LINZESS ) 72 MCG capsule Take 1 capsule (72 mcg total) by mouth daily before breakfast. 90 capsule 0   meclizine  (ANTIVERT ) 12.5 MG tablet Take 1 tablet (12.5 mg total) by mouth 3 (three) times daily as needed for dizziness. 20 tablet 0   metFORMIN  (GLUCOPHAGE ) 500 MG tablet Take 1 tablet (500 mg total) by mouth 2 (two) times daily with a meal. 60 tablet 1   metoprolol  tartrate (LOPRESSOR ) 25 MG tablet TAKE ONE-HALF TABLET BY MOUTH TWICE DAILY AS NEEDED 90 tablet 3   nystatin  (MYCOSTATIN /NYSTOP ) powder Apply 1 Application topically 3 (three) times daily. 15 g 11   OLANZapine (ZYPREXA) 20 MG tablet Take 20 mg by mouth at bedtime.     ondansetron  (ZOFRAN ) 4 MG tablet Take 1 tablet (4 mg total) by mouth every 8 (eight) hours as needed for nausea or vomiting. 10 tablet 0   Prenatal Vit-Fe Fum-FA-Omega (PNV PRENATAL PLUS MULTIVIT+DHA) 27-1 & 312 MG MISC 1 po qd 90 each 0   Probiotic Product (ACIDOPHILUS PROBIOTIC BLEND PO) Take by mouth.     Semaglutide , 1 MG/DOSE, 4 MG/3ML SOPN Inject 1 mg as directed once a week. 3 mL 1   traZODone (DESYREL) 50 MG tablet Take 50 mg by mouth at bedtime as needed for sleep.      valsartan  (DIOVAN ) 40 MG tablet Take 1 tablet (40 mg total) by mouth at bedtime. 30 tablet 3   Vitamin D , Ergocalciferol , (  DRISDOL ) 1.25 MG (50000 UNIT) CAPS capsule 1 tab q wed and 1 tab q sun 8 capsule 1   No current facility-administered medications for this visit.    PHYSICAL EXAM Vitals:   11/27/23 1240  BP: 102/71  Pulse: 76  Temp: 97.9 F (36.6 C)  SpO2: 98%  Weight: (!) 326 lb 12.8 oz (148.2 kg)  Height: 6' 1 (1.854 m)    Well appearing obese woman in no distress Regular rate and rhythm Unlabored breathing 2+ DP pulses  PERTINENT LABORATORY AND RADIOLOGIC DATA  Most recent CBC    Latest Ref Rng & Units 07/24/2023   11:12 AM 09/06/2022   10:57 AM 07/04/2022   10:37 AM  CBC  WBC 3.4 - 10.8 x10E3/uL 8.7  6.6  10.4   Hemoglobin 11.1 - 15.9 g/dL 16.1  09.6  04.5   Hematocrit 34.0 - 46.6 % 40.6  42.8  43.4   Platelets 150 - 450 x10E3/uL 357  388  354      Most recent CMP    Latest Ref Rng & Units 10/25/2023    2:13 PM 07/06/2023    9:54 AM 04/11/2023   10:50 AM  CMP  Glucose 70 - 99 mg/dL 89  83  81   BUN 6 - 24 mg/dL 13  13  8    Creatinine 0.57 - 1.00 mg/dL 4.09  8.11  9.14   Sodium 134 - 144 mmol/L 142  142  142   Potassium 3.5 - 5.2 mmol/L 3.9  4.6  3.9   Chloride 96 - 106 mmol/L 104  101  103   CO2 20 - 29 mmol/L 24  27  24    Calcium  8.7 - 10.2 mg/dL 8.9  9.5  9.0   Total Protein 6.0 - 8.5 g/dL   6.3   Total Bilirubin 0.0 - 1.2 mg/dL   1.1   Alkaline Phos 44 - 121 IU/L   56   AST 0 - 40 IU/L   20   ALT 0 - 32 IU/L   16     Renal function CrCl cannot be calculated (Patient's most recent lab result is older than the maximum 21 days allowed.).  HbA1c, POC (controlled diabetic range) (%)  Date Value  02/12/2020 6.2   Hgb A1c MFr Bld (%)  Date Value  08/08/2023 5.5    LDL Chol Calc (NIH)  Date Value Ref Range Status  02/16/2023 56 0 - 99 mg/dL Final     +-------+-----------+-----------+------------+------------+  ABI/TBIToday's ABIToday's  TBIPrevious ABIPrevious TBI  +-------+-----------+-----------+------------+------------+  Right 1.04       0.87                                 +-------+-----------+-----------+------------+------------+  Left  1.01       0.99                                 +-------+-----------+-----------+------------+------------+     Heber Little. Edgardo Goodwill, MD FACS [facility] Phone Number: [phone] 11/27/2023 3:25 PM   Total time spent on preparing this encounter including chart review, data review, collecting history, examining the patient, and coordinating care: 45 minutes.  Portions of this report may have been transcribed using voice recognition software.  Every effort has been made to ensure accuracy; however, inadvertent computerized transcription errors may still be  present.

## 2023-11-27 ENCOUNTER — Ambulatory Visit: Payer: Self-pay | Admitting: Family Medicine

## 2023-11-27 ENCOUNTER — Ambulatory Visit: Admitting: Vascular Surgery

## 2023-11-27 ENCOUNTER — Ambulatory Visit (HOSPITAL_COMMUNITY)
Admission: RE | Admit: 2023-11-27 | Discharge: 2023-11-27 | Disposition: A | Discharge status: Home / Self Care | Source: Ambulatory Visit | Attending: Cardiovascular Disease | Admitting: Cardiovascular Disease

## 2023-11-27 ENCOUNTER — Ambulatory Visit (HOSPITAL_COMMUNITY)
Admission: RE | Admit: 2023-11-27 | Discharge: 2023-11-27 | Disposition: A | Discharge status: Home / Self Care | Source: Ambulatory Visit | Attending: Vascular Surgery | Admitting: Vascular Surgery

## 2023-11-27 ENCOUNTER — Encounter: Payer: Self-pay | Admitting: Vascular Surgery

## 2023-11-27 VITALS — BP 102/71 | HR 76 | Temp 97.9°F | Ht 73.0 in | Wt 326.8 lb

## 2023-11-27 DIAGNOSIS — M722 Plantar fascial fibromatosis: Secondary | ICD-10-CM | POA: Insufficient documentation

## 2023-11-27 DIAGNOSIS — R0989 Other specified symptoms and signs involving the circulatory and respiratory systems: Secondary | ICD-10-CM | POA: Diagnosis not present

## 2023-11-27 DIAGNOSIS — R55 Syncope and collapse: Secondary | ICD-10-CM | POA: Diagnosis not present

## 2023-11-27 LAB — ECHOCARDIOGRAM COMPLETE
Area-P 1/2: 3.63 cm2
S' Lateral: 3.6 cm

## 2023-11-27 LAB — VAS US ABI WITH/WO TBI
Left ABI: 1.01
Right ABI: 1.04

## 2023-11-28 ENCOUNTER — Encounter (HOSPITAL_BASED_OUTPATIENT_CLINIC_OR_DEPARTMENT_OTHER): Payer: Self-pay | Admitting: Physical Therapy

## 2023-11-28 ENCOUNTER — Ambulatory Visit (HOSPITAL_BASED_OUTPATIENT_CLINIC_OR_DEPARTMENT_OTHER): Admitting: Physical Therapy

## 2023-11-28 DIAGNOSIS — G5793 Unspecified mononeuropathy of bilateral lower limbs: Secondary | ICD-10-CM | POA: Diagnosis not present

## 2023-11-28 DIAGNOSIS — M19172 Post-traumatic osteoarthritis, left ankle and foot: Secondary | ICD-10-CM

## 2023-11-28 DIAGNOSIS — M25372 Other instability, left ankle: Secondary | ICD-10-CM | POA: Diagnosis not present

## 2023-11-28 NOTE — Therapy (Signed)
 OUTPATIENT PHYSICAL THERAPY TREATMENT   Patient Name: Karen Brennan MRN: [id number] DOB:01-06-1976, 48 y.o., female Today's Date: 11/28/2023  END OF SESSION:  PT End of Session - 11/28/23 0854     Visit Number 4    Number of Visits 13    Date for PT Re-Evaluation 12/14/23    Authorization Type [employer] MCD    PT Start Time 0847    PT Stop Time 0926    PT Time Calculation (min) 39 min    Activity Tolerance Patient tolerated treatment well    Behavior During Therapy [facility] for tasks assessed/performed          Past Medical History:  Diagnosis Date   ANEMIA 08/14/2008   Qualifier: Diagnosis of  By: Chanetta Comes MD, Jessica     Anxiety    Asthma    B12 deficiency 08/29/2021   Bipolar 1 disorder (HCC)    Blood transfusion    Cellulitis 07/15/2020   Of right buttock   Chest pain    Depression    Edema of both lower extremities    GERD (gastroesophageal reflux disease)    History of bronchitis    Hypertension    Kidney stones 10/09/2011   Migraines 10/09/2011   often   Morbid obesity with BMI of 50.0-59.9, adult (HCC) 08/09/2006   Qualifier: Diagnosis of   By: Chanetta Comes MD, Jessica         Pre-diabetes    Schizophrenia (HCC)    SOB (shortness of breath)    Vitamin D  deficiency 08/29/2021   Past Surgical History:  Procedure Laterality Date   ANKLE FRACTURE SURGERY   1990's   left; put screws in   ENDOMETRIAL ABLATION  ~ 2011   FRACTURE SURGERY     GASTRIC BY PASS     TUBAL LIGATION  2006   Patient Active Problem List   Diagnosis Date Noted   Severe dysplasia of cervix (CIN III) 08/20/2023   Open wound of foot, right, initial encounter 06/25/2023   Plantar fasciitis 06/25/2023   BMI 45.0-49.9, adult (HCC)-current bmi 46.8 07/12/2022   Chronic constipation 06/14/2022   Seasonal allergies 05/01/2022   Hyperlipidemia associated with type 2 diabetes mellitus (HCC) 12/05/2021   Type II diabetes mellitus (HCC) 08/29/2021   PVC's (premature ventricular contractions)  07/19/2021   Intertrigo 02/16/2020   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 Include strengthening exercises and aquatic therapy in treatment plan.   Rationale for Evaluation and Treatment: Rehabilitation  THERAPY DIAG:  Instability of left ankle joint  Post-traumatic osteoarthritis of left ankle  ONSET DATE: 2-3 mo ago (around Feb 2025) acute on chronic   SUBJECTIVE:  SUBJECTIVE STATEMENT: Pt reports she went to dr and had testing completed yesterday, they didn't find anything.   She still has occasional dizziness.  She reports that doctors are still decreasing medicines.  She reports she hasn't been to pool yet.  POOL ACCESS:  [facility]  From initial evaluation: Broke my ankle when I was a teenager, hardware removed in the past. It swells really bad. I usually wear compression socks and ASO with tennis shoes. Sometimes limited in shoe wear by ankle edema. Two falls in the last 2 months due to dizziness, blacked out and fell on knees. On BP meds- changing due to recent weight loss.   PERTINENT HISTORY:  Peripheral neuropathy- reports occasional sensation of pressure of floor on foot, gabapentin  for management.   PAIN:   Are you having pain? Yes: NPRS scale: 5/10 Pain location: L ankle and lower leg  Pain description: tight, tender Aggravating factors: standing too long Relieving factors: ibuprofen , tylenol   PRECAUTIONS:  Fall  RED FLAGS: None   WEIGHT BEARING RESTRICTIONS:  No  FALLS:  Has patient fallen in last 6 months? Yes. Number of falls 5  LIVING ENVIRONMENT: Lives with: lives with their daughter x3 Lives in: House/apartment- no stairs  OCCUPATION:  On disability  PLOF:  Sometimes needs help with bathing and getting off the couch  PATIENT GOALS:  Working on weight loss   OBJECTIVE:  Note: Objective measures were completed at Evaluation unless otherwise noted.  DIAGNOSTIC FINDINGS:  WFL  PATIENT SURVEYS:  LEFS  Extreme difficulty/unable (0), Quite a bit of difficulty (1), Moderate difficulty (2), Little difficulty (3), No difficulty (4) Survey date:  5/23  Any of your usual work, housework or school activities 1  2. Usual hobbies, recreational or sporting activities 0  3. Getting into/out of the bath 0  4. Walking between rooms 1  5. Putting on socks/shoes 1  6. Squatting  0  7. Lifting an object, like a bag of groceries from the floor 1  8. Performing light activities around your home 1  9. Performing heavy activities around your home 0  10. Getting into/out of a car 0  11. Walking 2 blocks 0  12. Walking 1 mile 0  13. Going up/down 10 stairs (1 flight) 0  14. Standing for 1 hour 0  15.  sitting for 1 hour 0  16. Running on even ground 0  17. Running on uneven ground 0  18. Making sharp turns while running fast 0  19. Hopping  0  20. Rolling over in bed 1  Score total:  6     COGNITIVE STATUS: Within functional limits for tasks assessed   SENSATION: Limited due to peripheral neuropathy in bilat feet  EDEMA:  Yes: Lt lateral maleolus   GAIT: Eval: flexed posture, lacking DF at heel strike bilat, antalgic, slow cadence.    Body Part #1  Ankle  EVAL ankle AROM DF: knee flexed-3, knee ext -10  Able to stand with DF at -3 deg, unable to demo standing toe raise  FUNCTIONAL TESTS:  Berg Balance Scale:  Item Test date: 5/23EVAL Test date:  Test date:   Sitting to standing 1. needs minimal aid to stand or stabilize Insert OPRCBERGREEVAL SmartPhrase at re-test date Insert OPRCBERGREEVAL SmartPhrase at re-test date  2. Standing unsupported 4. able to stand safely for 2 minutes    3. Sitting with back unsupported, feet supported 4. able to sit safely and securely for 2 minutes    4.  Standing to sitting 1. sits independently but has uncontrolled descent    5. Pivot transfer  3. able to transfer safely with definite need of hands    6. Standing unsupported with eyes closed 4. able to stand 10 seconds safely    7. Standing unsupported with feet together 4. able to place feet together independently and stand 1 minute safely    8. Reaching forward with outstretched arms while standing 3. can reach forward 12 cm (5 inches)    9. Pick up object from the floor from standing 3. able to pick up slipper but needs supervision    10. Turning to look behind over left and right shoulders while standing 3. looks behind one side only, other side shows less weight shift    11. Turn 360 degrees 2. able to turn 360 degrees safely but slowly    12. Place alternate foot on step or stool while standing unsupported 2. able to complete 4 steps without aid with supervision    13. Standing unsupported one foot in front 3. able to place foot ahead independently and hold 30 seconds    14. Standing on one leg 1. tries to lift leg unable to hold 3 seconds but remains standing independently.      Total Score 38/56 Total Score /56 Total Score /56                                                                                                                                 TREATMENT DATE:  [facility] Adult PT Treatment:                                              11/29/23 Pt seen for aquatic therapy today.  Treatment took place in water 3.5-4.75 ft in depth at the [facility] pool. Temp of water was 91.  Pt entered/exited the pool via stairs independently in step-to pattern with bilat rail.  - unsupported walking forward/ backward (in deepest water) - cues for heel/toe and toe/heel, multiple laps - unsupported side stepping  - bilat knee to chest stretch with feet in bottom ladder hole, UE on rails x 20s, then gentle hip swing - side stepping with arm addct/ abdct with rainbow hand floats - suitcase carry with bilat rainbow hand floats at sides walking backward - UE on rainbow hand floats - SLS (challenge, 2 sec each)->  SLS with 3 way toe taps 2x5  - repeated knee to chest stretch at ladder - tandem gait forward/ backward, UE on rainbow hand floats - UE on wall:  toe/heel raises x 10; hip add/abd x10 ; hamstring curls x 10 each LE - R/L forward step ups on 1st step with BUE on rails, cues for forward weight shift /neutral lower leg (vs valgus)/core engaged x 10 - R/L  hamstring stretch with foot on 3rd step x 20s each  * once dried off applied reg rock tape to L dorsum of foot, and ankle in 2 stirrups, one crossing at talus to provide increased proprioception. Pt instructed in safe tape removal technique; verbalized understanding.     Pt requires the buoyancy and hydrostatic pressure of water for support, and to offload joints by unweighting joint load by at least 50 % in navel deep water and by at least 75-80% in chest to neck deep water.  Viscosity of the water is needed for resistance of strengthening. Water current perturbations provides challenge to standing balance requiring increased core activation.      PATIENT EDUCATION:  Education details: intro to aquatic therapy  Person educated: Patient Education method: Explanation, Demonstration, Tactile cues, and Verbal cues Education comprehension: verbalized understanding, returned  demonstration, verbal cues required, tactile cues required, and needs further education  HOME EXERCISE PROGRAM: To be established at pool   ASSESSMENT:  CLINICAL IMPRESSION: Pt reported some increase in lower back pain/tightness with walking in water; relieved with knee to chest stretch at ladder.  Good tolerance for exercises completed today with slight reduction in L ankle pain during session. Plan to work on STS from bench next visit.  Goals ongoing.     From initial evaluation:  Patient is a 48 y.o. F who was seen today for physical therapy evaluation and treatment for Lt ankle pain- subacute on chronic onset. Pt has recently lost 130lb and has very limited central stability leading to distal pain and lack of control. Able to ambulate but lacks DF which puts her at a trip risk. Pt will benefit significantly from aquatic therapy for gross biomechanical chain strengthening and endurance challenge. I did introduce her to aquatics today and walked her back to the pool.     OBJECTIVE IMPAIRMENTS: Abnormal gait, decreased activity tolerance, decreased balance, decreased coordination, decreased mobility, difficulty walking, decreased ROM, decreased strength, dizziness, increased edema, impaired flexibility, impaired sensation, improper body mechanics, obesity, and pain.   ACTIVITY LIMITATIONS: standing, squatting, stairs, transfers, bed mobility, bathing, toileting, hygiene/grooming, and locomotion level  PARTICIPATION LIMITATIONS: cleaning, laundry, shopping, and community activity  PERSONAL FACTORS: obesity & weight changes, blood pressure changes/medication management, peripheral neuropathy, recent falls are also affecting patient's functional outcome.   REHAB POTENTIAL: Good  CLINICAL DECISION MAKING: Unstable/unpredictable  EVALUATION COMPLEXITY: High   GOALS: Goals reviewed with patient? Yes  SHORT TERM GOALS: Target date: 6/13  Verbalize awareness of core engagement in  exercises Baseline: will progress as appropriate Goal status: Met 11/26/23  2.  Able to navigate stairs into/out of pool with proper, reciprocal form Baseline: will progress as appropriate Goal status: IN PROGRESS- 11/28/23  (step-to pattern in; step through pattern out)   LONG TERM GOALS: Target date: POC date  BERG to improve by MDC Baseline: see obj Goal status: INITIAL  2.  LEFS to improve by MDC Baseline: see obj Goal status: INITIAL  3.  Able to stand from standard chair without UEs Baseline: unable at eval Goal status: INITIAL  4.   Active DF to, at least, neutral with knee extended Baseline: see obj Goal status: INITIAL    PLAN:  PT FREQUENCY: 1-2x/week  PT DURATION: POC date  PLANNED INTERVENTIONS: 97164- PT Re-evaluation, 97750- Physical Performance Testing, 97110-Therapeutic exercises, 97530- Therapeutic activity, [id number]- Neuromuscular re-education, 97535- Self Care, 16109- Manual therapy, [id number]- Gait training, [id number]- Aquatic Therapy, Patient/Family education, Balance training, Stair training, Taping, Dry Needling, Joint mobilization, Spinal  mobilization, Cryotherapy, and Moist heat.  PLAN FOR NEXT SESSION: continue aquatic therapy.    Almedia Jacobsen, PTA 11/28/23 9:31 AM [facility] [address] [place], [place], [address] Phone: [phone]   Fax:  [phone]   For all possible CPT codes, reference the Planned Interventions line above.     Check all conditions that are expected to impact treatment: {Conditions expected to impact treatment:Morbid obesity and Neurological condition and/or seizures   If treatment provided at initial evaluation, no treatment charged due to lack of authorization.

## 2023-11-30 DIAGNOSIS — G5793 Unspecified mononeuropathy of bilateral lower limbs: Secondary | ICD-10-CM | POA: Diagnosis not present

## 2023-12-02 ENCOUNTER — Ambulatory Visit
Admission: RE | Admit: 2023-12-02 | Discharge: 2023-12-02 | Disposition: A | Discharge status: Home / Self Care | Source: Ambulatory Visit | Attending: Nurse Practitioner | Admitting: Nurse Practitioner

## 2023-12-02 VITALS — BP 100/70 | HR 85 | Temp 98.2°F | Resp 18 | Ht 73.0 in | Wt 320.0 lb

## 2023-12-02 DIAGNOSIS — R350 Frequency of micturition: Secondary | ICD-10-CM | POA: Diagnosis not present

## 2023-12-02 DIAGNOSIS — H6591 Unspecified nonsuppurative otitis media, right ear: Secondary | ICD-10-CM

## 2023-12-02 DIAGNOSIS — R110 Nausea: Secondary | ICD-10-CM | POA: Diagnosis not present

## 2023-12-02 LAB — POCT URINALYSIS DIP (MANUAL ENTRY)
Bilirubin, UA: NEGATIVE
Glucose, UA: NEGATIVE mg/dL
Ketones, POC UA: NEGATIVE mg/dL
Nitrite, UA: NEGATIVE
Spec Grav, UA: 1.025 (ref 1.010–1.025)
Urobilinogen, UA: 0.2 U/dL
pH, UA: 6.5 (ref 5.0–8.0)

## 2023-12-02 MED ORDER — FLUTICASONE PROPIONATE 50 MCG/ACT NA SUSP: 2.0000 | Freq: Every day | NASAL | 0 refills | Status: DC

## 2023-12-02 MED ORDER — NITROFURANTOIN MONOHYD MACRO 100 MG PO CAPS: 100.0000 mg | ORAL_CAPSULE | Freq: Two times a day (BID) | ORAL | 0 refills | Status: DC

## 2023-12-02 MED ORDER — CETIRIZINE-PSEUDOEPHEDRINE ER 5-120 MG PO TB12: 1.0000 | ORAL_TABLET | Freq: Every day | ORAL | 0 refills | Status: DC

## 2023-12-02 MED ORDER — ONDANSETRON 8 MG PO TBDP: 8.0000 mg | ORAL_TABLET | Freq: Three times a day (TID) | ORAL | 0 refills | Status: DC | PRN

## 2023-12-02 NOTE — ED Triage Notes (Signed)
 Ear pain, and UTI maybe. - Entered by patient

## 2023-12-02 NOTE — ED Provider Notes (Signed)
 [facility]    CSN: [id number] Arrival date & time: 12/02/23  9161      History   Chief Complaint Chief Complaint  Patient presents with   UTI Symptoms   Otalgia    HPI Karen Brennan is a 48 y.o. female.   Karen Brennan is a 48 y.o. female that presents with bilateral ear pain and lower back pain, both ongoing for approximately 3 days. She also reports nausea and urinary frequency. The patient describes feeling fluid in both ears, with the right ear causing more severe pain. She denies associated symptoms such as nasal congestion, runny nose, sneezing, or sore throat. The ear pain has affected her hearing, as she reports she can barely hear out of the right ear. She denies recent swimming, water exposure, air travel, or allergy symptoms. The patient has taken tylenol  and ibuprofen  for her symptoms without any relief. Concurrent with the ear pain, the patient has been experiencing lower back pain and stomach pain for 2-3 days. She reports her urine has a real bad odor but denies dysuria, increased urinary frequency, or vaginal discharge. The patient also mentions having a headache. The patient denies fever, body aches, or recent cold or cough symptoms.  The following portions of the patient's history were reviewed and updated as appropriate: allergies, current medications, past family history, past medical history, past social history, past surgical history, and problem list     Past Medical History:  Diagnosis Date   ANEMIA 08/14/2008   Qualifier: Diagnosis of  By: Joyice MD, Jessica     Anxiety    Asthma    B12 deficiency 08/29/2021   Bipolar 1 disorder (HCC)    Blood transfusion    Cellulitis 07/15/2020   Of right buttock   Chest pain    Depression    Edema of both lower extremities    GERD (gastroesophageal reflux disease)    History of bronchitis    Hypertension    Kidney stones 10/09/2011   Migraines 10/09/2011   often   Morbid obesity with BMI  of 50.0-59.9, adult (HCC) 08/09/2006   Qualifier: Diagnosis of   By: Joyice MD, Jessica         Pre-diabetes    Schizophrenia (HCC)    SOB (shortness of breath)    Vitamin D  deficiency 08/29/2021    Patient Active Problem List   Diagnosis Date Noted   Severe dysplasia of cervix (CIN III) 08/20/2023   Open wound of foot, right, initial encounter 06/25/2023   Plantar fasciitis 06/25/2023   BMI 45.0-49.9, adult (HCC)-current bmi 46.8 07/12/2022   Chronic constipation 06/14/2022   Seasonal allergies 05/01/2022   Hyperlipidemia associated with type 2 diabetes mellitus (HCC) 12/05/2021   Type II diabetes mellitus (HCC) 08/29/2021   PVC's (premature ventricular contractions) 07/19/2021   Intertrigo 02/16/2020   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   Neuropathic pain of both feet 05/09/2016   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   Anxiety 10/25/2011   Urinary tract infection with hematuria 10/12/2011   Bipolar 1 disorder, mixed (HCC) 09/15/2011   Allergic rhinitis 08/31/2008   Esophageal reflux 01/24/2008   Hypertension associated with diabetes (HCC) 08/17/2006    Past Surgical History:  Procedure Laterality Date   ANKLE FRACTURE SURGERY   1990's   left; put screws in   ENDOMETRIAL ABLATION  ~ 2011   FRACTURE SURGERY     GASTRIC BY PASS     TUBAL LIGATION  2006  OB History     Gravida  5   Para  4   Term  4   Preterm      AB  1   Living  4      SAB      IAB      Ectopic  1   Multiple      Live Births               Home Medications    Prior to Admission medications   Medication Sig Start Date End Date Taking? Authorizing Provider  cetirizine -pseudoephedrine (ZYRTEC -D) 5-120 MG tablet Take 1 tablet by mouth daily. 12/02/23  Yes Iola Lukes, FNP  fluticasone  (FLONASE ) 50 MCG/ACT nasal spray Place 2 sprays into both nostrils daily. Shake well before use. Gently blow nose before spraying. Do not blow nose  immediately after use. You should not taste the medication or feel it going down your throat; if you do, adjust your technique. 12/02/23  Yes Emori Mumme, Lukes, FNP  nitrofurantoin , macrocrystal-monohydrate, (MACROBID ) 100 MG capsule Take 1 capsule (100 mg total) by mouth 2 (two) times daily. 12/02/23  Yes Iola Lukes, FNP  ondansetron  (ZOFRAN -ODT) 8 MG disintegrating tablet Take 1 tablet (8 mg total) by mouth every 8 (eight) hours as needed for nausea or vomiting. 12/02/23  Yes Iola Lukes, FNP  Accu-Chek Softclix Lancets lancets USE TO test fasting blood sugar EVERY DAY IN THE MORNING 10/11/23   Everhart, Kirstie, DO  albuterol  (VENTOLIN  HFA) 108 (90 Base) MCG/ACT inhaler inhale 2 PUFFS BY MOUTH every 4 hours as needed FOR wheezing OR shortness of breath 11/08/23   Hindel, Leah, MD  ARIPiprazole  (ABILIFY ) 20 MG tablet Take 20 mg by mouth at bedtime as needed.    [provider]  atorvastatin  (LIPITOR) 20 MG tablet Take 1 tablet (20 mg total) by mouth daily. 10/11/23   Everhart, Kirstie, DO  B Complex-C-Folic Acid  (B COMPLEX-VITAMIN C-FOLIC ACID ) 1 MG tablet Take 1 tablet by mouth daily with breakfast. 09/19/23   Opalski, Barnie, DO  benztropine (COGENTIN) 1 MG tablet Take 1 mg by mouth 2 (two) times daily. 06/18/19   [provider]  blood glucose meter kit and supplies KIT Dispense based on patient and insurance preference. Use up to four times daily as directed. (FOR ICD-9 250.00, 250.01). 10/11/23   Everhart, Kirstie, DO  Blood Glucose Monitoring Suppl (ACCU-CHEK GUIDE) w/Device KIT Use to check blood sugar 3x per day. E11.9 10/12/23   Everhart, Kirstie, DO  busPIRone (BUSPAR) 7.5 MG tablet Take 7.5 mg by mouth 2 (two) times daily. 11/15/21   [provider]  Cyanocobalamin  1000 MCG CAPS Take 1 tablet by mouth daily. 04/11/23   [provider]  ferrous sulfate  325 (65 FE) MG tablet Take 325 mg by mouth in the morning and at bedtime. 08/25/21   [provider]   FLUoxetine (PROZAC) 40 MG capsule Take 40 mg by mouth daily.    [provider]  gabapentin  (NEURONTIN ) 300 MG capsule 300 in AM and midday then 600 at night 10/05/23   Bryan Bianchi, MD  glucose blood (ACCU-CHEK GUIDE TEST) test strip Use to check blood sugar 3x per day. E11.9 10/12/23   Everhart, Kirstie, DO  haloperidol (HALDOL) 2 MG tablet Take 2 mg by mouth daily.    [provider]  hydrOXYzine  (VISTARIL ) 25 MG capsule Take 25 mg by mouth every evening. Every evening as needed 10/24/20   [provider]  linaclotide  (LINZESS ) 72 MCG capsule  Take 1 capsule (72 mcg total) by mouth daily before breakfast. 11/19/23   Opalski, Barnie, DO  meclizine  (ANTIVERT ) 12.5 MG tablet Take 1 tablet (12.5 mg total) by mouth 3 (three) times daily as needed for dizziness. 07/24/23   Bryan Bianchi, MD  metFORMIN  (GLUCOPHAGE ) 500 MG tablet Take 1 tablet (500 mg total) by mouth 2 (two) times daily with a meal. 09/19/23   Opalski, Barnie, DO  metoprolol  tartrate (LOPRESSOR ) 25 MG tablet TAKE ONE-HALF TABLET BY MOUTH TWICE DAILY AS NEEDED 08/08/23   Loistine Barnie, NP  nystatin  (MYCOSTATIN /NYSTOP ) powder Apply 1 Application topically 3 (three) times daily. 07/06/23   Hindel, Rea, MD  OLANZapine (ZYPREXA) 20 MG tablet Take 20 mg by mouth at bedtime.    [provider]  ondansetron  (ZOFRAN ) 4 MG tablet Take 1 tablet (4 mg total) by mouth every 8 (eight) hours as needed for nausea or vomiting. 10/25/23   Everhart, Kirstie, DO  Prenatal Vit-Fe Fum-FA-Omega (PNV PRENATAL PLUS MULTIVIT+DHA) 27-1 & 312 MG MISC 1 po qd 11/19/23   Opalski, Barnie, DO  Probiotic Product (ACIDOPHILUS PROBIOTIC BLEND PO) Take by mouth.    [provider]  Semaglutide , 1 MG/DOSE, 4 MG/3ML SOPN Inject 1 mg as directed once a week. 11/19/23   Midge Barnie, DO  traZODone (DESYREL) 50 MG tablet Take 50 mg by mouth at bedtime as needed for sleep. 04/07/19   [provider]  valsartan  (DIOVAN ) 40 MG  tablet Take 1 tablet (40 mg total) by mouth at bedtime. 10/25/23   Everhart, Kirstie, DO  Vitamin D , Ergocalciferol , (DRISDOL ) 1.25 MG (50000 UNIT) CAPS capsule 1 tab q wed and 1 tab q sun 11/19/23   Opalski, Barnie, DO    Family History Family History  Problem Relation Age of Onset   Heart failure Mother    Hyperlipidemia Mother    Hypertension Mother    Diabetes Mother    Sudden death Mother    Depression Mother    Bipolar disorder Mother    Schizophrenia Mother    Hypertension Father    Colon cancer Neg Hx    Esophageal cancer Neg Hx    Rectal cancer Neg Hx    Stomach cancer Neg Hx     Social History Social History   Tobacco Use   Smoking status: Former    Current packs/day: 0.00    Average packs/day: 0.3 packs/day for 4.0 years (1.0 ttl pk-yrs)    Types: Cigarettes    Start date: 06/12/2000    Quit date: 06/12/2004    Years since quitting: 19.4   Smokeless tobacco: Never  Vaping Use   Vaping status: Never Used  Substance Use Topics   Alcohol use: No   Drug use: No     Allergies   Sibutramine hcl monohydrate   Review of Systems Review of Systems  Constitutional:  Negative for fever.  HENT:  Positive for ear pain (right). Negative for congestion, postnasal drip, rhinorrhea and sore throat.   Gastrointestinal:  Positive for nausea.  Genitourinary:  Positive for frequency. Negative for dysuria and vaginal discharge.       Odor to urine  Musculoskeletal:  Positive for back pain (lower). Negative for myalgias.  Neurological:  Positive for headaches.  All other systems reviewed and are negative.    Physical Exam Triage Vital Signs ED Triage Vitals [12/02/23 0855]  Encounter Vitals Group     BP      Girls Systolic BP Percentile      Girls Diastolic  BP Percentile      Boys Systolic BP Percentile      Boys Diastolic BP Percentile      Pulse      Resp      Temp      Temp src      SpO2      Weight (!) 320 lb (145.2 kg)     Height 6' 1 (1.854 m)     Head  Circumference      Peak Flow      Pain Score 7     Pain Loc      Pain Education      Exclude from Growth Chart    No data found.  Updated Vital Signs BP 100/70 (BP Location: Left Wrist)   Pulse 85   Temp 98.2 F (36.8 C) (Oral)   Resp 18   Ht 6' 1 (1.854 m)   Wt (!) 320 lb (145.2 kg)   SpO2 98%   BMI 42.22 kg/m   Visual Acuity Right Eye Distance:   Left Eye Distance:   Bilateral Distance:    Right Eye Near:   Left Eye Near:    Bilateral Near:     Physical Exam Vitals reviewed.  Constitutional:      General: She is awake. She is not in acute distress.    Appearance: Normal appearance. She is well-developed. She is morbidly obese. She is not ill-appearing or toxic-appearing.  HENT:     Head: Normocephalic.     Right Ear: Hearing and external ear normal. Tenderness present. No swelling. A middle ear effusion is present. Tympanic membrane is not erythematous.     Left Ear: Hearing, tympanic membrane, ear canal and external ear normal. No drainage, swelling or tenderness.  No middle ear effusion. Tympanic membrane is not erythematous.     Mouth/Throat:     Mouth: Mucous membranes are moist.   Eyes:     Conjunctiva/sclera: Conjunctivae normal.    Cardiovascular:     Rate and Rhythm: Normal rate and regular rhythm.     Heart sounds: Normal heart sounds.  Pulmonary:     Effort: Pulmonary effort is normal.     Breath sounds: Normal breath sounds.   Musculoskeletal:        General: Normal range of motion.   Skin:    General: Skin is warm and dry.   Neurological:     General: No focal deficit present.     Mental Status: She is alert and oriented to person, place, and time.   Psychiatric:        Behavior: Behavior is cooperative.      UC Treatments / Results  Labs (all labs ordered are listed, but only abnormal results are displayed) Labs Reviewed  POCT URINALYSIS DIP (MANUAL ENTRY) - Abnormal; Notable for the following components:      Result Value    Clarity, UA cloudy (*)    Blood, UA trace-intact (*)    Protein Ur, POC trace (*)    Leukocytes, UA Trace (*)    All other components within normal limits  URINE CULTURE    EKG   Radiology No results found.  Procedures Procedures (including critical care time)  Medications Ordered in UC Medications - No data to display  Initial Impression / Assessment and Plan / UC Course  I have reviewed the triage vital signs and the nursing notes.  Pertinent labs & imaging results that were available during my care of the patient were  reviewed by me and considered in my medical decision making (see chart for details).     Patient presents with bilateral ear pain for the past three days, worse on the right side, without symptoms of upper respiratory infection or recent water exposure. Exam reveals fluid in the right ear without signs of infection, consistent with otitis media with effusion due to likely eustachian tube dysfunction. Hearing is grossly intact. Patient was advised that this is not an ear infection and typically resolves naturally. Treatment includes a nasal spray, over-the-counter decongestant, and warm compresses for symptomatic relief.  Patient also reports lower back and stomach pain with associated nausea and foul-smelling urine over the past 2-3 days. Although dysuria is absent, urinalysis revealed cloudy urine with trace leukocytes, consistent with a urinary tract infection. An antibiotic was prescribed with a urine culture ordered to guide further treatment if needed. Patient was advised that they will be contacted if the culture results indicate a change in antibiotic is necessary. Nausea is presumed secondary to the UTI and was treated with an anti-nausea medication. Patient was instructed to follow up with their PCP if symptoms persist or worsen and to seek emergency care for high fever, vomiting, severe abdominal or back pain, or signs of systemic illness.  Today's evaluation  has revealed no signs of a dangerous process. Discussed diagnosis with patient and/or guardian. Patient and/or guardian aware of their diagnosis, possible red flag symptoms to watch out for and need for close follow up. Patient and/or guardian understands verbal and written discharge instructions. Patient and/or guardian comfortable with plan and disposition.  Patient and/or guardian has a clear mental status at this time, good insight into illness (after discussion and teaching) and has clear judgment to make decisions regarding their care  Documentation was completed with the aid of voice recognition software. Transcription may contain typographical errors. Final Clinical Impressions(s) / UC Diagnoses   Final diagnoses:  Urinary frequency  OME (otitis media with effusion), right  Nausea without vomiting     Discharge Instructions      You were seen today for ear pain, stomach discomfort, and nausea. Fluid was found in your right ear without signs of infection, consistent with otitis media with effusion. This is not an ear infection and usually resolves on its own. You have been prescribed a decongestant and nasal spray to help with drainage. Applying a warm, moist cloth or low-setting heating pad to the affected ear may help relieve discomfort.  You were also diagnosed with a urinary tract infection based on your symptoms and urinalysis results. An antibiotic was prescribed to treat the infection, and a urine culture was sent to identify the specific bacteria. If the results suggest a different antibiotic is needed, you will be contacted. You were also given medication to help with nausea, which is likely related to the UTI.  Drink fluids as tolerated unless otherwise advised, rest, and take medications exactly as prescribed. Follow up with your primary care provider if your symptoms do not improve in a few days or if they return after treatment. Go to the emergency department if you experience  high fever, worsening back or abdominal pain, vomiting that doesn't stop, confusion, or signs of a spreading infection.      ED Prescriptions     Medication Sig Dispense Auth. Provider   ondansetron  (ZOFRAN -ODT) 8 MG disintegrating tablet Take 1 tablet (8 mg total) by mouth every 8 (eight) hours as needed for nausea or vomiting. 12 tablet Iola Lukes, FNP  cetirizine -pseudoephedrine (ZYRTEC -D) 5-120 MG tablet Take 1 tablet by mouth daily. 10 tablet Iola Lukes, FNP   nitrofurantoin , macrocrystal-monohydrate, (MACROBID ) 100 MG capsule Take 1 capsule (100 mg total) by mouth 2 (two) times daily. 10 capsule Iola Lukes, FNP   fluticasone  (FLONASE ) 50 MCG/ACT nasal spray Place 2 sprays into both nostrils daily. Shake well before use. Gently blow nose before spraying. Do not blow nose immediately after use. You should not taste the medication or feel it going down your throat; if you do, adjust your technique. 16 g Iola Lukes, FNP      PDMP not reviewed this encounter.   Iola Dandridge, [place] 12/02/23 [phone]

## 2023-12-02 NOTE — Discharge Instructions (Addendum)
 You were seen today for ear pain, stomach discomfort, and nausea. Fluid was found in your right ear without signs of infection, consistent with otitis media with effusion. This is not an ear infection and usually resolves on its own. You have been prescribed a decongestant and nasal spray to help with drainage. Applying a warm, moist cloth or low-setting heating pad to the affected ear may help relieve discomfort.  You were also diagnosed with a urinary tract infection based on your symptoms and urinalysis results. An antibiotic was prescribed to treat the infection, and a urine culture was sent to identify the specific bacteria. If the results suggest a different antibiotic is needed, you will be contacted. You were also given medication to help with nausea, which is likely related to the UTI.  Drink fluids as tolerated unless otherwise advised, rest, and take medications exactly as prescribed. Follow up with your primary care provider if your symptoms do not improve in a few days or if they return after treatment. Go to the emergency department if you experience high fever, worsening back or abdominal pain, vomiting that doesn't stop, confusion, or signs of a spreading infection.

## 2023-12-03 ENCOUNTER — Encounter (HOSPITAL_BASED_OUTPATIENT_CLINIC_OR_DEPARTMENT_OTHER): Payer: Self-pay | Admitting: Physical Therapy

## 2023-12-03 ENCOUNTER — Ambulatory Visit (HOSPITAL_BASED_OUTPATIENT_CLINIC_OR_DEPARTMENT_OTHER): Admitting: Physical Therapy

## 2023-12-03 DIAGNOSIS — M25372 Other instability, left ankle: Secondary | ICD-10-CM

## 2023-12-03 DIAGNOSIS — G5793 Unspecified mononeuropathy of bilateral lower limbs: Secondary | ICD-10-CM | POA: Diagnosis not present

## 2023-12-03 DIAGNOSIS — M19172 Post-traumatic osteoarthritis, left ankle and foot: Secondary | ICD-10-CM

## 2023-12-03 NOTE — Therapy (Signed)
 OUTPATIENT PHYSICAL THERAPY TREATMENT   Patient Name: Karen Brennan MRN: [id number] DOB:1976-01-21, 48 y.o., female Today's Date: 12/03/2023  END OF SESSION:  PT End of Session - 12/03/23 1454     Visit Number 5    Number of Visits 13    Date for PT Re-Evaluation 12/14/23    Authorization Type [employer] MCD    PT Start Time 1448    PT Stop Time 1528    PT Time Calculation (min) 40 min    Activity Tolerance Patient tolerated treatment well    Behavior During Therapy [facility] for tasks assessed/performed          Past Medical History:  Diagnosis Date   ANEMIA 08/14/2008   Qualifier: Diagnosis of  By: Joyice MD, Jessica     Anxiety    Asthma    B12 deficiency 08/29/2021   Bipolar 1 disorder (HCC)    Blood transfusion    Cellulitis 07/15/2020   Of right buttock   Chest pain    Depression    Edema of both lower extremities    GERD (gastroesophageal reflux disease)    History of bronchitis    Hypertension    Kidney stones 10/09/2011   Migraines 10/09/2011   often   Morbid obesity with BMI of 50.0-59.9, adult (HCC) 08/09/2006   Qualifier: Diagnosis of   By: Joyice MD, Jessica         Pre-diabetes    Schizophrenia (HCC)    SOB (shortness of breath)    Vitamin D  deficiency 08/29/2021   Past Surgical History:  Procedure Laterality Date   ANKLE FRACTURE SURGERY   1990's   left; put screws in   ENDOMETRIAL ABLATION  ~ 2011   FRACTURE SURGERY     GASTRIC BY PASS     TUBAL LIGATION  2006   Patient Active Problem List   Diagnosis Date Noted   Severe dysplasia of cervix (CIN III) 08/20/2023   Open wound of foot, right, initial encounter 06/25/2023   Plantar fasciitis 06/25/2023   BMI 45.0-49.9, adult (HCC)-current bmi 46.8 07/12/2022   Chronic constipation 06/14/2022   Seasonal allergies 05/01/2022   Hyperlipidemia associated with type 2 diabetes mellitus (HCC) 12/05/2021   Type II diabetes mellitus (HCC) 08/29/2021   PVC's (premature ventricular contractions)  07/19/2021   Intertrigo 02/16/2020   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 Include strengthening exercises and aquatic therapy in treatment plan.   Rationale for Evaluation and Treatment: Rehabilitation  THERAPY DIAG:  Instability of left ankle joint  Post-traumatic osteoarthritis of left ankle  ONSET DATE: 2-3 mo ago (around Feb 2025) acute on chronic   SUBJECTIVE:  SUBJECTIVE STATEMENT: No pain  POOL ACCESS:  [employer]  From initial evaluation: Broke my ankle when I was a teenager, hardware removed in the past. It swells really bad. I usually wear compression socks and ASO with tennis shoes. Sometimes limited in shoe wear by ankle edema. Two falls in the last 2 months due to dizziness, blacked out and fell on knees. On BP meds- changing due to recent weight loss.   PERTINENT HISTORY:  Peripheral neuropathy- reports occasional sensation of pressure of floor on foot, gabapentin  for management.   PAIN:  Are you having pain? Yes: NPRS scale: 5/10 Pain location: L ankle and lower leg  Pain description: tight, tender Aggravating factors: standing too long Relieving factors: ibuprofen , tylenol   PRECAUTIONS:   Fall  RED FLAGS: None   WEIGHT BEARING RESTRICTIONS:  No  FALLS:  Has patient fallen in last 6 months? Yes. Number of falls 5  LIVING ENVIRONMENT: Lives with: lives with their daughter x3 Lives in: House/apartment- no stairs  OCCUPATION:  On disability  PLOF:  Sometimes needs help with bathing and getting off the couch  PATIENT GOALS:  Working on weight loss   OBJECTIVE:  Note: Objective measures were completed at Evaluation unless otherwise noted.  DIAGNOSTIC FINDINGS:  WFL  PATIENT SURVEYS:  LEFS  Extreme difficulty/unable (0), Quite a bit of difficulty (1), Moderate difficulty (2), Little difficulty (3), No difficulty (4) Survey date:  5/23  Any of your usual work, housework or school activities 1  2. Usual hobbies, recreational or sporting activities 0  3. Getting into/out of the bath 0  4. Walking between rooms 1  5. Putting on socks/shoes 1  6. Squatting  0  7. Lifting an object, like a bag of groceries from the floor 1  8. Performing light activities around your home 1  9. Performing heavy activities around your home 0  10. Getting into/out of a car 0  11. Walking 2 blocks 0  12. Walking 1 mile 0  13. Going up/down 10 stairs (1 flight) 0  14. Standing for 1 hour 0  15.  sitting for 1 hour 0  16. Running on even ground 0  17. Running on uneven ground 0  18. Making sharp turns while running fast 0  19. Hopping  0  20. Rolling over in bed 1  Score total:  6     COGNITIVE STATUS: Within functional limits for tasks assessed   SENSATION: Limited due to peripheral neuropathy in bilat feet  EDEMA:  Yes: Lt lateral maleolus   GAIT: Eval: flexed posture, lacking DF at heel strike bilat, antalgic, slow cadence.    Body Part #1 Ankle  EVAL ankle AROM DF: knee flexed-3, knee ext -10  Able to stand with DF at -3 deg, unable to demo standing toe raise  FUNCTIONAL TESTS:  Berg Balance Scale:  Item Test date: 5/23EVAL Test date:  Test date:    Sitting to standing 1. needs minimal aid to stand or stabilize Insert OPRCBERGREEVAL SmartPhrase at re-test date Insert OPRCBERGREEVAL SmartPhrase at re-test date  2. Standing unsupported 4. able to stand safely for 2 minutes    3. Sitting with back unsupported, feet supported 4. able to sit safely and securely for 2 minutes    4. Standing to sitting 1. sits independently but has uncontrolled descent    5. Pivot transfer  3. able to transfer safely with definite need of hands    6. Standing unsupported with eyes closed 4. able  to stand 10 seconds safely    7. Standing unsupported with feet together 4. able to place feet together independently and stand 1 minute safely    8. Reaching forward with outstretched arms while standing 3. can reach forward 12 cm (5 inches)    9. Pick up object from the floor from standing 3. able to pick up slipper but needs supervision    10. Turning to look behind over left and right shoulders while standing 3. looks behind one side only, other side shows less weight shift    11. Turn 360 degrees 2. able to turn 360 degrees safely but slowly    12. Place alternate foot on step or stool while standing unsupported 2. able to complete 4 steps without aid with supervision    13. Standing unsupported one foot in front 3. able to place foot ahead independently and hold 30 seconds    14. Standing on one leg 1. tries to lift leg unable to hold 3 seconds but remains standing independently.      Total Score 38/56 Total Score /56 Total Score /56                                                                                                                                 TREATMENT DATE:  [facility] Adult PT Treatment:                                             11/29/23 Pt seen for aquatic therapy today.  Treatment took place in water 3.5-4.75 ft in depth at the [facility] pool. Temp of water was 91.  Pt entered/exited the pool via stairs independently in step-to pattern  with bilat rail.  - unsupported walking forward/ backward (in deepest water) - cues for heel/toe and toe/heel, multiple laps - unsupported side stepping  -toe walking forward/back - bilat knee to chest stretch with feet in bottom ladder hole, UE on rails x 20s, then gentle hip swing - side stepping with arm addct/ abdct with rainbow hand floats - suitcase carry with bilat rainbow ->yellow hand floats at sides walking backward -L stretch - knee to chest stretch at ladder -Cycling with noodle wrapped posteriorly across chest (difficult for pt) - UE on rainbow ->yellow hand floats - Tandem stance  (good challenge) -UE support yellow HB 3 way touch; hip flex/ext - tandem gait forward/ backward, UE on yellow hand floats  Pt requires the buoyancy and hydrostatic pressure of water for support, and to offload joints by unweighting joint load by at least 50 % in navel deep water and by at least 75-80% in chest to neck deep water.  Viscosity of the water is needed for resistance of strengthening. Water current perturbations provides challenge to standing balance requiring increased core activation.  PATIENT EDUCATION:  Education details: intro to aquatic therapy  Person educated: Patient Education method: Explanation, Demonstration, Tactile cues, and Verbal cues Education comprehension: verbalized understanding, returned demonstration, verbal cues required, tactile cues required, and needs further education  HOME EXERCISE PROGRAM: To be established at pool   ASSESSMENT:  CLINICAL IMPRESSION: Focus on ankle movement/strength through exercise and balance. She demonstrates increased success with heavier hand buoys (for balance) pulling in increased ankle strategy for engagement.  She does report increased in LB discomfort with exercises which reduce with stretching.      From initial evaluation:  Patient is a 48 y.o. F who was seen today for physical therapy evaluation and treatment  for Lt ankle pain- subacute on chronic onset. Pt has recently lost 130lb and has very limited central stability leading to distal pain and lack of control. Able to ambulate but lacks DF which puts her at a trip risk. Pt will benefit significantly from aquatic therapy for gross biomechanical chain strengthening and endurance challenge. I did introduce her to aquatics today and walked her back to the pool.     OBJECTIVE IMPAIRMENTS: Abnormal gait, decreased activity tolerance, decreased balance, decreased coordination, decreased mobility, difficulty walking, decreased ROM, decreased strength, dizziness, increased edema, impaired flexibility, impaired sensation, improper body mechanics, obesity, and pain.   ACTIVITY LIMITATIONS: standing, squatting, stairs, transfers, bed mobility, bathing, toileting, hygiene/grooming, and locomotion level  PARTICIPATION LIMITATIONS: cleaning, laundry, shopping, and community activity  PERSONAL FACTORS: obesity & weight changes, blood pressure changes/medication management, peripheral neuropathy, recent falls are also affecting patient's functional outcome.   REHAB POTENTIAL: Good  CLINICAL DECISION MAKING: Unstable/unpredictable  EVALUATION COMPLEXITY: High   GOALS: Goals reviewed with patient? Yes  SHORT TERM GOALS: Target date: 6/13  Verbalize awareness of core engagement in exercises Baseline: will progress as appropriate Goal status: Met 11/26/23  2.  Able to navigate stairs into/out of pool with proper, reciprocal form Baseline: will progress as appropriate Goal status: IN PROGRESS- 11/28/23  (step-to pattern in; step through pattern out)   LONG TERM GOALS: Target date: POC date  BERG to improve by MDC Baseline: see obj Goal status: INITIAL  2.  LEFS to improve by MDC Baseline: see obj Goal status: INITIAL  3.  Able to stand from standard chair without UEs Baseline: unable at eval Goal status: INITIAL  4.   Active DF to, at least,  neutral with knee extended Baseline: see obj Goal status: INITIAL    PLAN:  PT FREQUENCY: 1-2x/week  PT DURATION: POC date  PLANNED INTERVENTIONS: 97164- PT Re-evaluation, 97750- Physical Performance Testing, 97110-Therapeutic exercises, 97530- Therapeutic activity, [id number]- Neuromuscular re-education, 97535- Self Care, [id number]- Manual therapy, [phone]- Gait training, [phone]- Aquatic Therapy, Patient/Family education, Balance training, Stair training, Taping, Dry Needling, Joint mobilization, Spinal mobilization, Cryotherapy, and Moist heat.  PLAN FOR NEXT SESSION: continue aquatic therapy.    Ronal Escudilla Bonita) Airyanna Dipalma MPT 12/03/23 2:56 PM [facility] [address] [place], [place], [address] Phone: [phone]   Fax:  [phone]    For all possible CPT codes, reference the Planned Interventions line above.     Check all conditions that are expected to impact treatment: {Conditions expected to impact treatment:Morbid obesity and Neurological condition and/or seizures   If treatment provided at initial evaluation, no treatment charged due to lack of authorization.

## 2023-12-04 ENCOUNTER — Ambulatory Visit (HOSPITAL_COMMUNITY): Payer: Self-pay

## 2023-12-04 LAB — URINE CULTURE: Culture: >=100000 — AB

## 2023-12-05 ENCOUNTER — Ambulatory Visit: Admitting: Sports Medicine

## 2023-12-05 DIAGNOSIS — G5793 Unspecified mononeuropathy of bilateral lower limbs: Secondary | ICD-10-CM | POA: Diagnosis not present

## 2023-12-06 ENCOUNTER — Ambulatory Visit (HOSPITAL_BASED_OUTPATIENT_CLINIC_OR_DEPARTMENT_OTHER): Admitting: Physical Therapy

## 2023-12-06 ENCOUNTER — Encounter (HOSPITAL_BASED_OUTPATIENT_CLINIC_OR_DEPARTMENT_OTHER): Payer: Self-pay | Admitting: Physical Therapy

## 2023-12-06 DIAGNOSIS — M25372 Other instability, left ankle: Secondary | ICD-10-CM | POA: Diagnosis not present

## 2023-12-06 DIAGNOSIS — M19172 Post-traumatic osteoarthritis, left ankle and foot: Secondary | ICD-10-CM

## 2023-12-06 NOTE — Therapy (Signed)
 OUTPATIENT PHYSICAL THERAPY TREATMENT   Patient Name: Karen Brennan MRN: [id number] DOB:1975-08-06, 48 y.o., female Today's Date: 12/06/2023  END OF SESSION:  PT End of Session - 12/06/23 0848     Visit Number 6    Number of Visits 13    Date for PT Re-Evaluation 12/14/23    Authorization Type [employer] MCD    PT Start Time 0845    PT Stop Time 0925    PT Time Calculation (min) 40 min    Activity Tolerance Patient tolerated treatment well    Behavior During Therapy [facility] for tasks assessed/performed          Past Medical History:  Diagnosis Date   ANEMIA 08/14/2008   Qualifier: Diagnosis of  By: Joyice MD, Jessica     Anxiety    Asthma    B12 deficiency 08/29/2021   Bipolar 1 disorder (HCC)    Blood transfusion    Cellulitis 07/15/2020   Of right buttock   Chest pain    Depression    Edema of both lower extremities    GERD (gastroesophageal reflux disease)    History of bronchitis    Hypertension    Kidney stones 10/09/2011   Migraines 10/09/2011   often   Morbid obesity with BMI of 50.0-59.9, adult (HCC) 08/09/2006   Qualifier: Diagnosis of   By: Joyice MD, Jessica         Pre-diabetes    Schizophrenia (HCC)    SOB (shortness of breath)    Vitamin D  deficiency 08/29/2021   Past Surgical History:  Procedure Laterality Date   ANKLE FRACTURE SURGERY   1990's   left; put screws in   ENDOMETRIAL ABLATION  ~ 2011   FRACTURE SURGERY     GASTRIC BY PASS     TUBAL LIGATION  2006   Patient Active Problem List   Diagnosis Date Noted   Severe dysplasia of cervix (CIN III) 08/20/2023   Open wound of foot, right, initial encounter 06/25/2023   Plantar fasciitis 06/25/2023   BMI 45.0-49.9, adult (HCC)-current bmi 46.8 07/12/2022   Chronic constipation 06/14/2022   Seasonal allergies 05/01/2022   Hyperlipidemia associated with type 2 diabetes mellitus (HCC) 12/05/2021   Type II diabetes mellitus (HCC) 08/29/2021   PVC's (premature ventricular contractions)  07/19/2021   Intertrigo 02/16/2020   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 Include strengthening exercises and aquatic therapy in treatment plan.   Rationale for Evaluation and Treatment: Rehabilitation  THERAPY DIAG:  Instability of left ankle joint  Post-traumatic osteoarthritis of left ankle  ONSET DATE: 2-3 mo ago (around Feb 2025) acute on chronic   SUBJECTIVE:  SUBJECTIVE STATEMENT: Up a lot last night with dtr tired and pain increased in ankle knee and back  POOL ACCESS:  [employer]  From initial evaluation: Broke my ankle when I was a teenager, hardware removed in the past. It swells really bad. I usually wear compression socks and ASO with tennis shoes. Sometimes limited in shoe wear by ankle edema. Two falls in the last 2 months due to dizziness, blacked out and fell on knees. On BP meds- changing due to recent weight loss.   PERTINENT HISTORY:  Peripheral neuropathy- reports occasional sensation of pressure of floor on foot, gabapentin  for management.   PAIN:  Are you having pain? Yes: NPRS scale: 6/10 Pain location: L ankle and lower leg  Pain description: tight, tender Aggravating factors: standing too  long Relieving factors: ibuprofen , tylenol   PRECAUTIONS:  Fall  RED FLAGS: None   WEIGHT BEARING RESTRICTIONS:  No  FALLS:  Has patient fallen in last 6 months? Yes. Number of falls 5  LIVING ENVIRONMENT: Lives with: lives with their daughter x3 Lives in: House/apartment- no stairs  OCCUPATION:  On disability  PLOF:  Sometimes needs help with bathing and getting off the couch  PATIENT GOALS:  Working on weight loss   OBJECTIVE:  Note: Objective measures were completed at Evaluation unless otherwise noted.  DIAGNOSTIC FINDINGS:  WFL  PATIENT SURVEYS:  LEFS  Extreme difficulty/unable (0), Quite a bit of difficulty (1), Moderate difficulty (2), Little difficulty (3), No difficulty (4) Survey date:  5/23  Any of your usual work, housework or school activities 1  2. Usual hobbies, recreational or sporting activities 0  3. Getting into/out of the bath 0  4. Walking between rooms 1  5. Putting on socks/shoes 1  6. Squatting  0  7. Lifting an object, like a bag of groceries from the floor 1  8. Performing light activities around your home 1  9. Performing heavy activities around your home 0  10. Getting into/out of a car 0  11. Walking 2 blocks 0  12. Walking 1 mile 0  13. Going up/down 10 stairs (1 flight) 0  14. Standing for 1 hour 0  15.  sitting for 1 hour 0  16. Running on even ground 0  17. Running on uneven ground 0  18. Making sharp turns while running fast 0  19. Hopping  0  20. Rolling over in bed 1  Score total:  6     COGNITIVE STATUS: Within functional limits for tasks assessed   SENSATION: Limited due to peripheral neuropathy in bilat feet  EDEMA:  Yes: Lt lateral maleolus   GAIT: Eval: flexed posture, lacking DF at heel strike bilat, antalgic, slow cadence.    Body Part #1 Ankle  EVAL ankle AROM DF: knee flexed-3, knee ext -10  Able to stand with DF at -3 deg, unable to demo standing toe raise  FUNCTIONAL TESTS:  Berg Balance  Scale:  Item Test date: 5/23EVAL Test date:  Test date:   Sitting to standing 1. needs minimal aid to stand or stabilize Insert OPRCBERGREEVAL SmartPhrase at re-test date Insert OPRCBERGREEVAL SmartPhrase at re-test date  2. Standing unsupported 4. able to stand safely for 2 minutes    3. Sitting with back unsupported, feet supported 4. able to sit safely and securely for 2 minutes    4. Standing to sitting 1. sits independently but has uncontrolled descent    5. Pivot transfer  3. able to transfer safely with definite  need of hands    6. Standing unsupported with eyes closed 4. able to stand 10 seconds safely    7. Standing unsupported with feet together 4. able to place feet together independently and stand 1 minute safely    8. Reaching forward with outstretched arms while standing 3. can reach forward 12 cm (5 inches)    9. Pick up object from the floor from standing 3. able to pick up slipper but needs supervision    10. Turning to look behind over left and right shoulders while standing 3. looks behind one side only, other side shows less weight shift    11. Turn 360 degrees 2. able to turn 360 degrees safely but slowly    12. Place alternate foot on step or stool while standing unsupported 2. able to complete 4 steps without aid with supervision    13. Standing unsupported one foot in front 3. able to place foot ahead independently and hold 30 seconds    14. Standing on one leg 1. tries to lift leg unable to hold 3 seconds but remains standing independently.      Total Score 38/56 Total Score /56 Total Score /56                                                                                                                                 TREATMENT DATE:  [facility] Adult PT Treatment:                                             12/06/23 Pt seen for aquatic therapy today.  Treatment took place in water 3.5-4.75 ft in depth at the [facility] pool. Temp of water was 91.  Pt  entered/exited the pool via stairs independently in step-to pattern with bilat rail.  - unsupported walking forward/ backward (in deepest water) - cues for heel/toe and toe/heel, multiple laps - unsupported side stepping  -toe walking forward/back -heel walking forward and back - bilat knee to chest stretch with feet in bottom ladder hole, UE on rails x 20s, then gentle hip swing->straight knees for hamstring and gastroc stretch -Hip hinges 2 x 5 -STS from bench onto pool floor vc and for slowed eccentric control to sitting as well as hinge->bench to water step (difficult) -Cycling with noodle wrapped posteriorly across chest (difficult for pt)   Pt requires the buoyancy and hydrostatic pressure of water for support, and to offload joints by unweighting joint load by at least 50 % in navel deep water and by at least 75-80% in chest to neck deep water.  Viscosity of the water is needed for resistance of strengthening. Water current perturbations provides challenge to standing balance requiring increased core activation.  * once dried off applied reg rock tape to L dorsum of foot, and ankle in  2 stirrups, one crossing at talus to provide increased proprioception. Pt instructed in safe tape removal technique; verbalized understanding.       PATIENT EDUCATION:  Education details: intro to aquatic therapy  Person educated: Patient Education method: Explanation, Demonstration, Tactile cues, and Verbal cues Education comprehension: verbalized understanding, returned demonstration, verbal cues required, tactile cues required, and needs further education  HOME EXERCISE PROGRAM: To be established at pool   ASSESSMENT:  CLINICAL IMPRESSION: Worked on STS transfers. Required instruction and completion on hip hinging for improved execution of STS transfer.  VC, demonstration and TC provided with hip hinging which she is able to translate nicely for completion STS from bench onto pool floor.   Increased challenge with rising onto water bench. Goals ongoing    From initial evaluation:  Patient is a 48 y.o. F who was seen today for physical therapy evaluation and treatment for Lt ankle pain- subacute on chronic onset. Pt has recently lost 130lb and has very limited central stability leading to distal pain and lack of control. Able to ambulate but lacks DF which puts her at a trip risk. Pt will benefit significantly from aquatic therapy for gross biomechanical chain strengthening and endurance challenge. I did introduce her to aquatics today and walked her back to the pool.     OBJECTIVE IMPAIRMENTS: Abnormal gait, decreased activity tolerance, decreased balance, decreased coordination, decreased mobility, difficulty walking, decreased ROM, decreased strength, dizziness, increased edema, impaired flexibility, impaired sensation, improper body mechanics, obesity, and pain.   ACTIVITY LIMITATIONS: standing, squatting, stairs, transfers, bed mobility, bathing, toileting, hygiene/grooming, and locomotion level  PARTICIPATION LIMITATIONS: cleaning, laundry, shopping, and community activity  PERSONAL FACTORS: obesity & weight changes, blood pressure changes/medication management, peripheral neuropathy, recent falls are also affecting patient's functional outcome.   REHAB POTENTIAL: Good  CLINICAL DECISION MAKING: Unstable/unpredictable  EVALUATION COMPLEXITY: High   GOALS: Goals reviewed with patient? Yes  SHORT TERM GOALS: Target date: 6/13  Verbalize awareness of core engagement in exercises Baseline: will progress as appropriate Goal status: Met 11/26/23  2.  Able to navigate stairs into/out of pool with proper, reciprocal form Baseline: will progress as appropriate Goal status: IN PROGRESS- 11/28/23  (step-to pattern in; step through pattern out)   LONG TERM GOALS: Target date: POC date  BERG to improve by MDC Baseline: see obj Goal status: INITIAL  2.  LEFS to improve  by MDC Baseline: see obj Goal status: INITIAL  3.  Able to stand from standard chair without UEs Baseline: unable at eval Goal status: INITIAL  4.   Active DF to, at least, neutral with knee extended Baseline: see obj Goal status: INITIAL    PLAN:  PT FREQUENCY: 1-2x/week  PT DURATION: POC date  PLANNED INTERVENTIONS: 97164- PT Re-evaluation, 97750- Physical Performance Testing, 97110-Therapeutic exercises, 97530- Therapeutic activity, [id number]- Neuromuscular re-education, 97535- Self Care, [id number]- Manual therapy, [phone]- Gait training, [phone]- Aquatic Therapy, Patient/Family education, Balance training, Stair training, Taping, Dry Needling, Joint mobilization, Spinal mobilization, Cryotherapy, and Moist heat.  PLAN FOR NEXT SESSION: continue aquatic therapy.    Ronal Morning Sun) Amauria Younts MPT 12/06/23 8:51 AM [facility] [address] [place], [place], [address] Phone: [phone]   Fax:  [phone]    For all possible CPT codes, reference the Planned Interventions line above.     Check all conditions that are expected to impact treatment: {Conditions expected to impact treatment:Morbid obesity and Neurological condition and/or seizures   If treatment provided at initial evaluation, no  treatment charged due to lack of authorization.

## 2023-12-07 DIAGNOSIS — G5793 Unspecified mononeuropathy of bilateral lower limbs: Secondary | ICD-10-CM | POA: Diagnosis not present

## 2023-12-10 ENCOUNTER — Encounter (HOSPITAL_BASED_OUTPATIENT_CLINIC_OR_DEPARTMENT_OTHER): Payer: Self-pay | Admitting: Physical Therapy

## 2023-12-10 ENCOUNTER — Ambulatory Visit (HOSPITAL_BASED_OUTPATIENT_CLINIC_OR_DEPARTMENT_OTHER): Admitting: Physical Therapy

## 2023-12-10 DIAGNOSIS — M25372 Other instability, left ankle: Secondary | ICD-10-CM

## 2023-12-10 DIAGNOSIS — M19172 Post-traumatic osteoarthritis, left ankle and foot: Secondary | ICD-10-CM

## 2023-12-10 DIAGNOSIS — G5793 Unspecified mononeuropathy of bilateral lower limbs: Secondary | ICD-10-CM | POA: Diagnosis not present

## 2023-12-10 NOTE — Therapy (Signed)
 OUTPATIENT PHYSICAL THERAPY TREATMENT   Patient Name: Karen Brennan MRN: [id number] DOB:1975/07/25, 48 y.o., female Today's Date: 12/10/2023  END OF SESSION:  PT End of Session - 12/10/23 0940     Visit Number 7    Number of Visits 13    Date for PT Re-Evaluation 12/14/23    Authorization Type [employer] MCD    PT Start Time 0930    PT Stop Time 1008    PT Time Calculation (min) 38 min    Behavior During Therapy [facility] for tasks assessed/performed          Past Medical History:  Diagnosis Date   ANEMIA 08/14/2008   Qualifier: Diagnosis of  By: Joyice MD, Jessica     Anxiety    Asthma    B12 deficiency 08/29/2021   Bipolar 1 disorder (HCC)    Blood transfusion    Cellulitis 07/15/2020   Of right buttock   Chest pain    Depression    Edema of both lower extremities    GERD (gastroesophageal reflux disease)    History of bronchitis    Hypertension    Kidney stones 10/09/2011   Migraines 10/09/2011   often   Morbid obesity with BMI of 50.0-59.9, adult (HCC) 08/09/2006   Qualifier: Diagnosis of   By: Joyice MD, Jessica         Pre-diabetes    Schizophrenia (HCC)    SOB (shortness of breath)    Vitamin D  deficiency 08/29/2021   Past Surgical History:  Procedure Laterality Date   ANKLE FRACTURE SURGERY   1990's   left; put screws in   ENDOMETRIAL ABLATION  ~ 2011   FRACTURE SURGERY     GASTRIC BY PASS     TUBAL LIGATION  2006   Patient Active Problem List   Diagnosis Date Noted   Severe dysplasia of cervix (CIN III) 08/20/2023   Open wound of foot, right, initial encounter 06/25/2023   Plantar fasciitis 06/25/2023   BMI 45.0-49.9, adult (HCC)-current bmi 46.8 07/12/2022   Chronic constipation 06/14/2022   Seasonal allergies 05/01/2022   Hyperlipidemia associated with type 2 diabetes mellitus (HCC) 12/05/2021   Type II diabetes mellitus (HCC) 08/29/2021   PVC's (premature ventricular contractions) 07/19/2021   Intertrigo 02/16/2020   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 Include strengthening exercises and aquatic therapy in treatment plan.   Rationale for Evaluation and Treatment: Rehabilitation  THERAPY DIAG:  Instability of left ankle joint  Post-traumatic osteoarthritis of left ankle  ONSET DATE: 2-3 mo ago (around Feb 2025) acute on chronic   SUBJECTIVE:  SUBJECTIVE STATEMENT: Pt reports she had increased pain after last visit.  Wore her boot, used ice on ankle and alternated between tylenol  and ibuprofen  for pain relief.  Kept tape on for 4 day; continues to provide relief.  Hasn't made it to [facility] yet; looking for adult swim diapers for her daughter.   POOL ACCESS:  [employer]  From initial evaluation: Broke my ankle when I was a teenager, hardware removed in the past. It swells really bad. I usually wear compression socks and ASO with tennis shoes. Sometimes limited in shoe wear by ankle edema. Two falls in the last 2 months due to dizziness, blacked out and fell on knees. On BP meds- changing due to recent weight loss.   PERTINENT HISTORY:  Peripheral neuropathy- reports occasional sensation of pressure of floor on foot, gabapentin  for management.   PAIN:  Are you  having pain? Yes: NPRS scale: 6/10 Pain location: L ankle and lower leg  Pain description: tight, tender Aggravating factors: standing too long Relieving factors: ibuprofen , tylenol   PRECAUTIONS:  Fall  RED FLAGS: None   WEIGHT BEARING RESTRICTIONS:  No  FALLS:  Has patient fallen in last 6 months? Yes. Number of falls 5  LIVING ENVIRONMENT: Lives with: lives with their daughter x3 Lives in: House/apartment- no stairs  OCCUPATION:  On disability  PLOF:  Sometimes needs help with bathing and getting off the couch  PATIENT GOALS:  Working on weight loss   OBJECTIVE:  Note: Objective measures were completed at Evaluation unless otherwise noted.  DIAGNOSTIC FINDINGS:  WFL  PATIENT SURVEYS:  LEFS  Extreme difficulty/unable (0), Quite a bit of difficulty (1), Moderate difficulty (2), Little difficulty (3), No difficulty (4) Survey date:  5/23  Any of your usual work, housework or school activities 1  2. Usual hobbies, recreational or sporting activities 0  3. Getting into/out of the bath 0  4. Walking between rooms 1  5. Putting on socks/shoes 1  6. Squatting  0  7. Lifting an object, like a bag of groceries from the floor 1  8. Performing light activities around your home 1  9. Performing heavy activities around your home 0  10. Getting into/out of a car 0  11. Walking 2 blocks 0  12. Walking 1 mile 0  13. Going up/down 10 stairs (1 flight) 0  14. Standing for 1 hour 0  15.  sitting for 1 hour 0  16. Running on even ground 0  17. Running on uneven ground 0  18. Making sharp turns while running fast 0  19. Hopping  0  20. Rolling over in bed 1  Score total:  6     COGNITIVE STATUS: Within functional limits for tasks assessed   SENSATION: Limited due to peripheral neuropathy in bilat feet  EDEMA:  Yes: Lt lateral maleolus   GAIT: Eval: flexed posture, lacking DF at heel strike bilat, antalgic, slow cadence.    Body Part #1 Ankle  EVAL ankle  AROM DF: knee flexed-3, knee ext -10  Able to stand with DF at -3 deg, unable to demo standing toe raise  12/10/23:  L ankle DF with knee ext -10  FUNCTIONAL TESTS:  Lars Balance Scale:  Item Test date: 5/23EVAL Test date:  Test date:   Sitting to standing 1. needs minimal aid to stand or stabilize Insert OPRCBERGREEVAL SmartPhrase at re-test date Insert OPRCBERGREEVAL SmartPhrase at re-test date  2. Standing unsupported 4. able to stand safely for 2 minutes  3. Sitting with back unsupported, feet supported 4. able to sit safely and securely for 2 minutes    4. Standing to sitting 1. sits independently but has uncontrolled descent    5. Pivot transfer  3. able to transfer safely with definite need of hands    6. Standing unsupported with eyes closed 4. able to stand 10 seconds safely    7. Standing unsupported with feet together 4. able to place feet together independently and stand 1 minute safely    8. Reaching forward with outstretched arms while standing 3. can reach forward 12 cm (5 inches)    9. Pick up object from the floor from standing 3. able to pick up slipper but needs supervision    10. Turning to look behind over left and right shoulders while standing 3. looks behind one side only, other side shows less weight shift    11. Turn 360 degrees 2. able to turn 360 degrees safely but slowly    12. Place alternate foot on step or stool while standing unsupported 2. able to complete 4 steps without aid with supervision    13. Standing unsupported one foot in front 3. able to place foot ahead independently and hold 30 seconds    14. Standing on one leg 1. tries to lift leg unable to hold 3 seconds but remains standing independently.      Total Score 38/56 Total Score /56 Total Score /56                                                                                                                                 TREATMENT DATE:  [facility] Adult PT Treatment:                                              12/10/23 Pt seen for aquatic therapy today.  Treatment took place in water 3.5-4.75 ft in depth at the [facility] pool. Temp of water was 91.  Pt entered/exited the pool via stairs independently in step-to pattern with bilat rail.  - unsupported walking forward/ backward (in deepest water) - cues for heel/toe and toe/heel, multiple laps - unsupported side stepping  - UE on wall:  heel raises x 20; 3 way LE kick x 10 each LE - UE on yellow hand floats:  tandem gait forward/ backward -STS from bench onto pool floor vc for slowed eccentric control to sitting, core engaged, neutral head x 10 - Up/down 3 steps with BUE on rails in reciprocal pattern x 4 reps  - bilat heels off of 1st step for gastroc stretch - R/L forward lunge stretch for soleus stretch (on step) and gastroc stretch (on pool floor) x 30s x 2 each  -return to walking forward backward with heel/toe, toe/heel pattern  - bilat knee to  chest stretch with feet in bottom ladder hole, UE on rails x 20s, ->straight knees for hamstring and gastroc stretch  * once dried off applied dynamic tape to L dorsum of foot, and ankle in 2 stirrups, one crossing at talus to provide increased proprioception. Pt instructed in safe tape removal technique; verbalized understanding.      Pt requires the buoyancy and hydrostatic pressure of water for support, and to offload joints by unweighting joint load by at least 50 % in navel deep water and by at least 75-80% in chest to neck deep water.  Viscosity of the water is needed for resistance of strengthening. Water current perturbations provides challenge to standing balance requiring increased core activation.   PATIENT EDUCATION:  Education details: intro to aquatic therapy  Person educated: Patient Education method: Explanation, Demonstration, Tactile cues, and Verbal cues Education comprehension: verbalized understanding, returned demonstration, verbal cues required, tactile  cues required, and needs further education  HOME EXERCISE PROGRAM: To be established at pool   ASSESSMENT:  CLINICAL IMPRESSION: Avoided heel/toe walking as this irritated her L ankle last visit. She reported some popping in L knee when dec- improved ability for heel strike after ankle stretches at stairs. Pain remained 6/10 throughout session.  Goals ongoing    From initial evaluation:  Patient is a 48 y.o. F who was seen today for physical therapy evaluation and treatment for Lt ankle pain- subacute on chronic onset. Pt has recently lost 130lb and has very limited central stability leading to distal pain and lack of control. Able to ambulate but lacks DF which puts her at a trip risk. Pt will benefit significantly from aquatic therapy for gross biomechanical chain strengthening and endurance challenge. I did introduce her to aquatics today and walked her back to the pool.     OBJECTIVE IMPAIRMENTS: Abnormal gait, decreased activity tolerance, decreased balance, decreased coordination, decreased mobility, difficulty walking, decreased ROM, decreased strength, dizziness, increased edema, impaired flexibility, impaired sensation, improper body mechanics, obesity, and pain.   ACTIVITY LIMITATIONS: standing, squatting, stairs, transfers, bed mobility, bathing, toileting, hygiene/grooming, and locomotion level  PARTICIPATION LIMITATIONS: cleaning, laundry, shopping, and community activity  PERSONAL FACTORS: obesity & weight changes, blood pressure changes/medication management, peripheral neuropathy, recent falls are also affecting patient's functional outcome.   REHAB POTENTIAL: Good  CLINICAL DECISION MAKING: Unstable/unpredictable  EVALUATION COMPLEXITY: High   GOALS: Goals reviewed with patient? Yes  SHORT TERM GOALS: Target date: 6/13  Verbalize awareness of core engagement in exercises Baseline: will progress as appropriate Goal status: Met 11/26/23  2.  Able to navigate  stairs into/out of pool with proper, reciprocal form Baseline: will progress as appropriate Goal status: IN PROGRESS- 11/28/23  (step-to pattern in; step through pattern out)   LONG TERM GOALS: Target date: POC date  BERG to improve by MDC Baseline: see obj Goal status: INITIAL  2.  LEFS to improve by MDC Baseline: see obj Goal status: INITIAL  3.  Able to stand from standard chair without UEs Baseline: unable at eval Goal status: In progress - 12/10/23  4.   Active DF to, at least, neutral with knee extended Baseline: see obj Goal status: INITIAL    PLAN:  PT FREQUENCY: 1-2x/week  PT DURATION: POC date  PLANNED INTERVENTIONS: 97164- PT Re-evaluation, 97750- Physical Performance Testing, 97110-Therapeutic exercises, 97530- Therapeutic activity, [id number]- Neuromuscular re-education, 97535- Self Care, [id number]- Manual therapy, [phone]- Gait training, [phone]- Aquatic Therapy, Patient/Family education, Balance training, Stair training, Taping, Dry Needling, Joint mobilization, Spinal mobilization,  Cryotherapy, and Moist heat.  PLAN FOR NEXT SESSION: continue aquatic therapy.        For all possible CPT codes, reference the Planned Interventions line above.     Check all conditions that are expected to impact treatment: {Conditions expected to impact treatment:Morbid obesity and Neurological condition and/or seizures   If treatment provided at initial evaluation, no treatment charged due to lack of authorization.     Delon Aquas, PTA 12/10/23 1:14 PM [facility] [address] [place], [place], [address] Phone: [phone]   Fax:  [phone]

## 2023-12-12 DIAGNOSIS — G5793 Unspecified mononeuropathy of bilateral lower limbs: Secondary | ICD-10-CM | POA: Diagnosis not present

## 2023-12-13 ENCOUNTER — Ambulatory Visit (HOSPITAL_BASED_OUTPATIENT_CLINIC_OR_DEPARTMENT_OTHER): Admitting: Physical Therapy

## 2023-12-14 DIAGNOSIS — G5793 Unspecified mononeuropathy of bilateral lower limbs: Secondary | ICD-10-CM | POA: Diagnosis not present

## 2023-12-17 ENCOUNTER — Ambulatory Visit (HOSPITAL_BASED_OUTPATIENT_CLINIC_OR_DEPARTMENT_OTHER): Admitting: Physical Therapy

## 2023-12-19 ENCOUNTER — Ambulatory Visit (INDEPENDENT_AMBULATORY_CARE_PROVIDER_SITE_OTHER): Admitting: Family Medicine

## 2023-12-19 DIAGNOSIS — G5793 Unspecified mononeuropathy of bilateral lower limbs: Secondary | ICD-10-CM | POA: Diagnosis not present

## 2023-12-20 ENCOUNTER — Ambulatory Visit (INDEPENDENT_AMBULATORY_CARE_PROVIDER_SITE_OTHER): Admitting: Family Medicine

## 2023-12-20 ENCOUNTER — Encounter (INDEPENDENT_AMBULATORY_CARE_PROVIDER_SITE_OTHER): Payer: Self-pay | Admitting: Family Medicine

## 2023-12-20 ENCOUNTER — Ambulatory Visit (HOSPITAL_BASED_OUTPATIENT_CLINIC_OR_DEPARTMENT_OTHER): Attending: Family Medicine | Admitting: Physical Therapy

## 2023-12-20 ENCOUNTER — Encounter (HOSPITAL_BASED_OUTPATIENT_CLINIC_OR_DEPARTMENT_OTHER): Payer: Self-pay | Admitting: Physical Therapy

## 2023-12-20 VITALS — BP 96/65 | HR 95 | Temp 98.5°F | Ht 73.0 in | Wt 325.0 lb

## 2023-12-20 DIAGNOSIS — Z7984 Long term (current) use of oral hypoglycemic drugs: Secondary | ICD-10-CM

## 2023-12-20 DIAGNOSIS — D508 Other iron deficiency anemias: Secondary | ICD-10-CM

## 2023-12-20 DIAGNOSIS — M25372 Other instability, left ankle: Secondary | ICD-10-CM | POA: Insufficient documentation

## 2023-12-20 DIAGNOSIS — E1159 Type 2 diabetes mellitus with other circulatory complications: Secondary | ICD-10-CM

## 2023-12-20 DIAGNOSIS — Z7985 Long-term (current) use of injectable non-insulin antidiabetic drugs: Secondary | ICD-10-CM

## 2023-12-20 DIAGNOSIS — I152 Hypertension secondary to endocrine disorders: Secondary | ICD-10-CM

## 2023-12-20 DIAGNOSIS — Z9884 Bariatric surgery status: Secondary | ICD-10-CM

## 2023-12-20 DIAGNOSIS — E1169 Type 2 diabetes mellitus with other specified complication: Secondary | ICD-10-CM | POA: Diagnosis not present

## 2023-12-20 DIAGNOSIS — E559 Vitamin D deficiency, unspecified: Secondary | ICD-10-CM | POA: Diagnosis not present

## 2023-12-20 DIAGNOSIS — Z6841 Body Mass Index (BMI) 40.0 and over, adult: Secondary | ICD-10-CM

## 2023-12-20 DIAGNOSIS — M19172 Post-traumatic osteoarthritis, left ankle and foot: Secondary | ICD-10-CM | POA: Diagnosis present

## 2023-12-20 DIAGNOSIS — E65 Localized adiposity: Secondary | ICD-10-CM

## 2023-12-20 MED ORDER — VITAMIN D (ERGOCALCIFEROL) 1.25 MG (50000 UNIT) PO CAPS: ORAL_CAPSULE | ORAL | 0 refills | Status: DC

## 2023-12-20 MED ORDER — PNV PRENATAL PLUS MULTIVIT+DHA 27-1 & 312 MG PO MISC: ORAL | 0 refills | Status: DC

## 2023-12-20 MED ORDER — SEMAGLUTIDE (1 MG/DOSE) 4 MG/3ML ~~LOC~~ SOPN: 1.0000 mg | PEN_INJECTOR | SUBCUTANEOUS | 0 refills | Status: DC

## 2023-12-20 NOTE — Therapy (Signed)
 OUTPATIENT PHYSICAL THERAPY TREATMENT Progress Note/Re-cert Reporting Period 11/02/23 to 12/20/23  See note below for Objective Data and Assessment of Progress/Goals.      Patient Name: Karen Brennan MRN: [id number] DOB:1976/04/18, 48 y.o., female Today's Date: 12/20/2023  END OF SESSION:  PT End of Session - 12/20/23 0937     Visit Number 8    Number of Visits 13    Date for PT Re-Evaluation 02/15/24    Authorization Type [employer] MCD    PT Start Time 0935    PT Stop Time 1015    PT Time Calculation (min) 40 min    Activity Tolerance Patient tolerated treatment well    Behavior During Therapy [facility] for tasks assessed/performed          Past Medical History:  Diagnosis Date   ANEMIA 08/14/2008   Qualifier: Diagnosis of  By: Joyice MD, Jessica     Anxiety    Asthma    B12 deficiency 08/29/2021   Bipolar 1 disorder (HCC)    Blood transfusion    Cellulitis 07/15/2020   Of right buttock   Chest pain    Depression    Edema of both lower extremities    GERD (gastroesophageal reflux disease)    History of bronchitis    Hypertension    Kidney stones 10/09/2011   Migraines 10/09/2011   often   Morbid obesity with BMI of 50.0-59.9, adult (HCC) 08/09/2006   Qualifier: Diagnosis of   By: Joyice MD, Jessica         Pre-diabetes    Schizophrenia (HCC)    SOB (shortness of breath)    Vitamin D  deficiency 08/29/2021   Past Surgical History:  Procedure Laterality Date   ANKLE FRACTURE SURGERY   1990's   left; put screws in   ENDOMETRIAL ABLATION  ~ 2011   FRACTURE SURGERY     GASTRIC BY PASS     TUBAL LIGATION  2006   Patient Active Problem List   Diagnosis Date Noted   Severe dysplasia of cervix (CIN III) 08/20/2023   Open wound of foot, right, initial encounter 06/25/2023   Plantar fasciitis 06/25/2023   BMI 45.0-49.9, adult (HCC)-current bmi 46.8 07/12/2022   Chronic constipation 06/14/2022   Seasonal allergies 05/01/2022   Hyperlipidemia associated with type 2  diabetes mellitus (HCC) 12/05/2021   Type II diabetes mellitus (HCC) 08/29/2021   PVC's (premature ventricular contractions) 07/19/2021   Intertrigo 02/16/2020   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 Include strengthening exercises and aquatic therapy in treatment plan.   Rationale for Evaluation and Treatment: Rehabilitation  THERAPY DIAG:  Instability of left ankle joint  Post-traumatic osteoarthritis of left ankle  ONSET DATE: 2-3 mo ago (around Feb 2025) acute on chronic   SUBJECTIVE:  SUBJECTIVE STATEMENT: Pt reports she had increased pain after last visit.  Wore her boot, used ice on ankle and alternated between tylenol  and ibuprofen  for pain relief.  Kept tape on for 4 day; continues to provide relief.  Hasn't made it to [facility] yet; looking for adult swim diapers for her daughter.   POOL ACCESS:  [employer]  From initial evaluation: Broke my ankle when I was a teenager, hardware removed in the past. It swells really bad. I usually wear compression socks and ASO with tennis shoes. Sometimes limited in shoe wear by ankle edema. Two falls in the last 2 months due to dizziness, blacked out and fell on knees. On BP meds-  changing due to recent weight loss.   PERTINENT HISTORY:  Peripheral neuropathy- reports occasional sensation of pressure of floor on foot, gabapentin  for management.   PAIN:  Are you having pain? Yes: NPRS scale: 6/10 Pain location: L ankle and lower leg  Pain description: tight, tender Aggravating factors: standing too long Relieving factors: ibuprofen , tylenol   PRECAUTIONS:  Fall  RED FLAGS: None   WEIGHT BEARING RESTRICTIONS:  No  FALLS:  Has patient fallen in last 6 months? Yes. Number of falls 5  LIVING ENVIRONMENT: Lives with: lives with their daughter x3 Lives in: House/apartment- no stairs  OCCUPATION:  On disability  PLOF:  Sometimes needs help with bathing and getting off the couch  PATIENT GOALS:  Working on weight loss   OBJECTIVE:  Note: Objective measures were completed at Evaluation unless otherwise noted.  DIAGNOSTIC FINDINGS:  WFL  PATIENT SURVEYS:  LEFS  Extreme difficulty/unable (0), Quite a bit of difficulty (1), Moderate difficulty (2), Little difficulty (3), No difficulty (4) Survey date:  5/23 12/20/23  Any of your usual work, housework or school activities 1 2  2. Usual hobbies, recreational or sporting activities 0 1  3. Getting into/out of the bath 0 2  4. Walking between rooms 1 3  5. Putting on socks/shoes 1 2  6. Squatting  0 0  7. Lifting an object, like a bag of groceries from the floor 1 1  8. Performing light activities around your home 1 2  9. Performing heavy activities around your home 0 1  10. Getting into/out of a car 0 2  11. Walking 2 blocks 0 0  12. Walking 1 mile 0 0  13. Going up/down 10 stairs (1 flight) 0 0  14. Standing for 1 hour 0 0  15.  sitting for 1 hour 0 2  16. Running on even ground 0 1  17. Running on uneven ground 0 1  18. Making sharp turns while running fast 0 0  19. Hopping  0 0  20. Rolling over in bed 1 2  Score total:  6 22     COGNITIVE STATUS: Within functional limits for tasks  assessed   SENSATION: Limited due to peripheral neuropathy in bilat feet  EDEMA:  Yes: Lt lateral maleolus   GAIT: Eval: flexed posture, lacking DF at heel strike bilat, antalgic, slow cadence.    Body Part #1 Ankle  EVAL ankle AROM DF: knee flexed-3, knee ext -10  Able to stand with DF at -3 deg, unable to demo standing toe raise   12/10/23:  L ankle DF with knee ext -10  2/89/74 Re-cert ankle AROM: knee flex -3 , knee extended -8 FUNCTIONAL TESTS:  Lars Balance Scale:  Item Test date: 5/23EVAL Test date: 12/20/23 Test date:   Sitting to  standing 1. needs minimal aid to stand or stabilize 2. able to stand using hands after several tries  4. able to stand safely for 2 minutes  4. able to sit safely and securely for 2 minutes   3. controls descent by using hands  3. able to transfer safely with definite need of hands  4. able to stand 10 seconds safely  4. able to place feet together independently and stand 1 minute safely  4. can reach forward confidently 25 cm (10 inches)    3. able to pick up slipper but needs supervision  4. looks behind from both sides and weight shifts well    2. able to turn 360 degrees safely but slowly  0. needs assistance to keep from falling/unable to try    0. loses balance while stepping or standing   1    Insert OPRCBERGREEVAL SmartPhrase at re-test date  2. Standing unsupported 4. able to stand safely for 2 minutes    3. Sitting with back unsupported, feet supported 4. able to sit safely and securely for 2 minutes    4. Standing to sitting 1. sits independently but has uncontrolled descent    5. Pivot transfer  3. able to transfer safely with definite need of hands    6. Standing unsupported with eyes closed 4. able to stand 10 seconds safely    7. Standing unsupported with feet together 4. able to place feet together independently and stand 1 minute safely    8. Reaching forward with outstretched arms while standing 3. can reach  forward 12 cm (5 inches)    9. Pick up object from the floor from standing 3. able to pick up slipper but needs supervision    10. Turning to look behind over left and right shoulders while standing 3. looks behind one side only, other side shows less weight shift    11. Turn 360 degrees 2. able to turn 360 degrees safely but slowly    12. Place alternate foot on step or stool while standing unsupported 2. able to complete 4 steps without aid with supervision    13. Standing unsupported one foot in front 3. able to place foot ahead independently and hold 30 seconds    14. Standing on one leg 1. tries to lift leg unable to hold 3 seconds but remains standing independently.      Total Score 38/56 Total Score 38/56 Total Score /56                                                                                                                                 TREATMENT DATE:  [facility] Adult PT Treatment:                                             12/20/23  Re-cert testing: Berg; ROM; LEFS  Pt seen for aquatic therapy today.  Treatment took place in water 3.5-4.75 ft in depth at the [facility] pool. Temp of water was 91.  Pt entered/exited the pool via stairs independently in step-to pattern with bilat rail.  - unsupported walking forward/ backward (in deepest water) - cues for heel/toe and toe/heel, multiple laps - unsupported side stepping  -Tandem stance 4.5ft ue support yellow HB leading R/L. 2 sets of 20s hold. Cues for abd bracing.  Moderate instability using hip strategy -UE support on wall 4.4.ft: attempting SLS R/L after several tries best 4s hold -Forward tandem walking ue support yellow HB (difficult good challenge) -Step ups onto bottom step leading r/l x 5.  VC for weight shift and quad/hs engagement  Pt requires the buoyancy and hydrostatic pressure of water for support, and to offload joints by unweighting joint load by at least 50 % in navel deep water and by at least  75-80% in chest to neck deep water.  Viscosity of the water is needed for resistance of strengthening. Water current perturbations provides challenge to standing balance requiring increased core activation.   PATIENT EDUCATION:  Education details: intro to aquatic therapy  Person educated: Patient Education method: Explanation, Demonstration, Tactile cues, and Verbal cues Education comprehension: verbalized understanding, returned demonstration, verbal cues required, tactile cues required, and needs further education  HOME EXERCISE PROGRAM: To be established at pool   ASSESSMENT:  CLINICAL IMPRESSION: PN: Pt has made some minor improvements in left ankle ROM. She has met her Lefs goal and reports increased mobility around home.  She continues to have deficits in balance with no improvement on Berg but reports no falls. Her pain has continued to be variable without an overall reduction in max pain in left ankle. She has continued to lose weight adding another 17lbs to her overall  weight lose.  Plan to increase focus on central mobility (core strength and balance). She will continue to benefit form skilled aquatic PT progressing towards all stated goals.      From initial evaluation:  Patient is a 48 y.o. F who was seen today for physical therapy evaluation and treatment for Lt ankle pain- subacute on chronic onset. Pt has recently lost 130lb and has very limited central stability leading to distal pain and lack of control. Able to ambulate but lacks DF which puts her at a trip risk. Pt will benefit significantly from aquatic therapy for gross biomechanical chain strengthening and endurance challenge. I did introduce her to aquatics today and walked her back to the pool.     OBJECTIVE IMPAIRMENTS: Abnormal gait, decreased activity tolerance, decreased balance, decreased coordination, decreased mobility, difficulty walking, decreased ROM, decreased strength, dizziness, increased edema,  impaired flexibility, impaired sensation, improper body mechanics, obesity, and pain.   ACTIVITY LIMITATIONS: standing, squatting, stairs, transfers, bed mobility, bathing, toileting, hygiene/grooming, and locomotion level  PARTICIPATION LIMITATIONS: cleaning, laundry, shopping, and community activity  PERSONAL FACTORS: obesity & weight changes, blood pressure changes/medication management, peripheral neuropathy, recent falls are also affecting patient's functional outcome.   REHAB POTENTIAL: Good  CLINICAL DECISION MAKING: Unstable/unpredictable  EVALUATION COMPLEXITY: High   GOALS: Goals reviewed with patient? Yes  SHORT TERM GOALS: Target date: 6/13  Verbalize awareness of core engagement in exercises Baseline: will progress as appropriate Goal status: Met 11/26/23  2.  Able to navigate stairs into/out of pool with proper, reciprocal form Baseline: will progress as appropriate Goal status: IN PROGRESS- 11/28/23  (step-to pattern  in; step through pattern out)   LONG TERM GOALS: Target date: 02/15/24  BERG to improve by MDC (5 points) Baseline: see obj Goal status: In Progress 12/20/23  2.  LEFS to improve by MDC Baseline: see obj Goal status:Met 12/20/23  3.  Able to stand from standard chair without UEs Baseline: unable at eval Goal status: In progress - 12/10/23; 12/20/23  4.   Active DF to, at least, neutral with knee extended Baseline: see obj Goal status: In progress 12/20/23    PLAN:  PT FREQUENCY: 1-2x/week  PT DURATION:8 weeks alternating land and aquatics (extending time due to conflicts on scheduling 6 land visits, 6 water visits.  PLANNED INTERVENTIONS: 97164- PT Re-evaluation, 97750- Physical Performance Testing, 97110-Therapeutic exercises, 97530- Therapeutic activity, [id number]- Neuromuscular re-education, 97535- Self Care, 02859- Manual therapy, [phone]- Gait training, [phone]- Aquatic Therapy, Patient/Family education, Balance training, Stair training, Taping,  Dry Needling, Joint mobilization, Spinal mobilization, Cryotherapy, and Moist heat.  PLAN FOR NEXT SESSION: continue aquatic therapy with transition onto land for added load for strengthening  Ronal Foots) Shakia Sebastiano MPT 12/24/23 9:37 AM [facility] [address] [place], [place], [address] Phone: [phone]   Fax:  [phone]     For all possible CPT codes, reference the Planned Interventions line above.     Check all conditions that are expected to impact treatment: {Conditions expected to impact treatment:Morbid obesity and Neurological condition and/or seizures   If treatment provided at initial evaluation, no treatment charged due to lack of authorization.

## 2023-12-21 DIAGNOSIS — G5793 Unspecified mononeuropathy of bilateral lower limbs: Secondary | ICD-10-CM | POA: Diagnosis not present

## 2023-12-21 NOTE — Progress Notes (Signed)
 Karen Brennan, D.O.  ABFM, ABOM Specializing in Clinical Bariatric Medicine  Office located at: [address] [place], [place]  [address]   Assessment and Plan:  No orders of the defined types were placed in this encounter.   Medications Discontinued During This Encounter  Medication Reason   Vitamin D , Ergocalciferol , (DRISDOL ) 1.25 MG (50000 UNIT) CAPS capsule Reorder   Semaglutide , 1 MG/DOSE, 4 MG/3ML SOPN Reorder   Prenatal Vit-Fe Fum-FA-Omega (PNV PRENATAL PLUS MULTIVIT+DHA) 27-1 & 312 MG MISC Reorder     Meds ordered this encounter  Medications   Vitamin D , Ergocalciferol , (DRISDOL ) 1.25 MG (50000 UNIT) CAPS capsule    Sig: 1 tab q wed and 1 tab q sun    Dispense:  8 capsule    Refill:  0    ** OV for RF **   Do not send RF request   Semaglutide , 1 MG/DOSE, 4 MG/3ML SOPN    Sig: Inject 1 mg as directed once a week.    Dispense:  3 mL    Refill:  0   Prenatal Vit-Fe Fum-FA-Omega (PNV PRENATAL PLUS MULTIVIT+DHA) 27-1 & 312 MG MISC    Sig: 1 po qd    Dispense:  90 each    Refill:  0      FOR THE DISEASE OF OBESITY:  Type 2 diabetes mellitus with obesity (HCC)  Vitamin D  deficiency -     Vitamin D  (Ergocalciferol ); 1 tab q wed and 1 tab q sun  Dispense: 8 capsule; Refill: 0  Other iron deficiency anemia -     PNV Prenatal Plus Multivit+DHA; 1 po qd  Dispense: 90 each; Refill: 0  S/P gastric bypass -     PNV Prenatal Plus Multivit+DHA; 1 po qd  Dispense: 90 each; Refill: 0  Hypertension associated with type 2 diabetes mellitus (HCC)  Visceral obesity  Morbid obesity (HCC)-starting bmi 58.45/date 02/01/21  BMI 40.0-44.9, adult (HCC)- current BMI 42.88  Other orders -     Semaglutide  (1 MG/DOSE); Inject 1 mg as directed once a week.  Dispense: 3 mL; Refill: 0     Since last office visit on *** patient's muscle mass has {DID:29233} by ***lbs. Fat mass has {DID:29233} by ***lbs. Total body water has {DID:29233} by ***lbs.  Counseling done on how  various foods will affect these numbers and how to maximize success  Total lbs lost to date: *** Total weight loss percentage to date: ***    Recommended Dietary Goals [name] is currently in the action stage of change. As such, her goal is to continue weight management plan.  She has agreed to: {EMWTLOSSPLAN:29297::continue current plan}   Behavioral Intervention We discussed the following today: {dowtlossstrategies:31654}  Additional resources provided today: {DOhandouts:31655::None}  Evidence-based interventions for health behavior change were utilized today including the discussion of self monitoring techniques, problem-solving barriers and SMART goal setting techniques.   Regarding patient's less desirable eating habits and patterns, we employed the technique of small changes.   Pt will specifically work on: ***    Recommended Physical Activity Goals [name] has been advised to work up to 300-450 minutes of moderate intensity aerobic activity a week and strengthening exercises 2-3 times per week for cardiovascular health, weight loss maintenance and preservation of muscle mass.   She has agreed to: {EMEXERCISE:28847::Think about enjoyable ways to increase daily physical activity and overcoming barriers to exercise,Increase physical activity in their day and reduce sedentary time (increase NEAT).}   Pharmacotherapy We both agreed  to: {EMagreedrx:29170}   ASSOCIATED CONDITIONS ADDRESSED TODAY:  Type 2 diabetes mellitus with obesity (HCC)  Vitamin D  deficiency -     Vitamin D  (Ergocalciferol ); 1 tab q wed and 1 tab q sun  Dispense: 8 capsule; Refill: 0  Other iron deficiency anemia -     PNV Prenatal Plus Multivit+DHA; 1 po qd  Dispense: 90 each; Refill: 0  S/P gastric bypass -     PNV Prenatal Plus Multivit+DHA; 1 po qd  Dispense: 90 each; Refill: 0  Hypertension associated with type 2 diabetes mellitus (HCC)  Visceral obesity  Morbid obesity (HCC)-starting  bmi 58.45/date 02/01/21  BMI 40.0-44.9, adult (HCC)- current BMI 42.88  Other orders -     Semaglutide  (1 MG/DOSE); Inject 1 mg as directed once a week.  Dispense: 3 mL; Refill: 0      Follow up:   Return in about 4 weeks (around 01/17/2024).*** She was informed of the importance of frequent follow up visits to maximize her success with intensive lifestyle modifications for her multiple health conditions.   Subjective:   Chief complaint: Obesity Karen Brennan is here to discuss her progress with her obesity treatment plan. She is on {MWMwtlossportion/plan2:23431} and states she is following her eating plan approximately ***% of the time. She states she is *** minutes *** days per week OR  *** not exercising (delete one)  Interval History:  Karen Brennan is here for a follow up office visit. Since last OV on *** , she is ***.      (Use this table if you want, if not delete)   Meal Content  1st meal Time: *** Food choice: *** Beverage: ***  2nd Meal Time: *** Food choice: *** Beverage: ***  3rd Meal Time: *** Food choice: *** Beverage: ***  Snacks/Desserts AM: *** PM: *** Evening: ***  Oral Fluids *** ETOH: ***       Pharmacotherapy that aid with weight loss: She is currently taking {EMPharmaco:28845}.   Review of Systems:  Pertinent positives were addressed with patient today.  Reviewed by clinician on day of visit: allergies, medications, problem list, medical history, surgical history, family history, social history, and previous encounter notes.  Weight Summary and Biometrics   Weight Lost Since Last Visit: 17lb  Weight Gained Since Last Visit: 0lb  ***  Vitals Temp: 98.5 F (36.9 C) BP: 96/65 Pulse Rate: 95 SpO2: 100 %   Anthropometric Measurements Height: 6' 1 (1.854 m) Weight: (!) 325 lb (147.4 kg) BMI (Calculated): 42.89 Weight at Last Visit: 332lb Weight Lost Since Last Visit: 17lb Weight Gained Since Last Visit: 0lb Starting Weight:  443lb Total Weight Loss (lbs): 118 lb (53.5 kg)   Body Composition  Body Fat %: 51.4 % Fat Mass (lbs): 167.2 lbs Muscle Mass (lbs): 150 lbs Total Body Water (lbs): 113 lbs Visceral Fat Rating : 16   Other Clinical Data Fasting: No Labs: No Today's Visit #: 41 Starting Date: 02/01/21    Objective:   PHYSICAL EXAM: Blood pressure 96/65, pulse 95, temperature 98.5 F (36.9 C), height 6' 1 (1.854 m), weight (!) 325 lb (147.4 kg), SpO2 100%. Body mass index is 42.88 kg/m.  General: she is overweight, cooperative and in no acute distress. PSYCH: Has normal mood, affect and thought process.   HEENT: EOMI, sclerae are anicteric. Lungs: Normal breathing effort, no conversational dyspnea. Extremities: Moves * 4 Neurologic: A and O * 3, good insight  DIAGNOSTIC DATA REVIEWED: BMET    Component Value Date/Time  NA 142 10/25/2023 1413   K 3.9 10/25/2023 1413   CL 104 10/25/2023 1413   CO2 24 10/25/2023 1413   GLUCOSE 89 10/25/2023 1413   GLUCOSE 208 (H) 07/04/2022 1037   BUN 13 10/25/2023 1413   CREATININE 0.66 10/25/2023 1413   CREATININE 0.82 07/05/2016 0942   CALCIUM  8.9 10/25/2023 1413   GFRNONAA >60 07/04/2022 1037   GFRNONAA >89 07/05/2016 0942   GFRAA 99 02/12/2020 1423   GFRAA >89 07/05/2016 0942   Lab Results  Component Value Date   HGBA1C 5.5 08/08/2023   HGBA1C 5.7 09/15/2011   Lab Results  Component Value Date   INSULIN  11.0 01/12/2022   INSULIN  22.7 02/01/2021   Lab Results  Component Value Date   TSH 1.150 04/11/2023   CBC    Component Value Date/Time   WBC 8.7 07/24/2023 1112   WBC 10.4 07/04/2022 1037   RBC 4.54 07/24/2023 1112   RBC 4.76 07/04/2022 1037   HGB 13.3 07/24/2023 1112   HCT 40.6 07/24/2023 1112   PLT 357 07/24/2023 1112   MCV 89 07/24/2023 1112   MCH 29.3 07/24/2023 1112   MCH 29.6 07/04/2022 1037   MCHC 32.8 07/24/2023 1112   MCHC 32.5 07/04/2022 1037   RDW 11.8 07/24/2023 1112   Iron Studies    Component Value  Date/Time   IRON 55 10/25/2023 1413   TIBC 407 08/08/2023 1029   FERRITIN 52 08/08/2023 1029   IRONPCTSAT 25 08/08/2023 1029   Lipid Panel     Component Value Date/Time   CHOL 126 02/16/2023 1605   TRIG 62 02/16/2023 1605   HDL 57 02/16/2023 1605   CHOLHDL 2.2 02/16/2023 1605   CHOLHDL 3.8 12/09/2014 1102   VLDL 25 12/09/2014 1102   LDLCALC 56 02/16/2023 1605   Hepatic Function Panel     Component Value Date/Time   PROT 6.3 04/11/2023 1050   ALBUMIN 4.1 04/11/2023 1050   AST 20 04/11/2023 1050   ALT 16 04/11/2023 1050   ALKPHOS 56 04/11/2023 1050   BILITOT 1.1 04/11/2023 1050   BILIDIR 0.1 02/02/2009 2254   IBILI 0.6 02/02/2009 2254      Component Value Date/Time   TSH 1.150 04/11/2023 1050   Nutritional Lab Results  Component Value Date   VD25OH 52.0 10/25/2023   VD25OH 42.2 08/08/2023   VD25OH 34.9 04/11/2023    Attestations:   I, ***, acting as a Stage manager for Karen Jenkins, DO., have compiled all relevant documentation for today's office visit on behalf of Karen Jenkins, DO, while in the presence of [employer], DO.  Reviewed by clinician on day of visit: allergies, medications, problem list, medical history, surgical history, family history, social history, and previous encounter notes pertinent to patient's obesity diagnosis. I have spent 40 *** minutes in the care of the patient today including: preparing to see patient (e.g. review and interpretation of tests, old notes ), obtaining and/or reviewing separately obtained history, performing a medically appropriate examination or evaluation, counseling and educating the patient, ordering medications, test or procedures, documenting clinical information in the electronic or other health care record, and independently interpreting results and communicating results to the patient, family, or caregiver   I have reviewed the above documentation for accuracy and completeness, and I agree with the above.  Karen JINNY Brennan, D.O.  The 21st Century Cures Act was signed into law in 2016 which includes the topic of electronic health records.  This provides immediate access to information in MyChart.  This includes consultation notes, operative notes, office notes, lab results and pathology reports.  If you have any questions about what you read please let us  know at your next visit so we can discuss your concerns and take corrective action if need be.  We are right here with you.

## 2023-12-24 ENCOUNTER — Encounter (HOSPITAL_BASED_OUTPATIENT_CLINIC_OR_DEPARTMENT_OTHER): Payer: Self-pay | Admitting: Physical Therapy

## 2023-12-24 ENCOUNTER — Ambulatory Visit (HOSPITAL_BASED_OUTPATIENT_CLINIC_OR_DEPARTMENT_OTHER): Admitting: Physical Therapy

## 2023-12-24 DIAGNOSIS — G5793 Unspecified mononeuropathy of bilateral lower limbs: Secondary | ICD-10-CM | POA: Diagnosis not present

## 2023-12-24 DIAGNOSIS — M19172 Post-traumatic osteoarthritis, left ankle and foot: Secondary | ICD-10-CM

## 2023-12-24 DIAGNOSIS — M25372 Other instability, left ankle: Secondary | ICD-10-CM

## 2023-12-24 NOTE — Therapy (Signed)
 OUTPATIENT PHYSICAL THERAPY TREATMENT      Patient Name: Karen Brennan MRN: [id number] DOB:1976-01-06, 48 y.o., female Today's Date: 12/24/2023  END OF SESSION:  PT End of Session - 12/24/23 1005     Visit Number 9    Number of Visits 20    Date for PT Re-Evaluation 02/15/24    Authorization Type [employer] MCD    PT Start Time 0931    PT Stop Time 1010    PT Time Calculation (min) 39 min    Activity Tolerance Patient tolerated treatment well    Behavior During Therapy [facility] for tasks assessed/performed           Past Medical History:  Diagnosis Date   ANEMIA 08/14/2008   Qualifier: Diagnosis of  By: Joyice MD, Jessica     Anxiety    Asthma    B12 deficiency 08/29/2021   Bipolar 1 disorder (HCC)    Blood transfusion    Cellulitis 07/15/2020   Of right buttock   Chest pain    Depression    Edema of both lower extremities    GERD (gastroesophageal reflux disease)    History of bronchitis    Hypertension    Kidney stones 10/09/2011   Migraines 10/09/2011   often   Morbid obesity with BMI of 50.0-59.9, adult (HCC) 08/09/2006   Qualifier: Diagnosis of   By: Joyice MD, Jessica         Pre-diabetes    Schizophrenia (HCC)    SOB (shortness of breath)    Vitamin D  deficiency 08/29/2021   Past Surgical History:  Procedure Laterality Date   ANKLE FRACTURE SURGERY   1990's   left; put screws in   ENDOMETRIAL ABLATION  ~ 2011   FRACTURE SURGERY     GASTRIC BY PASS     TUBAL LIGATION  2006   Patient Active Problem List   Diagnosis Date Noted   Severe dysplasia of cervix (CIN III) 08/20/2023   Open wound of foot, right, initial encounter 06/25/2023   Plantar fasciitis 06/25/2023   BMI 45.0-49.9, adult (HCC)-current bmi 46.8 07/12/2022   Chronic constipation 06/14/2022   Seasonal allergies 05/01/2022   Hyperlipidemia associated with type 2 diabetes mellitus (HCC) 12/05/2021   Type II diabetes mellitus (HCC) 08/29/2021   PVC's (premature ventricular contractions)  07/19/2021   Intertrigo 02/16/2020   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 Include strengthening exercises and aquatic therapy in treatment plan.   Rationale for Evaluation and Treatment: Rehabilitation  THERAPY DIAG:  Instability of left ankle joint  Post-traumatic osteoarthritis of left ankle  ONSET DATE: 2-3 mo ago (around Feb 2025) acute on chronic   SUBJECTIVE:  SUBJECTIVE STATEMENT: Pt reports increase in left plantar fasciitis and ankle pain after moving heavy boxes over weekend. She states she will put on boot occasionally when pain gets bad.  POOL ACCESS:  [employer]  From initial evaluation: Broke my ankle when I was a teenager, hardware removed in the past. It swells really bad. I usually wear compression socks and ASO with tennis shoes. Sometimes limited in shoe wear by ankle edema. Two falls in the last 2 months due to dizziness, blacked out and fell on knees. On BP meds- changing due to recent weight loss.   PERTINENT HISTORY:  Peripheral neuropathy- reports occasional sensation of pressure of floor on foot, gabapentin  for management.   PAIN:  Are you having pain? Yes: NPRS scale: 6/10 Pain location: L ankle  and lower leg  Pain description: tight, tender Aggravating factors: standing too long Relieving factors: ibuprofen , tylenol   PRECAUTIONS:  Fall  RED FLAGS: None   WEIGHT BEARING RESTRICTIONS:  No  FALLS:  Has patient fallen in last 6 months? Yes. Number of falls 5  LIVING ENVIRONMENT: Lives with: lives with their daughter x3 Lives in: House/apartment- no stairs  OCCUPATION:  On disability  PLOF:  Sometimes needs help with bathing and getting off the couch  PATIENT GOALS:  Working on weight loss   OBJECTIVE:  Note: Objective measures were completed at Evaluation unless otherwise noted.  DIAGNOSTIC FINDINGS:  WFL  PATIENT SURVEYS:  LEFS  Extreme difficulty/unable (0), Quite a bit of difficulty (1), Moderate difficulty (2), Little difficulty (3), No difficulty (4) Survey date:  5/23 12/20/23  Any of your usual work, housework or school activities 1 2  2. Usual hobbies, recreational or sporting activities 0 1  3. Getting into/out of the bath 0 2  4. Walking between rooms 1 3  5. Putting on socks/shoes 1 2  6. Squatting  0 0  7. Lifting an object, like a bag of groceries from the floor 1 1  8. Performing light activities around your home 1 2  9. Performing heavy activities around your home 0 1  10. Getting into/out of a car 0 2  11. Walking 2 blocks 0 0  12. Walking 1 mile 0 0  13. Going up/down 10 stairs (1 flight) 0 0  14. Standing for 1 hour 0 0  15.  sitting for 1 hour 0 2  16. Running on even ground 0 1  17. Running on uneven ground 0 1  18. Making sharp turns while running fast 0 0  19. Hopping  0 0  20. Rolling over in bed 1 2  Score total:  6 22     COGNITIVE STATUS: Within functional limits for tasks assessed   SENSATION: Limited due to peripheral neuropathy in bilat feet  EDEMA:  Yes: Lt lateral maleolus   GAIT: Eval: flexed posture, lacking DF at heel strike bilat, antalgic, slow cadence.    Body Part #1 Ankle  EVAL ankle AROM DF:  knee flexed-3, knee ext -10  Able to stand with DF at -3 deg, unable to demo standing toe raise   12/10/23:  L ankle DF with knee ext -10  2/89/74 Re-cert ankle AROM: knee flex -3 , knee extended -8 FUNCTIONAL TESTS:  Lars Balance Scale:  Item Test date: 5/23EVAL Test date: 12/20/23 Test date:   Sitting to standing 1. needs minimal aid to stand or stabilize 2. able to stand using hands after several tries  4. able to stand safely for  2 minutes  4. able to sit safely and securely for 2 minutes   3. controls descent by using hands  3. able to transfer safely with definite need of hands  4. able to stand 10 seconds safely  4. able to place feet together independently and stand 1 minute safely  4. can reach forward confidently 25 cm (10 inches)    3. able to pick up slipper but needs supervision  4. looks behind from both sides and weight shifts well    2. able to turn 360 degrees safely but slowly  0. needs assistance to keep from falling/unable to try    0. loses balance while stepping or standing   1    Insert OPRCBERGREEVAL SmartPhrase at re-test date  2. Standing unsupported 4. able to stand safely for 2 minutes    3. Sitting with back unsupported, feet supported 4. able to sit safely and securely for 2 minutes    4. Standing to sitting 1. sits independently but has uncontrolled descent    5. Pivot transfer  3. able to transfer safely with definite need of hands    6. Standing unsupported with eyes closed 4. able to stand 10 seconds safely    7. Standing unsupported with feet together 4. able to place feet together independently and stand 1 minute safely    8. Reaching forward with outstretched arms while standing 3. can reach forward 12 cm (5 inches)    9. Pick up object from the floor from standing 3. able to pick up slipper but needs supervision    10. Turning to look behind over left and right shoulders while standing 3. looks behind one side only, other side shows less  weight shift    11. Turn 360 degrees 2. able to turn 360 degrees safely but slowly    12. Place alternate foot on step or stool while standing unsupported 2. able to complete 4 steps without aid with supervision    13. Standing unsupported one foot in front 3. able to place foot ahead independently and hold 30 seconds    14. Standing on one leg 1. tries to lift leg unable to hold 3 seconds but remains standing independently.      Total Score 38/56 Total Score 38/56 Total Score /56                                                                                                                                 TREATMENT DATE:  [facility] Adult PT Treatment:                                             12/24/23 Pt seen for aquatic therapy today.  Treatment took place in water 3.5-4.75 ft in depth at the [facility] pool. Temp of water was  91.  Pt entered/exited the pool via stairs independently in step-to pattern with bilat rail.  - unsupported walking forward/ backward (in deepest water) - cues for heel/toe and toe/heel, multiple laps - unsupported side stepping   -pt edu on using frozen water bottle for icing plantar aspect of left foot as well as stretching techniques. She VU  -gastroc and plantar fascia stretching on bottom step -Step ups onto bottom step leading r/l 2x 5.  VC for weight shift and quad/hs engagement -Forward and backward tandem walking ue support yellow HB (difficult good challenge) -TrA engagement with solid noodle pull down wide stance then staggered stance x 10 -walking between exercises for recovery   Pt requires the buoyancy and hydrostatic pressure of water for support, and to offload joints by unweighting joint load by at least 50 % in navel deep water and by at least 75-80% in chest to neck deep water.  Viscosity of the water is needed for resistance of strengthening. Water current perturbations provides challenge to standing balance requiring increased core  activation.   * once dried off applied dynamic tape to L dorsum of foot, and ankle in 2 stirrups, one crossing at talus to provide increased proprioception. Pt instructed in safe tape removal technique; verbalized understanding.   PATIENT EDUCATION:  Education details: intro to aquatic therapy  Person educated: Patient Education method: Explanation, Demonstration, Tactile cues, and Verbal cues Education comprehension: verbalized understanding, returned demonstration, verbal cues required, tactile cues required, and needs further education  HOME EXERCISE PROGRAM: To be established at pool   ASSESSMENT:  CLINICAL IMPRESSION:  Pt arrives with c/o increased ankle and foot pain.  She is instructed on icing and stretching techniques for plantar irritation of left foot. VU.  K-tape continues to provide pain relief as well as added stability. Will plan on instructing pt on indep application.  She begins transitioning onto land based intervention next week.  Goals ongoing   PN: Pt has made some minor improvements in left ankle ROM. She has met her Lefs goal and reports increased mobility around home.  She continues to have deficits in balance with no improvement on Berg but reports no falls. Her pain has continued to be variable without an overall reduction in max pain in left ankle. She has continued to lose weight adding another 17lbs to her overall  weight lose.  Plan to increase focus on central mobility (core strength and balance). She will continue to benefit form skilled aquatic PT progressing towards all stated goals.   From initial evaluation:  Patient is a 47 y.o. F who was seen today for physical therapy evaluation and treatment for Lt ankle pain- subacute on chronic onset. Pt has recently lost 130lb and has very limited central stability leading to distal pain and lack of control. Able to ambulate but lacks DF which puts her at a trip risk. Pt will benefit significantly from aquatic  therapy for gross biomechanical chain strengthening and endurance challenge. I did introduce her to aquatics today and walked her back to the pool.     OBJECTIVE IMPAIRMENTS: Abnormal gait, decreased activity tolerance, decreased balance, decreased coordination, decreased mobility, difficulty walking, decreased ROM, decreased strength, dizziness, increased edema, impaired flexibility, impaired sensation, improper body mechanics, obesity, and pain.   ACTIVITY LIMITATIONS: standing, squatting, stairs, transfers, bed mobility, bathing, toileting, hygiene/grooming, and locomotion level  PARTICIPATION LIMITATIONS: cleaning, laundry, shopping, and community activity  PERSONAL FACTORS: obesity & weight changes, blood pressure changes/medication management, peripheral neuropathy, recent falls are  also affecting patient's functional outcome.   REHAB POTENTIAL: Good  CLINICAL DECISION MAKING: Unstable/unpredictable  EVALUATION COMPLEXITY: High   GOALS: Goals reviewed with patient? Yes  SHORT TERM GOALS: Target date: 6/13  Verbalize awareness of core engagement in exercises Baseline: will progress as appropriate Goal status: Met 11/26/23  2.  Able to navigate stairs into/out of pool with proper, reciprocal form Baseline: will progress as appropriate Goal status: IN PROGRESS- 11/28/23  (step-to pattern in; step through pattern out)   LONG TERM GOALS: Target date: 02/15/24  BERG to improve by MDC (5 points) Baseline: see obj Goal status: In Progress 12/20/23  2.  LEFS to improve by MDC Baseline: see obj Goal status:Met 12/20/23  3.  Able to stand from standard chair without UEs Baseline: unable at eval Goal status: In progress - 12/10/23; 12/20/23  4.   Active DF to, at least, neutral with knee extended Baseline: see obj Goal status: In progress 12/20/23    PLAN:  PT FREQUENCY: 1-2x/week  PT DURATION:8 weeks alternating land and aquatics (extending time due to conflicts on  scheduling 6 land visits, 6 water visits.  PLANNED INTERVENTIONS: 97164- PT Re-evaluation, 97750- Physical Performance Testing, 97110-Therapeutic exercises, 97530- Therapeutic activity, [id number]- Neuromuscular re-education, 97535- Self Care, [id number]- Manual therapy, [phone]- Gait training, [phone]- Aquatic Therapy, Patient/Family education, Balance training, Stair training, Taping, Dry Needling, Joint mobilization, Spinal mobilization, Cryotherapy, and Moist heat.  PLAN FOR NEXT SESSION: continue aquatic therapy with transition onto land for added load for strengthening  Ronal Foots) Bray Vickerman MPT 12/24/23 9:37 AM [facility] [address] [place], [place], [address] Phone: [phone]   Fax:  [phone]     For all possible CPT codes, reference the Planned Interventions line above.     Check all conditions that are expected to impact treatment: {Conditions expected to impact treatment:Morbid obesity and Neurological condition and/or seizures   If treatment provided at initial evaluation, no treatment charged due to lack of authorization.

## 2023-12-26 DIAGNOSIS — G5793 Unspecified mononeuropathy of bilateral lower limbs: Secondary | ICD-10-CM | POA: Diagnosis not present

## 2023-12-27 ENCOUNTER — Ambulatory Visit

## 2023-12-28 ENCOUNTER — Ambulatory Visit (INDEPENDENT_AMBULATORY_CARE_PROVIDER_SITE_OTHER): Admitting: Family Medicine

## 2023-12-28 VITALS — BP 120/89 | HR 83 | Ht 73.0 in | Wt 330.0 lb

## 2023-12-28 DIAGNOSIS — L84 Corns and callosities: Secondary | ICD-10-CM

## 2023-12-28 DIAGNOSIS — R350 Frequency of micturition: Secondary | ICD-10-CM | POA: Diagnosis not present

## 2023-12-28 DIAGNOSIS — E1169 Type 2 diabetes mellitus with other specified complication: Secondary | ICD-10-CM

## 2023-12-28 DIAGNOSIS — G5793 Unspecified mononeuropathy of bilateral lower limbs: Secondary | ICD-10-CM | POA: Diagnosis not present

## 2023-12-28 LAB — POCT URINALYSIS DIP (MANUAL ENTRY)
Bilirubin, UA: NEGATIVE
Blood, UA: NEGATIVE
Glucose, UA: NEGATIVE mg/dL
Ketones, POC UA: NEGATIVE mg/dL
Nitrite, UA: POSITIVE — AB
Protein Ur, POC: NEGATIVE mg/dL
Spec Grav, UA: 1.02 (ref 1.010–1.025)
Urobilinogen, UA: 1 U/dL
pH, UA: 6.5 (ref 5.0–8.0)

## 2023-12-28 LAB — POCT GLYCOSYLATED HEMOGLOBIN (HGB A1C): HbA1c, POC (controlled diabetic range): 5.3 % (ref 0.0–7.0)

## 2023-12-28 LAB — POCT UA - MICROSCOPIC ONLY: RBC, Urine, Miroscopic: NONE SEEN (ref 0–2)

## 2023-12-28 NOTE — Patient Instructions (Signed)
 It was wonderful to see you today.  Please bring ALL of your medications with you to every visit.   Today we talked about:  Foot lesion - I believe you have a corn on your toe from ill-fitting shoes or rubbing against this area especially with your decreased sensation. Due to your neuropathy, I would like you to be evaluated by podiatry before trying to salicylic acid patch. I have placed an urgent referral please look out for a phone call from them.   Urinary frequency - I have gotten the urine sample and will see if you need to have an extended course of antibiotics. If you start to have worsening abdominal pain, fevers, please call our office to be seen sooner.   Thank you for choosing [facility].   Please call [phone] with any questions about today's appointment.  Areta Saliva, MD  Family Medicine

## 2023-12-28 NOTE — Progress Notes (Signed)
    SUBJECTIVE:   CHIEF COMPLAINT / HPI:  Blister  Patient reports she noticed a blister on the side of her toe a couple days ago. Says it is mildly painful. Denies fevers, drainage. She has not had blisters or lesions to her feet before.  Patient has controlled diabetes with neuropathy that she takes gabapentin  for and does endorse some decreased sensation in her feet.  Has not seen a podiatrist before.   Urinary frequency  UTI about 2 weeks ago. Took macrobid  for 5 days and symptoms went away and then for the past couple days started noticing frequency and back pain. She completed the antibiotics course. She had only frequency no dysuria when she had symptoms first time 2 weeks ago.  No fevers.  No vomiting.    PERTINENT  PMH / PSH: T2DM  OBJECTIVE:   BP 120/89   Pulse 83   Ht 6' 1 (1.854 m)   Wt (!) 330 lb (149.7 kg)   SpO2 100%   BMI 43.54 kg/m   General: Well appearing, in NAD CV: well perfused, dorsal pedal and posterior tibial pulses in tact, no BLE edema  Abd: Soft, non tender, non distended, no suprapubic or CVA tenderness  Foot: L foot with small 0.5 cm circular lesion of thickened skin, no erythema or warmth  R foot without any lesions  Bilateral feet with decreased sensation to monofilament testing on the plantar and dorsal surface of first great toe and plantar surface of first metatarsal bilaterally  Picture in media tab   ASSESSMENT/PLAN:   Assessment & Plan Corn of foot Lesion most likely a corn. Low concern for infection given exam. Most likely due to ill fitting shoes exacerbated by decreased sensation.  Considered salicylic acid patch, but would use with caution given decreased sensation.  - Referred to podiatry  - Recommended wider footwear and caution with tight sandals  Urine frequency Could be inadequate treatment of first UTI given recurrence of symptoms. Patient's previous UTI was sensitive to the antibiotic that was given. Most likely needed  longer course. Unlikely pyelonephritis as there is no fever or CVA tenderness on exam.  - UA with urine culture      Areta Saliva, MD [facility]

## 2023-12-31 ENCOUNTER — Ambulatory Visit: Payer: Self-pay | Admitting: Family Medicine

## 2023-12-31 ENCOUNTER — Telehealth: Payer: Self-pay

## 2023-12-31 ENCOUNTER — Ambulatory Visit (HOSPITAL_BASED_OUTPATIENT_CLINIC_OR_DEPARTMENT_OTHER): Admitting: Physical Therapy

## 2023-12-31 DIAGNOSIS — G5793 Unspecified mononeuropathy of bilateral lower limbs: Secondary | ICD-10-CM | POA: Diagnosis not present

## 2023-12-31 DIAGNOSIS — R350 Frequency of micturition: Secondary | ICD-10-CM

## 2023-12-31 DIAGNOSIS — M19172 Post-traumatic osteoarthritis, left ankle and foot: Secondary | ICD-10-CM

## 2023-12-31 DIAGNOSIS — M25372 Other instability, left ankle: Secondary | ICD-10-CM

## 2023-12-31 MED ORDER — AMOXICILLIN-POT CLAVULANATE 500-125 MG PO TABS: 500.0000 mg | ORAL_TABLET | Freq: Two times a day (BID) | ORAL | 0 refills | Status: DC

## 2023-12-31 MED ORDER — SULFAMETHOXAZOLE-TRIMETHOPRIM 800-160 MG PO TABS
1.0000 | ORAL_TABLET | Freq: Two times a day (BID) | ORAL | 0 refills | Status: DC
Start: 2023-12-31 — End: 2023-12-31

## 2023-12-31 NOTE — Therapy (Signed)
 OUTPATIENT PHYSICAL THERAPY TREATMENT      Patient Name: Karen Brennan MRN: [id number] DOB:10/31/1975, 48 y.o., female Today's Date: 12/31/2023  END OF SESSION:  PT End of Session - 12/31/23 0843     Visit Number 10    Number of Visits 20    Date for PT Re-Evaluation 02/15/24    Authorization Type [employer] MCD           Past Medical History:  Diagnosis Date   ANEMIA 08/14/2008   Qualifier: Diagnosis of  By: Joyice MD, Jessica     Anxiety    Asthma    B12 deficiency 08/29/2021   Bipolar 1 disorder (HCC)    Blood transfusion    Cellulitis 07/15/2020   Of right buttock   Chest pain    Depression    Edema of both lower extremities    GERD (gastroesophageal reflux disease)    History of bronchitis    Hypertension    Kidney stones 10/09/2011   Migraines 10/09/2011   often   Morbid obesity with BMI of 50.0-59.9, adult (HCC) 08/09/2006   Qualifier: Diagnosis of   By: Joyice MD, Jessica         Pre-diabetes    Schizophrenia (HCC)    SOB (shortness of breath)    Vitamin D  deficiency 08/29/2021   Past Surgical History:  Procedure Laterality Date   ANKLE FRACTURE SURGERY   1990's   left; put screws in   ENDOMETRIAL ABLATION  ~ 2011   FRACTURE SURGERY     GASTRIC BY PASS     TUBAL LIGATION  2006   Patient Active Problem List   Diagnosis Date Noted   Severe dysplasia of cervix (CIN III) 08/20/2023   Open wound of foot, right, initial encounter 06/25/2023   Plantar fasciitis 06/25/2023   BMI 45.0-49.9, adult (HCC)-current bmi 46.8 07/12/2022   Chronic constipation 06/14/2022   Seasonal allergies 05/01/2022   Hyperlipidemia associated with type 2 diabetes mellitus (HCC) 12/05/2021   Type II diabetes mellitus (HCC) 08/29/2021   PVC's (premature ventricular contractions) 07/19/2021   Intertrigo 02/16/2020   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 Include strengthening exercises and aquatic therapy in treatment plan.   Rationale for Evaluation and Treatment: Rehabilitation  THERAPY DIAG:  No diagnosis found.  ONSET DATE: 2-3 mo ago (around Feb 2025) acute on chronic   SUBJECTIVE:  SUBJECTIVE STATEMENT: The patient reports her ankle is swollen today. She reports it is hurting across the top.    POOL ACCESS:  [employer]  From initial evaluation: Broke my ankle when I was a teenager, hardware removed in the past. It swells really bad. I usually wear compression socks and ASO with tennis shoes. Sometimes limited in shoe wear by ankle edema. Two falls in the last 2 months due to dizziness, blacked out and fell on knees. On BP meds- changing due to recent weight loss.   PERTINENT HISTORY:  Peripheral neuropathy- reports occasional sensation of pressure of floor on foot, gabapentin  for management.   PAIN:  Are you having pain? Yes: NPRS scale: 5/10 Pain location: L ankle and lower leg  Pain description: tight, tender Aggravating factors: standing too long Relieving factors: ibuprofen , tylenol   PRECAUTIONS:  Fall  RED FLAGS: None   WEIGHT BEARING RESTRICTIONS:  No  FALLS:  Has patient fallen in last 6 months? Yes. Number of falls 5  LIVING ENVIRONMENT: Lives with: lives with  their daughter x3 Lives in: House/apartment- no stairs  OCCUPATION:  On disability  PLOF:  Sometimes needs help with bathing and getting off the couch  PATIENT GOALS:  Working on weight loss   OBJECTIVE:  Note: Objective measures were completed at Evaluation unless otherwise noted.  DIAGNOSTIC FINDINGS:  WFL  PATIENT SURVEYS:  LEFS  Extreme difficulty/unable (0), Quite a bit of difficulty (1), Moderate difficulty (2), Little difficulty (3), No difficulty (4) Survey date:  5/23 12/20/23  Any of your usual work, housework or school activities 1 2  2. Usual hobbies, recreational or sporting activities 0 1  3. Getting into/out of the bath 0 2  4. Walking between rooms 1 3  5. Putting on socks/shoes 1 2  6. Squatting  0 0  7. Lifting an object, like a bag of groceries from the floor 1 1  8. Performing light activities around your home 1 2  9. Performing heavy activities around your home 0 1  10. Getting into/out of a car 0 2  11. Walking 2 blocks 0 0  12. Walking 1 mile 0 0  13. Going up/down 10 stairs (1 flight) 0 0  14. Standing for 1 hour 0 0  15.  sitting for 1 hour 0 2  16. Running on even ground 0 1  17. Running on uneven ground 0 1  18. Making sharp turns while running fast 0 0  19. Hopping  0 0  20. Rolling over in bed 1 2  Score total:  6 22     COGNITIVE STATUS: Within functional limits for tasks assessed   SENSATION: Limited due to peripheral neuropathy in bilat feet  EDEMA:  Yes: Lt lateral maleolus   GAIT: Eval: flexed posture, lacking DF at heel strike bilat, antalgic, slow cadence.    Body Part #1 Ankle  EVAL ankle AROM DF: knee flexed-3, knee ext -10  Able to stand with DF at -3 deg, unable to demo standing toe raise   12/10/23:  L ankle DF with knee ext -10  2/89/74 Re-cert ankle AROM: knee flex -3 , knee extended -8 FUNCTIONAL TESTS:  Berg Balance Scale:  Item Test date: 5/23EVAL Test date: 12/20/23 Test date:   Sitting to standing 1.  needs minimal aid to stand or stabilize 2. able to stand using hands after several tries  4. able to stand safely for 2 minutes  4. able to sit safely and securely  for 2 minutes   3. controls descent by using hands  3. able to transfer safely with definite need of hands  4. able to stand 10 seconds safely  4. able to place feet together independently and stand 1 minute safely  4. can reach forward confidently 25 cm (10 inches)    3. able to pick up slipper but needs supervision  4. looks behind from both sides and weight shifts well    2. able to turn 360 degrees safely but slowly  0. needs assistance to keep from falling/unable to try    0. loses balance while stepping or standing   1    Insert OPRCBERGREEVAL SmartPhrase at re-test date  2. Standing unsupported 4. able to stand safely for 2 minutes    3. Sitting with back unsupported, feet supported 4. able to sit safely and securely for 2 minutes    4. Standing to sitting 1. sits independently but has uncontrolled descent    5. Pivot transfer  3. able to transfer safely with definite need of hands    6. Standing unsupported with eyes closed 4. able to stand 10 seconds safely    7. Standing unsupported with feet together 4. able to place feet together independently and stand 1 minute safely    8. Reaching forward with outstretched arms while standing 3. can reach forward 12 cm (5 inches)    9. Pick up object from the floor from standing 3. able to pick up slipper but needs supervision    10. Turning to look behind over left and right shoulders while standing 3. looks behind one side only, other side shows less weight shift    11. Turn 360 degrees 2. able to turn 360 degrees safely but slowly    12. Place alternate foot on step or stool while standing unsupported 2. able to complete 4 steps without aid with supervision    13. Standing unsupported one foot in front 3. able to place foot ahead independently and hold 30 seconds    14.  Standing on one leg 1. tries to lift leg unable to hold 3 seconds but remains standing independently.      Total Score 38/56 Total Score 38/56 Total Score /56                                                                                                                                 TREATMENT DATE:      12/31/2023 Manual: Talor glides Edema massage  DF stretch with distraction   There-ex:  Ankle pump with band 2x10  DF yellow 2x10   Hip abdcution red band 2x10  Knee extension red band 2x10     [facility] Adult PT Treatment:  12/24/23 Pt seen for aquatic therapy today.  Treatment took place in water 3.5-4.75 ft in depth at the [facility] pool. Temp of water was 91.  Pt entered/exited the pool via stairs independently in step-to pattern with bilat rail.  - unsupported walking forward/ backward (in deepest water) - cues for heel/toe and toe/heel, multiple laps - unsupported side stepping   -pt edu on using frozen water bottle for icing plantar aspect of left foot as well as stretching techniques. She VU  -gastroc and plantar fascia stretching on bottom step -Step ups onto bottom step leading r/l 2x 5.  VC for weight shift and quad/hs engagement -Forward and backward tandem walking ue support yellow HB (difficult good challenge) -TrA engagement with solid noodle pull down wide stance then staggered stance x 10 -walking between exercises for recovery   Pt requires the buoyancy and hydrostatic pressure of water for support, and to offload joints by unweighting joint load by at least 50 % in navel deep water and by at least 75-80% in chest to neck deep water.  Viscosity of the water is needed for resistance of strengthening. Water current perturbations provides challenge to standing balance requiring increased core activation.   * once dried off applied dynamic tape to L dorsum of foot, and ankle in 2 stirrups, one crossing at  talus to provide increased proprioception. Pt instructed in safe tape removal technique; verbalized understanding.   PATIENT EDUCATION:  Education details: intro to aquatic therapy  Person educated: Patient Education method: Explanation, Demonstration, Tactile cues, and Verbal cues Education comprehension: verbalized understanding, returned demonstration, verbal cues required, tactile cues required, and needs further education  HOME EXERCISE PROGRAM: To be established at pool   ASSESSMENT:  CLINICAL IMPRESSION:  The patient had swelling in her lateral ankle. We worked on edema massage and mobilization. Her DF is limited. She has pain with end range DF. It improved with manual therapy. We reviewed self DF stretching for home. We also reviewed base stabilization exercises. Therapy will continue to progress as tolerated.   PN: Pt has made some minor improvements in left ankle ROM. She has met her Lefs goal and reports increased mobility around home.  She continues to have deficits in balance with no improvement on Berg but reports no falls. Her pain has continued to be variable without an overall reduction in max pain in left ankle. She has continued to lose weight adding another 17lbs to her overall  weight lose.  Plan to increase focus on central mobility (core strength and balance). She will continue to benefit form skilled aquatic PT progressing towards all stated goals.   From initial evaluation:  Patient is a 48 y.o. F who was seen today for physical therapy evaluation and treatment for Lt ankle pain- subacute on chronic onset. Pt has recently lost 130lb and has very limited central stability leading to distal pain and lack of control. Able to ambulate but lacks DF which puts her at a trip risk. Pt will benefit significantly from aquatic therapy for gross biomechanical chain strengthening and endurance challenge. I did introduce her to aquatics today and walked her back to the pool.      OBJECTIVE IMPAIRMENTS: Abnormal gait, decreased activity tolerance, decreased balance, decreased coordination, decreased mobility, difficulty walking, decreased ROM, decreased strength, dizziness, increased edema, impaired flexibility, impaired sensation, improper body mechanics, obesity, and pain.   ACTIVITY LIMITATIONS: standing, squatting, stairs, transfers, bed mobility, bathing, toileting, hygiene/grooming, and locomotion level  PARTICIPATION LIMITATIONS: cleaning, laundry, shopping,  and community activity  PERSONAL FACTORS: obesity & weight changes, blood pressure changes/medication management, peripheral neuropathy, recent falls are also affecting patient's functional outcome.   REHAB POTENTIAL: Good  CLINICAL DECISION MAKING: Unstable/unpredictable  EVALUATION COMPLEXITY: High   GOALS: Goals reviewed with patient? Yes  SHORT TERM GOALS: Target date: 6/13  Verbalize awareness of core engagement in exercises Baseline: will progress as appropriate Goal status: Met 11/26/23  2.  Able to navigate stairs into/out of pool with proper, reciprocal form Baseline: will progress as appropriate Goal status: IN PROGRESS- 11/28/23  (step-to pattern in; step through pattern out)   LONG TERM GOALS: Target date: 02/15/24  BERG to improve by [facility]DC (5 points) Baseline: see obj Goal status: In Progress 12/20/23  2.  LEFS to improve by MDC Baseline: see obj Goal status:Met 12/20/23  3.  Able to stand from standard chair without UEs Baseline: unable at eval Goal status: In progress - 12/10/23; 12/20/23  4.   Active DF to, at least, neutral with knee extended Baseline: see obj Goal status: In progress 12/20/23    PLAN:  PT FREQUENCY: 1-2x/week  PT DURATION:8 weeks alternating land and aquatics (extending time due to conflicts on scheduling 6 land visits, 6 water visits.  PLANNED INTERVENTIONS: 97164- PT Re-evaluation, 97750- Physical Performance Testing, 97110-Therapeutic exercises,  97530- Therapeutic activity, [id number]- Neuromuscular re-education, 97535- Self Care, [id number]- Manual therapy, [phone]- Gait training, [phone]- Aquatic Therapy, Patient/Family education, Balance training, Stair training, Taping, Dry Needling, Joint mobilization, Spinal mobilization, Cryotherapy, and Moist heat.  PLAN FOR NEXT SESSION: continue aquatic therapy with transition onto land for added load for strengthening  Ronal Foots) Ziemba MPT 12/24/23 9:37 AM [facility] [address] [place], [place], [address] Phone: [phone]   Fax:  [phone]     For all possible CPT codes, reference the Planned Interventions line above.     Check all conditions that are expected to impact treatment: {Conditions expected to impact treatment:Morbid obesity and Neurological condition and/or seizures   If treatment provided at initial evaluation, no treatment charged due to lack of authorization.

## 2023-12-31 NOTE — Telephone Encounter (Signed)
 Received call from pharmacist that there is a contraindication between Bactrim  and Valsartan .   Pharmacist reports contraindication due to potential for hyperkalemia.   Will forward to prescriber.   Chiquita JAYSON English, RN

## 2023-12-31 NOTE — Telephone Encounter (Signed)
 Called patient to inform of positive urine culture. Informed that I would prescribe bactrim  and follow up to ensure sensitivities. Patient did not have further questions.

## 2023-12-31 NOTE — Addendum Note (Signed)
 Addended by: Jeryl Wilbourn on: 12/31/2023 01:24 PM   Modules accepted: Orders

## 2024-01-01 LAB — URINE CULTURE

## 2024-01-02 ENCOUNTER — Ambulatory Visit

## 2024-01-02 DIAGNOSIS — G5793 Unspecified mononeuropathy of bilateral lower limbs: Secondary | ICD-10-CM | POA: Diagnosis not present

## 2024-01-04 ENCOUNTER — Ambulatory Visit: Admitting: Podiatry

## 2024-01-04 DIAGNOSIS — G5793 Unspecified mononeuropathy of bilateral lower limbs: Secondary | ICD-10-CM | POA: Diagnosis not present

## 2024-01-06 ENCOUNTER — Telehealth: Admitting: Physician Assistant

## 2024-01-06 DIAGNOSIS — T3695XA Adverse effect of unspecified systemic antibiotic, initial encounter: Secondary | ICD-10-CM | POA: Diagnosis not present

## 2024-01-06 DIAGNOSIS — B379 Candidiasis, unspecified: Secondary | ICD-10-CM | POA: Diagnosis not present

## 2024-01-06 DIAGNOSIS — Z91199 Patient's noncompliance with other medical treatment and regimen due to unspecified reason: Secondary | ICD-10-CM

## 2024-01-06 MED ORDER — FLUCONAZOLE 150 MG PO TABS: ORAL_TABLET | ORAL | 0 refills | Status: DC

## 2024-01-06 NOTE — Progress Notes (Signed)
 Virtual Visit Consent   Karen Brennan, you are scheduled for a virtual visit with a [facility] provider today. Just as with appointments in the office, your consent must be obtained to participate. Your consent will be active for this visit and any virtual visit you may have with one of our providers in the next 365 days. If you have a MyChart account, a copy of this consent can be sent to you electronically.  As this is a virtual visit, video technology does not allow for your provider to perform a traditional examination. This may limit your provider's ability to fully assess your condition. If your provider identifies any concerns that need to be evaluated in person or the need to arrange testing (such as labs, EKG, etc.), we will make arrangements to do so. Although advances in technology are sophisticated, we cannot ensure that it will always work on either your end or our end. If the connection with a video visit is poor, the visit may have to be switched to a telephone visit. With either a video or telephone visit, we are not always able to ensure that we have a secure connection.  By engaging in this virtual visit, you consent to the provision of healthcare and authorize for your insurance to be billed (if applicable) for the services provided during this visit. Depending on your insurance coverage, you may receive a charge related to this service.  I need to obtain your verbal consent now. Are you willing to proceed with your visit today? Karen Brennan has provided verbal consent on 01/06/2024 for a virtual visit (video or telephone). Karen Brennan, [place]  Date: 01/06/2024 10:55 AM   Virtual Visit via Video Note   I, Karen Brennan, connected with  Karen Brennan  ([id number], 1975/07/25) on 01/06/24 at 11:00 AM EDT by a video-enabled telemedicine application and verified that I am speaking with the correct person using two identifiers.  Location: Patient: Virtual Visit Location  Patient: Home Provider: Virtual Visit Location Provider: Home Office   I discussed the limitations of evaluation and management by telemedicine and the availability of in person appointments. The patient expressed understanding and agreed to proceed.    History of Present Illness: Karen Brennan is a 48 y.o. who identifies as a female who was assigned female at birth, and is being seen today for concern of antibiotic-induced yeast infection. Endorses currently finishing a course of Augmentin  for UTI.  Has 1 day left of medication.  As of yesterday, started noticing substantial vaginal itching and mild discomfort.  No noted discharge as of yet.  Denies fever or chills.  Denies other symptoms  HPI: HPI  Problems:  Patient Active Problem List   Diagnosis Date Noted   Severe dysplasia of cervix (CIN III) 08/20/2023   Open wound of foot, right, initial encounter 06/25/2023   Plantar fasciitis 06/25/2023   HGSIL (high grade squamous intraepithelial lesion) on Pap smear of cervix 06/01/2023   BMI 45.0-49.9, adult (HCC)-current bmi 46.8 07/12/2022   Chronic constipation 06/14/2022   Seasonal allergies 05/01/2022   Hyperlipidemia associated with type 2 diabetes mellitus (HCC) 12/05/2021   Type II diabetes mellitus (HCC) 08/29/2021   PVC's (premature ventricular contractions) 07/19/2021   Intertrigo 02/16/2020   Neuropathic pain of both feet 05/09/2016   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   Acute blood loss anemia 11/14/2011   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   Morbid obesity (HCC) 11/08/2011   Asthma  11/08/2011   Bronchitis 11/08/2011   Anxiety 10/25/2011   Urinary tract infection with hematuria 10/12/2011   Bipolar 1 disorder, mixed (HCC) 09/15/2011   Allergic rhinitis 08/31/2008   GERD (gastroesophageal reflux disease) 01/24/2008   Hypertension 08/17/2006    Allergies:  Allergies  Allergen Reactions   Sibutramine Hcl Monohydrate Hives     Meridia   Medications:  Current Outpatient Medications:    fluconazole  (DIFLUCAN ) 150 MG tablet, Take 1 tablet PO once. Repeat in 3 days if needed., Disp: 2 tablet, Rfl: 0   Accu-Chek Softclix Lancets lancets, USE TO test fasting blood sugar EVERY DAY IN THE MORNING, Disp: 100 each, Rfl: 6   albuterol  (VENTOLIN  HFA) 108 (90 Base) MCG/ACT inhaler, inhale 2 PUFFS BY MOUTH every 4 hours as needed FOR wheezing OR shortness of breath, Disp: 18 g, Rfl: 1   amoxicillin -clavulanate (AUGMENTIN ) 500-125 MG tablet, Take 1 tablet by mouth 2 (two) times daily., Disp: 14 tablet, Rfl: 0   ARIPiprazole  (ABILIFY ) 20 MG tablet, Take 20 mg by mouth at bedtime as needed., Disp: , Rfl:    atorvastatin  (LIPITOR) 20 MG tablet, Take 1 tablet (20 mg total) by mouth daily., Disp: 90 tablet, Rfl: 3   B Complex-C-Folic Acid  (B COMPLEX-VITAMIN C-FOLIC ACID ) 1 MG tablet, Take 1 tablet by mouth daily with breakfast., Disp: 90 tablet, Rfl: 1   benztropine (COGENTIN) 1 MG tablet, Take 1 mg by mouth 2 (two) times daily., Disp: , Rfl:    blood glucose meter kit and supplies KIT, Dispense based on patient and insurance preference. Use up to four times daily as directed. (FOR ICD-9 250.00, 250.01)., Disp: 1 each, Rfl: 0   Blood Glucose Monitoring Suppl (ACCU-CHEK GUIDE) w/Device KIT, Use to check blood sugar 3x per day. E11.9, Disp: 1 kit, Rfl: 0   busPIRone (BUSPAR) 7.5 MG tablet, Take 7.5 mg by mouth 2 (two) times daily., Disp: , Rfl:    cetirizine -pseudoephedrine  (ZYRTEC -D) 5-120 MG tablet, Take 1 tablet by mouth daily., Disp: 10 tablet, Rfl: 0   Cyanocobalamin  1000 MCG CAPS, Take 1 tablet by mouth daily., Disp: , Rfl:    ferrous sulfate  325 (65 FE) MG tablet, Take 325 mg by mouth in the morning and at bedtime., Disp: , Rfl:    FLUoxetine (PROZAC) 40 MG capsule, Take 40 mg by mouth daily., Disp: , Rfl:    fluticasone  (FLONASE ) 50 MCG/ACT nasal spray, Place 2 sprays into both nostrils daily. Shake well before use. Gently blow nose  before spraying. Do not blow nose immediately after use. You should not taste the medication or feel it going down your throat; if you do, adjust your technique., Disp: 16 g, Rfl: 0   gabapentin  (NEURONTIN ) 300 MG capsule, 300 in AM and midday then 600 at night, Disp: 90 capsule, Rfl: 3   glucose blood (ACCU-CHEK GUIDE TEST) test strip, Use to check blood sugar 3x per day. E11.9, Disp: 100 each, Rfl: 12   haloperidol (HALDOL) 2 MG tablet, Take 2 mg by mouth daily., Disp: , Rfl:    hydrOXYzine  (VISTARIL ) 25 MG capsule, Take 25 mg by mouth every evening. Every evening as needed, Disp: , Rfl:    linaclotide  (LINZESS ) 72 MCG capsule, Take 1 capsule (72 mcg total) by mouth daily before breakfast., Disp: 90 capsule, Rfl: 0   meclizine  (ANTIVERT ) 12.5 MG tablet, Take 1 tablet (12.5 mg total) by mouth 3 (three) times daily as needed for dizziness., Disp: 20 tablet, Rfl: 0   metFORMIN  (GLUCOPHAGE ) 500 MG  tablet, Take 1 tablet (500 mg total) by mouth 2 (two) times daily with a meal., Disp: 60 tablet, Rfl: 1   metoprolol  tartrate (LOPRESSOR ) 25 MG tablet, TAKE ONE-HALF TABLET BY MOUTH TWICE DAILY AS NEEDED, Disp: 90 tablet, Rfl: 3   nystatin  (MYCOSTATIN /NYSTOP ) powder, Apply 1 Application topically 3 (three) times daily., Disp: 15 g, Rfl: 11   OLANZapine (ZYPREXA) 20 MG tablet, Take 20 mg by mouth at bedtime., Disp: , Rfl:    ondansetron  (ZOFRAN ) 4 MG tablet, Take 1 tablet (4 mg total) by mouth every 8 (eight) hours as needed for nausea or vomiting., Disp: 10 tablet, Rfl: 0   ondansetron  (ZOFRAN -ODT) 8 MG disintegrating tablet, Take 1 tablet (8 mg total) by mouth every 8 (eight) hours as needed for nausea or vomiting., Disp: 12 tablet, Rfl: 0   Prenatal Vit-Fe Fum-FA-Omega (PNV PRENATAL PLUS MULTIVIT+DHA) 27-1 & 312 MG MISC, 1 po qd, Disp: 90 each, Rfl: 0   Probiotic Product (ACIDOPHILUS PROBIOTIC BLEND PO), Take by mouth., Disp: , Rfl:    Semaglutide , 1 MG/DOSE, 4 MG/3ML SOPN, Inject 1 mg as directed once a  week., Disp: 3 mL, Rfl: 0   traZODone (DESYREL) 50 MG tablet, Take 50 mg by mouth at bedtime as needed for sleep., Disp: , Rfl:    valsartan  (DIOVAN ) 40 MG tablet, Take 1 tablet (40 mg total) by mouth at bedtime., Disp: 30 tablet, Rfl: 3   Vitamin D , Ergocalciferol , (DRISDOL ) 1.25 MG (50000 UNIT) CAPS capsule, 1 tab q wed and 1 tab q sun, Disp: 8 capsule, Rfl: 0  Observations/Objective: Patient is well-developed, well-nourished in no acute distress.  Resting comfortably at home.  Head is normocephalic, atraumatic.  No labored breathing. Speech is clear and coherent with logical content.  Patient is alert and oriented at baseline.   Assessment and Plan: 1. Antibiotic-induced yeast infection (Primary) - fluconazole  (DIFLUCAN ) 150 MG tablet; Take 1 tablet PO once. Repeat in 3 days if needed.  Dispense: 2 tablet; Refill: 0  Supportive measures reviewed.  Diflucan  per orders.  Follow-up in person for any nonresolving, new or worsening symptoms despite treatment.  Follow Up Instructions: I discussed the assessment and treatment plan with the patient. The patient was provided an opportunity to ask questions and all were answered. The patient agreed with the plan and demonstrated an understanding of the instructions.  A copy of instructions were sent to the patient via MyChart unless otherwise noted below.   The patient was advised to call back or seek an in-person evaluation if the symptoms worsen or if the condition fails to improve as anticipated.    Karen Velma Lunger, PA-C

## 2024-01-06 NOTE — Patient Instructions (Signed)
 Karen Brennan, thank you for joining [name], PA-C for today's virtual visit.  While this provider is not your primary care provider (PCP), if your PCP is located in our provider database this encounter information will be shared with them immediately following your visit.   A [facility] MyChart account gives you access to today's visit and all your visits, tests, and labs performed at [facility]  click here if you don't have a  MyChart account or go to [email or web address]  Consent: (Patient) Karen Brennan provided verbal consent for this virtual visit at the beginning of the encounter.  Current Medications:  Current Outpatient Medications:    fluconazole  (DIFLUCAN ) 150 MG tablet, Take 1 tablet PO once. Repeat in 3 days if needed., Disp: 2 tablet, Rfl: 0   Accu-Chek Softclix Lancets lancets, USE TO test fasting blood sugar EVERY DAY IN THE MORNING, Disp: 100 each, Rfl: 6   albuterol  (VENTOLIN  HFA) 108 (90 Base) MCG/ACT inhaler, inhale 2 PUFFS BY MOUTH every 4 hours as needed FOR wheezing OR shortness of breath, Disp: 18 g, Rfl: 1   amoxicillin -clavulanate (AUGMENTIN ) 500-125 MG tablet, Take 1 tablet by mouth 2 (two) times daily., Disp: 14 tablet, Rfl: 0   ARIPiprazole  (ABILIFY ) 20 MG tablet, Take 20 mg by mouth at bedtime as needed., Disp: , Rfl:    atorvastatin  (LIPITOR) 20 MG tablet, Take 1 tablet (20 mg total) by mouth daily., Disp: 90 tablet, Rfl: 3   B Complex-C-Folic Acid  (B COMPLEX-VITAMIN C-FOLIC ACID ) 1 MG tablet, Take 1 tablet by mouth daily with breakfast., Disp: 90 tablet, Rfl: 1   benztropine (COGENTIN) 1 MG tablet, Take 1 mg by mouth 2 (two) times daily., Disp: , Rfl:    blood glucose meter kit and supplies KIT, Dispense based on patient and insurance preference. Use up to four times daily as directed. (FOR ICD-9 250.00, 250.01)., Disp: 1 each, Rfl: 0   Blood Glucose Monitoring Suppl (ACCU-CHEK GUIDE) w/Device KIT, Use to check blood  sugar 3x per day. E11.9, Disp: 1 kit, Rfl: 0   busPIRone (BUSPAR) 7.5 MG tablet, Take 7.5 mg by mouth 2 (two) times daily., Disp: , Rfl:    cetirizine -pseudoephedrine  (ZYRTEC -D) 5-120 MG tablet, Take 1 tablet by mouth daily., Disp: 10 tablet, Rfl: 0   Cyanocobalamin  1000 MCG CAPS, Take 1 tablet by mouth daily., Disp: , Rfl:    ferrous sulfate  325 (65 FE) MG tablet, Take 325 mg by mouth in the morning and at bedtime., Disp: , Rfl:    FLUoxetine (PROZAC) 40 MG capsule, Take 40 mg by mouth daily., Disp: , Rfl:    fluticasone  (FLONASE ) 50 MCG/ACT nasal spray, Place 2 sprays into both nostrils daily. Shake well before use. Gently blow nose before spraying. Do not blow nose immediately after use. You should not taste the medication or feel it going down your throat; if you do, adjust your technique., Disp: 16 g, Rfl: 0   gabapentin  (NEURONTIN ) 300 MG capsule, 300 in AM and midday then 600 at night, Disp: 90 capsule, Rfl: 3   glucose blood (ACCU-CHEK GUIDE TEST) test strip, Use to check blood sugar 3x per day. E11.9, Disp: 100 each, Rfl: 12   haloperidol (HALDOL) 2 MG tablet, Take 2 mg by mouth daily., Disp: , Rfl:    hydrOXYzine  (VISTARIL ) 25 MG capsule, Take 25 mg by mouth every evening. Every evening as needed, Disp: , Rfl:    linaclotide  (LINZESS ) 72 MCG capsule, Take 1 capsule (72 mcg  total) by mouth daily before breakfast., Disp: 90 capsule, Rfl: 0   meclizine  (ANTIVERT ) 12.5 MG tablet, Take 1 tablet (12.5 mg total) by mouth 3 (three) times daily as needed for dizziness., Disp: 20 tablet, Rfl: 0   metFORMIN  (GLUCOPHAGE ) 500 MG tablet, Take 1 tablet (500 mg total) by mouth 2 (two) times daily with a meal., Disp: 60 tablet, Rfl: 1   metoprolol  tartrate (LOPRESSOR ) 25 MG tablet, TAKE ONE-HALF TABLET BY MOUTH TWICE DAILY AS NEEDED, Disp: 90 tablet, Rfl: 3   nystatin  (MYCOSTATIN /NYSTOP ) powder, Apply 1 Application topically 3 (three) times daily., Disp: 15 g, Rfl: 11   OLANZapine (ZYPREXA) 20 MG tablet,  Take 20 mg by mouth at bedtime., Disp: , Rfl:    ondansetron  (ZOFRAN ) 4 MG tablet, Take 1 tablet (4 mg total) by mouth every 8 (eight) hours as needed for nausea or vomiting., Disp: 10 tablet, Rfl: 0   ondansetron  (ZOFRAN -ODT) 8 MG disintegrating tablet, Take 1 tablet (8 mg total) by mouth every 8 (eight) hours as needed for nausea or vomiting., Disp: 12 tablet, Rfl: 0   Prenatal Vit-Fe Fum-FA-Omega (PNV PRENATAL PLUS MULTIVIT+DHA) 27-1 & 312 MG MISC, 1 po qd, Disp: 90 each, Rfl: 0   Probiotic Product (ACIDOPHILUS PROBIOTIC BLEND PO), Take by mouth., Disp: , Rfl:    Semaglutide , 1 MG/DOSE, 4 MG/3ML SOPN, Inject 1 mg as directed once a week., Disp: 3 mL, Rfl: 0   traZODone (DESYREL) 50 MG tablet, Take 50 mg by mouth at bedtime as needed for sleep., Disp: , Rfl:    valsartan  (DIOVAN ) 40 MG tablet, Take 1 tablet (40 mg total) by mouth at bedtime., Disp: 30 tablet, Rfl: 3   Vitamin D , Ergocalciferol , (DRISDOL ) 1.25 MG (50000 UNIT) CAPS capsule, 1 tab q wed and 1 tab q sun, Disp: 8 capsule, Rfl: 0   Medications ordered in this encounter:  Meds ordered this encounter  Medications   fluconazole  (DIFLUCAN ) 150 MG tablet    Sig: Take 1 tablet PO once. Repeat in 3 days if needed.    Dispense:  2 tablet    Refill:  0    Supervising Provider:   LAMPTEY, PHILIP O [[id number]]     *If you need refills on other medications prior to your next appointment, please contact your pharmacy*  Follow-Up: Call back or seek an in-person evaluation if the symptoms worsen or if the condition fails to improve as anticipated.  [facility] [phone]  Other Instructions Vaginal Yeast Infection, Adult  Vaginal yeast infection is a condition that causes vaginal discharge as well as soreness, swelling, and redness (inflammation) of the vagina. This is a common condition. Some women get this infection frequently. What are the causes? This condition is caused by a change in the normal balance of the yeast  (Candida) and normal bacteria that live in the vagina. This change causes an overgrowth of yeast, which causes the inflammation. What increases the risk? The condition is more likely to develop in women who: Take antibiotic medicines. Have diabetes. Take birth control pills. Are pregnant. Douche often. Have a weak body defense system (immune system). Have been taking steroid medicines for a long time. Frequently wear tight clothing. What are the signs or symptoms? Symptoms of this condition include: White, thick, creamy vaginal discharge. Swelling, itching, redness, and irritation of the vagina. The lips of the vagina (labia) may be affected as well. Pain or a burning feeling while urinating. Pain during sex. How is this diagnosed? This  condition is diagnosed based on: Your medical history. A physical exam. A pelvic exam. Your health care provider will examine a sample of your vaginal discharge under a microscope. Your health care provider may send this sample for testing to confirm the diagnosis. How is this treated? This condition is treated with medicine. Medicines may be over-the-counter or prescription. You may be told to use one or more of the following: Medicine that is taken by mouth (orally). Medicine that is applied as a cream (topically). Medicine that is inserted directly into the vagina (suppository). Follow these instructions at home: Take or apply over-the-counter and prescription medicines only as told by your health care provider. Do not use tampons until your health care provider approves. Do not have sex until your infection has cleared. Sex can prolong or worsen your symptoms of infection. Ask your health care provider when it is safe to resume sexual activity. Keep all follow-up visits. This is important. How is this prevented?  Do not wear tight clothes, such as pantyhose or tight pants. Wear breathable cotton underwear. Do not use douches, perfumed soap,  creams, or powders. Wipe from front to back after using the toilet. If you have diabetes, keep your blood sugar levels under control. Ask your health care provider for other ways to prevent yeast infections. Contact a health care provider if: You have a fever. Your symptoms go away and then return. Your symptoms do not get better with treatment. Your symptoms get worse. You have new symptoms. You develop blisters in or around your vagina. You have blood coming from your vagina and it is not your menstrual period. You develop pain in your abdomen. Summary Vaginal yeast infection is a condition that causes discharge as well as soreness, swelling, and redness (inflammation) of the vagina. This condition is treated with medicine. Medicines may be over-the-counter or prescription. Take or apply over-the-counter and prescription medicines only as told by your health care provider. Do not douche. Resume sexual activity or use of tampons as instructed by your health care provider. Contact a health care provider if your symptoms do not get better with treatment or your symptoms go away and then return. This information is not intended to replace advice given to you by your health care provider. Make sure you discuss any questions you have with your health care provider. Document Revised: 08/16/2020 Document Reviewed: 08/16/2020 Elsevier Patient Education  2024 Elsevier Inc.   If you have been instructed to have an in-person evaluation today at a local Urgent Care facility, please use the link below. It will take you to a list of all of our available [facility], including address, phone number and hours of operation. Please do not delay care.  [facility]  If you or a family member do not have a primary care provider, use the link below to schedule a visit and establish care. When you choose a [facility] primary care physician or advanced practice provider, you gain a  long-term partner in health. Find a Primary Care Provider  Learn more about [facility]'s in-office and virtual care options: [facility] - Get Care Now

## 2024-01-06 NOTE — Patient Instructions (Signed)
 SABRA

## 2024-01-06 NOTE — Progress Notes (Signed)
 The patient no-showed for appointment despite this provider sending direct link, reaching out via phone with no response and waiting for at least 10 minutes from appointment time for patient to join. They will be marked as a NS for this appointment/time.   Laure Kidney, PA-C

## 2024-01-07 DIAGNOSIS — G5793 Unspecified mononeuropathy of bilateral lower limbs: Secondary | ICD-10-CM | POA: Diagnosis not present

## 2024-01-09 ENCOUNTER — Ambulatory Visit (HOSPITAL_BASED_OUTPATIENT_CLINIC_OR_DEPARTMENT_OTHER): Admitting: Physical Therapy

## 2024-01-09 DIAGNOSIS — G5793 Unspecified mononeuropathy of bilateral lower limbs: Secondary | ICD-10-CM | POA: Diagnosis not present

## 2024-01-10 ENCOUNTER — Ambulatory Visit (INDEPENDENT_AMBULATORY_CARE_PROVIDER_SITE_OTHER): Admitting: Podiatry

## 2024-01-10 ENCOUNTER — Encounter: Payer: Self-pay | Admitting: Podiatry

## 2024-01-10 DIAGNOSIS — D492 Neoplasm of unspecified behavior of bone, soft tissue, and skin: Secondary | ICD-10-CM

## 2024-01-10 DIAGNOSIS — B351 Tinea unguium: Secondary | ICD-10-CM | POA: Diagnosis not present

## 2024-01-10 DIAGNOSIS — M79672 Pain in left foot: Secondary | ICD-10-CM

## 2024-01-10 DIAGNOSIS — M79671 Pain in right foot: Secondary | ICD-10-CM | POA: Diagnosis not present

## 2024-01-10 NOTE — Progress Notes (Signed)
 Patient presents with complaint of burning and numbness and shooting sensations in the feet bilaterally.  Also complains of a painful lesion on the hallux left.  There is a burning and numbness in the going on for several months she is a type II diabetic.  She is currently on gabapentin  4 times daily.  Says it really has not helped at all.  Says the lesion has been there for about a month on the great toe left   Physical exam:  General appearance: Pleasant, and in no acute distress. AOx3.  Vascular: Pedal pulses: DP 2/4 bilaterally, PT 2/4 bilaterally.  Moderate edema lower legs bilaterally. Capillary fill time immediate bilaterally.  Neurological: Light touch intact feet bilaterally.  Normal Achilles reflex bilaterally.  No clonus or spasticity noted.  Positive Tinel's sign at porta pedis tarsal tunnel and cutaneous peripheral nerve at the foot and ankle bilaterally.  Vibratory sensation significantly diminished.  Sensation absent in toes 1 through 5  Dermatologic:   Verrucous lesion plantar medial aspect at IPJ of hallux left.  Skin lines go around the lesion with pinpoint hemorrhage is and tenderness with lateral pressure on the lesion.  Skin normal temperature bilaterally.  Skin normal color, tone, and texture bilaterally.   Musculoskeletal: Mild hammertoe deformities bilaterally.  Some tenderness along the plantar fascia and at the plantar medial tubercle bilaterally.  No tenderness with lateral compression of calcaneus   Diagnosis: 1.  Neuritis feet bilaterally 2.  Diabetic peripheral neuropathy bilaterally 3.  Plantar verruca left great toe  Plan: -New patient office visit for evaluation and management level 3.  Modifier 25 - Discussed the verrucous lesion on her great toe left.  Lesion discussed etiology and treatment we will start with Salinocaine on the lesion today.  Discussed other treatment options we can try if this Salinocaine does not work -Rx Lyrica 50 mg, 1 p.o. 3 times  daily for 2 weeks.  No refills - Rx Lyrica 100 mg, 1 p.o. 3 times daily, 3 refills  -Applied Salinocaine compound to lesion(s) as noted in physical exam after debriding lesions to pinpoint bleeding.  Salinocaine applied to lesion(s) and covered with an occlusive dressing with Coban wrap.  Written and oral instructions given to patient.  -Return in 2 weeks for follow-up wart -Return 3 months for follow-up neuropathy

## 2024-01-11 ENCOUNTER — Ambulatory Visit (HOSPITAL_BASED_OUTPATIENT_CLINIC_OR_DEPARTMENT_OTHER): Attending: Family Medicine | Admitting: Physical Therapy

## 2024-01-11 DIAGNOSIS — G5793 Unspecified mononeuropathy of bilateral lower limbs: Secondary | ICD-10-CM | POA: Diagnosis not present

## 2024-01-11 MED ORDER — PREGABALIN 50 MG PO CAPS: 50.0000 mg | ORAL_CAPSULE | Freq: Three times a day (TID) | ORAL | 0 refills | Status: DC

## 2024-01-11 MED ORDER — PREGABALIN 100 MG PO CAPS: 100.0000 mg | ORAL_CAPSULE | Freq: Three times a day (TID) | ORAL | 3 refills | Status: AC

## 2024-01-11 NOTE — Addendum Note (Signed)
 Addended by: CHRISTINE NORLEEN LABOR on: 01/11/2024 07:42 AM   Modules accepted: Orders

## 2024-01-14 ENCOUNTER — Telehealth: Payer: Self-pay | Admitting: Podiatry

## 2024-01-14 DIAGNOSIS — G5793 Unspecified mononeuropathy of bilateral lower limbs: Secondary | ICD-10-CM | POA: Diagnosis not present

## 2024-01-14 NOTE — Telephone Encounter (Signed)
 Patient states that she was supposed to change her script from gabapentin  to a different type of medication. She called to confirm that this new script has been ordered.and which pharmacy was it sent to

## 2024-01-15 ENCOUNTER — Other Ambulatory Visit: Payer: Self-pay | Admitting: Family Medicine

## 2024-01-15 ENCOUNTER — Other Ambulatory Visit: Payer: Self-pay | Admitting: Podiatry

## 2024-01-15 DIAGNOSIS — J302 Other seasonal allergic rhinitis: Secondary | ICD-10-CM

## 2024-01-15 MED ORDER — PREGABALIN 100 MG PO CAPS: 100.0000 mg | ORAL_CAPSULE | Freq: Three times a day (TID) | ORAL | 3 refills | Status: DC

## 2024-01-15 NOTE — Telephone Encounter (Signed)
 Patient states Lyrica  needs to be called into [facility] not [facility].

## 2024-01-16 ENCOUNTER — Telehealth: Admitting: Family Medicine

## 2024-01-16 ENCOUNTER — Telehealth: Payer: Self-pay

## 2024-01-16 ENCOUNTER — Emergency Department (HOSPITAL_BASED_OUTPATIENT_CLINIC_OR_DEPARTMENT_OTHER)
Admission: EM | Admit: 2024-01-16 | Discharge: 2024-01-16 | Disposition: A | Discharge status: Home / Self Care | Attending: Emergency Medicine | Admitting: Emergency Medicine

## 2024-01-16 ENCOUNTER — Ambulatory Visit (INDEPENDENT_AMBULATORY_CARE_PROVIDER_SITE_OTHER): Admitting: Family Medicine

## 2024-01-16 ENCOUNTER — Encounter (HOSPITAL_BASED_OUTPATIENT_CLINIC_OR_DEPARTMENT_OTHER): Payer: Self-pay

## 2024-01-16 ENCOUNTER — Other Ambulatory Visit: Payer: Self-pay

## 2024-01-16 DIAGNOSIS — Z79899 Other long term (current) drug therapy: Secondary | ICD-10-CM | POA: Insufficient documentation

## 2024-01-16 DIAGNOSIS — K0889 Other specified disorders of teeth and supporting structures: Secondary | ICD-10-CM | POA: Insufficient documentation

## 2024-01-16 DIAGNOSIS — Z7951 Long term (current) use of inhaled steroids: Secondary | ICD-10-CM | POA: Diagnosis not present

## 2024-01-16 DIAGNOSIS — J45909 Unspecified asthma, uncomplicated: Secondary | ICD-10-CM | POA: Insufficient documentation

## 2024-01-16 DIAGNOSIS — Z87442 Personal history of urinary calculi: Secondary | ICD-10-CM | POA: Insufficient documentation

## 2024-01-16 DIAGNOSIS — Z7984 Long term (current) use of oral hypoglycemic drugs: Secondary | ICD-10-CM | POA: Insufficient documentation

## 2024-01-16 DIAGNOSIS — I10 Essential (primary) hypertension: Secondary | ICD-10-CM | POA: Diagnosis not present

## 2024-01-16 DIAGNOSIS — K047 Periapical abscess without sinus: Secondary | ICD-10-CM

## 2024-01-16 DIAGNOSIS — G5793 Unspecified mononeuropathy of bilateral lower limbs: Secondary | ICD-10-CM | POA: Diagnosis not present

## 2024-01-16 MED ORDER — AMOXICILLIN-POT CLAVULANATE 875-125 MG PO TABS: 1.0000 | ORAL_TABLET | Freq: Two times a day (BID) | ORAL | 0 refills | Status: DC

## 2024-01-16 MED ORDER — KETOROLAC TROMETHAMINE 60 MG/2ML IM SOLN
60.0000 mg | Freq: Once | INTRAMUSCULAR | Status: AC
  Administered 2024-01-16: 60 mg via INTRAMUSCULAR
  Filled 2024-01-16: qty 2

## 2024-01-16 MED ORDER — ACETAMINOPHEN 500 MG PO TABS
1000.0000 mg | ORAL_TABLET | Freq: Once | ORAL | Status: AC
  Administered 2024-01-16: 1000 mg via ORAL
  Filled 2024-01-16: qty 2

## 2024-01-16 NOTE — Telephone Encounter (Signed)
 Error: I see that patient has requested this medication be removed. Nelson Land, CMA

## 2024-01-16 NOTE — ED Provider Notes (Signed)
 [place] [facility] AT [facility] Provider Note   CSN: [id number] Arrival date & time: 01/16/24  1008     Patient presents with: Dental Pain   Karen Brennan is a 48 y.o. female.   HPI     48yo female with a history of asthma, hypertension, depression, bipolar, schizophrenia, pre-DM, who presents with concern for dental pain.    Antibiotic tylenol  3 from dentist, tried to numb her but did not work Dental pain started one week ago,  on Monday was placed on the antibiotic amoxicillin  500mg  every 8 hours, was previously on augmentin  for UTI 7/21 Pain has been worsening, feeling pain from bottom left jaw and left side of head No known fever, is alternating tylenol  and ibuprofen  for pain No n/v/vision changes nor difficulty swallowing   Dentist says they cannot pull teeth until the end of September.   Past Medical History:  Diagnosis Date   ANEMIA 08/14/2008   Qualifier: Diagnosis of  By: Joyice MD, Jessica     Anxiety    Asthma    B12 deficiency 08/29/2021   Bipolar 1 disorder (HCC)    Blood transfusion    Cellulitis 07/15/2020   Of right buttock   Chest pain    Depression    Edema of both lower extremities    GERD (gastroesophageal reflux disease)    History of bronchitis    Hypertension    Kidney stones 10/09/2011   Migraines 10/09/2011   often   Morbid obesity with BMI of 50.0-59.9, adult (HCC) 08/09/2006   Qualifier: Diagnosis of   By: Joyice MD, Jessica         Pre-diabetes    Schizophrenia (HCC)    SOB (shortness of breath)    Vitamin D  deficiency 08/29/2021     Prior to Admission medications   Medication Sig Start Date End Date Taking? Authorizing Provider  acetaminophen -codeine  (TYLENOL  #3) 300-30 MG tablet Take 1 tablet by mouth every 6 (six) hours as needed. 01/15/24  Yes [provider]  amoxicillin -clavulanate (AUGMENTIN ) 875-125 MG tablet Take 1 tablet by mouth every 12 (twelve) hours. 01/16/24  Yes Dreama Longs, MD   benztropine (COGENTIN) 2 MG tablet Take 2 mg by mouth 2 (two) times daily. 01/15/24  Yes [provider]  hydrochlorothiazide  (HYDRODIURIL ) 12.5 MG tablet Take 12.5 mg by mouth daily. 01/15/24  Yes [provider]  Prenatal Vit-Fe Fumarate-FA (M-NATAL PLUS) 27-1 MG TABS Take 1 tablet by mouth daily. 01/15/24  Yes [provider]  Accu-Chek Softclix Lancets lancets USE TO test fasting blood sugar EVERY DAY IN THE MORNING 10/11/23   Everhart, Kirstie, DO  albuterol  (VENTOLIN  HFA) 108 (90 Base) MCG/ACT inhaler inhale 2 PUFFS BY MOUTH every 4 hours as needed FOR wheezing OR shortness of breath 01/16/24   Hindel, Leah, MD  ARIPiprazole  (ABILIFY ) 20 MG tablet Take 20 mg by mouth at bedtime as needed.    [provider]  atorvastatin  (LIPITOR) 20 MG tablet Take 1 tablet (20 mg total) by mouth daily. 10/11/23   Everhart, Kirstie, DO  B Complex-C-Folic Acid  (B COMPLEX-VITAMIN C-FOLIC ACID ) 1 MG tablet Take 1 tablet by mouth daily with breakfast. 09/19/23   Opalski, Barnie, DO  benztropine (COGENTIN) 1 MG tablet Take 1 mg by mouth 2 (two) times daily. 06/18/19   [provider]  blood glucose meter kit and supplies KIT Dispense based on patient and insurance preference. Use up to four times daily as directed. (FOR ICD-9 250.00, 250.01). 10/11/23  Everhart, Kirstie, DO  Blood Glucose Monitoring Suppl (ACCU-CHEK GUIDE) w/Device KIT Use to check blood sugar 3x per day. E11.9 10/12/23   Everhart, Kirstie, DO  busPIRone (BUSPAR) 7.5 MG tablet Take 7.5 mg by mouth 2 (two) times daily. 11/15/21   [provider]  cetirizine -pseudoephedrine  (ZYRTEC -D) 5-120 MG tablet Take 1 tablet by mouth daily. 12/02/23   Iola Lukes, FNP  Cyanocobalamin  1000 MCG CAPS Take 1 tablet by mouth daily. 04/11/23   [provider]  ferrous sulfate  325 (65 FE) MG tablet Take 325 mg by mouth in the morning and at bedtime. 08/25/21   [provider]  fluconazole  (DIFLUCAN ) 150 MG tablet  Take 1 tablet PO once. Repeat in 3 days if needed. 01/06/24   Gladis Elsie BROCKS, PA-C  FLUoxetine (PROZAC) 40 MG capsule Take 40 mg by mouth daily.    [provider]  fluticasone  (FLONASE ) 50 MCG/ACT nasal spray Place 2 sprays into both nostrils daily. Shake well before use. Gently blow nose before spraying. Do not blow nose immediately after use. You should not taste the medication or feel it going down your throat; if you do, adjust your technique. 12/02/23   Iola Lukes, FNP  gabapentin  (NEURONTIN ) 300 MG capsule 300 in AM and midday then 600 at night 10/05/23   Bryan Bianchi, MD  glucose blood (ACCU-CHEK GUIDE TEST) test strip Use to check blood sugar 3x per day. E11.9 10/12/23   Everhart, Kirstie, DO  haloperidol (HALDOL) 2 MG tablet Take 2 mg by mouth daily.    [provider]  hydrOXYzine  (VISTARIL ) 25 MG capsule Take 25 mg by mouth every evening. Every evening as needed 10/24/20   [provider]  linaclotide  (LINZESS ) 72 MCG capsule Take 1 capsule (72 mcg total) by mouth daily before breakfast. 11/19/23   Opalski, Barnie, DO  meclizine  (ANTIVERT ) 12.5 MG tablet Take 1 tablet (12.5 mg total) by mouth 3 (three) times daily as needed for dizziness. 07/24/23   Bryan Bianchi, MD  metFORMIN  (GLUCOPHAGE ) 500 MG tablet Take 1 tablet (500 mg total) by mouth 2 (two) times daily with a meal. 09/19/23   Opalski, Barnie, DO  metoprolol  tartrate (LOPRESSOR ) 25 MG tablet TAKE ONE-HALF TABLET BY MOUTH TWICE DAILY AS NEEDED 08/08/23   Loistine Barnie, NP  nystatin  (MYCOSTATIN /NYSTOP ) powder Apply 1 Application topically 3 (three) times daily. 07/06/23   Hindel, Rea, MD  OLANZapine (ZYPREXA) 20 MG tablet Take 20 mg by mouth at bedtime.    [provider]  ondansetron  (ZOFRAN ) 4 MG tablet Take 1 tablet (4 mg total) by mouth every 8 (eight) hours as needed for nausea or vomiting. 10/25/23   Everhart, Kirstie, DO  ondansetron  (ZOFRAN -ODT) 8 MG disintegrating tablet Take 1  tablet (8 mg total) by mouth every 8 (eight) hours as needed for nausea or vomiting. 12/02/23   Iola Lukes, FNP  pregabalin  (LYRICA ) 100 MG capsule Take 1 capsule (100 mg total) by mouth 3 (three) times daily. 01/26/24   Christine Rush, DPM  pregabalin  (LYRICA ) 100 MG capsule Take 1 capsule (100 mg total) by mouth 3 (three) times daily. 01/15/24   Christine Rush, DPM  pregabalin  (LYRICA ) 50 MG capsule Take 1 capsule (50 mg total) by mouth 3 (three) times daily for 14 days. 01/11/24 01/25/24  Christine Rush, DPM  Prenatal Vit-Fe Fum-FA-Omega (PNV PRENATAL PLUS MULTIVIT+DHA) 27-1 & 312 MG MISC 1 po qd 12/20/23   Midge Barnie, DO  Probiotic Product (ACIDOPHILUS PROBIOTIC BLEND PO) Take by mouth.  [provider]  Semaglutide , 1 MG/DOSE, 4 MG/3ML SOPN Inject 1 mg as directed once a week. 12/20/23   Midge Sober, DO  traZODone (DESYREL) 50 MG tablet Take 50 mg by mouth at bedtime as needed for sleep. 04/07/19   [provider]  valsartan  (DIOVAN ) 40 MG tablet Take 1 tablet (40 mg total) by mouth at bedtime. 10/25/23   Everhart, Kirstie, DO  Vitamin D , Ergocalciferol , (DRISDOL ) 1.25 MG (50000 UNIT) CAPS capsule 1 tab q wed and 1 tab q sun 12/20/23   Opalski, Sober, DO    Allergies: Sibutramine hcl monohydrate    Review of Systems  Updated Vital Signs BP 117/79   Pulse 67   Temp 98.1 F (36.7 C) (Oral)   Resp 18   SpO2 100%   Physical Exam Vitals and nursing note reviewed.  Constitutional:      General: She is not in acute distress.    Appearance: Normal appearance. She is not ill-appearing, toxic-appearing or diaphoretic.  HENT:     Head: Normocephalic.     Ears:     Comments: Dental caries, tenderness to lower gum, no sign of fluctuance, no sublingual swelling, tenderness to anterior left mandible, no significant swelling, no erythema, no trismus    Mouth/Throat:     Mouth: Mucous membranes are moist.  Eyes:     Conjunctiva/sclera: Conjunctivae normal.   Cardiovascular:     Rate and Rhythm: Normal rate and regular rhythm.     Pulses: Normal pulses.  Pulmonary:     Effort: Pulmonary effort is normal. No respiratory distress.  Musculoskeletal:        General: No deformity or signs of injury.     Cervical back: No rigidity.  Skin:    General: Skin is warm and dry.     Coloration: Skin is not jaundiced or pale.  Neurological:     General: No focal deficit present.     Mental Status: She is alert and oriented to person, place, and time.     (all labs ordered are listed, but only abnormal results are displayed) Labs Reviewed - No data to display  EKG: None  Radiology: No results found.   Procedures   Medications Ordered in the ED  ketorolac  (TORADOL ) injection 60 mg (60 mg Intramuscular Given 01/16/24 1307)  acetaminophen  (TYLENOL ) tablet 1,000 mg (1,000 mg Oral Given 01/16/24 1307)                                     47yo female with a history of asthma, hypertension, depression, bipolar, schizophrenia, pre-DM, who presents with concern for dental pain, started antibiotics on Monday.    Exam without signs of Ludwig's angina, no sign of extension of infection into deep space of facial muscles nor neck. No trismus, no fever or significant swelling.  Has continued dental pain. At this time do not see indication for CT imaging or IV abx.  Will give augmentin  rx, recommend dental follow up-given resource list. There is not oral surgeon/adult dentist on call at this time for follow up. Patient discharged in stable condition with understanding of reasons to return.      Final diagnoses:  Pain, dental    ED Discharge Orders          Ordered    amoxicillin -clavulanate (AUGMENTIN ) 875-125 MG tablet  Every 12 hours        01/16/24 1250  Dreama Longs, MD 01/16/24 2213

## 2024-01-16 NOTE — Progress Notes (Signed)
 [place]   Worsening dental infection on oral meds and OTC pain without relief. Swelling as well. Advised ED for imagining, and check for possible IV ABX need and or another medication.    Patient acknowledged agreement and understanding of the plan.

## 2024-01-16 NOTE — ED Triage Notes (Signed)
 Patient reports dental pain on the left side. She has been seen for it by her dentist and they gave her antibiotics and pain medication. She reports neither are working and her dentist told her to come in because she may require IV antibiotics. No airway compromise, slight swelling noted to left jaw and face.

## 2024-01-18 ENCOUNTER — Ambulatory Visit (HOSPITAL_BASED_OUTPATIENT_CLINIC_OR_DEPARTMENT_OTHER): Attending: Family Medicine | Admitting: Physical Therapy

## 2024-01-18 ENCOUNTER — Encounter (HOSPITAL_BASED_OUTPATIENT_CLINIC_OR_DEPARTMENT_OTHER): Payer: Self-pay | Admitting: Physical Therapy

## 2024-01-18 DIAGNOSIS — M19172 Post-traumatic osteoarthritis, left ankle and foot: Secondary | ICD-10-CM | POA: Diagnosis present

## 2024-01-18 DIAGNOSIS — M25372 Other instability, left ankle: Secondary | ICD-10-CM | POA: Diagnosis present

## 2024-01-18 DIAGNOSIS — G5793 Unspecified mononeuropathy of bilateral lower limbs: Secondary | ICD-10-CM | POA: Diagnosis not present

## 2024-01-18 NOTE — Therapy (Signed)
 OUTPATIENT PHYSICAL THERAPY TREATMENT      Patient Name: Karen Brennan MRN: [id number] DOB:1976-01-24, 48 y.o., female Today's Date: 01/18/2024  END OF SESSION:  PT End of Session - 01/18/24 0853     Visit Number 11    Number of Visits 20    Date for PT Re-Evaluation 02/15/24    Authorization Type [employer] MCD    PT Start Time 0849    PT Stop Time 0930    PT Time Calculation (min) 41 min    Activity Tolerance Patient tolerated treatment well    Behavior During Therapy [facility] for tasks assessed/performed            Past Medical History:  Diagnosis Date   ANEMIA 08/14/2008   Qualifier: Diagnosis of  By: Joyice MD, Jessica     Anxiety    Asthma    B12 deficiency 08/29/2021   Bipolar 1 disorder (HCC)    Blood transfusion    Cellulitis 07/15/2020   Of right buttock   Chest pain    Depression    Edema of both lower extremities    GERD (gastroesophageal reflux disease)    History of bronchitis    Hypertension    Kidney stones 10/09/2011   Migraines 10/09/2011   often   Morbid obesity with BMI of 50.0-59.9, adult (HCC) 08/09/2006   Qualifier: Diagnosis of   By: Joyice MD, Jessica         Pre-diabetes    Schizophrenia (HCC)    SOB (shortness of breath)    Vitamin D  deficiency 08/29/2021   Past Surgical History:  Procedure Laterality Date   ANKLE FRACTURE SURGERY   1990's   left; put screws in   ENDOMETRIAL ABLATION  ~ 2011   FRACTURE SURGERY     GASTRIC BY PASS     TUBAL LIGATION  2006   Patient Active Problem List   Diagnosis Date Noted   Severe dysplasia of cervix (CIN III) 08/20/2023   Open wound of foot, right, initial encounter 06/25/2023   Plantar fasciitis 06/25/2023   HGSIL (high grade squamous intraepithelial lesion) on Pap smear of cervix 06/01/2023   BMI 45.0-49.9, adult (HCC)-current bmi 46.8 07/12/2022   Chronic constipation 06/14/2022   Seasonal allergies 05/01/2022   Hyperlipidemia associated with type 2 diabetes mellitus (HCC) 12/05/2021    Type II diabetes mellitus (HCC) 08/29/2021   PVC's (premature ventricular contractions) 07/19/2021   Intertrigo 02/16/2020   Neuropathic pain of both feet 05/09/2016   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   Acute blood loss anemia 11/14/2011   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 Include strengthening exercises and aquatic therapy in treatment plan.   Rationale for Evaluation and Treatment: Rehabilitation  THERAPY DIAG:  Instability of left ankle joint  Post-traumatic osteoarthritis of left ankle  ONSET DATE: 2-3 mo ago (around Feb 2025) acute on chronic   SUBJECTIVE:  SUBJECTIVE STATEMENT: Pt has had to cancel last couple of appts due to illness circulation through her home.  No ankle pain today 0/10.  LBP 6/10   POOL ACCESS:  [employer]  From initial evaluation: Broke my ankle when I was a teenager, hardware removed in the past. It swells really bad. I usually wear compression socks and ASO with tennis shoes. Sometimes limited in shoe wear by ankle edema. Two falls in the last 2 months due to dizziness, blacked out and fell on knees. On BP meds- changing due to recent weight loss.   PERTINENT HISTORY:  Peripheral neuropathy-  reports occasional sensation of pressure of floor on foot, gabapentin  for management.   PAIN:  Are you having pain? Yes: NPRS scale: 5/10 Pain location: L ankle and lower leg  Pain description: tight, tender Aggravating factors: standing too long Relieving factors: ibuprofen , tylenol   PRECAUTIONS:  Fall  RED FLAGS: None   WEIGHT BEARING RESTRICTIONS:  No  FALLS:  Has patient fallen in last 6 months? Yes. Number of falls 5  LIVING ENVIRONMENT: Lives with: lives with their daughter x3 Lives in: House/apartment- no stairs  OCCUPATION:  On disability  PLOF:  Sometimes needs help with bathing and getting off the couch  PATIENT GOALS:  Working on weight loss   OBJECTIVE:  Note: Objective measures were completed at Evaluation unless otherwise noted.  DIAGNOSTIC FINDINGS:  WFL  PATIENT SURVEYS:  LEFS  Extreme difficulty/unable (0), Quite a bit of difficulty (1), Moderate difficulty (2), Little difficulty (3), No difficulty (4) Survey date:  5/23 12/20/23  Any of your usual work, housework or school activities 1 2  2. Usual hobbies, recreational or sporting activities 0 1  3. Getting into/out of the bath 0 2  4. Walking between rooms 1 3  5. Putting on socks/shoes 1 2  6. Squatting  0 0  7. Lifting an object, like a bag of groceries from the floor 1 1  8. Performing light activities around your home 1 2  9. Performing heavy activities around your home 0 1  10. Getting into/out of a car 0 2  11. Walking 2 blocks 0 0  12. Walking 1 mile 0 0  13. Going up/down 10 stairs (1 flight) 0 0  14. Standing for 1 hour 0 0  15.  sitting for 1 hour 0 2  16. Running on even ground 0 1  17. Running on uneven ground 0 1  18. Making sharp turns while running fast 0 0  19. Hopping  0 0  20. Rolling over in bed 1 2  Score total:  6 22     COGNITIVE STATUS: Within functional limits for tasks assessed   SENSATION: Limited due to peripheral neuropathy in bilat  feet  EDEMA:  Yes: Lt lateral maleolus   GAIT: Eval: flexed posture, lacking DF at heel strike bilat, antalgic, slow cadence.    Body Part #1 Ankle  EVAL ankle AROM DF: knee flexed-3, knee ext -10  Able to stand with DF at -3 deg, unable to demo standing toe raise   12/10/23:  L ankle DF with knee ext -10  2/89/74 Re-cert ankle AROM: knee flex -3 , knee extended -8 FUNCTIONAL TESTS:  Lars Balance Scale:  Item Test date: 5/23EVAL Test date: 12/20/23 Test date:   Sitting to standing 1. needs minimal aid to stand or stabilize 2. able to stand using hands after several tries  4. able to stand safely for 2 minutes  4. able to sit safely and securely for 2 minutes   3. controls descent by using hands  3. able to transfer safely with definite need of hands  4. able to stand 10 seconds safely  4. able to place feet together independently and stand 1 minute safely  4. can reach forward confidently 25 cm (10 inches)    3. able to pick up slipper but needs supervision  4. looks behind from both sides and weight shifts well    2. able to turn 360 degrees safely but slowly  0. needs assistance to keep from falling/unable to try    0. loses balance while stepping or standing   1    Insert OPRCBERGREEVAL SmartPhrase at re-test date  2. Standing unsupported 4. able to stand safely for 2 minutes    3. Sitting with back unsupported, feet supported 4. able to sit safely and securely for 2 minutes    4. Standing to sitting 1. sits independently but has uncontrolled descent    5. Pivot transfer  3. able to transfer safely with definite need of hands    6. Standing unsupported with eyes closed 4. able to stand 10 seconds safely    7. Standing unsupported with feet together 4. able to place feet together independently and stand 1 minute safely    8. Reaching forward with outstretched arms while standing 3. can reach forward 12 cm (5 inches)    9. Pick up object from the floor from standing  3. able to pick up slipper but needs supervision    10. Turning to look behind over left and right shoulders while standing 3. looks behind one side only, other side shows less weight shift    11. Turn 360 degrees 2. able to turn 360 degrees safely but slowly    12. Place alternate foot on step or stool while standing unsupported 2. able to complete 4 steps without aid with supervision    13. Standing unsupported one foot in front 3. able to place foot ahead independently and hold 30 seconds    14. Standing on one leg 1. tries to lift leg unable to hold 3 seconds but remains standing independently.      Total Score 38/56 Total Score 38/56 Total Score /56                                                                                                                                 TREATMENT DATE:   [facility] Adult PT Treatment:                                             01/18/24 Pt seen for aquatic therapy today.  Treatment took place in water 3.5-4.75 ft in depth at the [facility] pool. Temp of water was 91.  Pt entered/exited the pool via stairs independently in step-to pattern with bilat rail.  - unsupported walking forward/ backward (in deepest water) - cues for heel/toe and toe/heel, multiple laps - unsupported side stepping  -Standing ue support wall: df; pf; relaxed squats; traditional squats -Toe walking forward and back -heel walking forward and back -gastroc stretching on bottom step -Forward and backward tandem walking ue support yellow HB (difficult good challenge) -Step ups onto bottom step leading r/l 2x 5. Good toleration bilat support on hand rail -cycling on noodle -walking between exercises for recovery   * once dried off applied dynamic tape to L dorsum of foot, and ankle in 2 stirrups, one crossing at talus to provide increased proprioception. Pt instructed in safe tape removal technique; verbalized understanding.   Pt requires the buoyancy and hydrostatic  pressure of water for support, and to offload joints by unweighting joint load by at least 50 % in navel deep water and by at least 75-80% in chest to neck deep water.  Viscosity of the water is needed for resistance of strengthening. Water current perturbations provides challenge to standing balance requiring increased core activation.   Manual: Talor glides Edema massage  DF stretch with distraction   There-ex:  Ankle pump with band 2x10  DF yellow 2x10   Hip abdcution red band 2x10  Knee extension red band 2x10       PATIENT EDUCATION:  Education details: intro to aquatic therapy  Person educated: Patient Education method: Explanation, Demonstration, Tactile cues, and Verbal cues Education comprehension: verbalized understanding, returned demonstration, verbal cues required, tactile cues required, and needs further education  HOME EXERCISE PROGRAM: To be established at pool   ASSESSMENT:  CLINICAL IMPRESSION:  Pt continues with edema left ankle but reports no pain today. She did try using frozen water bottle on plantar surfaces of foot with reports that it helped some. Worked ankle strengthening.  Tandem walking good challenge engaging ankle musculature but is highly fatiguing.  No reports of pain throughout session.  Goals ongoing    PN: Pt has made some minor improvements in left ankle ROM. She has met her Lefs goal and reports increased mobility around home.  She continues to have deficits in balance with no improvement on Berg but reports no falls. Her pain has continued to be variable without an overall reduction in max pain in left ankle. She has continued to lose weight adding another 17lbs to her overall  weight lose.  Plan to increase focus on central mobility (core strength and balance). She will continue to benefit form skilled aquatic PT progressing towards all stated goals.   From initial evaluation:  Patient is a 48 y.o. F who was seen today for physical  therapy evaluation and treatment for Lt ankle pain- subacute on chronic onset. Pt has recently lost 130lb and has very limited central stability leading to distal pain and lack of control. Able to ambulate but lacks DF which puts her at a trip risk. Pt will benefit significantly from aquatic therapy for gross biomechanical chain strengthening and endurance challenge. I did introduce her to aquatics today and walked her back to the pool.     OBJECTIVE IMPAIRMENTS: Abnormal gait, decreased activity tolerance, decreased balance, decreased coordination, decreased mobility, difficulty walking, decreased ROM, decreased strength, dizziness, increased edema, impaired flexibility, impaired sensation, improper body mechanics, obesity, and pain.   ACTIVITY LIMITATIONS: standing, squatting, stairs, transfers, bed mobility, bathing, toileting, hygiene/grooming, and locomotion level  PARTICIPATION LIMITATIONS: cleaning, laundry, shopping,  and community activity  PERSONAL FACTORS: obesity & weight changes, blood pressure changes/medication management, peripheral neuropathy, recent falls are also affecting patient's functional outcome.   REHAB POTENTIAL: Good  CLINICAL DECISION MAKING: Unstable/unpredictable  EVALUATION COMPLEXITY: High   GOALS: Goals reviewed with patient? Yes  SHORT TERM GOALS: Target date: 6/13  Verbalize awareness of core engagement in exercises Baseline: will progress as appropriate Goal status: Met 11/26/23  2.  Able to navigate stairs into/out of pool with proper, reciprocal form Baseline: will progress as appropriate Goal status: IN PROGRESS- 11/28/23  (step-to pattern in; step through pattern out)   LONG TERM GOALS: Target date: 02/15/24  BERG to improve by MDC (5 points) Baseline: see obj Goal status: In Progress 12/20/23  2.  LEFS to improve by MDC Baseline: see obj Goal status:Met 12/20/23  3.  Able to stand from standard chair without UEs Baseline: unable at  eval Goal status: In progress - 12/10/23; 12/20/23  4.   Active DF to, at least, neutral with knee extended Baseline: see obj Goal status: In progress 12/20/23    PLAN:  PT FREQUENCY: 1-2x/week  PT DURATION:8 weeks alternating land and aquatics (extending time due to conflicts on scheduling 6 land visits, 6 water visits.  PLANNED INTERVENTIONS: 97164- PT Re-evaluation, 97750- Physical Performance Testing, 97110-Therapeutic exercises, 97530- Therapeutic activity, [id number]- Neuromuscular re-education, 97535- Self Care, 02859- Manual therapy, [phone]- Gait training, [phone]- Aquatic Therapy, Patient/Family education, Balance training, Stair training, Taping, Dry Needling, Joint mobilization, Spinal mobilization, Cryotherapy, and Moist heat.  PLAN FOR NEXT SESSION: continue aquatic therapy with transition onto land for added load for strengthening  Ronal Foots) Marc Sivertsen MPT 12/24/23 9:37 AM [facility] [address] [place], [place], [address] Phone: [phone]   Fax:  [phone]     For all possible CPT codes, reference the Planned Interventions line above.     Check all conditions that are expected to impact treatment: {Conditions expected to impact treatment:Morbid obesity and Neurological condition and/or seizures   If treatment provided at initial evaluation, no treatment charged due to lack of authorization.

## 2024-01-21 DIAGNOSIS — G5793 Unspecified mononeuropathy of bilateral lower limbs: Secondary | ICD-10-CM | POA: Diagnosis not present

## 2024-01-22 ENCOUNTER — Ambulatory Visit (HOSPITAL_BASED_OUTPATIENT_CLINIC_OR_DEPARTMENT_OTHER): Admitting: Physical Therapy

## 2024-01-23 DIAGNOSIS — G5793 Unspecified mononeuropathy of bilateral lower limbs: Secondary | ICD-10-CM | POA: Diagnosis not present

## 2024-01-24 ENCOUNTER — Ambulatory Visit (HOSPITAL_BASED_OUTPATIENT_CLINIC_OR_DEPARTMENT_OTHER): Admitting: Physical Therapy

## 2024-01-24 ENCOUNTER — Telehealth (HOSPITAL_BASED_OUTPATIENT_CLINIC_OR_DEPARTMENT_OTHER): Payer: Self-pay | Admitting: Physical Therapy

## 2024-01-24 ENCOUNTER — Ambulatory Visit: Admitting: Podiatry

## 2024-01-24 NOTE — Telephone Encounter (Signed)
 Patient did not show for aquatic PT appointment.  Called and left voicemail regarding missed appointment and reminded her of upcoming appointment on Tuesday 8/19.  Requested patient return phone call to confirm she will attend next appointment.    Delon Aquas, PTA 01/24/24 9:51 AM [facility] [address] [place], [place], [address] Phone: [phone]   Fax:  [phone]

## 2024-01-25 DIAGNOSIS — G5793 Unspecified mononeuropathy of bilateral lower limbs: Secondary | ICD-10-CM | POA: Diagnosis not present

## 2024-01-28 DIAGNOSIS — G5793 Unspecified mononeuropathy of bilateral lower limbs: Secondary | ICD-10-CM | POA: Diagnosis not present

## 2024-01-29 ENCOUNTER — Ambulatory Visit (HOSPITAL_BASED_OUTPATIENT_CLINIC_OR_DEPARTMENT_OTHER): Admitting: Physical Therapy

## 2024-01-30 DIAGNOSIS — G5793 Unspecified mononeuropathy of bilateral lower limbs: Secondary | ICD-10-CM | POA: Diagnosis not present

## 2024-02-01 ENCOUNTER — Ambulatory Visit (HOSPITAL_BASED_OUTPATIENT_CLINIC_OR_DEPARTMENT_OTHER): Admitting: Physical Therapy

## 2024-02-01 DIAGNOSIS — G5793 Unspecified mononeuropathy of bilateral lower limbs: Secondary | ICD-10-CM | POA: Diagnosis not present

## 2024-02-04 ENCOUNTER — Ambulatory Visit
Admission: RE | Admit: 2024-02-04 | Discharge: 2024-02-04 | Disposition: A | Discharge status: Home / Self Care | Source: Ambulatory Visit | Attending: Emergency Medicine | Admitting: Emergency Medicine

## 2024-02-04 VITALS — BP 102/72 | HR 79 | Temp 98.1°F | Resp 18 | Ht 73.0 in | Wt 330.0 lb

## 2024-02-04 DIAGNOSIS — G5793 Unspecified mononeuropathy of bilateral lower limbs: Secondary | ICD-10-CM | POA: Diagnosis not present

## 2024-02-04 DIAGNOSIS — N3000 Acute cystitis without hematuria: Secondary | ICD-10-CM

## 2024-02-04 LAB — POCT URINE DIPSTICK
Bilirubin, UA: NEGATIVE
Blood, UA: NEGATIVE
Glucose, UA: NEGATIVE mg/dL
Ketones, POC UA: NEGATIVE mg/dL
Nitrite, UA: NEGATIVE
POC PROTEIN,UA: NEGATIVE
Spec Grav, UA: 1.015 (ref 1.010–1.025)
Urobilinogen, UA: 0.2 U/dL
pH, UA: 7 (ref 5.0–8.0)

## 2024-02-04 MED ORDER — NITROFURANTOIN MONOHYD MACRO 100 MG PO CAPS: 100.0000 mg | ORAL_CAPSULE | Freq: Two times a day (BID) | ORAL | 0 refills | Status: AC

## 2024-02-04 NOTE — ED Triage Notes (Signed)
 I have been having UTI's for a minute, my right lower back is hurting with a strong odor in my urine. No fever.

## 2024-02-04 NOTE — ED Provider Notes (Signed)
 [facility]    CSN: [id number] Arrival date & time: 02/04/24  9148      History   Chief Complaint Chief Complaint  Patient presents with   Back Pain    Might have an uti - Entered by patient   UTI Symptoms    HPI Karen Brennan is a 48 y.o. female.  Patient past history significant for recurrent UTIs presents to urgent care with concerns of urinary discomfort.  Reports that is having ongoing for the last several days and feels fairly consistent with her prior UTIs.  Denies any fevers, chills or bodyaches.  Endorses slight nausea but no vomiting.  No reported hematuria.  Does have some slight pain to the left lower back.  Has previously had kidney stones but states that this pain does not feel similar to her kidney stone pain.  Denies any issues urinary retention.   Back Pain   Past Medical History:  Diagnosis Date   ANEMIA 08/14/2008   Qualifier: Diagnosis of  By: Joyice MD, Jessica     Anxiety    Asthma    B12 deficiency 08/29/2021   Bipolar 1 disorder (HCC)    Blood transfusion    Cellulitis 07/15/2020   Of right buttock   Chest pain    Depression    Edema of both lower extremities    GERD (gastroesophageal reflux disease)    History of bronchitis    Hypertension    Kidney stones 10/09/2011   Migraines 10/09/2011   often   Morbid obesity with BMI of 50.0-59.9, adult (HCC) 08/09/2006   Qualifier: Diagnosis of   By: Joyice MD, Jessica         Pre-diabetes    Schizophrenia (HCC)    SOB (shortness of breath)    Vitamin D  deficiency 08/29/2021    Patient Active Problem List   Diagnosis Date Noted   Severe dysplasia of cervix (CIN III) 08/20/2023   Open wound of foot, right, initial encounter 06/25/2023   Plantar fasciitis 06/25/2023   HGSIL (high grade squamous intraepithelial lesion) on Pap smear of cervix 06/01/2023   BMI 45.0-49.9, adult (HCC)-current bmi 46.8 07/12/2022   Chronic constipation 06/14/2022   Seasonal allergies 05/01/2022    Hyperlipidemia associated with type 2 diabetes mellitus (HCC) 12/05/2021   Type II diabetes mellitus (HCC) 08/29/2021   PVC's (premature ventricular contractions) 07/19/2021   Intertrigo 02/16/2020   Neuropathic pain of both feet 05/09/2016   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   Acute blood loss anemia 11/14/2011   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   Morbid obesity (HCC) 11/08/2011   Asthma 11/08/2011   Bronchitis 11/08/2011   Anxiety 10/25/2011   Urinary tract infection with hematuria 10/12/2011   Bipolar 1 disorder, mixed (HCC) 09/15/2011   Allergic rhinitis 08/31/2008   GERD (gastroesophageal reflux disease) 01/24/2008   Hypertension 08/17/2006    Past Surgical History:  Procedure Laterality Date   ANKLE FRACTURE SURGERY   1990's   left; put screws in   ENDOMETRIAL ABLATION  ~ 2011   FRACTURE SURGERY     GASTRIC BY PASS     TUBAL LIGATION  2006    OB History     Gravida  10   Para  8   Term  8   Preterm      AB  2   Living  4      SAB  1   IAB      Ectopic  1  Multiple      Live Births  4            Home Medications    Prior to Admission medications   Medication Sig Start Date End Date Taking? Authorizing Provider  Accu-Chek Softclix Lancets lancets USE TO test fasting blood sugar EVERY DAY IN THE MORNING 10/11/23   Everhart, Kirstie, DO  acetaminophen -codeine  (TYLENOL  #3) 300-30 MG tablet Take 1 tablet by mouth every 6 (six) hours as needed. 01/15/24   [provider]  albuterol  (VENTOLIN  HFA) 108 (90 Base) MCG/ACT inhaler inhale 2 PUFFS BY MOUTH every 4 hours as needed FOR wheezing OR shortness of breath 01/16/24   Hindel, Rea, MD  amoxicillin -clavulanate (AUGMENTIN ) 875-125 MG tablet Take 1 tablet by mouth every 12 (twelve) hours. 01/16/24   Dreama Longs, MD  ARIPiprazole  (ABILIFY ) 20 MG tablet Take 20 mg by mouth at bedtime as needed.    [provider]  atorvastatin  (LIPITOR) 20  MG tablet Take 1 tablet (20 mg total) by mouth daily. 10/11/23   Everhart, Kirstie, DO  B Complex-C-Folic Acid  (B COMPLEX-VITAMIN C-FOLIC ACID ) 1 MG tablet Take 1 tablet by mouth daily with breakfast. 09/19/23   Opalski, Barnie, DO  benztropine (COGENTIN) 1 MG tablet Take 1 mg by mouth 2 (two) times daily. 06/18/19   [provider]  benztropine (COGENTIN) 2 MG tablet Take 2 mg by mouth 2 (two) times daily. 01/15/24   [provider]  blood glucose meter kit and supplies KIT Dispense based on patient and insurance preference. Use up to four times daily as directed. (FOR ICD-9 250.00, 250.01). 10/11/23   Everhart, Kirstie, DO  Blood Glucose Monitoring Suppl (ACCU-CHEK GUIDE) w/Device KIT Use to check blood sugar 3x per day. E11.9 10/12/23   Everhart, Kirstie, DO  busPIRone (BUSPAR) 7.5 MG tablet Take 7.5 mg by mouth 2 (two) times daily. 11/15/21   [provider]  cetirizine -pseudoephedrine  (ZYRTEC -D) 5-120 MG tablet Take 1 tablet by mouth daily. 12/02/23   Iola Lukes, FNP  Cyanocobalamin  1000 MCG CAPS Take 1 tablet by mouth daily. 04/11/23   [provider]  ferrous sulfate  325 (65 FE) MG tablet Take 325 mg by mouth in the morning and at bedtime. 08/25/21   [provider]  fluconazole  (DIFLUCAN ) 150 MG tablet Take 1 tablet PO once. Repeat in 3 days if needed. 01/06/24   Gladis Elsie BROCKS, PA-C  FLUoxetine (PROZAC) 40 MG capsule Take 40 mg by mouth daily.    [provider]  fluticasone  (FLONASE ) 50 MCG/ACT nasal spray Place 2 sprays into both nostrils daily. Shake well before use. Gently blow nose before spraying. Do not blow nose immediately after use. You should not taste the medication or feel it going down your throat; if you do, adjust your technique. 12/02/23   Iola Lukes, FNP  glucose blood (ACCU-CHEK GUIDE TEST) test strip Use to check blood sugar 3x per day. E11.9 10/12/23   Everhart, Kirstie, DO  haloperidol (HALDOL) 2 MG tablet Take 2 mg by  mouth daily.    [provider]  hydrochlorothiazide  (HYDRODIURIL ) 12.5 MG tablet Take 12.5 mg by mouth daily. 01/15/24   [provider]  hydrOXYzine  (VISTARIL ) 25 MG capsule Take 25 mg by mouth every evening. Every evening as needed 10/24/20   [provider]  linaclotide  (LINZESS ) 72 MCG capsule Take 1 capsule (72 mcg total) by mouth daily before breakfast. 11/19/23   Opalski, Barnie, DO  meclizine  (ANTIVERT ) 12.5 MG tablet Take 1 tablet (12.5  mg total) by mouth 3 (three) times daily as needed for dizziness. 07/24/23   Bryan Bianchi, MD  metFORMIN  (GLUCOPHAGE ) 500 MG tablet Take 1 tablet (500 mg total) by mouth 2 (two) times daily with a meal. 09/19/23   Opalski, Barnie, DO  metoprolol  tartrate (LOPRESSOR ) 25 MG tablet TAKE ONE-HALF TABLET BY MOUTH TWICE DAILY AS NEEDED 08/08/23   Loistine, Barnie, NP  nitrofurantoin , macrocrystal-monohydrate, (MACROBID ) 100 MG capsule Take 1 capsule (100 mg total) by mouth 2 (two) times daily for 5 days. 02/04/24 02/09/24 Yes Jhostin Epps A, PA-C  nystatin  (MYCOSTATIN /NYSTOP ) powder Apply 1 Application topically 3 (three) times daily. 07/06/23   Hindel, Rea, MD  OLANZapine (ZYPREXA) 20 MG tablet Take 20 mg by mouth at bedtime.    [provider]  ondansetron  (ZOFRAN ) 4 MG tablet Take 1 tablet (4 mg total) by mouth every 8 (eight) hours as needed for nausea or vomiting. 10/25/23   Everhart, Kirstie, DO  ondansetron  (ZOFRAN -ODT) 8 MG disintegrating tablet Take 1 tablet (8 mg total) by mouth every 8 (eight) hours as needed for nausea or vomiting. 12/02/23   Iola Lukes, FNP  pregabalin  (LYRICA ) 100 MG capsule Take 1 capsule (100 mg total) by mouth 3 (three) times daily. 01/26/24   Christine Rush, DPM  pregabalin  (LYRICA ) 50 MG capsule Take 1 capsule (50 mg total) by mouth 3 (three) times daily for 14 days. 01/11/24 01/25/24  Christine Rush, DPM  Prenatal Vit-Fe Fum-FA-Omega (PNV PRENATAL PLUS MULTIVIT+DHA) 27-1 & 312 MG MISC 1 po qd 12/20/23    Opalski, Barnie, DO  Prenatal Vit-Fe Fumarate-FA (M-NATAL PLUS) 27-1 MG TABS Take 1 tablet by mouth daily. 01/15/24   [provider]  Probiotic Product (ACIDOPHILUS PROBIOTIC BLEND PO) Take by mouth.    [provider]  Semaglutide , 1 MG/DOSE, 4 MG/3ML SOPN Inject 1 mg as directed once a week. 12/20/23   Midge Barnie, DO  traZODone (DESYREL) 50 MG tablet Take 50 mg by mouth at bedtime as needed for sleep. 04/07/19   [provider]  valsartan  (DIOVAN ) 40 MG tablet Take 1 tablet (40 mg total) by mouth at bedtime. 10/25/23   Everhart, Kirstie, DO  Vitamin D , Ergocalciferol , (DRISDOL ) 1.25 MG (50000 UNIT) CAPS capsule 1 tab q wed and 1 tab q sun 12/20/23   Opalski, Barnie, DO    Family History Family History  Problem Relation Age of Onset   Heart failure Mother    Hyperlipidemia Mother    Hypertension Mother    Diabetes Mother    Sudden death Mother    Depression Mother    Bipolar disorder Mother    Schizophrenia Mother    Hypertension Father    Colon cancer Neg Hx    Esophageal cancer Neg Hx    Rectal cancer Neg Hx    Stomach cancer Neg Hx     Social History Social History   Tobacco Use   Smoking status: Former    Current packs/day: 0.00    Average packs/day: 0.3 packs/day for 4.0 years (1.0 ttl pk-yrs)    Types: Cigarettes    Start date: 06/12/2000    Quit date: 06/12/2004    Years since quitting: 19.6   Smokeless tobacco: Never  Vaping Use   Vaping status: Never Used  Substance Use Topics   Alcohol use: No   Drug use: No     Allergies   Sibutramine hcl monohydrate   Review of Systems Review of Systems  Musculoskeletal:  Positive for back pain.  All  other systems reviewed and are negative.    Physical Exam Triage Vital Signs ED Triage Vitals  Encounter Vitals Group     BP --      Girls Systolic BP Percentile --      Girls Diastolic BP Percentile --      Boys Systolic BP Percentile --      Boys Diastolic BP Percentile --       Pulse --      Resp --      Temp --      Temp src --      SpO2 --      Weight 02/04/24 0941 (!) 330 lb (149.7 kg)     Height 02/04/24 0941 6' 1 (1.854 m)     Head Circumference --      Peak Flow --      Pain Score 02/04/24 0940 8     Pain Loc --      Pain Education --      Exclude from Growth Chart --    No data found.  Updated Vital Signs BP 102/72 (BP Location: Left Arm)   Pulse 79   Temp 98.1 F (36.7 C) (Oral)   Resp 18   Ht 6' 1 (1.854 m)   Wt (!) 330 lb (149.7 kg)   LMP  (LMP Unknown)   SpO2 95%   BMI 43.54 kg/m   Visual Acuity Right Eye Distance:   Left Eye Distance:   Bilateral Distance:    Right Eye Near:   Left Eye Near:    Bilateral Near:     Physical Exam Vitals and nursing note reviewed.  Constitutional:      General: She is not in acute distress.    Appearance: She is well-developed.  HENT:     Head: Normocephalic and atraumatic.  Eyes:     Conjunctiva/sclera: Conjunctivae normal.  Cardiovascular:     Rate and Rhythm: Normal rate and regular rhythm.     Heart sounds: No murmur heard. Pulmonary:     Effort: Pulmonary effort is normal. No respiratory distress.     Breath sounds: Normal breath sounds.  Abdominal:     General: There is no distension.     Palpations: Abdomen is soft.     Tenderness: There is no abdominal tenderness. There is no right CVA tenderness, left CVA tenderness or guarding.  Musculoskeletal:        General: No swelling.     Cervical back: Neck supple.  Skin:    General: Skin is warm and dry.     Capillary Refill: Capillary refill takes less than 2 seconds.  Neurological:     Mental Status: She is alert.  Psychiatric:        Mood and Affect: Mood normal.      UC Treatments / Results  Labs (all labs ordered are listed, but only abnormal results are displayed) Labs Reviewed  POCT URINE DIPSTICK - Abnormal; Notable for the following components:      Result Value   Color, UA light yellow (*)    Clarity, UA  cloudy (*)    Leukocytes, UA Small (1+) (*)    All other components within normal limits  URINE CULTURE    EKG   Radiology No results found.  Procedures Procedures (including critical care time)  Medications Ordered in UC Medications - No data to display  Initial Impression / Assessment and Plan / UC Course  I have reviewed the triage vital signs  and the nursing notes.  Pertinent labs & imaging results that were available during my care of the patient were reviewed by me and considered in my medical decision making (see chart for details).  This patient presents to the UC for concern of dysuria.  Differential diagnosis includes UTI, pyelonephritis, urolithiasis, overactive bladder, interstitial cystitis   Lab Tests:  I Ordered, and personally interpreted labs.  The pertinent results include: UA with likely developing infection with cloudy color and leukocytes present but no nitrates or blood seen.   Problem List / UC Course:  Patient presents to urgent care with concerns of dysuria.  Reports symptoms ongoing for the last several days and states that this feels fairly similar to prior UTIs.  Which is some pain over low back but denies any obvious gross hematuria.  No fever, chills or bodyaches. Physical exam is reassuring with no abdominal or flank tenderness.  There are some pain towards the left hip with palpation over bony prominences likely not due to kidney or renal complications.  With lack of blood present, doubtful of urolithiasis at this time suspect likely developing UTI. Given findings, will start patient on a course of antibiotics for symptomatic UTI and likely developing infection based on urine dipstick results.  Urine culture sent off for further testing.  Started patient on nitrofurantoin  twice daily for the next 5 days.  Return precautions discussed as well as ED precautions.  She is otherwise stable this time for outpatient follow-up and discharged  home.   Social Determinants of Health:  None  Final Clinical Impressions(s) / UC Diagnoses   Final diagnoses:  Acute cystitis without hematuria     Discharge Instructions      You are seen in urgent care today for concerns of UTI symptoms.  Your urine appears to show signs of a developing infection with white blood cells present as well as a cloudy appearance.  Your urine has been sent off for culture for further evaluation to ensure correct antibiotic selection.  If any antibiotics need to be modified, you will receive a call with instructions of changes that are being made.  For any concerns of worsening back pain, or development of fevers, please go to your nearest emergency department for further evaluation.     ED Prescriptions     Medication Sig Dispense Auth. Provider   nitrofurantoin , macrocrystal-monohydrate, (MACROBID ) 100 MG capsule Take 1 capsule (100 mg total) by mouth 2 (two) times daily for 5 days. 10 capsule Sanvika Cuttino A, PA-C      PDMP not reviewed this encounter.   Paisli Silfies A, PA-C 02/04/24 [phone]

## 2024-02-04 NOTE — Discharge Instructions (Signed)
 You are seen in urgent care today for concerns of UTI symptoms.  Your urine appears to show signs of a developing infection with white blood cells present as well as a cloudy appearance.  Your urine has been sent off for culture for further evaluation to ensure correct antibiotic selection.  If any antibiotics need to be modified, you will receive a call with instructions of changes that are being made.  For any concerns of worsening back pain, or development of fevers, please go to your nearest emergency department for further evaluation.

## 2024-02-05 ENCOUNTER — Encounter (HOSPITAL_BASED_OUTPATIENT_CLINIC_OR_DEPARTMENT_OTHER): Payer: Self-pay | Admitting: Physical Therapy

## 2024-02-05 ENCOUNTER — Ambulatory Visit (HOSPITAL_BASED_OUTPATIENT_CLINIC_OR_DEPARTMENT_OTHER): Admitting: Physical Therapy

## 2024-02-06 ENCOUNTER — Encounter (INDEPENDENT_AMBULATORY_CARE_PROVIDER_SITE_OTHER): Payer: Self-pay | Admitting: Family Medicine

## 2024-02-06 ENCOUNTER — Ambulatory Visit (INDEPENDENT_AMBULATORY_CARE_PROVIDER_SITE_OTHER): Admitting: Family Medicine

## 2024-02-06 DIAGNOSIS — G5793 Unspecified mononeuropathy of bilateral lower limbs: Secondary | ICD-10-CM | POA: Diagnosis not present

## 2024-02-06 DIAGNOSIS — G43109 Migraine with aura, not intractable, without status migrainosus: Secondary | ICD-10-CM

## 2024-02-06 DIAGNOSIS — Z6841 Body Mass Index (BMI) 40.0 and over, adult: Secondary | ICD-10-CM | POA: Diagnosis not present

## 2024-02-06 DIAGNOSIS — D508 Other iron deficiency anemias: Secondary | ICD-10-CM

## 2024-02-06 DIAGNOSIS — Z7985 Long-term (current) use of injectable non-insulin antidiabetic drugs: Secondary | ICD-10-CM | POA: Diagnosis not present

## 2024-02-06 DIAGNOSIS — E559 Vitamin D deficiency, unspecified: Secondary | ICD-10-CM | POA: Diagnosis not present

## 2024-02-06 DIAGNOSIS — K5909 Other constipation: Secondary | ICD-10-CM

## 2024-02-06 DIAGNOSIS — F39 Unspecified mood [affective] disorder: Secondary | ICD-10-CM | POA: Diagnosis not present

## 2024-02-06 DIAGNOSIS — E1169 Type 2 diabetes mellitus with other specified complication: Secondary | ICD-10-CM

## 2024-02-06 DIAGNOSIS — Z9884 Bariatric surgery status: Secondary | ICD-10-CM | POA: Diagnosis not present

## 2024-02-06 LAB — URINE CULTURE
Culture: >=100000 — AB
Special Requests: NORMAL

## 2024-02-06 MED ORDER — SEMAGLUTIDE (1 MG/DOSE) 4 MG/3ML ~~LOC~~ SOPN: 1.0000 mg | PEN_INJECTOR | SUBCUTANEOUS | 1 refills | Status: DC

## 2024-02-06 MED ORDER — TOPIRAMATE 25 MG PO TABS: 25.0000 mg | ORAL_TABLET | Freq: Two times a day (BID) | ORAL | 1 refills | Status: DC

## 2024-02-06 MED ORDER — VITAMIN D (ERGOCALCIFEROL) 1.25 MG (50000 UNIT) PO CAPS: ORAL_CAPSULE | ORAL | 0 refills | Status: DC

## 2024-02-06 MED ORDER — METFORMIN HCL 500 MG PO TABS: 500.0000 mg | ORAL_TABLET | Freq: Two times a day (BID) | ORAL | 1 refills | Status: DC

## 2024-02-06 MED ORDER — LINACLOTIDE 72 MCG PO CAPS: 72.0000 ug | ORAL_CAPSULE | Freq: Every day | ORAL | 0 refills | Status: DC

## 2024-02-06 MED ORDER — PNV PRENATAL PLUS MULTIVIT+DHA 27-1 & 312 MG PO MISC: ORAL | 0 refills | Status: AC

## 2024-02-06 NOTE — Progress Notes (Signed)
 Karen Brennan, D.O.  ABFM, ABOM Specializing in Clinical Bariatric Medicine  Office located at: [address] [place], [place]  [address]     Assessment and Plan:   Medications Discontinued During This Encounter  Medication Reason   metFORMIN  (GLUCOPHAGE ) 500 MG tablet Reorder   linaclotide  (LINZESS ) 72 MCG capsule Reorder   Vitamin D , Ergocalciferol , (DRISDOL ) 1.25 MG (50000 UNIT) CAPS capsule Reorder   Semaglutide , 1 MG/DOSE, 4 MG/3ML SOPN Reorder   Prenatal Vit-Fe Fum-FA-Omega (PNV PRENATAL PLUS MULTIVIT+DHA) 27-1 & 312 MG MISC Reorder    Meds ordered this encounter  Medications   topiramate  (TOPAMAX ) 25 MG tablet    Sig: Take 1 tablet (25 mg total) by mouth 2 (two) times daily.    Dispense:  60 tablet    Refill:  1   metFORMIN  (GLUCOPHAGE ) 500 MG tablet    Sig: Take 1 tablet (500 mg total) by mouth 2 (two) times daily with a meal.    Dispense:  60 tablet    Refill:  1   Semaglutide , 1 MG/DOSE, 4 MG/3ML SOPN    Sig: Inject 1 mg as directed once a week.    Dispense:  3 mL    Refill:  1   Vitamin D , Ergocalciferol , (DRISDOL ) 1.25 MG (50000 UNIT) CAPS capsule    Sig: 1 tab q wed and 1 tab q sun    Dispense:  24 capsule    Refill:  0    ** OV for RF **   Do not send RF request   Prenatal Vit-Fe Fum-FA-Omega (PNV PRENATAL PLUS MULTIVIT+DHA) 27-1 & 312 MG MISC    Sig: 1 po qd    Dispense:  90 each    Refill:  0   linaclotide  (LINZESS ) 72 MCG capsule    Sig: Take 1 capsule (72 mcg total) by mouth daily before breakfast.    Dispense:  90 capsule    Refill:  0      FOR THE DISEASE OF OBESITY:  Morbid obesity (HCC)-starting bmi 58.45/date 02/01/21 BMI 40.0-44.9, adult (HCC)- current BMI 44.21 Assessment & Plan: Since last office visit on 12/20/23 patient's muscle mass has decreased by 3.8 lbs. Fat mass has increased by 14.4 lbs. Total body water has decreased by 5.4 lbs.  Body fat % has increased by 2.7 %. Counseling done on how various foods will affect these  numbers and how to maximize success  Body fat %, fat mass, and visceral fat rating have all significantly increased in the the last 1-2 months. Emphasized importance of working on diet, exercise, and other lifestyle interventions in an effort to reduce risk worsening of comorbid conditions associated with poorly controlled obesity.   Total lbs lost to date: 108 lbs Total weight loss percentage to date: -32.73 %   Recommended Dietary Goals Latice is currently in the action stage of change. As such, her goal is to continue weight management plan.  She has agreed to: continue current plan   Behavioral Intervention We discussed the following today: increasing lean protein intake to established goals, decreasing simple carbohydrates , avoiding skipping meals, increasing water intake , work on meal planning and preparation, decreasing eating out or consumption of processed foods, and making healthy choices when eating convenient foods, work on managing stress, creating time for self-care and relaxation, planning for success, and better snacking choices  Additional resources provided today: None  Evidence-based interventions for health behavior change were utilized today including the discussion of self monitoring techniques,  problem-solving barriers and SMART goal setting techniques.   Regarding patient's less desirable eating habits and patterns, we employed the technique of small changes.   Pt will specifically work on: n/a   Recommended Physical Activity Goals Chancy has been advised to work up to 300-450 minutes of moderate intensity aerobic activity a week and strengthening exercises 2-3 times per week for cardiovascular health, weight loss maintenance and preservation of muscle mass.   She has agreed to: Continue current level of physical activity , Increase physical activity in their day and reduce sedentary time (increase NEAT)., and Continue to gradually increase the amount and intensity  of exercise routine   Pharmacotherapy Currently on Ozempic  1 mg once weekly. Did not tolerate dose of 2 mg Ozempic ; had belching, gas, nausea, and other GI sx. Her Ozempic  was then decreased back to 1 mg. Will continue Ozempic  at 1 mg dose; refilling today. Continue monitoring.    ASSOCIATED CONDITIONS ADDRESSED TODAY:  Mood disorder [facility]) Assessment & Plan: Patient is established with psychiatry. Currently on Buspar 7.5 mg BID, Prozac 40 mg once daily, Haloperidol 2 mg daily, and Hydroxyzine  25 mg once daily PRN. Good compliance/tolerance. No adverse side effects or acute concerns reported today. She endorses increased stress recently.   Counseled pt on obesogenic nature of medications making weight loss more challenging. Advised patient to discuss this with her psychiatrist and consider additional medication options. Continue current medication regimen until able to discuss options with psychiatry. Reminded pt to practice stress management strategies. Continue monitoring condition as released to her weight loss journey.    Type 2 diabetes mellitus with obesity [facility]) Assessment & Plan: Lab Results  Component Value Date   HGBA1C 5.3 12/28/2023   HGBA1C 5.5 08/08/2023   HGBA1C 5.5 04/11/2023   INSULIN  11.0 01/12/2022   INSULIN  22.7 02/01/2021    Currently on Metformin  500 mg BID. Tolerating well overall. Denies any G.I. upset or adverse side effects. Hunger/cravings are uncontrolled, possibly due to reported skipped doses of Metformin . Of note, patient was taking Ozempic  in the past and experienced belching, gas, nausea, and other GI symptoms when increased to 2 mg dose. As a result, her Ozempic  was decreased to 1 mg at the time.  Will consider increasing Metformin  to help with hunger/cravings and continued weight loss, given she she is unable to tolerate increasing dose to Ozempic  2 mg once weekly. Mutually agreed to hold off on increasing Metformin . Will refill Metformin  today, no dose  changes. Continue best efforts with following nutritional meal plan, regular exercise, and proper hydration. Work on increasing protein/whole food intake and decreasing simple carbs and sugars.     Migraine with aura and without status migrainosus, not intractable Assessment & Plan: Has about 8 episodes/month without aura. Endorses associated photophobia. Previously on Topiramate  in 2012. She also reports her hunger and cravings are uncontrolled.   Continue efforts with healthy diet, regular exercise, and proper hydration. Shared decision making: will restart Topiramate  50 mg BID for better support with hunger and cravings. Start at 1/2 tab once daily and increase to 1/2 tab BID if well tolerated well after 4 days then Increase to 1 tab BID. Will continue monitoring condition.     Vitamin D  deficiency Assessment & Plan: Lab Results  Component Value Date   VD25OH 52.0 10/25/2023   VD25OH 42.2 08/08/2023   VD25OH 34.9 04/11/2023   Relevant treatment: ERGO 50K units 2x/wkly. Admits to some skipped doses in the past. Reports better compliance more recently. Overall, she  has tolerated supplementation well with no adverse SE reported. No acute concerns in this regard.   Continue with supplementation as prescribed. Will refill ERGO at current dose. Reviewed how vit D relates to hunger/craving pathways. Will continue monitoring condition.     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 Currently taking iron supplementation. Tolerating well with no SE reported. Recommend she also start on prenatal iron supplement. Reviewed how iron deficiency anemia may be secondary to prior gastric bypass. Encouraged iron-rich diet as tolerated. Encouraged adherence to bariatric vitamins. Supplement iron with prenatal pills. Will continue  monitoring condition and recheck labs periodically.     Other constipation Assessment & Plan: Has been managing condition with Linzess . Pt requested refill of medication. Continue on current med regimen as prescribed. Will refill Linzess  today, no dose changes. Stay properly hydrated; drinking half her body weight in ounces of water per day.     Follow up:   Return for f/u on 03/05/2024 at 12:00 PM. She was informed of the importance of frequent follow up visits to maximize her success with intensive lifestyle modifications for her multiple health conditions.  Subjective:   Chief complaint: Obesity Yaritzel is here to discuss her progress with her obesity treatment plan. She is on the Category 3 Plan and states she is following her eating plan approximately 50 % of the time. She states she is walking 15 minutes 2 days per week.    Interval History:  TRACYE SZUCH is here for a follow up office visit. Since last OV on 12/20/23, she is up 10 lbs. She endorses increased hunger and cravings. Pt complains of a tooth ache and states her dentist would not be able to perform a tooth extraction until next month. This has effected her mood, eating habits, and made it difficult for her to focus on daily tasks due to the daily pain. She has been taking Tylenol  and Orajel.    Pharmacotherapy that aid with weight loss: She is currently taking Metformin  500 mg BID and Ozempic  1 mg once weekly.     Review of Systems:  Pertinent positives were addressed with patient today.  Reviewed by clinician on day of visit: allergies, medications, problem list, medical history, surgical history, family history, social history, and previous encounter notes.  Weight Summary and Biometrics   Weight Lost Since Last Visit: 0lb  Weight Gained Since Last Visit: 10lb    Vitals Temp: 98.3 F (36.8 C) BP: 124/79 Pulse Rate: 81 SpO2: 97 %   Anthropometric Measurements Height: 6' 1 (1.854 m) Weight: (!) 335 lb  (152 kg) BMI (Calculated): 44.21 Weight at Last Visit: 325lb Weight Lost Since Last Visit: 0lb Weight Gained Since Last Visit: 10lb Starting Weight: 443lb Total Weight Loss (lbs): 108 lb (49 kg)   Body Composition  Body Fat %: 54.1 % Fat Mass (lbs): 181.6 lbs Muscle Mass (lbs): 146.2 lbs Total Body Water (lbs): 107.6 lbs Visceral Fat Rating : 17   Other Clinical Data Fasting: no Labs: no Today's Visit #: 42 Starting Date: 02/01/21    Objective:   PHYSICAL EXAM: Blood pressure 124/79, pulse 81, temperature 98.3 F (36.8 C), height 6' 1 (1.854 m), weight (!) 335 lb (152 kg), SpO2 97%. Body mass index is 44.2 kg/m.  General: she is overweight, cooperative and in no acute  distress. PSYCH: Has normal mood, affect and thought process.   HEENT: EOMI, sclerae are anicteric. Lungs: Normal breathing effort, no conversational dyspnea. Extremities: Moves * 4 Neurologic: A and O * 3, good insight  DIAGNOSTIC DATA REVIEWED: BMET    Component Value Date/Time   NA 142 10/25/2023 1413   K 3.9 10/25/2023 1413   CL 104 10/25/2023 1413   CO2 24 10/25/2023 1413   GLUCOSE 89 10/25/2023 1413   GLUCOSE 208 (H) 07/04/2022 1037   BUN 13 10/25/2023 1413   CREATININE 0.66 10/25/2023 1413   CREATININE 0.82 07/05/2016 0942   CALCIUM  8.9 10/25/2023 1413   GFRNONAA >60 07/04/2022 1037   GFRNONAA >89 07/05/2016 0942   GFRAA 99 02/12/2020 1423   GFRAA >89 07/05/2016 0942   Lab Results  Component Value Date   HGBA1C 5.3 12/28/2023   HGBA1C 5.7 09/15/2011   Lab Results  Component Value Date   INSULIN  11.0 01/12/2022   INSULIN  22.7 02/01/2021   Lab Results  Component Value Date   TSH 1.150 04/11/2023   CBC    Component Value Date/Time   WBC 8.7 07/24/2023 1112   WBC 10.4 07/04/2022 1037   RBC 4.54 07/24/2023 1112   RBC 4.76 07/04/2022 1037   HGB 13.3 07/24/2023 1112   HCT 40.6 07/24/2023 1112   PLT 357 07/24/2023 1112   MCV 89 07/24/2023 1112   MCH 29.3 07/24/2023 1112    MCH 29.6 07/04/2022 1037   MCHC 32.8 07/24/2023 1112   MCHC 32.5 07/04/2022 1037   RDW 11.8 07/24/2023 1112   Iron Studies    Component Value Date/Time   IRON 55 10/25/2023 1413   TIBC 407 08/08/2023 1029   FERRITIN 52 08/08/2023 1029   IRONPCTSAT 25 08/08/2023 1029   Lipid Panel     Component Value Date/Time   CHOL 126 02/16/2023 1605   TRIG 62 02/16/2023 1605   HDL 57 02/16/2023 1605   CHOLHDL 2.2 02/16/2023 1605   CHOLHDL 3.8 12/09/2014 1102   VLDL 25 12/09/2014 1102   LDLCALC 56 02/16/2023 1605   Hepatic Function Panel     Component Value Date/Time   PROT 6.3 04/11/2023 1050   ALBUMIN 4.1 04/11/2023 1050   AST 20 04/11/2023 1050   ALT 16 04/11/2023 1050   ALKPHOS 56 04/11/2023 1050   BILITOT 1.1 04/11/2023 1050   BILIDIR 0.1 02/02/2009 2254   IBILI 0.6 02/02/2009 2254      Component Value Date/Time   TSH 1.150 04/11/2023 1050   Nutritional Lab Results  Component Value Date   VD25OH 52.0 10/25/2023   VD25OH 42.2 08/08/2023   VD25OH 34.9 04/11/2023    Attestations:   I, Vernell Forest, acting as a Stage manager for Karen Jenkins, DO., have compiled all relevant documentation for today's office visit on behalf of Karen Jenkins, DO, while in the presence of [employer], DO.  I have reviewed the above documentation for accuracy and completeness, and I agree with the above. Karen JINNY Brennan, D.O.  The 21st Century Cures Act was signed into law in 2016 which includes the topic of electronic health records.  This provides immediate access to information in MyChart.  This includes consultation notes, operative notes, office notes, lab results and pathology reports.  If you have any questions about what you read please let us  know at your next visit so we can discuss your concerns and take corrective action if need be.  We are right here with you.

## 2024-02-07 ENCOUNTER — Ambulatory Visit (HOSPITAL_BASED_OUTPATIENT_CLINIC_OR_DEPARTMENT_OTHER): Admitting: Physical Therapy

## 2024-02-08 ENCOUNTER — Ambulatory Visit (HOSPITAL_COMMUNITY): Payer: Self-pay

## 2024-02-08 DIAGNOSIS — G5793 Unspecified mononeuropathy of bilateral lower limbs: Secondary | ICD-10-CM | POA: Diagnosis not present

## 2024-02-11 DIAGNOSIS — G5793 Unspecified mononeuropathy of bilateral lower limbs: Secondary | ICD-10-CM | POA: Diagnosis not present

## 2024-02-11 NOTE — Progress Notes (Incomplete)
 Karen Brennan, D.O.  ABFM, ABOM Specializing in Clinical Bariatric Medicine  Office located at: [address] [place], [place]  [address]     Assessment and Plan:   Medications Discontinued During This Encounter  Medication Reason  . metFORMIN  (GLUCOPHAGE ) 500 MG tablet Reorder  . linaclotide  (LINZESS ) 72 MCG capsule Reorder  . Vitamin D , Ergocalciferol , (DRISDOL ) 1.25 MG (50000 UNIT) CAPS capsule Reorder  . Semaglutide , 1 MG/DOSE, 4 MG/3ML SOPN Reorder  . Prenatal Vit-Fe Fum-FA-Omega (PNV PRENATAL PLUS MULTIVIT+DHA) 27-1 & 312 MG MISC Reorder    Meds ordered this encounter  Medications  . topiramate  (TOPAMAX ) 25 MG tablet    Sig: Take 1 tablet (25 mg total) by mouth 2 (two) times daily.    Dispense:  60 tablet    Refill:  1  . metFORMIN  (GLUCOPHAGE ) 500 MG tablet    Sig: Take 1 tablet (500 mg total) by mouth 2 (two) times daily with a meal.    Dispense:  60 tablet    Refill:  1  . Semaglutide , 1 MG/DOSE, 4 MG/3ML SOPN    Sig: Inject 1 mg as directed once a week.    Dispense:  3 mL    Refill:  1  . Vitamin D , Ergocalciferol , (DRISDOL ) 1.25 MG (50000 UNIT) CAPS capsule    Sig: 1 tab q wed and 1 tab q sun    Dispense:  24 capsule    Refill:  0    ** OV for RF **   Do not send RF request  . Prenatal Vit-Fe Fum-FA-Omega (PNV PRENATAL PLUS MULTIVIT+DHA) 27-1 & 312 MG MISC    Sig: 1 po qd    Dispense:  90 each    Refill:  0  . linaclotide  (LINZESS ) 72 MCG capsule    Sig: Take 1 capsule (72 mcg total) by mouth daily before breakfast.    Dispense:  90 capsule    Refill:  0      FOR THE DISEASE OF OBESITY:  Morbid obesity (HCC)-starting bmi 58.45/date 02/01/21 BMI 40.0-44.9, adult (HCC)- current BMI 44.21 Assessment & Plan: Since last office visit on 12/20/23 patient's muscle mass has decreased by 3.8 lbs. Fat mass has increased by 14.4 lbs. Total body water has decreased by 5.4 lbs.  Body fat % has increased by 2.7 %. Counseling done on how various foods will  affect these numbers and how to maximize success  Total lbs lost to date: 108 lbs Total weight loss percentage to date: -32.73 %   Recommended Dietary Goals Shylee is currently in the action stage of change. As such, her goal is to continue weight management plan.  She has agreed to: continue current plan   Behavioral Intervention We discussed the following today: {[email or web address]:[id number]}  Additional resources provided today: None  Evidence-based interventions for health behavior change were utilized today including the discussion of self monitoring techniques, problem-solving barriers and SMART goal setting techniques.   Regarding patient's less desirable eating habits and patterns, we employed the technique of small changes.   Pt will specifically work on: ***    Recommended Physical Activity Goals Kaira has been advised to work up to 300-450 minutes of moderate intensity aerobic activity a week and strengthening exercises 2-3 times per week for cardiovascular health, weight loss maintenance and preservation of muscle mass.   She has agreed to: Continue current level of physical activity , Increase physical activity in their day and reduce sedentary time (increase NEAT)., and Continue to gradually  increase the amount and intensity of exercise routine   Pharmacotherapy We both agreed to: Continue with current nutritional and behavioral strategies   ASSOCIATED CONDITIONS ADDRESSED TODAY:  Mood disorder (HCC) Assessment & Plan:  Pt is seeing pscyhatri hx of bipolar  Again obesogenic natur of meds  Pt forget to talk to hre pscyh  - provided pt with a list about what can help       ***   Type 2 diabetes mellitus with obesity (HCC) Assessment & Plan:  Reports skipped doses of Metformin    Will consider adding METF to SEMG reg to help with wt loss since she cannot tolerate an increase in SEMG   Going up on Ozempic  2 mg caused SE - Belching, gas, nausea,  etc    Will refill Metformin  today, no dose changes.    ***   Migraine with aura and without status migrainosus, not intractable Assessment & Plan:  Cravings and wanting to eat something May have happened bc she was skipping meals.  - was on Topirimate in the past - 01/2011   Hx of Migraines are well controlled ***  Has episodes 8x/month without aura -photophobia  Used topiramte in remote past  Will try Topiramate  to help with hunger/cravings.  Start at 1/2 tabs once daily and advance as tolerated every couple days.  ***   - Topirimate   ***   Vitamin D  deficiency Assessment & Plan:  Relevant treatment: ERGO 50K units 2x/wkly. Admits to some skipped doses in the past.     Low vit D at times is linked to increased hunger/cravings.   Will refill ERGO at current dose.   ***   Other iron deficiency anemia S/P gastric bypass Assessment & Plan: Lab Results  Component Value Date   IRON 55 10/25/2023   TIBC 407 08/08/2023   FERRITIN 52 08/08/2023   Pt reports a personal hx of gastric sleeve.     Start on prenatal iron supplement.    ***   Reports GI upset when eating off-plan     ***    Other constipation Assessment & Plan: Has been managing condition with Linzess . Pt requested refill of medication. Continue on current med regimen as prescribed. Will refill Linzess  today, no dose changes. Stay properly hydrated; drinking half her body weight in ounces of water per day.     Follow up:   Return for f/u on 03/05/2024 at 12:00 PM. She was informed of the importance of frequent follow up visits to maximize her success with intensive lifestyle modifications for her multiple health conditions.   Subjective:   Chief complaint: Obesity Braden is here to discuss her progress with her obesity treatment plan. She is on the Category 3 Plan and states she is following her eating plan approximately 50 % of the time. She states she is walking 15 minutes 2  days per week.    Interval History:  ADALAE BAYSINGER is here for a follow up office visit. Since last OV on 12/20/23, she is up 10 lbs. She has not been     She endorses increased hunger and cravings.     Pt complains of a tooth ache and states her dentist would not be able to perform a tooth extraction until next month. This has effected her mood, eating habits, and made it difficult for her to focus on daily tasks due to the daily pain. She has been taking Tylenol  and Orajel.     Increased hunger.  Pharmacotherapy that aid with weight loss: She is currently taking Metformin  500 mg BID and Ozempic  1 mg once weekly.     Review of Systems:  Pertinent positives were addressed with patient today.  Reviewed by clinician on day of visit: allergies, medications, problem list, medical history, surgical history, family history, social history, and previous encounter notes.  Weight Summary and Biometrics   Weight Lost Since Last Visit: 0lb  Weight Gained Since Last Visit: 10lb    Vitals Temp: 98.3 F (36.8 C) BP: 124/79 Pulse Rate: 81 SpO2: 97 %   Anthropometric Measurements Height: 6' 1 (1.854 m) Weight: (!) 335 lb (152 kg) BMI (Calculated): 44.21 Weight at Last Visit: 325lb Weight Lost Since Last Visit: 0lb Weight Gained Since Last Visit: 10lb Starting Weight: 443lb Total Weight Loss (lbs): 108 lb (49 kg)   Body Composition  Body Fat %: 54.1 % Fat Mass (lbs): 181.6 lbs Muscle Mass (lbs): 146.2 lbs Total Body Water (lbs): 107.6 lbs Visceral Fat Rating : 17   Other Clinical Data Fasting: no Labs: no Today's Visit #: 42 Starting Date: 02/01/21    Objective:   PHYSICAL EXAM: Blood pressure 124/79, pulse 81, temperature 98.3 F (36.8 C), height 6' 1 (1.854 m), weight (!) 335 lb (152 kg), SpO2 97%. Body mass index is 44.2 kg/m.  General: she is overweight, cooperative and in no acute distress. PSYCH: Has normal mood, affect and thought process.    HEENT: EOMI, sclerae are anicteric. Lungs: Normal breathing effort, no conversational dyspnea. Extremities: Moves * 4 Neurologic: A and O * 3, good insight  DIAGNOSTIC DATA REVIEWED: BMET    Component Value Date/Time   NA 142 10/25/2023 1413   K 3.9 10/25/2023 1413   CL 104 10/25/2023 1413   CO2 24 10/25/2023 1413   GLUCOSE 89 10/25/2023 1413   GLUCOSE 208 (H) 07/04/2022 1037   BUN 13 10/25/2023 1413   CREATININE 0.66 10/25/2023 1413   CREATININE 0.82 07/05/2016 0942   CALCIUM  8.9 10/25/2023 1413   GFRNONAA >60 07/04/2022 1037   GFRNONAA >89 07/05/2016 0942   GFRAA 99 02/12/2020 1423   GFRAA >89 07/05/2016 0942   Lab Results  Component Value Date   HGBA1C 5.3 12/28/2023   HGBA1C 5.7 09/15/2011   Lab Results  Component Value Date   INSULIN  11.0 01/12/2022   INSULIN  22.7 02/01/2021   Lab Results  Component Value Date   TSH 1.150 04/11/2023   CBC    Component Value Date/Time   WBC 8.7 07/24/2023 1112   WBC 10.4 07/04/2022 1037   RBC 4.54 07/24/2023 1112   RBC 4.76 07/04/2022 1037   HGB 13.3 07/24/2023 1112   HCT 40.6 07/24/2023 1112   PLT 357 07/24/2023 1112   MCV 89 07/24/2023 1112   MCH 29.3 07/24/2023 1112   MCH 29.6 07/04/2022 1037   MCHC 32.8 07/24/2023 1112   MCHC 32.5 07/04/2022 1037   RDW 11.8 07/24/2023 1112   Iron Studies    Component Value Date/Time   IRON 55 10/25/2023 1413   TIBC 407 08/08/2023 1029   FERRITIN 52 08/08/2023 1029   IRONPCTSAT 25 08/08/2023 1029   Lipid Panel     Component Value Date/Time   CHOL 126 02/16/2023 1605   TRIG 62 02/16/2023 1605   HDL 57 02/16/2023 1605   CHOLHDL 2.2 02/16/2023 1605   CHOLHDL 3.8 12/09/2014 1102   VLDL 25 12/09/2014 1102   LDLCALC 56 02/16/2023 1605   Hepatic Function Panel  Component Value Date/Time   PROT 6.3 04/11/2023 1050   ALBUMIN 4.1 04/11/2023 1050   AST 20 04/11/2023 1050   ALT 16 04/11/2023 1050   ALKPHOS 56 04/11/2023 1050   BILITOT 1.1 04/11/2023 1050   BILIDIR 0.1  02/02/2009 2254   IBILI 0.6 02/02/2009 2254      Component Value Date/Time   TSH 1.150 04/11/2023 1050   Nutritional Lab Results  Component Value Date   VD25OH 52.0 10/25/2023   VD25OH 42.2 08/08/2023   VD25OH 34.9 04/11/2023    Attestations:   I, Vernell Forest, acting as a medical scribe for Karen Jenkins, DO., have compiled all relevant documentation for today's office visit on behalf of Karen Jenkins, DO, while in the presence of [employer], DO.  I have reviewed the above documentation for accuracy and completeness, and I agree with the above. Karen Brennan, D.O.  The 21st Century Cures Act was signed into law in 2016 which includes the topic of electronic health records.  This provides immediate access to information in MyChart.  This includes consultation notes, operative notes, office notes, lab results and pathology reports.  If you have any questions about what you read please let us  know at your next visit so we can discuss your concerns and take corrective action if need be.  We are right here with you.

## 2024-02-12 ENCOUNTER — Encounter: Payer: Self-pay | Admitting: Podiatry

## 2024-02-12 ENCOUNTER — Ambulatory Visit: Admitting: Podiatry

## 2024-02-12 ENCOUNTER — Encounter (HOSPITAL_BASED_OUTPATIENT_CLINIC_OR_DEPARTMENT_OTHER): Admitting: Physical Therapy

## 2024-02-12 VITALS — Ht 73.0 in | Wt 335.0 lb

## 2024-02-12 DIAGNOSIS — D492 Neoplasm of unspecified behavior of bone, soft tissue, and skin: Secondary | ICD-10-CM | POA: Diagnosis not present

## 2024-02-12 NOTE — Progress Notes (Signed)
 Presents today follow-up plantar verruca hallux left.  Says she is doing much better not no discomfort.   Physical exam:  General appearance: Pleasant, and in no acute distress. AOx3.  Vascular: Pedal pulses: DP 2/4 bilaterally, PT 2/4 bilaterally.  Mild to moderate edema lower legs bilaterally. Capillary fill time needed bilaterally.  Neurological: Grossly intact bilaterally  Dermatologic:   Skin normal temperature bilaterally.  Skin normal color, tone, and texture bilaterally.   Musculoskeletal: Hallux valgus deformity left    Diagnosis: 1.  Plantar verruca hallux left-resolved  Plan: -Established office visit for evaluation and management.  Level 2 - Verrucous lesion appears resolved.  She is to keep on the area for any signs of recurrence or development of verrucous lesions elsewhere.  If she has any problems she should call us .   Return as needed

## 2024-02-13 ENCOUNTER — Telehealth: Payer: Self-pay | Admitting: *Deleted

## 2024-02-13 DIAGNOSIS — F3160 Bipolar disorder, current episode mixed, unspecified: Secondary | ICD-10-CM

## 2024-02-13 DIAGNOSIS — E1141 Type 2 diabetes mellitus with diabetic mononeuropathy: Secondary | ICD-10-CM

## 2024-02-13 DIAGNOSIS — E1169 Type 2 diabetes mellitus with other specified complication: Secondary | ICD-10-CM

## 2024-02-13 DIAGNOSIS — G5793 Unspecified mononeuropathy of bilateral lower limbs: Secondary | ICD-10-CM | POA: Diagnosis not present

## 2024-02-13 NOTE — Progress Notes (Signed)
 Complex Care Management Note  Care Guide Note 02/13/2024 Name: TEASHA MURRILLO MRN: [id number] DOB: May 18, 1976  [name] is a 48 y.o. year old female who sees Hindel, Rea, MD for primary care. I reached out to [name] by phone today to offer complex care management services.  Ms. Oates was given information about Complex Care Management services today including:   The Complex Care Management services include support from the care team which includes your Nurse Care Manager, Clinical Social Worker, or Pharmacist.  The Complex Care Management team is here to help remove barriers to the health concerns and goals most important to you. Complex Care Management services are voluntary, and the patient may decline or stop services at any time by request to their care team member.   Complex Care Management Consent Status: Patient agreed to services and verbal consent obtained.   Follow up plan:  Telephone appointment with complex care management team member scheduled for:  02/18/24  Encounter Outcome:  Patient Scheduled  Harlene Satterfield  [facility]  [facility] Guide  Direct Dial: [phone]  Fax [phone]

## 2024-02-14 ENCOUNTER — Other Ambulatory Visit: Payer: Self-pay | Admitting: Family Medicine

## 2024-02-15 ENCOUNTER — Ambulatory Visit (HOSPITAL_BASED_OUTPATIENT_CLINIC_OR_DEPARTMENT_OTHER): Admitting: Physical Therapy

## 2024-02-15 DIAGNOSIS — G5793 Unspecified mononeuropathy of bilateral lower limbs: Secondary | ICD-10-CM | POA: Diagnosis not present

## 2024-02-18 ENCOUNTER — Other Ambulatory Visit: Payer: Self-pay | Admitting: *Deleted

## 2024-02-18 DIAGNOSIS — G5793 Unspecified mononeuropathy of bilateral lower limbs: Secondary | ICD-10-CM | POA: Diagnosis not present

## 2024-02-18 NOTE — Patient Instructions (Signed)
 [name] - I am sorry I was unable to reach you today for our scheduled appointment. I work with Adele Song, MD and am calling to support your healthcare needs. Please contact me at [phone] at your earliest convenience. I look forward to speaking with you soon.   Thank you,  Ronica Vivian, RN, BSN, ACM RN Care Manager [employer] [phone]

## 2024-02-20 ENCOUNTER — Ambulatory Visit (INDEPENDENT_AMBULATORY_CARE_PROVIDER_SITE_OTHER): Admitting: Family Medicine

## 2024-02-20 ENCOUNTER — Encounter: Payer: Self-pay | Admitting: Family Medicine

## 2024-02-20 ENCOUNTER — Other Ambulatory Visit (HOSPITAL_COMMUNITY)
Admission: RE | Admit: 2024-02-20 | Discharge: 2024-02-20 | Disposition: A | Discharge status: Home / Self Care | Source: Ambulatory Visit | Attending: Family Medicine | Admitting: Family Medicine

## 2024-02-20 VITALS — BP 131/87 | HR 96 | Ht 73.0 in | Wt 340.6 lb

## 2024-02-20 DIAGNOSIS — R87613 High grade squamous intraepithelial lesion on cytologic smear of cervix (HGSIL): Secondary | ICD-10-CM

## 2024-02-20 DIAGNOSIS — R42 Dizziness and giddiness: Secondary | ICD-10-CM | POA: Diagnosis not present

## 2024-02-20 DIAGNOSIS — Z23 Encounter for immunization: Secondary | ICD-10-CM | POA: Diagnosis not present

## 2024-02-20 DIAGNOSIS — F411 Generalized anxiety disorder: Secondary | ICD-10-CM | POA: Diagnosis not present

## 2024-02-20 DIAGNOSIS — G5793 Unspecified mononeuropathy of bilateral lower limbs: Secondary | ICD-10-CM | POA: Diagnosis not present

## 2024-02-20 DIAGNOSIS — Z124 Encounter for screening for malignant neoplasm of cervix: Secondary | ICD-10-CM | POA: Insufficient documentation

## 2024-02-20 DIAGNOSIS — E669 Obesity, unspecified: Secondary | ICD-10-CM

## 2024-02-20 DIAGNOSIS — F250 Schizoaffective disorder, bipolar type: Secondary | ICD-10-CM | POA: Diagnosis not present

## 2024-02-20 DIAGNOSIS — F4310 Post-traumatic stress disorder, unspecified: Secondary | ICD-10-CM | POA: Diagnosis not present

## 2024-02-20 DIAGNOSIS — E1169 Type 2 diabetes mellitus with other specified complication: Secondary | ICD-10-CM | POA: Diagnosis not present

## 2024-02-20 DIAGNOSIS — J309 Allergic rhinitis, unspecified: Secondary | ICD-10-CM | POA: Diagnosis not present

## 2024-02-20 DIAGNOSIS — F5101 Primary insomnia: Secondary | ICD-10-CM | POA: Diagnosis not present

## 2024-02-20 MED ORDER — FLUTICASONE PROPIONATE 50 MCG/ACT NA SUSP: 2.0000 | Freq: Every day | NASAL | 0 refills | Status: AC

## 2024-02-20 MED ORDER — COVID-19 MRNA VAC-TRIS(PFIZER) 30 MCG/0.3ML IM SUSY: 0.3000 mL | PREFILLED_SYRINGE | Freq: Once | INTRAMUSCULAR | 0 refills | Status: DC

## 2024-02-20 MED ORDER — COVID-19 MRNA VAC-TRIS(PFIZER) 30 MCG/0.3ML IM SUSY: 0.3000 mL | PREFILLED_SYRINGE | Freq: Once | INTRAMUSCULAR | 0 refills | Status: AC

## 2024-02-20 NOTE — Progress Notes (Signed)
    SUBJECTIVE:   CHIEF COMPLAINT / HPI:   HTN --stopped taking hydrochlorothiazide  in May, was having low pressures --still taking valsartan  40mg  daily -- Blood pressure has been well-controlled and patient not having as much dizziness or lightheadedness  When reviewing medication list, states she would like meclizine  refilled.  States she is still having some dizziness, meclizine  helped in the past  Due for pap today  Wants flu shot today  PERTINENT  PMH / PSH: Obesity, type 2 diabetes, HTN, bipolar 1, HGSIL  OBJECTIVE:   BP 131/87   Pulse 96   Ht 6' 1 (1.854 m)   Wt (!) 340 lb 9.6 oz (154.5 kg)   LMP  (LMP Unknown)   SpO2 100%   BMI 44.94 kg/m   General: Awake and conversant, no acute distress Respiratory: Normal work of breathing on room air GU: Retail banker, CMA present) normal external female genitalia, normal-appearing vaginal rugae, scant thick white discharge from cervical os.  No lesions or abnormalities seen Neuro: No focal deficits Psych: Appropriate mood and affect  ASSESSMENT/PLAN:   Assessment & Plan Type 2 diabetes mellitus with obesity (HCC) Last A1c 1 month ago was 5.3.  No changes in medications recently.  Follows with healthy weight and wellness for weight management.  Due for [facility] today.  Also qualifies for COVID-19 vaccine, provided prescription for this as we do not have the vaccines in clinic yet - Microalbumin / creatinine urine ratio - COVID-19 mRNA vaccine, Pfizer, (COMIRNATY) syringe; Inject 0.3 mLs into the muscle once for 1 dose.  Dispense: 0.3 mL; Refill: 0 Allergic rhinitis, unspecified seasonality, unspecified trigger Requested refill of Flonase  Cervical cancer screening Pap completed today.  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  Will follow-up with results Dizziness Reports dizziness today, states meclizine  helped with this in the past.  Advised follow-up appointment to discuss this further.  Scheduled for  next Monday Encounter for immunization Flu shot administered today     Rea Raring, MD [facility]

## 2024-02-20 NOTE — Patient Instructions (Addendum)
 Thank you for coming in today! Here is a summary of what we discussed:  -We should have the COVID vaccines soon, if you end up getting it somewhere else please bring documentation  -Please schedule an appt with your podiatrist for a diabetic foot exam  We are checking some labs today. If they are abnormal, I will call you. If they are normal, I will send you a MyChart message (if it is active) or a letter in the mail. If you do not hear about your labs in the next 2 weeks, please call the office.   Please call the clinic at [phone] if your symptoms worsen or you have any concerns.  Best, Dr Adele

## 2024-02-20 NOTE — Assessment & Plan Note (Signed)
 Requested refill of Flonase 

## 2024-02-21 LAB — MICROALBUMIN / CREATININE URINE RATIO
Creatinine, Urine: 182.9 mg/dL
Microalb/Creat Ratio: 3 mg/g{creat} (ref 0–29)
Microalbumin, Urine: 6.4 ug/mL

## 2024-02-22 ENCOUNTER — Telehealth: Payer: Self-pay

## 2024-02-22 ENCOUNTER — Ambulatory Visit: Payer: Self-pay | Admitting: Family Medicine

## 2024-02-22 NOTE — Telephone Encounter (Signed)
 Patient calls nurse line reporting she missed a phone call this morning.   She is requesting lab results from 9/10 visit.   Advised Pap is not back yet.   Will forward to PCP.

## 2024-02-25 ENCOUNTER — Ambulatory Visit: Payer: Self-pay | Admitting: Family Medicine

## 2024-02-25 DIAGNOSIS — G5793 Unspecified mononeuropathy of bilateral lower limbs: Secondary | ICD-10-CM | POA: Diagnosis not present

## 2024-02-25 LAB — CYTOLOGY - PAP
Adequacy: ABSENT
Comment: NEGATIVE
Diagnosis: NEGATIVE
High risk HPV: NEGATIVE

## 2024-02-27 DIAGNOSIS — G5793 Unspecified mononeuropathy of bilateral lower limbs: Secondary | ICD-10-CM | POA: Diagnosis not present

## 2024-02-28 ENCOUNTER — Telehealth: Payer: Self-pay

## 2024-02-28 NOTE — Progress Notes (Signed)
 Complex Care Management Care Guide Note  02/28/2024 Name: LAURENASHLEY VIAR MRN: [id number] DOB: 1975-10-24  NATHA GUIN is a 48 y.o. year old female who is a primary care patient of Hindel, Rea, MD and is actively engaged with the care management team. I reached out to [name] by phone today to assist with re-scheduling  with the RN Case Manager.  Follow up plan: Telephone appointment with complex care management team member scheduled for:  03/03/24 at 11:30 a.m.   Dream[facility]  [facility] VBCI Assistant Direct Dial: [phone]  Fax: [phone]

## 2024-02-29 DIAGNOSIS — G5793 Unspecified mononeuropathy of bilateral lower limbs: Secondary | ICD-10-CM | POA: Diagnosis not present

## 2024-03-03 ENCOUNTER — Other Ambulatory Visit: Payer: Self-pay | Admitting: *Deleted

## 2024-03-03 DIAGNOSIS — G5793 Unspecified mononeuropathy of bilateral lower limbs: Secondary | ICD-10-CM | POA: Diagnosis not present

## 2024-03-03 NOTE — Patient Instructions (Signed)
 Karen Brennan Fell - I am sorry I was unable to reach you today for our scheduled appointment. I work with Adele Song, MD and am calling to support your healthcare needs. Please contact me at [phone] at your earliest convenience. I look forward to speaking with you soon.   Thank you,  Ronica Vivian, RN, BSN, ACM RN Care Manager [employer] [phone]

## 2024-03-05 ENCOUNTER — Ambulatory Visit (INDEPENDENT_AMBULATORY_CARE_PROVIDER_SITE_OTHER): Admitting: Family Medicine

## 2024-03-05 ENCOUNTER — Encounter (INDEPENDENT_AMBULATORY_CARE_PROVIDER_SITE_OTHER): Payer: Self-pay | Admitting: Family Medicine

## 2024-03-05 VITALS — BP 126/74 | HR 82 | Temp 98.0°F | Ht 73.0 in | Wt 338.0 lb

## 2024-03-05 DIAGNOSIS — E1169 Type 2 diabetes mellitus with other specified complication: Secondary | ICD-10-CM | POA: Diagnosis not present

## 2024-03-05 DIAGNOSIS — Z6841 Body Mass Index (BMI) 40.0 and over, adult: Secondary | ICD-10-CM

## 2024-03-05 DIAGNOSIS — Z7985 Long-term (current) use of injectable non-insulin antidiabetic drugs: Secondary | ICD-10-CM

## 2024-03-05 DIAGNOSIS — Z7984 Long term (current) use of oral hypoglycemic drugs: Secondary | ICD-10-CM | POA: Diagnosis not present

## 2024-03-05 DIAGNOSIS — E669 Obesity, unspecified: Secondary | ICD-10-CM

## 2024-03-05 DIAGNOSIS — G5793 Unspecified mononeuropathy of bilateral lower limbs: Secondary | ICD-10-CM | POA: Diagnosis not present

## 2024-03-05 DIAGNOSIS — K5909 Other constipation: Secondary | ICD-10-CM | POA: Diagnosis not present

## 2024-03-05 DIAGNOSIS — F3160 Bipolar disorder, current episode mixed, unspecified: Secondary | ICD-10-CM | POA: Diagnosis not present

## 2024-03-05 DIAGNOSIS — E559 Vitamin D deficiency, unspecified: Secondary | ICD-10-CM | POA: Diagnosis not present

## 2024-03-05 DIAGNOSIS — G43109 Migraine with aura, not intractable, without status migrainosus: Secondary | ICD-10-CM | POA: Diagnosis not present

## 2024-03-05 MED ORDER — LINACLOTIDE 72 MCG PO CAPS: 72.0000 ug | ORAL_CAPSULE | Freq: Every day | ORAL | 1 refills | Status: DC

## 2024-03-05 MED ORDER — METFORMIN HCL 500 MG PO TABS: 250.0000 mg | ORAL_TABLET | Freq: Two times a day (BID) | ORAL | 1 refills | Status: DC

## 2024-03-05 MED ORDER — VITAMIN D (ERGOCALCIFEROL) 1.25 MG (50000 UNIT) PO CAPS: ORAL_CAPSULE | ORAL | 1 refills | Status: DC

## 2024-03-05 MED ORDER — SEMAGLUTIDE (1 MG/DOSE) 4 MG/3ML ~~LOC~~ SOPN: 1.0000 mg | PEN_INJECTOR | SUBCUTANEOUS | 1 refills | Status: DC

## 2024-03-05 MED ORDER — TOPIRAMATE 25 MG PO TABS: 25.0000 mg | ORAL_TABLET | Freq: Two times a day (BID) | ORAL | 1 refills | Status: DC

## 2024-03-05 NOTE — Progress Notes (Signed)
 Karen Brennan, D.O.  ABFM, ABOM Specializing in Clinical Bariatric Medicine  Office located at: [address] [place], [place]  [address]    Assessment and Plan:   Medications Discontinued During This Encounter  Medication Reason   topiramate  (TOPAMAX ) 25 MG tablet Reorder   metFORMIN  (GLUCOPHAGE ) 500 MG tablet Reorder   Semaglutide , 1 MG/DOSE, 4 MG/3ML SOPN Reorder   Vitamin D , Ergocalciferol , (DRISDOL ) 1.25 MG (50000 UNIT) CAPS capsule Reorder   linaclotide  (LINZESS ) 72 MCG capsule Reorder     Meds ordered this encounter  Medications   linaclotide  (LINZESS ) 72 MCG capsule    Sig: Take 1 capsule (72 mcg total) by mouth daily before breakfast.    Dispense:  30 capsule    Refill:  1   metFORMIN  (GLUCOPHAGE ) 500 MG tablet    Sig: Take 0.5 tablets (250 mg total) by mouth 2 (two) times daily with a meal.    Dispense:  30 tablet    Refill:  1   Semaglutide , 1 MG/DOSE, 4 MG/3ML SOPN    Sig: Inject 1 mg as directed once a week.    Dispense:  3 mL    Refill:  1   topiramate  (TOPAMAX ) 25 MG tablet    Sig: Take 1 tablet (25 mg total) by mouth 2 (two) times daily.    Dispense:  60 tablet    Refill:  1   Vitamin D , Ergocalciferol , (DRISDOL ) 1.25 MG (50000 UNIT) CAPS capsule    Sig: 1 tab q wed and 1 tab q sun    Dispense:  8 capsule    Refill:  1    ** OV for RF **   Do not send RF request     FOR THE DISEASE OF OBESITY:  Morbid obesity (HCC)-starting bmi 58.45/date 02/01/21 BMI 40.0-44.9, adult (HCC)- current BMI 44.6 Assessment & Plan:  02/06/24 08:00 03/05/24 11:00   Body Fat % 54.1 % 53.7 %  Muscle Mass (lbs) 146.2 lbs 148.6 lbs  Fat Mass (lbs) 181.6 lbs 181.6 lbs  Total Body Water (lbs) 107.6 lbs 109.8 lbs  Visceral Fat Rating  17 17    Total lbs lost to date: -105 lbs Total weight loss percentage to date: -23.70 %   Recommended Dietary Goals Cordie is currently in the action stage of change. As such, her goal is to continue weight management  plan.  She has agreed to switch from the CAT 3 MP to the CAT 2 MP.   Behavioral Intervention We discussed the following today: continue to work on maintaining a reduced calorie state, getting the recommended amount of protein, incorporating whole foods, making healthy choices, staying well hydrated and practicing mindfulness when eating.  Additional resources provided today: Handout on CAT 2 meal plan, Handout on CAT 1-2 breakfast options, Handout on CAT 1-2 lunch options, and Handout on Common Characteristics of Successful Weight Losers and Maintainers   Evidence-based interventions for health behavior change were utilized today including the discussion of self monitoring techniques, problem-solving barriers and SMART goal setting techniques.   Regarding patient's less desirable eating habits and patterns, we employed the technique of small changes.   Goal(s) for next OV: n/a    Recommended Physical Activity Goals Ionia has been advised to work up to 300-450 minutes of moderate intensity aerobic activity a week and strengthening exercises 2-3 times per week for cardiovascular health, weight loss maintenance and preservation of muscle mass.   She may Continue to gradually increase the amount and intensity  of exercise routine   Pharmacotherapy See T2DM and Migraine note.   ASSOCIATED CONDITIONS ADDRESSED TODAY:   Type 2 diabetes mellitus with obesity Assessment & Plan: Lab Results  Component Value Date   HGBA1C 5.3 12/28/2023   HGBA1C 5.5 08/08/2023   HGBA1C 5.5 04/11/2023   INSULIN  11.0 01/12/2022   INSULIN  22.7 02/01/2021    HgbA1c is at goal for age and comorbid conditions. On Metformin  500 mg BID and Ozempic  1 mg weekly with good adherence. She mentions having low fasting sugar readings at times, such as 53. Additionally, she feels the Ozempic  1 mg  is no longer helping her with weight loss. She has also been having more hunger and cravings.Unfortunately, she is unable to  tolerate increasing Ozempic  dose to 2 mg weekly due to side effects (belching, gas, nausea, and other GI sx).   Educated patient that having adequate amounts of protein with each meal is important for increasing muscle mass, stabilizing sugars, controlling hunger and cravings, and improving thermogenesis. Hypoglycemia prevention discussed with the patient.  Eat on a regular basis (every 2-3 hours having a high protein 10:1 ratio item). Shared decision making: DECREASE Metformin  to 250 mg twice daily due to low fasting sugar readings. Discussed potentially switching from Ozempic  to Zepbound, however it would be challenging for her to qualify for Zepbound given well-controlled A1c. Importance of f/up with PCP and all other specialists, as scheduled, was stressed to the patient today     Other constipation Assessment & Plan: Has been managing condition with Linzess . Pt requested refill of medication. Continue on current med regimen as prescribed. Will refill Linzess  today, no dose changes. Stay properly hydrated; drinking half her body weight in ounces of water per day.     Bipolar 1 disorder, mixed (HCC) Assessment & Plan: Patient is established with psychiatry. Currently on Buspar 7.5 mg BID, Prozac 40 mg once daily, Haloperidol 2 mg daily, and Hydroxyzine  25 mg once daily PRN. Good compliance/tolerance. No adverse side effects or acute concerns reported today. Mood is stable. Cont current medication regimen per psychiatry. Behavior modification techniques were discussed today as well to include establishing with a general counselor and increasing exercise as able.     Migraine with aura and without status migrainosus, not intractable Assessment & Plan: On Topiramate  25 mg twice daily with reported good compliance and tolerance. Condition is well controlled. Continue medication; no dose change (refill today). Will continue to monitor.    Vitamin D  deficiency Assessment & Plan: Lab Results   Component Value Date   VD25OH 52.0 10/25/2023   VD25OH 42.2 08/08/2023   VD25OH 34.9 04/11/2023   Pt is doing well on ergocalciferol  50,000 units 2x/wkly. Cont supplementation (refill today). Recheck as deemed medically necessary     Follow up:   Return 04/02/2024 at 12:00 PM.  She was informed of the importance of frequent follow up visits to maximize her success with intensive lifestyle modifications for her multiple health conditions.   Subjective:    Chief complaint: Obesity Tehya is here to discuss her progress with her obesity treatment plan. She is on the Category 3 Plan and states she is following her eating plan approximately 50% of the time. Pt is walking 15 minutes 2 days per week   Interval History:  JESSENYA BERDAN is here for a follow up office visit. Pt has experienced a weight gain of 3 lbs since last OV on 02/06/2024. She attributes this to dealing with some challenges like a recent URI  and having one of her teeth pulled out.  Her dietary and life style habits include:  - Tracking Calories/Macros: no   - Eating More Whole Foods: yes  - Adequate Protein Intake: yes, her proteins of choice are chicken, salmon, shrimp.   - Adequate Water Intake: no   - Skipping Meals: yes  - Sleeping 7-9 Hours/ Night: no    Pharmacotherapy that aid with weight loss: She is currently taking She is currently taking Metformin  500 mg BID, Topiramate  25 mg twice daily, and Ozempic  1 mg once weekly.     Review of Systems:  Pertinent positives were addressed with patient today.  Reviewed by clinician on day of visit: allergies, medications, problem list, medical history, surgical history, family history, social history, and previous encounter notes.  Weight Summary and Biometrics   Weight Lost Since Last Visit: 0lb  Weight Gained Since Last Visit: 3lb   Vitals Temp: 98 F (36.7 C) BP: 126/74 Pulse Rate: 82 SpO2: 97 %   Anthropometric Measurements Height: 6' 1  (1.854 m) Weight: (!) 338 lb (153.3 kg) BMI (Calculated): 44.6 Weight at Last Visit: 335lb Weight Lost Since Last Visit: 0lb Weight Gained Since Last Visit: 3lb Starting Weight: 443lb Total Weight Loss (lbs): 105 lb (47.6 kg)   Body Composition  Body Fat %: 53.7 % Fat Mass (lbs): 181.6 lbs Muscle Mass (lbs): 148.6 lbs Total Body Water (lbs): 109.8 lbs Visceral Fat Rating : 17   Other Clinical Data Fasting: no Labs: no Today's Visit #: 43 Starting Date: 02/01/21    Objective:   PHYSICAL EXAM: Blood pressure 126/74, pulse 82, temperature 98 F (36.7 C), height 6' 1 (1.854 m), weight (!) 338 lb (153.3 kg), SpO2 97%. Body mass index is 44.59 kg/m.  General: she is overweight, cooperative and in no acute distress. PSYCH: Has normal mood, affect and thought process.   HEENT: EOMI, sclerae are anicteric. Lungs: Normal breathing effort, no conversational dyspnea. Extremities: Moves * 4 Neurologic: A and O * 3, good insight  DIAGNOSTIC DATA REVIEWED: BMET    Component Value Date/Time   NA 142 10/25/2023 1413   K 3.9 10/25/2023 1413   CL 104 10/25/2023 1413   CO2 24 10/25/2023 1413   GLUCOSE 89 10/25/2023 1413   GLUCOSE 208 (H) 07/04/2022 1037   BUN 13 10/25/2023 1413   CREATININE 0.66 10/25/2023 1413   CREATININE 0.82 07/05/2016 0942   CALCIUM  8.9 10/25/2023 1413   GFRNONAA >60 07/04/2022 1037   GFRNONAA >89 07/05/2016 0942   GFRAA 99 02/12/2020 1423   GFRAA >89 07/05/2016 0942   Lab Results  Component Value Date   HGBA1C 5.3 12/28/2023   HGBA1C 5.7 09/15/2011   Lab Results  Component Value Date   INSULIN  11.0 01/12/2022   INSULIN  22.7 02/01/2021   Lab Results  Component Value Date   TSH 1.150 04/11/2023   CBC    Component Value Date/Time   WBC 8.7 07/24/2023 1112   WBC 10.4 07/04/2022 1037   RBC 4.54 07/24/2023 1112   RBC 4.76 07/04/2022 1037   HGB 13.3 07/24/2023 1112   HCT 40.6 07/24/2023 1112   PLT 357 07/24/2023 1112   MCV 89 07/24/2023  1112   MCH 29.3 07/24/2023 1112   MCH 29.6 07/04/2022 1037   MCHC 32.8 07/24/2023 1112   MCHC 32.5 07/04/2022 1037   RDW 11.8 07/24/2023 1112   Iron Studies    Component Value Date/Time   IRON 55 10/25/2023 1413   TIBC 407 08/08/2023  1029   FERRITIN 52 08/08/2023 1029   IRONPCTSAT 25 08/08/2023 1029   Lipid Panel     Component Value Date/Time   CHOL 126 02/16/2023 1605   TRIG 62 02/16/2023 1605   HDL 57 02/16/2023 1605   CHOLHDL 2.2 02/16/2023 1605   CHOLHDL 3.8 12/09/2014 1102   VLDL 25 12/09/2014 1102   LDLCALC 56 02/16/2023 1605   Hepatic Function Panel     Component Value Date/Time   PROT 6.3 04/11/2023 1050   ALBUMIN 4.1 04/11/2023 1050   AST 20 04/11/2023 1050   ALT 16 04/11/2023 1050   ALKPHOS 56 04/11/2023 1050   BILITOT 1.1 04/11/2023 1050   BILIDIR 0.1 02/02/2009 2254   IBILI 0.6 02/02/2009 2254      Component Value Date/Time   TSH 1.150 04/11/2023 1050   Nutritional Lab Results  Component Value Date   VD25OH 52.0 10/25/2023   VD25OH 42.2 08/08/2023   VD25OH 34.9 04/11/2023    Attestations:   I, Special Puri, acting as a Stage manager for [employer], DO., have compiled all relevant documentation for today's office visit on behalf of Karen Jenkins, DO, while in the presence of [employer], DO.  I have reviewed the above documentation for accuracy and completeness, and I agree with the above. Karen JINNY Brennan, D.O.  The 21st Century Cures Act was signed into law in 2016 which includes the topic of electronic health records.  This provides immediate access to information in MyChart. This includes consultation notes, operative notes, office notes, lab results and pathology reports.  If you have any questions about what you read please let us  know at your next visit so we can discuss your concerns and take corrective action if need be.  We are right here with you.

## 2024-03-07 DIAGNOSIS — G5793 Unspecified mononeuropathy of bilateral lower limbs: Secondary | ICD-10-CM | POA: Diagnosis not present

## 2024-03-09 ENCOUNTER — Ambulatory Visit
Admission: RE | Admit: 2024-03-09 | Discharge: 2024-03-09 | Disposition: A | Discharge status: Home / Self Care | Source: Ambulatory Visit

## 2024-03-09 ENCOUNTER — Other Ambulatory Visit: Payer: Self-pay

## 2024-03-09 VITALS — BP 116/79 | HR 88 | Temp 98.0°F | Resp 18

## 2024-03-09 DIAGNOSIS — T3695XA Adverse effect of unspecified systemic antibiotic, initial encounter: Secondary | ICD-10-CM | POA: Insufficient documentation

## 2024-03-09 DIAGNOSIS — N3000 Acute cystitis without hematuria: Secondary | ICD-10-CM | POA: Insufficient documentation

## 2024-03-09 DIAGNOSIS — B379 Candidiasis, unspecified: Secondary | ICD-10-CM | POA: Insufficient documentation

## 2024-03-09 LAB — POCT URINE DIPSTICK
Bilirubin, UA: NEGATIVE
Blood, UA: NEGATIVE
Glucose, UA: NEGATIVE mg/dL
Ketones, POC UA: NEGATIVE mg/dL
Nitrite, UA: POSITIVE — AB
POC PROTEIN,UA: NEGATIVE
Spec Grav, UA: >=1.03 — AB (ref 1.010–1.025)
Urobilinogen, UA: 1 U/dL
pH, UA: 5.5 (ref 5.0–8.0)

## 2024-03-09 LAB — POCT URINE PREGNANCY: Preg Test, Ur: NEGATIVE

## 2024-03-09 MED ORDER — CEFUROXIME AXETIL 250 MG PO TABS: 250.0000 mg | ORAL_TABLET | Freq: Two times a day (BID) | ORAL | 0 refills | Status: DC

## 2024-03-09 MED ORDER — FLUCONAZOLE 150 MG PO TABS: 150.0000 mg | ORAL_TABLET | Freq: Every day | ORAL | 0 refills | Status: DC

## 2024-03-09 MED ORDER — PHENAZOPYRIDINE HCL 200 MG PO TABS: 200.0000 mg | ORAL_TABLET | Freq: Three times a day (TID) | ORAL | 0 refills | Status: DC

## 2024-03-09 NOTE — ED Provider Notes (Signed)
 [facility]  Note:  This document was prepared using Conservation officer, historic buildings and may include unintentional dictation errors.  MRN: [id number] DOB: Dec 05, 1975  Subjective:   Karen Brennan is a 48 y.o. female presenting for dysuria, increased urinary frequency, x 3 days.  Patient also reports she has had intermittent chills and low back pain.  Patient denies any known fever, severe abdominal pain, flank pain, hematuria.  Patient states she has frequent history of UTIs for the past several months.  Patient has not taken any over-the-counter medication to treat symptoms.  Most recent urine culture shows no bacterial resistance to antibiotic therapy.  No current facility-administered medications for this encounter.  Current Outpatient Medications:    ARIPiprazole  (ABILIFY ) 20 MG tablet, Take 20 mg by mouth at bedtime as needed., Disp: , Rfl:    atorvastatin  (LIPITOR) 20 MG tablet, Take 1 tablet (20 mg total) by mouth daily., Disp: 90 tablet, Rfl: 3   B Complex-C-Folic Acid  (B COMPLEX-VITAMIN C-FOLIC ACID ) 1 MG tablet, Take 1 tablet by mouth daily with breakfast., Disp: 90 tablet, Rfl: 1   benztropine (COGENTIN) 2 MG tablet, Take 2 mg by mouth 2 (two) times daily., Disp: , Rfl:    busPIRone (BUSPAR) 7.5 MG tablet, Take 7.5 mg by mouth 2 (two) times daily., Disp: , Rfl:    cefUROXime (CEFTIN) 250 MG tablet, Take 1 tablet (250 mg total) by mouth 2 (two) times daily with a meal for 7 days., Disp: 14 tablet, Rfl: 0   Cyanocobalamin  1000 MCG CAPS, Take 1 tablet by mouth daily., Disp: , Rfl:    ferrous sulfate  325 (65 FE) MG tablet, Take 325 mg by mouth in the morning and at bedtime., Disp: , Rfl:    fluconazole  (DIFLUCAN ) 150 MG tablet, Take 1 tablet (150 mg total) by mouth daily. If symptoms persist take second dose of Diflucan  150 mg on day 3 to treat vaginal Candida., Disp: 2 tablet, Rfl: 0   FLUoxetine (PROZAC) 40 MG capsule, Take 40 mg by mouth daily., Disp: , Rfl:     fluticasone  (FLONASE ) 50 MCG/ACT nasal spray, Place 2 sprays into both nostrils daily. Shake well before use. Gently blow nose before spraying. Do not blow nose immediately after use. You should not taste the medication or feel it going down your throat; if you do, adjust your technique., Disp: 16 g, Rfl: 0   hydrOXYzine  (VISTARIL ) 25 MG capsule, Take 25 mg by mouth every evening. Every evening as needed, Disp: , Rfl:    linaclotide  (LINZESS ) 72 MCG capsule, Take 1 capsule (72 mcg total) by mouth daily before breakfast., Disp: 30 capsule, Rfl: 1   metFORMIN  (GLUCOPHAGE ) 500 MG tablet, Take 0.5 tablets (250 mg total) by mouth 2 (two) times daily with a meal., Disp: 30 tablet, Rfl: 1   nystatin  (MYCOSTATIN /NYSTOP ) powder, Apply 1 Application topically 3 (three) times daily., Disp: 15 g, Rfl: 11   OLANZapine (ZYPREXA) 20 MG tablet, Take 20 mg by mouth at bedtime., Disp: , Rfl:    phenazopyridine  (PYRIDIUM ) 200 MG tablet, Take 1 tablet (200 mg total) by mouth 3 (three) times daily., Disp: 6 tablet, Rfl: 0   pregabalin  (LYRICA ) 100 MG capsule, Take 1 capsule (100 mg total) by mouth 3 (three) times daily., Disp: 90 capsule, Rfl: 3   Prenatal Vit-Fe Fum-FA-Omega (PNV PRENATAL PLUS MULTIVIT+DHA) 27-1 & 312 MG MISC, 1 po qd, Disp: 90 each, Rfl: 0   Semaglutide , 1 MG/DOSE, 4 MG/3ML SOPN, Inject 1 mg as directed  once a week., Disp: 3 mL, Rfl: 1   topiramate  (TOPAMAX ) 25 MG tablet, Take 1 tablet (25 mg total) by mouth 2 (two) times daily., Disp: 60 tablet, Rfl: 1   traZODone (DESYREL) 50 MG tablet, Take 50 mg by mouth at bedtime as needed for sleep., Disp: , Rfl:    Vitamin D , Ergocalciferol , (DRISDOL ) 1.25 MG (50000 UNIT) CAPS capsule, 1 tab q wed and 1 tab q sun, Disp: 8 capsule, Rfl: 1   Accu-Chek Softclix Lancets lancets, USE TO test fasting blood sugar EVERY DAY IN THE MORNING, Disp: 100 each, Rfl: 6   albuterol  (VENTOLIN  HFA) 108 (90 Base) MCG/ACT inhaler, inhale 2 PUFFS BY MOUTH every 4 hours as needed FOR  wheezing OR shortness of breath, Disp: 18 g, Rfl: 1   benztropine (COGENTIN) 1 MG tablet, Take 1 mg by mouth 2 (two) times daily., Disp: , Rfl:    blood glucose meter kit and supplies KIT, Dispense based on patient and insurance preference. Use up to four times daily as directed. (FOR ICD-9 250.00, 250.01)., Disp: 1 each, Rfl: 0   Blood Glucose Monitoring Suppl (ACCU-CHEK GUIDE) w/Device KIT, Use to check blood sugar 3x per day. E11.9, Disp: 1 kit, Rfl: 0   glucose blood (ACCU-CHEK GUIDE TEST) test strip, Use to check blood sugar 3x per day. E11.9, Disp: 100 each, Rfl: 12   meclizine  (ANTIVERT ) 12.5 MG tablet, Take 1 tablet (12.5 mg total) by mouth 3 (three) times daily as needed for dizziness., Disp: 20 tablet, Rfl: 0   metoprolol  tartrate (LOPRESSOR ) 25 MG tablet, TAKE ONE-HALF TABLET BY MOUTH TWICE DAILY AS NEEDED, Disp: 90 tablet, Rfl: 3   valsartan  (DIOVAN ) 40 MG tablet, TAKE 1 Tablet BY MOUTH ONCE DAILY at bedtime, Disp: 90 tablet, Rfl: 1   Allergies  Allergen Reactions   Sibutramine Hcl Monohydrate Hives    [place]    Past Medical History:  Diagnosis Date   ANEMIA 08/14/2008   Qualifier: Diagnosis of  By: Joyice MD, Jessica     Anxiety    Asthma    B12 deficiency 08/29/2021   Bipolar 1 disorder (HCC)    Blood transfusion    Cellulitis 07/15/2020   Of right buttock   Chest pain    Depression    Edema of both lower extremities    GERD (gastroesophageal reflux disease)    History of bronchitis    Hypertension    Kidney stones 10/09/2011   Migraines 10/09/2011   often   Morbid obesity with BMI of 50.0-59.9, adult (HCC) 08/09/2006   Qualifier: Diagnosis of   By: Joyice MD, Jessica         Pre-diabetes    Schizophrenia (HCC)    SOB (shortness of breath)    Vitamin D  deficiency 08/29/2021     Past Surgical History:  Procedure Laterality Date   ANKLE FRACTURE SURGERY   1990's   left; put screws in   ENDOMETRIAL ABLATION  ~ 2011   FRACTURE SURGERY     GASTRIC BY  PASS     TUBAL LIGATION  2006    Family History  Problem Relation Age of Onset   Heart failure Mother    Hyperlipidemia Mother    Hypertension Mother    Diabetes Mother    Sudden death Mother    Depression Mother    Bipolar disorder Mother    Schizophrenia Mother    Hypertension Father    Colon cancer Neg Hx    Esophageal cancer Neg Hx  Rectal cancer Neg Hx    Stomach cancer Neg Hx     Social History   Tobacco Use   Smoking status: Former    Current packs/day: 0.00    Average packs/day: 0.3 packs/day for 4.0 years (1.0 ttl pk-yrs)    Types: Cigarettes    Start date: 06/12/2000    Quit date: 06/12/2004    Years since quitting: 19.7   Smokeless tobacco: Never  Vaping Use   Vaping status: Never Used  Substance Use Topics   Alcohol use: No   Drug use: No    ROS Refer to HPI for ROS details.  Objective:   Vitals: BP 116/79   Pulse 88   Temp 98 F (36.7 C) (Oral)   Resp 18   SpO2 96%   Physical Exam Vitals and nursing note reviewed.  Constitutional:      General: She is not in acute distress.    Appearance: Normal appearance. She is well-developed. She is not ill-appearing or toxic-appearing.  HENT:     Head: Normocephalic and atraumatic.  Cardiovascular:     Rate and Rhythm: Normal rate.  Pulmonary:     Effort: Pulmonary effort is normal. No respiratory distress.  Abdominal:     Palpations: Abdomen is soft.     Tenderness: There is no abdominal tenderness. There is no right CVA tenderness or left CVA tenderness.  Skin:    General: Skin is warm and dry.  Neurological:     General: No focal deficit present.     Mental Status: She is alert and oriented to person, place, and time.  Psychiatric:        Mood and Affect: Mood normal.        Behavior: Behavior normal.     Procedures  Results for orders placed or performed during the hospital encounter of 03/09/24 (from the past 24 hours)  POCT urine pregnancy     Status: Normal   Collection Time:  03/09/24  8:55 AM  Result Value Ref Range   Preg Test, Ur Negative Negative  POCT URINE DIPSTICK     Status: Abnormal   Collection Time: 03/09/24  8:56 AM  Result Value Ref Range   Color, UA yellow yellow   Clarity, UA cloudy (A) clear   Glucose, UA negative negative mg/dL   Bilirubin, UA negative negative   Ketones, POC UA negative negative mg/dL   Spec Grav, UA >=8.969 (A) 1.010 - 1.025   Blood, UA negative negative   pH, UA 5.5 5.0 - 8.0   POC PROTEIN,UA negative negative, trace   Urobilinogen, UA 1.0 0.2 or 1.0 E.U./dL   Nitrite, UA Positive (A) Negative   Leukocytes, UA Trace (A) Negative    No results found.   Assessment and Plan :     Discharge Instructions       1. Acute cystitis without hematuria (Primary) - POCT URINE DIPSTICK completed in UC shows trace leukocytes, positive nitrite, no blood, these findings are possibly indicative of urinary tract infection - POCT urine pregnancy completed in UC is negative for pregnancy - Urine Culture collected in UC and sent to lab for further testing results will be available in 2 to 3 days. - cefUROXime (CEFTIN) 250 MG tablet; Take 1 tablet (250 mg total) by mouth 2 (two) times daily with a meal for 7 days.  Dispense: 14 tablet; Refill: 0 - phenazopyridine  (PYRIDIUM ) 200 MG tablet; Take 1 tablet (200 mg total) by mouth 3 (three) times daily.  Dispense:  6 tablet; Refill: 0 -Continue to monitor symptoms for any change in severity if there is any escalation of current symptoms or development of new symptoms follow-up in ER for further evaluation and management.       Nike Southwell B Xela Oregel   [place]hillah Cipriani, [place], [place] 03/09/24 [phone]

## 2024-03-09 NOTE — Discharge Instructions (Addendum)
  1. Acute cystitis without hematuria (Primary) - POCT URINE DIPSTICK completed in UC shows trace leukocytes, positive nitrite, no blood, these findings are possibly indicative of urinary tract infection - POCT urine pregnancy completed in UC is negative for pregnancy - Urine Culture collected in UC and sent to lab for further testing results will be available in 2 to 3 days. - cefUROXime (CEFTIN) 250 MG tablet; Take 1 tablet (250 mg total) by mouth 2 (two) times daily with a meal for 7 days.  Dispense: 14 tablet; Refill: 0 - phenazopyridine  (PYRIDIUM ) 200 MG tablet; Take 1 tablet (200 mg total) by mouth 3 (three) times daily.  Dispense: 6 tablet; Refill: 0 -Continue to monitor symptoms for any change in severity if there is any escalation of current symptoms or development of new symptoms follow-up in ER for further evaluation and management.

## 2024-03-09 NOTE — ED Triage Notes (Signed)
 C/O dysuria, polyuria, and urinary urgency onset 3 days ago with chills. States has been having recurrent UTIs over past few months. C/O low back pain. Denies abd pain.

## 2024-03-10 DIAGNOSIS — G5793 Unspecified mononeuropathy of bilateral lower limbs: Secondary | ICD-10-CM | POA: Diagnosis not present

## 2024-03-11 ENCOUNTER — Ambulatory Visit (HOSPITAL_COMMUNITY): Payer: Self-pay

## 2024-03-11 LAB — URINE CULTURE
Culture: >=100000 — AB
Special Requests: NORMAL

## 2024-03-12 DIAGNOSIS — G5793 Unspecified mononeuropathy of bilateral lower limbs: Secondary | ICD-10-CM | POA: Diagnosis not present

## 2024-03-14 DIAGNOSIS — G5793 Unspecified mononeuropathy of bilateral lower limbs: Secondary | ICD-10-CM | POA: Diagnosis not present

## 2024-03-17 ENCOUNTER — Other Ambulatory Visit: Payer: Self-pay | Admitting: Family Medicine

## 2024-03-17 DIAGNOSIS — J302 Other seasonal allergic rhinitis: Secondary | ICD-10-CM

## 2024-03-17 DIAGNOSIS — G5793 Unspecified mononeuropathy of bilateral lower limbs: Secondary | ICD-10-CM | POA: Diagnosis not present

## 2024-03-19 ENCOUNTER — Telehealth: Admitting: Physician Assistant

## 2024-03-19 DIAGNOSIS — B370 Candidal stomatitis: Secondary | ICD-10-CM | POA: Diagnosis not present

## 2024-03-19 DIAGNOSIS — K5904 Chronic idiopathic constipation: Secondary | ICD-10-CM

## 2024-03-19 DIAGNOSIS — G5793 Unspecified mononeuropathy of bilateral lower limbs: Secondary | ICD-10-CM | POA: Diagnosis not present

## 2024-03-19 MED ORDER — NYSTATIN 100000 UNIT/ML MT SUSP: 5.0000 mL | Freq: Four times a day (QID) | OROMUCOSAL | 0 refills | Status: DC

## 2024-03-19 MED ORDER — POLYETHYLENE GLYCOL 3350 17 GM/SCOOP PO POWD: 17.0000 g | Freq: Every day | ORAL | 0 refills | Status: AC

## 2024-03-19 NOTE — Patient Instructions (Signed)
 Karen Brennan, thank you for joining Delon CHRISTELLA Dickinson, PA-C for today's virtual visit.  While this provider is not your primary care provider (PCP), if your PCP is located in our provider database this encounter information will be shared with them immediately following your visit.   A [facility] MyChart account gives you access to today's visit and all your visits, tests, and labs performed at [facility]  click here if you don't have a [facility] MyChart account or go to [email or web address]  Consent: (Patient) Karen Brennan provided verbal consent for this virtual visit at the beginning of the encounter.  Current Medications:  Current Outpatient Medications:    nystatin  (MYCOSTATIN ) 100000 UNIT/ML suspension, Take 5 mLs (500,000 Units total) by mouth 4 (four) times daily., Disp: 240 mL, Rfl: 0   polyethylene glycol powder (MIRALAX ) 17 GM/SCOOP powder, Take 17 g by mouth daily. Dissolve 1 capful (17g) in 4-8 ounces of liquid and take by mouth daily., Disp: 238 g, Rfl: 0   Accu-Chek Softclix Lancets lancets, USE TO test fasting blood sugar EVERY DAY IN THE MORNING, Disp: 100 each, Rfl: 6   albuterol  (VENTOLIN  HFA) 108 (90 Base) MCG/ACT inhaler, inhale 2 PUFFS BY MOUTH every 4 hours as needed FOR wheezing OR shortness of breath, Disp: 18 g, Rfl: 1   ARIPiprazole  (ABILIFY ) 20 MG tablet, Take 20 mg by mouth at bedtime as needed., Disp: , Rfl:    atorvastatin  (LIPITOR) 20 MG tablet, Take 1 tablet (20 mg total) by mouth daily., Disp: 90 tablet, Rfl: 3   B Complex-C-Folic Acid  (B COMPLEX-VITAMIN C-FOLIC ACID ) 1 MG tablet, Take 1 tablet by mouth daily with breakfast., Disp: 90 tablet, Rfl: 1   benztropine (COGENTIN) 1 MG tablet, Take 1 mg by mouth 2 (two) times daily., Disp: , Rfl:    benztropine (COGENTIN) 2 MG tablet, Take 2 mg by mouth 2 (two) times daily., Disp: , Rfl:    blood glucose meter kit and supplies KIT, Dispense based on patient and insurance preference. Use up  to four times daily as directed. (FOR ICD-9 250.00, 250.01)., Disp: 1 each, Rfl: 0   Blood Glucose Monitoring Suppl (ACCU-CHEK GUIDE) w/Device KIT, Use to check blood sugar 3x per day. E11.9, Disp: 1 kit, Rfl: 0   busPIRone (BUSPAR) 7.5 MG tablet, Take 7.5 mg by mouth 2 (two) times daily., Disp: , Rfl:    Cyanocobalamin  1000 MCG CAPS, Take 1 tablet by mouth daily., Disp: , Rfl:    ferrous sulfate  325 (65 FE) MG tablet, Take 325 mg by mouth in the morning and at bedtime., Disp: , Rfl:    fluconazole  (DIFLUCAN ) 150 MG tablet, Take 1 tablet (150 mg total) by mouth daily. If symptoms persist take second dose of Diflucan  150 mg on day 3 to treat vaginal Candida., Disp: 2 tablet, Rfl: 0   FLUoxetine (PROZAC) 40 MG capsule, Take 40 mg by mouth daily., Disp: , Rfl:    fluticasone  (FLONASE ) 50 MCG/ACT nasal spray, Place 2 sprays into both nostrils daily. Shake well before use. Gently blow nose before spraying. Do not blow nose immediately after use. You should not taste the medication or feel it going down your throat; if you do, adjust your technique., Disp: 16 g, Rfl: 0   glucose blood (ACCU-CHEK GUIDE TEST) test strip, Use to check blood sugar 3x per day. E11.9, Disp: 100 each, Rfl: 12   hydrOXYzine  (VISTARIL ) 25 MG capsule, Take 25 mg by mouth every evening. Every evening as needed,  Disp: , Rfl:    linaclotide  (LINZESS ) 72 MCG capsule, Take 1 capsule (72 mcg total) by mouth daily before breakfast., Disp: 30 capsule, Rfl: 1   meclizine  (ANTIVERT ) 12.5 MG tablet, Take 1 tablet (12.5 mg total) by mouth 3 (three) times daily as needed for dizziness., Disp: 20 tablet, Rfl: 0   metFORMIN  (GLUCOPHAGE ) 500 MG tablet, Take 0.5 tablets (250 mg total) by mouth 2 (two) times daily with a meal., Disp: 30 tablet, Rfl: 1   metoprolol  tartrate (LOPRESSOR ) 25 MG tablet, TAKE ONE-HALF TABLET BY MOUTH TWICE DAILY AS NEEDED, Disp: 90 tablet, Rfl: 3   nystatin  (MYCOSTATIN /NYSTOP ) powder, Apply 1 Application topically 3 (three)  times daily., Disp: 15 g, Rfl: 11   OLANZapine (ZYPREXA) 20 MG tablet, Take 20 mg by mouth at bedtime., Disp: , Rfl:    phenazopyridine  (PYRIDIUM ) 200 MG tablet, Take 1 tablet (200 mg total) by mouth 3 (three) times daily., Disp: 6 tablet, Rfl: 0   pregabalin  (LYRICA ) 100 MG capsule, Take 1 capsule (100 mg total) by mouth 3 (three) times daily., Disp: 90 capsule, Rfl: 3   Prenatal Vit-Fe Fum-FA-Omega (PNV PRENATAL PLUS MULTIVIT+DHA) 27-1 & 312 MG MISC, 1 po qd, Disp: 90 each, Rfl: 0   Semaglutide , 1 MG/DOSE, 4 MG/3ML SOPN, Inject 1 mg as directed once a week., Disp: 3 mL, Rfl: 1   topiramate  (TOPAMAX ) 25 MG tablet, Take 1 tablet (25 mg total) by mouth 2 (two) times daily., Disp: 60 tablet, Rfl: 1   traZODone (DESYREL) 50 MG tablet, Take 50 mg by mouth at bedtime as needed for sleep., Disp: , Rfl:    valsartan  (DIOVAN ) 40 MG tablet, TAKE 1 Tablet BY MOUTH ONCE DAILY at bedtime, Disp: 90 tablet, Rfl: 1   Vitamin D , Ergocalciferol , (DRISDOL ) 1.25 MG (50000 UNIT) CAPS capsule, 1 tab q wed and 1 tab q sun, Disp: 8 capsule, Rfl: 1   Medications ordered in this encounter:  Meds ordered this encounter  Medications   nystatin  (MYCOSTATIN ) 100000 UNIT/ML suspension    Sig: Take 5 mLs (500,000 Units total) by mouth 4 (four) times daily.    Dispense:  240 mL    Refill:  0    Supervising Provider:   BLAISE ALEENE KIDD [[id number]]   polyethylene glycol powder (MIRALAX ) 17 GM/SCOOP powder    Sig: Take 17 g by mouth daily. Dissolve 1 capful (17g) in 4-8 ounces of liquid and take by mouth daily.    Dispense:  238 g    Refill:  0    Supervising Provider:   BLAISE ALEENE KIDD [[id number]]     *If you need refills on other medications prior to your next appointment, please contact your pharmacy*  Follow-Up: Call back or seek an in-person evaluation if the symptoms worsen or if the condition fails to improve as anticipated.  [facility] [phone]  Other Instructions  Oral Thrush, Adult Oral  thrush, also called oral candidiasis, is a fungal infection that develops in the mouth and throat and on the tongue. It causes white patches to form in the mouth and on the tongue. Many cases of thrush are mild, but this infection can also be serious. [place] can be a repeated (recurrent) problem for certain people who have a weak body defense system (immune system). The weakness can be caused by chronic illnesses, or by taking medicines that limit the body's ability to fight infection. If a person has difficulty fighting infection, the fungus that causes thrush can spread  through the body. This can cause life-threatening blood or organ infections. What are the causes? This condition is caused by a fungus (yeast) called Candida albicans. This fungus is normally present in small amounts in the mouth and on other mucous membranes. It usually causes no harm. If conditions are present that allow the fungus to grow without control, it invades surrounding tissues and becomes an infection. Other Candida species can also lead to thrush, though this is rare. What increases the risk? The following factors may make you more likely to develop this condition: Having a weakened immune system. Being an older adult. Having diabetes, cancer, or HIV (human immunodeficiency virus). Having dry mouth (xerostomia). Being pregnant or breastfeeding. Having poor dental care, especially in those who have dentures. Using antibiotic or steroid medicines. What are the signs or symptoms? Symptoms of this condition can vary from mild and moderate to severe and persistent. Symptoms may include: A burning feeling in the mouth and throat. This can occur at the start of a thrush infection. White patches that stick to the mouth and tongue. The tissue around the patches may be red, raw, and painful. If rubbed (during tooth brushing, for example), the patches and the tissue of the mouth may bleed easily. A bad taste in the mouth or  difficulty tasting foods. A cottony feeling in the mouth. Pain during eating and swallowing. Poor appetite. Cracking at the corners of the mouth. How is this diagnosed? This condition is diagnosed based on: A physical exam. Your medical history. How is this treated? This condition is treated with medicines called antifungals, which prevent the growth of fungi. These medicines are either applied directly to the affected area (topical) or swallowed (oral). The treatment will depend on the severity of the condition. Mild cases of thrush may be treated with an antifungal mouth rinse or lozenges. Treatment usually lasts about 14 days. Moderate to severe cases of thrush can be treated with oral antifungal medicine, if they have spread to the esophagus. A topical antifungal medicine may also be used. For some severe infections, treatment may need to continue for more than 14 days. Oral antifungal medicines are rarely used during pregnancy because they may be harmful to the unborn child. If you are pregnant, talk with your health care provider about options for treatment. Persistent or recurrent thrush. For cases of thrush that do not go away or keep coming back: Treatment may be needed twice as long as the symptoms last. Treatment will include both oral and topical antifungal medicines. People with a weakened immune system can take an antifungal medicine on a continuous basis to prevent thrush infections. It is important to treat conditions that make a person more likely to get thrush, such as diabetes or HIV. Follow these instructions at home: Relieving soreness and discomfort To help reduce the discomfort of thrush: Drink cold liquids such as water or iced tea. Try flavored ice treats or frozen juices. Eat foods that are easy to swallow, such as gelatin, ice cream, or custard. Try drinking from a straw if the patches in your mouth are painful.  General instructions Take or use over-the-counter  and prescription medicines only as told by your health care provider. Eat plain, unflavored yogurt as directed by your health care provider. Check the label to make sure the yogurt contains live cultures. This yogurt can help healthy bacteria grow in the mouth and can stop the growth of the fungus that causes thrush. If you wear dentures, remove the dentures  before going to bed, brush them vigorously, and soak them in a cleaning solution as directed by your health care provider. Rinse your mouth with a warm salt-water mixture several times a day. To make a salt-water mixture, dissolve -1 tsp (3-6 g) of salt in 1 cup (237 mL) of warm water. Contact a health care provider if: Your symptoms are getting worse or are not improving within 7 days of starting treatment. You have symptoms of a spreading infection, such as white patches on the skin outside of the mouth. You are breastfeeding your baby and you have redness and pain in the nipples. Summary Oral thrush, also called oral candidiasis, is a fungal infection that develops in the mouth and throat and on the tongue. It causes white patches to form in the mouth and on the tongue. You are more likely to get this condition if you have a weakened immune system or an underlying condition, such as HIV, cancer, or diabetes. This condition is treated with medicines called antifungals, which prevent the growth of fungi. Contact a health care provider if your symptoms do not improve, or get worse, within 7 days of starting treatment. This information is not intended to replace advice given to you by your health care provider. Make sure you discuss any questions you have with your health care provider. Document Revised: 05/15/2022 Document Reviewed: 05/15/2022 Elsevier Patient Education  2024 [employer].   If you have been instructed to have an in-person evaluation today at a local Urgent Care facility, please use the link below. It will take you to a list  of all of our available [facility], including address, phone number and hours of operation. Please do not delay care.  [facility]  If you or a family member do not have a primary care provider, use the link below to schedule a visit and establish care. When you choose a [facility] primary care physician or advanced practice provider, you gain a long-term partner in health. Find a Primary Care Provider  Learn more about [facility]'s in-office and virtual care options:  - Get Care Now

## 2024-03-19 NOTE — Progress Notes (Signed)
 Virtual Visit Consent   Karen Brennan, you are scheduled for a virtual visit with a [facility] provider today. Just as with appointments in the office, your consent must be obtained to participate. Your consent will be active for this visit and any virtual visit you may have with one of our providers in the next 365 days. If you have a MyChart account, a copy of this consent can be sent to you electronically.  As this is a virtual visit, video technology does not allow for your provider to perform a traditional examination. This may limit your provider's ability to fully assess your condition. If your provider identifies any concerns that need to be evaluated in person or the need to arrange testing (such as labs, EKG, etc.), we will make arrangements to do so. Although advances in technology are sophisticated, we cannot ensure that it will always work on either your end or our end. If the connection with a video visit is poor, the visit may have to be switched to a telephone visit. With either a video or telephone visit, we are not always able to ensure that we have a secure connection.  By engaging in this virtual visit, you consent to the provision of healthcare and authorize for your insurance to be billed (if applicable) for the services provided during this visit. Depending on your insurance coverage, you may receive a charge related to this service.  I need to obtain your verbal consent now. Are you willing to proceed with your visit today? Karen Brennan has provided verbal consent on 03/19/2024 for a virtual visit (video or telephone). Karen Karen Dickinson, PA-C  Date: 03/19/2024 3:03 PM   Virtual Visit via Video Note   I, Karen Brennan, connected with  Karen Brennan  ([id number], Aug 19, 1975) on 03/19/24 at  2:45 PM EDT by a video-enabled telemedicine application and verified that I am speaking with the correct person using two identifiers.  Location: Patient: Virtual Visit Location  Patient: Home Provider: Virtual Visit Location Provider: Home Office   I discussed the limitations of evaluation and management by telemedicine and the availability of in person appointments. The patient expressed understanding and agreed to proceed.    History of Present Illness: Karen Brennan is a 48 y.o. who identifies as a female who was assigned female at birth, and is being seen today for constipation, decreased appetite, and loss of taste. Reports her mouth feeling funny. Thee decreased appetite and loss of taste have been present for about a week. She reports a film-like sensation over her tongue. She was recently on antibiotics for a UTI (started them on 03/09/24). Also, has been on multiple antibiotics prior to this, reporting this is her 3rd round for UTIs.   Constipation has been present over last 2 days. This is a chronic issue. She has been prescribed Linzess  72mcg, but has not been taking daily as prescribed. Reports she is waiting on the medication to be delivered. This seems no different than her previous bouts with constipation.    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   BMI 45.0-49.9, adult (HCC)-current bmi 46.8 11/08/2011   Asthma 11/08/2011   Anxiety 10/25/2011   Urinary tract infection with hematuria 10/12/2011   Bipolar 1 disorder, mixed (HCC) 09/15/2011   Allergic rhinitis 08/31/2008   GERD (gastroesophageal reflux disease) 01/24/2008   Hypertension 08/17/2006    Allergies:  Allergies  Allergen  Reactions   Sibutramine Hcl Monohydrate Hives    Meridia   Medications:  Current Outpatient Medications:    nystatin  (MYCOSTATIN ) 100000 UNIT/ML suspension, Take 5 mLs (500,000 Units total) by mouth 4 (four) times daily., Disp: 240 mL, Rfl: 0   polyethylene glycol powder (MIRALAX ) 17 GM/SCOOP powder, Take 17 g by mouth daily. Dissolve 1 capful (17g) in 4-8 ounces of liquid and take by mouth daily., Disp: 238 g, Rfl: 0   Accu-Chek Softclix Lancets lancets, USE TO test fasting blood sugar EVERY DAY IN THE MORNING, Disp: 100 each, Rfl: 6   albuterol  (VENTOLIN  HFA) 108 (90 Base) MCG/ACT inhaler, inhale 2 PUFFS BY MOUTH every 4 hours as needed FOR wheezing OR shortness of breath, Disp: 18 g, Rfl: 1   ARIPiprazole  (ABILIFY ) 20 MG tablet, Take 20 mg by mouth at bedtime as needed., Disp: , Rfl:    atorvastatin  (LIPITOR) 20 MG tablet, Take 1 tablet (20 mg total) by mouth daily., Disp: 90 tablet, Rfl: 3   B Complex-C-Folic Acid  (B COMPLEX-VITAMIN C-FOLIC ACID ) 1 MG tablet, Take 1 tablet by mouth daily with breakfast., Disp: 90 tablet, Rfl: 1   benztropine (COGENTIN) 1 MG tablet, Take 1 mg by mouth 2 (two) times daily., Disp: , Rfl:    benztropine (COGENTIN) 2 MG tablet, Take 2 mg by mouth 2 (two) times daily., Disp: , Rfl:    blood glucose meter kit and supplies KIT, Dispense based on patient and insurance preference. Use up to four times daily as directed. (FOR ICD-9 250.00, 250.01)., Disp: 1 each, Rfl: 0   Blood Glucose Monitoring Suppl (ACCU-CHEK GUIDE) w/Device KIT, Use to check blood sugar 3x per day. E11.9, Disp: 1 kit, Rfl: 0   busPIRone (BUSPAR) 7.5 MG tablet, Take 7.5 mg by mouth 2 (two) times daily., Disp: , Rfl:    Cyanocobalamin  1000 MCG CAPS, Take 1 tablet by mouth daily., Disp: , Rfl:    ferrous sulfate  325 (65 FE) MG tablet, Take 325 mg by mouth in the morning and at bedtime., Disp: , Rfl:    fluconazole  (DIFLUCAN ) 150 MG tablet, Take 1 tablet (150 mg total) by mouth daily. If symptoms  persist take second dose of Diflucan  150 mg on day 3 to treat vaginal Candida., Disp: 2 tablet, Rfl: 0   FLUoxetine (PROZAC) 40 MG capsule, Take 40 mg by mouth daily., Disp: , Rfl:    fluticasone  (FLONASE ) 50 MCG/ACT nasal spray, Place 2 sprays into both nostrils daily. Shake well before use. Gently blow nose before spraying. Do not blow nose immediately after use. You should not taste the medication or feel it going down your throat; if you do, adjust your technique., Disp: 16 g, Rfl: 0   glucose blood (ACCU-CHEK GUIDE TEST) test strip, Use to check blood sugar 3x per day. E11.9, Disp: 100 each, Rfl: 12   hydrOXYzine  (VISTARIL ) 25 MG capsule, Take 25 mg by mouth every evening. Every evening as needed, Disp: , Rfl:    linaclotide  (LINZESS ) 72 MCG capsule, Take 1 capsule (72 mcg total) by mouth daily before breakfast., Disp: 30 capsule, Rfl: 1   meclizine  (ANTIVERT ) 12.5  MG tablet, Take 1 tablet (12.5 mg total) by mouth 3 (three) times daily as needed for dizziness., Disp: 20 tablet, Rfl: 0   metFORMIN  (GLUCOPHAGE ) 500 MG tablet, Take 0.5 tablets (250 mg total) by mouth 2 (two) times daily with a meal., Disp: 30 tablet, Rfl: 1   metoprolol  tartrate (LOPRESSOR ) 25 MG tablet, TAKE ONE-HALF TABLET BY MOUTH TWICE DAILY AS NEEDED, Disp: 90 tablet, Rfl: 3   nystatin  (MYCOSTATIN /NYSTOP ) powder, Apply 1 Application topically 3 (three) times daily., Disp: 15 g, Rfl: 11   OLANZapine (ZYPREXA) 20 MG tablet, Take 20 mg by mouth at bedtime., Disp: , Rfl:    phenazopyridine  (PYRIDIUM ) 200 MG tablet, Take 1 tablet (200 mg total) by mouth 3 (three) times daily., Disp: 6 tablet, Rfl: 0   pregabalin  (LYRICA ) 100 MG capsule, Take 1 capsule (100 mg total) by mouth 3 (three) times daily., Disp: 90 capsule, Rfl: 3   Prenatal Vit-Fe Fum-FA-Omega (PNV PRENATAL PLUS MULTIVIT+DHA) 27-1 & 312 MG MISC, 1 po qd, Disp: 90 each, Rfl: 0   Semaglutide , 1 MG/DOSE, 4 MG/3ML SOPN, Inject 1 mg as directed once a week., Disp: 3 mL, Rfl: 1    topiramate  (TOPAMAX ) 25 MG tablet, Take 1 tablet (25 mg total) by mouth 2 (two) times daily., Disp: 60 tablet, Rfl: 1   traZODone (DESYREL) 50 MG tablet, Take 50 mg by mouth at bedtime as needed for sleep., Disp: , Rfl:    valsartan  (DIOVAN ) 40 MG tablet, TAKE 1 Tablet BY MOUTH ONCE DAILY at bedtime, Disp: 90 tablet, Rfl: 1   Vitamin D , Ergocalciferol , (DRISDOL ) 1.25 MG (50000 UNIT) CAPS capsule, 1 tab q wed and 1 tab q sun, Disp: 8 capsule, Rfl: 1  Observations/Objective: Patient is well-developed, well-nourished in no acute distress.  Resting comfortably at home.  Head is normocephalic, atraumatic.  No labored breathing.  Speech is clear and coherent with logical content.  Patient is alert and oriented at baseline.    Assessment and Plan: 1. Thrush (Primary) - nystatin  (MYCOSTATIN ) 100000 UNIT/ML suspension; Take 5 mLs (500,000 Units total) by mouth 4 (four) times daily.  Dispense: 240 mL; Refill: 0  2. Chronic idiopathic constipation - polyethylene glycol powder (MIRALAX ) 17 GM/SCOOP powder; Take 17 g by mouth daily. Dissolve 1 capful (17g) in 4-8 ounces of liquid and take by mouth daily.  Dispense: 238 g; Refill: 0  - Suspect loss of taste and funny feeling in mouth is thrush from recent antibiotics.  - Add Nystatin  suspension - Salt water gargles - Constipation is chronic - Add Miralax  while awaiting to get Linzess  prescription - Push fluids - Increase dietary fiber - Seek in person evaluation if worsening or fails to improve  Follow Up Instructions: I discussed the assessment and treatment plan with the patient. The patient was provided an opportunity to ask questions and all were answered. The patient agreed with the plan and demonstrated an understanding of the instructions.  A copy of instructions were sent to the patient via MyChart unless otherwise noted below.    The patient was advised to call back or seek an in-person evaluation if the symptoms worsen or if the  condition fails to improve as anticipated.    Karen Karen Dickinson, PA-C

## 2024-03-21 DIAGNOSIS — G5793 Unspecified mononeuropathy of bilateral lower limbs: Secondary | ICD-10-CM | POA: Diagnosis not present

## 2024-03-24 DIAGNOSIS — G5793 Unspecified mononeuropathy of bilateral lower limbs: Secondary | ICD-10-CM | POA: Diagnosis not present

## 2024-03-25 ENCOUNTER — Other Ambulatory Visit: Payer: Self-pay

## 2024-03-25 ENCOUNTER — Other Ambulatory Visit: Payer: Self-pay | Admitting: *Deleted

## 2024-03-25 VITALS — Wt 322.0 lb

## 2024-03-25 DIAGNOSIS — I10 Essential (primary) hypertension: Secondary | ICD-10-CM

## 2024-03-25 NOTE — Patient Instructions (Signed)
 Visit Information  Ms. Karen Brennan was given information about Medicaid Managed Care team care coordination services as a part of their [facility] Community Plan Medicaid benefit.   If you would like to schedule transportation through your [facility], please call the following number at least 2 days in advance of your appointment: [phone]   Rides for urgent appointments can also be made after hours by calling Member Services.  Call the Behavioral Health Crisis Line at [phone], at any time, 24 hours a day, 7 days a week. If you are in danger or need immediate medical attention call 911.  Please see education materials related to HTN provided by MyChart link.  Patient verbalizes understanding of instructions and care plan provided today and agrees to view in MyChart. Active MyChart status and patient understanding of how to access instructions and care plan via MyChart confirmed with patient.     Telephone follow up appointment with Managed Medicaid care management team member scheduled for:  04/11/24 @ 11 am  Irva Loser, RN, BSN, [place] RN Care Manager [facility] [phone]   Following is a copy of your plan of care:  There are no care plans that you recently modified to display for this patient.  Visit Information  Ms. Karen Brennan was given information about Medicaid Managed Care team care coordination services as a part of their [facility] Community Plan Medicaid benefit.   If you would like to schedule transportation through your [facility], please call the following number at least 2 days in advance of your appointment: [phone]   Rides for urgent appointments can also be made after hours by calling Member Services.  Call the Behavioral Health Crisis Line at [phone], at any time, 24 hours a day, 7 days a week. If you are in danger or need immediate medical attention call 911.  Please see education  materials related to HTN provided by MyChart link.  Patient verbalizes understanding of instructions and care plan provided today and agrees to view in MyChart. Active MyChart status and patient understanding of how to access instructions and care plan via MyChart confirmed with patient.     Telephone follow up appointment with Managed Medicaid care management team member scheduled for: 04/11/24 @ 11 am  Zayla Agar, Charity fundraiser, Scientist, research (physical sciences), Theatre manager [employer] [phone]

## 2024-03-25 NOTE — Patient Outreach (Signed)
 Complex Care Management   Visit Note  03/25/2024  Name:  Karen Brennan MRN: [id number] DOB: 1975-08-21  Situation: Referral received for Complex Care Management related to SDOH Barriers:  Food insecurity Depression and HTN I obtained verbal consent from Patient.  Visit completed with Patient  on the phone  Background:   Past Medical History:  Diagnosis Date   ANEMIA 08/14/2008   Qualifier: Diagnosis of  By: Joyice MD, Jessica     Anxiety    Asthma    B12 deficiency 08/29/2021   Bipolar 1 disorder (HCC)    Blood transfusion    Cellulitis 07/15/2020   Of right buttock   Chest pain    Depression    Edema of both lower extremities    GERD (gastroesophageal reflux disease)    History of bronchitis    Hypertension    Kidney stones 10/09/2011   Migraines 10/09/2011   often   Morbid obesity with BMI of 50.0-59.9, adult (HCC) 08/09/2006   Qualifier: Diagnosis of   By: Joyice MD, Jessica         Pre-diabetes    Schizophrenia (HCC)    SOB (shortness of breath)    Vitamin D  deficiency 08/29/2021    Assessment: Patient Reported Symptoms:  Cognitive Cognitive Status: Able to follow simple commands, Alert and oriented to person, place, and time, Insightful and able to interpret abstract concepts, Normal speech and language skills Cognitive/Intellectual Conditions Management [RPT]: None reported or documented in medical history or problem list   Health Maintenance Behaviors: Annual physical exam, Healthy diet, Stress management, Spiritual practice(s), Sleep adequate Healing Pattern: Average Health Facilitated by: Healthy diet, Prayer/meditation, Rest, Stress management  Neurological Neurological Review of Symptoms: Dizziness, Headaches Neurological Management Strategies: Adequate rest, Coping strategies, Routine screening, Medication therapy Neurological Self-Management Outcome: 3 (uncertain) Neurological Comment: Patient reports that she takes Topomax for Headaches.  HEENT  HEENT Symptoms Reported: No symptoms reported HEENT Management Strategies: Adequate rest, Routine screening HEENT Self-Management Outcome: 4 (good)    Cardiovascular Cardiovascular Symptoms Reported: Swelling in legs or feet Does patient have uncontrolled Hypertension?: No Cardiovascular Management Strategies: Weight management, Adequate rest, Routine screening Do You Have a Working Readable Scale?: Yes Weight: (!) 322 lb (146.1 kg) Cardiovascular Comment: Patient reports that her weight yesterday was 3221lbs.  She is taking Omezipic to help with weight loss. Reports swelling in her legs and feet.  Report swelling is due to plantar fasciitis.  Respiratory Respiratory Symptoms Reported: Shortness of breath Other Respiratory Symptoms: Patient reports shortness of breath when she walks.  She reports that she is using her albuterol  inhaler. Respiratory Management Strategies: Adequate rest, Coping strategies, Medication therapy, Routine screening Respiratory Self-Management Outcome: 3 (uncertain)  Endocrine Is patient diabetic?: Yes Is patient checking blood sugars at home?: Yes List most recent blood sugar readings, include date and time of day: Reports that blood sugar was 58 yesterday.  She reports that she takes her blood sugar when she feels bad.  Discussed hypoglycemia episode management.  Informed patient to eat fast-acting carbohydrate (15-20 grams): - Glucose tablets or gel  4 ounces (120 ml) fruit juice or regular soda  1 tablespoon sugar or honey  Wait 15 minutes, then recheck blood glucose  If still low, repeat carbohydrate intake  Once glucose normal, follow with a longer-acting carbohydrate (e.g., sandwich or snack with protein)  Patient reported that she did not eat enough. Endocrine Self-Management Outcome: 3 (uncertain)  Gastrointestinal Gastrointestinal Symptoms Reported: Constipation Additional Gastrointestinal Details: Patient report occassional constipation.  Patient reports that  she takes Linzess  and Miralax  as needed. Gastrointestinal Management Strategies: Adequate rest, Activity Gastrointestinal Self-Management Outcome: 4 (good)    Genitourinary Genitourinary Symptoms Reported: No symptoms reported Genitourinary Management Strategies: Adequate rest  Integumentary Integumentary Symptoms Reported: Other Other Integumentary Symptoms: Pateint reports that she has very dry skin.  Patient reports that she has eczema.  She reports that she uses Eucerin  and Cerva to help with dry skin. Skin Management Strategies: Adequate rest, Activity, Routine screening, Coping strategies Skin Self-Management Outcome: 3 (uncertain)  Musculoskeletal Musculoskelatal Symptoms Reviewed: Other, Weakness Other Musculoskeletal Symptoms: Patient reports that she has plantar fascitis which has caused her feet to be in pain and some weakness when she has pain in her feet bilatery.  She reports that her pain in 5/10 on pain scale.  She using Tylenol  and Motrin  to help with the pain and cold ice bottle. Musculoskeletal Management Strategies: Adequate rest, Medication therapy, Routine screening Musculoskeletal Self-Management Outcome: 3 (uncertain) Falls in the past year?: Yes Number of falls in past year: 1 or less Was there an injury with Fall?: No Fall Risk Category Calculator: 1 Patient Fall Risk Level: Low Fall Risk Fall risk Follow up: Falls evaluation completed, Education provided, Falls prevention discussed  Psychosocial Psychosocial Symptoms Reported: Anxiety - if selected complete GAD, Depression - if selected complete PHQ 2-9 Additional Psychological Details: Patient reports that she takes Prozac, Zyprexa, Hydroxyzine  and to help with Anxiety and Depression.  She was agreeable to LCSW services for couseling and Mental health resources. Behavioral Management Strategies: Activity, Coping strategies, Counseling, Support system, Adequate rest, Medication therapy Behavioral Health  Self-Management Outcome: 3 (uncertain) Major Change/Loss/Stressor/Fears (CP): Medical condition, self Techniques to Cope with Loss/Stress/Change: Counseling, Medication, Spiritual practice(s) Quality of Family Relationships: helpful, involved, supportive Do you feel physically threatened by others?: No    03/25/2024    PHQ2-9 Depression Screening   Little interest or pleasure in doing things Several days  Feeling down, depressed, or hopeless Several days  PHQ-2 - Total Score 2  Trouble falling or staying asleep, or sleeping too much Several days  Feeling tired or having little energy Several days  Poor appetite or overeating  Several days  Feeling bad about yourself - or that you are a failure or have let yourself or your family down Several days  Trouble concentrating on things, such as reading the newspaper or watching television Several days  Moving or speaking so slowly that other people could have noticed.  Or the opposite - being so fidgety or restless that you have been moving around a lot more than usual Not at all  Thoughts that you would be better off dead, or hurting yourself in some way Not at all  PHQ2-9 Total Score 7  If you checked off any problems, how difficult have these problems made it for you to do your work, take care of things at home, or get along with other people Not difficult at all  Depression Interventions/Treatment Medication, Counseling, Currently on Treatment    There were no vitals filed for this visit.  Medications Reviewed Today     Reviewed by Jorja Nichole LABOR, RN (Case Manager) on 03/25/24 at 1451  Med List Status: <None>   Medication Order Taking? Sig Documenting Provider Last Dose Status Informant  Accu-Chek Softclix Lancets lancets [id number]  USE TO test fasting blood sugar EVERY DAY IN THE MORNING Everhart, Kirstie, DO  Active   albuterol  (VENTOLIN  HFA) 108 (90 Base) MCG/ACT inhaler [id number] Yes  inhale 2 PUFFS BY MOUTH every 4 hours as needed  FOR wheezing OR shortness of breath Hindel, Leah, MD  Active   ARIPiprazole  (ABILIFY ) 20 MG tablet [id number]  Take 20 mg by mouth at bedtime as needed.  Patient not taking: Reported on 03/25/2024   [provider]  Active   atorvastatin  (LIPITOR) 20 MG tablet [id number] Yes Take 1 tablet (20 mg total) by mouth daily. Everhart, Kirstie, DO  Active   B Complex-C-Folic Acid  (B COMPLEX-VITAMIN C-FOLIC ACID ) 1 MG tablet [id number] Yes Take 1 tablet by mouth daily with breakfast. Midge Sober, DO  Active   benztropine (COGENTIN) 1 MG tablet [id number] Yes Take 1 mg by mouth 2 (two) times daily. [provider]  Active Self  benztropine (COGENTIN) 2 MG tablet [id number] Yes Take 2 mg by mouth 2 (two) times daily. [provider]  Active   blood glucose meter kit and supplies KIT [id number] Yes Dispense based on patient and insurance preference. Use up to four times daily as directed. (FOR ICD-9 250.00, 250.01). Everhart, Kirstie, DO  Active   Blood Glucose Monitoring Suppl (ACCU-CHEK GUIDE) w/Device KIT [id number] Yes Use to check blood sugar 3x per day. E11.9 Everhart, Kirstie, DO  Active   busPIRone (BUSPAR) 7.5 MG tablet [id number]  Take 7.5 mg by mouth 2 (two) times daily.  Patient not taking: Reported on 03/25/2024   [provider]  Active   Cyanocobalamin  1000 MCG CAPS [id number]  Take 1 tablet by mouth daily. [provider]  Active   ferrous sulfate  325 (65 FE) MG tablet [id number] Yes Take 325 mg by mouth in the morning and at bedtime. [provider]  Active   fluconazole  (DIFLUCAN ) 150 MG tablet [id number]  Take 1 tablet (150 mg total) by mouth daily. If symptoms persist take second dose of Diflucan  150 mg on day 3 to treat vaginal Candida.  Patient not taking: Reported on 03/25/2024   Reddick, Johnathan B, NP  Active   FLUoxetine (PROZAC) 40 MG capsule [id number] Yes Take 40 mg by mouth daily. [provider]  Active Self  fluticasone   (FLONASE ) 50 MCG/ACT nasal spray [id number] Yes Place 2 sprays into both nostrils daily. Shake well before use. Gently blow nose before spraying. Do not blow nose immediately after use. You should not taste the medication or feel it going down your throat; if you do, adjust your technique. Adele Song, MD  Active   glucose blood (ACCU-CHEK GUIDE TEST) test strip [id number] Yes Use to check blood sugar 3x per day. E11.9 Everhart, Kirstie, DO  Active   hydrOXYzine  (VISTARIL ) 25 MG capsule [id number] Yes Take 25 mg by mouth every evening. Every evening as needed [provider]  Active   linaclotide  (LINZESS ) 72 MCG capsule [id number] Yes Take 1 capsule (72 mcg total) by mouth daily before breakfast. Midge Sober, DO  Active   meclizine  (ANTIVERT ) 12.5 MG tablet [id number]  Take 1 tablet (12.5 mg total) by mouth 3 (three) times daily as needed for dizziness.  Patient not taking: Reported on 03/25/2024   Bryan Bianchi, MD  Active   metFORMIN  (GLUCOPHAGE ) 500 MG tablet [id number] Yes Take 0.5 tablets (250 mg total) by mouth 2 (two) times daily with a meal. Opalski, Deborah, DO  Active   metoprolol  tartrate (LOPRESSOR ) 25 MG tablet [id number]  TAKE ONE-HALF TABLET BY MOUTH TWICE DAILY AS NEEDED  Patient not taking: Reported on 03/25/2024   Loistine Sober, NP  Active   nystatin  (MYCOSTATIN ) 100000 UNIT/ML suspension  [id number] Yes Take 5 mLs (500,000 Units total) by mouth 4 (four) times daily. Vivienne Nest M, PA-C  Active   nystatin  (MYCOSTATIN /NYSTOP ) powder [id number] Yes Apply 1 Application topically 3 (three) times daily. Adele Song, MD  Active   OLANZapine (ZYPREXA) 20 MG tablet [id number] Yes Take 20 mg by mouth at bedtime. [provider]  Active   phenazopyridine  (PYRIDIUM ) 200 MG tablet [id number]  Take 1 tablet (200 mg total) by mouth 3 (three) times daily. Reddick, Johnathan B, NP  Active   polyethylene glycol powder (MIRALAX ) 17 GM/SCOOP powder [id number] Yes Take 17 g by  mouth daily. Dissolve 1 capful (17g) in 4-8 ounces of liquid and take by mouth daily. Vivienne Nest M, PA-C  Active   pregabalin  (LYRICA ) 100 MG capsule [id number] Yes Take 1 capsule (100 mg total) by mouth 3 (three) times daily. Christine Rush, DPM  Active   Prenatal Vit-Fe Fum-FA-Omega (PNV PRENATAL PLUS MULTIVIT+DHA) 27-1 & 312 MG MISC [id number] Yes 1 po qd Midge Sober, DO  Active   Semaglutide , 1 MG/DOSE, 4 MG/3ML SOPN [id number] Yes Inject 1 mg as directed once a week. Midge Sober, DO  Active   topiramate  (TOPAMAX ) 25 MG tablet [id number] Yes Take 1 tablet (25 mg total) by mouth 2 (two) times daily. Midge Sober, DO  Active   traZODone (DESYREL) 50 MG tablet [id number] Yes Take 50 mg by mouth at bedtime as needed for sleep. [provider]  Active Self  valsartan  (DIOVAN ) 40 MG tablet [id number]  TAKE 1 Tablet BY MOUTH ONCE DAILY at bedtime  Patient not taking: Reported on 03/25/2024   Adele Song, MD  Active   Vitamin D , Ergocalciferol , (DRISDOL ) 1.25 MG (50000 UNIT) CAPS capsule [id number] Yes 1 tab q wed and 1 tab q sun Midge Sober, DO  Active             Recommendation:   PCP Follow-up Continue Current Plan of Care  Follow Up Plan:   Telephone follow-up 2 weeks- 04/11/24 @ 11 am  Shanetha Bradham, RN, Scientist, research (physical sciences), Theatre manager [employer] [phone]

## 2024-03-26 DIAGNOSIS — G5793 Unspecified mononeuropathy of bilateral lower limbs: Secondary | ICD-10-CM | POA: Diagnosis not present

## 2024-03-28 ENCOUNTER — Telehealth: Payer: Self-pay

## 2024-03-28 ENCOUNTER — Ambulatory Visit: Admitting: Family Medicine

## 2024-03-28 VITALS — BP 102/75 | HR 82 | Ht 73.0 in | Wt 339.2 lb

## 2024-03-28 DIAGNOSIS — N3000 Acute cystitis without hematuria: Secondary | ICD-10-CM

## 2024-03-28 DIAGNOSIS — R350 Frequency of micturition: Secondary | ICD-10-CM | POA: Diagnosis present

## 2024-03-28 DIAGNOSIS — Z23 Encounter for immunization: Secondary | ICD-10-CM | POA: Diagnosis not present

## 2024-03-28 DIAGNOSIS — N390 Urinary tract infection, site not specified: Secondary | ICD-10-CM

## 2024-03-28 DIAGNOSIS — G5793 Unspecified mononeuropathy of bilateral lower limbs: Secondary | ICD-10-CM | POA: Diagnosis not present

## 2024-03-28 LAB — POCT URINALYSIS DIP (MANUAL ENTRY)
Bilirubin, UA: NEGATIVE
Blood, UA: NEGATIVE
Glucose, UA: NEGATIVE mg/dL
Nitrite, UA: POSITIVE — AB
Protein Ur, POC: NEGATIVE mg/dL
Spec Grav, UA: 1.025 (ref 1.010–1.025)
Urobilinogen, UA: 0.2 U/dL
pH, UA: 5.5 (ref 5.0–8.0)

## 2024-03-28 MED ORDER — CEPHALEXIN 500 MG PO CAPS: 500.0000 mg | ORAL_CAPSULE | Freq: Two times a day (BID) | ORAL | 0 refills | Status: AC

## 2024-03-28 MED ORDER — PHENAZOPYRIDINE HCL 200 MG PO TABS: 200.0000 mg | ORAL_TABLET | Freq: Three times a day (TID) | ORAL | 0 refills | Status: DC

## 2024-03-28 NOTE — Addendum Note (Signed)
 Addended byBETHA COWARD, Averyanna Sax on: 03/28/2024 04:48 PM   Modules accepted: Orders

## 2024-03-28 NOTE — Telephone Encounter (Signed)
 Sent rx for keflex  500mg  BID x7d

## 2024-03-28 NOTE — Progress Notes (Signed)
    SUBJECTIVE:   CHIEF COMPLAINT / HPI:   Discussed the use of AI scribe software for clinical note transcription with the patient, who gave verbal consent to proceed.  History of Present Illness Karen Brennan is a 48 year old female who presents with recurrent urinary tract infection symptoms.  Recurrent urinary tract infection symptoms - Four episodes of urinary tract infection symptoms in the past three months - Most recent episode occurred two weeks ago - Treated with cefuroxime and Pyridium  at urgent care during the most recent episode - Initial improvement in symptoms followed by recurrence  Urinary and systemic symptoms - Bilateral lower back pain - Malodorous urine - No fever - No hematuria - No vomiting - No abdominal pain - No abnormal vaginal discharge  Urolithiasis history - History of kidney stones but this does not feel similar     PERTINENT  PMH / PSH: Hypertension, asthma, GERD, T2DM, HLD, multiple UTIs, bipolar 1 disorder  OBJECTIVE:   BP 102/75   Pulse 82   Ht 6' 1 (1.854 m)   Wt (!) 339 lb 4 oz (153.9 kg)   SpO2 100%   BMI 44.76 kg/m    Physical Exam General: NAD, pleasant, able to participate in exam Cardiac: RRR, no murmurs auscultated Respiratory: CTAB, normal WOB Abdomen: soft, non-tender, non-distended, normoactive bowel sounds.  No CVA tenderness bilaterally Extremities: warm and well perfused, no edema or cyanosis Skin: warm and dry, no rashes noted Neuro: alert, no obvious focal deficits, speech normal Psych: Normal affect and mood    ASSESSMENT/PLAN:    Assessment & Plan Recurrent UTI Acute cystitis without hematuria Recurrent UTIs over three months, recent episode two weeks ago. Urine dipstick indicates infection. Question whether she is colonized. Urology referral needed for further evaluation and management given >3 recurrent UTIs over the past 6 months.  No evidence of pyelonephritis today - Prescribed cefuroxime based  on previous sensitivity. - Prescribed Pyridium  for symptom relief. - Sent urine for culture to identify organisms. - Referred to urology for further evaluation. - Advised to report worsening symptoms such as flank pain, abdominal pain, fever, or vomiting.   Payton Coward, MD [facility]

## 2024-03-28 NOTE — Patient Instructions (Signed)
  VISIT SUMMARY: You visited us  today due to recurrent urinary tract infection (UTI) symptoms. You have experienced four episodes in the past three months, with the most recent one occurring two weeks ago.  YOUR PLAN: RECURRENT URINARY TRACT INFECTIONS: You have had multiple UTIs over the past three months, with symptoms including lower back pain and malodorous urine. There is no fever, blood in urine, vomiting, abdominal pain, or abnormal vaginal discharge. -You have been prescribed cefuroxime based on previous sensitivity to treat the infection. -You have been prescribed Pyridium  to help relieve your symptoms. -A urine sample has been sent for culture to identify the organisms causing the infection. -You have been referred to a urologist for further evaluation and management. -Please report any worsening symptoms such as flank pain, abdominal pain, fever, or vomiting immediately.                      Contains text generated by Abridge.                                 Contains text generated by Abridge.

## 2024-03-28 NOTE — Telephone Encounter (Signed)
 Received call from pharmacist regarding prescription for Cefuroxime. They do not have prescription in stock.   They are asking if provider would be able to switch to an alternative. They have both cephalexin  and cefdinir in stock.   Please advise if change is appropriate. If so, lease send new prescription to [facility].   Karen JAYSON English, RN

## 2024-03-28 NOTE — Telephone Encounter (Signed)
 Called patient and advised of change in therapy.   Pharmacy will contact her once rx is ready for pick up.   Patient appreciative.   Chiquita JAYSON English, RN

## 2024-03-31 DIAGNOSIS — G5793 Unspecified mononeuropathy of bilateral lower limbs: Secondary | ICD-10-CM | POA: Diagnosis not present

## 2024-04-02 ENCOUNTER — Ambulatory Visit (INDEPENDENT_AMBULATORY_CARE_PROVIDER_SITE_OTHER): Admitting: Family Medicine

## 2024-04-02 DIAGNOSIS — G5793 Unspecified mononeuropathy of bilateral lower limbs: Secondary | ICD-10-CM | POA: Diagnosis not present

## 2024-04-03 ENCOUNTER — Ambulatory Visit: Payer: Self-pay | Admitting: Family Medicine

## 2024-04-03 LAB — URINE CULTURE

## 2024-04-04 DIAGNOSIS — G5793 Unspecified mononeuropathy of bilateral lower limbs: Secondary | ICD-10-CM | POA: Diagnosis not present

## 2024-04-07 DIAGNOSIS — G5793 Unspecified mononeuropathy of bilateral lower limbs: Secondary | ICD-10-CM | POA: Diagnosis not present

## 2024-04-08 ENCOUNTER — Other Ambulatory Visit: Payer: Self-pay

## 2024-04-08 ENCOUNTER — Other Ambulatory Visit: Payer: Self-pay | Admitting: Family Medicine

## 2024-04-08 DIAGNOSIS — E1141 Type 2 diabetes mellitus with diabetic mononeuropathy: Secondary | ICD-10-CM

## 2024-04-08 NOTE — Patient Outreach (Signed)
 Social Drivers of Health  Community Resource and Care Coordination Visit Note   04/08/2024  Name: Karen Brennan MRN: [id number] DOB:December 25, 1975  Situation: Referral received for [facility] needs assessment and assistance related to utility disconnection notice. I obtained verbal consent from Patient.  Visit completed with Patient on the phone.   Background:   SDOH Interventions Today    Flowsheet Row Most Recent Value  SDOH Interventions   Food Insecurity Interventions Intervention Not Indicated  [patient confirmed she recieves FNS and is ok on food right now.]  Housing Interventions Intervention Not Indicated  Transportation Interventions Intervention Not Indicated  Utilities Interventions Community Resources Provided  Financial Strain Interventions Intervention Not Indicated     Assessment:  BSW held initial outreach appt with pt. Pt was alert and cognitive. SDOH needs were assessed and the following needs were identified and confirmed by pt: utility assistance with current utility disconnection notice. Pt confirmed she has a utility disconnection notice for 11/17 with [employer]. Pt states she would like resources where she can get help with utility bill. BSW explained most resources are tied to the availability of funding, but will provide resources via daughters email ([email or web address] [email or web address]). Pt understood and agreed to f/u with her daughter about the resources. Pt states she has pain in her feet and has dry, cracked skin. Pt would like BSW to in-basket her PCP re a pod referral. No other resources were requested/provided at this time. BSW provided pt direct dial should she need to get in contact with him before 2 week f/u. Pt understood and confirmed she wrote number down correctly.    Goals Addressed             This Visit's Progress    BSW VBCI Social Work Care Plan   On track    Current SDOH Barriers:  Financial strain and utility disconnection notice.  Interventions: Referred  patient to community resources  Provided patient with information about local utility assistance programs in [place] via email. Advised patient to call DSS (crisis intervention program), [employer], and [employer] about their financial assistance programs for [place] with disconnection.            Recommendation:   attend all scheduled provider appointments Follow up with local utility assistance programs about assistance availability.   Follow Up Plan:   Telephone follow up appointment date/time:  04/23/2024 at 11:30AM.   Karen Brennan, BSW [place]/VBCI - [facility] Social Worker [phone]

## 2024-04-08 NOTE — Patient Instructions (Signed)
 Visit Information  Karen Brennan was given information about Medicaid Managed Care team care coordination services as a part of their [facility] Community Plan Medicaid benefit.   If you would like to schedule transportation through your [facility], please call the following number at least 2 days in advance of your appointment: [phone]   Rides for urgent appointments can also be made after hours by calling Member Services.  Call the Behavioral Health Crisis Line at [phone], at any time, 24 hours a day, 7 days a week. If you are in danger or need immediate medical attention call 911.  Please see education materials related to utility assistance resources provided by email.   Care plan and visit instructions communicated with the patient verbally today. Patient agrees to receive a copy in MyChart. Active MyChart status and patient understanding of how to access instructions and care plan via MyChart confirmed with patient.     Telephone follow up appointment with Managed Medicaid care management team member scheduled for: 11/12/205 at 11:30AM.  [name], BSW [place]/VBCI - [facility] Social Worker [phone]   Following is a copy of your plan of care:  There are no care plans that you recently modified to display for this patient.

## 2024-04-09 DIAGNOSIS — G5793 Unspecified mononeuropathy of bilateral lower limbs: Secondary | ICD-10-CM | POA: Diagnosis not present

## 2024-04-11 ENCOUNTER — Other Ambulatory Visit: Payer: Self-pay

## 2024-04-11 ENCOUNTER — Encounter: Payer: Self-pay | Admitting: Family Medicine

## 2024-04-11 ENCOUNTER — Other Ambulatory Visit: Payer: Self-pay | Admitting: *Deleted

## 2024-04-11 DIAGNOSIS — G5793 Unspecified mononeuropathy of bilateral lower limbs: Secondary | ICD-10-CM | POA: Diagnosis not present

## 2024-04-11 NOTE — Patient Instructions (Signed)
 Visit Information  Ms. Karen Brennan was given information about Medicaid Managed Care team care coordination services as a part of their [facility] Community Plan Medicaid benefit.   If you would like to schedule transportation through your [facility], please call the following number at least 2 days in advance of your appointment: [phone]   Rides for urgent appointments can also be made after hours by calling Member Services.  Call the Behavioral Health Crisis Line at [phone], at any time, 24 hours a day, 7 days a week. If you are in danger or need immediate medical attention call 911.  Please see education materials related to Diabetes and HTN provided by MyChart link.  Care plan and visit instructions communicated with the patient verbally today. Patient agrees to receive a copy in MyChart. Active MyChart status and patient understanding of how to access instructions and care plan via MyChart confirmed with patient.     Telephone follow up appointment with Managed Medicaid care management team member scheduled for: 05/16/24 @ 11 am.   Rhyder Koegel, RN, BSN, ACM RN Care Manager [facility] [phone]   Following is a copy of your plan of care:  There are no care plans that you recently modified to display for this patient.  Visit Information  Ms. Karen Brennan was given information about Medicaid Managed Care team care coordination services as a part of their [facility] Community Plan Medicaid benefit.   If you would like to schedule transportation through your [facility], please call the following number at least 2 days in advance of your appointment: [phone]   Rides for urgent appointments can also be made after hours by calling Member Services.  Call the Behavioral Health Crisis Line at [phone], at any time, 24 hours a day, 7 days a week. If you are in danger or need immediate medical attention call  911.  Please see education materials related to Diabetes and HTN provided by MyChart link.  Care plan and visit instructions communicated with the patient verbally today. Patient agrees to receive a copy in MyChart. Active MyChart status and patient understanding of how to access instructions and care plan via MyChart confirmed with patient.     Telephone follow up appointment with Managed Medicaid care management team member scheduled for: 05/16/24 @ 11 am  Tiberius Loftus, CHARITY FUNDRAISER, SCIENTIST, RESEARCH (PHYSICAL SCIENCES), Theatre Manager [employer] [phone]

## 2024-04-11 NOTE — Patient Outreach (Signed)
 Complex Care Management   Visit Note  04/11/2024  Name:  Karen KRUCKENBERG MRN: [id number] DOB: Oct 15, 1975  Situation: Referral received for Complex Care Management related to HTN I obtained verbal consent from Patient.  Visit completed with Patient  on the phone  Background:   Past Medical History:  Diagnosis Date   ANEMIA 08/14/2008   Qualifier: Diagnosis of  By: Joyice MD, Jessica     Anxiety    Asthma    B12 deficiency 08/29/2021   Bipolar 1 disorder (HCC)    Blood transfusion    Cellulitis 07/15/2020   Of right buttock   Chest pain    Depression    Edema of both lower extremities    GERD (gastroesophageal reflux disease)    History of bronchitis    Hypertension    Kidney stones 10/09/2011   Migraines 10/09/2011   often   Morbid obesity with BMI of 50.0-59.9, adult (HCC) 08/09/2006   Qualifier: Diagnosis of   By: Joyice MD, Jessica         Pre-diabetes    Schizophrenia (HCC)    SOB (shortness of breath)    Vitamin D  deficiency 08/29/2021    Assessment: Patient Reported Symptoms:  Cognitive Cognitive Status: Able to follow simple commands, Alert and oriented to person, place, and time, Insightful and able to interpret abstract concepts, Normal speech and language skills Cognitive/Intellectual Conditions Management [RPT]: None reported or documented in medical history or problem list   Health Maintenance Behaviors: Annual physical exam, Healthy diet, Stress management, Spiritual practice(s), Sleep adequate Healing Pattern: Average Health Facilitated by: Healthy diet, Prayer/meditation, Stress management  Neurological Neurological Review of Symptoms: Dizziness, Headaches Neurological Management Strategies: Adequate rest, Coping strategies, Routine screening, Medication therapy Neurological Self-Management Outcome: 3 (uncertain) Neurological Comment: Patient reports that she takes Topomas for headaches.  HEENT HEENT Symptoms Reported: Nasal discharge HEENT  Management Strategies: Adequate rest, Routine screening HEENT Self-Management Outcome: 3 (uncertain) HEENT Comment: Patient reports that she is taking flonase  for the nasal drainage.    Cardiovascular Cardiovascular Symptoms Reported: Swelling in legs or feet Does patient have uncontrolled Hypertension?: No Cardiovascular Management Strategies: Adequate rest, Routine screening, Medication therapy Do You Have a Working Readable Scale?: Yes Cardiovascular Self-Management Outcome: 3 (uncertain) Cardiovascular Comment: Patient reports that her weight yesterday was 324lbs.  She she is taking Omezipic to help with weight loss.  Reports that food does not taste the same since she has been on Omezipic.  She is going to report to provider.  Reports swelling her feet due to plantar fascilits.  Respiratory Respiratory Symptoms Reported: Shortness of breath Other Respiratory Symptoms: Reports occassional shortness of breath whe she ambulates.  She reports that she is using albuterol  inhaler as needed. Respiratory Management Strategies: Adequate rest, Medication therapy, Routine screening Respiratory Self-Management Outcome: 3 (uncertain)  Endocrine Endocrine Symptoms Reported: Hypoglycemia Is patient diabetic?: Yes Is patient checking blood sugars at home?: Yes List most recent blood sugar readings, include date and time of day: Reports that blood sugars have dropped at times.  She informs me that a few days ago blood sugar was 64.  She reports that she feels like she is not eating enough.  She informs me that she is taking Ozempic  and since starting medication food does not take right to her.  Discussed hypoglycemia episode management.  Informed patient to eat fast-acting carboyhydrate( 15-20 grams)- Glucose tables or gel 4 ounces( 120 ml) fruit juice or regular soda,  1 tablespoon sugar or honey, wait 15  minutes, then recheck blood glucose.  If still low, repeat carbohydrate intake once gluocse normal, follow  with a longer-acting carbohydate( sandwichm or snack with protein) Endocrine Self-Management Outcome: 3 (uncertain)  Gastrointestinal Gastrointestinal Symptoms Reported: Constipation Additional Gastrointestinal Details: Patient reports occassional constipation.  Patient reports that she takes Linzess  and Miralax  as needed. Gastrointestinal Management Strategies: Adequate rest, Activity Gastrointestinal Self-Management Outcome: 4 (good)    Genitourinary Genitourinary Symptoms Reported: Other Other Genitourinary Symptoms: Patient reports that she has just completed medications for UTI.  She informes me that she continues to have back pain due to UTI. Genitourinary Management Strategies: Adequate rest, Medication therapy Genitourinary Self-Management Outcome: 3 (uncertain)  Integumentary Integumentary Symptoms Reported: Other Other Integumentary Symptoms: Patient continues to have dry skin.  Patient reports that she uses Eucernin and Cerva to help moisturizes dry skin. Skin Management Strategies: Adequate rest, Activity, Routine screening, Coping strategies Skin Self-Management Outcome: 3 (uncertain)  Musculoskeletal Musculoskelatal Symptoms Reviewed: Weakness, Back pain Other Musculoskeletal Symptoms: Patient reports that she continues to have back pain due to UTI.  She reports that she is taking Tylenol  and Motrin  to help with pain.  Patient rates pain leve a 6/10 on pain scale. Musculoskeletal Management Strategies: Adequate rest, Coping strategies, Medication therapy, Routine screening Musculoskeletal Self-Management Outcome: 3 (uncertain) Falls in the past year?: Yes Number of falls in past year: 1 or less (Patient reports no falls since last outreach.) Was there an injury with Fall?: No Fall Risk Category Calculator: 1 Patient Fall Risk Level: Low Fall Risk Patient at Risk for Falls Due to: History of fall(s) Fall risk Follow up: Falls evaluation completed, Education provided, Falls  prevention discussed  Psychosocial Psychosocial Symptoms Reported: Depression - if selected complete PHQ 2-9, Anxiety - if selected complete GAD Additional Psychological Details: Patient reports that she takes Prozac, Zyprexa, Hydroxyine to help with Anxiety and Depression.  LCSW following for couseling services and Mental health resources. Behavioral Management Strategies: Coping strategies, Activity, Counseling, Support system, Medication therapy, Adequate rest Behavioral Health Self-Management Outcome: 3 (uncertain) Major Change/Loss/Stressor/Fears (CP): Medical condition, self Techniques to Cope with Loss/Stress/Change: Counseling, Medication, Spiritual practice(s) Quality of Family Relationships: helpful, involved, supportive Do you feel physically threatened by others?: No    04/11/2024    PHQ2-9 Depression Screening   Little interest or pleasure in doing things More than half the days  Feeling down, depressed, or hopeless More than half the days  PHQ-2 - Total Score 4  Trouble falling or staying asleep, or sleeping too much Nearly every day  Feeling tired or having little energy More than half the days  Poor appetite or overeating  More than half the days  Feeling bad about yourself - or that you are a failure or have let yourself or your family down More than half the days  Trouble concentrating on things, such as reading the newspaper or watching television    Moving or speaking so slowly that other people could have noticed.  Or the opposite - being so fidgety or restless that you have been moving around a lot more than usual Several days  Thoughts that you would be better off dead, or hurting yourself in some way Not at all  PHQ2-9 Total Score 14  If you checked off any problems, how difficult have these problems made it for you to do your work, take care of things at home, or get along with other people    Depression Interventions/Treatment Medication, Currently on Treatment,  Counseling    There  were no vitals filed for this visit.  Medications Reviewed Today     Reviewed by Jorja Nichole LABOR, RN (Case Manager) on 04/11/24 at 1210  Med List Status: <None>   Medication Order Taking? Sig Documenting Provider Last Dose Status Informant  Accu-Chek Softclix Lancets lancets [id number] Yes USE TO test fasting blood sugar EVERY DAY IN THE MORNING Everhart, Kirstie, DO  Active   albuterol  (VENTOLIN  HFA) 108 (90 Base) MCG/ACT inhaler [id number] Yes inhale 2 PUFFS BY MOUTH every 4 hours as needed FOR wheezing OR shortness of breath Hindel, Leah, MD  Active   ARIPiprazole  (ABILIFY ) 20 MG tablet [id number]  Take 20 mg by mouth at bedtime as needed.  Patient not taking: Reported on 04/11/2024   [provider]  Active   atorvastatin  (LIPITOR) 20 MG tablet [id number] Yes Take 1 tablet (20 mg total) by mouth daily. Everhart, Kirstie, DO  Active   B Complex-C-Folic Acid  (B COMPLEX-VITAMIN C-FOLIC ACID ) 1 MG tablet [id number] Yes Take 1 tablet by mouth daily with breakfast. Midge Sober, DO  Active   benztropine (COGENTIN) 1 MG tablet [id number] Yes Take 1 mg by mouth 2 (two) times daily. [provider]  Active Self  benztropine (COGENTIN) 2 MG tablet [id number] Yes Take 2 mg by mouth 2 (two) times daily. [provider]  Active   blood glucose meter kit and supplies KIT [id number] Yes Dispense based on patient and insurance preference. Use up to four times daily as directed. (FOR ICD-9 250.00, 250.01). Everhart, Kirstie, DO  Active   Blood Glucose Monitoring Suppl (ACCU-CHEK GUIDE) w/Device KIT [id number] Yes Use to check blood sugar 3x per day. E11.9 Everhart, Kirstie, DO  Active   busPIRone (BUSPAR) 7.5 MG tablet [id number]  Take 7.5 mg by mouth 2 (two) times daily.  Patient not taking: Reported on 04/11/2024   [provider]  Active   Cyanocobalamin  1000 MCG CAPS [id number] Yes Take 1 tablet by mouth daily. [provider]  Active    ferrous sulfate  325 (65 FE) MG tablet [id number] Yes Take 325 mg by mouth in the morning and at bedtime. [provider]  Active   fluconazole  (DIFLUCAN ) 150 MG tablet [id number]  Take 1 tablet (150 mg total) by mouth daily. If symptoms persist take second dose of Diflucan  150 mg on day 3 to treat vaginal Candida.  Patient not taking: Reported on 04/11/2024   Reddick, Johnathan B, NP  Active   FLUoxetine (PROZAC) 40 MG capsule [id number] Yes Take 40 mg by mouth daily. [provider]  Active Self  fluticasone  (FLONASE ) 50 MCG/ACT nasal spray [id number] Yes Place 2 sprays into both nostrils daily. Shake well before use. Gently blow nose before spraying. Do not blow nose immediately after use. You should not taste the medication or feel it going down your throat; if you do, adjust your technique. Adele Song, MD  Active   glucose blood (ACCU-CHEK GUIDE TEST) test strip [id number]  Use to check blood sugar 3x per day. E11.9 Everhart, Kirstie, DO  Active   hydrOXYzine  (VISTARIL ) 25 MG capsule [id number] Yes Take 25 mg by mouth every evening. Every evening as needed [provider]  Active   linaclotide  (LINZESS ) 72 MCG capsule [id number] Yes Take 1 capsule (72 mcg total) by mouth daily before breakfast. Midge Sober, DO  Active   meclizine  (ANTIVERT ) 12.5 MG tablet [id number]  Take 1 tablet (12.5 mg total) by mouth 3 (three) times daily as needed for dizziness.  Patient not taking: Reported  on 04/11/2024   Bryan Bianchi, MD  Active   metFORMIN  (GLUCOPHAGE ) 500 MG tablet [id number] Yes Take 0.5 tablets (250 mg total) by mouth 2 (two) times daily with a meal. Opalski, Deborah, DO  Active   metoprolol  tartrate (LOPRESSOR ) 25 MG tablet [id number]  TAKE ONE-HALF TABLET BY MOUTH TWICE DAILY AS NEEDED  Patient not taking: Reported on 04/11/2024   Loistine Sober, NP  Active   nystatin  (MYCOSTATIN ) 100000 UNIT/ML suspension [id number] Yes Take 5 mLs (500,000 Units total) by mouth 4  (four) times daily. Burnette, Jennifer M, PA-C  Active   nystatin  (MYCOSTATIN /NYSTOP ) powder [id number] Yes Apply 1 Application topically 3 (three) times daily. Adele Song, MD  Active   OLANZapine (ZYPREXA) 20 MG tablet [id number] Yes Take 20 mg by mouth at bedtime. [provider]  Active   phenazopyridine  (PYRIDIUM ) 200 MG tablet [id number] Yes Take 1 tablet (200 mg total) by mouth 3 (three) times daily. Romelle Booty, MD  Active   polyethylene glycol powder (MIRALAX ) 17 GM/SCOOP powder [id number] Yes Take 17 g by mouth daily. Dissolve 1 capful (17g) in 4-8 ounces of liquid and take by mouth daily. Vivienne Nest M, PA-C  Active   pregabalin  (LYRICA ) 100 MG capsule [id number] Yes Take 1 capsule (100 mg total) by mouth 3 (three) times daily. Christine Rush, DPM  Active   Prenatal Vit-Fe Fum-FA-Omega (PNV PRENATAL PLUS MULTIVIT+DHA) 27-1 & 312 MG MISC [id number] Yes 1 po qd Midge Sober, DO  Active   Semaglutide , 1 MG/DOSE, 4 MG/3ML SOPN [id number] Yes Inject 1 mg as directed once a week. Midge Sober, DO  Active   topiramate  (TOPAMAX ) 25 MG tablet [id number] Yes Take 1 tablet (25 mg total) by mouth 2 (two) times daily. Midge Sober, DO  Active   traZODone (DESYREL) 50 MG tablet [id number] Yes Take 50 mg by mouth at bedtime as needed for sleep. [provider]  Active Self  valsartan  (DIOVAN ) 40 MG tablet [id number]  TAKE 1 Tablet BY MOUTH ONCE DAILY at bedtime  Patient not taking: Reported on 04/11/2024   Hindel, Song, MD  Active   Vitamin D , Ergocalciferol , (DRISDOL ) 1.25 MG (50000 UNIT) CAPS capsule [id number] Yes 1 tab q wed and 1 tab q sun Midge Sober, DO  Active             Recommendation:   PCP Follow-up Continue Current Plan of Care  Follow Up Plan:   Telephone follow-up in 1 month: 05/16/24 @ 11 am.   Tarnisha Kachmar, RN, BSN, ACM RN Care Manager [employer] [phone]

## 2024-04-14 ENCOUNTER — Other Ambulatory Visit: Payer: Self-pay | Admitting: Family Medicine

## 2024-04-14 ENCOUNTER — Other Ambulatory Visit (INDEPENDENT_AMBULATORY_CARE_PROVIDER_SITE_OTHER): Payer: Self-pay | Admitting: Family Medicine

## 2024-04-14 DIAGNOSIS — G5793 Unspecified mononeuropathy of bilateral lower limbs: Secondary | ICD-10-CM | POA: Diagnosis not present

## 2024-04-14 DIAGNOSIS — D508 Other iron deficiency anemias: Secondary | ICD-10-CM

## 2024-04-14 DIAGNOSIS — J302 Other seasonal allergic rhinitis: Secondary | ICD-10-CM

## 2024-04-15 ENCOUNTER — Ambulatory Visit: Admitting: Family Medicine

## 2024-04-16 DIAGNOSIS — G5793 Unspecified mononeuropathy of bilateral lower limbs: Secondary | ICD-10-CM | POA: Diagnosis not present

## 2024-04-17 ENCOUNTER — Other Ambulatory Visit (INDEPENDENT_AMBULATORY_CARE_PROVIDER_SITE_OTHER): Payer: Self-pay | Admitting: Family Medicine

## 2024-04-17 DIAGNOSIS — E669 Obesity, unspecified: Secondary | ICD-10-CM

## 2024-04-18 ENCOUNTER — Ambulatory Visit (INDEPENDENT_AMBULATORY_CARE_PROVIDER_SITE_OTHER): Admitting: Family Medicine

## 2024-04-18 ENCOUNTER — Encounter: Payer: Self-pay | Admitting: Family Medicine

## 2024-04-18 VITALS — BP 98/63 | HR 100 | Ht 73.0 in | Wt 336.8 lb

## 2024-04-18 DIAGNOSIS — N390 Urinary tract infection, site not specified: Secondary | ICD-10-CM | POA: Diagnosis not present

## 2024-04-18 DIAGNOSIS — N3000 Acute cystitis without hematuria: Secondary | ICD-10-CM | POA: Diagnosis present

## 2024-04-18 DIAGNOSIS — G5793 Unspecified mononeuropathy of bilateral lower limbs: Secondary | ICD-10-CM | POA: Diagnosis not present

## 2024-04-18 DIAGNOSIS — R0602 Shortness of breath: Secondary | ICD-10-CM | POA: Diagnosis not present

## 2024-04-18 DIAGNOSIS — I10 Essential (primary) hypertension: Secondary | ICD-10-CM | POA: Diagnosis not present

## 2024-04-18 LAB — POCT UA - MICROSCOPIC ONLY
Epithelial cells, urine per micros: >20
RBC, Urine, Miroscopic: NONE SEEN (ref 0–2)

## 2024-04-18 LAB — POCT URINALYSIS DIP (MANUAL ENTRY)
Bilirubin, UA: NEGATIVE
Blood, UA: NEGATIVE
Glucose, UA: NEGATIVE mg/dL
Nitrite, UA: POSITIVE — AB
Protein Ur, POC: 30 mg/dL — AB
Spec Grav, UA: 1.02 (ref 1.010–1.025)
Urobilinogen, UA: 2 U/dL — AB
pH, UA: 6.5 (ref 5.0–8.0)

## 2024-04-18 MED ORDER — CIPROFLOXACIN HCL 500 MG PO TABS: 500.0000 mg | ORAL_TABLET | Freq: Two times a day (BID) | ORAL | 0 refills | Status: AC

## 2024-04-18 MED ORDER — PHENAZOPYRIDINE HCL 200 MG PO TABS: 200.0000 mg | ORAL_TABLET | Freq: Three times a day (TID) | ORAL | 0 refills | Status: DC

## 2024-04-18 NOTE — Assessment & Plan Note (Signed)
 Not taking Valsartan  due to lower blood pressures and report of dizziness.  Blood pressure well-controlled today, advised continuing to hold off on valsartan  for now.  Advised close follow-up visit to discuss this further in 1 to 2 weeks.

## 2024-04-18 NOTE — Progress Notes (Signed)
    SUBJECTIVE:   CHIEF COMPLAINT / HPI:   Recurrent UTI 4 episodes in past 3 months, mostly E coli + one Klebsiella, pansensitive cultures Most recently diagnosed 10/17, tx with Keflex  500mg  BID x 7 days Urology referral has been sent and authorized, pt called and could not get in touch with them to schedule appt [facility] [phone]) Current sx: Abd pain, back pain, urinary odor, frequency No new sexual partners No vaginal discharge  Albuterol  inhaler --doesn't recall PFTs done in the past --uses inhaler 1-2 per month  Med rec Thinks she is taking abilify  and not zyprexa  Stopped taking valsartan  due to lower BP readings and dizziness. Requested meclizine  to help with dizziness  PERTINENT  PMH / PSH: HTN, asthma, hidradenitis, GERD, type 2 diabetes, HLD, bipolar 1, obesity  OBJECTIVE:   BP 98/63   Pulse 100   Ht 6' 1 (1.854 m)   Wt (!) 336 lb 12.8 oz (152.8 kg)   SpO2 100%   BMI 44.44 kg/m   General: Awake and conversant, no acute distress Pulm: normal work of breathing on room air Abd: normal BS, soft, nondistended, mild TTP at lower abd MSK: no CVA tenderness BL Neuro: No focal deficits Psych: Appropriate mood and affect   ASSESSMENT/PLAN:   Assessment & Plan Recurrent UTI Acute cystitis without hematuria Patient with another episode of acute cystitis today, sending urine culture.  Recently completed 7-day course of Keflex .  Current infection may represent recurrence versus colonization.  Will treat with 7 days ciprofloxacin 500 mg will also obtain CT abdomen pelvis to ensure no urinary tract abnormalities. Refilled pyridium  as pt stated this helped last time.  Referral made to urology however patient has not heard from them.  Will attempt to reach out myself to help patient schedule an appointment. - Urine Culture - CT ABDOMEN PELVIS W WO CONTRAST; Future - ciprofloxacin (CIPRO) 500 MG tablet; Take 1 tablet (500 mg total) by mouth 2 (two) times daily  for 7 days.  Dispense: 14 tablet; Refill: 0 - phenazopyridine  (PYRIDIUM ) 200 MG tablet; Take 1 tablet (200 mg total) by mouth 3 (three) times daily.  Dispense: 6 tablet; Refill: 0  Shortness of breath Patient using albuterol  inhaler a few times a month for shortness of breath.  Placed referral to pharmacy team for PFT evaluation. Hypertension, unspecified type Not taking Valsartan  due to lower blood pressures and report of dizziness.  Blood pressure well-controlled today, advised continuing to hold off on valsartan  for now.  Advised close follow-up visit to discuss this further in 1 to 2 weeks.     Rea Raring, MD [facility]

## 2024-04-18 NOTE — Patient Instructions (Addendum)
 Thank you for coming in today! Here is a summary of what we discussed:  -I ordered ciprofloxacin for your UTI. You will take this twice a day for 1 week  -I also order a CT scan of your abdomen and pelvis so we can make sure there's no issues with your urinary tract  -I will try to reach out to urology to see about getting you scheduled  -Please make a follow up appt in 1-2 weeks to discuss blood pressure/dizziness   We are checking some labs today. If they are abnormal, I will call you. If they are normal, I will send you a MyChart message (if it is active) or a letter in the mail. If you do not hear about your labs in the next 2 weeks, please call the office.   Please call the clinic at [phone] if your symptoms worsen or you have any concerns.  Best, Dr Adele

## 2024-04-21 ENCOUNTER — Telehealth: Payer: Self-pay | Admitting: Pharmacist

## 2024-04-21 DIAGNOSIS — G5793 Unspecified mononeuropathy of bilateral lower limbs: Secondary | ICD-10-CM | POA: Diagnosis not present

## 2024-04-21 NOTE — Telephone Encounter (Signed)
 Patient contacted for follow-up - need to schedule PFT Contact conducted by Recardo Purdue, PharmD candidate  Scheduled for PFT on 11/21  Total time with patient call and documentation of interaction: 6 minutes.

## 2024-04-22 ENCOUNTER — Ambulatory Visit

## 2024-04-22 ENCOUNTER — Ambulatory Visit: Admitting: Student

## 2024-04-22 ENCOUNTER — Ambulatory Visit (INDEPENDENT_AMBULATORY_CARE_PROVIDER_SITE_OTHER): Payer: Self-pay | Admitting: Family Medicine

## 2024-04-23 ENCOUNTER — Other Ambulatory Visit: Payer: Self-pay | Admitting: Licensed Clinical Social Worker

## 2024-04-23 ENCOUNTER — Other Ambulatory Visit: Payer: Self-pay

## 2024-04-23 DIAGNOSIS — G5793 Unspecified mononeuropathy of bilateral lower limbs: Secondary | ICD-10-CM | POA: Diagnosis not present

## 2024-04-23 NOTE — Patient Instructions (Signed)
 Visit Information  Ms. [place] was given information about Medicaid Managed Care team care coordination services as a part of their [facility] Community Plan Medicaid benefit.   If you would like to schedule transportation through your [facility], please call the following number at least 2 days in advance of your appointment: [phone]   Rides for urgent appointments can also be made after hours by calling Member Services.  Call the Behavioral Health Crisis Line at [phone], at any time, 24 hours a day, 7 days a week. If you are in danger or need immediate medical attention call 911.  Please see education materials related to food resources and utiility resources provided by mail  Patient verbalizes understanding of instructions and care plan provided today and agrees to view in [place]. Active MyChart status and patient understanding of how to access instructions and care plan via MyChart confirmed with patient.     Telephone follow up appointment with Managed Medicaid care management team member scheduled for: 11/26/205 at 11:30AM.  [name], BSW [place]/VBCI - [facility] Social Worker [phone]   Following is a copy of your plan of care:  There are no care plans that you recently modified to display for this patient.

## 2024-04-23 NOTE — Patient Instructions (Signed)
 Visit Information  Thank you for taking time to visit with me today. Please don't hesitate to contact me if I can be of assistance to you before our next scheduled appointment.  Our next appointment is by telephone on 06/02/24 at 2:30 PM   Please call the care guide team at [phone] if you need to cancel or reschedule your appointment.   Following is a copy of your care plan:   Goals Addressed             This Visit's Progress    VBCI Social Work Care Plan       Problems:   Biomedical scientist            Food needs            Difficulty paying for medications            Anxiety           Depression   CSW Clinical Goal(s):   Over the next 30 days the Patient will attend all scheduled medical appointments as evidenced by patient report and care team review of appointment completion in electronic medical record .           Over next 30 days Karen Brennan will attend scheduled mental health appointments  at [facility] with counselor and psychiatrist AEB patient report of attending scheduled mental health appointments  Interventions:  Spoke with client about her current needs and status               Spoke about home environment. She lives at home with her 3 children.  One daughter has Down's Syndrome                 Spoke about transport needs; spoke about medication procurement. She said that sometimes it is hard to pay for her medications                Discussed financial needs.  She said she goes to [employer] occasionally for food help. She also receives Food stamps benefit               Discussed support from PCP              Discussed support from cardiologist             Reviewed pain issues. She spoke left leg pain, back pain and history of UTIs             Discussed ambulation. She get SOB sometimes when walking. She spoke of left leg pain. She said she has some swelling in her feet.               Completed assessments as needed.  Completed GAD-7; Completed  PHQ 2/9                Discussed mood of client . She goes to [facility] for mental health support in [place], [place].  She said she has been going to [facility] for a number of years.  She said she is taking medications as prescribed.  She feels that sessions at [facility] are helpful to her.                Discussed program support with RN, LCSW, Pharmacist                 Discussed support with RN Nichole Jobs                 Discussed SW support  with BSW, German, who is scheduled to call client later today                   Encouraged Karen Brennan to call LCSW as needed at [phone]                  Client was appreciative of call today from LCSW                  Patient Goals/Self-Care Activities:  Attend scheduled PCP appointments            Attend scheduled mental health appointments at [facility] in [place], [place]             Take medications as prescribed               Contact local food pantries as needed for extra food support            Communicate as needed with RN Nichole Jobs for nursing support            Call LCSW as needed for SW support  Plan:   Telephone follow up appointment with care management team member scheduled for:  06/02/24 at 2:30 PM        Please go to [facility] [address], [place] [phone]) if you are experiencing a Mental Health or Behavioral Health Crisis or need someone to talk to.  Patient verbalized understanding of Care plan and visit instructions communicated this visit   Karen Brennan  MSW, LCSW /Value Based Care [facility] Licensed Clinical Social Worker Direct Dial:  [phone] Fax:  [phone] Website:  [email or web address]

## 2024-04-23 NOTE — Patient Outreach (Signed)
 Complex Care Management   Visit Note  04/23/2024  Name:  Karen Brennan MRN: [id number] DOB: Jun 14, 1975  Situation: Referral received for Complex Care Management related to mental health needs I obtained verbal consent from Patient.  Visit completed with Patient  on the phone  Background:   Past Medical History:  Diagnosis Date   ANEMIA 08/14/2008   Qualifier: Diagnosis of  By: Joyice MD, Jessica     Anxiety    Asthma    B12 deficiency 08/29/2021   Bipolar 1 disorder (HCC)    Blood transfusion    Cellulitis 07/15/2020   Of right buttock   Chest pain    Depression    Edema of both lower extremities    GERD (gastroesophageal reflux disease)    History of bronchitis    Hypertension    Kidney stones 10/09/2011   Migraines 10/09/2011   often   Morbid obesity with BMI of 50.0-59.9, adult (HCC) 08/09/2006   Qualifier: Diagnosis of   By: Joyice MD, Jessica         Pre-diabetes    Schizophrenia (HCC)    SOB (shortness of breath)    Vitamin D  deficiency 08/29/2021    Assessment: Patient Reported Symptoms:  Cognitive Cognitive Status: Able to follow simple commands, Alert and oriented to person, place, and time Cognitive/Intellectual Conditions Management [RPT]: None reported or documented in medical history or problem list   Health Maintenance Behaviors: Annual physical exam, Stress management Health Facilitated by: Stress management  Neurological Neurological Review of Symptoms: Dizziness, Headaches Neurological Management Strategies: Adequate rest, Coping strategies  HEENT HEENT Symptoms Reported: Nasal discharge HEENT Management Strategies: Adequate rest, Routine screening    Cardiovascular Cardiovascular Symptoms Reported: Lightheadness, Dizziness, Fatigue Does patient have uncontrolled Hypertension?: No Cardiovascular Management Strategies: Adequate rest, Coping strategies  Respiratory Respiratory Symptoms Reported: Shortness of breath Additional Respiratory  Details: uses inhaler as needed Respiratory Management Strategies: Adequate rest, Coping strategies  Endocrine Endocrine Symptoms Reported: Weakness or fatigue, Shakiness, Shortness of breath, Headaches, Blurry vision (may need to see eye doctor per patient information) Is patient diabetic?: Yes Is patient checking blood sugars at home?: Yes    Gastrointestinal Gastrointestinal Symptoms Reported: Constipation Gastrointestinal Management Strategies: Adequate rest, Coping strategies    Genitourinary Genitourinary Symptoms Reported: Frequency Other Genitourinary Symptoms: hx of UTIs Genitourinary Management Strategies: Coping strategies  Integumentary Integumentary Symptoms Reported: Skin changes Other Integumentary Symptoms: dry skin Skin Management Strategies: Adequate rest, Coping strategies  Musculoskeletal Musculoskelatal Symptoms Reviewed: Unsteady gait, Weakness, Difficulty walking, Limited mobility, Muscle pain, Back pain Musculoskeletal Management Strategies: Adequate rest, Coping strategies      Psychosocial Psychosocial Symptoms Reported: Sadness - if selected complete PHQ 2-9, Anxiety - if selected complete GAD, Depression - if selected complete PHQ 2-9 Additional Psychological Details: One daughter helps her often.  She has some help from other children Takes medications as prescribed Behavioral Management Strategies: Coping strategies, Adequate rest Major Change/Loss/Stressor/Fears (CP): Medical condition, self Techniques to Cope with Loss/Stress/Change: Counseling, Medication, Support group Quality of Family Relationships: supportive Do you feel physically threatened by others?: No    04/23/2024    PHQ2-9 Depression Screening   Little interest or pleasure in doing things More than half the days  Feeling down, depressed, or hopeless More than half the days  PHQ-2 - Total Score 4  Trouble falling or staying asleep, or sleeping too much More than half the days  Feeling  tired or having little energy Several days  Poor appetite or overeating  Several  days  Feeling bad about yourself - or that you are a failure or have let yourself or your family down Several days  Trouble concentrating on things, such as reading the newspaper or watching television Several days  Moving or speaking so slowly that other people could have noticed.  Or the opposite - being so fidgety or restless that you have been moving around a lot more than usual Several days  Thoughts that you would be better off dead, or hurting yourself in some way Not at all  PHQ2-9 Total Score 11  If you checked off any problems, how difficult have these problems made it for you to do your work, take care of things at home, or get along with other people Somewhat difficult  Depression Interventions/Treatment Medication, Counseling, Currently on Treatment    Vitals:  BP is in normal range per client information  Pain Scale: 0-10 Pain Score: 6  Pain Type: Chronic pain Pain Location: Generalized Pain Descriptors / Indicators: Aching Pain Onset: On-going Patients Stated Pain Goal: 1 Pain Intervention(s): Relaxation  Medications Reviewed Today     Reviewed by Frances Ozell RAMAN, LCSW (Social Worker) on 04/23/24 at [id number]  Med List Status: <None>   Medication Order Taking? Sig Documenting Provider Last Dose Status Informant  Accu-Chek Softclix Lancets lancets [id number] Not taking USE TO test fasting blood sugar EVERY DAY IN THE MORNING  Patient not taking: Reported on 04/23/2024   Everhart, Kirstie, DO  Active   albuterol  (VENTOLIN  HFA) 108 (90 Base) MCG/ACT inhaler [id number] Yes inhale 2 PUFFS BY MOUTH every 4 hours as needed FOR wheezing OR shortness of breath Hindel, Leah, MD  Active   ARIPiprazole  (ABILIFY ) 20 MG tablet [id number] Yes Take 20 mg by mouth at bedtime as needed. [provider]  Active   atorvastatin  (LIPITOR) 20 MG tablet [id number] Yes Take 1 tablet (20 mg total) by mouth  daily. Everhart, Kirstie, DO  Active   B Complex-C-Folic Acid  (B COMPLEX-VITAMIN C-FOLIC ACID ) 1 MG tablet [id number] Yes Take 1 tablet by mouth daily with breakfast. Midge Sober, DO  Active   benztropine (COGENTIN) 1 MG tablet [id number] Not taking Take 1 mg by mouth 2 (two) times daily.  Patient not taking: Reported on 04/23/2024   [provider]  Active Self  benztropine (COGENTIN) 2 MG tablet [id number] Yes Take 2 mg by mouth 2 (two) times daily. [provider]  Active   blood glucose meter kit and supplies KIT [id number] Not taking Dispense based on patient and insurance preference. Use up to four times daily as directed. (FOR ICD-9 250.00, 250.01).  Patient not taking: Reported on 04/23/2024   Everhart, Kirstie, DO  Active   Blood Glucose Monitoring Suppl (ACCU-CHEK GUIDE) w/Device KIT [id number] Not taking Use to check blood sugar 3x per day. E11.9  Patient not taking: Reported on 04/23/2024   Everhart, Kirstie, DO  Active   busPIRone (BUSPAR) 7.5 MG tablet [id number] Yes Take 7.5 mg by mouth 2 (two) times daily. [provider]  Active   ciprofloxacin (CIPRO) 500 MG tablet [id number] Yes Take 1 tablet (500 mg total) by mouth 2 (two) times daily for 7 days. Adele Song, MD  Active   Cyanocobalamin  1000 MCG CAPS [id number] unknown Take 1 tablet by mouth daily. [provider]  Active   ferrous sulfate  325 (65 FE) MG tablet [id number] Yes Take 325 mg by mouth in the morning and at bedtime. [provider]  Active   FLUoxetine (PROZAC) 40 MG  capsule [id number] Yes Take 40 mg by mouth daily. [provider]  Active Self  fluticasone  (FLONASE ) 50 MCG/ACT nasal spray [id number] Yes Place 2 sprays into both nostrils daily. Shake well before use. Gently blow nose before spraying. Do not blow nose immediately after use. You should not taste the medication or feel it going down your throat; if you do, adjust your technique. Adele Song, MD  Active    glucose blood (ACCU-CHEK GUIDE TEST) test strip [id number] unknown Use to check blood sugar 3x per day. E11.9 Everhart, Kirstie, DO  Active   hydrOXYzine  (VISTARIL ) 25 MG capsule [id number] Yes Take 25 mg by mouth every evening. Every evening as needed [provider]  Active   linaclotide  (LINZESS ) 72 MCG capsule [id number] Yes Take 1 capsule (72 mcg total) by mouth daily before breakfast. Midge Sober, DO  Active   metFORMIN  (GLUCOPHAGE ) 500 MG tablet [id number] Yes Take 0.5 tablets (250 mg total) by mouth 2 (two) times daily with a meal. Opalski, Deborah, DO  Active   metoprolol  tartrate (LOPRESSOR ) 25 MG tablet [id number] Not taking TAKE ONE-HALF TABLET BY MOUTH TWICE DAILY AS NEEDED  Patient not taking: Reported on 04/23/2024   Loistine Sober, NP  Active   nystatin  (MYCOSTATIN /NYSTOP ) powder [id number] Yes Apply 1 Application topically 3 (three) times daily. Adele Song, MD  Active   phenazopyridine  (PYRIDIUM ) 200 MG tablet [id number] Not taking Take 1 tablet (200 mg total) by mouth 3 (three) times daily.  Patient not taking: Reported on 04/23/2024   Adele Song, MD  Active   polyethylene glycol powder (MIRALAX ) 17 GM/SCOOP powder [id number] Yes Take 17 g by mouth daily. Dissolve 1 capful (17g) in 4-8 ounces of liquid and take by mouth daily. Vivienne Nest M, PA-C  Active   pregabalin  (LYRICA ) 100 MG capsule [id number] Yes Take 1 capsule (100 mg total) by mouth 3 (three) times daily. Christine Rush, DPM  Active   Prenatal Vit-Fe Fum-FA-Omega (PNV PRENATAL PLUS MULTIVIT+DHA) 27-1 & 312 MG MISC [id number] Yes 1 po qd Midge Sober, DO  Active   Semaglutide , 1 MG/DOSE, 4 MG/3ML SOPN [id number] Yes Inject 1 mg as directed once a week. Midge Sober, DO  Active   topiramate  (TOPAMAX ) 25 MG tablet [id number] Yes Take 1 tablet (25 mg total) by mouth 2 (two) times daily. Midge Sober, DO  Active   traZODone (DESYREL) 50 MG tablet [id number] Yes Take 50 mg by mouth at bedtime as  needed for sleep. [provider]  Active Self  valsartan  (DIOVAN ) 40 MG tablet [id number] Not taking TAKE 1 Tablet BY MOUTH ONCE DAILY at bedtime  Patient not taking: Reported on 04/23/2024   Adele Song, MD  Active              Vitamin D , Ergocalciferol , (DRISDOL ) 1.25 MG (50000 UNIT) CAPS capsule [id number] Yes 1 tab q wed and 1 tab q sun Midge Sober, DO  Active             Recommendation:   PCP Follow-up Continue Current Plan of Care Attend scheduled mental health appointments for client at [facility] in [place], [place] Call LCSW as needed for SW support Call RN Nichole Jobs as needed for nursing support   Follow Up Plan:   Telephone follow up appointment date/time:  06/02/24 at 2;30 PM   Glendia Pear  MSW, LCSW [place]/Value Based Care [facility] Licensed Clinical Social Worker Direct Dial:  [phone] Fax:  [phone] Website:  [email or web address]

## 2024-04-23 NOTE — Patient Outreach (Signed)
 Social Drivers of Health  Community Resource and Care Coordination Visit Note   04/23/2024  Name: Karen Brennan MRN: [id number] DOB:04-Mar-1976  Situation: Referral received for [facility]DoH needs assessment and assistance related to Food Insecurity  utilities. I obtained verbal consent from Patient.  Visit completed with Patient on the phone.   Background:   SDOH Interventions Today    Flowsheet Row Most Recent Value  SDOH Interventions   Food Insecurity Interventions Community Resources Provided  [out of [employer] food project schedules shared with pt by mail. Patient also gets FNS.]  Housing Interventions Intervention Not Indicated  Transportation Interventions Intervention Not Indicated, Patient Resources (Friends/Family)  [daughter drives.]  Utilities Interventions [employer] Provided  [past due on utility. resources provided by email and mail again.]     Assessment:   Goals Addressed             This Visit's Progress    BSW VBCI Social Work Care Plan       Current SDOH Barriers:  Financial strain and utility disconnection notice.  Interventions: Referred patient to community resources  Provided patient with information about local utility assistance programs in [place] via email. Advised patient to call DSS (crisis intervention program), [employer], and [employer] about their financial assistance programs for us airways with disconnection.   04/23/2024 Provided patient food resources via mail and explained how out of the garden food project works.  Provide paper copy of utility resources sent via email.  Provided pt phone number to [facility] to f/u on referral for podiatry, which was placed on 10/29.           Recommendation:   attend all scheduled provider appointments call for transportation assistance at least one week before appointments call and/or follow up with out of the garden food project for food assistance Advised pt to  f/u with [facility] re podiatry referral placed on 10/29. Pt was provided phone number.   Follow Up Plan:   Telephone follow up appointment date/time:  05/07/2024 at 11:30AM.  [name], BSW [place]/VBCI - [facility] Social Worker [phone]

## 2024-04-23 NOTE — Telephone Encounter (Signed)
 Reviewed and agree with Dr Rennis plan.

## 2024-04-24 ENCOUNTER — Ambulatory Visit (INDEPENDENT_AMBULATORY_CARE_PROVIDER_SITE_OTHER): Admitting: Family Medicine

## 2024-04-24 ENCOUNTER — Ambulatory Visit (HOSPITAL_BASED_OUTPATIENT_CLINIC_OR_DEPARTMENT_OTHER): Payer: Self-pay | Admitting: Family Medicine

## 2024-04-24 ENCOUNTER — Encounter: Payer: Self-pay | Admitting: Family Medicine

## 2024-04-24 VITALS — BP 108/85 | HR 80 | Ht 73.0 in | Wt 341.4 lb

## 2024-04-24 DIAGNOSIS — M79652 Pain in left thigh: Secondary | ICD-10-CM | POA: Diagnosis present

## 2024-04-24 DIAGNOSIS — I10 Essential (primary) hypertension: Secondary | ICD-10-CM

## 2024-04-24 LAB — URINE CULTURE

## 2024-04-24 MED ORDER — METHOCARBAMOL 750 MG PO TABS: 750.0000 mg | ORAL_TABLET | Freq: Four times a day (QID) | ORAL | 0 refills | Status: AC | PRN

## 2024-04-24 NOTE — Assessment & Plan Note (Signed)
 Barely above goal for diastolic. This is most likely because patient is in pain. Avoid over treating due to history of hypotension.  - Follow up in one week  - Continue current regimen

## 2024-04-24 NOTE — Patient Instructions (Addendum)
 It was wonderful to see you today.  Please bring ALL of your medications with you to every visit.   Today we talked about:  Left leg pain - Seems to be more muscle related. However, given the location, we are also going to get hip xrays. I have prescribed you a muscle relaxant you can take up to 4 times a day as needed. Avoid driving after taking this medication as it can make you a little dizzy or tired.   Please follow up in 1 week for follow up on pain, and blood pressure.   Thank you for choosing [facility].   Please call [phone] with any questions about today's appointment.  Please be sure to schedule follow up at the front desk before you leave today.   Areta Saliva, MD  Family Medicine

## 2024-04-24 NOTE — Progress Notes (Signed)
   SUBJECTIVE:   CHIEF COMPLAINT / HPI:  Discussed the use of AI scribe software for clinical note transcription with the patient, who gave verbal consent to proceed.  History of Present Illness Karen Brennan is a 48 year old female who presents with left leg pain from the knee to the thigh.  Left lower extremity pain - Spasmodic pain localized from above the left knee to the thigh - Pain severity impacts ambulation and disrupts sleep - No associated swelling or erythema - describes pain as crampy, no burning or stabbing  - Ibuprofen  800 mg every eight hours for three days with minimal relief - No history of lower extremity blood clots, or current rash  - Patient had fall in May when she hit her knee, at that time xrays showed degenerative changes but no acute injury.   HTN  Patient has been taking her medication every day. She was recently decreased on her dose due to hypotension.     PERTINENT  PMH / PSH: T2DM, HLD, Bipolar 1, GAD, HTN   OBJECTIVE:  BP 108/85   Pulse 80   Ht 6' 1 (1.854 m)   Wt (!) 341 lb 6.4 oz (154.9 kg)   SpO2 97%   BMI 45.04 kg/m   Physical Exam EXTREMITIES: No warmth in left leg. Tenderness on palpation of left leg. No tenderness in the back of left knee. Pain on extension of left knee. Mild pain on flexion of left knee. No pain in groin area.  General: uncomfortable appearing, in no acute distress  CV: RRR, radial pulses equal and palpable, no BLE edema  Resp: Normal work of breathing on room air, CTAB Abd: Soft, non tender, non distended  Neuro: Alert & Oriented x 4  Derm: no rash on the leg or the back  MSK: Left leg  left leg symmetric in appearance to right.  Tender to deep palpation of anterolateral thigh  No erythema or edema of any part of the leg.  Log roll negative. FABER and FADIR negative  Anterior drawer, patellar grind test, valgus and varus manuevers negative  Patient has pain in thigh when extending the hip.   ASSESSMENT/PLAN:    Assessment & Plan Acute pain of left thigh Intractable despite ibuprofen  use. Broad ddx including hip arthritis, shingles, muscular spasm. Less likely shingles given no rash and quality of the pain. Less likely due to knee arthritis. Low likelihood dvt given symptoms and no lower leg erythema or edema.  - Xray left hip  - robaxin  750 every 6 hours prn  - follow up in one week  - If worsens or is as bad at next appointment could consider further advanced imaging, otherwise physical therapy.    Primary hypertension Barely above goal for diastolic. This is most likely because patient is in pain. Avoid over treating due to history of hypotension.  - Follow up in one week  - Continue current regimen    Areta Saliva, MD [facility]

## 2024-04-25 ENCOUNTER — Ambulatory Visit (HOSPITAL_COMMUNITY)
Admission: RE | Admit: 2024-04-25 | Discharge: 2024-04-25 | Disposition: A | Discharge status: Home / Self Care | Source: Ambulatory Visit | Attending: Family Medicine | Admitting: Family Medicine

## 2024-04-25 DIAGNOSIS — M79652 Pain in left thigh: Secondary | ICD-10-CM | POA: Insufficient documentation

## 2024-04-25 DIAGNOSIS — I878 Other specified disorders of veins: Secondary | ICD-10-CM | POA: Diagnosis not present

## 2024-04-25 DIAGNOSIS — G5793 Unspecified mononeuropathy of bilateral lower limbs: Secondary | ICD-10-CM | POA: Diagnosis not present

## 2024-04-28 DIAGNOSIS — G5793 Unspecified mononeuropathy of bilateral lower limbs: Secondary | ICD-10-CM | POA: Diagnosis not present

## 2024-04-30 DIAGNOSIS — G5793 Unspecified mononeuropathy of bilateral lower limbs: Secondary | ICD-10-CM | POA: Diagnosis not present

## 2024-05-02 ENCOUNTER — Encounter: Payer: Self-pay | Admitting: Pharmacist

## 2024-05-02 ENCOUNTER — Other Ambulatory Visit (HOSPITAL_COMMUNITY): Payer: Self-pay

## 2024-05-02 ENCOUNTER — Ambulatory Visit: Admitting: Pharmacist

## 2024-05-02 ENCOUNTER — Telehealth: Payer: Self-pay

## 2024-05-02 ENCOUNTER — Ambulatory Visit: Admitting: Family Medicine

## 2024-05-02 VITALS — BP 96/76 | HR 82 | Ht 73.0 in | Wt 343.4 lb

## 2024-05-02 VITALS — BP 110/82 | HR 84 | Ht 73.0 in | Wt 343.4 lb

## 2024-05-02 DIAGNOSIS — N390 Urinary tract infection, site not specified: Secondary | ICD-10-CM

## 2024-05-02 DIAGNOSIS — E1141 Type 2 diabetes mellitus with diabetic mononeuropathy: Secondary | ICD-10-CM | POA: Diagnosis present

## 2024-05-02 DIAGNOSIS — M79652 Pain in left thigh: Secondary | ICD-10-CM

## 2024-05-02 DIAGNOSIS — G5793 Unspecified mononeuropathy of bilateral lower limbs: Secondary | ICD-10-CM | POA: Diagnosis not present

## 2024-05-02 DIAGNOSIS — J45909 Unspecified asthma, uncomplicated: Secondary | ICD-10-CM | POA: Diagnosis not present

## 2024-05-02 DIAGNOSIS — Z6841 Body Mass Index (BMI) 40.0 and over, adult: Secondary | ICD-10-CM | POA: Diagnosis not present

## 2024-05-02 MED ORDER — MOUNJARO 10 MG/0.5ML ~~LOC~~ SOAJ: 10.0000 mg | SUBCUTANEOUS | 2 refills | Status: DC

## 2024-05-02 MED ORDER — TIRZEPATIDE 10 MG/0.5ML ~~LOC~~ SOAJ: 10.0000 mg | SUBCUTANEOUS | 2 refills | Status: DC

## 2024-05-02 NOTE — Telephone Encounter (Signed)
 Patient calls nurse line in regards to Mounjaro .   She reports this medication is needing a PA.   Advised will forward to pharmacy team.

## 2024-05-02 NOTE — Assessment & Plan Note (Signed)
 Patient is not satisfied with Ozempic , feels she has gained weight on this.  Can trial Mounjaro .  Has follow-up appointment with [facility] weight [facility] wellness in December.  Advised follow-up with me in 1 month.  Pulmonology or podiatry recently, sent referrals for both - tirzepatide  (MOUNJARO ) 10 MG/0.5ML Pen; Inject 10 mg into the skin once a week.  Dispense: 0.5 mL; Refill: 2 - Ambulatory referral to Ophthalmology - Ambulatory referral to Podiatry

## 2024-05-02 NOTE — Patient Instructions (Signed)
 Thank you for completing Spirometry testing! Your lung function is normal.  Continue all medications the same.

## 2024-05-02 NOTE — Progress Notes (Signed)
    SUBJECTIVE:   CHIEF COMPLAINT / HPI:   Seen 11/13 by Dr Nicholas for anterior left leg pain -- Left hip x-ray showed no abnormalities -- Prescribed Robaxin  -- States Robaxin  is helping, pain is a bit better -- Denies numbness or paresthesias  Interested in switching weight loss medication --Has gained weight on Ozempic  --Interested in Mounjaro   Recurrent UTI - Has an appt with [facility] in late december -- CT abdomen pelvis pending  Albuterol  use -- PFTs performed by Dr. Koval this morning, showed intact lung function --pt advised about increasing physical activity to help with sensation of dyspnea  PERTINENT  PMH / PSH: Hypertension, GERD, constipation, type 2 diabetes, hyperlipidemia,  OBJECTIVE:   BP 110/82   Pulse 84   Ht 6' 1 (1.854 m)   Wt (!) 343 lb 6.4 oz (155.8 kg)   SpO2 100%   BMI 45.31 kg/m   - General: No acute distress. Awake and conversant. - Eyes: Normal conjunctiva, anicteric. Round symmetric pupils. - ENT: Hearing grossly intact. No nasal discharge. - Neck: Neck is supple. No masses or thyromegaly. - Respiratory: Respirations are non-labored. - Skin: No visible rashes or ulcers. - Psych: Alert and oriented. Cooperative, Appropriate mood and affect, Normal judgment. - Neuro: Sensation and CN II-XII grossly normal.   ASSESSMENT/PLAN:   Assessment & Plan Type 2 diabetes mellitus with diabetic mononeuropathy, without long-term current use of insulin  (HCC) BMI 45.0-49.9, adult (HCC)-current bmi 46.8 Patient is not satisfied with Ozempic , feels she has gained weight on this.  Can trial Mounjaro .  Has follow-up appointment with healthy weight and wellness in December.  Advised follow-up with me in 1 month.  Pulmonology or podiatry recently, sent referrals for both - tirzepatide  (MOUNJARO ) 10 MG/0.5ML Pen; Inject 10 mg into the skin once a week.  Dispense: 0.5 mL; Refill: 2 - Ambulatory referral to Ophthalmology - Ambulatory referral to  Podiatry Recurrent UTI Patient has appointment with urology office in January.  Advised following with me after this Acute pain of left thigh Pain is improved on muscle relaxer.  Reviewed return precautions.     Rea Raring, MD [facility]

## 2024-05-02 NOTE — Progress Notes (Signed)
   S:     Chief Complaint  Patient presents with   Medication Management    PFT spirometry   48 y.o. female who presents for respiratory evaluation, education, and management. Patient arrives in good spirits and presents without any assistance.   Patient was referred and last seen by Primary Care Provider, Dr. Nicholas, on 04/24/2024.  At last visit, patient was referred for PFT spirometry testing.   PMH is significant for T2DM, hypertension, hyperlipidemia, asthma, GERD  Patient reports breathing has been poor and had shortness of breath. Patient  Patient reports adherence to medications Current asthma medications: albuterol  90 mcg  Rescue inhaler use frequency: twice a month  O: Review of Systems  All other systems reviewed and are negative.  Physical Exam Vitals reviewed.  Constitutional:      Appearance: Normal appearance.  Neurological:     Mental Status: She is alert.  Psychiatric:        Mood and Affect: Mood normal.        Behavior: Behavior normal.        Thought Content: Thought content normal.        Judgment: Judgment normal.    Vitals:   05/02/24 0907  BP: 96/76  Pulse: 82  SpO2: 97%    See scanned report or Documentation Flowsheet (discrete results - PFTs) for  Spirometry results. Patient provided good effort while attempting spirometry.  Lung Age = 13  Patient is participating in a Managed Medicaid Plan:  Yes   A/P: History of asthma as a child.  Patient has been experiencing shortness of breath for over a year and taking albuterol  few times a month. Medication adherence good.  Spirometry evaluation reveals normal lung function.  Reviewed results of pulmonary function tests.  -Counseled patient on management of SOB by doing more exercise and weight loss.   Total time in face to face counseling 20 minutes.    Follow-up:  PCP Visit today with Dr. Adele Patient seen with Lawson Mao, PharmD Candidate - PY3 student and [employer] PharmD - PY4  Candidate.

## 2024-05-02 NOTE — Patient Instructions (Addendum)
 Thank you for coming in today! Here is a summary of what we discussed:  -- I sent in a prescription for Mounjaro , if there are issues with this I will let you know.  Please make a follow-up in early January so we can see how it is going  --I sent a referral for ophthalmology and podiatry, you should hear from them to schedule an appointment in a few weeks.  -- Please let me know if your leg pain changes or gets worse, happy to see you sooner than January if needed  Please call the clinic at [phone] if your symptoms worsen or you have any concerns.  Best, Dr Adele

## 2024-05-02 NOTE — Assessment & Plan Note (Signed)
 History of asthma as a child.  Patient has been experiencing shortness of breath for over a year and taking albuterol  few times a month. Medication adherence good.  Spirometry evaluation reveals normal lung function.  Reviewed results of pulmonary function tests.  -Counseled patient on management of SOB by doing more exercise and weight loss.

## 2024-05-03 ENCOUNTER — Other Ambulatory Visit: Payer: Self-pay | Admitting: Family Medicine

## 2024-05-03 DIAGNOSIS — E1141 Type 2 diabetes mellitus with diabetic mononeuropathy: Secondary | ICD-10-CM

## 2024-05-03 DIAGNOSIS — Z6841 Body Mass Index (BMI) 40.0 and over, adult: Secondary | ICD-10-CM

## 2024-05-03 MED ORDER — SEMAGLUTIDE (2 MG/DOSE) 8 MG/3ML ~~LOC~~ SOPN: 2.0000 mg | PEN_INJECTOR | SUBCUTANEOUS | 2 refills | Status: DC

## 2024-05-05 ENCOUNTER — Ambulatory Visit: Payer: Self-pay | Admitting: Family Medicine

## 2024-05-05 DIAGNOSIS — G5793 Unspecified mononeuropathy of bilateral lower limbs: Secondary | ICD-10-CM | POA: Diagnosis not present

## 2024-05-07 ENCOUNTER — Other Ambulatory Visit: Payer: Self-pay

## 2024-05-07 DIAGNOSIS — G5793 Unspecified mononeuropathy of bilateral lower limbs: Secondary | ICD-10-CM | POA: Diagnosis not present

## 2024-05-07 NOTE — Patient Instructions (Signed)
 Visit Information  Karen Brennan was given information about Medicaid Managed Care team care coordination services as a part of their [facility] Community Plan Medicaid benefit.   If you would like to schedule transportation through your [facility], please call the following number at least 2 days in advance of your appointment: [phone]   Rides for urgent appointments can also be made after hours by calling Member Services.  Call the Behavioral Health Crisis Line at [phone], at any time, 24 hours a day, 7 days a week. If you are in danger or need immediate medical attention call 911.  Please see education materials related to transportation provided by email.  Patient verbalizes understanding of instructions and care plan provided today and agrees to view in MyChart. Active MyChart status and patient understanding of how to access instructions and care plan via MyChart confirmed with patient.     Telephone follow up appointment with Managed Medicaid care management team member scheduled for: 05/21/2024 at 11:30AM.  [name], BSW [place]/VBCI - [facility] Social Worker [phone]   Following is a copy of your plan of care:  There are no care plans that you recently modified to display for this patient.

## 2024-05-07 NOTE — Patient Outreach (Signed)
 Social Drivers of Health  Community Resource and Care Coordination Visit Note   05/07/2024  Name: Karen Brennan MRN: [id number] DOB:1976/02/09  Situation: Referral received for [facility] needs assessment and assistance related to Financial Strain  Food Insecurity  utilities . I obtained verbal consent from Patient.  Visit completed with Patient on the phone.   Background:   SDOH Interventions Today    Flowsheet Row Most Recent Value  SDOH Interventions   Food Insecurity Interventions --  [Patient received food resources from BSW and still getting FNS.]  Housing Interventions Intervention Not Indicated  Transportation Interventions Patient Resources (Friends/Family)  [daughter drives.]  Utilities Interventions Community Resources Provided  [patient has a utility disconnect notice for 12/08. Patient will be calling utility assistance resources shared by BSW>]     Assessment:   Goals Addressed             This Visit's Progress    BSW VBCI Social Work Care Plan       Current SDOH Barriers:  Financial strain and utility disconnection notice.  Interventions: Referred patient to community resources  Provided patient with information about local utility assistance programs in [place] via email. Advised patient to call DSS (crisis intervention program), [employer], and [employer] about their financial assistance programs for us airways with disconnection.   04/23/2024 Provided patient food resources via mail and explained how out of the garden food project works.  Provide paper copy of utility resources sent via email.  Provided pt phone number to [facility] to f/u on referral for podiatry, which was placed on 10/29.  05/07/24 Patient was provided with transportation one-pager for Medicaid transportation through [facility].          Recommendation:   attend all scheduled provider appointments call for transportation assistance at least one week before  appointments ask for help if you don't understand your health insurance benefits call and/or follow up with out of the garden food project for food assistance Call DSS and [employer] re utility assistance with disconnection bill.   Follow Up Plan:   Telephone follow up appointment date/time:  05/21/2024 at 11:30AM  [name], BSW [place]/VBCI - [facility] Social Worker [phone]

## 2024-05-09 DIAGNOSIS — G5793 Unspecified mononeuropathy of bilateral lower limbs: Secondary | ICD-10-CM | POA: Diagnosis not present

## 2024-05-12 ENCOUNTER — Ambulatory Visit: Admitting: Family Medicine

## 2024-05-12 ENCOUNTER — Telehealth: Admitting: Emergency Medicine

## 2024-05-12 ENCOUNTER — Other Ambulatory Visit (INDEPENDENT_AMBULATORY_CARE_PROVIDER_SITE_OTHER): Payer: Self-pay | Admitting: Family Medicine

## 2024-05-12 VITALS — BP 124/82 | HR 77 | Temp 98.6°F | Ht 73.0 in | Wt 347.4 lb

## 2024-05-12 DIAGNOSIS — R3989 Other symptoms and signs involving the genitourinary system: Secondary | ICD-10-CM

## 2024-05-12 DIAGNOSIS — G43109 Migraine with aura, not intractable, without status migrainosus: Secondary | ICD-10-CM

## 2024-05-12 DIAGNOSIS — G5793 Unspecified mononeuropathy of bilateral lower limbs: Secondary | ICD-10-CM | POA: Diagnosis not present

## 2024-05-12 DIAGNOSIS — N3000 Acute cystitis without hematuria: Secondary | ICD-10-CM

## 2024-05-12 LAB — POCT URINALYSIS DIP (MANUAL ENTRY)
Bilirubin, UA: NEGATIVE
Blood, UA: NEGATIVE
Glucose, UA: NEGATIVE mg/dL
Ketones, POC UA: NEGATIVE mg/dL
Leukocytes, UA: NEGATIVE
Nitrite, UA: NEGATIVE
Protein Ur, POC: NEGATIVE mg/dL
Spec Grav, UA: 1.02 (ref 1.010–1.025)
Urobilinogen, UA: 0.2 U/dL
pH, UA: 5 (ref 5.0–8.0)

## 2024-05-12 MED ORDER — PHENAZOPYRIDINE HCL 200 MG PO TABS: 200.0000 mg | ORAL_TABLET | Freq: Three times a day (TID) | ORAL | 0 refills | Status: AC

## 2024-05-12 MED ORDER — CEPHALEXIN 500 MG PO CAPS: 500.0000 mg | ORAL_CAPSULE | Freq: Two times a day (BID) | ORAL | 0 refills | Status: DC

## 2024-05-12 NOTE — Progress Notes (Signed)
    SUBJECTIVE:   CHIEF COMPLAINT / HPI:   UTI --[facility] --CTAP scheduled 12/11 10:30 AM --Has recurrent UTIs- Cultures grew pansusceptible E coli. Last one in early Nov, treated with 14 days cipro . Have also treated with Keflex .  --2-3 days of dysuria, typical sx for her --alternating tylenol  and ibuprofen , unsure if she's had fever --good PO intake --some nausea, feels jittery --some low back pain  Obesity Increased ozempic  to 2mg  weekly Has taken one dose so far, no concerns  PERTINENT  PMH / PSH: reviewed  OBJECTIVE:   BP 124/82   Pulse 77   Temp 98.6 F (37 C)   Ht 6' 1 (1.854 m)   Wt (!) 347 lb 6.4 oz (157.6 kg)   SpO2 100%   BMI 45.83 kg/m   General: Awake and conversant, no acute distress Pulm: normal work of breathing on room air MSK: no CVA tenderness BL Neuro: No focal deficits Psych: Appropriate mood and affect   ASSESSMENT/PLAN:   Assessment & Plan Suspected UTI Likely recurrent E coli UTI. Appears well overall, no concern for pyelo at this time. Will treat empirically with 7 days Keflex  500mg  BID and follow up if urine culture shows resistance. Pt has appt with urology in a few weeks and will be getting a CTAP next week to eval for anatomical issues contributing to recurrent UTI. Will also refill pyridium  as pt states this has been helpful.  Dipstick UA negative, given symptoms will still have pt start Keflex  and await urine micro and culture to confirm lack of infection. - Urine Culture - POCT UA - Microscopic Only - cephALEXin  (KEFLEX ) 500 MG capsule; Take 1 capsule (500 mg total) by mouth 2 (two) times daily.  Dispense: 14 capsule; Refill: 0 - phenazopyridine  (PYRIDIUM ) 200 MG tablet; Take 1 tablet (200 mg total) by mouth 3 (three) times daily.  Dispense: 6 tablet; Refill: 0     Rea Raring, MD [facility]

## 2024-05-12 NOTE — Patient Instructions (Signed)
 Thank you for coming in today! Here is a summary of what we discussed:  -I sent in 7 days of antibiotics (you will take 2 per day) and a refill of pyridium . Please let me know if your symptoms get worse or do not improve in the next few days. I will contact you once I see the lab results and the CT scan result.   Please call the clinic at [phone] if your symptoms worsen or you have any concerns.  Best, Dr Adele

## 2024-05-12 NOTE — Progress Notes (Signed)
 I verified pt identity. After review of recent history, pt with recurrent UTIs, recently on antibiotics. She will need urine testing that cannot be done via virtual care. I referred her to her PCP's office or urgent care today.   Jon Belt, PhD, FNP-BC [facility] Phone: [phone] 05/12/2024 8:38 AM

## 2024-05-14 DIAGNOSIS — G5793 Unspecified mononeuropathy of bilateral lower limbs: Secondary | ICD-10-CM | POA: Diagnosis not present

## 2024-05-15 ENCOUNTER — Encounter: Payer: Self-pay | Admitting: Family Medicine

## 2024-05-15 LAB — URINE CULTURE

## 2024-05-16 ENCOUNTER — Other Ambulatory Visit: Payer: Self-pay | Admitting: *Deleted

## 2024-05-16 DIAGNOSIS — G5793 Unspecified mononeuropathy of bilateral lower limbs: Secondary | ICD-10-CM | POA: Diagnosis not present

## 2024-05-16 NOTE — Patient Instructions (Signed)
 Karen Brennan - I am sorry I was unable to complete outreach you today for our scheduled appointment. I work with Adele Song, MD and am calling to support your healthcare needs.  I have rescheduled your appointment for 06/20/24 @ 9:30 am.  Please contact me at [phone] at your earliest convenience. I look forward to speaking with you soon.   Thank you,  Mackensi Mahadeo, RN, BSN, ACM RN Care Manager [employer] [phone]

## 2024-05-19 ENCOUNTER — Other Ambulatory Visit: Payer: Self-pay | Admitting: Family Medicine

## 2024-05-19 ENCOUNTER — Ambulatory Visit (INDEPENDENT_AMBULATORY_CARE_PROVIDER_SITE_OTHER): Payer: Self-pay | Admitting: Family Medicine

## 2024-05-19 DIAGNOSIS — M79652 Pain in left thigh: Secondary | ICD-10-CM

## 2024-05-19 DIAGNOSIS — G5793 Unspecified mononeuropathy of bilateral lower limbs: Secondary | ICD-10-CM | POA: Diagnosis not present

## 2024-05-19 MED ORDER — IBUPROFEN 800 MG PO TABS: 800.0000 mg | ORAL_TABLET | Freq: Three times a day (TID) | ORAL | 0 refills | Status: AC | PRN

## 2024-05-20 ENCOUNTER — Other Ambulatory Visit

## 2024-05-20 ENCOUNTER — Ambulatory Visit: Admitting: Podiatry

## 2024-05-20 NOTE — Patient Outreach (Signed)
 Social Drivers of Health  Community Resource and Care Coordination Visit Note   05/20/2024  Name: Karen Brennan MRN: [id number] DOB:Sep 23, 1975  Situation: Referral received for [facility] needs assessment and assistance related to [employer] . I obtained verbal consent from Patient.  Visit completed with Patient on the phone.   Background:   SDOH Interventions Today    Flowsheet Row Most Recent Value  SDOH Interventions   Food Insecurity Interventions Community Resources Provided  [BSW encouraged patient to use food resources provided for out of thhe garden project.]  Housing Interventions Intervention Not Indicated  Transportation Interventions --  [BSW will work with pt to complete SCAT application.]  Utilities Interventions Community Resources Provided  [was able to pay disconnect notice to avoid disconnection but still has pending balance. WIll go to DSS to inquire about financial assistance with bill.]     Assessment:   Goals Addressed             This Visit's Progress    BSW Goals       Current SDOH Barriers:  Financial strain and utility disconnection notice.  Interventions: Referred patient to community resources  Provided patient with information about local utility assistance programs in [place] via email. Advised patient to call DSS (crisis intervention program), [employer], and [employer] about their financial assistance programs for us airways with disconnection.   04/23/2024 Provided patient food resources via mail and explained how out of the garden food project works.  Provide paper copy of utility resources sent via email.  Provided pt phone number to [facility] to f/u on referral for podiatry, which was placed on 10/29.  05/07/24 Patient was provided with transportation one-pager for Medicaid transportation through [facility]. 05/20/2024 Pt was able to pay disconnection amount to avoid disconnect. Will be  going to DSS to apply for help with reminder amount of bill.  BSW completed SCAT application for pt part A. Part  B will be sent to provider.           Recommendation:   attend all scheduled provider appointments call for transportation assistance at least one week before appointments call and/or follow up with food resources  for food assistance F/u with DSS about crisis intervention program assistance Complete Part B SCAT Application.  Follow Up Plan:   Telephone follow up appointment date/time:  06/03/2024 at 10:30AM.  [name], BSW [place]/VBCI - [facility] Social Worker [phone]

## 2024-05-20 NOTE — Patient Instructions (Signed)
 Visit Information  Karen Brennan was given information about Medicaid Managed Care team care coordination services as a part of their [facility] Community Plan Medicaid benefit.   If you would like to schedule transportation through your [facility], please call the following number at least 2 days in advance of your appointment: [phone]   Rides for urgent appointments can also be made after hours by calling Member Services.  Call the Behavioral Health Crisis Line at [phone], at any time, 24 hours a day, 7 days a week. If you are in danger or need immediate medical attention call 911.   Patient verbalizes understanding of instructions and care plan provided today and agrees to view in MyChart. Active MyChart status and patient understanding of how to access instructions and care plan via MyChart confirmed with patient.     Telephone follow up appointment with Managed Medicaid care management team member scheduled for: 06/03/2024 at 10:30AM.  [name], BSW [place]/VBCI - [facility] Social Worker [phone]   Following is a copy of your plan of care:   Goals Addressed             This Visit's Progress    BSW Goals       Current SDOH Barriers:  Financial strain and utility disconnection notice.  Interventions: Referred patient to community resources  Provided patient with information about local utility assistance programs in [place] via email. Advised patient to call DSS (crisis intervention program), [employer], and [employer] about their financial assistance programs for us airways with disconnection.   04/23/2024 Provided patient food resources via mail and explained how out of the garden food project works.  Provide paper copy of utility resources sent via email.  Provided pt phone number to [facility] to f/u on referral for podiatry, which was placed on 10/29.  05/07/24 Patient was provided  with transportation one-pager for Medicaid transportation through [facility]. 05/20/2024 Pt was able to pay disconnection amount to avoid disconnect. Will be going to DSS to apply for help with reminder amount of bill.  BSW completed SCAT application for pt part A. Part  B will be sent to provider.

## 2024-05-21 ENCOUNTER — Ambulatory Visit (INDEPENDENT_AMBULATORY_CARE_PROVIDER_SITE_OTHER): Admitting: Podiatry

## 2024-05-21 ENCOUNTER — Ambulatory Visit (INDEPENDENT_AMBULATORY_CARE_PROVIDER_SITE_OTHER)

## 2024-05-21 ENCOUNTER — Telehealth

## 2024-05-21 DIAGNOSIS — M722 Plantar fascial fibromatosis: Secondary | ICD-10-CM

## 2024-05-21 DIAGNOSIS — G5793 Unspecified mononeuropathy of bilateral lower limbs: Secondary | ICD-10-CM | POA: Diagnosis not present

## 2024-05-21 DIAGNOSIS — M7732 Calcaneal spur, left foot: Secondary | ICD-10-CM

## 2024-05-21 DIAGNOSIS — M7731 Calcaneal spur, right foot: Secondary | ICD-10-CM

## 2024-05-21 MED ORDER — PREDNISONE 5 MG PO TABS: ORAL_TABLET | ORAL | 0 refills | Status: AC

## 2024-05-21 NOTE — Patient Instructions (Signed)

## 2024-05-21 NOTE — Progress Notes (Signed)
 Patient presents with a complaint of pain along the bottoms of the feet from the heel to the MTPs.  Has been bothering her for several weeks now.  Also is a diabetic.  Is not complaining of burning or numbness in the feet.  Does not recall any injury.   Physical exam:  General appearance: Pleasant, and in no acute distress. AOx3.  Vascular: Pedal pulses: DP 2/4 bilaterally, PT 2/4 bilaterally.  Mild to moderate edema lower legs bilaterally. Capillary fill time immediate.  Neurological: Light touch intact feet bilaterally.  Normal Achilles reflex bilaterally.  No clonus or spasticity noted.  Negative Tinel sign tarsal tunnel and porta pedis bilaterally.  Negative Mulder sign IMS's bilaterally  Dermatologic:   Skin normal temperature bilaterally.  Skin normal color, tone, and texture bilaterally.   Musculoskeletal: Tenderness along the entirety of the plantar fascia from the calcaneal tubercles to the slips of the MTPs bilaterally.  Possibly a small fibroma along the medial edge on the left plantar fascia.  No tenderness range of motion of the lesser MTPs.  Slight soreness on the posterior aspect of the heel at the insertion of the Achilles tendon bilaterally  Radiographs: 3 views foot bilaterally: Osteophytic changes posterior aspect calcaneus.  Notes any fractures or dislocations.  No erosive changes around the calcaneus.  Normal bone density.  No evidence of bone tumors.  Diagnosis: 1.  Plantar fasciitis bilaterally 2.  Calcaneal spur bilaterally  Plan: -New patient office visit for evaluation and management level 3. - Discussed with her plantar fasciitis and etiology and treatment.  Gave shoe recommendations.  Avoid flat soled shoes without support and going in bare feet.  Will give exercises to do. -Gave written instructions for at home exercises to do. - Rx prednisone  5 mg, 30 mg p.o. daily first day, then decrease by 5 mg every other day for 12 days.   Return 2 weeks follow-up  plantar fasciitis bilaterally

## 2024-05-22 ENCOUNTER — Ambulatory Visit (HOSPITAL_COMMUNITY)
Admission: RE | Admit: 2024-05-22 | Discharge: 2024-05-22 | Disposition: A | Discharge status: Home / Self Care | Source: Ambulatory Visit | Attending: Family Medicine | Admitting: Family Medicine

## 2024-05-22 ENCOUNTER — Encounter: Payer: Self-pay | Admitting: Family Medicine

## 2024-05-22 DIAGNOSIS — N3000 Acute cystitis without hematuria: Secondary | ICD-10-CM | POA: Diagnosis present

## 2024-05-22 LAB — POCT I-STAT CREATININE: Creatinine, Ser: 1 mg/dL (ref 0.44–1.00)

## 2024-05-22 MED ORDER — IOHEXOL 350 MG/ML SOLN
100.0000 mL | Freq: Once | INTRAVENOUS | Status: AC | PRN
  Administered 2024-05-22: 100 mL via INTRAVENOUS

## 2024-05-23 DIAGNOSIS — G5793 Unspecified mononeuropathy of bilateral lower limbs: Secondary | ICD-10-CM | POA: Diagnosis not present

## 2024-05-26 DIAGNOSIS — G5793 Unspecified mononeuropathy of bilateral lower limbs: Secondary | ICD-10-CM | POA: Diagnosis not present

## 2024-05-27 ENCOUNTER — Encounter (HOSPITAL_BASED_OUTPATIENT_CLINIC_OR_DEPARTMENT_OTHER): Payer: Self-pay | Admitting: Family Medicine

## 2024-05-27 ENCOUNTER — Ambulatory Visit: Payer: Self-pay | Admitting: Family Medicine

## 2024-05-27 ENCOUNTER — Telehealth: Payer: Self-pay

## 2024-05-27 ENCOUNTER — Encounter: Payer: Self-pay | Admitting: Family Medicine

## 2024-05-27 ENCOUNTER — Other Ambulatory Visit (HOSPITAL_COMMUNITY): Payer: Self-pay

## 2024-05-27 VITALS — BP 118/87 | HR 99 | Ht 73.0 in | Wt 336.8 lb

## 2024-05-27 DIAGNOSIS — R110 Nausea: Secondary | ICD-10-CM

## 2024-05-27 DIAGNOSIS — Z6841 Body Mass Index (BMI) 40.0 and over, adult: Secondary | ICD-10-CM | POA: Diagnosis not present

## 2024-05-27 DIAGNOSIS — E1141 Type 2 diabetes mellitus with diabetic mononeuropathy: Secondary | ICD-10-CM

## 2024-05-27 DIAGNOSIS — R42 Dizziness and giddiness: Secondary | ICD-10-CM

## 2024-05-27 DIAGNOSIS — W19XXXA Unspecified fall, initial encounter: Secondary | ICD-10-CM

## 2024-05-27 MED ORDER — BLOOD PRESSURE CUFF MISC: 0 refills | Status: AC

## 2024-05-27 MED ORDER — ONDANSETRON 4 MG PO TBDP: 4.0000 mg | ORAL_TABLET | Freq: Three times a day (TID) | ORAL | 0 refills | Status: AC | PRN

## 2024-05-27 MED ORDER — TRULICITY 3 MG/0.5ML ~~LOC~~ SOAJ: 3.0000 mg | SUBCUTANEOUS | 3 refills | Status: AC

## 2024-05-27 NOTE — Telephone Encounter (Signed)
 Pharmacy Patient Advocate Encounter   Received notification from Patient Pharmacy that prior authorization for TRULICITY  3MG  is required/requested.   Insurance verification completed.   The patient is insured through [facility] MEDICAID.   PA required; PA started via CoverMyMeds. KEY [id number] . Waiting for clinical questions to populate.

## 2024-05-27 NOTE — Telephone Encounter (Signed)
 Pharmacy Patient Advocate Encounter   PA required; PA submitted to above mentioned insurance via Latent Key/confirmation #/EOC [id number]. Status is pending

## 2024-05-27 NOTE — Assessment & Plan Note (Signed)
 Unfortunately, pt unable to tolerate higher dose of ozempic . Will switch to Trulicity - starting at 3mg  dose and can increase to 4.5mg  weekly if needed. Advised 2-3 week follow up.  Pt states she has been drinking premier protein shakes- unfortunately unable to afford these. Will look into prescription or assistance program. - Dulaglutide  (TRULICITY ) 3 MG/0.5ML SOAJ; Inject 3 mg as directed once a week.  Dispense: 0.5 mL; Refill: 3

## 2024-05-27 NOTE — Progress Notes (Signed)
° ° °  SUBJECTIVE:   CHIEF COMPLAINT / HPI:   Fall 2 days ago --walking to the bathroom at home --dizzy, woozy, LOC for 1 second, fell forward, hit thighs on ground. Did not hit head --felt normal after a few minutes --had already been feeling nauseous prior to this --no palpitations or abnormal heart rhythm during syncopal episode --has not been able to eat much in the last few days --took 2mg  ozempic  2 times so far, last dose was 05/20/24. 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 Was taking 1mg  prior to this.   Premier protein chocolate -- 3 a day  PERTINENT  PMH / PSH: reviewed  OBJECTIVE:   BP 118/87   Pulse 99   Ht 6' 1 (1.854 m)   Wt (!) 336 lb 12.8 oz (152.8 kg)   SpO2 100%   BMI 44.44 kg/m   General: Awake and conversant, no acute distress HEENT; normocephalic and atraumatic CV: RRR, normal S1/S2, no M/R/G Pulm: normal work of breathing on room air Abd: normoactive BS, soft, nontender, nondistended Neuro: No focal deficits Psych: Appropriate mood and affect   ASSESSMENT/PLAN:   Assessment & Plan Type 2 diabetes mellitus with diabetic mononeuropathy, without long-term current use of insulin  (HCC) BMI 45.0-49.9, adult (HCC)-current bmi 46.8 Unfortunately, pt unable to tolerate higher dose of ozempic . Will switch to Trulicity - starting at 3mg  dose and can increase to 4.5mg  weekly if needed. Advised 2-3 week follow up.  Pt states she has been drinking premier protein shakes- unfortunately unable to afford these. Will look into prescription or assistance  program. - Dulaglutide  (TRULICITY ) 3 MG/0.5ML SOAJ; Inject 3 mg as directed once a week.  Dispense: 0.5 mL; Refill: 3 Nausea Appears to be related to increased dose of Ozempic , especially given that this has happened in the past. Will check basic blood work today. Sent in Zofran  per pt request. - ondansetron  (ZOFRAN -ODT) 4 MG disintegrating tablet; Take 1 tablet (4 mg total) by mouth every 8 (eight) hours as needed for nausea.  Dispense: 10 tablet; Refill: 0 - Comprehensive metabolic panel with GFR - CBC Dizziness Fall, initial encounter Fall at home, no signs of traumatic injury on exam today. Does not sound like cardiac syncope. Dizziness/possible vertigo may be related to poor PO intake in setting of Ozempic  as above. Also sounds like BPPV- pt declined Epley maneuver in office due to nausea. Provided instructions on home Epley maneuver. Follow up in 2-3 weeks, can try Epley at that time and look into other etiologies if needed.     Rea Raring, MD [facility]

## 2024-05-27 NOTE — Patient Instructions (Addendum)
 Thank you for coming in today! Here is a summary of what we discussed:  -We are going to switch to Trulicity  today. You can start using it as soon as you get it. Stop taking the Ozempic   -I sent in Zofran   -Try the Epley maneuver at home to see if this helps with your dizziness/vertigo  -I will ask about the protein shakes and let you know  Please schedule a follow up appt with me in 2-3 weeks  Please call the clinic at [phone] if your symptoms worsen or you have any concerns.  Best, Dr Adele

## 2024-05-28 ENCOUNTER — Ambulatory Visit: Payer: Self-pay | Admitting: Family Medicine

## 2024-05-28 DIAGNOSIS — G5793 Unspecified mononeuropathy of bilateral lower limbs: Secondary | ICD-10-CM | POA: Diagnosis not present

## 2024-05-28 LAB — COMPREHENSIVE METABOLIC PANEL WITH GFR
ALT: 26 IU/L (ref 0–32)
AST: 20 IU/L (ref 0–40)
Albumin: 4.4 g/dL (ref 3.9–4.9)
Alkaline Phosphatase: 69 IU/L (ref 41–116)
BUN/Creatinine Ratio: 15 (ref 9–23)
BUN: 14 mg/dL (ref 6–24)
Bilirubin Total: 0.7 mg/dL (ref 0.0–1.2)
CO2: 26 mmol/L (ref 20–29)
Calcium: 9.7 mg/dL (ref 8.7–10.2)
Chloride: 99 mmol/L (ref 96–106)
Creatinine, Ser: 0.96 mg/dL (ref 0.57–1.00)
Globulin, Total: 2.7 g/dL (ref 1.5–4.5)
Glucose: 89 mg/dL (ref 70–99)
Potassium: 4.5 mmol/L (ref 3.5–5.2)
Sodium: 139 mmol/L (ref 134–144)
Total Protein: 7.1 g/dL (ref 6.0–8.5)
eGFR: 73 mL/min/1.73 (ref >59)

## 2024-05-28 LAB — CBC
Hematocrit: 44.5 % (ref 34.0–46.6)
Hemoglobin: 14.3 g/dL (ref 11.1–15.9)
MCH: 30.1 pg (ref 26.6–33.0)
MCHC: 32.1 g/dL (ref 31.5–35.7)
MCV: 94 fL (ref 79–97)
Platelets: 343 x10E3/uL (ref 150–450)
RBC: 4.75 x10E6/uL (ref 3.77–5.28)
RDW: 11.8 % (ref 11.7–15.4)
WBC: 6.1 x10E3/uL (ref 3.4–10.8)

## 2024-05-28 NOTE — Telephone Encounter (Signed)
 Pharmacy Patient Advocate Encounter  Received notification from [facility] MEDICAID that Prior Authorization for TRULICITY  3MG  has been APPROVED from 05/27/24 to 05/27/25   PA #/Case ID/Reference #: [id number]

## 2024-05-29 ENCOUNTER — Telehealth: Payer: Self-pay

## 2024-05-29 NOTE — Telephone Encounter (Signed)
 Nena with [facility] calls nurse line checking the status of form.   She reports she faxed over personal care services form earlier in December and most recently on 12/11.  Advised I do not see any notes and nothing was in PCP box.   Advised will forward to PCP for update.

## 2024-05-30 NOTE — Telephone Encounter (Signed)
 Called Karen Brennan.   She reports she will refax this back to our office.   She asks I call her to confirm form has been received.

## 2024-05-30 NOTE — Telephone Encounter (Signed)
 VM left for Nena advising on form received.

## 2024-06-02 ENCOUNTER — Other Ambulatory Visit: Payer: Self-pay | Admitting: Licensed Clinical Social Worker

## 2024-06-02 ENCOUNTER — Other Ambulatory Visit: Payer: Self-pay | Admitting: Family Medicine

## 2024-06-02 DIAGNOSIS — J302 Other seasonal allergic rhinitis: Secondary | ICD-10-CM

## 2024-06-03 ENCOUNTER — Other Ambulatory Visit: Payer: Self-pay

## 2024-06-03 ENCOUNTER — Telehealth: Payer: Self-pay | Admitting: Podiatry

## 2024-06-03 ENCOUNTER — Ambulatory Visit: Admitting: Podiatry

## 2024-06-03 NOTE — Telephone Encounter (Signed)
 Patient called requesting a refill of pregabalin  medication.

## 2024-06-03 NOTE — Patient Instructions (Signed)
 Visit Information  Ms. Chiu was given information about Medicaid Managed Care team care coordination services as a part of their [facility] Community Plan Medicaid benefit.   If you would like to schedule transportation through your [facility], please call the following number at least 2 days in advance of your appointment: [phone]   Rides for urgent appointments can also be made after hours by calling Member Services.  Call the Behavioral Health Crisis Line at [phone], at any time, 24 hours a day, 7 days a week. If you are in danger or need immediate medical attention call 911.  Patient verbalizes understanding of instructions and care plan provided today and agrees to view in MyChart. Active MyChart status and patient understanding of how to access instructions and care plan via MyChart confirmed with patient.     Telephone follow up appointment with Managed Medicaid care management team member scheduled for: 06/10/2024 at 10AM.  [name], BSW [place]/VBCI - [facility] Social Worker [phone]   Following is a copy of your plan of care:   Goals Addressed             This Visit's Progress    BSW Goals   On track    Current SDOH Barriers:  Financial strain and utility disconnection notice.  Interventions: Referred patient to community resources  Provided patient with information about local utility assistance programs in [place] via email. Advised patient to call DSS (crisis intervention program), [employer], and [employer] about their financial assistance programs for us airways with disconnection.   04/23/2024 Provided patient food resources via mail and explained how out of the garden food project works.  Provide paper copy of utility resources sent via email.  Provided pt phone number to [facility] to f/u on referral for podiatry, which was placed on 10/29.  05/07/24 Patient was  provided with transportation one-pager for Medicaid transportation through [facility]. 05/20/2024 Pt was able to pay disconnection amount to avoid disconnect. Will be going to DSS to apply for help with reminder amount of bill.  BSW completed SCAT application for pt part A. Part  B will be sent to provider.  BSW and pt called [employer] re BP cuff and protein shake coverage. Pt was advised that BP monitor kit can be provided by [employer]. BSW will reach out to request instructions and provide instructions to PCP to meet this BP cuff need. As for protein shakes, medicaid will not cover them. BSW was sent an application for nutritional support through [employer]. BSW and pt will complete at next f/u.

## 2024-06-04 ENCOUNTER — Other Ambulatory Visit: Payer: Self-pay | Admitting: Family Medicine

## 2024-06-04 ENCOUNTER — Telehealth (INDEPENDENT_AMBULATORY_CARE_PROVIDER_SITE_OTHER): Admitting: Adult Health

## 2024-06-04 ENCOUNTER — Encounter (INDEPENDENT_AMBULATORY_CARE_PROVIDER_SITE_OTHER): Payer: Self-pay | Admitting: Adult Health

## 2024-06-04 VITALS — Ht 73.0 in | Wt 341.0 lb

## 2024-06-04 DIAGNOSIS — G43109 Migraine with aura, not intractable, without status migrainosus: Secondary | ICD-10-CM | POA: Diagnosis not present

## 2024-06-04 DIAGNOSIS — Z7984 Long term (current) use of oral hypoglycemic drugs: Secondary | ICD-10-CM

## 2024-06-04 DIAGNOSIS — E1169 Type 2 diabetes mellitus with other specified complication: Secondary | ICD-10-CM | POA: Diagnosis not present

## 2024-06-04 DIAGNOSIS — Z6841 Body Mass Index (BMI) 40.0 and over, adult: Secondary | ICD-10-CM

## 2024-06-04 DIAGNOSIS — K5909 Other constipation: Secondary | ICD-10-CM

## 2024-06-04 DIAGNOSIS — E559 Vitamin D deficiency, unspecified: Secondary | ICD-10-CM | POA: Diagnosis not present

## 2024-06-04 DIAGNOSIS — E669 Obesity, unspecified: Secondary | ICD-10-CM | POA: Diagnosis not present

## 2024-06-04 MED ORDER — VITAMIN D (ERGOCALCIFEROL) 1.25 MG (50000 UNIT) PO CAPS: ORAL_CAPSULE | ORAL | 1 refills | Status: AC

## 2024-06-04 MED ORDER — TOPIRAMATE 25 MG PO TABS: 25.0000 mg | ORAL_TABLET | Freq: Two times a day (BID) | ORAL | 1 refills | Status: AC

## 2024-06-04 MED ORDER — METFORMIN HCL 500 MG PO TABS: 250.0000 mg | ORAL_TABLET | Freq: Two times a day (BID) | ORAL | 1 refills | Status: AC

## 2024-06-04 MED ORDER — LINACLOTIDE 72 MCG PO CAPS: 72.0000 ug | ORAL_CAPSULE | Freq: Every day | ORAL | 1 refills | Status: AC

## 2024-06-04 NOTE — Progress Notes (Signed)
 "    WEIGHT SUMMARY AND BIOMETRICS  No data recorded Anthropometric Measurements Height: 6' 1 (1.854 m) Weight: (!) 341 lb (154.7 kg) BMI (Calculated): 45 Weight at Last Visit: 338lb Weight Lost Since Last Visit: 0lb Weight Gained Since Last Visit: 3lb Starting Weight: 443 Total Weight Loss (lbs): 102 lb (46.3 kg)   No data recorded Other Clinical Data Fasting: no Labs: no Today's Visit #: 44 Starting Date: 02/01/21    Chief Complaint:  I connected with  [name] on 06/04/2024 by a video and audio enabled telemedicine application and verified that I am speaking with the correct person using two identifiers.  Patient Location: Home  Provider Location: Office/Clinic  Persons Participating in Visit: Patient.  I discussed the limitations of evaluation and management by telemedicine. The patient expressed understanding and agreed to proceed.   Vital Signs: Because this visit was a virtual/telehealth visit, some criteria may be missing or patient reported. Any vitals not documented were not able to be obtained and vitals that have been documented are patient reported.     OBESITY Karen Brennan is here to discuss her progress with her obesity treatment plan. She is on the the Category 2 Plan and states she is following her eating plan approximately 40 % of the time.  She states she is exercising: NEAT Activities   Interim History:  03/05/2024 HWW OV: Metformin  500mg  BID was reduced to 1/2 tab BID to address CBG trending down  In office visit converted to MyChart video visit due to acute illness in family home.  05/27/2024 PCP recently replaced Ozempic  2mg  with Trulicity  3mg  to address GI upset with Ozempic  use. She reports tolerating the change without current SE  She reports episode of vertigo last week that caused a syncopal event and subsequent fall. She denies family hx or personal hx of POTS She discussed this concern with her PCP at her last f/u on  05/27/2024  Subjective:   1. Migraine with aura and without status migrainosus, not intractable Karen Brennan reports acute HA during virtual OV She denies SE with Topamax  25mg  BID  2. Vitamin D  deficiency She takes Ergocalciferol  twice weekly She endorses fatigue  3. Other constipation Constipation is well controlled with hydration and daily Linzess  - requires refill today  4. Type 2 diabetes mellitus in patient with obesity (HCC) Fasting CBG will range upper 80s to mid 90s She estimates to experience episodes of hypoglycemia 1-3 times per month. CBG will drop as low as upper 50s She will experience shakiness and immediately consume simple CHO 03/05/2024 HWW OV: Metformin  500mg  BID was reduced to 1/2 tab BID to address CBG trending down 05/27/2024 PCP recently replaced Ozempic  2mg  with Trulicity  3mg  to address GI upset with Ozempic  use. She reports tolerating the change without current SE  Assessment/Plan:   1. Migraine with aura and without status migrainosus, not intractable Refill - topiramate  (TOPAMAX ) 25 MG tablet; Take 1 tablet (25 mg total) by mouth 2 (two) times daily.  Dispense: 60 tablet; Refill: 1  2. Vitamin D  deficiency Refill - Vitamin D , Ergocalciferol , (DRISDOL ) 1.25 MG (50000 UNIT) CAPS capsule; 1 tab q wed and 1 tab q sun  Dispense: 8 capsule; Refill: 1  3. Other constipation Refill - linaclotide  (LINZESS ) 72 MCG capsule; Take 1 capsule (72 mcg total) by mouth daily before breakfast.  Dispense: 30 capsule; Refill: 1 Remain well hydrated and increase daily fiber intake  4. Type 2 diabetes mellitus in patient with obesity (HCC) Refill - metFORMIN  (  GLUCOPHAGE ) 500 MG tablet; Take 0.5 tablets (250 mg total) by mouth 2 (two) times daily with a meal.  Dispense: 30 tablet; Refill: 1  5. BMI 45.0-49.9, adult (HCC)-current bmi 46.8  Karen Brennan is currently in the action stage of change. As such, her goal is to continue with weight loss efforts. She has agreed to the  Category 2 Plan.   Exercise goals: No exercise has been prescribed at this time.  Behavioral modification strategies: increasing lean protein intake, decreasing simple carbohydrates, increasing vegetables, increasing water intake, meal planning and cooking strategies, keeping healthy foods in the home, ways to avoid boredom eating, and planning for success.  Karen Brennan has agreed to follow-up with our clinic in 4 weeks. She was informed of the importance of frequent follow-up visits to maximize her success with intensive lifestyle modifications for her multiple health conditions.   Objective:   Height 6' 1 (1.854 m), weight (!) 341 lb (154.7 kg). Body mass index is 44.99 kg/m.  General: Cooperative, alert, well developed, in no acute distress. HEENT: Conjunctivae and lids unremarkable. Cardiovascular: Regular rhythm.  Lungs: Normal work of breathing. Neurologic: No focal deficits.   Lab Results  Component Value Date   CREATININE 0.96 05/27/2024   BUN 14 05/27/2024   NA 139 05/27/2024   K 4.5 05/27/2024   CL 99 05/27/2024   CO2 26 05/27/2024   Lab Results  Component Value Date   ALT 26 05/27/2024   AST 20 05/27/2024   ALKPHOS 69 05/27/2024   BILITOT 0.7 05/27/2024   Lab Results  Component Value Date   HGBA1C 5.3 12/28/2023   HGBA1C 5.5 08/08/2023   HGBA1C 5.5 04/11/2023   HGBA1C 5.5 10/02/2022   HGBA1C 5.7 (H) 06/01/2022   Lab Results  Component Value Date   INSULIN  11.0 01/12/2022   INSULIN  22.7 02/01/2021   Lab Results  Component Value Date   TSH 1.150 04/11/2023   Lab Results  Component Value Date   CHOL 126 02/16/2023   HDL 57 02/16/2023   LDLCALC 56 02/16/2023   TRIG 62 02/16/2023   CHOLHDL 2.2 02/16/2023   Lab Results  Component Value Date   VD25OH 52.0 10/25/2023   VD25OH 42.2 08/08/2023   VD25OH 34.9 04/11/2023   Lab Results  Component Value Date   WBC 6.1 05/27/2024   HGB 14.3 05/27/2024   HCT 44.5 05/27/2024   MCV 94 05/27/2024   PLT 343  05/27/2024   Lab Results  Component Value Date   IRON 55 10/25/2023   TIBC 407 08/08/2023   FERRITIN 52 08/08/2023   Attestation Statements:   Reviewed by clinician on day of visit: allergies, medications, problem list, medical history, surgical history, family history, social history, and previous encounter notes.  I have reviewed the above documentation for accuracy and completeness, and I agree with the above. -  Riona Lahti d. Shakthi Scipio, NP-C "

## 2024-06-06 ENCOUNTER — Ambulatory Visit (HOSPITAL_COMMUNITY)
Admission: RE | Admit: 2024-06-06 | Discharge: 2024-06-06 | Disposition: A | Discharge status: Home / Self Care | Source: Ambulatory Visit | Attending: Family Medicine | Admitting: Family Medicine

## 2024-06-06 DIAGNOSIS — K8689 Other specified diseases of pancreas: Secondary | ICD-10-CM | POA: Insufficient documentation

## 2024-06-06 DIAGNOSIS — N2889 Other specified disorders of kidney and ureter: Secondary | ICD-10-CM | POA: Insufficient documentation

## 2024-06-06 MED ORDER — GADOBUTROL 1 MMOL/ML IV SOLN
10.0000 mL | Freq: Once | INTRAVENOUS | Status: AC | PRN
  Administered 2024-06-06: 10 mL via INTRAVENOUS

## 2024-06-09 NOTE — Telephone Encounter (Signed)
 Received returned call from [place] requesting status of paperwork.   Will forward to Admin team for update.   Chiquita JAYSON English, RN

## 2024-06-09 NOTE — Telephone Encounter (Signed)
 Will fax to [facility] with patient demographics.

## 2024-06-10 ENCOUNTER — Other Ambulatory Visit: Payer: Self-pay

## 2024-06-10 NOTE — Patient Outreach (Signed)
 Social Drivers of Health  Community Resource and Care Coordination Visit Note   06/10/2024  Name: JUDYE LORINO MRN: [id number] DOB:10/28/1975  Situation: Referral received for [facility] needs assessment and assistance related to utility assistance, SCAT, and nutrition program through [employer]. I obtained verbal consent from Patient.  Visit completed with Patient on the phone.   Background:   SDOH Interventions Today    Flowsheet Row Most Recent Value  SDOH Interventions   Food Insecurity Interventions Intervention Not Indicated  Housing Interventions Intervention Not Indicated  Transportation Interventions Intervention Not Indicated  Utilities Interventions Other (Comment), Community Resources Provided  [Patient plans to go to DSS to inquire about utility assistance.]     Assessment:   Goals Addressed             This Visit's Progress    BSW Goals       Current SDOH Barriers:  Financial strain and utility disconnection notice.  Interventions: Referred patient to community resources  Provided patient with information about local utility assistance programs in [place] via email. Advised patient to call DSS (crisis intervention program), [employer], and [employer] about their financial assistance programs for us airways with disconnection.   04/23/2024 Provided patient food resources via mail and explained how out of the garden food project works.  Provide paper copy of utility resources sent via email.  Provided pt phone number to [facility] to f/u on referral for podiatry, which was placed on 10/29.  05/07/24 Patient was provided with transportation one-pager for Medicaid transportation through [facility]. 05/20/2024 Pt was able to pay disconnection amount to avoid disconnect. Will be going to DSS to apply for help with reminder amount of bill.  BSW completed SCAT application for pt part A. Part  B will be sent to provider.  BSW and pt called UHC re  BP cuff and protein shake coverage. Pt was advised that BP monitor kit can be provided by [employer]. BSW will reach out to request instructions and provide instructions to PCP to meet this BP cuff need. As for protein shakes, medicaid will not cover them. BSW was sent an application for nutritional support through [employer]. BSW and pt will complete at next f/u. 06/10/2024. BSW attempted to complete [employer] program application but was unable to answer some questions. BSW will fax to PCP for completion.  Pt received communication from [facility] that her application part B was incomplete. BSW will f/u with [facility] and get clarity on this.          Recommendation:   attend all scheduled provider appointments call for transportation assistance at least one week before appointments ask for help if you don't understand your health insurance benefits Have PCP complete [employer] program application.  Follow Up Plan:   Telephone follow up appointment date/time:  06/24/2024 at 10:30Am.  [name], BSW [place]/VBCI - [employer] Social Worker [phone]

## 2024-06-10 NOTE — Patient Instructions (Signed)
 Visit Information  Karen Brennan was given information about Medicaid Managed Care team care coordination services as a part of their [facility] Medicaid benefit.   If you would like to schedule transportation through your [facility], please call the following number at least 2 days in advance of your appointment: [phone]   Rides for urgent appointments can also be made after hours by calling Member Services.  Call the Behavioral Health Crisis Line at [phone], at any time, 24 hours a day, 7 days a week. If you are in danger or need immediate medical attention call 911.  Patient verbalizes understanding of instructions and care plan provided today and agrees to view in MyChart. Active MyChart status and patient understanding of how to access instructions and care plan via MyChart confirmed with patient.     Telephone follow up appointment with Managed Medicaid care management team member scheduled for: 06/24/2024 at 10:30AM  [name], BSW [place]/VBCI - [facility] Social Worker [phone]   Following is a copy of your plan of care:   Goals Addressed             This Visit's Progress    BSW Goals       Current SDOH Barriers:  Financial strain and utility disconnection notice.  Interventions: Referred patient to community resources  Provided patient with information about local utility assistance programs in [place] via email. Advised patient to call DSS (crisis intervention program), [employer], and [employer] about their financial assistance programs for us airways with disconnection.   04/23/2024 Provided patient food resources via mail and explained how out of the garden food project works.  Provide paper copy of utility resources sent via email.  Provided pt phone number to [facility] to f/u on referral for podiatry, which was placed on 10/29.  05/07/24 Patient was provided  with transportation one-pager for Medicaid transportation through [facility]. 05/20/2024 Pt was able to pay disconnection amount to avoid disconnect. Will be going to DSS to apply for help with reminder amount of bill.  BSW completed SCAT application for pt part A. Part  B will be sent to provider.  BSW and pt called UHC re BP cuff and protein shake coverage. Pt was advised that BP monitor kit can be provided by [employer]. BSW will reach out to request instructions and provide instructions to PCP to meet this BP cuff need. As for protein shakes, medicaid will not cover them. BSW was sent an application for nutritional support through [employer]. BSW and pt will complete at next f/u. 06/10/2024. BSW attempted to complete [employer] program application but was unable to answer some questions. BSW will fax to PCP for completion.  Pt received communication from [facility] that her application part B was incomplete. BSW will f/u with [facility] and get clarity on this.

## 2024-06-13 ENCOUNTER — Telehealth: Payer: Self-pay | Admitting: Licensed Clinical Social Worker

## 2024-06-18 ENCOUNTER — Telehealth: Payer: Self-pay

## 2024-06-18 NOTE — Progress Notes (Signed)
 Complex Care Management Care Guide Note  06/18/2024 Name: Karen Brennan MRN: [id number] DOB: 1975-08-20  Karen Brennan is a 49 y.o. year old female who is a primary care patient of Hindel, Rea, MD and is actively engaged with the care management team. I reached out to Karen Brennan by phone today to assist with re-scheduling  with the RN Case Manager.  Follow up plan: Unsuccessful telephone outreach attempt made. A HIPAA compliant phone message was left for the patient providing contact information and requesting a return call.  Karen Brennan [facility], [facility] Guide  Direct Dial: [phone]  Fax [phone]

## 2024-06-19 ENCOUNTER — Other Ambulatory Visit: Payer: Self-pay

## 2024-06-19 ENCOUNTER — Encounter: Payer: Self-pay | Admitting: Family Medicine

## 2024-06-19 ENCOUNTER — Telehealth: Payer: Self-pay | Admitting: Gastroenterology

## 2024-06-19 ENCOUNTER — Ambulatory Visit: Payer: Self-pay | Admitting: Family Medicine

## 2024-06-19 VITALS — BP 123/87 | HR 75 | Wt 343.0 lb

## 2024-06-19 DIAGNOSIS — N390 Urinary tract infection, site not specified: Secondary | ICD-10-CM | POA: Diagnosis not present

## 2024-06-19 DIAGNOSIS — Z5982 Transportation insecurity: Secondary | ICD-10-CM | POA: Diagnosis not present

## 2024-06-19 DIAGNOSIS — Q453 Other congenital malformations of pancreas and pancreatic duct: Secondary | ICD-10-CM

## 2024-06-19 DIAGNOSIS — I10 Essential (primary) hypertension: Secondary | ICD-10-CM

## 2024-06-19 NOTE — Telephone Encounter (Signed)
 Dr. Wilhelmenia,  Referral for EUS in EPIC. Patient established with Dr. Federico. Please review and advise scheduling.  Thanks [employer]

## 2024-06-19 NOTE — Progress Notes (Signed)
" ° ° °  SUBJECTIVE:   CHIEF COMPLAINT / HPI:   Follow up abdominal MRI -- Abdominal imaging obtained to evaluate recurrent UTIs --CT abdomen pelvis unrevealing for urinary tract however showed renal and pancreatic abnormalities --Follow-up abdominal MRI recommended, this showed nonsuspicious renal lesions however also showed severe parenchymal atrophy and ductal dilation in the distal pancreatic tail, concern for neoplasm versus sequelae of prior pancreatitis and ductal stricture. Consider EUS/FNA to further evaluate --Patient has not had abdominal pain, jaundice, weight loss -- Discussed MRI report with patient and need for further evaluation, patient voiced understanding  Recurrent UTIs --Saw urology recently, they told her that there was nothing concerning about the urinary system on imaging --taking keflex  500mg  daily for 6 months --will go back to urology next week for ?cystoscopy   Hypertension Transportation insecurity Obesity -- Working with case production designer, theatre/television/film through [employer], has not heard about blood pressure cuff or SCAT application -- Received paperwork from child psychotherapist about [employer], patient has been using protein shakes for meal replacement as part of her weight loss regimen -- Switched to Trulicity  after last visit, patient has taken 2 injections thus far and reports no concerns  PERTINENT  PMH / PSH: reviewed  OBJECTIVE:   BP 123/87   Pulse 75   Wt (!) 343 lb (155.6 kg)   SpO2 99%   BMI 45.25 kg/m   - General: No acute distress. Awake and conversant. - Eyes: Normal conjunctiva, anicteric. Round symmetric pupils. - ENT: Hearing grossly intact. No nasal discharge. - Neck: Neck is supple. No masses or thyromegaly. - Respiratory: Respirations are non-labored. - Skin: No visible rashes or ulcers. - Psych: Alert and oriented. Cooperative, Appropriate mood and affect, Normal judgment. - MSK: Normal ambulation. - Neuro: Sensation and CN II-XII grossly  normal.   ASSESSMENT/PLAN:   Assessment & Plan Pancreatic ductal abnormality Reviewed MRI abdomen report with the patient.  Unfortunately, concern for pancreatic duct neoplasm.  Sent urgent referral to GI for evaluation, consideration of EUS/FNA. Patient to follow-up with me in 2 weeks to check-in. Recurrent UTI Stable, patient now on daily Keflex  for prophylaxis per urology.  Believes she will be having cystoscopy at urology appointment next week. Morbid obesity (HCC)-starting bmi 58.45/date 02/01/21 Completing [employer] patient assistance application so that patient can receive protein shakes to use his meal replacement assist with her weight loss.  Patient is doing well on Trulicity , will discuss further at next visit and consider increase to higher dose. Hypertension, unspecified type Transportation insecurity Patient has not heard about a blood pressure cuff yet and reports there were some issues with her SCAT application, sent message to social worker to check on this.     Rea Raring, MD [facility] "

## 2024-06-19 NOTE — Patient Instructions (Signed)
 Thank you for coming in today! Here is a summary of what we discussed:  -I sent a referral to GI, please let me know if you haven't heard from them in the next week  -I will contact the social worker about the BP cuff and bus form  -Please follow up in 2 weeks   Please call the clinic at [phone] if your symptoms worsen or you have any concerns.  Best, Dr Adele

## 2024-06-19 NOTE — Telephone Encounter (Signed)
 EUS has been entered for 07/11/24 9 am at [facility] with GM

## 2024-06-19 NOTE — Telephone Encounter (Signed)
 EUS scheduled, pt instructed and medications reviewed.  Patient instructions mailed to home and sent to My Chart.  Patient to call with any questions or concerns.  Pt will come in for labs as soon as able

## 2024-06-19 NOTE — Telephone Encounter (Signed)
 Karen Brennan, Lets go ahead and plan an upper EUS. Find a slot on my hospital week at the end of the month for upper EUS. Have her come in for a CA 19-9 (placed under Dr. Lafonda name since I will be away next week). Thanks. GM

## 2024-06-19 NOTE — Assessment & Plan Note (Signed)
 Patient has not heard about a blood pressure cuff yet and reports there were some issues with her SCAT application, sent message to social worker to check on this.

## 2024-06-19 NOTE — Patient Outreach (Signed)
 BSW called patient to provide update on BP cuff and SCAT application. Pt and PCP were advised that BP cuff is a pharmacy benefit by [employer] and that prescription can be sent to pt's local pharmacy for the BP cuff and patient can pick it up at her pharmacy. Regarding, SCAT pt was advised BSW is working to get part B of the application so he can add her signature and resend for processing.

## 2024-06-19 NOTE — Telephone Encounter (Signed)
 Lab order has been entered under Dr Federico

## 2024-06-20 ENCOUNTER — Telehealth: Admitting: *Deleted

## 2024-06-23 ENCOUNTER — Ambulatory Visit (INDEPENDENT_AMBULATORY_CARE_PROVIDER_SITE_OTHER): Admitting: Podiatry

## 2024-06-23 DIAGNOSIS — G5790 Unspecified mononeuropathy of unspecified lower limb: Secondary | ICD-10-CM

## 2024-06-23 DIAGNOSIS — E1142 Type 2 diabetes mellitus with diabetic polyneuropathy: Secondary | ICD-10-CM | POA: Diagnosis not present

## 2024-06-23 MED ORDER — PREGABALIN 100 MG PO CAPS: 100.0000 mg | ORAL_CAPSULE | Freq: Three times a day (TID) | ORAL | 6 refills | Status: AC

## 2024-06-23 NOTE — Progress Notes (Signed)
 Presents today follow-up neuropathy secondary to diabetes.  Doing better the burning station is returned improved considerably with the Lyrica .  Still has numbness.  No troubles taking the Lyrica .   Physical exam:  General appearance: Pleasant, and in no acute distress. AOx3.  Vascular: Pedal pulses: DP 2/4 bilaterally, PT 2/4 bilaterally.  Moderate edema lower legs bilaterally. Capillary fill time immediate.  Neurological: Decreased vibratory sensation feet bilaterally.  Burning feet bilaterally significantly decreased since taking Lyrica -decree sensation in feet  Dermatologic:   Skin normal temperature bilaterally.  Skin normal color, tone, and texture bilaterally.   Musculoskeletal: Hammertoe deformities 2 through 5 bilaterally    Diagnosis: 1.  Neuritis feet bilaterally 2.  Peripheral diabetic neuropathy type II bilaterally  Plan: -Stemetil's visit for evaluation and management level 3. - Doing well on the Lyrica .  Will continue Lyrica  100 mg 3 times daily.  Told her the numbness is something she will probably have a long-term.  The good thing is the discomfort from the burning and other symptoms seem to have resolved pretty much -Lyrica  100 mg, 1 p.o. 3 times daily, 6 refills  Return 6 months follow-up diabetic neuropathy

## 2024-06-24 ENCOUNTER — Other Ambulatory Visit: Payer: Self-pay

## 2024-06-24 ENCOUNTER — Other Ambulatory Visit: Payer: Self-pay | Admitting: Family Medicine

## 2024-06-24 MED ORDER — ACCU-CHEK GUIDE W/DEVICE KIT: PACK | 0 refills | Status: AC

## 2024-06-24 NOTE — Patient Outreach (Signed)
 Social Drivers of Health  Community Resource and Care Coordination Visit Note   06/24/2024  Name: Karen Brennan MRN: [id number] DOB:11/21/1975  Situation: Referral received for [facility] needs assessment and assistance related to utility. I obtained verbal consent from Patient.  Visit completed with Patient on the phone.   Background:   SDOH Interventions Today    Flowsheet Row Most Recent Value  SDOH Interventions   Food Insecurity Interventions Intervention Not Indicated  Housing Interventions Intervention Not Indicated  Transportation Interventions Intervention Not Indicated  Utilities Interventions Other (Comment)  [patient is in the process of applying for emergency utility assistance with DSS through crisis intervention program. Turned in daughter paystubs today.]     Assessment:   Goals Addressed             This Visit's Progress    BSW Goals       Current SDOH Barriers:  Financial strain and utility disconnection notice.  Interventions: Referred patient to community resources  Provided patient with information about local utility assistance programs in [place] via email. Advised patient to call DSS (crisis intervention program), [employer], and [employer] about their financial assistance programs for us airways with disconnection.   04/23/2024 Provided patient food resources via mail and explained how out of the garden food project works.  Provide paper copy of utility resources sent via email.  Provided pt phone number to [facility] to f/u on referral for podiatry, which was placed on 10/29.  05/07/24 Patient was provided with transportation one-pager for Medicaid transportation through [facility]. 05/20/2024 Pt was able to pay disconnection amount to avoid disconnect. Will be going to DSS to apply for help with reminder amount of bill.  BSW completed SCAT application for pt part A. Part  B will be sent to provider.  BSW and pt called UHC re BP cuff  and protein shake coverage. Pt was advised that BP monitor kit can be provided by [employer]. BSW will reach out to request instructions and provide instructions to PCP to meet this BP cuff need. As for protein shakes, medicaid will not cover them. BSW was sent an application for nutritional support through [employer]. BSW and pt will complete at next f/u. 06/10/2024. BSW attempted to complete [employer] program application but was unable to answer some questions. BSW will fax to PCP for completion.  Pt received communication from [facility] that her application part B was incomplete. BSW will f/u with [facility] and get clarity on this. 06/24/2024 BSW provided pt [facility] phone number ot f/u on application.  BSW will f/u with Dr. Adele about BP cuff script.  Pt requested counseling resources. BSW will message scott and adivsed patient to not miss that appt with him and make him aware of her counseling needs.          Recommendation:   attend all scheduled provider appointments call for transportation assistance at least one week before appointments ask for help if you don't understand your health insurance benefits  Follow Up Plan:   Telephone follow up appointment date/time:  07/08/2024 at 10AM.   [name], BSW /VBCI - [facility] Social Worker [phone]

## 2024-06-24 NOTE — Patient Instructions (Signed)
 Visit Information  Karen Brennan was given information about Medicaid Managed Care team care coordination services as a part of their [facility] Community Plan Medicaid benefit.   If you would like to schedule transportation through your [facility], please call the following number at least 2 days in advance of your appointment: [phone]   Rides for urgent appointments can also be made after hours by calling Member Services.  Call the Behavioral Health Crisis Line at [phone], at any time, 24 hours a day, 7 days a week. If you are in danger or need immediate medical attention call 911.   Patient verbalizes understanding of instructions and care plan provided today and agrees to view in MyChart. Active MyChart status and patient understanding of how to access instructions and care plan via MyChart confirmed with patient.     Telephone follow up appointment with Managed Medicaid care management team member scheduled for: 07/08/2024 at 10AM.   [name], BSW [place]/VBCI - [facility] Social Worker [phone]   Following is a copy of your plan of care:   Goals Addressed             This Visit's Progress    BSW Goals       Current SDOH Barriers:  Financial strain and utility disconnection notice.  Interventions: Referred patient to community resources  Provided patient with information about local utility assistance programs in [place] via email. Advised patient to call DSS (crisis intervention program), [employer], and [employer] about their financial assistance programs for us airways with disconnection.   04/23/2024 Provided patient food resources via mail and explained how out of the garden food project works.  Provide paper copy of utility resources sent via email.  Provided pt phone number to [facility] to f/u on referral for podiatry, which was placed on 10/29.  05/07/24 Patient was provided  with transportation one-pager for Medicaid transportation through [facility]. 05/20/2024 Pt was able to pay disconnection amount to avoid disconnect. Will be going to DSS to apply for help with reminder amount of bill.  BSW completed SCAT application for pt part A. Part  B will be sent to provider.  BSW and pt called UHC re BP cuff and protein shake coverage. Pt was advised that BP monitor kit can be provided by [employer]. BSW will reach out to request instructions and provide instructions to PCP to meet this BP cuff need. As for protein shakes, medicaid will not cover them. BSW was sent an application for nutritional support through [employer]. BSW and pt will complete at next f/u. 06/10/2024. BSW attempted to complete [employer] nutrition program application but was unable to answer some questions. BSW will fax to PCP for completion.  Pt received communication from [facility] that her application part B was incomplete. BSW will f/u with [facility] and get clarity on this. 06/24/2024 BSW provided pt [employer] [facility] phone number ot f/u on application.  BSW will f/u with Dr. Adele about BP cuff script.  Pt requested counseling resources. BSW will message scott and adivsed patient to not miss that appt with him and make him aware of her counseling needs.

## 2024-06-25 ENCOUNTER — Other Ambulatory Visit: Payer: Self-pay | Admitting: Licensed Clinical Social Worker

## 2024-06-25 ENCOUNTER — Telehealth: Payer: Self-pay | Admitting: *Deleted

## 2024-06-25 NOTE — Patient Instructions (Signed)
 Visit Information  Thank you for taking time to visit with me today. Please don't hesitate to contact me if I can be of assistance to you before our next scheduled appointment.  Our next appointment is by telephone on 07/29/2024 at 3:45 PM   Please call the care guide team at [phone] if you need to cancel or reschedule your appointment.   Following is a copy of your care plan:   Goals Addressed             This Visit's Progress    VBCI Social Work Care Plan   On track    Problems:   Biomedical scientist             Food needs             Difficulty paying for medications             Anxiety             Depression   CSW Clinical Goal(s):   Over the next 30 days the Patient will attend all scheduled medical appointments as evidenced by patient report and care team review of appointment completion in electronic medical record .           Over next 30 days Karen Brennan will attend scheduled mental health appointments  at [facility] with counselor and psychiatrist AEB patient report of attending scheduled mental health appointments at [facility]  Interventions:              Spoke with client about her current needs and status               Spoke about home environment. She lives at home with her 3 children.  One daughter has Down's Syndrome                 Spoke about transport needs; spoke about medication procurement. She said that sometimes it is hard to pay for her medications                Discussed financial needs.  She said she goes to [employer] occasionally for food help. She also receives Food stamps benefit               Discussed support from PCP              Discussed support from cardiologist             Reviewed pain issues. She spoke left leg pain, back pain and history of UTIs             Discussed ambulation. She get SOB sometimes when walking. She spoke of left leg pain. She said she has some swelling in her feet. She said she gets dizzy occasionally. She said when she goes  shopping she sometimes rides in a buggy to help her shop. She said home health aide sometimes does some shopping for [facility]               Completed assessments as needed.  Completed GAD-7; Completed  PHQ 2/9               Discussed mood of client . She said she used to go to [facility] for mental health support. She said she is going to contact [facility] again to talk with agency about mental health support. She spoke of past support from psychiatrist at agency. She also wants to talk with counselor at agency, if possible, about managing her health needs at  present             Discussed program support with RN, LCSW, Pharmacist            Discussed support with RN Nichole Jobs            Discussed SW support with BSW, [place]            Provided counseling support           She said she recently had CT scan and MRI. 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 She said she is scheduled soon to have endoscopy test            She said 3 daughters reside with her and are supportive.             Regarding finances, she went to DSS recently to seek help with utility bill. She has cut off notice from utility company for 07/01/2024. She said she is waiting to get letter from DSS regarding possible utility help for her utility bill payment            She spoke of back pain, of leg pain, of headache pain issues           She spoke of swelling in her feet           She said since she has gone to [place] in past for mental health support she wants to continue to get mental health support through [facility]           Encouraged Karen Brennan to call LCSW as needed for SW support at [phone]             Client was appreciative of call from LCSW today  Patient Goals/Self-Care Activities:  Attend scheduled PCP appointments            Attend scheduled mental health appointments at [facility] in [place], [place]             Take medications as prescribed               Contact local food pantries as needed for extra food  support            Communicate as needed with RN Nichole Jobs for nursing support            Call LCSW as needed for SW support            Attend appointment with podiatrist as scheduled  Plan:   Telephone follow up appointment with care management team member scheduled for:  07/29/2024 at 3:45 PM         Please go to [facility] [address] [place] [phone]) if you are experiencing a Mental Health or Behavioral Health Crisis or need someone to talk to.  Patient verbalized understanding of Care plan and visit instructions communicated this visit  Karen Brennan  MSW, LCSW [place]/Value Based Care [facility] Licensed Clinical Social Worker Direct Dial:  [phone] Fax:  [phone] Website:  [email or web address]

## 2024-06-25 NOTE — Patient Outreach (Signed)
 Complex Care Management   Visit Note  06/25/2024  Name:  Karen Brennan MRN: [id number] DOB: 10/02/1975  Situation: Referral received for Complex Care Management related to mental health needs I obtained verbal consent from Patient.  Visit completed with Patient  on the phone  Background:   Past Medical History:  Diagnosis Date   Anxiety    Asthma    B12 deficiency 08/29/2021   Bipolar 1 disorder (HCC)    Depression    GERD (gastroesophageal reflux disease)    History of bronchitis    Hypertension    Kidney stones 10/09/2011   Migraines 10/09/2011   often   Morbid obesity with BMI of 50.0-59.9, adult (HCC) 08/09/2006   Qualifier: Diagnosis of   By: Joyice MD, Jessica         Schizophrenia [facility])     Assessment: Patient Reported Symptoms:  Cognitive Cognitive Status: Alert and oriented to person, place, and time, Able to follow simple commands Cognitive/Intellectual Conditions Management [RPT]: None reported or documented in medical history or problem list   Health Maintenance Behaviors: Stress management Health Facilitated by: Stress management  Neurological Neurological Review of Symptoms: Dizziness, Headaches Neurological Management Strategies: Coping strategies  HEENT HEENT Symptoms Reported: Nasal discharge HEENT Management Strategies: Coping strategies    Cardiovascular Cardiovascular Symptoms Reported: Dizziness, Fatigue Cardiovascular Management Strategies: Coping strategies, Adequate rest  Respiratory Respiratory Symptoms Reported: Shortness of breath Other Respiratory Symptoms: SOB occasionally when she walks Respiratory Management Strategies: Coping strategies  Endocrine Endocrine Symptoms Reported: Weakness or fatigue, Shortness of breath, Headaches, Increased thirst, Nausea or vomiting Is patient diabetic?: Yes    Gastrointestinal Gastrointestinal Symptoms Reported: Constipation Gastrointestinal Management Strategies: Coping strategies, Adequate rest     Genitourinary Genitourinary Symptoms Reported: Frequency Other Genitourinary Symptoms: hx of UTIs Genitourinary Management Strategies: Coping strategies  Integumentary Integumentary Symptoms Reported: Skin changes Other Integumentary Symptoms: dry skin . Has appointment scheduled with Dermatologist Skin Management Strategies: Coping strategies  Musculoskeletal Musculoskelatal Symptoms Reviewed: Unsteady gait, Weakness, Limited mobility, Muscle pain Other Musculoskeletal Symptoms: Has back pain issues Musculoskeletal Management Strategies: Coping strategies      Psychosocial Psychosocial Symptoms Reported: Sadness - if selected complete PHQ 2-9, Anxiety - if selected complete GAD, Depression - if selected complete PHQ 2-9 Additional Psychological Details: Anxiety; concern over recent medical diagnosis. [facility]. client has received support for mental health needs with [facility] in the past Behavioral Management Strategies: Coping strategies Major Change/Loss/Stressor/Fears (CP): Medical condition, self Techniques to Cope with Loss/Stress/Change: Counseling, Diversional activities Quality of Family Relationships: supportive Do you feel physically threatened by others?: No Client is anxious regarding recent medical diagnosis. Interested in receiving mental health support through [facility] agency    06/25/2024    PHQ2-9 Depression Screening   Little interest or pleasure in doing things More than half the days  Feeling down, depressed, or hopeless More than half the days  PHQ-2 - Total Score 4  Trouble falling or staying asleep, or sleeping too much Several days  Feeling tired or having little energy Several days  Poor appetite or overeating  Several days  Feeling bad about yourself - or that you are a failure or have let yourself or your family down Several days  Trouble concentrating on things, such as reading the newspaper or  watching television Several days  Moving or speaking so slowly that other people could have noticed.  Or the opposite - being so fidgety or restless that you have  been moving around a lot more than usual Several days  Thoughts that you would be better off dead, or hurting yourself in some way Not at all  PHQ2-9 Total Score 10  If you checked off any problems, how difficult have these problems made it for you to do your work, take care of things at home, or get along with other people Somewhat difficult  Depression Interventions/Treatment Counseling    Today's Vitals  BP within normal range per client information  Pain Scale: 0-10 Pain Score: 5  Pain Type: Chronic pain Pain Location: Generalized Pain Descriptors / Indicators: Aching Patients Stated Pain Goal: 1 Pain Intervention(s): Relaxation  Medications Reviewed Today   Medications were not reviewed in this encounter     Recommendation:   PCP Follow-up Continue Current Plan of Care Call LCSW as needed for SW support Call RN Nichole Jobs as needed for nursing support Take medications as prescribed Attend mental health appointments as scheduled at [facility] Allow time for rest and relaxation  Follow Up Plan:   Telephone follow up appointment date/time:  07/29/2024 at 3:45 PM    Glendia Pear  MSW, LCSW [place]/Value Based Care [facility] Licensed Clinical Social Worker Direct Dial:  [phone] Fax:  [phone] Website:  [email or web address]

## 2024-06-26 ENCOUNTER — Ambulatory Visit

## 2024-06-26 ENCOUNTER — Telehealth: Payer: Self-pay | Admitting: Licensed Clinical Social Worker

## 2024-06-30 ENCOUNTER — Other Ambulatory Visit (INDEPENDENT_AMBULATORY_CARE_PROVIDER_SITE_OTHER)

## 2024-06-30 DIAGNOSIS — Q453 Other congenital malformations of pancreas and pancreatic duct: Secondary | ICD-10-CM | POA: Diagnosis not present

## 2024-07-01 ENCOUNTER — Ambulatory Visit: Payer: Self-pay | Admitting: Internal Medicine

## 2024-07-01 LAB — CANCER ANTIGEN 19-9: CA 19-9: 7 U/mL

## 2024-07-03 ENCOUNTER — Other Ambulatory Visit (HOSPITAL_COMMUNITY): Payer: Self-pay

## 2024-07-08 ENCOUNTER — Other Ambulatory Visit: Payer: Self-pay

## 2024-07-08 ENCOUNTER — Telehealth: Payer: Self-pay

## 2024-07-08 ENCOUNTER — Other Ambulatory Visit (HOSPITAL_COMMUNITY): Payer: Self-pay

## 2024-07-08 NOTE — Patient Outreach (Signed)
 Social Drivers of Health  Community Resource and Care Coordination Visit Note   07/08/2024  Name: CORINE SOLORIO MRN: [id number] DOB:14-Jul-1975  Situation: Referral received for [facility] needs assessment and assistance related to utility assistance. I obtained verbal consent from Patient.  Visit completed with Patient on the phone.   Background:   SDOH Interventions Today    Flowsheet Row Most Recent Value  SDOH Interventions   Food Insecurity Interventions Community Resources Provided  [BSW will enroll patient into UHC fruits and veggies benefit this afternoon.]  Housing Interventions Intervention Not Indicated  Transportation Interventions Other (Comment)  [patient recently received letter in the mail from [employer] [facility] stating her application is being processed.]  Utilities Interventions Other (Comment)  [Patient is waiting to hear back from DSS regarding energy assistance application outcome.]  Financial Strain Interventions Intervention Not Indicated     Assessment:   Goals Addressed             This Visit's Progress    BSW Goals       Current SDOH Barriers:  Financial strain and utility disconnection notice.  Interventions: Referred patient to community resources  Provided patient with information about local utility assistance programs in [place] via email. Advised patient to call DSS (crisis intervention program), [employer], and [employer] about their financial assistance programs for us airways with disconnection.   04/23/2024 Provided patient food resources via mail and explained how out of the garden food project works.  Provide paper copy of utility resources sent via email.  Provided pt phone number to [facility] to f/u on referral for podiatry, which was placed on 10/29.  05/07/24 Patient was provided with transportation one-pager for Medicaid transportation through [facility]. 05/20/2024 Pt was able to pay disconnection amount to avoid disconnect.  Will be going to DSS to apply for help with reminder amount of bill.  BSW completed SCAT application for pt part A. Part  B will be sent to provider.  BSW and pt called UHC re BP cuff and protein shake coverage. Pt was advised that BP monitor kit can be provided by [employer]. BSW will reach out to request instructions and provide instructions to PCP to meet this BP cuff need. As for protein shakes, medicaid will not cover them. BSW was sent an application for nutritional support through [employer]. BSW and pt will complete at next f/u. 06/10/2024. BSW attempted to complete [employer] nutrition program application but was unable to answer some questions. BSW will fax to PCP for completion.  Pt received communication from [employer] [facility] that her application part B was incomplete. BSW will f/u with [facility] and get clarity on this. 06/24/2024 BSW provided pt [employer] [facility] phone number ot f/u on application.  BSW will f/u with Dr. Adele about BP cuff script.  Pt requested counseling resources. BSW will message scott and adivsed patient to not miss that appt with him and make him aware of her counseling needs 07/08/2024 BSW will help patient enroll into fruits and veggies benefit with [facility].  BSW will provide additional food resources.  Pt is waiting on energy assistance application outcome from DSS.  SCAT application is being processed.  Pt plans to get set up with counseling services with [facility].           Recommendation:   attend all scheduled provider appointments call for transportation assistance at least one week before appointments ask for help if you don't understand your health insurance benefits call  and/or follow up with food resources  for food assistance  Follow Up Plan:   Telephone follow up appointment date/time:  07/22/2024 at 11Am.   [name], BSW [place]/VBCI - [employer] Social Worker [phone]

## 2024-07-08 NOTE — Patient Instructions (Signed)
 Visit Information  Karen Brennan was given information about Medicaid Managed Care team care coordination services as a part of their [facility] Community Plan Medicaid benefit.   If you would like to schedule transportation through your [facility] please call the following number at least 2 days in advance of your appointment: [phone]   Rides for urgent appointments can also be made after hours by calling [employer].  Call the Behavioral Health Crisis Line at [phone], at any time, 24 hours a day, 7 days a week. If you are in danger or need immediate medical attention call 911.  Patient verbalizes understanding of instructions and care plan provided today and agrees to view in MyChart. Active MyChart status and patient understanding of how to access instructions and care plan via MyChart confirmed with patient.     Social Worker will help applicant enroll into [facility] wellness benefit and Telephone follow up appointment with Managed Medicaid care management team member scheduled for: 07/22/2024 at 11Am.   [name], BSW [place]/VBCI - [employer] Social Worker [phone]   Following is a copy of your plan of care:   Goals Addressed             This Visit's Progress    BSW Goals       Current SDOH Barriers:  Financial strain and utility disconnection notice.  Interventions: Referred patient to community resources  Provided patient with information about local utility assistance programs in [place] via email. Advised patient to call DSS (crisis intervention program), [employer], and [employer] about their financial assistance programs for us airways with disconnection.   04/23/2024 Provided patient food resources via mail and explained how out of the garden food project works.  Provide paper copy of utility resources sent via email.  Provided pt phone number to [facility] to f/u on referral for  podiatry, which was placed on 10/29.  05/07/24 Patient was provided with transportation one-pager for Medicaid transportation through [facility] 05/20/2024 Pt was able to pay disconnection amount to avoid disconnect. Will be going to DSS to apply for help with reminder amount of bill.  BSW completed SCAT application for pt part A. Part  B will be sent to provider.  BSW and pt called UHC re BP cuff and protein shake coverage. Pt was advised that BP monitor kit can be provided by [employer]. BSW will reach out to request instructions and provide instructions to PCP to meet this BP cuff need. As for protein shakes, medicaid will not cover them. BSW was sent an application for nutritional support through [employer]. BSW and pt will complete at next f/u. 06/10/2024. BSW attempted to complete [employer] nutrition program application but was unable to answer some questions. BSW will fax to PCP for completion.  Pt received communication from [employer] [facility] that her application part B was incomplete. BSW will f/u with [facility] and get clarity on this. 06/24/2024 BSW provided pt [employer] [facility] phone number ot f/u on application.  BSW will f/u with Dr. Adele about BP cuff script.  Pt requested counseling resources. BSW will message scott and adivsed patient to not miss that appt with him and make him aware of her counseling needs 07/08/2024 BSW will help patient enroll into fruits and veggies benefit with [facility].  BSW will provide additional food resources.  Pt is waiting on energy assistance application outcome from DSS.  SCAT application is being processed.  Pt plans to get  set up with counseling services with [facility].

## 2024-07-11 ENCOUNTER — Ambulatory Visit (HOSPITAL_COMMUNITY): Admitting: Anesthesiology

## 2024-07-11 ENCOUNTER — Encounter (HOSPITAL_COMMUNITY)
Admission: RE | Disposition: A | Discharge status: Home / Self Care | Payer: Self-pay | Source: Home / Self Care | Attending: Gastroenterology

## 2024-07-11 ENCOUNTER — Encounter (HOSPITAL_COMMUNITY): Payer: Self-pay | Admitting: Gastroenterology

## 2024-07-11 ENCOUNTER — Other Ambulatory Visit: Payer: Self-pay

## 2024-07-11 ENCOUNTER — Ambulatory Visit (HOSPITAL_COMMUNITY)
Admission: RE | Admit: 2024-07-11 | Discharge: 2024-07-11 | Disposition: A | Discharge status: Home / Self Care | Attending: Gastroenterology | Admitting: Gastroenterology

## 2024-07-11 DIAGNOSIS — F419 Anxiety disorder, unspecified: Secondary | ICD-10-CM | POA: Diagnosis not present

## 2024-07-11 DIAGNOSIS — J45909 Unspecified asthma, uncomplicated: Secondary | ICD-10-CM | POA: Insufficient documentation

## 2024-07-11 DIAGNOSIS — K8689 Other specified diseases of pancreas: Secondary | ICD-10-CM | POA: Insufficient documentation

## 2024-07-11 DIAGNOSIS — I10 Essential (primary) hypertension: Secondary | ICD-10-CM

## 2024-07-11 DIAGNOSIS — Q453 Other congenital malformations of pancreas and pancreatic duct: Secondary | ICD-10-CM

## 2024-07-11 DIAGNOSIS — F418 Other specified anxiety disorders: Secondary | ICD-10-CM | POA: Diagnosis not present

## 2024-07-11 DIAGNOSIS — Z87891 Personal history of nicotine dependence: Secondary | ICD-10-CM

## 2024-07-11 DIAGNOSIS — E119 Type 2 diabetes mellitus without complications: Secondary | ICD-10-CM | POA: Diagnosis not present

## 2024-07-11 DIAGNOSIS — Z9884 Bariatric surgery status: Secondary | ICD-10-CM | POA: Diagnosis not present

## 2024-07-11 DIAGNOSIS — R519 Headache, unspecified: Secondary | ICD-10-CM | POA: Insufficient documentation

## 2024-07-11 DIAGNOSIS — K219 Gastro-esophageal reflux disease without esophagitis: Secondary | ICD-10-CM | POA: Diagnosis not present

## 2024-07-11 DIAGNOSIS — Z7984 Long term (current) use of oral hypoglycemic drugs: Secondary | ICD-10-CM | POA: Diagnosis not present

## 2024-07-11 DIAGNOSIS — F319 Bipolar disorder, unspecified: Secondary | ICD-10-CM | POA: Diagnosis not present

## 2024-07-11 DIAGNOSIS — Z980 Intestinal bypass and anastomosis status: Secondary | ICD-10-CM | POA: Diagnosis not present

## 2024-07-11 DIAGNOSIS — Z6841 Body Mass Index (BMI) 40.0 and over, adult: Secondary | ICD-10-CM | POA: Diagnosis not present

## 2024-07-11 DIAGNOSIS — E66813 Obesity, class 3: Secondary | ICD-10-CM | POA: Insufficient documentation

## 2024-07-11 DIAGNOSIS — I899 Noninfective disorder of lymphatic vessels and lymph nodes, unspecified: Secondary | ICD-10-CM

## 2024-07-11 LAB — GLUCOSE, CAPILLARY
Glucose-Capillary: 82 mg/dL (ref 70–99)
Glucose-Capillary: 77 mg/dL (ref 70–99)

## 2024-07-11 MED ORDER — PROPOFOL 500 MG/50ML IV EMUL
INTRAVENOUS | Status: DC | PRN
  Administered 2024-07-11: 25 mg via INTRAVENOUS
  Administered 2024-07-11: 115 ug/kg/min via INTRAVENOUS

## 2024-07-11 MED ORDER — SODIUM CHLORIDE 0.9 % IV SOLN: INTRAVENOUS | Status: DC

## 2024-07-11 MED ORDER — CIPROFLOXACIN IN D5W 400 MG/200ML IV SOLN
INTRAVENOUS | Status: AC
  Filled 2024-07-11: qty 200

## 2024-07-11 NOTE — Discharge Instructions (Signed)

## 2024-07-11 NOTE — H&P (Signed)
 "  GASTROENTEROLOGY PROCEDURE H&P NOTE   Primary Care Physician: Adele Song, MD  HPI: Karen Brennan is a 49 y.o. female who presents for EGD/EUS to evaluate for pancreatic ductal dilation.  Past Medical History:  Diagnosis Date   Anxiety    Asthma    B12 deficiency 08/29/2021   Bipolar 1 disorder (HCC)    Depression    GERD (gastroesophageal reflux disease)    History of bronchitis    Hypertension    Kidney stones 10/09/2011   Migraines 10/09/2011   often   Morbid obesity with BMI of 50.0-59.9, adult (HCC) 08/09/2006   Qualifier: Diagnosis of   By: Joyice MD, Jessica         Schizophrenia [facility])    Past Surgical History:  Procedure Laterality Date   ANKLE FRACTURE SURGERY   1990's   left; put screws in   ENDOMETRIAL ABLATION  ~ 2011   FRACTURE SURGERY     GASTRIC BY PASS     TUBAL LIGATION  2006   Current Facility-Administered Medications  Medication Dose Route Frequency Provider Last Rate Last Admin   0.9 %  sodium chloride  infusion   Intravenous Continuous Mansouraty, Aloha Raddle., MD       Current Medications[1] Allergies[2] Family History  Problem Relation Age of Onset   Heart failure Mother    Hyperlipidemia Mother    Hypertension Mother    Diabetes Mother    Sudden death Mother    Depression Mother    Bipolar disorder Mother    Schizophrenia Mother    Hypertension Father    Colon cancer Neg Hx    Esophageal cancer Neg Hx    Rectal cancer Neg Hx    Stomach cancer Neg Hx    Social History   Socioeconomic History   Marital status: Single    Spouse name: Not on file   Number of children: Not on file   Years of education: Not on file   Highest education level: Not on file  Occupational History   Occupation: DISABLE  Tobacco Use   Smoking status: Former    Current packs/day: 0.00    Average packs/day: 0.3 packs/day for 4.0 years (1.0 ttl pk-yrs)    Types: Cigarettes    Start date: 06/12/2000    Quit date: 06/12/2004    Years since quitting:  20.0   Smokeless tobacco: Never  Vaping Use   Vaping status: Never Used  Substance and Sexual Activity   Alcohol use: No   Drug use: No   Sexual activity: Not Currently    Birth control/protection: Other-see comments    Comment: Novasure  Other Topics Concern   Not on file  Social History Narrative   Not on file   Social Drivers of Health   Tobacco Use: Medium Risk (07/11/2024)   Patient History    Smoking Tobacco Use: Former    Smokeless Tobacco Use: Never    Passive Exposure: Not on Actuary Strain: High Risk (07/08/2024)   Overall Financial Resource Strain (CARDIA)    Difficulty of Paying Living Expenses: Hard  Food Insecurity: No Food Insecurity (07/08/2024)   Epic    Worried About Radiation Protection Practitioner of Food in the Last Year: Never true    Ran Out of Food in the Last Year: Never true  Recent Concern: Food Insecurity - Food Insecurity Present (05/20/2024)   Epic    Worried About Radiation Protection Practitioner of Food in the Last Year: Sometimes true  Ran Out of Food in the Last Year: Sometimes true  Transportation Needs: No Transportation Needs (07/08/2024)   Epic    Lack of Transportation (Medical): No    Lack of Transportation (Non-Medical): No  Physical Activity: Not on file  Stress: Stress Concern Present (06/25/2024)   [employer] of Occupational Health - Occupational Stress Questionnaire    Feeling of Stress: To some extent  Social Connections: Not on file  Intimate Partner Violence: Not At Risk (07/08/2024)   Epic    Fear of Current or Ex-Partner: No    Emotionally Abused: No    Physically Abused: No    Sexually Abused: No  Depression (PHQ2-9): Medium Risk (06/25/2024)   Depression (PHQ2-9)    PHQ-2 Score: 10  Alcohol Screen: Not on file  Housing: Low Risk (07/08/2024)   Epic    Unable to Pay for Housing in the Last Year: No    Number of Times Moved in the Last Year: 0    Homeless in the Last Year: No  Utilities: At Risk (07/08/2024)   Epic    Threatened with  loss of utilities: Yes  Health Literacy: Not on file    Physical Exam: Today's Vitals   07/11/24 0814  BP: (!) 134/96  Pulse: 81  Resp: 18  Temp: (!) 97 F (36.1 C)  TempSrc: Temporal  SpO2: 100%  Weight: (!) 154.2 kg  Height: 6' 1 (1.854 m)   Body mass index is 44.86 kg/m. GEN: NAD EYE: Sclerae anicteric ENT: MMM CV: Non-tachycardic GI: Soft, NT/ND NEURO:  Alert & Oriented x 3  Lab Results: No results for input(s): WBC, HGB, HCT, PLT in the last 72 hours. BMET No results for input(s): NA, K, CL, CO2, GLUCOSE, BUN, CREATININE, CALCIUM  in the last 72 hours. LFT No results for input(s): PROT, ALBUMIN, AST, ALT, ALKPHOS, BILITOT, BILIDIR, IBILI in the last 72 hours. PT/INR No results for input(s): LABPROT, INR in the last 72 hours.   Impression / Plan: This is a 49 y.o.female who presents for EGD/EUS to evaluate for pancreatic ductal dilation.  The risks of an EUS including intestinal perforation, bleeding, infection, aspiration, and medication effects were discussed as was the possibility it may not give a definitive diagnosis if a biopsy is performed.  When a biopsy of the pancreas is done as part of the EUS, there is an additional risk of pancreatitis at the rate of about 1-2%.  It was explained that procedure related pancreatitis is typically mild, although it can be severe and even life threatening, which is why we do not perform random pancreatic biopsies and only biopsy a lesion/area we feel is concerning enough to warrant the risk.   The risks and benefits of endoscopic evaluation/treatment were discussed with the patient and/or family; these include but are not limited to the risk of perforation, infection, bleeding, missed lesions, lack of diagnosis, severe illness requiring hospitalization, as well as anesthesia and sedation related illnesses.  The patient's history has been reviewed, patient examined, no change in status,  and deemed stable for procedure.  The patient and/or family was provided an opportunity to ask questions and all were answered.  The patient and/or family is agreeable to proceed.    Aloha Finner, MD [facility] Office # [phone]     [1]  Current Facility-Administered Medications:    0.9 %  sodium chloride  infusion, , Intravenous, Continuous, Mansouraty, Aloha Raddle., MD [2]  Allergies Allergen Reactions   Sibutramine Hcl Monohydrate Hives  Meridia   "

## 2024-07-11 NOTE — Op Note (Signed)
 [facility] Patient Name: Karen Brennan Procedure Date : 07/11/2024 MRN: [id number] Attending MD: Aloha Finner , MD, [id number] Date of Birth: 1975/07/28 CSN: [id number] Age: 49 Admit Type: Inpatient Procedure:                Upper EUS Indications:              Dilated pancreatic duct on CT scan, Dilated                            pancreatic duct on MRCP Providers:                Aloha Finner, MD, Gregoria Pierce, RN,                            Curtistine Bishop, Technician Referring MD:              Medicines:                Monitored Anesthesia Care Complications:            No immediate complications. Estimated Blood Loss:     Estimated blood loss: none. Estimated blood loss:                            none. Procedure:                Pre-Anesthesia Assessment:                           - Prior to the procedure, a History and Physical                            was performed, and patient medications and                            allergies were reviewed. The patient's tolerance of                            previous anesthesia was also reviewed. The risks                            and benefits of the procedure and the sedation                            options and risks were discussed with the patient.                            All questions were answered, and informed consent                            was obtained. Prior Anticoagulants: The patient has                            taken no anticoagulant or antiplatelet agents. ASA                            Grade Assessment:  III - A patient with severe                            systemic disease. After reviewing the risks and                            benefits, the patient was deemed in satisfactory                            condition to undergo the procedure.                           After obtaining informed consent, the endoscope was                            passed under direct vision. Throughout  the                            procedure, the patient's blood pressure, pulse, and                            oxygen saturations were monitored continuously. The                            [id number]-[id number] ([id number]) Olympus endoscope was introduced                            through the mouth, and advanced to the second part                            of duodenum. After obtaining informed consent, the                            endoscope was passed under direct vision.                            Throughout the procedure, the patient's blood                            pressure, pulse, and oxygen saturations were                            monitored continuously. The [id number] ([id number])                            Olympus endosonoscope was introduced through the                            mouth, and advanced to the duodenum for ultrasound                            examination from the esophagus, stomach and  jejunum. The upper EUS was accomplished without                            difficulty. The patient tolerated the procedure. Scope In: Scope Out: Findings:      ENDOSCOPIC FINDING: :      No gross lesions were noted in the entire esophagus.      The Z-line was regular and was found 42 cm from the incisors.      Evidence of a Roux-en-Y gastrojejunostomy was found. The gastrojejunal       anastomosis was characterized by healthy appearing mucosa. This was       traversed. The pouch-to-jejunum limb was characterized by healthy       appearing mucosa. The duodenum-to-jejunum limb was not examined as it       could not be found. The excluded stomach was not examined as it could       not be reached.      Normal mucosa was found in the visualized jejunum.      ENDOSONOGRAPHIC FINDING: :      Endosonographic imaging in the gastroesophageal junction showed no wall       thickening.      Endosonographic imaging in the entire gastric pouch showed no wall       thickening.       Endosonographic imaging in the examined duodenum showed no wall       thickening.      No malignant-appearing lymph nodes were visualized in the middle       paraesophageal mediastinum (level 64M) and lower paraesophageal       mediastinum (level 8L).      The esophagus, gastric remnant and surgically altered/placed jejunum       were examined endosonographically.      Unfortunately, even with trying to go into the jejunal loop, we could       not get any true visualization of the pancreas to evaluate the recently       noted change in the MRI-Abdomen. Impression:               EGD Impression:                           - No gross lesions in the entire esophagus. Z-line                            regular, 42 cm from the incisors.                           - Roux-en-Y gastrojejunostomy with gastrojejunal                            anastomosis characterized by healthy appearing                            mucosa.                           - Normal mucosa was found in the visualized jejunum.                           EUS Impression:                           -  Unfortunately, even with trying to go into the                            jejunal loop, we could not get any true                            visualization of the pancreas to evaluate the                            recently noted change in the MRI-Abdomen.                           - No malignant-appearing lymph nodes were                            visualized in the middle paraesophageal mediastinum                            (level 6M) and lower paraesophageal mediastinum                            (level 8L).                           - No specimens collected. Recommendation:           - The patient will be observed post-procedure,                            until all discharge criteria are met.                           - Discharge patient to home.                           - Resume previous diet.                           -  Observe patient's clinical course.                           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                           - Unfortunately, due to my availability in the next                            few weeks, this will not be possible here in [place].                           - I will communicate with primary GI to discuss                            next steps in evaluation attempt at procedure.                           - Continue present medications.                           - The findings and recommendations were discussed                            with the patient. Procedure Code(s):        --- Professional ---                           [phone], Esophagogastroduodenoscopy, flexible,                            transoral; with endoscopic ultrasound examination,                            including the esophagus, stomach, and either the                            duodenum or a surgically altered stomach where the                            jejunum is examined distal to the anastomosis Diagnosis Code(s):        ---  Professional ---                           Z98.0, Intestinal bypass and anastomosis status                           I89.9, Noninfective disorder of lymphatic vessels                             and lymph nodes, unspecified                           K86.89, Other specified diseases of pancreas CPT copyright 2022 American Medical Association. All rights reserved. The codes documented in this report are preliminary and upon coder review may  be revised to meet current compliance requirements. Aloha Finner, MD 07/11/2024 9:18:33 AM Number of Addenda: 0

## 2024-07-11 NOTE — Anesthesia Preprocedure Evaluation (Addendum)
"                                    Anesthesia Evaluation  Patient identified by MRN, date of birth, ID band Patient awake    Reviewed: Allergy & Precautions, NPO status , Patient's Chart, lab work & pertinent test results, reviewed documented beta blocker date and time   Airway Mallampati: II  TM Distance: >3 FB Neck ROM: Full    Dental  (+) Dental Advisory Given, Chipped,    Pulmonary asthma , former smoker   Pulmonary exam normal breath sounds clear to auscultation       Cardiovascular hypertension, Pt. on home beta blockers and Pt. on medications Normal cardiovascular exam Rhythm:Regular Rate:Normal  TTE 2025 1. Left ventricular ejection fraction, by estimation, is 60 to 65%. Left  ventricular ejection fraction by 3D volume is 64 %. The left ventricle has  normal function. The left ventricle has no regional wall motion  abnormalities. Left ventricular diastolic   parameters were normal.   2. Right ventricular systolic function is normal. The right ventricular  size is normal.   3. The mitral valve is normal in structure. Trivial mitral valve  regurgitation. No evidence of mitral stenosis.   4. The aortic valve is tricuspid. There is mild calcification of the  aortic valve. Aortic valve regurgitation is not visualized. Aortic valve  sclerosis/calcification is present, without any evidence of aortic  stenosis.   5. The inferior vena cava is normal in size with greater than 50%  respiratory variability, suggesting right atrial pressure of 3 mmHg.      Neuro/Psych  Headaches PSYCHIATRIC DISORDERS Anxiety Depression Bipolar Disorder Schizophrenia     GI/Hepatic Neg liver ROS,GERD  ,,  Endo/Other  diabetes, Type 2, Oral Hypoglycemic Agents  Class 3 obesity (BMI 45)  Renal/GU negative Renal ROS  negative genitourinary   Musculoskeletal negative musculoskeletal ROS (+)    Abdominal   Peds  Hematology negative hematology ROS (+)   Anesthesia Other  Findings   Reproductive/Obstetrics                              Anesthesia Physical Anesthesia Plan  ASA: 3  Anesthesia Plan: MAC   Post-op Pain Management:    Induction: Intravenous  PONV Risk Score and Plan: Propofol  infusion and Treatment may vary due to age or medical condition  Airway Management Planned: Natural Airway  Additional Equipment:   Intra-op Plan:   Post-operative Plan:   Informed Consent: I have reviewed the patients History and Physical, chart, labs and discussed the procedure including the risks, benefits and alternatives for the proposed anesthesia with the patient or authorized representative who has indicated his/her understanding and acceptance.     Dental advisory given  Plan Discussed with: CRNA  Anesthesia Plan Comments:          Anesthesia Quick Evaluation  "

## 2024-07-11 NOTE — Transfer of Care (Signed)
 Immediate Anesthesia Transfer of Care Note  Patient: Karen Brennan  Procedure(s) Performed: EGD (ESOPHAGOGASTRODUODENOSCOPY) ULTRASOUND, UPPER GI TRACT, ENDOSCOPIC  Patient Location: Endoscopy Unit  Anesthesia Type:MAC  Level of Consciousness: awake, drowsy, and patient cooperative  Airway & Oxygen Therapy: Patient Spontanous Breathing  Post-op Assessment: Report given to RN and Post -op Vital signs reviewed and stable  Post vital signs: Reviewed and stable  Last Vitals:  Vitals Value Taken Time  BP    Temp    Pulse 74 07/11/24 09:10  Resp 13 07/11/24 09:10  SpO2 99 % 07/11/24 09:10  Vitals shown include unfiled device data.  Last Pain:  Vitals:   07/11/24 0814  TempSrc: Temporal         Complications: No notable events documented.

## 2024-07-13 ENCOUNTER — Encounter (HOSPITAL_COMMUNITY): Payer: Self-pay | Admitting: Gastroenterology

## 2024-07-14 ENCOUNTER — Encounter: Payer: Self-pay | Admitting: Family Medicine

## 2024-07-14 NOTE — Anesthesia Postprocedure Evaluation (Signed)
"   Anesthesia Post Note  Patient: Karen Brennan  Procedure(s) Performed: EGD (ESOPHAGOGASTRODUODENOSCOPY) ULTRASOUND, UPPER GI TRACT, ENDOSCOPIC     Patient location during evaluation: Endoscopy Anesthesia Type: MAC Level of consciousness: awake and alert Pain management: pain level controlled Vital Signs Assessment: post-procedure vital signs reviewed and stable Respiratory status: spontaneous breathing, nonlabored ventilation, respiratory function stable and patient connected to nasal cannula oxygen Cardiovascular status: blood pressure returned to baseline and stable Postop Assessment: no apparent nausea or vomiting Anesthetic complications: no   No notable events documented.  Last Vitals:  Vitals:   07/11/24 0920 07/11/24 0930  BP: 129/85 129/88  Pulse: 73 73  Resp: (!) 25 16  Temp:    SpO2: 97% 100%    Last Pain:  Vitals:   07/11/24 0930  TempSrc:   PainSc: 0-No pain                 Shylin Keizer L Symphanie Cederberg      "

## 2024-07-16 ENCOUNTER — Other Ambulatory Visit: Payer: Self-pay

## 2024-07-16 ENCOUNTER — Telehealth: Payer: Self-pay

## 2024-07-16 DIAGNOSIS — Q453 Other congenital malformations of pancreas and pancreatic duct: Secondary | ICD-10-CM

## 2024-07-16 DIAGNOSIS — K869 Disease of pancreas, unspecified: Secondary | ICD-10-CM

## 2024-07-16 NOTE — Telephone Encounter (Signed)
 MRI/MRCP was ordered for pt to be done at [facility]. Pt was made aware that there office will contact the pt and if she has not heard from them in 1-2 weeks to give our office a call back.  Referral along with pt records was sent to [facility] for EDGE consideration to evaluate pancreatic lesion and consideration of EDGE procedure with follow up EUS.  Fax sent to [phone]: Pt was made aware that there office will contact the pt and if she has not heard from them in 1-2 weeks to give our office a call back.  Pt verbalized understanding with all questions answered.

## 2024-07-16 NOTE — Telephone Encounter (Signed)
-----   Message from Rosario Kidney, MD sent at 07/12/2024  6:40 AM EST ----- Regarding: RE: Followup Got it, thanks Gabe. Pod B triage, agree with Dr. Christopher recs, let's please arrange for MRI abd/MRCP w/wo contrast in 2-3 weeks and refer to [facility] please for consideration of EDGE procedure with follow up EUS. ----- Message ----- From: Wilhelmenia Aloha Raddle., MD Sent: 07/11/2024  11:02 PM EST To: Rosario JAYSON Kidney, MD; [facility] Pod B Triage Subject: Followup                                       CD, Please see EUS report. Her anatomy precludes me from looking at the pancreatic lesion that was found. She will need to have an EDGE be considered for attempt at further sampling. Recommend MRCP be completed with repeat MR-Abdomen within the next few weeks. Due to changes in schedule, I will not be available for this in the coming weeks. She should be referred to [facility] for EDGE consideration to evaluate pancreatic lesion. She is OK for [employer], but preference for her due to proximity is [facility]. Sorry I cannot be of further assistance at this time. GM

## 2024-07-21 ENCOUNTER — Ambulatory Visit: Payer: Self-pay | Admitting: Family Medicine

## 2024-07-22 ENCOUNTER — Telehealth

## 2024-07-29 ENCOUNTER — Telehealth: Admitting: Licensed Clinical Social Worker

## 2024-08-08 ENCOUNTER — Telehealth: Payer: Self-pay | Admitting: *Deleted

## 2024-08-16 ENCOUNTER — Other Ambulatory Visit

## 2024-12-23 ENCOUNTER — Ambulatory Visit: Admitting: Podiatry
